# Patient Record
Sex: Female | Born: 1965
Health system: Southern US, Community
[De-identification: ages and names within clinical notes are randomized; demographics above are authoritative.]

## PROBLEM LIST (undated history)

## (undated) DIAGNOSIS — Z8489 Family history of other specified conditions: Secondary | ICD-10-CM

## (undated) DIAGNOSIS — Z87442 Personal history of urinary calculi: Secondary | ICD-10-CM

## (undated) DIAGNOSIS — Z801 Family history of malignant neoplasm of trachea, bronchus and lung: Secondary | ICD-10-CM

## (undated) DIAGNOSIS — M7989 Other specified soft tissue disorders: Secondary | ICD-10-CM

## (undated) DIAGNOSIS — Z8042 Family history of malignant neoplasm of prostate: Secondary | ICD-10-CM

## (undated) DIAGNOSIS — Z9221 Personal history of antineoplastic chemotherapy: Secondary | ICD-10-CM

## (undated) DIAGNOSIS — K829 Disease of gallbladder, unspecified: Secondary | ICD-10-CM

## (undated) DIAGNOSIS — Z808 Family history of malignant neoplasm of other organs or systems: Secondary | ICD-10-CM

## (undated) DIAGNOSIS — G459 Transient cerebral ischemic attack, unspecified: Secondary | ICD-10-CM

## (undated) DIAGNOSIS — R0602 Shortness of breath: Secondary | ICD-10-CM

## (undated) DIAGNOSIS — R519 Headache, unspecified: Secondary | ICD-10-CM

## (undated) DIAGNOSIS — M25559 Pain in unspecified hip: Secondary | ICD-10-CM

## (undated) DIAGNOSIS — Z8719 Personal history of other diseases of the digestive system: Secondary | ICD-10-CM

## (undated) DIAGNOSIS — Z803 Family history of malignant neoplasm of breast: Secondary | ICD-10-CM

## (undated) DIAGNOSIS — E7800 Pure hypercholesterolemia, unspecified: Secondary | ICD-10-CM

## (undated) DIAGNOSIS — M1990 Unspecified osteoarthritis, unspecified site: Secondary | ICD-10-CM

## (undated) DIAGNOSIS — Z972 Presence of dental prosthetic device (complete) (partial): Secondary | ICD-10-CM

## (undated) DIAGNOSIS — Z9889 Other specified postprocedural states: Secondary | ICD-10-CM

## (undated) DIAGNOSIS — R7303 Prediabetes: Secondary | ICD-10-CM

## (undated) DIAGNOSIS — G4733 Obstructive sleep apnea (adult) (pediatric): Secondary | ICD-10-CM

## (undated) DIAGNOSIS — Z800 Family history of malignant neoplasm of digestive organs: Secondary | ICD-10-CM

## (undated) DIAGNOSIS — K219 Gastro-esophageal reflux disease without esophagitis: Secondary | ICD-10-CM

## (undated) DIAGNOSIS — D649 Anemia, unspecified: Secondary | ICD-10-CM

## (undated) DIAGNOSIS — I10 Essential (primary) hypertension: Secondary | ICD-10-CM

## (undated) DIAGNOSIS — R112 Nausea with vomiting, unspecified: Secondary | ICD-10-CM

## (undated) HISTORY — DX: Disease of gallbladder, unspecified: K82.9

## (undated) HISTORY — DX: Prediabetes: R73.03

## (undated) HISTORY — DX: Shortness of breath: R06.02

## (undated) HISTORY — DX: Gastro-esophageal reflux disease without esophagitis: K21.9

## (undated) HISTORY — DX: Family history of malignant neoplasm of prostate: Z80.42

## (undated) HISTORY — DX: Essential (primary) hypertension: I10

## (undated) HISTORY — DX: Family history of malignant neoplasm of digestive organs: Z80.0

## (undated) HISTORY — DX: Family history of malignant neoplasm of breast: Z80.3

## (undated) HISTORY — DX: Family history of malignant neoplasm of other organs or systems: Z80.8

## (undated) HISTORY — DX: Anemia, unspecified: D64.9

## (undated) HISTORY — PX: JOINT REPLACEMENT: SHX530

## (undated) HISTORY — PX: EYE SURGERY: SHX253

## (undated) HISTORY — DX: Pain in unspecified hip: M25.559

## (undated) HISTORY — DX: Family history of malignant neoplasm of trachea, bronchus and lung: Z80.1

## (undated) HISTORY — PX: CARPAL TUNNEL RELEASE: SHX101

---

## 1998-10-01 HISTORY — PX: LASIK: SHX215

## 1999-10-02 DIAGNOSIS — S060X9A Concussion with loss of consciousness of unspecified duration, initial encounter: Secondary | ICD-10-CM

## 1999-10-02 DIAGNOSIS — S060XAA Concussion with loss of consciousness status unknown, initial encounter: Secondary | ICD-10-CM

## 1999-10-02 HISTORY — DX: Concussion with loss of consciousness status unknown, initial encounter: S06.0XAA

## 1999-10-02 HISTORY — DX: Concussion with loss of consciousness of unspecified duration, initial encounter: S06.0X9A

## 2001-04-14 ENCOUNTER — Ambulatory Visit (HOSPITAL_COMMUNITY): Admission: RE | Admit: 2001-04-14 | Discharge: 2001-04-14 | Payer: Self-pay | Admitting: Pulmonary Disease

## 2001-04-21 ENCOUNTER — Other Ambulatory Visit: Admission: RE | Admit: 2001-04-21 | Discharge: 2001-04-21 | Payer: Self-pay | Admitting: Dermatology

## 2009-03-12 ENCOUNTER — Emergency Department (HOSPITAL_COMMUNITY): Admission: EM | Admit: 2009-03-12 | Discharge: 2009-03-12 | Payer: Self-pay | Admitting: Emergency Medicine

## 2010-10-23 ENCOUNTER — Emergency Department (HOSPITAL_COMMUNITY)
Admission: EM | Admit: 2010-10-23 | Discharge: 2010-10-23 | Payer: Self-pay | Source: Home / Self Care | Admitting: Emergency Medicine

## 2010-10-24 LAB — URINALYSIS, ROUTINE W REFLEX MICROSCOPIC
Ketones, ur: NEGATIVE mg/dL
Leukocytes, UA: NEGATIVE
Urine Glucose, Fasting: NEGATIVE mg/dL
pH: 6 (ref 5.0–8.0)

## 2010-10-24 LAB — BASIC METABOLIC PANEL
Calcium: 8.9 mg/dL (ref 8.4–10.5)
GFR calc Af Amer: >60 mL/min (ref >60)
GFR calc non Af Amer: 57 mL/min — ABNORMAL LOW (ref >60)
Sodium: 138 mEq/L (ref 135–145)

## 2010-10-24 LAB — DIFFERENTIAL
Basophils Absolute: 0 10*3/uL (ref 0.0–0.1)
Basophils Relative: 0 % (ref 0–1)
Eosinophils Absolute: 0.1 10*3/uL (ref 0.0–0.7)
Eosinophils Relative: 1 % (ref 0–5)
Monocytes Absolute: 0.5 10*3/uL (ref 0.1–1.0)
Neutro Abs: 9.9 10*3/uL — ABNORMAL HIGH (ref 1.7–7.7)

## 2010-10-24 LAB — CBC
MCHC: 33 g/dL (ref 30.0–36.0)
Platelets: 236 10*3/uL (ref 150–400)
RDW: 15.4 % (ref 11.5–15.5)

## 2010-10-24 LAB — URINE MICROSCOPIC-ADD ON

## 2010-11-09 ENCOUNTER — Observation Stay (HOSPITAL_COMMUNITY)
Admission: AD | Admit: 2010-11-09 | Discharge: 2010-11-10 | DRG: 814 | Disposition: A | Discharge status: Home / Self Care | Payer: BC Managed Care – PPO | Source: Ambulatory Visit | Attending: General Surgery | Admitting: General Surgery

## 2010-11-09 DIAGNOSIS — R112 Nausea with vomiting, unspecified: Secondary | ICD-10-CM | POA: Diagnosis present

## 2010-11-09 DIAGNOSIS — R109 Unspecified abdominal pain: Principal | ICD-10-CM | POA: Diagnosis present

## 2010-11-09 LAB — HEPATIC FUNCTION PANEL
ALT: 13 U/L (ref 0–35)
AST: 17 U/L (ref 0–37)
Albumin: 3.7 g/dL (ref 3.5–5.2)
Bilirubin, Direct: 0.1 mg/dL (ref 0.0–0.3)

## 2010-11-09 LAB — DIFFERENTIAL
Basophils Absolute: 0 10*3/uL (ref 0.0–0.1)
Basophils Relative: 0 % (ref 0–1)
Lymphocytes Relative: 9 % — ABNORMAL LOW (ref 12–46)
Neutro Abs: 13.5 10*3/uL — ABNORMAL HIGH (ref 1.7–7.7)

## 2010-11-09 LAB — BASIC METABOLIC PANEL
CO2: 26 mEq/L (ref 19–32)
GFR calc Af Amer: >60 mL/min (ref >60)
GFR calc non Af Amer: 59 mL/min — ABNORMAL LOW (ref >60)
Glucose, Bld: 101 mg/dL — ABNORMAL HIGH (ref 70–99)
Potassium: 3.9 mEq/L (ref 3.5–5.1)
Sodium: 139 mEq/L (ref 135–145)

## 2010-11-09 LAB — CBC
HCT: 38.1 % (ref 36.0–46.0)
Hemoglobin: 12.9 g/dL (ref 12.0–15.0)
RDW: 15.2 % (ref 11.5–15.5)
WBC: 16.4 10*3/uL — ABNORMAL HIGH (ref 4.0–10.5)

## 2010-11-10 LAB — BASIC METABOLIC PANEL
BUN: 10 mg/dL (ref 6–23)
CO2: 25 mEq/L (ref 19–32)
Calcium: 8.8 mg/dL (ref 8.4–10.5)
Creatinine, Ser: 1.1 mg/dL (ref 0.4–1.2)
GFR calc non Af Amer: 54 mL/min — ABNORMAL LOW (ref >60)
Glucose, Bld: 81 mg/dL (ref 70–99)

## 2010-11-10 LAB — CBC
HCT: 33 % — ABNORMAL LOW (ref 36.0–46.0)
Hemoglobin: 11.2 g/dL — ABNORMAL LOW (ref 12.0–15.0)
MCH: 25.6 pg — ABNORMAL LOW (ref 26.0–34.0)
MCHC: 33.9 g/dL (ref 30.0–36.0)
MCV: 75.5 fL — ABNORMAL LOW (ref 78.0–100.0)
RDW: 15.5 % (ref 11.5–15.5)

## 2010-11-10 LAB — DIFFERENTIAL
Basophils Absolute: 0 10*3/uL (ref 0.0–0.1)
Eosinophils Relative: 2 % (ref 0–5)
Lymphocytes Relative: 26 % (ref 12–46)
Lymphs Abs: 2.6 10*3/uL (ref 0.7–4.0)
Monocytes Absolute: 1.1 10*3/uL — ABNORMAL HIGH (ref 0.1–1.0)
Monocytes Relative: 11 % (ref 3–12)
Neutro Abs: 6.1 10*3/uL (ref 1.7–7.7)

## 2010-12-28 ENCOUNTER — Other Ambulatory Visit: Payer: Self-pay | Admitting: General Surgery

## 2010-12-28 ENCOUNTER — Encounter (HOSPITAL_COMMUNITY): Payer: BC Managed Care – PPO

## 2010-12-28 LAB — CBC
Hemoglobin: 11.6 g/dL — ABNORMAL LOW (ref 12.0–15.0)
MCH: 24.9 pg — ABNORMAL LOW (ref 26.0–34.0)
MCHC: 31.8 g/dL (ref 30.0–36.0)
RDW: 14.7 % (ref 11.5–15.5)

## 2010-12-28 LAB — SURGICAL PCR SCREEN
MRSA, PCR: NEGATIVE
Staphylococcus aureus: NEGATIVE

## 2010-12-28 LAB — BASIC METABOLIC PANEL
CO2: 27 mEq/L (ref 19–32)
Calcium: 8.8 mg/dL (ref 8.4–10.5)
Creatinine, Ser: 0.96 mg/dL (ref 0.4–1.2)
GFR calc Af Amer: >60 mL/min (ref >60)
GFR calc non Af Amer: >60 mL/min (ref >60)
Sodium: 140 mEq/L (ref 135–145)

## 2010-12-31 HISTORY — PX: LAPAROSCOPIC CHOLECYSTECTOMY: SUR755

## 2011-01-03 ENCOUNTER — Ambulatory Visit (HOSPITAL_COMMUNITY)
Admission: RE | Admit: 2011-01-03 | Discharge: 2011-01-03 | Disposition: A | Discharge status: Home / Self Care | Payer: BC Managed Care – PPO | Source: Ambulatory Visit | Attending: General Surgery | Admitting: General Surgery

## 2011-01-03 ENCOUNTER — Other Ambulatory Visit: Payer: Self-pay | Admitting: General Surgery

## 2011-01-03 DIAGNOSIS — K8010 Calculus of gallbladder with chronic cholecystitis without obstruction: Secondary | ICD-10-CM | POA: Insufficient documentation

## 2011-01-08 LAB — URINE MICROSCOPIC-ADD ON

## 2011-01-08 LAB — URINALYSIS, ROUTINE W REFLEX MICROSCOPIC
Bilirubin Urine: NEGATIVE
Glucose, UA: NEGATIVE mg/dL
Specific Gravity, Urine: >1.03 — ABNORMAL HIGH (ref 1.005–1.030)
Urobilinogen, UA: 0.2 mg/dL (ref 0.0–1.0)

## 2011-01-09 NOTE — Op Note (Signed)
NAME:  Leslie Wright, Leslie Wright                ACCOUNT NO.:  [id number]  MEDICAL RECORD NO.:  [id number]           PATIENT TYPE:  O  LOCATION:  DAYP                          FACILITY:  [facility]  PHYSICIAN:  Tilford Pillar, MD      DATE OF BIRTH:  06/24/1966  DATE OF PROCEDURE:  01/03/2011 DATE OF DISCHARGE:                              OPERATIVE REPORT   PREOPERATIVE DIAGNOSIS:  Chronic cholecystitis.  POSTOPERATIVE DIAGNOSIS:  Hydrops of the gallbladder.  PROCEDURE:  Laparoscopic cholecystectomy.  SURGEON:  Tilford Pillar, MD.  ANESTHESIA:  General endotracheal, local anesthetic 0.5% Sensorcaine plain.  SPECIMEN:  Gallbladder.  ESTIMATED BLOOD LOSS:  Minimal.  INDICATIONS:  The patient is a 45 year old female, who presented to my office with history of epigastric and right upper quadrant abdominal pain.  Workup was consistent for cholecystitis.  After _____ the patient was scheduled for a laparoscopic and possible open cholecystectomy.Risks, benefits and alternatives were discussed at length with the patient including but not limited to risk of bleeding, infection, bile leak, small bowel injury, common bile duct injury as well as the possibility of intraoperative cardiac and pulmonary events.  Her questions and concerns were addressed and the patient was consented for planned procedure.  OPERATION:  The patient was taken to the operating room, placed in the supine position on the operating table, at which time, general anesthetic was administered.  Once the patient was asleep, she was endotracheally intubated by Anesthesia.  At this point, her abdomen was prepped with DuraPrep solution and draped in standard fashion.  At this point, a stab incision was created supraumbilically with a 11-blade scalpel.  Additional dissection down through the subcuticular tissue was carried out using a Kocher clamp, which was utilized to grasp the anterior abdominal fascia with this anteriorly.  A Veress  needle was inserted.  Saline drop test was utilized to confirm the intraperitoneal placement and then pneumoperitoneum was initiated.  Once sufficient pneumoperitoneum was obtained, an 11-mm trocar was inserted over the laparoscope allowing visualization of the trocar entering into the peritoneum cavity.  At this time, the inner cannula was removed.  The laparoscope was reinserted.  There was no evidence of any trocar or Veress needle placement injury.  At this time, the remaining trocars were placed with a 11-mm trocar in the epigastrium, the 5-mm trocar in the midline between the two 11-mm trocars, and a 5-mm trocar in the right lateral abdominal wall.  The patient was placed into a reverse Trendelenburg left lateral decubitus position.  The fundus of the gallbladder was grasped and lifted up and over the right lobe of the liver.  The infundibulum was identified.  The peritoneal reflection onto the infundibulum was bluntly stripped using a [place], which exposing the cystic duct and the cystic artery entering into theinfundibulum.  Three EndoClips were placed proximally, one distally on the cystic duct.  Two EndoClips were placed proximally, one distally, and the cystic artery and both structures were divided between the two most distal clips.  At this time, electrocautery was utilized to dissect the gallbladder free from the gallbladder fossa.  During the dissection,  I did identify the posterior branch of the cystic artery, this was additionally clipped with two EndoClips, and the gallbladder was freed and removed from the gallbladder fossa.  During the dissection, a small cholecystotomy was created which did demonstrate clear thick drainage from the gallbladder consistent with hydrops of the gallbladder.  This was quickly controlled and aspirated using a suction aspirator.  Once the gallbladder was free, it was placed into an EndoCatch bag.  I then copiously irrigated the  surgical field.  Hemostasis was excellent within the gallbladder fossa and at this time attention was turned to closure.  Using an Endoclose suture passing device, a 2-0 Vicryl suture was passed through both the 11-mm trocar sites with an Endoclose suture passing device.  With the sutures in place, the gallbladder was retrieved and removed through the umbilical trocar site.  Some blunt dilatation was required to enlarge the umbilical site large enough to remove the gallbladder.  The gallbladder was freed and then intact.  EndoCatch bag was placed on the back table and sent as a permanent specimen.  At this point, the pneumoperitoneum was evacuated.  The trocars were removed. The Vicryl sutures were secured.  The local anesthetic was instilled and all four trocar sites were closed with a 4-0 Monocryl and running subcuticular suture.  The skin was washed and dried with a moist and dry towel.  Benzoin was applied around the incision.  Half-inch Steri-Strips were placed.  The drapes removed and the patient was allowed to come out of general anesthetic and was transferred back to a regular hospital bed in stable condition.  At the conclusion of procedure, all instruments, sponge, and needle counts were correct.  The patient tolerated the procedure extremely well.     Tilford Pillar, MD     BZ/MEDQ  D:  01/03/2011  T:  01/04/2011  Job:  [id number]  Electronically Signed by Tilford Pillar MD on 01/09/2011 10:39:10 PM

## 2011-01-09 NOTE — H&P (Signed)
NAME:  Leslie Wright, Leslie Wright                ACCOUNT NO.:  [id number]  MEDICAL RECORD NO.:  [id number]           PATIENT TYPE:  O  LOCATION:  DAY                           FACILITY:  [facility]  PHYSICIAN:  Tilford Pillar, MD      DATE OF BIRTH:  1966-06-13  DATE OF ADMISSION: DATE OF DISCHARGE:  LH                             HISTORY & PHYSICAL   CHIEF COMPLAINT:  Right upper quadrant abdominal pain.  HISTORY OF PRESENT ILLNESS:  The patient is a 45 year old female who initially presented to my office from referral from [facility] primary care who has increasing episodes of nausea and vomiting.  She denies any fever or chills.  No diarrhea.  She stated she has bands of constant pressure, this is subcostal and orientation, it improves with emesis. She has had no change in bowel movements.  No melena.  No hematochezia. She has not described acholic stools.  She has had no change in urination.  She denies any jaundice.  No significant family history of biliary disease.  PAST MEDICAL HISTORY:  None.  PAST SURGICAL HISTORY:  STR procedure.  MEDICATIONS:  None.  ALLERGIES:  NO KNOWN DRUG ALLERGIES.  SOCIAL HISTORY:  No tobacco.  No alcohol.  No recreational drug abuse. 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  PERTINENT FAMILY HISTORY:  Diabetes mellitus, coronary artery disease.  REVIEW OF SYSTEMS:  Unremarkable and all systems other than gastrointestinal which is as per HPI, otherwise, Constitutional, Eyes, Ears, Nose, Throat, Respiratory, Cardiovascular, Genitourinary, Musculoskeletal, Skin, Endocrine, and Neuro are all unremarkable.  PHYSICAL EXAMINATION:  GENERAL:  The patient is an obese female.  She is comfortable in appearance, but is not in any acute distress.  She is alert and oriented x3. HEENT:  Scalp, no deformities or masses.  Eyes: Pupils equal, round, reactive to light.  Extraocular movements are intact.  No scleral icterus.  No  conjunctival pallor is noted.  Oral mucosa pink.  Normal occlusion. NECK:  Trachea is midline.  No thyroid nodularity or goiter.  No cervical lymphadenopathy. PULMONARY:  Unlabored respiration.  She is clear to auscultation bilaterally. CARDIOVASCULAR:  Regular rate and rhythm.  No murmurs, rubs, or gallop. She has 2+ radial femoral pulses bilaterally. ABDOMEN:  Positive bowel sounds.  Abdomen is soft.  She is moderately tender in the epigastrium.  No hernias or masses. SKIN:  Warm and dry.  PERTINENT LABORATORY AND RADIOGRAPHIC STUDIES:  CT of the abdomen and pelvis demonstrated positive stones within the biliary tree.  No biliary tree dilatation was noted.  ASSESSMENT/PLAN:  Cholelithiasis.  At this time, risks, benefits, and alternatives of the laparoscopic possible open cholecystectomy were discussed in length with the patient including, but not limited to risk of bleeding, infection, bile leak, small-bowel injury, common bile duct injury as well as the possibility of intraoperative cardiac and pulmonary events.  At this point, she will be scheduled at her earliest convenience.     Tilford Pillar, MD     BZ/MEDQ  D:  01/02/2011  T:  01/02/2011  Job:  [id number]  Electronically Signed by Tilford Pillar  MD on 01/09/2011 10:39:01 PM

## 2011-03-13 ENCOUNTER — Other Ambulatory Visit: Payer: Self-pay | Admitting: Obstetrics and Gynecology

## 2016-09-06 ENCOUNTER — Encounter (INDEPENDENT_AMBULATORY_CARE_PROVIDER_SITE_OTHER): Payer: Self-pay | Admitting: Family Medicine

## 2016-09-27 ENCOUNTER — Encounter (INDEPENDENT_AMBULATORY_CARE_PROVIDER_SITE_OTHER): Payer: Self-pay | Admitting: Family Medicine

## 2016-09-27 ENCOUNTER — Ambulatory Visit (INDEPENDENT_AMBULATORY_CARE_PROVIDER_SITE_OTHER): Payer: 59 | Admitting: Family Medicine

## 2016-09-27 VITALS — BP 135/87 | HR 64 | Temp 97.9°F | Resp 18 | Ht 66.0 in | Wt 287.6 lb

## 2016-09-27 DIAGNOSIS — R5383 Other fatigue: Secondary | ICD-10-CM

## 2016-09-27 DIAGNOSIS — R9431 Abnormal electrocardiogram [ECG] [EKG]: Secondary | ICD-10-CM

## 2016-09-27 DIAGNOSIS — R7303 Prediabetes: Secondary | ICD-10-CM

## 2016-09-27 DIAGNOSIS — Z1389 Encounter for screening for other disorder: Secondary | ICD-10-CM | POA: Diagnosis not present

## 2016-09-27 DIAGNOSIS — R0602 Shortness of breath: Secondary | ICD-10-CM | POA: Diagnosis not present

## 2016-09-27 DIAGNOSIS — Z1331 Encounter for screening for depression: Secondary | ICD-10-CM

## 2016-09-27 DIAGNOSIS — Z9189 Other specified personal risk factors, not elsewhere classified: Secondary | ICD-10-CM

## 2016-09-27 DIAGNOSIS — Z0289 Encounter for other administrative examinations: Secondary | ICD-10-CM

## 2016-09-27 NOTE — Progress Notes (Signed)
Office: [phone]  /  Fax: [phone]   HPI:   Chief Complaint: OBESITY  Leslie Wright (MR# [id number]) is a 50 y.o. female who presents on 09/27/2016 for obesity evaluation and treatment. Current BMI is Body mass index is 46.42 kg/m.Leslie Wright has struggled with obesity for years and has been unsuccessful in either losing weight or maintaining long term weight loss. Leslie Wright has lost and gained weight many times, felt she did best on HCG 500 calorie a day diet but has gained all her weight back. Legna attended our information session and states she is currently in the action stage of change and ready to dedicate time achieving and maintaining a healthier weight.  Analaya states her family eats meals together her desired weight is 180 she has been heavy most of  her life she started gaining weight last year. her heaviest weight ever was 312 lbs. she has significant food cravings issues  she is frequently drinking liquids with calories she frequently makes poor food choices she has problems with excessive hunger  she frequently eats larger portions than normal  she has binge eating behaviors she struggles with emotional eating    Fatigue with exertion Leslie Wright feels her energy is lower than it should be. This has worsened with weight gain and has not worsened recently. Leslie Wright admits to daytime somnolence and  admits to waking up still tired. Patient is at risk for obstructive sleep apnea. Patent has a history of symptoms of daytime fatigue and Epworth sleepiness scale. Patient generally gets 6 hours of sleep per night, and states they generally have generally restful sleep. Snoring is present. Apneic episodes are present. Epworth Sleepiness Score is 9.  Dyspnea on exertion Leslie Wright notes increasing shortness of breath with exercising and seems to be worsening over time with weight gain. She notes getting out of breath sooner with activity than she used to. This has not gotten worse  recently. Jaella denies orthopnea.  Pre-Diabetes Tomasa previously diagnosed with pre-diabetes and was informed this puts her at greater risk of developing diabetes. Positive family history of diabetes, no recent A1c. She is not taking metformin currently and continues to work on diet and exercise to decrease risk of diabetes. She denies nausea or hypoglycemia.  Abnormal EKG Poor R wave progression, may be secondary to pulmonary disease, Non Specific T-abnormality. Consider old anterior infarction.  Depression Screen Leslie Wright's Food and Mood (modified PHQ-9) score was  Depression screen PHQ 2/9 09/27/2016  Decreased Interest 3  Down, Depressed, Hopeless 3  PHQ - 2 Score 6  Altered sleeping 2  Tired, decreased energy 3  Change in appetite 3  Feeling bad or failure about yourself  2  Trouble concentrating 1  Moving slowly or fidgety/restless 0  Suicidal thoughts 0  PHQ-9 Score 17    ALLERGIES: Allergies not on file  MEDICATIONS: No current outpatient prescriptions on file prior to visit.   No current facility-administered medications on file prior to visit.     PAST MEDICAL HISTORY: Past Medical History:  Diagnosis Date  . Acid reflux   . Anemia   . Back pain   . Gallbladder problem   . High blood pressure   . Hip pain   . Pre-diabetes   . Shortness of breath   . Sleep apnea   . Swelling     PAST SURGICAL HISTORY: Past Surgical History:  Procedure Laterality Date  . CARPAL TUNNEL RELEASE    . GALLBLADDER SURGERY     April 2012  .  LASIK     2000    SOCIAL HISTORY: Social History  Substance Use Topics  . Smoking status: Never Smoker  . Smokeless tobacco: Never Used  . Alcohol use Not on file    FAMILY HISTORY: Family History  Problem Relation Age of Onset  . Diabetes Mother   . Thyroid disease Mother   . Liver disease Mother   . Obesity Mother   . Hypertension Father   . Hyperlipidemia Father   . Heart disease Father   . Stroke Father   .  Obesity Father     ROS: Review of Systems  Constitutional: Positive for malaise/fatigue.  HENT: Positive for congestion, hearing loss, sinus pain and tinnitus.   Eyes:       Vision Changes  Respiratory: Positive for shortness of breath and wheezing.   Cardiovascular: Positive for claudication.       Leg Cramping  Gastrointestinal: Positive for heartburn.       Difficulty Swallowing  Genitourinary: Positive for frequency.  Musculoskeletal: Positive for back pain, joint pain and myalgias.       Muscle Stiffness  Skin:       Hair or Nail Changes  Endo/Heme/Allergies: Positive for polydipsia.       Polyphagia    PHYSICAL EXAM: Blood pressure 135/87, pulse 64, temperature 97.9 F (36.6 C), temperature source Oral, resp. rate 18, height 5' 6"  (1.676 m), weight 287 lb 9.6 oz (130.5 kg), last menstrual period 09/12/2016, SpO2 98 %. Body mass index is 46.42 kg/m. Physical Exam  Constitutional: She is oriented to person, place, and time. She appears well-developed and well-nourished.  HENT:  Head: Normocephalic and atraumatic.  Nose: Nose normal.  Eyes: EOM are normal. No scleral icterus.  Neck: Normal range of motion. Neck supple. No thyromegaly present.  Cardiovascular: Normal rate and regular rhythm.   Pulmonary/Chest: No respiratory distress.  Abdominal: Soft. There is no tenderness.  Obesity  Musculoskeletal: Normal range of motion. She exhibits edema.  Neurological: She is alert and oriented to person, place, and time. Coordination normal.  Skin: Skin is warm and dry.  Psychiatric: She has a normal mood and affect. Her behavior is normal.    RECENT LABS AND TESTS: BMET    Component Value Date/Time   NA 140 12/28/2010 1530   K 4.5 12/28/2010 1530   CL 105 12/28/2010 1530   CO2 27 12/28/2010 1530   GLUCOSE 94 12/28/2010 1530   BUN 6 12/28/2010 1530   CREATININE 0.96 12/28/2010 1530   CALCIUM 8.8 12/28/2010 1530   GFRNONAA >60 12/28/2010 1530   GFRAA  12/28/2010  1530    >60        The eGFR has been calculated using the MDRD equation. This calculation has not been validated in all clinical situations. eGFR's persistently <60 mL/min signify possible Chronic Kidney Disease.   No results found for: HGBA1C No results found for: INSULIN CBC    Component Value Date/Time   WBC 9.1 12/28/2010 1530   RBC 4.66 12/28/2010 1530   HGB 11.6 (L) 12/28/2010 1530   HCT 36.5 12/28/2010 1530   PLT 193 12/28/2010 1530   MCV 78.3 12/28/2010 1530   MCH 24.9 (L) 12/28/2010 1530   MCHC 31.8 12/28/2010 1530   RDW 14.7 12/28/2010 1530   LYMPHSABS 2.6 11/10/2010 0554   MONOABS 1.1 (H) 11/10/2010 0554   EOSABS 0.2 11/10/2010 0554   BASOSABS 0.0 11/10/2010 0554   Iron/TIBC/Ferritin/ %Sat No results found for: IRON, TIBC, FERRITIN, IRONPCTSAT Lipid  Panel  No results found for: CHOL, TRIG, HDL, CHOLHDL, VLDL, LDLCALC, LDLDIRECT Hepatic Function Panel     Component Value Date/Time   PROT 7.5 11/09/2010 1645   ALBUMIN 3.7 11/09/2010 1645   AST 17 11/09/2010 1645   ALT 13 11/09/2010 1645   ALKPHOS 55 11/09/2010 1645   BILITOT 0.5 11/09/2010 1645   BILIDIR 0.1 11/09/2010 1645   IBILI 0.4 11/09/2010 1645   No results found for: TSH  ECG  shows NSR with a rate of 64 INDIRECT CALORIMETER done today shows a VO2 of 229 and a REE of 1597.    ASSESSMENT AND PLAN: Other fatigue - Plan: EKG 12-Lead, Comprehensive metabolic panel, CBC With Differential, Lipid Panel With LDL/HDL Ratio, VITAMIN D 25 Hydroxy (Vit-D Deficiency, Fractures), Vitamin B12, Folate, T3, T4, free, TSH  Shortness of breath on exertion  Prediabetes - Plan: Hemoglobin A1c, Insulin, random  Nonspecific abnormal electrocardiogram (ECG) (EKG) - Plan: EKG 12-Lead, ECHOCARDIOGRAM COMPLETE  At risk for heart disease  Depression screening  Morbid obesity ([place])  PLAN:  Fatigue Avonelle was informed that her fatigue may be related to obesity, depression or many other causes. Labs will be  ordered, and in the meanwhile Kevonna has agreed to work on diet, exercise and weight loss to help with fatigue. Proper sleep hygiene was discussed including the need for 7-8 hours of quality sleep each night. A sleep study was not ordered based on symptoms and Epworth score.  Exercise Induced Shortness of Breath Candid's shortness of breath appears to be obesity related and exercise induced. She has agreed to work on weight loss and gradually increase exercise to treat her exercise induced shortness of breath. If Moira follows our instructions and loses weight without improvement of her shortness of breath, we will plan to refer to pulmonology. We will monitor this condition regularly. Kaniah agrees to this plan.   Pre-Diabetes Rossy will continue to work on weight loss, exercise, and decreasing simple carbohydrates in her diet to help decrease the risk of diabetes. We dicussed metformin including benefits and risks. She was informed that eating too many simple carbohydrates or too many calories at one sitting increases the likelihood of GI side effects. Ellary agreed to follow up with [place] as directed to monitor her progress. Will check labs.  Cardiovascular risk counselling Payal was given extended (at least 15 minutes) coronary artery disease prevention counseling today. She is 50 y.o. female and has risk factors for heart disease including obesity. We discussed intensive lifestyle modifications today with an emphasis on specific weight loss instructions and strategies. Pt was also informed of the importance of increasing exercise and decreasing saturated fats to help prevent heart disease.  Abnormal EKG Referral to [facility], appointment 10/24/16 8:00am.  Depression Screen Kindel had a positive depression screening. Depression is commonly associated with obesity and often results in emotional eating behaviors. We will monitor this closely and work on CBT to help improve the non-hunger  eating patterns. Referral to Psychology may be required if no improvement is seen as she continues in our clinic.  Obesity Jasmin is currently in the action stage of change and her goal is to continue with weight loss efforts She has agreed to follow the Category 2 plan +100 calories Keasia has been instructed to work up to a goal of 150 minutes of combined cardio and strengthening exercise per week for weight loss and overall health benefits. We discussed the following Behavioral Modification Stratagies today: increasing lean protein intake, decreasing simple  carbohydrates , work on meal planning and easy cooking plans and holiday eating strategies   Holle has agreed to follow up with our clinic in 2 weeks. She was informed of the importance of frequent follow up visits to maximize her success with intensive lifestyle modifications for her multiple health conditions. She was informed we would discuss her lab results at her next visit unless there is a critical issue that needs to be addressed sooner. Keionna agreed to keep her next visit at the agreed upon time to discuss these results.  I, Doreene Nest, am acting as scribe for Dennard Nip, MD  I have reviewed the above documentation for accuracy and completeness, and I agree with the above. -Dennard Nip, MD

## 2016-09-28 LAB — CBC WITH DIFFERENTIAL
BASOS ABS: 0 10*3/uL (ref 0.0–0.2)
Basos: 0 %
EOS (ABSOLUTE): 0.6 10*3/uL — ABNORMAL HIGH (ref 0.0–0.4)
EOS: 7 %
HEMATOCRIT: 40.9 % (ref 34.0–46.6)
HEMOGLOBIN: 13.4 g/dL (ref 11.1–15.9)
IMMATURE GRANULOCYTES: 1 %
Immature Grans (Abs): 0.1 10*3/uL (ref 0.0–0.1)
LYMPHS ABS: 2.3 10*3/uL (ref 0.7–3.1)
LYMPHS: 28 %
MCH: 27.2 pg (ref 26.6–33.0)
MCHC: 32.8 g/dL (ref 31.5–35.7)
MCV: 83 fL (ref 79–97)
MONOCYTES: 8 %
Monocytes Absolute: 0.7 10*3/uL (ref 0.1–0.9)
Neutrophils Absolute: 4.7 10*3/uL (ref 1.4–7.0)
Neutrophils: 56 %
RBC: 4.93 x10E6/uL (ref 3.77–5.28)
RDW: 15.9 % — ABNORMAL HIGH (ref 12.3–15.4)
WBC: 8.3 10*3/uL (ref 3.4–10.8)

## 2016-09-28 LAB — HEMOGLOBIN A1C
ESTIMATED AVERAGE GLUCOSE: 111 mg/dL
HEMOGLOBIN A1C: 5.5 % (ref 4.8–5.6)

## 2016-09-28 LAB — COMPREHENSIVE METABOLIC PANEL
A/G RATIO: 1.4 (ref 1.2–2.2)
ALK PHOS: 76 IU/L (ref 39–117)
ALT: 15 IU/L (ref 0–32)
AST: 16 IU/L (ref 0–40)
Albumin: 4.1 g/dL (ref 3.5–5.5)
BUN/Creatinine Ratio: 13 (ref 9–23)
BUN: 14 mg/dL (ref 6–24)
CHLORIDE: 103 mmol/L (ref 96–106)
CO2: 25 mmol/L (ref 18–29)
Calcium: 9.1 mg/dL (ref 8.7–10.2)
Creatinine, Ser: 1.12 mg/dL — ABNORMAL HIGH (ref 0.57–1.00)
GFR calc Af Amer: 66 mL/min/{1.73_m2} (ref >59)
GFR calc non Af Amer: 57 mL/min/{1.73_m2} — ABNORMAL LOW (ref >59)
GLUCOSE: 82 mg/dL (ref 65–99)
Globulin, Total: 2.9 g/dL (ref 1.5–4.5)
POTASSIUM: 4.6 mmol/L (ref 3.5–5.2)
Sodium: 144 mmol/L (ref 134–144)
TOTAL PROTEIN: 7 g/dL (ref 6.0–8.5)

## 2016-09-28 LAB — LIPID PANEL WITH LDL/HDL RATIO
Cholesterol, Total: 226 mg/dL — ABNORMAL HIGH (ref 100–199)
HDL: 49 mg/dL (ref >39)
LDL Calculated: 140 mg/dL — ABNORMAL HIGH (ref 0–99)
LDL/HDL RATIO: 2.9 ratio (ref 0.0–3.2)
TRIGLYCERIDES: 184 mg/dL — AB (ref 0–149)
VLDL Cholesterol Cal: 37 mg/dL (ref 5–40)

## 2016-09-28 LAB — INSULIN, RANDOM: INSULIN: 20 u[IU]/mL (ref 2.6–24.9)

## 2016-09-28 LAB — VITAMIN B12: Vitamin B-12: 761 pg/mL (ref 232–1245)

## 2016-09-28 LAB — TSH: TSH: 1.89 u[IU]/mL (ref 0.450–4.500)

## 2016-09-28 LAB — T3: T3 TOTAL: 118 ng/dL (ref 71–180)

## 2016-09-28 LAB — T4, FREE: FREE T4: 1 ng/dL (ref 0.82–1.77)

## 2016-09-28 LAB — FOLATE: FOLATE: 5.6 ng/mL (ref >3.0)

## 2016-09-28 LAB — VITAMIN D 25 HYDROXY (VIT D DEFICIENCY, FRACTURES): VIT D 25 HYDROXY: 17.1 ng/mL — AB (ref 30.0–100.0)

## 2016-10-02 ENCOUNTER — Telehealth (INDEPENDENT_AMBULATORY_CARE_PROVIDER_SITE_OTHER): Payer: Self-pay

## 2016-10-02 NOTE — Telephone Encounter (Signed)
Spoke with the pt and confirmed that she is aware of her Echo on 1/24. Patient states she seen the appointment on her my chart.

## 2016-10-10 ENCOUNTER — Ambulatory Visit (INDEPENDENT_AMBULATORY_CARE_PROVIDER_SITE_OTHER): Payer: 59 | Admitting: Family Medicine

## 2016-10-10 VITALS — BP 142/82 | HR 61 | Temp 98.5°F | Wt 283.0 lb

## 2016-10-10 DIAGNOSIS — Z9189 Other specified personal risk factors, not elsewhere classified: Secondary | ICD-10-CM | POA: Diagnosis not present

## 2016-10-10 DIAGNOSIS — E559 Vitamin D deficiency, unspecified: Secondary | ICD-10-CM | POA: Insufficient documentation

## 2016-10-10 DIAGNOSIS — E8881 Metabolic syndrome: Secondary | ICD-10-CM | POA: Diagnosis not present

## 2016-10-10 DIAGNOSIS — E785 Hyperlipidemia, unspecified: Secondary | ICD-10-CM | POA: Insufficient documentation

## 2016-10-10 DIAGNOSIS — E7800 Pure hypercholesterolemia, unspecified: Secondary | ICD-10-CM

## 2016-10-10 NOTE — Progress Notes (Signed)
Office: [phone]  /  Fax: [phone]   HPI:   Chief Complaint: OBESITY Leslie Wright is here to discuss her progress with her obesity treatment plan. She is following her eating plan approximately 70 % of the time and states she is exercising 0 minutes 0 times per week. Leslie Wright is currently struggling with increase in cravings.  Her weight is 283 lb (128.4 kg) today and has had a weight loss of 4 pounds over a period of 2 weeks since her last visit. She has lost 4 lbs since starting treatment with [place]. Patient did well with weight loss and notes an increase in cravings especially the second half of the day. She also did well with meal planning.  Insulin Resistance Leslie Wright has a diagnosis of insulin resistance based on her elevated fasting insulin level >5. Although Brady's blood glucose readings are still under good control, insulin resistance puts her at greater risk of metabolic syndrome and diabetes. She is not taking metformin currently and continues to work on diet and exercise to decrease risk of diabetes. She is positive for polyphagia.   Vitamin D deficiency Leslie Wright has a diagnosis of vitamin D deficiency. She is not currently taking vit D and denies nausea, vomiting or muscle weakness. She is positive for fatigue.   Hyperlipidemia Leslie Wright has hyperlipidemia and has been trying to improve her cholesterol levels with intensive lifestyle modification including a low saturated fat diet, exercise and weight loss. She denies any chest pain, claudication or myalgias. She denies chest pain, or abdominal pain. She has a increase in triglycerides.    At risk for diabetes Leslie Wright is at higher than averagerisk for developing diabetes due to her obesity. She currently denies polyuria or polydipsia.     Wt Readings from Last 500 Encounters:  10/10/16 283 lb (128.4 kg)  09/27/16 287 lb 9.6 oz (130.5 kg)     ALLERGIES: Not on File  MEDICATIONS: Current Outpatient Prescriptions on File  Prior to Visit  Medication Sig Dispense Refill  . ibuprofen (ADVIL,MOTRIN) 200 MG tablet Take 200 mg by mouth 4 (four) times daily.    . Multiple Vitamin (MULTIVITAMIN) tablet Take 1 tablet by mouth daily.     No current facility-administered medications on file prior to visit.     PAST MEDICAL HISTORY: Past Medical History:  Diagnosis Date  . Acid reflux   . Anemia   . Back pain   . Gallbladder problem   . High blood pressure   . Hip pain   . Pre-diabetes   . Shortness of breath   . Sleep apnea   . Swelling     PAST SURGICAL HISTORY: Past Surgical History:  Procedure Laterality Date  . CARPAL TUNNEL RELEASE    . GALLBLADDER SURGERY     April 2012  . LASIK     2000    SOCIAL HISTORY: Social History  Substance Use Topics  . Smoking status: Never Smoker  . Smokeless tobacco: Never Used  . Alcohol use Not on file    FAMILY HISTORY: Family History  Problem Relation Age of Onset  . Diabetes Mother   . Thyroid disease Mother   . Liver disease Mother   . Obesity Mother   . Hypertension Father   . Hyperlipidemia Father   . Heart disease Father   . Stroke Father   . Obesity Father     ROS: Review of Systems  Constitutional: Positive for malaise/fatigue and weight loss.  Endo/Heme/Allergies:  Polyphagia  All other systems reviewed and are negative.   PHYSICAL EXAM: Blood pressure (!) 142/82, pulse 61, temperature 98.5 F (36.9 C), temperature source Oral, height 5\' 6"  (1.676 m), weight 283 lb (128.4 kg), last menstrual period 09/12/2016, SpO2 98 %. Body mass index is 45.68 kg/m. Physical Exam  Constitutional: She is oriented to person, place, and time. She appears well-developed and well-nourished.  Cardiovascular: Normal rate.   Pulmonary/Chest: Effort normal.  Musculoskeletal: Normal range of motion.  Neurological: She is oriented to person, place, and time.  Skin: Skin is warm and dry.  Psychiatric: She has a normal mood and affect. Her  behavior is normal.  Vitals reviewed.   RECENT LABS AND TESTS: BMET    Component Value Date/Time   NA 144 09/27/2016 1342   K 4.6 09/27/2016 1342   CL 103 09/27/2016 1342   CO2 25 09/27/2016 1342   GLUCOSE 82 09/27/2016 1342   GLUCOSE 94 12/28/2010 1530   BUN 14 09/27/2016 1342   CREATININE 1.12 (H) 09/27/2016 1342   CALCIUM 9.1 09/27/2016 1342   GFRNONAA 57 (L) 09/27/2016 1342   GFRAA 66 09/27/2016 1342   Lab Results  Component Value Date   HGBA1C 5.5 09/27/2016   Lab Results  Component Value Date   INSULIN 20.0 09/27/2016   CBC    Component Value Date/Time   WBC 8.3 09/27/2016 1342   WBC 9.1 12/28/2010 1530   RBC 4.93 09/27/2016 1342   RBC 4.66 12/28/2010 1530   HGB 11.6 (L) 12/28/2010 1530   HCT 40.9 09/27/2016 1342   PLT 193 12/28/2010 1530   MCV 83 09/27/2016 1342   MCH 27.2 09/27/2016 1342   MCH 24.9 (L) 12/28/2010 1530   MCHC 32.8 09/27/2016 1342   MCHC 31.8 12/28/2010 1530   RDW 15.9 (H) 09/27/2016 1342   LYMPHSABS 2.3 09/27/2016 1342   MONOABS 1.1 (H) 11/10/2010 0554   EOSABS 0.6 (H) 09/27/2016 1342   BASOSABS 0.0 09/27/2016 1342   Iron/TIBC/Ferritin/ %Sat No results found for: IRON, TIBC, FERRITIN, IRONPCTSAT Lipid Panel     Component Value Date/Time   CHOL 226 (H) 09/27/2016 1342   TRIG 184 (H) 09/27/2016 1342   HDL 49 09/27/2016 1342   LDLCALC 140 (H) 09/27/2016 1342   Hepatic Function Panel     Component Value Date/Time   PROT 7.0 09/27/2016 1342   ALBUMIN 4.1 09/27/2016 1342   AST 16 09/27/2016 1342   ALT 15 09/27/2016 1342   ALKPHOS 76 09/27/2016 1342   BILITOT <0.2 09/27/2016 1342   BILIDIR 0.1 11/09/2010 1645   IBILI 0.4 11/09/2010 1645      Component Value Date/Time   TSH 1.890 09/27/2016 1342    ASSESSMENT AND PLAN: Insulin resistance - Plan: metFORMIN (GLUCOPHAGE) 500 MG tablet  Vitamin D deficiency - Plan: Vitamin D, Ergocalciferol, (DRISDOL) 50000 units CAPS capsule  Pure hypercholesterolemia  At risk for  diabetes mellitus  Morbid obesity ([place])  PLAN:  Insulin Resistance Layney will continue to work on weight loss, exercise, and decreasing simple carbohydrates in her diet to help decrease the risk of diabetes. We dicussed metformin including benefits and risks. She was informed that eating too many simple carbohydrates or too many calories at one sitting increases the likelihood of GI side effects. Erlinda requested metformin for now and prescription was written today with a quantity of 30 and 0 refills. Shellyann agreed to follow up with [place] as directed to monitor her progress. She agreed to incorporate diet/exercise as well.  We will recheck her labs in 3 months.     Vitamin D Deficiency Pashance was informed that low vitamin D levels contributes to fatigue and are associated with obesity, breast, and colon cancer. She agrees to continue to take prescription Vit D @50 ,000 IU every week and will follow up for routine testing of vitamin D, at least 2-3 times per year. She was informed of the risk of over-replacement of vitamin D and agrees to not increase her dose unless he discusses this with [place] first. A new prescription was sent off to her pharmacy with a quantity of 4 and 0 refills. Will recheck her labs in 3 months.   Hyperlipidemia Sherby was informed of the American Heart Association Guidelines emphasize intensive lifestyle modifications as the first line treatment for hyperlipidemia. We discussed many lifestyle modifications today in depth, and Somya will continue to work on decreasing saturated fats such as fatty red meat, butter and many fried foods. She will also increase vegetables and lean protein in her diet and continue to work on exercise and her weight loss efforts. Patient agreed to diet and exercise. Will recheck her labs in 3 months.   Diabetes risk counselling Saniah was given extended (at least 15 minutes) diabetes prevention counseling today. She is 52 y.o. female and has risk factors  for diabetes including obesity. We discussed intensive lifestyle modifications today with an emphasis on weight loss as well as increasing exercise and decreasing simple carbohydrates in her diet.   Obesity Kathren is currently in the action stage of change. As such, her goal is to continue with weight loss efforts She has agreed to follow the Category 3 plan Marshelle has been instructed to work up to a goal of 150 minutes of combined cardio and strengthening exercise per week for weight loss and overall health benefits. We discussed the following Behavioral Modification Stratagies today: increasing lean protein intake, decreasing simple carbohydrates  and work on meal planning and easy cooking plans  Lourdes has agreed to follow up with our clinic in 2 weeks. She was informed of the importance of frequent follow up visits to maximize her success with intensive lifestyle modifications for her multiple health conditions.  I, April Moore, am acting as Education administrator for Dennard Nip, MD  I have reviewed the above documentation for accuracy and completeness, and I agree with the above. -Dennard Nip, MD

## 2016-10-11 ENCOUNTER — Encounter (INDEPENDENT_AMBULATORY_CARE_PROVIDER_SITE_OTHER): Payer: Self-pay | Admitting: Family Medicine

## 2016-10-11 MED ORDER — METFORMIN HCL 500 MG PO TABS: 500.0000 mg | ORAL_TABLET | Freq: Every day | ORAL | 0 refills | Status: DC

## 2016-10-11 MED ORDER — VITAMIN D (ERGOCALCIFEROL) 1.25 MG (50000 UNIT) PO CAPS: 50000.0000 [IU] | ORAL_CAPSULE | ORAL | 0 refills | Status: DC

## 2016-10-24 ENCOUNTER — Other Ambulatory Visit: Payer: Self-pay

## 2016-10-24 ENCOUNTER — Ambulatory Visit (INDEPENDENT_AMBULATORY_CARE_PROVIDER_SITE_OTHER): Payer: 59

## 2016-10-24 DIAGNOSIS — R9431 Abnormal electrocardiogram [ECG] [EKG]: Secondary | ICD-10-CM

## 2016-10-24 LAB — ECHOCARDIOGRAM COMPLETE
CHL CUP DOP CALC LVOT VTI: 23 cm
CHL CUP MV DEC (S): 222
CHL CUP RV SYS PRESS: 13 mmHg
CHL CUP TV REG PEAK VELOCITY: 157 cm/s
E decel time: 222 msec
E/e' ratio: 6.41
FS: 31 % (ref 28–44)
IV/PV OW: 0.79
LA ID, A-P, ES: 37 mm
LA diam index: 1.59 cm/m2
LAVOLA4C: 56.1 mL
LDCA: 3.14 cm2
LEFT ATRIUM END SYS DIAM: 37 mm
LV E/e' medial: 6.41
LV E/e'average: 6.41
LV dias vol: 50 mL (ref 46–106)
LV e' LATERAL: 10.5 cm/s
LVDIAVOLIN: 21 mL/m2
LVOT diameter: 20 mm
LVOTPV: 102 cm/s
LVOTSV: 72 mL
LVSYSVOL: 18 mL (ref 14–42)
LVSYSVOLIN: 8 mL/m2
Lateral S' vel: 12.9 cm/s
MV pk A vel: 80.8 m/s
MV pk E vel: 67.3 m/s
PW: 10.9 mm — AB (ref 0.6–1.1)
RV TAPSE: 25.9 mm
Simpson's disk: 65
Stroke v: 32 ml
TDI e' lateral: 10.5
TDI e' medial: 10.4
TR max vel: 157 cm/s

## 2016-10-25 ENCOUNTER — Encounter (INDEPENDENT_AMBULATORY_CARE_PROVIDER_SITE_OTHER): Payer: Self-pay | Admitting: Family Medicine

## 2016-10-25 ENCOUNTER — Ambulatory Visit (INDEPENDENT_AMBULATORY_CARE_PROVIDER_SITE_OTHER): Payer: 59 | Admitting: Family Medicine

## 2016-10-25 VITALS — BP 147/90 | HR 49 | Temp 98.1°F | Ht 66.0 in | Wt 278.0 lb

## 2016-10-25 DIAGNOSIS — E8881 Metabolic syndrome: Secondary | ICD-10-CM | POA: Diagnosis not present

## 2016-10-25 DIAGNOSIS — I10 Essential (primary) hypertension: Secondary | ICD-10-CM | POA: Diagnosis not present

## 2016-10-25 DIAGNOSIS — Z6841 Body Mass Index (BMI) 40.0 and over, adult: Secondary | ICD-10-CM | POA: Insufficient documentation

## 2016-10-25 DIAGNOSIS — Z9189 Other specified personal risk factors, not elsewhere classified: Secondary | ICD-10-CM

## 2016-10-25 DIAGNOSIS — E559 Vitamin D deficiency, unspecified: Secondary | ICD-10-CM | POA: Insufficient documentation

## 2016-10-25 MED ORDER — VITAMIN D (ERGOCALCIFEROL) 1.25 MG (50000 UNIT) PO CAPS: 50000.0000 [IU] | ORAL_CAPSULE | ORAL | 0 refills | Status: DC

## 2016-10-25 MED ORDER — HYDROCHLOROTHIAZIDE 12.5 MG PO TABS: 12.5000 mg | ORAL_TABLET | Freq: Every day | ORAL | 0 refills | Status: DC

## 2016-10-25 MED ORDER — METFORMIN HCL 500 MG PO TABS: 500.0000 mg | ORAL_TABLET | Freq: Every day | ORAL | 0 refills | Status: DC

## 2016-10-26 MED FILL — HYDROCHLOROTHIAZIDE 12.5 MG: 12.5 | 30 days supply | Qty: 30 | Fill #0

## 2016-10-26 MED FILL — VIT D2 1.25 MG (50,000 UNIT: 1.25 MG | 28 days supply | Qty: 4 | Fill #0

## 2016-10-26 MED FILL — metFORMIN HCL 500 MG TABS: 500 | 30 days supply | Qty: 30 | Fill #0

## 2016-10-29 NOTE — Progress Notes (Signed)
Office: [phone]  /  Fax: [phone]   HPI:   Chief Complaint: OBESITY Leslie Wright is here to discuss her progress with her obesity treatment plan. She is following her eating plan approximately 85 % of the time and states she is exercising 0 minutes 0 times per week. She continues to do well with weight loss. Leslie Wright is currently struggling with deviating from the plan on the weekends. States she would like more options.  Her weight is 278 lb (126.1 kg) today and has had a weight loss of 5 pounds over a period of 2 weeks since her last visit. She has lost 9 lbs since starting treatment with [place].  Hypertension Leslie Wright is a 51 y.o. female with hypertension. Her blood pressure has been elevated at 2 visits in a row.  Leslie Wright denies headache, chest pain or shortness of breath on exertion. She is working weight loss to help control her blood pressure with the goal of decreasing her risk of heart attack and stroke. Leslie Wright blood pressure is not currently controlled.  Stage I Diastolic Dysfunction Leslie Wright has a new diagnosis of Stage I diastolic dysfunction on recent echocardiogram January 24th. She is asymptomatic.  At risk for cardiovascular disease Leslie Wright is at a higher than average risk for cardiovascular disease due to obesity. She currently denies any chest pain.  Vitamin D deficiency Leslie Wright has a diagnosis of vitamin D deficiency. She is not yet at goal. She is not currently taking vit D, due to pharmacy error with prescription and denies nausea, vomiting or muscle weakness.  Insulin Resistance Leslie Wright has a diagnosis of insulin resistance based on her elevated fasting insulin level >5. Although Leslie Wright blood glucose readings are still under good control, insulin resistance puts her at greater risk of metabolic syndrome and diabetes. She is not taking metformin currently and continues to work on diet and exercise to decrease risk of diabetes.   Wt Readings from Last 500  Encounters:  10/25/16 278 lb (126.1 kg)  10/10/16 283 lb (128.4 kg)  09/27/16 287 lb 9.6 oz (130.5 kg)     ALLERGIES: Not on File  MEDICATIONS: Current Outpatient Prescriptions on File Prior to Visit  Medication Sig Dispense Refill  . ibuprofen (ADVIL,MOTRIN) 200 MG tablet Take 200 mg by mouth 4 (four) times daily.    . Multiple Vitamin (MULTIVITAMIN) tablet Take 1 tablet by mouth daily.     No current facility-administered medications on file prior to visit.     PAST MEDICAL HISTORY: Past Medical History:  Diagnosis Date  . Acid reflux   . Anemia   . Back pain   . Gallbladder problem   . High blood pressure   . Hip pain   . Pre-diabetes   . Shortness of breath   . Sleep apnea   . Swelling     PAST SURGICAL HISTORY: Past Surgical History:  Procedure Laterality Date  . CARPAL TUNNEL RELEASE    . GALLBLADDER SURGERY     April 2012  . LASIK     2000    SOCIAL HISTORY: Social History  Substance Use Topics  . Smoking status: Never Smoker  . Smokeless tobacco: Never Used  . Alcohol use Not on file    FAMILY HISTORY: Family History  Problem Relation Age of Onset  . Diabetes Mother   . Thyroid disease Mother   . Liver disease Mother   . Obesity Mother   . Hypertension Father   . Hyperlipidemia Father   .  Heart disease Father   . Stroke Father   . Obesity Father     ROS: Review of Systems  Constitutional: Positive for weight loss.  Respiratory: Negative for shortness of breath (negative shortness of breath on exertion).   Cardiovascular: Negative for chest pain.  Gastrointestinal: Negative for nausea and vomiting.  Musculoskeletal:       Negative muscle weakness  Neurological: Negative for headaches.    PHYSICAL EXAM: Blood pressure (!) 147/90, pulse (!) 49, temperature 98.1 F (36.7 C), temperature source Oral, height 5\' 6"  (1.676 m), weight 278 lb (126.1 kg), SpO2 97 %. Body mass index is 44.87 kg/m. Physical Exam  Constitutional: She is  oriented to person, place, and time. She appears well-developed and well-nourished.  Cardiovascular: Normal rate.   Pulmonary/Chest: Effort normal.  Musculoskeletal: Normal range of motion.  Neurological: She is oriented to person, place, and time.  Skin: Skin is warm and dry.  Psychiatric: She has a normal mood and affect. Her behavior is normal.    RECENT LABS AND TESTS: BMET    Component Value Date/Time   NA 144 09/27/2016 1342   K 4.6 09/27/2016 1342   CL 103 09/27/2016 1342   CO2 25 09/27/2016 1342   GLUCOSE 82 09/27/2016 1342   GLUCOSE 94 12/28/2010 1530   BUN 14 09/27/2016 1342   CREATININE 1.12 (H) 09/27/2016 1342   CALCIUM 9.1 09/27/2016 1342   GFRNONAA 57 (L) 09/27/2016 1342   GFRAA 66 09/27/2016 1342   Lab Results  Component Value Date   HGBA1C 5.5 09/27/2016   Lab Results  Component Value Date   INSULIN 20.0 09/27/2016   CBC    Component Value Date/Time   WBC 8.3 09/27/2016 1342   WBC 9.1 12/28/2010 1530   RBC 4.93 09/27/2016 1342   RBC 4.66 12/28/2010 1530   HGB 11.6 (L) 12/28/2010 1530   HCT 40.9 09/27/2016 1342   PLT 193 12/28/2010 1530   MCV 83 09/27/2016 1342   MCH 27.2 09/27/2016 1342   MCH 24.9 (L) 12/28/2010 1530   MCHC 32.8 09/27/2016 1342   MCHC 31.8 12/28/2010 1530   RDW 15.9 (H) 09/27/2016 1342   LYMPHSABS 2.3 09/27/2016 1342   MONOABS 1.1 (H) 11/10/2010 0554   EOSABS 0.6 (H) 09/27/2016 1342   BASOSABS 0.0 09/27/2016 1342   Iron/TIBC/Ferritin/ %Sat No results found for: IRON, TIBC, FERRITIN, IRONPCTSAT Lipid Panel     Component Value Date/Time   CHOL 226 (H) 09/27/2016 1342   TRIG 184 (H) 09/27/2016 1342   HDL 49 09/27/2016 1342   LDLCALC 140 (H) 09/27/2016 1342   Hepatic Function Panel     Component Value Date/Time   PROT 7.0 09/27/2016 1342   ALBUMIN 4.1 09/27/2016 1342   AST 16 09/27/2016 1342   ALT 15 09/27/2016 1342   ALKPHOS 76 09/27/2016 1342   BILITOT <0.2 09/27/2016 1342   BILIDIR 0.1 11/09/2010 1645   IBILI  0.4 11/09/2010 1645      Component Value Date/Time   TSH 1.890 09/27/2016 1342    ASSESSMENT AND PLAN: Essential hypertension - Plan: hydrochlorothiazide (HYDRODIURIL) 12.5 MG tablet  Prediabetes - Plan: metFORMIN (GLUCOPHAGE) 500 MG tablet  Vitamin D deficiency - Plan: Vitamin D, Ergocalciferol, (DRISDOL) 50000 units CAPS capsule  Insulin resistance - Plan: metFORMIN (GLUCOPHAGE) 500 MG tablet  At risk for heart disease  Morbid obesity ([place])  PLAN:  Hypertension We discussed sodium restriction, working on healthy weight loss, and a regular exercise program as the means to achieve  improved blood pressure control. She has agreed to start to take HCTZ 12.5 mg every morning #30 with no refills and will watch for signs of hypotension as she continues her lifestyle modifications. Leslie Wright agreed with this plan and agreed to follow up as directed. We will continue to monitor her blood pressure as well as her progress with the above lifestyle modifications.   Stage I Diastolic Dysfunction Leslie Wright will continue to work on diet, weight loss and exercise. She agrees to work on tight control of blood pressure, we discussed diagnosis in depth today. Leslie Wright agreed to follow up with [place] as planned.  Cardiovascular risk counselling Leslie Wright was given extended (at least 15 minutes) coronary artery disease prevention counseling today. She is 51 y.o. female and has risk factors for heart disease including obesity. We discussed intensive lifestyle modifications today with an emphasis on specific weight loss instructions and strategies. Pt was also informed of the importance of increasing exercise and decreasing saturated fats to help prevent heart disease.  Vitamin D Deficiency Leslie Wright was informed that low vitamin D levels contributes to fatigue and are associated with obesity, breast, and colon cancer. She agrees to start to take prescription Vit D @50 ,000 IU every week #4 with no refills and will follow up  for routine testing of vitamin D, at least 2-3 times per year. She was informed of the risk of over-replacement of vitamin D and agrees to not increase her dose unless he discusses this with [place] first.  Insulin Resistance Leslie Wright will continue to work on weight loss, exercise, and decreasing simple carbohydrates in her diet to help decrease the risk of diabetes. She agrees to start metformin 500 mg every morning #30 with no refills. She was informed that eating too many simple carbohydrates or too many calories at one sitting increases the likelihood of GI side effects. Leslie Wright agreed to follow up with [place] as directed to monitor her progress.  Obesity Leslie Wright is currently in the action stage of change. As such, her goal is to continue with weight loss efforts She has agreed to keep a food journal with 1100-1400 calories and 75 grams of protein daily or follow the Category 2 plan. Lamekia has been instructed to work up to a goal of 150 minutes of combined cardio and strengthening exercise per week for weight loss and overall health benefits. We discussed the following Behavioral Modification Stratagies today: increasing lean protein intake, decreasing simple carbohydrates , increasing lower sugar fruits and decreasing sodium intake  Jasmine has agreed to follow up with our clinic in 2 weeks. She was informed of the importance of frequent follow up visits to maximize her success with intensive lifestyle modifications for her multiple health conditions.  I, Doreene Nest, am acting as scribe for Dennard Nip, MD  I have reviewed the above documentation for accuracy and completeness, and I agree with the above. -Dennard Nip, MD

## 2016-11-13 ENCOUNTER — Ambulatory Visit (INDEPENDENT_AMBULATORY_CARE_PROVIDER_SITE_OTHER): Payer: 59 | Admitting: Family Medicine

## 2016-11-13 VITALS — BP 138/82 | HR 70 | Temp 98.0°F | Ht 66.0 in | Wt 272.0 lb

## 2016-11-13 DIAGNOSIS — Z9189 Other specified personal risk factors, not elsewhere classified: Secondary | ICD-10-CM | POA: Diagnosis not present

## 2016-11-13 DIAGNOSIS — E559 Vitamin D deficiency, unspecified: Secondary | ICD-10-CM

## 2016-11-13 DIAGNOSIS — E782 Mixed hyperlipidemia: Secondary | ICD-10-CM | POA: Diagnosis not present

## 2016-11-13 MED ORDER — ASPIRIN 81 MG PO TABS: 81.0000 mg | ORAL_TABLET | Freq: Every day | ORAL | 0 refills | Status: DC

## 2016-11-13 MED ORDER — VITAMIN D (ERGOCALCIFEROL) 1.25 MG (50000 UNIT) PO CAPS: 50000.0000 [IU] | ORAL_CAPSULE | ORAL | 0 refills | Status: DC

## 2016-11-14 NOTE — Progress Notes (Signed)
Office: [phone]  /  Fax: [phone]   HPI:   Chief Complaint: OBESITY Leslie Wright is here to discuss her progress with her obesity treatment plan. She is following her eating plan approximately 100 % of the time and states she is exercising 0 minutes 0 times per week. Leslie Wright has done well with weight loss efforts. She started to journal on paper but didn't track her calories or protein. Her simple carbohydrates looked too high and not enough lean protein or vegetables.  Her weight is 272 lb (123.4 kg) today and has had a weight loss of 6 pounds over a period of 2 to 3 weeks since her last visit. She has lost 15 lbs since starting treatment with [place].  Vitamin D deficiency Leslie Wright has a diagnosis of vitamin D deficiency. She is not yet at goal. She is currently taking vit D and denies nausea, vomiting or muscle weakness.  Hyperlipidemia Leslie Wright has hyperlipidemia and has been trying to improve her cholesterol levels with intensive lifestyle modification including a low saturated fat diet, exercise and weight loss. She has a strong family history of coronary artery disease and hyperlipidemia. She denies any chest pain, claudication or myalgias. Patient would like to know if she would benefit from daily aspirin.  At risk for cardiovascular disease Leslie Wright is at a higher than average risk for cardiovascular disease due to obesity. She currently denies any chest pain.  Wt Readings from Last 500 Encounters:  11/13/16 272 lb (123.4 kg)  10/25/16 278 lb (126.1 kg)  10/10/16 283 lb (128.4 kg)  09/27/16 287 lb 9.6 oz (130.5 kg)     ALLERGIES: Not on File  MEDICATIONS: Current Outpatient Prescriptions on File Prior to Visit  Medication Sig Dispense Refill  . hydrochlorothiazide (HYDRODIURIL) 12.5 MG tablet Take 1 tablet (12.5 mg total) by mouth daily. 30 tablet 0  . ibuprofen (ADVIL,MOTRIN) 200 MG tablet Take 200 mg by mouth 4 (four) times daily.    . metFORMIN (GLUCOPHAGE) 500 MG tablet  Take 1 tablet (500 mg total) by mouth daily with breakfast. 30 tablet 0  . Multiple Vitamin (MULTIVITAMIN) tablet Take 1 tablet by mouth daily.     No current facility-administered medications on file prior to visit.     PAST MEDICAL HISTORY: Past Medical History:  Diagnosis Date  . Acid reflux   . Anemia   . Back pain   . Gallbladder problem   . High blood pressure   . Hip pain   . Pre-diabetes   . Shortness of breath   . Sleep apnea   . Swelling     PAST SURGICAL HISTORY: Past Surgical History:  Procedure Laterality Date  . CARPAL TUNNEL RELEASE    . GALLBLADDER SURGERY     April 2012  . LASIK     2000    SOCIAL HISTORY: Social History  Substance Use Topics  . Smoking status: Never Smoker  . Smokeless tobacco: Never Used  . Alcohol use Not on file    FAMILY HISTORY: Family History  Problem Relation Age of Onset  . Diabetes Mother   . Thyroid disease Mother   . Liver disease Mother   . Obesity Mother   . Hypertension Father   . Hyperlipidemia Father   . Heart disease Father   . Stroke Father   . Obesity Father     ROS: Review of Systems  Constitutional: Positive for malaise/fatigue.  Cardiovascular: Negative for chest pain and claudication.  Gastrointestinal: Negative for nausea and vomiting.  Musculoskeletal: Negative for myalgias.       Negative muscle weakness    PHYSICAL EXAM: Blood pressure 138/82, pulse 70, temperature 98 F (36.7 C), temperature source Oral, height 5\' 6"  (1.676 m), weight 272 lb (123.4 kg), last menstrual period 10/20/2016, SpO2 98 %. Body mass index is 43.9 kg/m. Physical Exam  Constitutional: She is oriented to person, place, and time. She appears well-developed and well-nourished.  Cardiovascular: Normal rate.   Pulmonary/Chest: Effort normal.  Musculoskeletal: Normal range of motion.  Neurological: She is oriented to person, place, and time.  Skin: Skin is warm and dry.  Psychiatric: She has a normal mood and  affect. Her behavior is normal.  Vitals reviewed.   RECENT LABS AND TESTS: BMET    Component Value Date/Time   NA 144 09/27/2016 1342   K 4.6 09/27/2016 1342   CL 103 09/27/2016 1342   CO2 25 09/27/2016 1342   GLUCOSE 82 09/27/2016 1342   GLUCOSE 94 12/28/2010 1530   BUN 14 09/27/2016 1342   CREATININE 1.12 (H) 09/27/2016 1342   CALCIUM 9.1 09/27/2016 1342   GFRNONAA 57 (L) 09/27/2016 1342   GFRAA 66 09/27/2016 1342   Lab Results  Component Value Date   HGBA1C 5.5 09/27/2016   Lab Results  Component Value Date   INSULIN 20.0 09/27/2016   CBC    Component Value Date/Time   WBC 8.3 09/27/2016 1342   WBC 9.1 12/28/2010 1530   RBC 4.93 09/27/2016 1342   RBC 4.66 12/28/2010 1530   HGB 11.6 (L) 12/28/2010 1530   HCT 40.9 09/27/2016 1342   PLT 193 12/28/2010 1530   MCV 83 09/27/2016 1342   MCH 27.2 09/27/2016 1342   MCH 24.9 (L) 12/28/2010 1530   MCHC 32.8 09/27/2016 1342   MCHC 31.8 12/28/2010 1530   RDW 15.9 (H) 09/27/2016 1342   LYMPHSABS 2.3 09/27/2016 1342   MONOABS 1.1 (H) 11/10/2010 0554   EOSABS 0.6 (H) 09/27/2016 1342   BASOSABS 0.0 09/27/2016 1342   Iron/TIBC/Ferritin/ %Sat No results found for: IRON, TIBC, FERRITIN, IRONPCTSAT Lipid Panel     Component Value Date/Time   CHOL 226 (H) 09/27/2016 1342   TRIG 184 (H) 09/27/2016 1342   HDL 49 09/27/2016 1342   LDLCALC 140 (H) 09/27/2016 1342   Hepatic Function Panel     Component Value Date/Time   PROT 7.0 09/27/2016 1342   ALBUMIN 4.1 09/27/2016 1342   AST 16 09/27/2016 1342   ALT 15 09/27/2016 1342   ALKPHOS 76 09/27/2016 1342   BILITOT <0.2 09/27/2016 1342   BILIDIR 0.1 11/09/2010 1645   IBILI 0.4 11/09/2010 1645      Component Value Date/Time   TSH 1.890 09/27/2016 1342    ASSESSMENT AND PLAN: Mixed hyperlipidemia - Plan: aspirin 81 MG tablet  Vitamin D deficiency - Plan: Vitamin D, Ergocalciferol, (DRISDOL) 50000 units CAPS capsule  At risk for heart disease  Morbid obesity  ([place])  PLAN:   Vitamin D Deficiency Leslie Wright was informed that low vitamin D levels contributes to fatigue and are associated with obesity, breast, and colon cancer. She agrees to continue to take prescription Vit D @50 ,000 IU every week and will follow up for routine testing of vitamin D, at least 2-3 times per year. She was informed of the risk of over-replacement of vitamin D and agrees to not increase her dose unless he discusses this with [place] first.  Hyperlipidemia Leslie Wright was informed of the American Heart Association Guidelines emphasizing intensive lifestyle modifications  as the first line treatment for hyperlipidemia. We discussed many lifestyle modifications today in depth, and Success will continue to work on decreasing saturated fats such as fatty red meat, butter and many fried foods. She will also increase vegetables and lean protein in her diet and continue to work on exercise and weight loss efforts. We discussed risks versus benefits of ASA for primary prevention of heart disease and she would like to start this medication. Leslie Wright agrees to start to take ASA 81 mg every morning with food, #30 with no refills.  Cardiovascular risk counselling Leslie Wright was given extended (at least 15 minutes) coronary artery disease prevention counseling today. She is 51 y.o. female and has risk factors for heart disease including obesity. We discussed intensive lifestyle modifications today with an emphasis on specific weight loss instructions and strategies. Pt was also informed of the importance of increasing exercise and decreasing saturated fats to help prevent heart disease.  Obesity Leslie Wright is currently in the action stage of change. As such, her goal is to continue with weight loss efforts She has agreed to keep a food journal with 1100 to 1500 calories and 75+ grams of protein daily Leslie Wright has been instructed to work up to a goal of 150 minutes of combined cardio and strengthening exercise per week  for weight loss and overall health benefits. We discussed the following Behavioral Modification Stratagies today: increasing lean protein intake, decreasing simple carbohydrates , increasing vegetables and increasing fiber rich foods  Leslie Wright has agreed to follow up with our clinic in 2 weeks. She was informed of the importance of frequent follow up visits to maximize her success with intensive lifestyle modifications for her multiple health conditions.  I, Doreene Nest, am acting as scribe for Dennard Nip, MD  I have reviewed the above documentation for accuracy and completeness, and I agree with the above. -Dennard Nip, MD

## 2016-11-20 ENCOUNTER — Other Ambulatory Visit (INDEPENDENT_AMBULATORY_CARE_PROVIDER_SITE_OTHER): Payer: Self-pay | Admitting: Family Medicine

## 2016-11-20 DIAGNOSIS — E559 Vitamin D deficiency, unspecified: Secondary | ICD-10-CM

## 2016-11-21 ENCOUNTER — Other Ambulatory Visit (INDEPENDENT_AMBULATORY_CARE_PROVIDER_SITE_OTHER): Payer: Self-pay | Admitting: Family Medicine

## 2016-11-21 DIAGNOSIS — E559 Vitamin D deficiency, unspecified: Secondary | ICD-10-CM

## 2016-11-21 DIAGNOSIS — E782 Mixed hyperlipidemia: Secondary | ICD-10-CM

## 2016-11-21 NOTE — Telephone Encounter (Signed)
Can you check into thi please?

## 2016-11-21 NOTE — Telephone Encounter (Signed)
Can you check into this please

## 2016-11-26 ENCOUNTER — Encounter (INDEPENDENT_AMBULATORY_CARE_PROVIDER_SITE_OTHER): Payer: Self-pay

## 2016-11-27 ENCOUNTER — Ambulatory Visit (INDEPENDENT_AMBULATORY_CARE_PROVIDER_SITE_OTHER): Payer: 59 | Admitting: Family Medicine

## 2016-11-27 ENCOUNTER — Encounter (INDEPENDENT_AMBULATORY_CARE_PROVIDER_SITE_OTHER): Payer: Self-pay | Admitting: Family Medicine

## 2016-11-27 VITALS — BP 130/78 | HR 69 | Temp 98.6°F | Ht 66.0 in | Wt 274.0 lb

## 2016-11-27 DIAGNOSIS — E88819 Insulin resistance, unspecified: Secondary | ICD-10-CM | POA: Insufficient documentation

## 2016-11-27 DIAGNOSIS — E559 Vitamin D deficiency, unspecified: Secondary | ICD-10-CM | POA: Diagnosis not present

## 2016-11-27 DIAGNOSIS — I10 Essential (primary) hypertension: Secondary | ICD-10-CM

## 2016-11-27 DIAGNOSIS — E8881 Metabolic syndrome: Secondary | ICD-10-CM | POA: Insufficient documentation

## 2016-11-27 DIAGNOSIS — Z9189 Other specified personal risk factors, not elsewhere classified: Secondary | ICD-10-CM

## 2016-11-27 MED ORDER — HYDROCHLOROTHIAZIDE 12.5 MG PO TABS: 12.5000 mg | ORAL_TABLET | Freq: Every day | ORAL | 0 refills | Status: DC

## 2016-11-27 MED ORDER — ASPIRIN 81 MG PO TABS: 81.0000 mg | ORAL_TABLET | Freq: Every day | ORAL | 0 refills | Status: DC

## 2016-11-27 MED ORDER — VITAMIN D (ERGOCALCIFEROL) 1.25 MG (50000 UNIT) PO CAPS: 50000.0000 [IU] | ORAL_CAPSULE | ORAL | 0 refills | Status: DC

## 2016-11-27 MED ORDER — METFORMIN HCL 500 MG PO TABS: 500.0000 mg | ORAL_TABLET | Freq: Every day | ORAL | 0 refills | Status: DC

## 2016-11-27 MED FILL — metFORMIN HCL 500 MG TABS: 500 | 30 days supply | Qty: 30 | Fill #0

## 2016-11-27 MED FILL — VIT D2 1.25 MG (50,000 UNIT: 1.25 MG | 28 days supply | Qty: 4 | Fill #0

## 2016-11-27 MED FILL — HYDROCHLOROTHIAZIDE 12.5 MG: 12.5 | 30 days supply | Qty: 30 | Fill #0

## 2016-11-27 MED FILL — ASPIR-LOW EC 81 MG TABLET: 81 | 30 days supply | Qty: 30 | Fill #0

## 2016-11-27 NOTE — Progress Notes (Signed)
Office: [phone]  /  Fax: [phone]   HPI:   Chief Complaint: OBESITY Joslynne is here to discuss her progress with her obesity treatment plan. She is following her eating plan approximately 90 % of the time and states she is exercising 0 minutes 0 times per week. Odessia is journaling on and off. She is struggling to eat all her protein and sometimes making bad choices with increased celebration eating and eating out. She is ready to get back on track.  Her weight is 274 lb (124.3 kg) today and has had a weight gain of 2 lbs over a period of 2 weeks since her last visit. She has lost 13 lbs since starting treatment with [place].  Insulin Resistance Latasia has a diagnosis of insulin resistance based on her elevated fasting insulin level >5. Although Baeleigh's blood glucose readings are still under good control, insulin resistance puts her at greater risk of metabolic syndrome and diabetes. She started taking metformin and denies nausea, vomiting or hypoglycemia. Sinahi continues to work on diet and exercise to decrease risk of diabetes.  Hypertension JAYSON MCNEASE is a 51 y.o. female with hypertension. She is currently on HCTZ and blood pressure is improved. NAKITTA WAFFLE denies headache, chest pain or shortness of breath on exertion. She is also on ASA for first degree coronary artery disease prevention with hypertension. She is working weight loss to help control her blood pressure with the goal of decreasing her risk of heart attack and stroke. [name] blood pressure is not currently controlled.  At risk for cardiovascular disease Danajia is at a higher than average risk for cardiovascular disease due to obesity. She currently denies any chest pain.  Vitamin D deficiency Presley has a diagnosis of vitamin D deficiency. She is currently taking vit D and is not currently at goal. Fatigue is still present and she denies nausea, vomiting or muscle weakness.  Wt Readings from Last 500  Encounters:  11/27/16 274 lb (124.3 kg)  11/13/16 272 lb (123.4 kg)  10/25/16 278 lb (126.1 kg)  10/10/16 283 lb (128.4 kg)  09/27/16 287 lb 9.6 oz (130.5 kg)     ALLERGIES: Not on File  MEDICATIONS: Current Outpatient Prescriptions on File Prior to Visit  Medication Sig Dispense Refill  . aspirin 81 MG tablet Take 1 tablet (81 mg total) by mouth daily. 30 tablet 0  . hydrochlorothiazide (HYDRODIURIL) 12.5 MG tablet Take 1 tablet (12.5 mg total) by mouth daily. 30 tablet 0  . ibuprofen (ADVIL,MOTRIN) 200 MG tablet Take 200 mg by mouth 4 (four) times daily.    . metFORMIN (GLUCOPHAGE) 500 MG tablet Take 1 tablet (500 mg total) by mouth daily with breakfast. 30 tablet 0  . Multiple Vitamin (MULTIVITAMIN) tablet Take 1 tablet by mouth daily.    . Vitamin D, Ergocalciferol, (DRISDOL) 50000 units CAPS capsule Take 1 capsule (50,000 Units total) by mouth every 7 (seven) days. 4 capsule 0   No current facility-administered medications on file prior to visit.     PAST MEDICAL HISTORY: Past Medical History:  Diagnosis Date  . Acid reflux   . Anemia   . Back pain   . Gallbladder problem   . High blood pressure   . Hip pain   . Pre-diabetes   . Shortness of breath   . Sleep apnea   . Swelling     PAST SURGICAL HISTORY: Past Surgical History:  Procedure Laterality Date  . CARPAL TUNNEL RELEASE    .  GALLBLADDER SURGERY     April 2012  . LASIK     2000    SOCIAL HISTORY: Social History  Substance Use Topics  . Smoking status: Never Smoker  . Smokeless tobacco: Never Used  . Alcohol use Not on file    FAMILY HISTORY: Family History  Problem Relation Age of Onset  . Diabetes Mother   . Thyroid disease Mother   . Liver disease Mother   . Obesity Mother   . Hypertension Father   . Hyperlipidemia Father   . Heart disease Father   . Stroke Father   . Obesity Father     ROS: Review of Systems  Constitutional: Positive for malaise/fatigue. Negative for weight loss.   Cardiovascular: Negative for chest pain.  Gastrointestinal: Negative for nausea and vomiting.  Musculoskeletal:       Negative muscle weakness  Endo/Heme/Allergies:       Negative hypoglycemia    PHYSICAL EXAM: Blood pressure 130/78, pulse 69, temperature 98.6 F (37 C), temperature source Oral, height 5\' 6"  (1.676 m), weight 274 lb (124.3 kg), last menstrual period 11/25/2016, SpO2 97 %. Body mass index is 44.22 kg/m. Physical Exam  Constitutional: She is oriented to person, place, and time. She appears well-developed and well-nourished.  Cardiovascular: Normal rate.   Pulmonary/Chest: Effort normal.  Musculoskeletal: Normal range of motion.  Neurological: She is oriented to person, place, and time.  Skin: Skin is warm and dry.  Psychiatric: She has a normal mood and affect. Her behavior is normal.  Vitals reviewed.   RECENT LABS AND TESTS: BMET    Component Value Date/Time   NA 144 09/27/2016 1342   K 4.6 09/27/2016 1342   CL 103 09/27/2016 1342   CO2 25 09/27/2016 1342   GLUCOSE 82 09/27/2016 1342   GLUCOSE 94 12/28/2010 1530   BUN 14 09/27/2016 1342   CREATININE 1.12 (H) 09/27/2016 1342   CALCIUM 9.1 09/27/2016 1342   GFRNONAA 57 (L) 09/27/2016 1342   GFRAA 66 09/27/2016 1342   Lab Results  Component Value Date   HGBA1C 5.5 09/27/2016   Lab Results  Component Value Date   INSULIN 20.0 09/27/2016   CBC    Component Value Date/Time   WBC 8.3 09/27/2016 1342   WBC 9.1 12/28/2010 1530   RBC 4.93 09/27/2016 1342   RBC 4.66 12/28/2010 1530   HGB 11.6 (L) 12/28/2010 1530   HCT 40.9 09/27/2016 1342   PLT 193 12/28/2010 1530   MCV 83 09/27/2016 1342   MCH 27.2 09/27/2016 1342   MCH 24.9 (L) 12/28/2010 1530   MCHC 32.8 09/27/2016 1342   MCHC 31.8 12/28/2010 1530   RDW 15.9 (H) 09/27/2016 1342   LYMPHSABS 2.3 09/27/2016 1342   MONOABS 1.1 (H) 11/10/2010 0554   EOSABS 0.6 (H) 09/27/2016 1342   BASOSABS 0.0 09/27/2016 1342   Iron/TIBC/Ferritin/ %Sat No  results found for: IRON, TIBC, FERRITIN, IRONPCTSAT Lipid Panel     Component Value Date/Time   CHOL 226 (H) 09/27/2016 1342   TRIG 184 (H) 09/27/2016 1342   HDL 49 09/27/2016 1342   LDLCALC 140 (H) 09/27/2016 1342   Hepatic Function Panel     Component Value Date/Time   PROT 7.0 09/27/2016 1342   ALBUMIN 4.1 09/27/2016 1342   AST 16 09/27/2016 1342   ALT 15 09/27/2016 1342   ALKPHOS 76 09/27/2016 1342   BILITOT <0.2 09/27/2016 1342   BILIDIR 0.1 11/09/2010 1645   IBILI 0.4 11/09/2010 1645  Component Value Date/Time   TSH 1.890 09/27/2016 1342    ASSESSMENT AND PLAN: Vitamin D deficiency - Plan: Vitamin D, Ergocalciferol, (DRISDOL) 50000 units CAPS capsule  Insulin resistance - Plan: metFORMIN (GLUCOPHAGE) 500 MG tablet  Essential hypertension - Plan: hydrochlorothiazide (HYDRODIURIL) 12.5 MG tablet, aspirin 81 MG tablet  At risk for heart disease  Morbid obesity ([place])  PLAN:  Insulin Resistance Shakierra will continue to work on weight loss, exercise, and decreasing simple carbohydrates in her diet to help decrease the risk of diabetes. We dicussed metformin including benefits and risks. She was informed that eating too many simple carbohydrates or too many calories at one sitting increases the likelihood of GI side effects. Terilynn agrees to continue Metformin and prescription refill was written today for 1 month and we will re-check labs in 1 month. Laurelyn agreed to follow up with [place] as directed to monitor her progress.  Hypertension We discussed sodium restriction, working on healthy weight loss, and a regular exercise program as the means to achieve improved blood pressure control. Maliya agreed with this plan and agreed to follow up as directed. We will continue to monitor her blood pressure as well as her progress with the above lifestyle modifications. She will continue her HCTZ as prescribed and we will refill for 1 month and ASA 81 mg every day #30 with no refills  and will watch for signs of hypotension as she continues her lifestyle modifications.  Cardiovascular risk counselling Fikisha was given extended (at least 15 minutes) coronary artery disease prevention counseling today. She is 51 y.o. female and has risk factors for heart disease including obesity. We discussed intensive lifestyle modifications today with an emphasis on specific weight loss instructions and strategies. Pt was also informed of the importance of increasing exercise and decreasing saturated fats to help prevent heart disease.  Vitamin D Deficiency Ildiko was informed that low vitamin D levels contributes to fatigue and are associated with obesity, breast, and colon cancer. She agrees to continue to take prescription Vit D @50 ,000 IU every week, will refill for 1 month and we will re-check labs in 1 month and will follow up for routine testing of vitamin D, at least 2-3 times per year. She was informed of the risk of over-replacement of vitamin D and agrees to not increase her dose unless he discusses this with [place] first.  Obesity Neviyah is currently in the action stage of change. As such, her goal is to continue with weight loss efforts She has agreed to keep a food journal with 1200 to 1500 calories and 75+ grams of protein daily and follow the Category 2 plan Jalayla has been instructed to work up to a goal of 150 minutes of combined cardio and strengthening exercise per week for weight loss and overall health benefits. We discussed the following Behavioral Modification Stratagies today: increasing lean protein intake, decreasing simple carbohydrates , increasing vegetables, increasing lower sugar fruits, increasing fiber rich foods and decrease eating out  Danis has agreed to follow up with our clinic in 2 weeks. She was informed of the importance of frequent follow up visits to maximize her success with intensive lifestyle modifications for her multiple health conditions.  I, Doreene Nest, am acting as scribe for Dennard Nip, MD  I have reviewed the above documentation for accuracy and completeness, and I agree with the above. -Dennard Nip, MD

## 2016-12-11 ENCOUNTER — Ambulatory Visit (INDEPENDENT_AMBULATORY_CARE_PROVIDER_SITE_OTHER): Payer: 59 | Admitting: Family Medicine

## 2016-12-11 VITALS — BP 125/85 | HR 77 | Temp 98.0°F | Ht 66.0 in | Wt 266.0 lb

## 2016-12-11 DIAGNOSIS — R7303 Prediabetes: Secondary | ICD-10-CM | POA: Diagnosis not present

## 2016-12-12 NOTE — Progress Notes (Signed)
Office: [phone]  /  Fax: [phone]   HPI:   Chief Complaint: OBESITY Leslie Wright is here to discuss her progress with her obesity treatment plan. She is following her eating plan approximately 100 % of the time and states she is exercising 0 minutes 0 times per week. Leslie Wright continues to do well with weight loss but is not always eating enough calories or protein. Her weight is 266 lb (120.7 kg) today and has had a weight loss of 8 pounds over a period of 2 weeks since her last visit. She has lost 21 lbs since starting treatment with [place].  Pre-Diabetes Leslie Wright has a diagnosis of prediabetes based on her elevated HgA1c and was informed this puts her at greater risk of developing diabetes. She is currently taking and is stable on Metformin. She has decreased polyphagia on Metformin. She denies nausea or hypoglycemia. Leslie Wright continues to work on diet and exercise to decrease risk of diabetes.   Wt Readings from Last 500 Encounters:  12/11/16 266 lb (120.7 kg)  11/27/16 274 lb (124.3 kg)  11/13/16 272 lb (123.4 kg)  10/25/16 278 lb (126.1 kg)  10/10/16 283 lb (128.4 kg)  09/27/16 287 lb 9.6 oz (130.5 kg)     ALLERGIES: Not on File  MEDICATIONS: Current Outpatient Prescriptions on File Prior to Visit  Medication Sig Dispense Refill  . aspirin 81 MG tablet Take 1 tablet (81 mg total) by mouth daily. 30 tablet 0  . hydrochlorothiazide (HYDRODIURIL) 12.5 MG tablet Take 1 tablet (12.5 mg total) by mouth daily. 30 tablet 0  . ibuprofen (ADVIL,MOTRIN) 200 MG tablet Take 200 mg by mouth 4 (four) times daily.    . metFORMIN (GLUCOPHAGE) 500 MG tablet Take 1 tablet (500 mg total) by mouth daily with breakfast. 30 tablet 0  . Multiple Vitamin (MULTIVITAMIN) tablet Take 1 tablet by mouth daily.    . Vitamin D, Ergocalciferol, (DRISDOL) 50000 units CAPS capsule Take 1 capsule (50,000 Units total) by mouth every 7 (seven) days. 4 capsule 0   No current facility-administered medications on file  prior to visit.     PAST MEDICAL HISTORY: Past Medical History:  Diagnosis Date  . Acid reflux   . Anemia   . Back pain   . Gallbladder problem   . High blood pressure   . Hip pain   . Pre-diabetes   . Shortness of breath   . Sleep apnea   . Swelling     PAST SURGICAL HISTORY: Past Surgical History:  Procedure Laterality Date  . CARPAL TUNNEL RELEASE    . GALLBLADDER SURGERY     April 2012  . LASIK     2000    SOCIAL HISTORY: Social History  Substance Use Topics  . Smoking status: Never Smoker  . Smokeless tobacco: Never Used  . Alcohol use Not on file    FAMILY HISTORY: Family History  Problem Relation Age of Onset  . Diabetes Mother   . Thyroid disease Mother   . Liver disease Mother   . Obesity Mother   . Hypertension Father   . Hyperlipidemia Father   . Heart disease Father   . Stroke Father   . Obesity Father     ROS: Review of Systems  Constitutional: Positive for weight loss.  Gastrointestinal: Negative for nausea.  Endo/Heme/Allergies:       Polyphagia Negative hypoglycemia    PHYSICAL EXAM: Blood pressure 125/85, pulse 77, temperature 98 F (36.7 C), temperature source Oral, height 5\' 6"  (  1.676 m), weight 266 lb (120.7 kg), last menstrual period 11/25/2016, SpO2 97 %. Body mass index is 42.93 kg/m. Physical Exam  Constitutional: She is oriented to person, place, and time. She appears well-developed and well-nourished.  Cardiovascular: Normal rate.   Pulmonary/Chest: Effort normal.  Musculoskeletal: Normal range of motion.  Neurological: She is oriented to person, place, and time.  Skin: Skin is warm and dry.  Psychiatric: She has a normal mood and affect. Her behavior is normal.  Vitals reviewed.   RECENT LABS AND TESTS: BMET    Component Value Date/Time   NA 144 09/27/2016 1342   K 4.6 09/27/2016 1342   CL 103 09/27/2016 1342   CO2 25 09/27/2016 1342   GLUCOSE 82 09/27/2016 1342   GLUCOSE 94 12/28/2010 1530   BUN 14  09/27/2016 1342   CREATININE 1.12 (H) 09/27/2016 1342   CALCIUM 9.1 09/27/2016 1342   GFRNONAA 57 (L) 09/27/2016 1342   GFRAA 66 09/27/2016 1342   Lab Results  Component Value Date   HGBA1C 5.5 09/27/2016   Lab Results  Component Value Date   INSULIN 20.0 09/27/2016   CBC    Component Value Date/Time   WBC 8.3 09/27/2016 1342   WBC 9.1 12/28/2010 1530   RBC 4.93 09/27/2016 1342   RBC 4.66 12/28/2010 1530   HGB 11.6 (L) 12/28/2010 1530   HCT 40.9 09/27/2016 1342   PLT 193 12/28/2010 1530   MCV 83 09/27/2016 1342   MCH 27.2 09/27/2016 1342   MCH 24.9 (L) 12/28/2010 1530   MCHC 32.8 09/27/2016 1342   MCHC 31.8 12/28/2010 1530   RDW 15.9 (H) 09/27/2016 1342   LYMPHSABS 2.3 09/27/2016 1342   MONOABS 1.1 (H) 11/10/2010 0554   EOSABS 0.6 (H) 09/27/2016 1342   BASOSABS 0.0 09/27/2016 1342   Iron/TIBC/Ferritin/ %Sat No results found for: IRON, TIBC, FERRITIN, IRONPCTSAT Lipid Panel     Component Value Date/Time   CHOL 226 (H) 09/27/2016 1342   TRIG 184 (H) 09/27/2016 1342   HDL 49 09/27/2016 1342   LDLCALC 140 (H) 09/27/2016 1342   Hepatic Function Panel     Component Value Date/Time   PROT 7.0 09/27/2016 1342   ALBUMIN 4.1 09/27/2016 1342   AST 16 09/27/2016 1342   ALT 15 09/27/2016 1342   ALKPHOS 76 09/27/2016 1342   BILITOT <0.2 09/27/2016 1342   BILIDIR 0.1 11/09/2010 1645   IBILI 0.4 11/09/2010 1645      Component Value Date/Time   TSH 1.890 09/27/2016 1342    ASSESSMENT AND PLAN: Prediabetes  Morbid obesity ([place])  PLAN:  Pre-Diabetes Leslie Wright will continue to work on weight loss, exercise, and decreasing simple carbohydrates in her diet to help decrease the risk of diabetes. We dicussed metformin including benefits and risks. She was informed that eating too many simple carbohydrates or too many calories at one sitting increases the likelihood of GI side effects. Leslie Wright agrees to continue Metformin and we will re-check labs in 2 weeks. Leslie Wright agreed to  follow up with [place] as directed to monitor her progress.  We spent > than 50% of the 15 minute visit on the counseling as documented in the note.  Obesity Leslie Wright is currently in the action stage of change. As such, her goal is to continue with weight loss efforts She has agreed to keep a food journal with 1200 to 1500 calories and 75+ grams of protein daily Leslie Wright has been instructed to work up to a goal of 150 minutes of  combined cardio and strengthening exercise per week for weight loss and overall health benefits. We discussed the following Behavioral Modification Stratagies today: not skipping meals, increasing lean protein intake  Leslie Wright has agreed to follow up with our clinic in 2 weeks. She was informed of the importance of frequent follow up visits to maximize her success with intensive lifestyle modifications for her multiple health conditions.  I, Doreene Nest, am acting as scribe for Dennard Nip, MD  I have reviewed the above documentation for accuracy and completeness, and I agree with the above. -Dennard Nip, MD

## 2016-12-25 ENCOUNTER — Ambulatory Visit (INDEPENDENT_AMBULATORY_CARE_PROVIDER_SITE_OTHER): Payer: 59 | Admitting: Family Medicine

## 2016-12-25 VITALS — BP 134/74 | HR 61 | Temp 98.1°F | Ht 66.0 in | Wt 265.0 lb

## 2016-12-25 DIAGNOSIS — E784 Other hyperlipidemia: Secondary | ICD-10-CM

## 2016-12-25 DIAGNOSIS — I10 Essential (primary) hypertension: Secondary | ICD-10-CM

## 2016-12-25 DIAGNOSIS — E559 Vitamin D deficiency, unspecified: Secondary | ICD-10-CM

## 2016-12-25 DIAGNOSIS — Z9189 Other specified personal risk factors, not elsewhere classified: Secondary | ICD-10-CM

## 2016-12-25 DIAGNOSIS — E7849 Other hyperlipidemia: Secondary | ICD-10-CM

## 2016-12-25 DIAGNOSIS — E8881 Metabolic syndrome: Secondary | ICD-10-CM | POA: Diagnosis not present

## 2016-12-25 DIAGNOSIS — R7303 Prediabetes: Secondary | ICD-10-CM | POA: Diagnosis not present

## 2016-12-25 MED ORDER — HYDROCHLOROTHIAZIDE 12.5 MG PO TABS: 12.5000 mg | ORAL_TABLET | Freq: Every day | ORAL | 0 refills | Status: DC

## 2016-12-25 MED ORDER — METFORMIN HCL 500 MG PO TABS: 500.0000 mg | ORAL_TABLET | Freq: Every day | ORAL | 0 refills | Status: DC

## 2016-12-25 MED ORDER — VITAMIN D (ERGOCALCIFEROL) 1.25 MG (50000 UNIT) PO CAPS: 50000.0000 [IU] | ORAL_CAPSULE | ORAL | 0 refills | Status: DC

## 2016-12-25 MED FILL — HYDROCHLOROTHIAZIDE 12.5 MG: 12.5 | 30 days supply | Qty: 30 | Fill #0

## 2016-12-25 MED FILL — VIT D2 1.25 MG (50,000 UNIT: 1.25 MG | 28 days supply | Qty: 4 | Fill #0

## 2016-12-25 MED FILL — metFORMIN HCL 500 MG TABS: 500 | 30 days supply | Qty: 30 | Fill #0

## 2016-12-25 NOTE — Progress Notes (Signed)
Office: [phone]  /  Fax: [phone]   HPI:   Chief Complaint: OBESITY Leslie Wright is here to discuss her progress with her obesity treatment plan. She is following her eating plan approximately 100 % of the time and states she is exercising 0 minutes 0 times per week. Leslie Wright continues to do well with weight loss, is journaling regularly and doing well with increased protein and vegetables. She is not exercising yet but she states she is ready. Her weight is 265 lb (120.2 kg) today and has had a weight loss of 1 pound over a period of 2 weeks since her last visit. She has lost 22 lbs since starting treatment with [place].  Vitamin D deficiency Leslie Wright has a diagnosis of vitamin D deficiency. She is currently stable on vit D and denies nausea, vomiting or muscle weakness. She is due for labs.  Pre-Diabetes Leslie Wright has a diagnosis of prediabetes based on her elevated Hgb A1c and was informed this puts her at greater risk of developing diabetes. She is stable on metformin currently and continues to work on diet and exercise to decrease risk of diabetes. She denies nausea or hypoglycemia.  At risk for diabetes Leslie Wright is at higher than average risk for developing diabetes due to her obesity. She currently denies polyuria or polydipsia.  Hyperlipidemia Leslie Wright has hyperlipidemia and has been attempting to control her cholesterol levels with intensive lifestyle modification including a low saturated fat diet, exercise and weight loss. She denies any chest pain, claudication or myalgias.  Hypertension Leslie Wright is a 51 y.o. female with hypertension.  Leslie Wright denies headache, chest pain or shortness of breath on exertion. She is working weight loss to help control her blood pressure with the goal of decreasing her risk of heart attack and stroke. [name] blood pressure is currently stable on HCTZ.    Wt Readings from Last 500 Encounters:  12/25/16 265 lb (120.2 kg)  12/11/16 266 lb  (120.7 kg)  11/27/16 274 lb (124.3 kg)  11/13/16 272 lb (123.4 kg)  10/25/16 278 lb (126.1 kg)  10/10/16 283 lb (128.4 kg)  09/27/16 287 lb 9.6 oz (130.5 kg)     ALLERGIES: Not on File  MEDICATIONS: Current Outpatient Prescriptions on File Prior to Visit  Medication Sig Dispense Refill  . aspirin 81 MG tablet Take 1 tablet (81 mg total) by mouth daily. 30 tablet 0  . hydrochlorothiazide (HYDRODIURIL) 12.5 MG tablet Take 1 tablet (12.5 mg total) by mouth daily. 30 tablet 0  . ibuprofen (ADVIL,MOTRIN) 200 MG tablet Take 200 mg by mouth 4 (four) times daily.    . Multiple Vitamin (MULTIVITAMIN) tablet Take 1 tablet by mouth daily.     No current facility-administered medications on file prior to visit.     PAST MEDICAL HISTORY: Past Medical History:  Diagnosis Date  . Acid reflux   . Anemia   . Back pain   . Gallbladder problem   . High blood pressure   . Hip pain   . Pre-diabetes   . Shortness of breath   . Sleep apnea   . Swelling     PAST SURGICAL HISTORY: Past Surgical History:  Procedure Laterality Date  . CARPAL TUNNEL RELEASE    . GALLBLADDER SURGERY     April 2012  . LASIK     2000    SOCIAL HISTORY: Social History  Substance Use Topics  . Smoking status: Never Smoker  . Smokeless tobacco: Never Used  . Alcohol  use Not on file    FAMILY HISTORY: Family History  Problem Relation Age of Onset  . Diabetes Mother   . Thyroid disease Mother   . Liver disease Mother   . Obesity Mother   . Hypertension Father   . Hyperlipidemia Father   . Heart disease Father   . Stroke Father   . Obesity Father     ROS: Review of Systems  Constitutional: Positive for weight loss.  Respiratory: Negative for shortness of breath (on exertion).   Cardiovascular: Negative for chest pain and claudication.  Gastrointestinal: Negative for nausea and vomiting.  Genitourinary: Negative for frequency.  Musculoskeletal: Negative for myalgias.       Negative muscle  weakness  Neurological: Negative for headaches.  Endo/Heme/Allergies: Negative for polydipsia.       Negative hypoglycemia    PHYSICAL EXAM: Blood pressure 134/74, pulse 61, temperature 98.1 F (36.7 C), temperature source Oral, height 5\' 6"  (1.676 m), weight 265 lb (120.2 kg), last menstrual period 11/25/2016, SpO2 97 %. Body mass index is 42.77 kg/m. Physical Exam  Constitutional: She is oriented to person, place, and time. She appears well-developed and well-nourished.  Cardiovascular: Normal rate.   Pulmonary/Chest: Effort normal.  Musculoskeletal: Normal range of motion.  Neurological: She is oriented to person, place, and time.  Skin: Skin is warm and dry.  Psychiatric: She has a normal mood and affect. Her behavior is normal.  Vitals reviewed.   RECENT LABS AND TESTS: BMET    Component Value Date/Time   NA 144 09/27/2016 1342   K 4.6 09/27/2016 1342   CL 103 09/27/2016 1342   CO2 25 09/27/2016 1342   GLUCOSE 82 09/27/2016 1342   GLUCOSE 94 12/28/2010 1530   BUN 14 09/27/2016 1342   CREATININE 1.12 (H) 09/27/2016 1342   CALCIUM 9.1 09/27/2016 1342   GFRNONAA 57 (L) 09/27/2016 1342   GFRAA 66 09/27/2016 1342   Lab Results  Component Value Date   HGBA1C 5.5 09/27/2016   Lab Results  Component Value Date   INSULIN 20.0 09/27/2016   CBC    Component Value Date/Time   WBC 8.3 09/27/2016 1342   WBC 9.1 12/28/2010 1530   RBC 4.93 09/27/2016 1342   RBC 4.66 12/28/2010 1530   HGB 11.6 (L) 12/28/2010 1530   HCT 40.9 09/27/2016 1342   PLT 193 12/28/2010 1530   MCV 83 09/27/2016 1342   MCH 27.2 09/27/2016 1342   MCH 24.9 (L) 12/28/2010 1530   MCHC 32.8 09/27/2016 1342   MCHC 31.8 12/28/2010 1530   RDW 15.9 (H) 09/27/2016 1342   LYMPHSABS 2.3 09/27/2016 1342   MONOABS 1.1 (H) 11/10/2010 0554   EOSABS 0.6 (H) 09/27/2016 1342   BASOSABS 0.0 09/27/2016 1342   Iron/TIBC/Ferritin/ %Sat No results found for: IRON, TIBC, FERRITIN, IRONPCTSAT Lipid Panel       Component Value Date/Time   CHOL 226 (H) 09/27/2016 1342   TRIG 184 (H) 09/27/2016 1342   HDL 49 09/27/2016 1342   LDLCALC 140 (H) 09/27/2016 1342   Hepatic Function Panel     Component Value Date/Time   PROT 7.0 09/27/2016 1342   ALBUMIN 4.1 09/27/2016 1342   AST 16 09/27/2016 1342   ALT 15 09/27/2016 1342   ALKPHOS 76 09/27/2016 1342   BILITOT <0.2 09/27/2016 1342   BILIDIR 0.1 11/09/2010 1645   IBILI 0.4 11/09/2010 1645      Component Value Date/Time   TSH 1.890 09/27/2016 1342    ASSESSMENT AND PLAN:  Vitamin D deficiency - Plan: VITAMIN D 25 Hydroxy (Vit-D Deficiency, Fractures), Vitamin D, Ergocalciferol, (DRISDOL) 50000 units CAPS capsule  Prediabetes - Plan: Comprehensive metabolic panel, Hemoglobin A1c, Insulin, random  Other hyperlipidemia - Plan: Lipid Panel With LDL/HDL Ratio  Essential hypertension  Insulin resistance - Plan: metFORMIN (GLUCOPHAGE) 500 MG tablet  At risk for diabetes mellitus  Morbid obesity ([place])  PLAN:  Vitamin D Deficiency Nancyjo was informed that low vitamin D levels contributes to fatigue and are associated with obesity, breast, and colon cancer. She agrees to continue to take prescription Vit D @50 ,000 IU every week, we will refill for 1 month. We will check labs and will follow up for routine testing of vitamin D, at least 2-3 times per year. She was informed of the risk of over-replacement of vitamin D and agrees to not increase her dose unless he discusses this with [place] first. Zamaya agrees to follow up with our clinic in 2 weeks.  Pre-Diabetes Chrys will continue to work on weight loss, exercise, and decreasing simple carbohydrates in her diet to help decrease the risk of diabetes. We dicussed metformin including benefits and risks. She was informed that eating too many simple carbohydrates or too many calories at one sitting increases the likelihood of GI side effects. Veida agrees to continue to take metformin for now and a  prescription was written today for 1 month refill. Joany agreed to follow up with [place] as directed to monitor her progress.  Diabetes risk counselling Twyla was given extended (at least 15 minutes) diabetes prevention counseling today. She is 51 y.o. female and has risk factors for diabetes including obesity. We discussed intensive lifestyle modifications today with an emphasis on weight loss as well as increasing exercise and decreasing simple carbohydrates in her diet.  Hyperlipidemia Nataline was informed of the American Heart Association Guidelines emphasizing intensive lifestyle modifications as the first line treatment for hyperlipidemia. We discussed many lifestyle modifications today in depth, and Laruth will continue to work on decreasing saturated fats such as fatty red meat, butter and many fried foods. She will also increase vegetables and lean protein in her diet and continue to work on exercise and weight loss efforts. We will check labs and follow.  Hypertension We discussed sodium restriction, working on healthy weight loss, and a regular exercise program as the means to achieve improved blood pressure control. Maudene agreed with this plan and agreed to follow up as directed. We will continue to monitor her blood pressure as well as her progress with the above lifestyle modifications. She will continue her medications as prescribed, we will refill HCTZ for 1 month and will check labs and Amirah will watch for signs of hypotension as she continues her lifestyle modifications. Arzu agrees to follow up with our clinic in 2 weeks.  Obesity Maren is currently in the action stage of change. As such, her goal is to continue with weight loss efforts She has agreed to keep a food journal with 1200 to 1500 calories and 75 grams of protein daily Amylee has been instructed to work up to a goal of 150 minutes of combined cardio and strengthening exercise per week or start walking 15 to 30 minutes  2 to 3 times per week for weight loss and overall health benefits. We discussed the following Behavioral Modification Stratagies today: increasing lean protein intake, decreasing simple carbohydrates,  increasing lower sugar fruits and increasing fiber rich foods  Damari has agreed to follow up with our clinic in  2 weeks. She was informed of the importance of frequent follow up visits to maximize her success with intensive lifestyle modifications for her multiple health conditions.  I, Doreene Nest, am acting as scribe for Dennard Nip, MD  I have reviewed the above documentation for accuracy and completeness, and I agree with the above. -Dennard Nip, MD

## 2016-12-26 LAB — LIPID PANEL WITH LDL/HDL RATIO
Cholesterol, Total: 198 mg/dL (ref 100–199)
HDL: 46 mg/dL (ref >39)
LDL CALC: 125 mg/dL — AB (ref 0–99)
LDl/HDL Ratio: 2.7 ratio units (ref 0.0–3.2)
Triglycerides: 133 mg/dL (ref 0–149)
VLDL CHOLESTEROL CAL: 27 mg/dL (ref 5–40)

## 2016-12-26 LAB — COMPREHENSIVE METABOLIC PANEL
A/G RATIO: 1.7 (ref 1.2–2.2)
ALBUMIN: 4.3 g/dL (ref 3.5–5.5)
ALK PHOS: 69 IU/L (ref 39–117)
ALT: 13 IU/L (ref 0–32)
AST: 14 IU/L (ref 0–40)
BUN / CREAT RATIO: 14 (ref 9–23)
BUN: 15 mg/dL (ref 6–24)
Bilirubin Total: 0.2 mg/dL (ref 0.0–1.2)
CO2: 25 mmol/L (ref 18–29)
Calcium: 9.4 mg/dL (ref 8.7–10.2)
Chloride: 100 mmol/L (ref 96–106)
Creatinine, Ser: 1.07 mg/dL — ABNORMAL HIGH (ref 0.57–1.00)
GFR calc Af Amer: 69 mL/min/{1.73_m2} (ref >59)
GFR, EST NON AFRICAN AMERICAN: 60 mL/min/{1.73_m2} (ref >59)
Globulin, Total: 2.5 g/dL (ref 1.5–4.5)
Glucose: 80 mg/dL (ref 65–99)
POTASSIUM: 4.4 mmol/L (ref 3.5–5.2)
Sodium: 142 mmol/L (ref 134–144)
Total Protein: 6.8 g/dL (ref 6.0–8.5)

## 2016-12-26 LAB — INSULIN, RANDOM: INSULIN: 16.9 u[IU]/mL (ref 2.6–24.9)

## 2016-12-26 LAB — HEMOGLOBIN A1C
ESTIMATED AVERAGE GLUCOSE: 114 mg/dL
HEMOGLOBIN A1C: 5.6 % (ref 4.8–5.6)

## 2016-12-26 LAB — VITAMIN D 25 HYDROXY (VIT D DEFICIENCY, FRACTURES): VIT D 25 HYDROXY: 32.6 ng/mL (ref 30.0–100.0)

## 2017-01-09 ENCOUNTER — Ambulatory Visit (INDEPENDENT_AMBULATORY_CARE_PROVIDER_SITE_OTHER): Payer: 59 | Admitting: Family Medicine

## 2017-01-09 VITALS — BP 125/82 | HR 67 | Temp 97.7°F | Ht 66.0 in | Wt 266.0 lb

## 2017-01-09 DIAGNOSIS — E782 Mixed hyperlipidemia: Secondary | ICD-10-CM | POA: Diagnosis not present

## 2017-01-09 DIAGNOSIS — E559 Vitamin D deficiency, unspecified: Secondary | ICD-10-CM | POA: Diagnosis not present

## 2017-01-09 NOTE — Progress Notes (Signed)
Office: [phone]  /  Fax: [phone]   HPI:   Chief Complaint: OBESITY Leslie Wright is here to discuss her progress with her obesity treatment plan. She is following her eating plan approximately 50 % of the time and states she is exercising 0 minutes 0 times per week. Leslie Wright had increased celebration eating for her birthday and recently hurt her back and decreased exercise. She is ready to get back on track. Her weight is 266 lb (120.7 kg) today and has had a weight gain of 1 lb over a period of 2 weeks since her last visit. She has lost 21 lbs since starting treatment with [place].  Mixed Hyperlipidemia Leslie Wright has mixed hyperlipidemia. Labs reviewed, LDL and triglycerides improved with diet alone over the last 3 months. She has been trying to improve her cholesterol levels with intensive lifestyle modification including a low saturated fat diet, exercise and weight loss. She denies any chest pain, claudication or myalgias. Although improved, not yet at goal.  Vitamin D deficiency Leslie Wright has a diagnosis of vitamin D deficiency. Labs reviewed, she is currently on prescription vitamin D and is improving nicely but not at goal. She denies nausea, vomiting or muscle weakness.   Wt Readings from Last 500 Encounters:  01/09/17 266 lb (120.7 kg)  12/25/16 265 lb (120.2 kg)  12/11/16 266 lb (120.7 kg)  11/27/16 274 lb (124.3 kg)  11/13/16 272 lb (123.4 kg)  10/25/16 278 lb (126.1 kg)  10/10/16 283 lb (128.4 kg)  09/27/16 287 lb 9.6 oz (130.5 kg)     ALLERGIES: Not on File  MEDICATIONS: Current Outpatient Prescriptions on File Prior to Visit  Medication Sig Dispense Refill  . aspirin 81 MG tablet Take 1 tablet (81 mg total) by mouth daily. 30 tablet 0  . hydrochlorothiazide (HYDRODIURIL) 12.5 MG tablet Take 1 tablet (12.5 mg total) by mouth daily. 30 tablet 0  . ibuprofen (ADVIL,MOTRIN) 200 MG tablet Take 200 mg by mouth 4 (four) times daily.    . metFORMIN (GLUCOPHAGE) 500 MG tablet  Take 1 tablet (500 mg total) by mouth daily with breakfast. 30 tablet 0  . Multiple Vitamin (MULTIVITAMIN) tablet Take 1 tablet by mouth daily.    . Vitamin D, Ergocalciferol, (DRISDOL) 50000 units CAPS capsule Take 1 capsule (50,000 Units total) by mouth every 7 (seven) days. 4 capsule 0   No current facility-administered medications on file prior to visit.     PAST MEDICAL HISTORY: Past Medical History:  Diagnosis Date  . Acid reflux   . Anemia   . Back pain   . Gallbladder problem   . High blood pressure   . Hip pain   . Pre-diabetes   . Shortness of breath   . Sleep apnea   . Swelling     PAST SURGICAL HISTORY: Past Surgical History:  Procedure Laterality Date  . CARPAL TUNNEL RELEASE    . GALLBLADDER SURGERY     April 2012  . LASIK     2000    SOCIAL HISTORY: Social History  Substance Use Topics  . Smoking status: Never Smoker  . Smokeless tobacco: Never Used  . Alcohol use Not on file    FAMILY HISTORY: Family History  Problem Relation Age of Onset  . Diabetes Mother   . Thyroid disease Mother   . Liver disease Mother   . Obesity Mother   . Hypertension Father   . Hyperlipidemia Father   . Heart disease Father   . Stroke Father   .  Obesity Father     ROS: Review of Systems  Constitutional: Negative for weight loss.  Cardiovascular: Negative for chest pain and claudication.  Gastrointestinal: Negative for nausea and vomiting.  Musculoskeletal: Negative for myalgias.       Negative muscle weakness    PHYSICAL EXAM: Blood pressure 125/82, pulse 67, temperature 97.7 F (36.5 C), temperature source Oral, height 5\' 6"  (1.676 m), weight 266 lb (120.7 kg), SpO2 99 %. Body mass index is 42.93 kg/m. Physical Exam  Constitutional: She is oriented to person, place, and time. She appears well-developed and well-nourished.  Cardiovascular: Normal rate.   Pulmonary/Chest: Effort normal.  Musculoskeletal: Normal range of motion.  Neurological: She is  oriented to person, place, and time.  Skin: Skin is warm and dry.  Psychiatric: She has a normal mood and affect. Her behavior is normal.  Vitals reviewed.   RECENT LABS AND TESTS: BMET    Component Value Date/Time   NA 142 12/25/2016 1453   K 4.4 12/25/2016 1453   CL 100 12/25/2016 1453   CO2 25 12/25/2016 1453   GLUCOSE 80 12/25/2016 1453   GLUCOSE 94 12/28/2010 1530   BUN 15 12/25/2016 1453   CREATININE 1.07 (H) 12/25/2016 1453   CALCIUM 9.4 12/25/2016 1453   GFRNONAA 60 12/25/2016 1453   GFRAA 69 12/25/2016 1453   Lab Results  Component Value Date   HGBA1C 5.6 12/25/2016   HGBA1C 5.5 09/27/2016   Lab Results  Component Value Date   INSULIN 16.9 12/25/2016   INSULIN 20.0 09/27/2016   CBC    Component Value Date/Time   WBC 8.3 09/27/2016 1342   WBC 9.1 12/28/2010 1530   RBC 4.93 09/27/2016 1342   RBC 4.66 12/28/2010 1530   HGB 11.6 (L) 12/28/2010 1530   HCT 40.9 09/27/2016 1342   PLT 193 12/28/2010 1530   MCV 83 09/27/2016 1342   MCH 27.2 09/27/2016 1342   MCH 24.9 (L) 12/28/2010 1530   MCHC 32.8 09/27/2016 1342   MCHC 31.8 12/28/2010 1530   RDW 15.9 (H) 09/27/2016 1342   LYMPHSABS 2.3 09/27/2016 1342   MONOABS 1.1 (H) 11/10/2010 0554   EOSABS 0.6 (H) 09/27/2016 1342   BASOSABS 0.0 09/27/2016 1342   Iron/TIBC/Ferritin/ %Sat No results found for: IRON, TIBC, FERRITIN, IRONPCTSAT Lipid Panel     Component Value Date/Time   CHOL 198 12/25/2016 1453   TRIG 133 12/25/2016 1453   HDL 46 12/25/2016 1453   LDLCALC 125 (H) 12/25/2016 1453   Hepatic Function Panel     Component Value Date/Time   PROT 6.8 12/25/2016 1453   ALBUMIN 4.3 12/25/2016 1453   AST 14 12/25/2016 1453   ALT 13 12/25/2016 1453   ALKPHOS 69 12/25/2016 1453   BILITOT 0.2 12/25/2016 1453   BILIDIR 0.1 11/09/2010 1645   IBILI 0.4 11/09/2010 1645      Component Value Date/Time   TSH 1.890 09/27/2016 1342    ASSESSMENT AND PLAN: Mixed hyperlipidemia  Vitamin D  deficiency  Morbid obesity ([place])  PLAN:  Mixed Hyperlipidemia Leslie Wright was informed of the American Heart Association Guidelines emphasizing intensive lifestyle modifications as the first line treatment for hyperlipidemia. We discussed many lifestyle modifications today in depth, and Leslie Wright will continue to work on decreasing saturated fats such as fatty red meat, butter and many fried foods. She will also increase vegetables and lean protein in her diet and continue to work on exercise and weight loss efforts. We will re-check labs in 3 months.  Vitamin  D Deficiency Leslie Wright was informed that low vitamin D levels contributes to fatigue and are associated with obesity, breast, and colon cancer. She agrees to continue to take prescription Vit D @50 ,000 IU every week and we will re-check labs in 3 months and will follow up for routine testing of vitamin D, at least 2-3 times per year. She was informed of the risk of over-replacement of vitamin D and agrees to not increase her dose unless he discusses this with [place] first.  We spent > than 50% of the 30 minute visit on the counseling as documented in the note.  Obesity Leslie Wright is currently in the action stage of change. As such, her goal is to continue with weight loss efforts She has agreed to keep a food journal with 1500 calories and 80 grams of protein daily Leslie Wright has been instructed to work up to a goal of 150 minutes of combined cardio and strengthening exercise per week for weight loss and overall health benefits. We discussed the following Behavioral Modification Stratagies today: increasing lean protein intake, increasing lower sugar fruits and work on meal planning and easy cooking plans  Leslie Wright has agreed to follow up with our clinic in 2 weeks. She was informed of the importance of frequent follow up visits to maximize her success with intensive lifestyle modifications for her multiple health conditions.  I, Doreene Nest, am acting as  scribe for Dennard Nip, MD  I have reviewed the above documentation for accuracy and completeness, and I agree with the above. -Dennard Nip, MD

## 2017-01-21 ENCOUNTER — Ambulatory Visit (INDEPENDENT_AMBULATORY_CARE_PROVIDER_SITE_OTHER): Payer: 59 | Admitting: Family Medicine

## 2017-01-21 VITALS — BP 132/86 | HR 54 | Temp 98.4°F | Ht 66.0 in | Wt 267.0 lb

## 2017-01-21 DIAGNOSIS — E559 Vitamin D deficiency, unspecified: Secondary | ICD-10-CM | POA: Diagnosis not present

## 2017-01-21 MED ORDER — VITAMIN D (ERGOCALCIFEROL) 1.25 MG (50000 UNIT) PO CAPS: 50000.0000 [IU] | ORAL_CAPSULE | ORAL | 0 refills | Status: DC

## 2017-01-21 MED FILL — VIT D2 1.25 MG (50,000 UNIT: 1.25 MG | 28 days supply | Qty: 4 | Fill #0

## 2017-01-22 NOTE — Progress Notes (Signed)
Office: [phone]  /  Fax: [phone]   HPI:   Chief Complaint: OBESITY Leslie Wright is here to discuss her progress with her obesity treatment plan. She is following her eating plan approximately 95 % of the time and states she is exercising 0 minutes 0 times per week. Leslie Wright has had to increase travelling and eating out. She tried to make better choices. She is retaining some fluid and has some increased sodium in diet. Her weight is 267 lb (121.1 kg) today and has had a weight gain of 1 lb over a period of 2 weeks since her last visit. She has lost 20 lbs since starting treatment with [place].  Vitamin D deficiency Leslie Wright has a diagnosis of vitamin D deficiency. She is currently stable on vit D and denies nausea, vomiting or muscle weakness.  Wt Readings from Last 500 Encounters:  01/21/17 267 lb (121.1 kg)  01/09/17 266 lb (120.7 kg)  12/25/16 265 lb (120.2 kg)  12/11/16 266 lb (120.7 kg)  11/27/16 274 lb (124.3 kg)  11/13/16 272 lb (123.4 kg)  10/25/16 278 lb (126.1 kg)  10/10/16 283 lb (128.4 kg)  09/27/16 287 lb 9.6 oz (130.5 kg)     ALLERGIES: Not on File  MEDICATIONS: Current Outpatient Prescriptions on File Prior to Visit  Medication Sig Dispense Refill  . aspirin 81 MG tablet Take 1 tablet (81 mg total) by mouth daily. 30 tablet 0  . hydrochlorothiazide (HYDRODIURIL) 12.5 MG tablet Take 1 tablet (12.5 mg total) by mouth daily. 30 tablet 0  . ibuprofen (ADVIL,MOTRIN) 200 MG tablet Take 200 mg by mouth 4 (four) times daily.    . metFORMIN (GLUCOPHAGE) 500 MG tablet Take 1 tablet (500 mg total) by mouth daily with breakfast. 30 tablet 0  . Multiple Vitamin (MULTIVITAMIN) tablet Take 1 tablet by mouth daily.     No current facility-administered medications on file prior to visit.     PAST MEDICAL HISTORY: Past Medical History:  Diagnosis Date  . Acid reflux   . Anemia   . Back pain   . Gallbladder problem   . High blood pressure   . Hip pain   . Pre-diabetes     . Shortness of breath   . Sleep apnea   . Swelling     PAST SURGICAL HISTORY: Past Surgical History:  Procedure Laterality Date  . CARPAL TUNNEL RELEASE    . GALLBLADDER SURGERY     April 2012  . LASIK     2000    SOCIAL HISTORY: Social History  Substance Use Topics  . Smoking status: Never Smoker  . Smokeless tobacco: Never Used  . Alcohol use Not on file    FAMILY HISTORY: Family History  Problem Relation Age of Onset  . Diabetes Mother   . Thyroid disease Mother   . Liver disease Mother   . Obesity Mother   . Hypertension Father   . Hyperlipidemia Father   . Heart disease Father   . Stroke Father   . Obesity Father     ROS: Review of Systems  Constitutional: Negative for weight loss.  Gastrointestinal: Negative for nausea and vomiting.  Musculoskeletal:       Negative muscle weakness    PHYSICAL EXAM: Blood pressure 132/86, pulse (!) 54, temperature 98.4 F (36.9 C), temperature source Oral, height 5\' 6"  (1.676 m), weight 267 lb (121.1 kg), SpO2 98 %. Body mass index is 43.09 kg/m. Physical Exam  Constitutional: She is oriented to person, place,  and time. She appears well-developed and well-nourished.  Cardiovascular: Normal rate.   Pulmonary/Chest: Effort normal.  Musculoskeletal: Normal range of motion.  Neurological: She is oriented to person, place, and time.  Skin: Skin is warm and dry.  Psychiatric: She has a normal mood and affect. Her behavior is normal.  Vitals reviewed.   RECENT LABS AND TESTS: BMET    Component Value Date/Time   NA 142 12/25/2016 1453   K 4.4 12/25/2016 1453   CL 100 12/25/2016 1453   CO2 25 12/25/2016 1453   GLUCOSE 80 12/25/2016 1453   GLUCOSE 94 12/28/2010 1530   BUN 15 12/25/2016 1453   CREATININE 1.07 (H) 12/25/2016 1453   CALCIUM 9.4 12/25/2016 1453   GFRNONAA 60 12/25/2016 1453   GFRAA 69 12/25/2016 1453   Lab Results  Component Value Date   HGBA1C 5.6 12/25/2016   HGBA1C 5.5 09/27/2016   Lab  Results  Component Value Date   INSULIN 16.9 12/25/2016   INSULIN 20.0 09/27/2016   CBC    Component Value Date/Time   WBC 8.3 09/27/2016 1342   WBC 9.1 12/28/2010 1530   RBC 4.93 09/27/2016 1342   RBC 4.66 12/28/2010 1530   HGB 11.6 (L) 12/28/2010 1530   HCT 40.9 09/27/2016 1342   PLT 193 12/28/2010 1530   MCV 83 09/27/2016 1342   MCH 27.2 09/27/2016 1342   MCH 24.9 (L) 12/28/2010 1530   MCHC 32.8 09/27/2016 1342   MCHC 31.8 12/28/2010 1530   RDW 15.9 (H) 09/27/2016 1342   LYMPHSABS 2.3 09/27/2016 1342   MONOABS 1.1 (H) 11/10/2010 0554   EOSABS 0.6 (H) 09/27/2016 1342   BASOSABS 0.0 09/27/2016 1342   Iron/TIBC/Ferritin/ %Sat No results found for: IRON, TIBC, FERRITIN, IRONPCTSAT Lipid Panel     Component Value Date/Time   CHOL 198 12/25/2016 1453   TRIG 133 12/25/2016 1453   HDL 46 12/25/2016 1453   LDLCALC 125 (H) 12/25/2016 1453   Hepatic Function Panel     Component Value Date/Time   PROT 6.8 12/25/2016 1453   ALBUMIN 4.3 12/25/2016 1453   AST 14 12/25/2016 1453   ALT 13 12/25/2016 1453   ALKPHOS 69 12/25/2016 1453   BILITOT 0.2 12/25/2016 1453   BILIDIR 0.1 11/09/2010 1645   IBILI 0.4 11/09/2010 1645      Component Value Date/Time   TSH 1.890 09/27/2016 1342    ASSESSMENT AND PLAN: Vitamin D deficiency - Plan: Vitamin D, Ergocalciferol, (DRISDOL) 50000 units CAPS capsule  Morbid obesity ([place])  PLAN:  Vitamin D Deficiency Leslie Wright was informed that low vitamin D levels contributes to fatigue and are associated with obesity, breast, and colon cancer. She agrees to continue to take prescription Vit D @50 ,000 IU every week, we will refill for 1 month and will follow up for routine testing of vitamin D, at least 2-3 times per year. She was informed of the risk of over-replacement of vitamin D and agrees to not increase her dose unless he discusses this with [place] first. Leslie Wright agrees to follow up with our clinic in 2 weeks.  We spent > than 50% of the 15  minute visit on the counseling as documented in the note.  Obesity Leslie Wright is currently in the action stage of change. As such, her goal is to continue with weight loss efforts She has agreed to keep a food journal with 1500 calories and 80+ grams of protein daily Leslie Wright has been instructed to work up to a goal of 150 minutes  of combined cardio and strengthening exercise per week for weight loss and overall health benefits. We discussed the following Behavioral Modification Stratagies today: increasing lean protein intake, decreasing sodium intake and work on meal planning and easy cooking plans  Azarie has agreed to follow up with our clinic in 2 weeks. She was informed of the importance of frequent follow up visits to maximize her success with intensive lifestyle modifications for her multiple health conditions.  I, Doreene Nest, am acting as scribe for Dennard Nip, MD  I have reviewed the above documentation for accuracy and completeness, and I agree with the above. -Dennard Nip, MD

## 2017-02-04 ENCOUNTER — Ambulatory Visit (INDEPENDENT_AMBULATORY_CARE_PROVIDER_SITE_OTHER): Payer: 59 | Admitting: Family Medicine

## 2017-02-04 VITALS — BP 136/81 | HR 60 | Temp 97.7°F | Ht 66.0 in | Wt 262.0 lb

## 2017-02-04 DIAGNOSIS — Z9189 Other specified personal risk factors, not elsewhere classified: Secondary | ICD-10-CM | POA: Diagnosis not present

## 2017-02-04 DIAGNOSIS — R7303 Prediabetes: Secondary | ICD-10-CM

## 2017-02-04 DIAGNOSIS — I10 Essential (primary) hypertension: Secondary | ICD-10-CM

## 2017-02-04 DIAGNOSIS — E8881 Metabolic syndrome: Secondary | ICD-10-CM

## 2017-02-04 DIAGNOSIS — E88819 Insulin resistance, unspecified: Secondary | ICD-10-CM

## 2017-02-04 MED ORDER — METFORMIN HCL 500 MG PO TABS: 500.0000 mg | ORAL_TABLET | Freq: Every day | ORAL | 0 refills | Status: DC

## 2017-02-04 MED ORDER — HYDROCHLOROTHIAZIDE 12.5 MG PO TABS
12.5000 mg | ORAL_TABLET | Freq: Every day | ORAL | 0 refills | Status: DC
Start: 2017-02-04 — End: 2017-03-27

## 2017-02-04 NOTE — Progress Notes (Signed)
Office: [phone]  /  Fax: [phone]   HPI:   Chief Complaint: OBESITY Leslie Wright is here to discuss her progress with her obesity treatment plan. She is on the  keep a food journal with 1100 to 1500 calories and 75 protein daily and is following her eating plan approximately 0 % of the time. She states she is exercising 0 minutes 0 times per week. Leslie Wright continues to do well with weight loss. She is journaling on and off and working on increasing lean protein. She is not exercising yet. 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 <arion is doing better planning meals ahead. Her weight is 262 lb (118.8 kg) today and has had a weight loss of 5 pounds over a period of 2 weeks since her last visit. She has lost 25 lbs since starting treatment with [place].  Hypertension Leslie Wright is a 51 y.o. female with hypertension.  Leslie Wright denies chest pain, headache or shortness of breath on exertion. She is working weight loss to help control her blood pressure with the goal of decreasing her risk of heart attack and stroke. [name] blood pressure is currently stable.  Pre-Diabetes Leslie Wright has a diagnosis of prediabetes based on her elevated Hgb A1c and was informed this puts her at greater risk of developing diabetes. She is stable on metformin currently and continues to work on diet and exercise to decrease risk of diabetes. She denies nausea or hypoglycemia.  At risk for diabetes Leslie Wright is at higher than average risk for developing diabetes due to her obesity and pre-diabetes. She currently denies polyuria or polydipsia.  Wt Readings from Last 500 Encounters:  02/04/17 262 lb (118.8 kg)  01/21/17 267 lb (121.1 kg)  01/09/17 266 lb (120.7 kg)  12/25/16 265 lb (120.2 kg)  12/11/16 266 lb (120.7 kg)  11/27/16 274 lb (124.3 kg)  11/13/16 272 lb (123.4 kg)  10/25/16 278 lb (126.1 kg)  10/10/16 283 lb (128.4 kg)  09/27/16 287 lb 9.6 oz (130.5 kg)     ALLERGIES: Not on  File  MEDICATIONS: Current Outpatient Prescriptions on File Prior to Visit  Medication Sig Dispense Refill  . aspirin 81 MG tablet Take 1 tablet (81 mg total) by mouth daily. 30 tablet 0  . hydrochlorothiazide (HYDRODIURIL) 12.5 MG tablet Take 1 tablet (12.5 mg total) by mouth daily. 30 tablet 0  . ibuprofen (ADVIL,MOTRIN) 200 MG tablet Take 200 mg by mouth 4 (four) times daily.    . metFORMIN (GLUCOPHAGE) 500 MG tablet Take 1 tablet (500 mg total) by mouth daily with breakfast. 30 tablet 0  . Multiple Vitamin (MULTIVITAMIN) tablet Take 1 tablet by mouth daily.    . Vitamin D, Ergocalciferol, (DRISDOL) 50000 units CAPS capsule Take 1 capsule (50,000 Units total) by mouth every 7 (seven) days. 4 capsule 0   No current facility-administered medications on file prior to visit.     PAST MEDICAL HISTORY: Past Medical History:  Diagnosis Date  . Acid reflux   . Anemia   . Back pain   . Gallbladder problem   . High blood pressure   . Hip pain   . Pre-diabetes   . Shortness of breath   . Sleep apnea   . Swelling     PAST SURGICAL HISTORY: Past Surgical History:  Procedure Laterality Date  . CARPAL TUNNEL RELEASE    . GALLBLADDER SURGERY     April 2012  . LASIK     2000  SOCIAL HISTORY: Social History  Substance Use Topics  . Smoking status: Never Smoker  . Smokeless tobacco: Never Used  . Alcohol use Not on file    FAMILY HISTORY: Family History  Problem Relation Age of Onset  . Diabetes Mother   . Thyroid disease Mother   . Liver disease Mother   . Obesity Mother   . Hypertension Father   . Hyperlipidemia Father   . Heart disease Father   . Stroke Father   . Obesity Father     ROS: Review of Systems  Constitutional: Positive for weight loss.  Respiratory: Negative for shortness of breath (on exertion).   Cardiovascular: Negative for chest pain.  Gastrointestinal: Negative for nausea.  Genitourinary: Negative for frequency.  Neurological: Negative for  headaches.  Endo/Heme/Allergies: Negative for polydipsia.       Negative hypoglycemia    PHYSICAL EXAM: Blood pressure 136/81, pulse 60, temperature 97.7 F (36.5 C), temperature source Oral, height 5\' 6"  (1.676 m), weight 262 lb (118.8 kg), SpO2 99 %. Body mass index is 42.29 kg/m. Physical Exam  Constitutional: She is oriented to person, place, and time. She appears well-developed and well-nourished.  Cardiovascular: Normal rate.   Pulmonary/Chest: Effort normal.  Musculoskeletal: Normal range of motion.  Neurological: She is oriented to person, place, and time.  Skin: Skin is warm and dry.  Psychiatric: She has a normal mood and affect. Her behavior is normal.  Vitals reviewed.   RECENT LABS AND TESTS: BMET    Component Value Date/Time   NA 142 12/25/2016 1453   K 4.4 12/25/2016 1453   CL 100 12/25/2016 1453   CO2 25 12/25/2016 1453   GLUCOSE 80 12/25/2016 1453   GLUCOSE 94 12/28/2010 1530   BUN 15 12/25/2016 1453   CREATININE 1.07 (H) 12/25/2016 1453   CALCIUM 9.4 12/25/2016 1453   GFRNONAA 60 12/25/2016 1453   GFRAA 69 12/25/2016 1453   Lab Results  Component Value Date   HGBA1C 5.6 12/25/2016   HGBA1C 5.5 09/27/2016   Lab Results  Component Value Date   INSULIN 16.9 12/25/2016   INSULIN 20.0 09/27/2016   CBC    Component Value Date/Time   WBC 8.3 09/27/2016 1342   WBC 9.1 12/28/2010 1530   RBC 4.93 09/27/2016 1342   RBC 4.66 12/28/2010 1530   HGB 11.6 (L) 12/28/2010 1530   HCT 40.9 09/27/2016 1342   PLT 193 12/28/2010 1530   MCV 83 09/27/2016 1342   MCH 27.2 09/27/2016 1342   MCH 24.9 (L) 12/28/2010 1530   MCHC 32.8 09/27/2016 1342   MCHC 31.8 12/28/2010 1530   RDW 15.9 (H) 09/27/2016 1342   LYMPHSABS 2.3 09/27/2016 1342   MONOABS 1.1 (H) 11/10/2010 0554   EOSABS 0.6 (H) 09/27/2016 1342   BASOSABS 0.0 09/27/2016 1342   Iron/TIBC/Ferritin/ %Sat No results found for: IRON, TIBC, FERRITIN, IRONPCTSAT Lipid Panel     Component Value Date/Time     CHOL 198 12/25/2016 1453   TRIG 133 12/25/2016 1453   HDL 46 12/25/2016 1453   LDLCALC 125 (H) 12/25/2016 1453   Hepatic Function Panel     Component Value Date/Time   PROT 6.8 12/25/2016 1453   ALBUMIN 4.3 12/25/2016 1453   AST 14 12/25/2016 1453   ALT 13 12/25/2016 1453   ALKPHOS 69 12/25/2016 1453   BILITOT 0.2 12/25/2016 1453   BILIDIR 0.1 11/09/2010 1645   IBILI 0.4 11/09/2010 1645      Component Value Date/Time   TSH 1.890  09/27/2016 1342    ASSESSMENT AND PLAN: Essential hypertension - Plan: hydrochlorothiazide (HYDRODIURIL) 12.5 MG tablet  Prediabetes - Plan: metFORMIN (GLUCOPHAGE) 500 MG tablet  Insulin resistance  At risk for diabetes mellitus  Morbid obesity ([facility]) - BMI 42.3  PLAN:  Hypertension We discussed sodium restriction, working on healthy weight loss, and a regular exercise program as the means to achieve improved blood pressure control. Zema agreed with this plan and agreed to follow up as directed. We will continue to monitor her blood pressure as well as her progress with the above lifestyle modifications. She agrees to continue HCTZ as prescribed, we will refill for 1 month and she will watch for signs of hypotension as she continues her lifestyle modifications.  Pre-Diabetes Tyreonna will continue to work on weight loss, exercise, and decreasing simple carbohydrates in her diet to help decrease the risk of diabetes. We dicussed metformin including benefits and risks. She was informed that eating too many simple carbohydrates or too many calories at one sitting increases the likelihood of GI side effects. Duaa requested metformin for now and a prescription was written today for 1 month refill. Merrianne agreed to follow up with [place] as directed to monitor her progress.  Diabetes risk counselling Alexias was given extended (at least 15 minutes) diabetes prevention counseling today. She is 51 y.o. female and has risk factors for diabetes including obesity  and pre-diabetes. We discussed intensive lifestyle modifications today with an emphasis on weight loss as well as increasing exercise and decreasing simple carbohydrates in her diet.  Obesity Cailin is currently in the action stage of change. As such, her goal is to continue with weight loss efforts She has agreed to keep a food journal with 1100 to 1500 calories and 80+ grams of protein daily Charma has been instructed to work up to a goal of 150 minutes of combined cardio and strengthening exercise per week for weight loss and overall health benefits. We discussed the following Behavioral Modification Stratagies today: increasing lean protein intake and decreasing simple carbohydrates   Tabita has agreed to follow up with our clinic in 2 to 3 weeks. She was informed of the importance of frequent follow up visits to maximize her success with intensive lifestyle modifications for her multiple health conditions.  I, Doreene Nest, am acting as scribe for Dennard Nip, MD  I have reviewed the above documentation for accuracy and completeness, and I agree with the above. -Dennard Nip, MD

## 2017-02-05 MED FILL — HYDROCHLOROTHIAZIDE 12.5 MG: 12.5 | 30 days supply | Qty: 30 | Fill #0

## 2017-02-05 MED FILL — metFORMIN HCL 500 MG TABS: 500 | 30 days supply | Qty: 30 | Fill #0

## 2017-02-18 ENCOUNTER — Ambulatory Visit (INDEPENDENT_AMBULATORY_CARE_PROVIDER_SITE_OTHER): Payer: 59 | Admitting: Family Medicine

## 2017-02-18 VITALS — BP 110/76 | HR 66 | Temp 98.3°F | Ht 66.0 in | Wt 257.0 lb

## 2017-02-18 DIAGNOSIS — I10 Essential (primary) hypertension: Secondary | ICD-10-CM

## 2017-02-18 DIAGNOSIS — E559 Vitamin D deficiency, unspecified: Secondary | ICD-10-CM

## 2017-02-18 MED ORDER — VITAMIN D (ERGOCALCIFEROL) 1.25 MG (50000 UNIT) PO CAPS: 50000.0000 [IU] | ORAL_CAPSULE | ORAL | 0 refills | Status: DC

## 2017-02-18 MED FILL — VIT D2 1.25 MG (50,000 UNIT: 1.25 MG | 28 days supply | Qty: 4 | Fill #0

## 2017-02-18 NOTE — Progress Notes (Signed)
Office: [phone]  /  Fax: [phone]   HPI:   Chief Complaint: OBESITY Leslie Wright is here to discuss her progress with her obesity treatment plan. She is on the  keep a food journal with 1100-1500 calories and 80g protein  and is following her eating plan approximately 90 % of the time. She states she is exercising on stationary bike for 20 minutes 2 times per week. Leslie Wright continues to do well with weight loss. Has had a dental procedure recently and has had a hard time chewing meat. She has been drinking protein shakes and eating protein ice cream to get her intake of protein. Her weight is 257 lb (116.6 kg) today and has had a weight loss of 5 pounds over a period of 2 weeks since her last visit. She has lost 30 lbs since starting treatment with [place].  Vitamin D deficiency Leslie Wright has a diagnosis of vitamin D deficiency, greatly improved, on weekly prescription Vitamin D. She denies nausea, vomiting or muscle weakness.  Hypertension Leslie Wright is a 51 y.o. female with hypertension.  Leslie Wright denies chest pain or shortness of breath on exertion. She is working weight loss to help control her blood pressure with the goal of decreasing her risk of heart attack and stroke. Leslie Wright blood pressure is currently controlled.   ALLERGIES: Not on File  MEDICATIONS: Current Outpatient Prescriptions on File Prior to Visit  Medication Sig Dispense Refill  . aspirin 81 MG tablet Take 1 tablet (81 mg total) by mouth daily. 30 tablet 0  . hydrochlorothiazide (HYDRODIURIL) 12.5 MG tablet Take 1 tablet (12.5 mg total) by mouth daily. 30 tablet 0  . ibuprofen (ADVIL,MOTRIN) 200 MG tablet Take 200 mg by mouth 4 (four) times daily.    . metFORMIN (GLUCOPHAGE) 500 MG tablet Take 1 tablet (500 mg total) by mouth daily with breakfast. 30 tablet 0  . Multiple Vitamin (MULTIVITAMIN) tablet Take 1 tablet by mouth daily.     No current facility-administered medications on file prior to visit.      PAST MEDICAL HISTORY: Past Medical History:  Diagnosis Date  . Acid reflux   . Anemia   . Back pain   . Gallbladder problem   . High blood pressure   . Hip pain   . Pre-diabetes   . Shortness of breath   . Sleep apnea   . Swelling     PAST SURGICAL HISTORY: Past Surgical History:  Procedure Laterality Date  . CARPAL TUNNEL RELEASE    . GALLBLADDER SURGERY     April 2012  . LASIK     2000    SOCIAL HISTORY: Social History  Substance Use Topics  . Smoking status: Never Smoker  . Smokeless tobacco: Never Used  . Alcohol use Not on file    FAMILY HISTORY: Family History  Problem Relation Age of Onset  . Diabetes Mother   . Thyroid disease Mother   . Liver disease Mother   . Obesity Mother   . Hypertension Father   . Hyperlipidemia Father   . Heart disease Father   . Stroke Father   . Obesity Father     ROS: Review of Systems  Constitutional: Positive for weight loss.  Cardiovascular: Negative for chest pain.  Gastrointestinal: Negative for nausea and vomiting.  Musculoskeletal:       Negative muscle weakness.  Neurological: Negative for dizziness and headaches.    PHYSICAL EXAM: Blood pressure 110/76, pulse 66, temperature 98.3 F (36.8 C),  temperature source Oral, height 5\' 6"  (1.676 m), weight 257 lb (116.6 kg), SpO2 99 %. Body mass index is 41.48 kg/m. Physical Exam  Constitutional: She appears well-developed and well-nourished.  Neck: Normal range of motion.  Cardiovascular: Normal rate.   Pulmonary/Chest: Effort normal.  Musculoskeletal: Normal range of motion.  Skin: Skin is warm and dry.    RECENT LABS AND TESTS: BMET    Component Value Date/Time   NA 142 12/25/2016 1453   K 4.4 12/25/2016 1453   CL 100 12/25/2016 1453   CO2 25 12/25/2016 1453   GLUCOSE 80 12/25/2016 1453   GLUCOSE 94 12/28/2010 1530   BUN 15 12/25/2016 1453   CREATININE 1.07 (H) 12/25/2016 1453   CALCIUM 9.4 12/25/2016 1453   GFRNONAA 60 12/25/2016 1453    GFRAA 69 12/25/2016 1453   Lab Results  Component Value Date   HGBA1C 5.6 12/25/2016   HGBA1C 5.5 09/27/2016   Lab Results  Component Value Date   INSULIN 16.9 12/25/2016   INSULIN 20.0 09/27/2016   CBC    Component Value Date/Time   WBC 8.3 09/27/2016 1342   WBC 9.1 12/28/2010 1530   RBC 4.93 09/27/2016 1342   RBC 4.66 12/28/2010 1530   HGB 11.6 (L) 12/28/2010 1530   HCT 40.9 09/27/2016 1342   PLT 193 12/28/2010 1530   MCV 83 09/27/2016 1342   MCH 27.2 09/27/2016 1342   MCH 24.9 (L) 12/28/2010 1530   MCHC 32.8 09/27/2016 1342   MCHC 31.8 12/28/2010 1530   RDW 15.9 (H) 09/27/2016 1342   LYMPHSABS 2.3 09/27/2016 1342   MONOABS 1.1 (H) 11/10/2010 0554   EOSABS 0.6 (H) 09/27/2016 1342   BASOSABS 0.0 09/27/2016 1342   Iron/TIBC/Ferritin/ %Sat No results found for: IRON, TIBC, FERRITIN, IRONPCTSAT Lipid Panel     Component Value Date/Time   CHOL 198 12/25/2016 1453   TRIG 133 12/25/2016 1453   HDL 46 12/25/2016 1453   LDLCALC 125 (H) 12/25/2016 1453   Hepatic Function Panel     Component Value Date/Time   PROT 6.8 12/25/2016 1453   ALBUMIN 4.3 12/25/2016 1453   AST 14 12/25/2016 1453   ALT 13 12/25/2016 1453   ALKPHOS 69 12/25/2016 1453   BILITOT 0.2 12/25/2016 1453   BILIDIR 0.1 11/09/2010 1645   IBILI 0.4 11/09/2010 1645      Component Value Date/Time   TSH 1.890 09/27/2016 1342    ASSESSMENT AND PLAN: Vitamin D deficiency - Plan: Vitamin D, Ergocalciferol, (DRISDOL) 50000 units CAPS capsule  Essential hypertension  Morbid obesity ([place])  PLAN:  Vitamin D Deficiency Leslie Wright was informed that low vitamin D levels contributes to fatigue and are associated with obesity, breast, and colon cancer. She agrees to continue to take prescription Vit D @50 ,000 IU every week and will follow up for routine testing of vitamin D, at least 2-3 times per year. She was informed of the risk of over-replacement of vitamin D and agrees to not increase her dose unless she  discusses this with [place] first.   Hypertension We discussed sodium restriction, working on healthy weight loss, and a regular exercise program as the means to achieve improved blood pressure control. Leslie Wright agreed with this plan and agreed to follow up as directed. We will continue to monitor her blood pressure as well as her progress with the above lifestyle modifications. She will continue taking Hydrochlorothiazide 12.5 mg as prescribed and will watch for signs of hypotension as she continues her lifestyle modifications.  Obesity  Leslie Wright is currently in the action stage of change. As such, her goal is to continue with weight loss efforts She has agreed to keep a food journal with 1100-1500 calories and 80g protein  Leslie Wright has been instructed to work up to a goal of 150 minutes of combined cardio and strengthening exercise per week for weight loss and overall health benefits. We discussed the following Behavioral Modification Stratagies today: increasing lean protein intake and increase water intake.   Leslie Wright has agreed to follow up with our clinic in 2 weeks. She was informed of the importance of frequent follow up visits to maximize her success with intensive lifestyle modifications for her multiple health conditions.  I, Doreene Nest, am acting as scribe for Dennard Nip, MD  I have reviewed the above documentation for accuracy and completeness, and I agree with the above. -Dennard Nip, MD

## 2017-03-11 ENCOUNTER — Ambulatory Visit (INDEPENDENT_AMBULATORY_CARE_PROVIDER_SITE_OTHER): Payer: 59 | Admitting: Family Medicine

## 2017-03-11 VITALS — BP 120/71 | HR 52 | Temp 97.6°F | Ht 66.0 in | Wt 257.0 lb

## 2017-03-11 DIAGNOSIS — E8881 Metabolic syndrome: Secondary | ICD-10-CM

## 2017-03-11 NOTE — Progress Notes (Signed)
Office: [phone]  /  Fax: [phone]   HPI:   Chief Complaint: OBESITY Leslie Wright is here to discuss her progress with her obesity treatment plan. She is on the  keep a food journal with 1100 to 1500 calories and 80 grams of protein  and is following her eating plan approximately 80 % of the time. She states she is exercising stationary bike and yard work 30 minutes 2 times per week. Leslie Wright did well maintaining weight but has had increased celebration eating. She tried to portion control/smart choices and is ready to get back on track.  Her weight is 257 lb (116.6 kg) today and has maintained weight over a period of 3 weeks since her last visit. She has lost 30 lbs since starting treatment with [place].  Insulin Resistance Leslie Wright has a diagnosis of insulin resistance based on her elevated fasting insulin level >5. Although Leslie Wright's blood glucose readings are still under good control, insulin resistance puts her at greater risk of metabolic syndrome and diabetes. She is stable on metformin currently and continues to work on diet and exercise to decrease risk of diabetes. She is working on decreasing simple carbohydrates and denies nausea, vomiting or hypoglycemia.  ALLERGIES: Not on File  MEDICATIONS: Current Outpatient Prescriptions on File Prior to Visit  Medication Sig Dispense Refill  . aspirin 81 MG tablet Take 1 tablet (81 mg total) by mouth daily. 30 tablet 0  . hydrochlorothiazide (HYDRODIURIL) 12.5 MG tablet Take 1 tablet (12.5 mg total) by mouth daily. 30 tablet 0  . ibuprofen (ADVIL,MOTRIN) 200 MG tablet Take 200 mg by mouth 4 (four) times daily.    . metFORMIN (GLUCOPHAGE) 500 MG tablet Take 1 tablet (500 mg total) by mouth daily with breakfast. 30 tablet 0  . Multiple Vitamin (MULTIVITAMIN) tablet Take 1 tablet by mouth daily.    . Vitamin D, Ergocalciferol, (DRISDOL) 50000 units CAPS capsule Take 1 capsule (50,000 Units total) by mouth every 7 (seven) days. 4 capsule 0   No  current facility-administered medications on file prior to visit.     PAST MEDICAL HISTORY: Past Medical History:  Diagnosis Date  . Acid reflux   . Anemia   . Back pain   . Gallbladder problem   . High blood pressure   . Hip pain   . Pre-diabetes   . Shortness of breath   . Sleep apnea   . Swelling     PAST SURGICAL HISTORY: Past Surgical History:  Procedure Laterality Date  . CARPAL TUNNEL RELEASE    . GALLBLADDER SURGERY     April 2012  . LASIK     2000    SOCIAL HISTORY: Social History  Substance Use Topics  . Smoking status: Never Smoker  . Smokeless tobacco: Never Used  . Alcohol use Not on file    FAMILY HISTORY: Family History  Problem Relation Age of Onset  . Diabetes Mother   . Thyroid disease Mother   . Liver disease Mother   . Obesity Mother   . Hypertension Father   . Hyperlipidemia Father   . Heart disease Father   . Stroke Father   . Obesity Father     ROS: Review of Systems  Constitutional: Negative for weight loss.  Gastrointestinal: Negative for nausea and vomiting.  Endo/Heme/Allergies:       Negative hypoglycemia    PHYSICAL EXAM: Blood pressure 120/71, pulse (!) 52, temperature 97.6 F (36.4 C), temperature source Oral, height 5\' 6"  (1.676 m), weight 257  lb (116.6 kg), SpO2 99 %. Body mass index is 41.48 kg/m. Physical Exam  Constitutional: She is oriented to person, place, and time. She appears well-developed and well-nourished.  Cardiovascular: Normal rate.   Pulmonary/Chest: Effort normal.  Musculoskeletal: Normal range of motion.  Neurological: She is oriented to person, place, and time.  Skin: Skin is warm and dry.  Psychiatric: She has a normal mood and affect. Her behavior is normal.  Vitals reviewed.   RECENT LABS AND TESTS: BMET    Component Value Date/Time   NA 142 12/25/2016 1453   K 4.4 12/25/2016 1453   CL 100 12/25/2016 1453   CO2 25 12/25/2016 1453   GLUCOSE 80 12/25/2016 1453   GLUCOSE 94  12/28/2010 1530   BUN 15 12/25/2016 1453   CREATININE 1.07 (H) 12/25/2016 1453   CALCIUM 9.4 12/25/2016 1453   GFRNONAA 60 12/25/2016 1453   GFRAA 69 12/25/2016 1453   Lab Results  Component Value Date   HGBA1C 5.6 12/25/2016   HGBA1C 5.5 09/27/2016   Lab Results  Component Value Date   INSULIN 16.9 12/25/2016   INSULIN 20.0 09/27/2016   CBC    Component Value Date/Time   WBC 8.3 09/27/2016 1342   WBC 9.1 12/28/2010 1530   RBC 4.93 09/27/2016 1342   RBC 4.66 12/28/2010 1530   HGB 13.4 09/27/2016 1342   HCT 40.9 09/27/2016 1342   PLT 193 12/28/2010 1530   MCV 83 09/27/2016 1342   MCH 27.2 09/27/2016 1342   MCH 24.9 (L) 12/28/2010 1530   MCHC 32.8 09/27/2016 1342   MCHC 31.8 12/28/2010 1530   RDW 15.9 (H) 09/27/2016 1342   LYMPHSABS 2.3 09/27/2016 1342   MONOABS 1.1 (H) 11/10/2010 0554   EOSABS 0.6 (H) 09/27/2016 1342   BASOSABS 0.0 09/27/2016 1342   Iron/TIBC/Ferritin/ %Sat No results found for: IRON, TIBC, FERRITIN, IRONPCTSAT Lipid Panel     Component Value Date/Time   CHOL 198 12/25/2016 1453   TRIG 133 12/25/2016 1453   HDL 46 12/25/2016 1453   LDLCALC 125 (H) 12/25/2016 1453   Hepatic Function Panel     Component Value Date/Time   PROT 6.8 12/25/2016 1453   ALBUMIN 4.3 12/25/2016 1453   AST 14 12/25/2016 1453   ALT 13 12/25/2016 1453   ALKPHOS 69 12/25/2016 1453   BILITOT 0.2 12/25/2016 1453   BILIDIR 0.1 11/09/2010 1645   IBILI 0.4 11/09/2010 1645      Component Value Date/Time   TSH 1.890 09/27/2016 1342    ASSESSMENT AND PLAN: Insulin resistance  Morbid obesity ([place])  PLAN:  Insulin Resistance Leslie Wright will continue to work on weight loss, exercise, and decreasing simple carbohydrates in her diet to help decrease the risk of diabetes. We dicussed metformin including benefits and risks. She was informed that eating too many simple carbohydrates or too many calories at one sitting increases the likelihood of GI side effects. [name] agrees to  continue metformin for now and prescription was written today for 1 month refill. Leslie Wright agreed to follow up with [place] as directed to monitor her progress.  We spent > than 50% of the 15 minute visit on the counseling as documented in the note.  Obesity Leslie Wright is currently in the action stage of change. As such, her goal is to continue with weight loss efforts She has agreed to keep a food journal with 1100 to 1500 calories and 80+ grams of protein  Leslie Wright has been instructed to work up to a goal of  150 minutes of combined cardio and strengthening exercise per week for weight loss and overall health benefits. We discussed the following Behavioral Modification Strategies today: increasing lean protein intake and meal planning & cooking strategies  Leslie Wright has agreed to follow up with our clinic in 2 weeks. She was informed of the importance of frequent follow up visits to maximize her success with intensive lifestyle modifications for her multiple health conditions.  I, Doreene Nest, am acting as scribe for Dennard Nip, MD  I have reviewed the above documentation for accuracy and completeness, and I agree with the above. -Dennard Nip, MD  OBESITY BEHAVIORAL INTERVENTION VISIT  Today's visit was # 12 out of 22.  Starting weight: 287 lbs Starting date: 09/27/16 Today's weight : @257  lbs Today's date: 03/11/2017 Total lbs lost to date: 30 (Patients must lose 7 lbs in the first 6 months to continue with counseling)   ASK: We discussed the diagnosis of obesity with Leslie Wright today and Leslie Wright agreed to give [place] permission to discuss obesity behavioral modification therapy today.  ASSESS: Leslie Wright has the diagnosis of obesity and her BMI today is 41.6 Leslie Wright is in the action stage of change   ADVISE: Leslie Wright was educated on the multiple health risks of obesity as well as the benefit of weight loss to improve her health. She was advised of the need for long term treatment and the  importance of lifestyle modifications.  AGREE: Multiple dietary modification options and treatment options were discussed and  Leslie Wright agreed to keep a food journal with 1100 to 1500 calories and 80+ grams of protein  We discussed the following Behavioral Modification Stratagies today: increasing lean protein intake and meal planning & cooking strategies

## 2017-03-27 ENCOUNTER — Ambulatory Visit (INDEPENDENT_AMBULATORY_CARE_PROVIDER_SITE_OTHER): Payer: 59 | Admitting: Family Medicine

## 2017-03-27 VITALS — BP 126/85 | HR 62 | Temp 97.7°F | Ht 66.0 in | Wt 259.0 lb

## 2017-03-27 DIAGNOSIS — Z6841 Body Mass Index (BMI) 40.0 and over, adult: Secondary | ICD-10-CM | POA: Diagnosis not present

## 2017-03-27 DIAGNOSIS — E669 Obesity, unspecified: Secondary | ICD-10-CM | POA: Diagnosis not present

## 2017-03-27 DIAGNOSIS — IMO0001 Reserved for inherently not codable concepts without codable children: Secondary | ICD-10-CM

## 2017-03-27 DIAGNOSIS — I10 Essential (primary) hypertension: Secondary | ICD-10-CM

## 2017-03-27 DIAGNOSIS — R7303 Prediabetes: Secondary | ICD-10-CM

## 2017-03-27 DIAGNOSIS — E559 Vitamin D deficiency, unspecified: Secondary | ICD-10-CM

## 2017-03-27 DIAGNOSIS — Z9189 Other specified personal risk factors, not elsewhere classified: Secondary | ICD-10-CM | POA: Diagnosis not present

## 2017-03-27 MED ORDER — VITAMIN D (ERGOCALCIFEROL) 1.25 MG (50000 UNIT) PO CAPS: 50000.0000 [IU] | ORAL_CAPSULE | ORAL | 0 refills | Status: DC

## 2017-03-27 MED ORDER — HYDROCHLOROTHIAZIDE 12.5 MG PO TABS: 12.5000 mg | ORAL_TABLET | Freq: Every day | ORAL | 0 refills | Status: DC

## 2017-03-27 MED ORDER — METFORMIN HCL 500 MG PO TABS: 500.0000 mg | ORAL_TABLET | Freq: Every day | ORAL | 0 refills | Status: DC

## 2017-03-27 MED FILL — HYDROCHLOROTHIAZIDE 12.5 MG: 12.5 | 30 days supply | Qty: 30 | Fill #0

## 2017-03-27 MED FILL — metFORMIN HCL 500 MG TABS: 500 | 30 days supply | Qty: 30 | Fill #0

## 2017-03-27 MED FILL — VIT D2 1.25 MG (50,000 UNIT: 1.25 MG | 28 days supply | Qty: 4 | Fill #0

## 2017-03-27 NOTE — Progress Notes (Signed)
Office: [phone]  /  Fax: [phone]   HPI:   Chief Complaint: OBESITY Leslie Wright is here to discuss her progress with her obesity treatment plan. She is on the  keep a food journal with 1100 to 1500 calories and 80+ grams of protein  and is following her eating plan approximately 90 % of the time. She states she is exercising 0 minutes 0 times per week. Leslie Wright has not been keeping a strict food journal. She wants to try a different plan and get back on track. Her weight is 259 lb (117.5 kg) today and has had a weight gain of 2 pounds over a period of 2 weeks since her last visit. She has lost 28 lbs since starting treatment with [place].  Vitamin D deficiency Leslie Wright has a diagnosis of vitamin D deficiency. She is currently taking vit D and denies nausea, vomiting or muscle weakness.  Hypertension Leslie Wright is a 51 y.o. female with hypertension. Her blood pressure is stable. Leslie Wright denies chest pain, headache or shortness of breath on exertion. She is working weight loss to help control her blood pressure with the goal of decreasing her risk of heart attack and stroke. [name] blood pressure is currently controlled.  Pre-Diabetes Leslie Wright has a diagnosis of pre-diabetes based on her elevated Hgb A1c and was informed this puts her at greater risk of developing diabetes. She is taking metformin currently and continues to work on diet and exercise to decrease risk of diabetes. She denies nausea, polyphagia or hypoglycemia.  At risk for diabetes Leslie Wright is at higher than average risk for developing diabetes due to her obesity. She currently denies polyuria or polydipsia.   ALLERGIES: Not on File  MEDICATIONS: Current Outpatient Prescriptions on File Prior to Visit  Medication Sig Dispense Refill  . aspirin 81 MG tablet Take 1 tablet (81 mg total) by mouth daily. 30 tablet 0  . ibuprofen (ADVIL,MOTRIN) 200 MG tablet Take 200 mg by mouth 4 (four) times daily.    . Multiple Vitamin  (MULTIVITAMIN) tablet Take 1 tablet by mouth daily.     No current facility-administered medications on file prior to visit.     PAST MEDICAL HISTORY: Past Medical History:  Diagnosis Date  . Acid reflux   . Anemia   . Back pain   . Gallbladder problem   . High blood pressure   . Hip pain   . Pre-diabetes   . Shortness of breath   . Sleep apnea   . Swelling     PAST SURGICAL HISTORY: Past Surgical History:  Procedure Laterality Date  . CARPAL TUNNEL RELEASE    . GALLBLADDER SURGERY     April 2012  . LASIK     2000    SOCIAL HISTORY: Social History  Substance Use Topics  . Smoking status: Never Smoker  . Smokeless tobacco: Never Used  . Alcohol use Not on file    FAMILY HISTORY: Family History  Problem Relation Age of Onset  . Diabetes Mother   . Thyroid disease Mother   . Liver disease Mother   . Obesity Mother   . Hypertension Father   . Hyperlipidemia Father   . Heart disease Father   . Stroke Father   . Obesity Father     ROS: Review of Systems  Constitutional: Negative for weight loss.  Respiratory: Positive for shortness of breath (on exertion).   Cardiovascular: Negative for chest pain.  Gastrointestinal: Negative for nausea and vomiting.  Genitourinary:  Negative for frequency.  Musculoskeletal:       Negative muscle weakness  Neurological: Negative for headaches.  Endo/Heme/Allergies: Negative for polydipsia.       Negative polyphagia Negative hypoglycemia    PHYSICAL EXAM: Blood pressure 126/85, pulse 62, temperature 97.7 F (36.5 C), temperature source Oral, height 5\' 6"  (1.676 m), weight 259 lb (117.5 kg), SpO2 100 %. Body mass index is 41.8 kg/m. Physical Exam  Constitutional: She is oriented to person, place, and time. She appears well-developed and well-nourished.  Cardiovascular: Normal rate.   Pulmonary/Chest: Effort normal.  Musculoskeletal: Normal range of motion.  Neurological: She is oriented to person, place, and time.    Skin: Skin is warm and dry.  Psychiatric: She has a normal mood and affect. Her behavior is normal.  Vitals reviewed.   RECENT LABS AND TESTS: BMET    Component Value Date/Time   NA 142 12/25/2016 1453   K 4.4 12/25/2016 1453   CL 100 12/25/2016 1453   CO2 25 12/25/2016 1453   GLUCOSE 80 12/25/2016 1453   GLUCOSE 94 12/28/2010 1530   BUN 15 12/25/2016 1453   CREATININE 1.07 (H) 12/25/2016 1453   CALCIUM 9.4 12/25/2016 1453   GFRNONAA 60 12/25/2016 1453   GFRAA 69 12/25/2016 1453   Lab Results  Component Value Date   HGBA1C 5.6 12/25/2016   HGBA1C 5.5 09/27/2016   Lab Results  Component Value Date   INSULIN 16.9 12/25/2016   INSULIN 20.0 09/27/2016   CBC    Component Value Date/Time   WBC 8.3 09/27/2016 1342   WBC 9.1 12/28/2010 1530   RBC 4.93 09/27/2016 1342   RBC 4.66 12/28/2010 1530   HGB 13.4 09/27/2016 1342   HCT 40.9 09/27/2016 1342   PLT 193 12/28/2010 1530   MCV 83 09/27/2016 1342   MCH 27.2 09/27/2016 1342   MCH 24.9 (L) 12/28/2010 1530   MCHC 32.8 09/27/2016 1342   MCHC 31.8 12/28/2010 1530   RDW 15.9 (H) 09/27/2016 1342   LYMPHSABS 2.3 09/27/2016 1342   MONOABS 1.1 (H) 11/10/2010 0554   EOSABS 0.6 (H) 09/27/2016 1342   BASOSABS 0.0 09/27/2016 1342   Iron/TIBC/Ferritin/ %Sat No results found for: IRON, TIBC, FERRITIN, IRONPCTSAT Lipid Panel     Component Value Date/Time   CHOL 198 12/25/2016 1453   TRIG 133 12/25/2016 1453   HDL 46 12/25/2016 1453   LDLCALC 125 (H) 12/25/2016 1453   Hepatic Function Panel     Component Value Date/Time   PROT 6.8 12/25/2016 1453   ALBUMIN 4.3 12/25/2016 1453   AST 14 12/25/2016 1453   ALT 13 12/25/2016 1453   ALKPHOS 69 12/25/2016 1453   BILITOT 0.2 12/25/2016 1453   BILIDIR 0.1 11/09/2010 1645   IBILI 0.4 11/09/2010 1645      Component Value Date/Time   TSH 1.890 09/27/2016 1342    ASSESSMENT AND PLAN: Essential hypertension - Plan: hydrochlorothiazide (HYDRODIURIL) 12.5 MG  tablet  Prediabetes - Plan: metFORMIN (GLUCOPHAGE) 500 MG tablet  Vitamin D deficiency - Plan: Vitamin D, Ergocalciferol, (DRISDOL) 50000 units CAPS capsule  At risk for diabetes mellitus  Class 3 obesity with serious comorbidity and body mass index (BMI) of 40.0 to 44.9 in adult, unspecified obesity type ([place])  PLAN:  Vitamin D Deficiency Oriel was informed that low vitamin D levels contributes to fatigue and are associated with obesity, breast, and colon cancer. She agrees to continue to take prescription Vit D @50 ,000 IU every week and will follow up for  routine testing of vitamin D, at least 2-3 times per year. She was informed of the risk of over-replacement of vitamin D and agrees to not increase her dose unless he discusses this with [place] first.  Hypertension We discussed sodium restriction, working on healthy weight loss, and a regular exercise program as the means to achieve improved blood pressure control. Schae agreed with this plan and agreed to follow up as directed. We will continue to monitor her blood pressure as well as her progress with the above lifestyle modifications. She agrees to continue HCTZ 12.5 mg, we will refill for 1 month and she will watch for signs of hypotension as she continues her lifestyle modifications.   Pre-Diabetes Rachelann will continue to work on weight loss, exercise, and decreasing simple carbohydrates in her diet to help decrease the risk of diabetes. We dicussed metformin including benefits and risks. She was informed that eating too many simple carbohydrates or too many calories at one sitting increases the likelihood of GI side effects. Merin requested metformin for now and a prescription was written today for 1 month refill. Margeret agreed to follow up with [place] as directed to monitor her progress.  Diabetes risk counselling Karisha was given extended (at least 15 minutes) diabetes prevention counseling today. She is 51 y.o. female and has risk  factors for diabetes including obesity and pre-diabetes. We discussed intensive lifestyle modifications today with an emphasis on weight loss as well as increasing exercise and decreasing simple carbohydrates in her diet.  Obesity Elfreda is currently in the action stage of change. As such, her goal is to continue with weight loss efforts She has agreed to follow a lower carbohydrate, vegetable and lean protein rich diet plan Sharunda has been instructed to work up to a goal of 150 minutes of combined cardio and strengthening exercise per week for weight loss and overall health benefits. We discussed the following Behavioral Modification Strategies today: increasing lean protein intake and meal planning & cooking strategies  Danah has agreed to follow up with our clinic in 2 to 3 weeks. She was informed of the importance of frequent follow up visits to maximize her success with intensive lifestyle modifications for her multiple health conditions.  I, Doreene Nest, am acting as transcriptionist for Dennard Nip, MD  I have reviewed the above documentation for accuracy and completeness, and I agree with the above. -Dennard Nip, MD  OBESITY BEHAVIORAL INTERVENTION VISIT  Today's visit was # 13 out of 22.  Starting weight: 287 lbs Starting date: 09/27/17 Today's weight : 259 lbs  Today's date: 03/27/2017 Total lbs lost to date: 93 (Patients must lose 7 lbs in the first 6 months to continue with counseling)   ASK: We discussed the diagnosis of obesity with Ladona Ridgel today and Azizi agreed to give [place] permission to discuss obesity behavioral modification therapy today.  ASSESS: Adelyne has the diagnosis of obesity and her BMI today is 41.9 Yisel is in the action stage of change   ADVISE: Tyrhonda was educated on the multiple health risks of obesity as well as the benefit of weight loss to improve her health. She was advised of the need for long term treatment and the importance of  lifestyle modifications.  AGREE: Multiple dietary modification options and treatment options were discussed and  Taiyana agreed to follow a lower carbohydrate, vegetable and lean protein rich diet plan We discussed the following Behavioral Modification Strategies today: increasing lean protein intake and meal planning & cooking strategies

## 2017-03-29 DIAGNOSIS — H524 Presbyopia: Secondary | ICD-10-CM | POA: Diagnosis not present

## 2017-04-11 ENCOUNTER — Ambulatory Visit (INDEPENDENT_AMBULATORY_CARE_PROVIDER_SITE_OTHER): Payer: 59 | Admitting: Physician Assistant

## 2017-04-11 VITALS — BP 106/73 | HR 64 | Temp 98.2°F | Ht 66.0 in | Wt 252.0 lb

## 2017-04-11 DIAGNOSIS — E785 Hyperlipidemia, unspecified: Secondary | ICD-10-CM | POA: Diagnosis not present

## 2017-04-11 DIAGNOSIS — R7303 Prediabetes: Secondary | ICD-10-CM

## 2017-04-11 DIAGNOSIS — E669 Obesity, unspecified: Secondary | ICD-10-CM

## 2017-04-11 DIAGNOSIS — E559 Vitamin D deficiency, unspecified: Secondary | ICD-10-CM | POA: Diagnosis not present

## 2017-04-11 DIAGNOSIS — IMO0001 Reserved for inherently not codable concepts without codable children: Secondary | ICD-10-CM

## 2017-04-11 DIAGNOSIS — I10 Essential (primary) hypertension: Secondary | ICD-10-CM

## 2017-04-11 DIAGNOSIS — Z6841 Body Mass Index (BMI) 40.0 and over, adult: Secondary | ICD-10-CM

## 2017-04-11 NOTE — Progress Notes (Signed)
Office: [phone]  /  Fax: [phone]   HPI:   Chief Complaint: OBESITY Leslie Wright is here to discuss her progress with her obesity treatment plan. She is on the  follow a lower carbohydrate, vegetable and lean protein rich diet plan and is following her eating plan approximately 80 % of the time. She states she is exercising on stationary bike 30 minutes 2 times per week. Nakiyah continues to do well with weight loss. Would like to add more food choices. Her weight is 252 lb (114.3 kg) today and has had a weight loss of 7 pounds over a period of 2 weeks since her last visit. She has lost 21 lbs since starting treatment with [place].  Hypertension Leslie Wright is a 51 y.o. female with hypertension.  Leslie Wright denies chest pain or shortness of breath on exertion. She is working weight loss to help control her blood pressure with the goal of decreasing her risk of heart attack and stroke. Leslie Wright blood pressure is currently controlled.  Hyperlipidemia Leslie Wright has hyperlipidemia and has been trying to improve her cholesterol levels with intensive lifestyle modification including a low saturated fat diet, exercise and weight loss. She denies any chest pain, claudication or myalgias.  Pre-Diabetes Leslie Wright has a diagnosis of prediabetes based on her elevated HgA1c and was informed this puts her at greater risk of developing diabetes. She is taking metformin currently and continues to work on diet and exercise to decrease risk of diabetes. She denies nausea or hypoglycemia.  Vitamin D deficiency Leslie Wright has a diagnosis of vitamin D deficiency. She is currently taking vit D and denies nausea, vomiting or muscle weakness.   ALLERGIES: Not on File  MEDICATIONS: Current Outpatient Prescriptions on File Prior to Visit  Medication Sig Dispense Refill  . aspirin 81 MG tablet Take 1 tablet (81 mg total) by mouth daily. 30 tablet 0  . hydrochlorothiazide (HYDRODIURIL) 12.5 MG tablet Take 1 tablet  (12.5 mg total) by mouth daily. 30 tablet 0  . ibuprofen (ADVIL,MOTRIN) 200 MG tablet Take 200 mg by mouth 4 (four) times daily.    . metFORMIN (GLUCOPHAGE) 500 MG tablet Take 1 tablet (500 mg total) by mouth daily with breakfast. 30 tablet 0  . Multiple Vitamin (MULTIVITAMIN) tablet Take 1 tablet by mouth daily.    . Vitamin D, Ergocalciferol, (DRISDOL) 50000 units CAPS capsule Take 1 capsule (50,000 Units total) by mouth every 7 (seven) days. 4 capsule 0   No current facility-administered medications on file prior to visit.     PAST MEDICAL HISTORY: Past Medical History:  Diagnosis Date  . Acid reflux   . Anemia   . Back pain   . Gallbladder problem   . High blood pressure   . Hip pain   . Pre-diabetes   . Shortness of breath   . Sleep apnea   . Swelling     PAST SURGICAL HISTORY: Past Surgical History:  Procedure Laterality Date  . CARPAL TUNNEL RELEASE    . GALLBLADDER SURGERY     April 2012  . LASIK     2000    SOCIAL HISTORY: Social History  Substance Use Topics  . Smoking status: Never Smoker  . Smokeless tobacco: Never Used  . Alcohol use Not on file    FAMILY HISTORY: Family History  Problem Relation Age of Onset  . Diabetes Mother   . Thyroid disease Mother   . Liver disease Mother   . Obesity Mother   .  Hypertension Father   . Hyperlipidemia Father   . Heart disease Father   . Stroke Father   . Obesity Father     ROS: Review of Systems  Constitutional: Positive for weight loss.  Respiratory: Negative for shortness of breath.   Cardiovascular: Negative for chest pain and claudication.  Gastrointestinal: Negative for nausea and vomiting.  Musculoskeletal:       Negative muscle weakness  Neurological: Negative for headaches.  Endo/Heme/Allergies: Negative for polydipsia.       Negative polyphagia    PHYSICAL EXAM: Blood pressure 106/73, pulse 64, temperature 98.2 F (36.8 C), temperature source Oral, height 5\' 6"  (1.676 m), weight 252 lb  (114.3 kg), SpO2 98 %. Body mass index is 40.67 kg/m. Physical Exam  Constitutional: She is oriented to person, place, and time. She appears well-developed and well-nourished.  Cardiovascular: Normal rate.   Pulmonary/Chest: Effort normal.  Musculoskeletal: Normal range of motion.  Neurological: She is alert and oriented to person, place, and time.  Skin: Skin is warm and dry.    RECENT LABS AND TESTS: BMET    Component Value Date/Time   NA 141 04/11/2017 0919   K 4.2 04/11/2017 0919   CL 97 04/11/2017 0919   CO2 26 04/11/2017 0919   GLUCOSE 92 04/11/2017 0919   GLUCOSE 94 12/28/2010 1530   BUN 21 04/11/2017 0919   CREATININE 1.16 (H) 04/11/2017 0919   CALCIUM 10.3 (H) 04/11/2017 0919   GFRNONAA 54 (L) 04/11/2017 0919   GFRAA 63 04/11/2017 0919   Lab Results  Component Value Date   HGBA1C 5.5 04/11/2017   HGBA1C 5.6 12/25/2016   HGBA1C 5.5 09/27/2016   Lab Results  Component Value Date   INSULIN 20.4 04/11/2017   INSULIN 16.9 12/25/2016   INSULIN 20.0 09/27/2016   CBC    Component Value Date/Time   WBC 8.3 09/27/2016 1342   WBC 9.1 12/28/2010 1530   RBC 4.93 09/27/2016 1342   RBC 4.66 12/28/2010 1530   HGB 13.4 09/27/2016 1342   HCT 40.9 09/27/2016 1342   PLT 193 12/28/2010 1530   MCV 83 09/27/2016 1342   MCH 27.2 09/27/2016 1342   MCH 24.9 (L) 12/28/2010 1530   MCHC 32.8 09/27/2016 1342   MCHC 31.8 12/28/2010 1530   RDW 15.9 (H) 09/27/2016 1342   LYMPHSABS 2.3 09/27/2016 1342   MONOABS 1.1 (H) 11/10/2010 0554   EOSABS 0.6 (H) 09/27/2016 1342   BASOSABS 0.0 09/27/2016 1342   Iron/TIBC/Ferritin/ %Sat No results found for: IRON, TIBC, FERRITIN, IRONPCTSAT Lipid Panel     Component Value Date/Time   CHOL 217 (H) 04/11/2017 0919   TRIG 173 (H) 04/11/2017 0919   HDL 46 04/11/2017 0919   LDLCALC 136 (H) 04/11/2017 0919   Hepatic Function Panel     Component Value Date/Time   PROT 7.3 04/11/2017 0919   ALBUMIN 4.4 04/11/2017 0919   AST 21  04/11/2017 0919   ALT 20 04/11/2017 0919   ALKPHOS 68 04/11/2017 0919   BILITOT 0.3 04/11/2017 0919   BILIDIR 0.1 11/09/2010 1645   IBILI 0.4 11/09/2010 1645      Component Value Date/Time   TSH 1.890 09/27/2016 1342    ASSESSMENT AND PLAN:  Essential hypertension - Plan: Comprehensive metabolic panel  Hyperlipidemia, unspecified hyperlipidemia type - Plan: Lipid Panel With LDL/HDL Ratio  Prediabetes - Plan: Hemoglobin A1c, Insulin, random  Vitamin D deficiency - Plan: VITAMIN D 25 Hydroxy (Vit-D Deficiency, Fractures)  Class 3 obesity with serious comorbidity and  body mass index (BMI) of 40.0 to 44.9 in adult, unspecified obesity type ([place])  PLAN:  Hypertension We discussed sodium restriction, working on healthy weight loss, and a regular exercise program as the means to achieve improved blood pressure control. Leslie Wright agreed with this plan and agreed to follow up as directed. We will continue to monitor her blood pressure as well as her progress with the above lifestyle modifications. She will continue her medications as prescribed and will watch for signs of hypotension as she continues her lifestyle modifications. We will check labs today.  Hyperlipidemia Leslie Wright was informed of the American Heart Association Guidelines emphasizing intensive lifestyle modifications as the first line treatment for hyperlipidemia. We discussed many lifestyle modifications today in depth, and Leslie Wright will continue to work on decreasing saturated fats such as fatty red meat, butter and many fried foods. She will also increase vegetables and lean protein in her diet and continue to work on exercise and weight loss efforts. We will check labs today.  Pre-Diabetes Leslie Wright will continue to work on weight loss, exercise, and decreasing simple carbohydrates in her diet to help decrease the risk of diabetes. We dicussed metformin including benefits and risks. She was informed that eating too many simple  carbohydrates or too many calories at one sitting increases the likelihood of GI side effects. Leslie Wright will continue to take Metformin as prescribed. Leslie Wright agreed to follow up with [place] as directed, will recheck labs today.  Vitamin D Deficiency Leslie Wright was informed that low vitamin D levels contributes to fatigue and are associated with obesity, breast, and colon cancer. She agrees to continue to take prescription Vit D @50 ,000 IU every week, we will recheck labs today, and will follow up for routine testing of vitamin D, at least 2-3 times per year. She was informed of the risk of over-replacement of vitamin D and agrees to not increase her dose unless he discusses this with [place] first.  Obesity Leslie Wright is currently in the action stage of change. As such, her goal is to continue with weight loss efforts She has agreed to keep a food journal with 1100-1500 calories and 80+  protein  Leslie Wright has been instructed to work up to a goal of 150 minutes of combined cardio and strengthening exercise per week for weight loss and overall health benefits. We discussed the following Behavioral Modification Stratagies today: increasing lean protein intake and work on meal planning and easy cooking plans   Leslie Wright has agreed to follow up with our clinic in 3 weeks. She was informed of the importance of frequent follow up visits to maximize her success with intensive lifestyle modifications for her multiple health conditions.  Office: [phone]  /  Fax: [phone]  OBESITY BEHAVIORAL INTERVENTION VISIT  Today's visit was # 11 out of 22.  Starting weight: 287 Starting date: 09/27/16 Today's weight : Weight: 252 lb (114.3 kg)  Today's date: 04/18/2017 Total lbs lost to date: 15 (Patients must lose 7 lbs in the first 6 months to continue with counseling)   ASK: We discussed the diagnosis of obesity with Leslie Wright today and Leslie Wright agreed to give [place] permission to discuss obesity behavioral modification  therapy today.  ASSESS: Leslie Wright has the diagnosis of obesity and her BMI today is 40.8 Leslie Wright is in the action stage of change   ADVISE: Leslie Wright was educated on the multiple health risks of obesity as well as the benefit of weight loss to improve her health. She was advised of the need  for long term treatment and the importance of lifestyle modifications.  AGREE: Multiple dietary modification options and treatment options were discussed and  Leslie Wright agreed to keep a food journal with 1300-1500 calories and 80+g protein  We discussed the following Behavioral Modification Stratagies today: increasing lean protein intake and work on meal planning and easy cooking plans   I have reviewed the above documentation for accuracy and completeness, and I agree with the above. -Lacy Duverney, PA-C  I have reviewed the above note and agree with the plan. -Dennard Nip, MD

## 2017-04-12 LAB — COMPREHENSIVE METABOLIC PANEL
ALBUMIN: 4.4 g/dL (ref 3.5–5.5)
ALK PHOS: 68 IU/L (ref 39–117)
ALT: 20 IU/L (ref 0–32)
AST: 21 IU/L (ref 0–40)
Albumin/Globulin Ratio: 1.5 (ref 1.2–2.2)
BILIRUBIN TOTAL: 0.3 mg/dL (ref 0.0–1.2)
BUN / CREAT RATIO: 18 (ref 9–23)
BUN: 21 mg/dL (ref 6–24)
CHLORIDE: 97 mmol/L (ref 96–106)
CO2: 26 mmol/L (ref 20–29)
CREATININE: 1.16 mg/dL — AB (ref 0.57–1.00)
Calcium: 10.3 mg/dL — ABNORMAL HIGH (ref 8.7–10.2)
GFR calc Af Amer: 63 mL/min/{1.73_m2} (ref >59)
GFR calc non Af Amer: 54 mL/min/{1.73_m2} — ABNORMAL LOW (ref >59)
GLUCOSE: 92 mg/dL (ref 65–99)
Globulin, Total: 2.9 g/dL (ref 1.5–4.5)
Potassium: 4.2 mmol/L (ref 3.5–5.2)
Sodium: 141 mmol/L (ref 134–144)
Total Protein: 7.3 g/dL (ref 6.0–8.5)

## 2017-04-12 LAB — LIPID PANEL WITH LDL/HDL RATIO
CHOLESTEROL TOTAL: 217 mg/dL — AB (ref 100–199)
HDL: 46 mg/dL (ref >39)
LDL CALC: 136 mg/dL — AB (ref 0–99)
LDl/HDL Ratio: 3 ratio (ref 0.0–3.2)
TRIGLYCERIDES: 173 mg/dL — AB (ref 0–149)
VLDL Cholesterol Cal: 35 mg/dL (ref 5–40)

## 2017-04-12 LAB — HEMOGLOBIN A1C
ESTIMATED AVERAGE GLUCOSE: 111 mg/dL
HEMOGLOBIN A1C: 5.5 % (ref 4.8–5.6)

## 2017-04-12 LAB — INSULIN, RANDOM: INSULIN: 20.4 u[IU]/mL (ref 2.6–24.9)

## 2017-04-12 LAB — VITAMIN D 25 HYDROXY (VIT D DEFICIENCY, FRACTURES): Vit D, 25-Hydroxy: 38.5 ng/mL (ref 30.0–100.0)

## 2017-05-02 ENCOUNTER — Ambulatory Visit (INDEPENDENT_AMBULATORY_CARE_PROVIDER_SITE_OTHER): Payer: 59 | Admitting: Physician Assistant

## 2017-05-02 VITALS — BP 120/76 | HR 61 | Temp 97.3°F | Ht 66.0 in | Wt 253.0 lb

## 2017-05-02 DIAGNOSIS — IMO0001 Reserved for inherently not codable concepts without codable children: Secondary | ICD-10-CM

## 2017-05-02 DIAGNOSIS — E669 Obesity, unspecified: Secondary | ICD-10-CM

## 2017-05-02 DIAGNOSIS — Z6841 Body Mass Index (BMI) 40.0 and over, adult: Secondary | ICD-10-CM

## 2017-05-02 DIAGNOSIS — E8881 Metabolic syndrome: Secondary | ICD-10-CM | POA: Diagnosis not present

## 2017-05-02 DIAGNOSIS — E559 Vitamin D deficiency, unspecified: Secondary | ICD-10-CM

## 2017-05-02 DIAGNOSIS — E782 Mixed hyperlipidemia: Secondary | ICD-10-CM | POA: Diagnosis not present

## 2017-05-02 DIAGNOSIS — Z9189 Other specified personal risk factors, not elsewhere classified: Secondary | ICD-10-CM | POA: Diagnosis not present

## 2017-05-02 DIAGNOSIS — I10 Essential (primary) hypertension: Secondary | ICD-10-CM

## 2017-05-02 MED ORDER — VITAMIN D (ERGOCALCIFEROL) 1.25 MG (50000 UNIT) PO CAPS: 50000.0000 [IU] | ORAL_CAPSULE | ORAL | 0 refills | Status: DC

## 2017-05-02 MED ORDER — METFORMIN HCL 500 MG PO TABS: 500.0000 mg | ORAL_TABLET | Freq: Two times a day (BID) | ORAL | 0 refills | Status: DC

## 2017-05-02 MED ORDER — HYDROCHLOROTHIAZIDE 12.5 MG PO TABS: 12.5000 mg | ORAL_TABLET | Freq: Every day | ORAL | 0 refills | Status: DC

## 2017-05-02 MED ORDER — ATORVASTATIN CALCIUM 10 MG PO TABS: 10.0000 mg | ORAL_TABLET | Freq: Every day | ORAL | 0 refills | Status: DC

## 2017-05-02 MED FILL — HYDROCHLOROTHIAZIDE 12.5 MG: 12.5 | 30 days supply | Qty: 30 | Fill #0

## 2017-05-02 MED FILL — metFORMIN HCL 500 MG TABS: 500 | 30 days supply | Qty: 60 | Fill #0

## 2017-05-02 MED FILL — VIT D2 1.25 MG (50,000 UNIT: 1.25 MG | 28 days supply | Qty: 4 | Fill #0

## 2017-05-02 MED FILL — ATORVASTATIN 10 MG TABLET: 10 | 30 days supply | Qty: 30 | Fill #0

## 2017-05-02 NOTE — Progress Notes (Addendum)
Office: [phone]  /  Fax: [phone]   HPI:   Chief Complaint: OBESITY Leslie Wright is here to discuss her progress with her obesity treatment plan. She is on the keep a food journal with 1300 to 1500 calories and 80+ grams of protein plan and is following her eating plan approximately 25 % of the time. She states she is exercising 0 minutes 0 times per week. Leslie Wright has gotten off track and is not keeping a strict food journal. She is ready to get back on track. Her weight is 253 lb (114.8 kg) today and has had a weight gain of 1 pound over a period of 3 weeks since her last visit. She has lost 34 lbs since starting treatment with [place].  Vitamin D deficiency Leslie Wright has a diagnosis of vitamin D deficiency. She is currently taking vit D and denies nausea, vomiting or muscle weakness.  Insulin Resistance Leslie Wright has a diagnosis of insulin resistance based on her elevated fasting insulin level >5. Her A1c is improved and although Leslie Wright's blood glucose readings are still under good control, insulin resistance puts her at greater risk of metabolic syndrome and diabetes. She notes polyphagia in the evening. She is taking metformin currently and continues to work on diet and exercise to decrease risk of diabetes.  Hypertension Leslie Wright is a 51 y.o. female with hypertension.  Leslie Wright denies chest pain or shortness of breath on exertion. She is working weight loss to help control her blood pressure with the goal of decreasing her risk of heart attack and stroke. [name] blood pressure is currently controlled.  Mixed Hyperlipidemia Leslie Wright has hyperlipidemia, LDL is >100 and triglycerides are >150 and she has a long family history of hyperlipidemia. She has been trying to improve her cholesterol levels with intensive lifestyle modification including a low saturated fat diet, exercise and weight loss. She denies any chest pain, claudication or myalgias.  At risk for cardiovascular  disease Leslie Wright is at a higher than average risk for cardiovascular disease due to obesity, hypertension and mixed hyperlipidemia. She currently denies any chest pain.  ALLERGIES: Not on File  MEDICATIONS: Current Outpatient Prescriptions on File Prior to Visit  Medication Sig Dispense Refill  . aspirin 81 MG tablet Take 1 tablet (81 mg total) by mouth daily. 30 tablet 0  . hydrochlorothiazide (HYDRODIURIL) 12.5 MG tablet Take 1 tablet (12.5 mg total) by mouth daily. 30 tablet 0  . ibuprofen (ADVIL,MOTRIN) 200 MG tablet Take 200 mg by mouth 4 (four) times daily.    . Multiple Vitamin (MULTIVITAMIN) tablet Take 1 tablet by mouth daily.    . Vitamin D, Ergocalciferol, (DRISDOL) 50000 units CAPS capsule Take 1 capsule (50,000 Units total) by mouth every 7 (seven) days. 4 capsule 0   No current facility-administered medications on file prior to visit.     PAST MEDICAL HISTORY: Past Medical History:  Diagnosis Date  . Acid reflux   . Anemia   . Back pain   . Gallbladder problem   . High blood pressure   . Hip pain   . Pre-diabetes   . Shortness of breath   . Sleep apnea   . Swelling     PAST SURGICAL HISTORY: Past Surgical History:  Procedure Laterality Date  . CARPAL TUNNEL RELEASE    . GALLBLADDER SURGERY     April 2012  . LASIK     2000    SOCIAL HISTORY: Social History  Substance Use Topics  . Smoking status:  Never Smoker  . Smokeless tobacco: Never Used  . Alcohol use Not on file    FAMILY HISTORY: Family History  Problem Relation Age of Onset  . Diabetes Mother   . Thyroid disease Mother   . Liver disease Mother   . Obesity Mother   . Hypertension Father   . Hyperlipidemia Father   . Heart disease Father   . Stroke Father   . Obesity Father     ROS: Review of Systems  Constitutional: Negative for weight loss.  Respiratory: Negative for shortness of breath (on exertion).   Cardiovascular: Negative for chest pain and claudication.  Gastrointestinal:  Negative for nausea and vomiting.  Musculoskeletal: Negative for myalgias.       Negative muscle weakness  Endo/Heme/Allergies:       Polyphagia     PHYSICAL EXAM: Blood pressure 120/76, pulse 61, temperature (!) 97.3 F (36.3 C), temperature source Oral, height 5\' 6"  (1.676 m), weight 253 lb (114.8 kg), SpO2 99 %. Body mass index is 40.84 kg/m. Physical Exam  Constitutional: She is oriented to person, place, and time. She appears well-developed and well-nourished.  Cardiovascular: Normal rate.   Pulmonary/Chest: Effort normal.  Musculoskeletal: Normal range of motion.  Neurological: She is oriented to person, place, and time.  Skin: Skin is warm and dry.  Psychiatric: She has a normal mood and affect. Her behavior is normal.  Vitals reviewed.   RECENT LABS AND TESTS: BMET    Component Value Date/Time   NA 141 04/11/2017 0919   K 4.2 04/11/2017 0919   CL 97 04/11/2017 0919   CO2 26 04/11/2017 0919   GLUCOSE 92 04/11/2017 0919   GLUCOSE 94 12/28/2010 1530   BUN 21 04/11/2017 0919   CREATININE 1.16 (H) 04/11/2017 0919   CALCIUM 10.3 (H) 04/11/2017 0919   GFRNONAA 54 (L) 04/11/2017 0919   GFRAA 63 04/11/2017 0919   Lab Results  Component Value Date   HGBA1C 5.5 04/11/2017   HGBA1C 5.6 12/25/2016   HGBA1C 5.5 09/27/2016   Lab Results  Component Value Date   INSULIN 20.4 04/11/2017   INSULIN 16.9 12/25/2016   INSULIN 20.0 09/27/2016   CBC    Component Value Date/Time   WBC 8.3 09/27/2016 1342   WBC 9.1 12/28/2010 1530   RBC 4.93 09/27/2016 1342   RBC 4.66 12/28/2010 1530   HGB 13.4 09/27/2016 1342   HCT 40.9 09/27/2016 1342   PLT 193 12/28/2010 1530   MCV 83 09/27/2016 1342   MCH 27.2 09/27/2016 1342   MCH 24.9 (L) 12/28/2010 1530   MCHC 32.8 09/27/2016 1342   MCHC 31.8 12/28/2010 1530   RDW 15.9 (H) 09/27/2016 1342   LYMPHSABS 2.3 09/27/2016 1342   MONOABS 1.1 (H) 11/10/2010 0554   EOSABS 0.6 (H) 09/27/2016 1342   BASOSABS 0.0 09/27/2016 1342    Iron/TIBC/Ferritin/ %Sat No results found for: IRON, TIBC, FERRITIN, IRONPCTSAT Lipid Panel     Component Value Date/Time   CHOL 217 (H) 04/11/2017 0919   TRIG 173 (H) 04/11/2017 0919   HDL 46 04/11/2017 0919   LDLCALC 136 (H) 04/11/2017 0919   Hepatic Function Panel     Component Value Date/Time   PROT 7.3 04/11/2017 0919   ALBUMIN 4.4 04/11/2017 0919   AST 21 04/11/2017 0919   ALT 20 04/11/2017 0919   ALKPHOS 68 04/11/2017 0919   BILITOT 0.3 04/11/2017 0919   BILIDIR 0.1 11/09/2010 1645   IBILI 0.4 11/09/2010 1645      Component  Value Date/Time   TSH 1.890 09/27/2016 1342    ASSESSMENT AND PLAN: Insulin resistance - Plan: metFORMIN (GLUCOPHAGE) 500 MG tablet  Mixed hyperlipidemia - Plan: atorvastatin (LIPITOR) 10 MG tablet  Essential hypertension - Plan: hydrochlorothiazide (HYDRODIURIL) 12.5 MG tablet  Vitamin D deficiency - Plan: Vitamin D, Ergocalciferol, (DRISDOL) 50000 units CAPS capsule  At risk for heart disease  Class 3 obesity with serious comorbidity and body mass index (BMI) of 40.0 to 44.9 in adult, unspecified obesity type (HCC)  PLAN:  Vitamin D Deficiency Destyni was informed that low vitamin D levels contributes to fatigue and are associated with obesity, breast, and colon cancer. She agrees to continue to take prescription Vit D @50 ,000 IU every week, we will refill for 1 month and will follow up for routine testing of vitamin D, at least 2-3 times per year. She was informed of the risk of over-replacement of vitamin D and agrees to not increase her dose unless he discusses this with [place] first. Chemeka agrees to follow up with our clinic in 3 weeks.  Insulin Resistance Maliya will continue to work on weight loss, exercise, and decreasing simple carbohydrates in her diet to help decrease the risk of diabetes. We dicussed metformin including benefits and risks. She was informed that eating too many simple carbohydrates or too many calories at one  sitting increases the likelihood of GI side effects. Danyale agrees to increase metformin to 500 mg bid #60 with no refills and will follow up with [place] as directed to monitor her progress.  Hypertension We discussed sodium restriction, working on healthy weight loss, and a regular exercise program as the means to achieve improved blood pressure control. [place] agreed with this plan and agreed to follow up as directed. We will continue to monitor her blood pressure as well as her progress with the above lifestyle modifications. She agrees to continue HCTZ 12.5 mg #30 with no refills and will watch for signs of hypotension as she continues her lifestyle modifications.  Mixed Hyperlipidemia Fredricka was informed of the American Heart Association Guidelines emphasizing intensive lifestyle modifications as the first line treatment for hyperlipidemia. We discussed many lifestyle modifications today in depth, and Akshitha will continue to work on decreasing saturated fats such as fatty red meat, butter and many fried foods. She will also increase vegetables and lean protein in her diet and continue to work on exercise and weight loss efforts. Kealy agrees to start Lipitor 10 mg daily #30 with no refills and follow up with our clinic in 3 weeks.  At risk for cardiovascular disease Neysha is at a higher than average risk for cardiovascular disease due to obesity, hypertension and mixed hyperlipidemia. She currently denies any chest pain.  Obesity Cambelle is currently in the action stage of change. As such, her goal is to continue with weight loss efforts She has agreed to keep a food journal with 1300 calories and 80 grams of protein daily Mikeyla has been instructed to work up to a goal of 150 minutes of combined cardio and strengthening exercise per week for weight loss and overall health benefits. We discussed the following Behavioral Modification Strategies today: increasing lean protein intake and keep a strict  food journal  Siria has agreed to follow up with our clinic in 3 weeks. She was informed of the importance of frequent follow up visits to maximize her success with intensive lifestyle modifications for her multiple health conditions.  Corey Skains, am acting as Location manager for [employer],  PA-C  I have reviewed the above documentation for accuracy and completeness, and I agree with the above. -Lacy Duverney, PA-C  I have reviewed the above note and agree with the plan. -Dennard Nip, MD   OBESITY BEHAVIORAL INTERVENTION VISIT  Today's visit was # 15 out of 29.  Starting weight: 287 lbs Starting date: 09/27/16 Today's weight : 253 lbs Today's date: 05/02/2017 Total lbs lost to date: 57 (Patients must lose 7 lbs in the first 6 months to continue with counseling)   ASK: We discussed the diagnosis of obesity with Ladona Ridgel today and Edin agreed to give [place] permission to discuss obesity behavioral modification therapy today.  ASSESS: Christin has the diagnosis of obesity and her BMI today is 40.9 Shakti is in the action stage of change   ADVISE: Timberly was educated on the multiple health risks of obesity as well as the benefit of weight loss to improve her health. She was advised of the need for long term treatment and the importance of lifestyle modifications.  AGREE: Multiple dietary modification options and treatment options were discussed and  Sina agreed to keep a food journal with 1300 calories and 80 grams of protein daily We discussed the following Behavioral Modification Strategies today: increasing lean protein intake and keep a strict food journal

## 2017-05-21 ENCOUNTER — Ambulatory Visit (INDEPENDENT_AMBULATORY_CARE_PROVIDER_SITE_OTHER): Payer: 59 | Admitting: Physician Assistant

## 2017-05-21 VITALS — BP 114/66 | HR 51 | Temp 98.1°F | Ht 66.0 in | Wt 262.0 lb

## 2017-05-21 DIAGNOSIS — R7303 Prediabetes: Secondary | ICD-10-CM

## 2017-05-21 DIAGNOSIS — E669 Obesity, unspecified: Secondary | ICD-10-CM

## 2017-05-21 DIAGNOSIS — Z6841 Body Mass Index (BMI) 40.0 and over, adult: Secondary | ICD-10-CM | POA: Diagnosis not present

## 2017-05-21 DIAGNOSIS — IMO0001 Reserved for inherently not codable concepts without codable children: Secondary | ICD-10-CM

## 2017-05-21 NOTE — Progress Notes (Addendum)
Office: [phone]  /  Fax: [phone]   HPI:   Chief Complaint: OBESITY Leslie Wright is here to discuss her progress with her obesity treatment plan. She is on the keep a food journal with 1300 calories and 80 grams of protein daily and is following her eating plan approximately 0 % of the time. She states she is exercising 0 minutes 0 times per week. Leslie Wright has gotten off track with a busy work schedule and school schedule. She is ready to get back on track. Her weight is 262 lb (118.8 kg) today and she has had a weight gain of 9 pounds over a period of 2 to 3 weeks since her last visit. She has lost 25 lbs since starting treatment with [place].  Pre-Diabetes Leslie Wright has a diagnosis of pre-diabetes based on her elevated Hgb A1c and was informed this puts her at greater risk of developing diabetes. She started taking metformin twice daily but had diarrhea and nausea and is now on 1 pill daily. She continues to work on diet and exercise to decrease risk of diabetes. She denies hypoglycemia.   ALLERGIES: Not on File  MEDICATIONS: Current Outpatient Prescriptions on File Prior to Visit  Medication Sig Dispense Refill  . aspirin 81 MG tablet Take 1 tablet (81 mg total) by mouth daily. 30 tablet 0  . atorvastatin (LIPITOR) 10 MG tablet Take 1 tablet (10 mg total) by mouth daily. 30 tablet 0  . hydrochlorothiazide (HYDRODIURIL) 12.5 MG tablet Take 1 tablet (12.5 mg total) by mouth daily. 30 tablet 0  . ibuprofen (ADVIL,MOTRIN) 200 MG tablet Take 200 mg by mouth 4 (four) times daily.    . metFORMIN (GLUCOPHAGE) 500 MG tablet Take 1 tablet (500 mg total) by mouth 2 (two) times daily with a meal. 60 tablet 0  . Multiple Vitamin (MULTIVITAMIN) tablet Take 1 tablet by mouth daily.    . Vitamin D, Ergocalciferol, (DRISDOL) 50000 units CAPS capsule Take 1 capsule (50,000 Units total) by mouth every 7 (seven) days. 4 capsule 0   No current facility-administered medications on file prior to visit.      PAST MEDICAL HISTORY: Past Medical History:  Diagnosis Date  . Acid reflux   . Anemia   . Back pain   . Gallbladder problem   . High blood pressure   . Hip pain   . Pre-diabetes   . Shortness of breath   . Sleep apnea   . Swelling     PAST SURGICAL HISTORY: Past Surgical History:  Procedure Laterality Date  . CARPAL TUNNEL RELEASE    . GALLBLADDER SURGERY     April 2012  . LASIK     2000    SOCIAL HISTORY: Social History  Substance Use Topics  . Smoking status: Never Smoker  . Smokeless tobacco: Never Used  . Alcohol use Not on file    FAMILY HISTORY: Family History  Problem Relation Age of Onset  . Diabetes Mother   . Thyroid disease Mother   . Liver disease Mother   . Obesity Mother   . Hypertension Father   . Hyperlipidemia Father   . Heart disease Father   . Stroke Father   . Obesity Father     ROS: Review of Systems  Constitutional: Negative for weight loss.  Gastrointestinal: Positive for diarrhea and nausea.  Endo/Heme/Allergies:       Negative hypoglycemia    PHYSICAL EXAM: Blood pressure 114/66, pulse (!) 51, temperature 98.1 F (36.7 C), temperature source Oral,  height 5\' 6"  (1.676 m), weight 262 lb (118.8 kg), SpO2 98 %. Body mass index is 42.29 kg/m. Physical Exam  Constitutional: She is oriented to person, place, and time. She appears well-developed and well-nourished.  Cardiovascular:  bradycardic  Pulmonary/Chest: Effort normal.  Musculoskeletal: Normal range of motion.  Neurological: She is oriented to person, place, and time.  Skin: Skin is warm and dry.  Psychiatric: She has a normal mood and affect. Her behavior is normal.  Vitals reviewed.   RECENT LABS AND TESTS: BMET    Component Value Date/Time   NA 141 04/11/2017 0919   K 4.2 04/11/2017 0919   CL 97 04/11/2017 0919   CO2 26 04/11/2017 0919   GLUCOSE 92 04/11/2017 0919   GLUCOSE 94 12/28/2010 1530   BUN 21 04/11/2017 0919   CREATININE 1.16 (H) 04/11/2017  0919   CALCIUM 10.3 (H) 04/11/2017 0919   GFRNONAA 54 (L) 04/11/2017 0919   GFRAA 63 04/11/2017 0919   Lab Results  Component Value Date   HGBA1C 5.5 04/11/2017   HGBA1C 5.6 12/25/2016   HGBA1C 5.5 09/27/2016   Lab Results  Component Value Date   INSULIN 20.4 04/11/2017   INSULIN 16.9 12/25/2016   INSULIN 20.0 09/27/2016   CBC    Component Value Date/Time   WBC 8.3 09/27/2016 1342   WBC 9.1 12/28/2010 1530   RBC 4.93 09/27/2016 1342   RBC 4.66 12/28/2010 1530   HGB 13.4 09/27/2016 1342   HCT 40.9 09/27/2016 1342   PLT 193 12/28/2010 1530   MCV 83 09/27/2016 1342   MCH 27.2 09/27/2016 1342   MCH 24.9 (L) 12/28/2010 1530   MCHC 32.8 09/27/2016 1342   MCHC 31.8 12/28/2010 1530   RDW 15.9 (H) 09/27/2016 1342   LYMPHSABS 2.3 09/27/2016 1342   MONOABS 1.1 (H) 11/10/2010 0554   EOSABS 0.6 (H) 09/27/2016 1342   BASOSABS 0.0 09/27/2016 1342   Iron/TIBC/Ferritin/ %Sat No results found for: IRON, TIBC, FERRITIN, IRONPCTSAT Lipid Panel     Component Value Date/Time   CHOL 217 (H) 04/11/2017 0919   TRIG 173 (H) 04/11/2017 0919   HDL 46 04/11/2017 0919   LDLCALC 136 (H) 04/11/2017 0919   Hepatic Function Panel     Component Value Date/Time   PROT 7.3 04/11/2017 0919   ALBUMIN 4.4 04/11/2017 0919   AST 21 04/11/2017 0919   ALT 20 04/11/2017 0919   ALKPHOS 68 04/11/2017 0919   BILITOT 0.3 04/11/2017 0919   BILIDIR 0.1 11/09/2010 1645   IBILI 0.4 11/09/2010 1645      Component Value Date/Time   TSH 1.890 09/27/2016 1342    ASSESSMENT AND PLAN: Prediabetes  Class 3 obesity with serious comorbidity and body mass index (BMI) of 40.0 to 44.9 in adult, unspecified obesity type (Leslie Wright)  PLAN:  Pre-Diabetes Leslie Wright will continue to work on weight loss, exercise, and decreasing simple carbohydrates in her diet to help decrease the risk of diabetes. We dicussed metformin including benefits and risks. She was informed that eating too many simple carbohydrates or too many  calories at one sitting increases the likelihood of GI side effects. Leslie Wright agrees to continue metformin 500 mg once daily for now and a prescription was not written today. Leslie Wright agreed to follow up with [place] as directed to monitor her progress.  We spent > than 50% of the 15 minute visit on the counseling as documented in the note.  Obesity Leslie Wright is currently in the action stage of change. As such, her  goal is to continue with weight loss efforts She has agreed to follow the Category 2 plan +100 calories Leslie Wright has been instructed to work up to a goal of 150 minutes of combined cardio and strengthening exercise per week for weight loss and overall health benefits. We discussed the following Behavioral Modification Strategies today: increasing lean protein intake and meal planning & cooking strategies  Leslie Wright has agreed to follow up with our clinic in 3 weeks. She was informed of the importance of frequent follow up visits to maximize her success with intensive lifestyle modifications for her multiple health conditions.  I, Doreene Nest, am acting as transcriptionist for Lacy Duverney, PA-C  I have reviewed the above documentation for accuracy and completeness, and I agree with the above. -Lacy Duverney, PA-C    OBESITY BEHAVIORAL INTERVENTION VISIT  Today's visit was # 16 out of 22.  Starting weight: 287 lbs Starting date: 09/27/16 Today's weight : 262 lbs Today's date: 05/21/2017 Total lbs lost to date: 25 (Patients must lose 7 lbs in the first 6 months to continue with counseling)   ASK: We discussed the diagnosis of obesity with Leslie Wright today and Leslie Wright agreed to give [place] permission to discuss obesity behavioral modification therapy today.  ASSESS: Leslie Wright has the diagnosis of obesity and her BMI today is 42.31 Leslie Wright is in the action stage of change   ADVISE: Leslie Wright was educated on the multiple health risks of obesity as well as the benefit of weight loss to improve her  health. She was advised of the need for long term treatment and the importance of lifestyle modifications.  AGREE: Multiple dietary modification options and treatment options were discussed and  Leslie Wright agreed to follow the Category 2 plan +100 calories We discussed the following Behavioral Modification Strategies today: increasing lean protein intake and meal planning & cooking strategies

## 2017-05-29 ENCOUNTER — Ambulatory Visit (INDEPENDENT_AMBULATORY_CARE_PROVIDER_SITE_OTHER): Payer: 59 | Admitting: Orthopaedic Surgery

## 2017-06-11 ENCOUNTER — Ambulatory Visit (INDEPENDENT_AMBULATORY_CARE_PROVIDER_SITE_OTHER): Payer: 59 | Admitting: Physician Assistant

## 2017-06-11 VITALS — BP 104/71 | HR 58 | Temp 97.9°F | Ht 66.0 in | Wt 254.0 lb

## 2017-06-11 DIAGNOSIS — I10 Essential (primary) hypertension: Secondary | ICD-10-CM | POA: Diagnosis not present

## 2017-06-11 DIAGNOSIS — IMO0001 Reserved for inherently not codable concepts without codable children: Secondary | ICD-10-CM

## 2017-06-11 DIAGNOSIS — E669 Obesity, unspecified: Secondary | ICD-10-CM

## 2017-06-11 DIAGNOSIS — Z9189 Other specified personal risk factors, not elsewhere classified: Secondary | ICD-10-CM | POA: Diagnosis not present

## 2017-06-11 DIAGNOSIS — E559 Vitamin D deficiency, unspecified: Secondary | ICD-10-CM

## 2017-06-11 DIAGNOSIS — E7849 Other hyperlipidemia: Secondary | ICD-10-CM

## 2017-06-11 DIAGNOSIS — Z6841 Body Mass Index (BMI) 40.0 and over, adult: Secondary | ICD-10-CM

## 2017-06-11 DIAGNOSIS — E784 Other hyperlipidemia: Secondary | ICD-10-CM

## 2017-06-11 MED ORDER — LIRAGLUTIDE -WEIGHT MANAGEMENT 18 MG/3ML ~~LOC~~ SOPN: 3.0000 mg | PEN_INJECTOR | Freq: Every day | SUBCUTANEOUS | 0 refills | Status: DC

## 2017-06-11 MED ORDER — ATORVASTATIN CALCIUM 10 MG PO TABS: 10.0000 mg | ORAL_TABLET | Freq: Every day | ORAL | 0 refills | Status: DC

## 2017-06-11 MED ORDER — HYDROCHLOROTHIAZIDE 12.5 MG PO TABS: 12.5000 mg | ORAL_TABLET | Freq: Every day | ORAL | 0 refills | Status: DC

## 2017-06-11 MED ORDER — VITAMIN D (ERGOCALCIFEROL) 1.25 MG (50000 UNIT) PO CAPS: 50000.0000 [IU] | ORAL_CAPSULE | ORAL | 0 refills | Status: DC

## 2017-06-11 MED FILL — VIT D2 1.25 MG (50,000 UNIT: 1.25 MG | 28 days supply | Qty: 4 | Fill #0

## 2017-06-11 MED FILL — ATORVASTATIN 10 MG TABLET: 10 | 30 days supply | Qty: 30 | Fill #0

## 2017-06-11 MED FILL — HYDROCHLOROTHIAZIDE 12.5 MG: 12.5 | 30 days supply | Qty: 30 | Fill #0

## 2017-06-11 NOTE — Progress Notes (Signed)
Office: [phone]  /  Fax: [phone]   HPI:   Chief Complaint: OBESITY Leslie Wright is here to discuss her progress with her obesity treatment plan. She is on the Category 2 plan and is following her eating plan approximately 75 % of the time. She states she is exercising 0 minutes 0 times per week. Leslie Wright continues to do well with weight loss and has planned her meals well despite being busy with work.  Her weight is 254 lb (115.2 kg) today and has had a weight loss of 8 pounds over a period of 3 weeks since her last visit. She has lost 33 lbs since starting treatment with Leslie Wright.  Vitamin D deficiency Leslie Wright has a diagnosis of vitamin D deficiency. She is currently taking prescription Vit D and denies nausea, vomiting or muscle weakness.  Hyperlipidemia Leslie Wright has hyperlipidemia and has been trying to improve her cholesterol levels with intensive lifestyle modification including a low saturated fat diet, exercise and weight loss. She denies any chest pain, claudication or myalgias.  Hypertension Leslie Wright is a 51 y.o. female with hypertension. Her blood pressure is low and she has been feeling increased fatigue but she denies chest pain or dyspnea. She is working weight loss to help control her blood pressure with the goal of decreasing her risk of heart attack and stroke. Leslie Wright's blood pressure is not currently controlled.  At risk for cardiovascular disease Leslie Wright is at a higher than average risk for cardiovascular disease due to obesity, hyperlipidemia, and hypertension. She currently denies any chest pain.  ALLERGIES: Not on File  MEDICATIONS: Current Outpatient Prescriptions on File Prior to Visit  Medication Sig Dispense Refill  . aspirin 81 MG tablet Take 1 tablet (81 mg total) by mouth daily. 30 tablet 0  . atorvastatin (LIPITOR) 10 MG tablet Take 1 tablet (10 mg total) by mouth daily. 30 tablet 0  . hydrochlorothiazide (HYDRODIURIL) 12.5 MG tablet Take 1 tablet (12.5 mg  total) by mouth daily. 30 tablet 0  . ibuprofen (ADVIL,MOTRIN) 200 MG tablet Take 200 mg by mouth 4 (four) times daily.    . metFORMIN (GLUCOPHAGE) 500 MG tablet Take 1 tablet (500 mg total) by mouth 2 (two) times daily with a meal. 60 tablet 0  . Multiple Vitamin (MULTIVITAMIN) tablet Take 1 tablet by mouth daily.    . Vitamin D, Ergocalciferol, (DRISDOL) 50000 units CAPS capsule Take 1 capsule (50,000 Units total) by mouth every 7 (seven) days. 4 capsule 0   No current facility-administered medications on file prior to visit.     PAST MEDICAL HISTORY: Past Medical History:  Diagnosis Date  . Acid reflux   . Anemia   . Back pain   . Gallbladder problem   . High blood pressure   . Hip pain   . Pre-diabetes   . Shortness of breath   . Sleep apnea   . Swelling     PAST SURGICAL HISTORY: Past Surgical History:  Procedure Laterality Date  . CARPAL TUNNEL RELEASE    . GALLBLADDER SURGERY     April 2012  . LASIK     2000    SOCIAL HISTORY: Social History  Substance Use Topics  . Smoking status: Never Smoker  . Smokeless tobacco: Never Used  . Alcohol use Not on file    FAMILY HISTORY: Family History  Problem Relation Age of Onset  . Diabetes Mother   . Thyroid disease Mother   . Liver disease Mother   . Obesity  Mother   . Hypertension Father   . Hyperlipidemia Father   . Heart disease Father   . Stroke Father   . Obesity Father     ROS: Review of Systems  Constitutional: Positive for malaise/fatigue and weight loss.  Respiratory: Negative for shortness of breath.   Cardiovascular: Negative for chest pain and claudication.  Gastrointestinal: Negative for nausea and vomiting.  Musculoskeletal: Negative for myalgias.       Negative muscle weakness    PHYSICAL EXAM: Blood pressure 104/71, pulse (!) 58, temperature 97.9 F (36.6 C), temperature source Oral, height 5\' 6"  (1.676 m), weight 254 lb (115.2 kg), last menstrual period 05/28/2017, SpO2 98 %. Body  mass index is 41 kg/m. Physical Exam  Constitutional: She is oriented to person, place, and time. She appears well-developed and well-nourished.  Cardiovascular:  Bradycardic  Pulmonary/Chest: Effort normal.  Musculoskeletal: Normal range of motion.  Neurological: She is oriented to person, place, and time.  Skin: Skin is warm and dry.  Psychiatric: She has a normal mood and affect. Her behavior is normal.  Vitals reviewed.   RECENT LABS AND TESTS: BMET    Component Value Date/Time   NA 141 04/11/2017 0919   K 4.2 04/11/2017 0919   CL 97 04/11/2017 0919   CO2 26 04/11/2017 0919   GLUCOSE 92 04/11/2017 0919   GLUCOSE 94 12/28/2010 1530   BUN 21 04/11/2017 0919   CREATININE 1.16 (H) 04/11/2017 0919   CALCIUM 10.3 (H) 04/11/2017 0919   GFRNONAA 54 (L) 04/11/2017 0919   GFRAA 63 04/11/2017 0919   Lab Results  Component Value Date   HGBA1C 5.5 04/11/2017   HGBA1C 5.6 12/25/2016   HGBA1C 5.5 09/27/2016   Lab Results  Component Value Date   INSULIN 20.4 04/11/2017   INSULIN 16.9 12/25/2016   INSULIN 20.0 09/27/2016   CBC    Component Value Date/Time   WBC 8.3 09/27/2016 1342   WBC 9.1 12/28/2010 1530   RBC 4.93 09/27/2016 1342   RBC 4.66 12/28/2010 1530   HGB 13.4 09/27/2016 1342   HCT 40.9 09/27/2016 1342   PLT 193 12/28/2010 1530   MCV 83 09/27/2016 1342   MCH 27.2 09/27/2016 1342   MCH 24.9 (L) 12/28/2010 1530   MCHC 32.8 09/27/2016 1342   MCHC 31.8 12/28/2010 1530   RDW 15.9 (H) 09/27/2016 1342   LYMPHSABS 2.3 09/27/2016 1342   MONOABS 1.1 (H) 11/10/2010 0554   EOSABS 0.6 (H) 09/27/2016 1342   BASOSABS 0.0 09/27/2016 1342   Iron/TIBC/Ferritin/ %Sat No results found for: IRON, TIBC, FERRITIN, IRONPCTSAT Lipid Panel     Component Value Date/Time   CHOL 217 (H) 04/11/2017 0919   TRIG 173 (H) 04/11/2017 0919   HDL 46 04/11/2017 0919   LDLCALC 136 (H) 04/11/2017 0919   Hepatic Function Panel     Component Value Date/Time   PROT 7.3 04/11/2017 0919     ALBUMIN 4.4 04/11/2017 0919   AST 21 04/11/2017 0919   ALT 20 04/11/2017 0919   ALKPHOS 68 04/11/2017 0919   BILITOT 0.3 04/11/2017 0919   BILIDIR 0.1 11/09/2010 1645   IBILI 0.4 11/09/2010 1645      Component Value Date/Time   TSH 1.890 09/27/2016 1342    ASSESSMENT AND PLAN: Vitamin D deficiency - Plan: Vitamin D, Ergocalciferol, (DRISDOL) 50000 units CAPS capsule  Other hyperlipidemia - Plan: atorvastatin (LIPITOR) 10 MG tablet  Essential hypertension - Plan: hydrochlorothiazide (HYDRODIURIL) 12.5 MG tablet  At risk for heart disease  Class 3 obesity with serious comorbidity and body mass index (BMI) of 40.0 to 44.9 in adult, unspecified obesity type (Datil)  PLAN:  Vitamin D Deficiency Leslie Wright was informed that low vitamin D levels contributes to fatigue and are associated with obesity, breast, and colon cancer. She agrees to continue taking prescription Vit D @50 ,000 IU every week #4 and we will refill for 1 month. She will follow up for routine testing of vitamin D, at least 2-3 times per year. She was informed of the risk of over-replacement of vitamin D and agrees to not increase her dose unless he discusses this with Leslie Wright first. Leslie Wright agrees to follow up with our clinic in 2 weeks.  Hyperlipidemia Leslie Wright was informed of the American Heart Association Guidelines emphasizing intensive lifestyle modifications as the first line treatment for hyperlipidemia. We discussed many lifestyle modifications today in depth, and Leslie Wright will continue to work on decreasing saturated fats such as fatty red meat, butter and many fried foods. She will also increase vegetables and lean protein in her diet and continue to work on exercise and weight loss efforts. Leslie Wright agrees to continue taking Lipitor 10 mg qd #30 and we will refill for 1 month, and she will follow up with our clinic in 2 weeks.  Hypertension We discussed sodium restriction, working on healthy weight loss, and a regular exercise  program as the means to achieve improved blood pressure control. Leslie Wright agreed with this plan and agreed to follow up as directed. We will continue to monitor her blood pressure as well as her progress with the above lifestyle modifications. Leslie Wright agrees to continue hydrochlorothiazide 12.5 mg qd #30 and we will refill for 1 month (she is to take half a pill and keep a blood pressure log, to assess need to stop medication). She will watch for signs of hypotension as she continues her lifestyle modifications. Leslie Wright agrees to follow up with our clinic in 2 weeks.  Cardiovascular risk counselling Leslie Wright was given extended (15 minutes) coronary artery disease prevention counseling today. She is 51 y.o. female and has risk factors for heart disease including obesity, hyperlipidemia, and hypertension. We discussed intensive lifestyle modifications today with an emphasis on specific weight loss instructions and strategies. Pt was also informed of the importance of increasing exercise and decreasing saturated fats to help prevent heart disease.  Obesity Leslie Wright is currently in the action stage of change. As such, her goal is to continue with weight loss efforts She has agreed to follow the Category 2 plan Leslie Wright has been instructed to work up to a goal of 150 minutes of combined cardio and strengthening exercise per week for weight loss and overall health benefits. We discussed the following Behavioral Modification Strategies today: increasing lean protein intake and work on meal planning and easy cooking plans We discussed various medication options to help Leslie Wright with her weight loss efforts and we both agreed to start taking Saxenda (5 pens) (she is to start at 0.3 mg). Leslie Wright stated she was on Phentermine Phentermine approximately 5 years ago.  Leslie Wright has agreed to follow up with our clinic in 2 weeks. She was informed of the importance of frequent follow up visits to maximize her success with intensive  lifestyle modifications for her multiple health conditions.  I, Leslie Wright, am acting as transcriptionist for Lacy Duverney, PA-C  I have reviewed the above documentation for accuracy and completeness, and I agree with the above. -Lacy Duverney, PA-C  I have reviewed the above note and  agree with the plan. -Dennard Nip, MD   OBESITY BEHAVIORAL INTERVENTION VISIT  Today's visit was # 17 out of 38.  Starting weight: 287 lbs Starting date: 09/27/16 Today's weight: 254 lbs  Today's date: 06/11/17 Total lbs lost to date: 39 (Patients must lose 7 lbs in the first 6 months to continue with counseling)   ASK: We discussed the diagnosis of obesity with Ladona Ridgel today and Areyana agreed to give Leslie Wright permission to discuss obesity behavioral modification therapy today.  ASSESS: Delana has the diagnosis of obesity and her BMI today is O'Brien is in the action stage of change   ADVISE: Camrie was educated on the multiple health risks of obesity as well as the benefit of weight loss to improve her health. She was advised of the need for long term treatment and the importance of lifestyle modifications.  AGREE: Multiple dietary modification options and treatment options were discussed and  Irmalee agreed to follow the Category 2 plan We discussed the following Behavioral Modification Strategies today: increasing lean protein intake and work on meal planning and easy cooking plans

## 2017-06-24 ENCOUNTER — Ambulatory Visit (INDEPENDENT_AMBULATORY_CARE_PROVIDER_SITE_OTHER): Payer: 59 | Admitting: Physician Assistant

## 2017-06-24 VITALS — BP 108/74 | HR 68 | Temp 97.7°F | Ht 66.0 in | Wt 252.0 lb

## 2017-06-24 DIAGNOSIS — E559 Vitamin D deficiency, unspecified: Secondary | ICD-10-CM | POA: Diagnosis not present

## 2017-06-24 DIAGNOSIS — E669 Obesity, unspecified: Secondary | ICD-10-CM

## 2017-06-24 DIAGNOSIS — IMO0001 Reserved for inherently not codable concepts without codable children: Secondary | ICD-10-CM

## 2017-06-24 DIAGNOSIS — Z6841 Body Mass Index (BMI) 40.0 and over, adult: Secondary | ICD-10-CM | POA: Diagnosis not present

## 2017-06-24 NOTE — Progress Notes (Signed)
Office: [phone]  /  Fax: [phone]   HPI:   Chief Complaint: OBESITY Leslie Wright is here to discuss her progress with her obesity treatment plan. She is on the Category 2 plan and is following her eating plan approximately 65 % of the time. She states she is exercising 0 minutes 0 times per week. Leslie Wright continues to do well with weight loss. She has been making smarter food choices and she controls her portions. Her weight is 252 lb (114.3 kg) today and has had a weight loss of 2 pounds over a period of 2 weeks since her last visit. She has lost 35 lbs since starting treatment with [place].  Vitamin D deficiency Leslie Wright has a diagnosis of vitamin D deficiency. She is currently taking vit D and denies nausea, vomiting or muscle weakness.   ALLERGIES: Not on File  MEDICATIONS: Current Outpatient Prescriptions on File Prior to Visit  Medication Sig Dispense Refill  . acetaminophen (TYLENOL) 650 MG CR tablet Take 650 mg by mouth every 8 (eight) hours as needed for pain.    Marland Kitchen aspirin 81 MG tablet Take 1 tablet (81 mg total) by mouth daily. 30 tablet 0  . atorvastatin (LIPITOR) 10 MG tablet Take 1 tablet (10 mg total) by mouth daily. 30 tablet 0  . hydrochlorothiazide (HYDRODIURIL) 12.5 MG tablet Take 1 tablet (12.5 mg total) by mouth daily. 30 tablet 0  . ibuprofen (ADVIL,MOTRIN) 200 MG tablet Take 200 mg by mouth 4 (four) times daily.    . metFORMIN (GLUCOPHAGE) 500 MG tablet Take 1 tablet (500 mg total) by mouth 2 (two) times daily with a meal. (Patient taking differently: Take 500 mg by mouth 1 day or 1 dose. ) 60 tablet 0  . Multiple Vitamin (MULTIVITAMIN) tablet Take 1 tablet by mouth daily.    . Vitamin D, Ergocalciferol, (DRISDOL) 50000 units CAPS capsule Take 1 capsule (50,000 Units total) by mouth every 7 (seven) days. 4 capsule 0  . Liraglutide -Weight Management (SAXENDA) 18 MG/3ML SOPN Inject 3 mg into the skin daily. (Patient not taking: Reported on 06/24/2017) 5 pen 0   No  current facility-administered medications on file prior to visit.     PAST MEDICAL HISTORY: Past Medical History:  Diagnosis Date  . Acid reflux   . Anemia   . Back pain   . Gallbladder problem   . High blood pressure   . Hip pain   . Pre-diabetes   . Shortness of breath   . Sleep apnea   . Swelling     PAST SURGICAL HISTORY: Past Surgical History:  Procedure Laterality Date  . CARPAL TUNNEL RELEASE    . GALLBLADDER SURGERY     April 2012  . LASIK     2000    SOCIAL HISTORY: Social History  Substance Use Topics  . Smoking status: Never Smoker  . Smokeless tobacco: Never Used  . Alcohol use Not on file    FAMILY HISTORY: Family History  Problem Relation Age of Onset  . Diabetes Mother   . Thyroid disease Mother   . Liver disease Mother   . Obesity Mother   . Hypertension Father   . Hyperlipidemia Father   . Heart disease Father   . Stroke Father   . Obesity Father     ROS: Review of Systems  Constitutional: Positive for weight loss.  Gastrointestinal: Negative for nausea and vomiting.  Musculoskeletal:       Negative muscle weakness    PHYSICAL EXAM:  Blood pressure 108/74, pulse 68, temperature 97.7 F (36.5 C), temperature source Oral, height 5\' 6"  (1.676 m), weight 252 lb (114.3 kg), last menstrual period 05/28/2017, SpO2 98 %. Body mass index is 40.67 kg/m. Physical Exam  Constitutional: She is oriented to person, place, and time. She appears well-developed and well-nourished.  Cardiovascular: Normal rate.   Pulmonary/Chest: Effort normal.  Musculoskeletal: Normal range of motion.  Neurological: She is oriented to person, place, and time.  Skin: Skin is warm and dry.  Psychiatric: She has a normal mood and affect. Her behavior is normal.  Vitals reviewed.   RECENT LABS AND TESTS: BMET    Component Value Date/Time   NA 141 04/11/2017 0919   K 4.2 04/11/2017 0919   CL 97 04/11/2017 0919   CO2 26 04/11/2017 0919   GLUCOSE 92 04/11/2017  0919   GLUCOSE 94 12/28/2010 1530   BUN 21 04/11/2017 0919   CREATININE 1.16 (H) 04/11/2017 0919   CALCIUM 10.3 (H) 04/11/2017 0919   GFRNONAA 54 (L) 04/11/2017 0919   GFRAA 63 04/11/2017 0919   Lab Results  Component Value Date   HGBA1C 5.5 04/11/2017   HGBA1C 5.6 12/25/2016   HGBA1C 5.5 09/27/2016   Lab Results  Component Value Date   INSULIN 20.4 04/11/2017   INSULIN 16.9 12/25/2016   INSULIN 20.0 09/27/2016   CBC    Component Value Date/Time   WBC 8.3 09/27/2016 1342   WBC 9.1 12/28/2010 1530   RBC 4.93 09/27/2016 1342   RBC 4.66 12/28/2010 1530   HGB 13.4 09/27/2016 1342   HCT 40.9 09/27/2016 1342   PLT 193 12/28/2010 1530   MCV 83 09/27/2016 1342   MCH 27.2 09/27/2016 1342   MCH 24.9 (L) 12/28/2010 1530   MCHC 32.8 09/27/2016 1342   MCHC 31.8 12/28/2010 1530   RDW 15.9 (H) 09/27/2016 1342   LYMPHSABS 2.3 09/27/2016 1342   MONOABS 1.1 (H) 11/10/2010 0554   EOSABS 0.6 (H) 09/27/2016 1342   BASOSABS 0.0 09/27/2016 1342   Iron/TIBC/Ferritin/ %Sat No results found for: IRON, TIBC, FERRITIN, IRONPCTSAT Lipid Panel     Component Value Date/Time   CHOL 217 (H) 04/11/2017 0919   TRIG 173 (H) 04/11/2017 0919   HDL 46 04/11/2017 0919   LDLCALC 136 (H) 04/11/2017 0919   Hepatic Function Panel     Component Value Date/Time   PROT 7.3 04/11/2017 0919   ALBUMIN 4.4 04/11/2017 0919   AST 21 04/11/2017 0919   ALT 20 04/11/2017 0919   ALKPHOS 68 04/11/2017 0919   BILITOT 0.3 04/11/2017 0919   BILIDIR 0.1 11/09/2010 1645   IBILI 0.4 11/09/2010 1645      Component Value Date/Time   TSH 1.890 09/27/2016 1342    ASSESSMENT AND PLAN: Vitamin D deficiency  Class 3 obesity with serious comorbidity and body mass index (BMI) of 40.0 to 44.9 in adult, unspecified obesity type (Leslie Wright)  PLAN:  Vitamin D Deficiency Leslie Wright was informed that low vitamin D levels contributes to fatigue and are associated with obesity, breast, and colon cancer. She agrees to continue to  take prescription Vit D @50 ,000 IU every week and will follow up for routine testing of vitamin D, at least 2-3 times per year. She was informed of the risk of over-replacement of vitamin D and agrees to not increase her dose unless he discusses this with [place] first.  We spent > than 50% of the 15 minute visit on the counseling as documented in the note.  Obesity  Leslie Wright is currently in the action stage of change. As such, her goal is to continue with weight loss efforts She has agreed to keep a food journal with 1300 to 1500 calories and 80+ grams of protein daily Leslie Wright has been instructed to work up to a goal of 150 minutes of combined cardio and strengthening exercise per week for weight loss and overall health benefits. We discussed the following Behavioral Modification Strategies today: increasing lean protein intake and work on meal planning and easy cooking plans  Leslie Wright has agreed to follow up with our clinic in 3 weeks. She was informed of the importance of frequent follow up visits to maximize her success with intensive lifestyle modifications for her multiple health conditions.  I, Doreene Nest, am acting as transcriptionist for Lacy Duverney, PA-C  I have reviewed the above documentation for accuracy and completeness, and I agree with the above. -Lacy Duverney, PA-C  I have reviewed the above note and agree with the plan. -Dennard Nip, MD   OBESITY BEHAVIORAL INTERVENTION VISIT  Today's visit was # 18 out of 22.  Starting weight: 287 lbs Starting date: 09/27/16 Today's weight : 252 lbs  Today's date: 06/24/2017 Total lbs lost to date: 97 (Patients must lose 7 lbs in the first 6 months to continue with counseling)   ASK: We discussed the diagnosis of obesity with Ladona Ridgel today and Bryanah agreed to give [place] permission to discuss obesity behavioral modification therapy today.  ASSESS: Kalan has the diagnosis of obesity and her BMI today is 40.69 Cambrie is in the  action stage of change   ADVISE: Samamtha was educated on the multiple health risks of obesity as well as the benefit of weight loss to improve her health. She was advised of the need for long term treatment and the importance of lifestyle modifications.  AGREE: Multiple dietary modification options and treatment options were discussed and  Elenna agreed to keep a food journal with 1300 to 1500 calories and 80+ grams of protein  We discussed the following Behavioral Modification Strategies today: increasing lean protein intake and work on meal planning and easy cooking plans

## 2017-07-01 ENCOUNTER — Encounter (INDEPENDENT_AMBULATORY_CARE_PROVIDER_SITE_OTHER): Payer: Self-pay

## 2017-07-05 ENCOUNTER — Encounter (INDEPENDENT_AMBULATORY_CARE_PROVIDER_SITE_OTHER): Payer: Self-pay | Admitting: Physician Assistant

## 2017-07-15 ENCOUNTER — Ambulatory Visit (INDEPENDENT_AMBULATORY_CARE_PROVIDER_SITE_OTHER): Payer: 59 | Admitting: Physician Assistant

## 2017-07-15 VITALS — BP 125/83 | HR 56 | Temp 97.8°F | Ht 66.0 in | Wt 256.0 lb

## 2017-07-15 DIAGNOSIS — E782 Mixed hyperlipidemia: Secondary | ICD-10-CM

## 2017-07-15 DIAGNOSIS — Z9189 Other specified personal risk factors, not elsewhere classified: Secondary | ICD-10-CM

## 2017-07-15 DIAGNOSIS — E559 Vitamin D deficiency, unspecified: Secondary | ICD-10-CM | POA: Diagnosis not present

## 2017-07-15 DIAGNOSIS — R7303 Prediabetes: Secondary | ICD-10-CM

## 2017-07-15 DIAGNOSIS — Z6841 Body Mass Index (BMI) 40.0 and over, adult: Secondary | ICD-10-CM

## 2017-07-15 DIAGNOSIS — I10 Essential (primary) hypertension: Secondary | ICD-10-CM

## 2017-07-15 MED ORDER — ATORVASTATIN CALCIUM 10 MG PO TABS: 10.0000 mg | ORAL_TABLET | Freq: Every day | ORAL | 0 refills | Status: DC

## 2017-07-15 MED ORDER — METFORMIN HCL 500 MG PO TABS: 500.0000 mg | ORAL_TABLET | Freq: Two times a day (BID) | ORAL | 0 refills | Status: DC

## 2017-07-15 MED ORDER — HYDROCHLOROTHIAZIDE 12.5 MG PO TABS: 12.5000 mg | ORAL_TABLET | Freq: Every day | ORAL | 0 refills | Status: DC

## 2017-07-15 MED ORDER — VITAMIN D (ERGOCALCIFEROL) 1.25 MG (50000 UNIT) PO CAPS
50000.0000 [IU] | ORAL_CAPSULE | ORAL | 0 refills | Status: DC
Start: 2017-07-15 — End: 2017-08-19

## 2017-07-15 MED FILL — ATORVASTATIN 10 MG TABLET: 10 | 30 days supply | Qty: 30 | Fill #0

## 2017-07-15 MED FILL — metFORMIN HCL 500 MG TABS: 500 | 30 days supply | Qty: 60 | Fill #0

## 2017-07-15 MED FILL — HYDROCHLOROTHIAZIDE 12.5 MG: 12.5 | 30 days supply | Qty: 30 | Fill #0

## 2017-07-15 MED FILL — VIT D2 1.25 MG (50,000 UNIT: 1.25 MG | 28 days supply | Qty: 4 | Fill #0

## 2017-07-15 NOTE — Progress Notes (Signed)
Office: [phone]  /  Fax: [phone]   HPI:   Chief Complaint: OBESITY Leslie Wright is here to discuss her progress with her obesity treatment plan. She is on the keep a food journal with 1300-1500 calories and 80+ grams of protein daily and is following her eating plan approximately 60 % of the time. She states she is exercising 0 minutes 0 times per week. 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 She states that she would like to go back to category meal plan. She has not started saxenda yet .  Her weight is 256 lb (116.1 kg) today and has gained 4 pounds  since her last visit. She has lost 31 lbs since starting treatment with [place].  Vitamin D deficiency [name] has a diagnosis of vitamin D deficiency. She is currently taking prescription Vit D and denies nausea, vomiting or muscle weakness.  Hyperlipidemia Leslie Wright has hyperlipidemia and has been trying to improve her cholesterol levels with intensive lifestyle modification including a low saturated fat diet, exercise and weight loss. She denies any chest pain, claudication or myalgias.  Hypertension [name] is a 51 y.o. female with hypertension.  [name] denies chest pain or shortness of breath. She is working weight loss to help control her blood pressure with the goal of decreasing her risk of heart attack and stroke. [name] blood pressure is currently controlled.  At risk for cardiovascular disease Leslie Wright is at a higher than average risk for cardiovascular disease due to obesity, hyperlipidemia, and hypertension. She currently denies any chest pain.  Pre-Diabetes Leslie Wright has a diagnosis of pre-diabetes based on her elevated Hgb A1c and was informed this puts her at greater risk of developing diabetes. She is taking metformin currently and continues to work on diet and exercise to decrease risk of diabetes. She denies nausea,  polyphagia, or hypoglycemia.  ALLERGIES: Not on File  MEDICATIONS: Current Outpatient Prescriptions on File Prior to Visit  Medication Sig Dispense Refill  . acetaminophen (TYLENOL) 650 MG CR tablet Take 650 mg by mouth every 8 (eight) hours as needed for pain.    Marland Kitchen aspirin 81 MG tablet Take 1 tablet (81 mg total) by mouth daily. 30 tablet 0  . ibuprofen (ADVIL,MOTRIN) 200 MG tablet Take 200 mg by mouth 4 (four) times daily.    . Liraglutide -Weight Management (SAXENDA) 18 MG/3ML SOPN Inject 3 mg into the skin daily. (Patient not taking: Reported on 06/24/2017) 5 pen 0  . Multiple Vitamin (MULTIVITAMIN) tablet Take 1 tablet by mouth daily.     No current facility-administered medications on file prior to visit.     PAST MEDICAL HISTORY: Past Medical History:  Diagnosis Date  . Acid reflux   . Anemia   . Back pain   . Gallbladder problem   . High blood pressure   . Hip pain   . Pre-diabetes   . Shortness of breath   . Sleep apnea   . Swelling     PAST SURGICAL HISTORY: Past Surgical History:  Procedure Laterality Date  . CARPAL TUNNEL RELEASE    . GALLBLADDER SURGERY     April 2012  . LASIK     2000    SOCIAL HISTORY: Social History  Substance Use Topics  . Smoking status: Never Smoker  . Smokeless tobacco: Never Used  . Alcohol use Not on file    FAMILY HISTORY:  Family History  Problem Relation Age of Onset  . Diabetes Mother   . Thyroid disease Mother   . Liver disease Mother   . Obesity Mother   . Hypertension Father   . Hyperlipidemia Father   . Heart disease Father   . Stroke Father   . Obesity Father     ROS: Review of Systems  Constitutional: Negative for weight loss.  Respiratory: Negative for shortness of breath.   Cardiovascular: Negative for chest pain and claudication.  Gastrointestinal: Negative for nausea and vomiting.  Musculoskeletal: Negative for myalgias.       Negative muscle weakness  Endo/Heme/Allergies:       Negative  polyphagia Negative hypoglycemia    PHYSICAL EXAM: Blood pressure 125/83, pulse (!) 56, temperature 97.8 F (36.6 C), height 5\' 6"  (1.676 m), weight 256 lb (116.1 kg), SpO2 98 %. Body mass index is 41.32 kg/m. Physical Exam  Constitutional: She is oriented to person, place, and time. She appears well-developed and well-nourished.  Cardiovascular: Normal rate.   Pulmonary/Chest: Effort normal.  Musculoskeletal: Normal range of motion.  Neurological: She is oriented to person, place, and time.  Skin: Skin is warm and dry.  Psychiatric: She has a normal mood and affect. Her behavior is normal.  Vitals reviewed.   RECENT LABS AND TESTS: BMET    Component Value Date/Time   NA 141 04/11/2017 0919   K 4.2 04/11/2017 0919   CL 97 04/11/2017 0919   CO2 26 04/11/2017 0919   GLUCOSE 92 04/11/2017 0919   GLUCOSE 94 12/28/2010 1530   BUN 21 04/11/2017 0919   CREATININE 1.16 (H) 04/11/2017 0919   CALCIUM 10.3 (H) 04/11/2017 0919   GFRNONAA 54 (L) 04/11/2017 0919   GFRAA 63 04/11/2017 0919   Lab Results  Component Value Date   HGBA1C 5.5 04/11/2017   HGBA1C 5.6 12/25/2016   HGBA1C 5.5 09/27/2016   Lab Results  Component Value Date   INSULIN 20.4 04/11/2017   INSULIN 16.9 12/25/2016   INSULIN 20.0 09/27/2016   CBC    Component Value Date/Time   WBC 8.3 09/27/2016 1342   WBC 9.1 12/28/2010 1530   RBC 4.93 09/27/2016 1342   RBC 4.66 12/28/2010 1530   HGB 13.4 09/27/2016 1342   HCT 40.9 09/27/2016 1342   PLT 193 12/28/2010 1530   MCV 83 09/27/2016 1342   MCH 27.2 09/27/2016 1342   MCH 24.9 (L) 12/28/2010 1530   MCHC 32.8 09/27/2016 1342   MCHC 31.8 12/28/2010 1530   RDW 15.9 (H) 09/27/2016 1342   LYMPHSABS 2.3 09/27/2016 1342   MONOABS 1.1 (H) 11/10/2010 0554   EOSABS 0.6 (H) 09/27/2016 1342   BASOSABS 0.0 09/27/2016 1342   Iron/TIBC/Ferritin/ %Sat No results found for: IRON, TIBC, FERRITIN, IRONPCTSAT Lipid Panel     Component Value Date/Time   CHOL 217 (H)  04/11/2017 0919   TRIG 173 (H) 04/11/2017 0919   HDL 46 04/11/2017 0919   LDLCALC 136 (H) 04/11/2017 0919   Hepatic Function Panel     Component Value Date/Time   PROT 7.3 04/11/2017 0919   ALBUMIN 4.4 04/11/2017 0919   AST 21 04/11/2017 0919   ALT 20 04/11/2017 0919   ALKPHOS 68 04/11/2017 0919   BILITOT 0.3 04/11/2017 0919   BILIDIR 0.1 11/09/2010 1645   IBILI 0.4 11/09/2010 1645      Component Value Date/Time   TSH 1.890 09/27/2016 1342    ASSESSMENT AND PLAN: Essential hypertension - Plan: hydrochlorothiazide (HYDRODIURIL) 12.5 MG tablet  Vitamin D deficiency - Plan: VITAMIN D 25 Hydroxy (Vit-D Deficiency, Fractures), Vitamin D, Ergocalciferol, (DRISDOL) 50000 units CAPS capsule  Mixed hyperlipidemia - Plan: Lipid Panel With LDL/HDL Ratio, atorvastatin (LIPITOR) 10 MG tablet  Prediabetes - Plan: Comprehensive metabolic panel, Hemoglobin A1c, Insulin, random, metFORMIN (GLUCOPHAGE) 500 MG tablet  At risk for heart disease  Class 3 severe obesity with serious comorbidity and body mass index (BMI) of 40.0 to 44.9 in adult, unspecified obesity type (HCC)  PLAN:  Vitamin D Deficiency Beda was informed that low vitamin D levels contributes to fatigue and are associated with obesity, breast, and colon cancer. She agrees to continue taking prescription Vit D @50 ,000 IU every week #4 and we will refill for 1 month and she will follow up for routine testing of vitamin D, at least 2-3 times per year. She was informed of the risk of over-replacement of vitamin D and agrees to not increase her dose unless he discusses this with [place] first. We will recheck labs and Kimyah agrees to follow up with our clinic in 3 weeks.  Hyperlipidemia Makynli was informed of the American Heart Association Guidelines emphasizing intensive lifestyle modifications as the first line treatment for hyperlipidemia. We discussed many lifestyle modifications today in depth, and Lynzee will continue to work on  decreasing saturated fats such as fatty red meat, butter and many fried foods. She will also increase vegetables and lean protein in her diet and continue to work on exercise and weight loss efforts. Kemia agrees to continue taking lipitor 10 mg #30 and we will refill for 1 month. She agrees to follow up with our clinic in 3 weeks.  Hypertension We discussed sodium restriction, working on healthy weight loss, and a regular exercise program as the means to achieve improved blood pressure control. Inza agreed with this plan and agreed to follow up as directed. We will continue to monitor her blood pressure as well as her progress with the above lifestyle modifications. Christien agrees to continue taking hydrochlorothiazide 12.5 mg #30 and we will refill for 1 month. She will watch for signs of hypotension as she continues her lifestyle modifications. She agrees to follow up with our clinic in 3 weeks.  Cardiovascular risk counselling Desia was given extended (15 minutes) coronary artery disease prevention counseling today. She is 51 y.o. female and has risk factors for heart disease including obesity, hyperlipidemia, and hypertension. We discussed intensive lifestyle modifications today with an emphasis on specific weight loss instructions and strategies. Pt was also informed of the importance of increasing exercise and decreasing saturated fats to help prevent heart disease.  Pre-Diabetes Jalisha will continue to work on weight loss, exercise, and decreasing simple carbohydrates in her diet to help decrease the risk of diabetes. We dicussed metformin including benefits and risks. She was informed that eating too many simple carbohydrates or too many calories at one sitting increases the likelihood of GI side effects. Loucile agrees to continue taking metformin 500 mg #60 BID and we will refill for 1 month. Makailey agrees to follow up with our clinic in 3 weeks as directed to monitor her  progress.  Obesity Carollyn is currently in the action stage of change. As such, her goal is to continue with weight loss efforts She has agreed to change to follow the Category 2 plan Malaijah has been instructed to work up to a goal of 150 minutes of combined cardio and strengthening exercise per week for weight loss and overall health benefits. We discussed the  following Behavioral Modification Strategies today: increasing lean protein intake and planning for success We discussed various medication options to help Kemora with her weight loss efforts and we both agreed to continue saxenda   Kellsey has agreed to follow up with our clinic in 3 weeks. She was informed of the importance of frequent follow up visits to maximize her success with intensive lifestyle modifications for her multiple health conditions.  I, Trixie Dredge, am acting as transcriptionist for Lacy Duverney, PA-C  I have reviewed the above documentation for accuracy and completeness, and I agree with the above. -Lacy Duverney, PA-C  I have reviewed the above note and agree with the plan. -Dennard Nip, MD      Today's visit was # 19 out of 22.  Starting weight: 287 lbs Starting date: 09/27/16 Today's weight: 256 lbs  Today's date: 07/15/2017 Total lbs lost to date: 20 (Patients must lose 7 lbs in the first 6 months to continue with counseling)   ASK: We discussed the diagnosis of obesity with Ladona Ridgel today and Daesia agreed to give [place] permission to discuss obesity behavioral modification therapy today.  ASSESS: Cybill has the diagnosis of obesity and her BMI today is [place] is in the action stage of change   ADVISE: Meganne was educated on the multiple health risks of obesity as well as the benefit of weight loss to improve her health. She was advised of the need for long term treatment and the importance of lifestyle modifications.  AGREE: Multiple dietary modification options and treatment options  were discussed and  Arpi agreed to follow the Category 2 plan We discussed the following Behavioral Modification Strategies today: increasing lean protein intake and planning for success

## 2017-07-16 LAB — COMPREHENSIVE METABOLIC PANEL
ALK PHOS: 72 IU/L (ref 39–117)
ALT: 15 IU/L (ref 0–32)
AST: 19 IU/L (ref 0–40)
Albumin/Globulin Ratio: 1.4 (ref 1.2–2.2)
Albumin: 4.2 g/dL (ref 3.5–5.5)
BILIRUBIN TOTAL: 0.2 mg/dL (ref 0.0–1.2)
BUN/Creatinine Ratio: 14 (ref 9–23)
BUN: 16 mg/dL (ref 6–24)
CHLORIDE: 101 mmol/L (ref 96–106)
CO2: 26 mmol/L (ref 20–29)
CREATININE: 1.14 mg/dL — AB (ref 0.57–1.00)
Calcium: 9.9 mg/dL (ref 8.7–10.2)
GFR calc non Af Amer: 55 mL/min/{1.73_m2} — ABNORMAL LOW (ref >59)
GFR, EST AFRICAN AMERICAN: 64 mL/min/{1.73_m2} (ref >59)
GLOBULIN, TOTAL: 2.9 g/dL (ref 1.5–4.5)
Glucose: 92 mg/dL (ref 65–99)
POTASSIUM: 4.8 mmol/L (ref 3.5–5.2)
SODIUM: 142 mmol/L (ref 134–144)
Total Protein: 7.1 g/dL (ref 6.0–8.5)

## 2017-07-16 LAB — LIPID PANEL WITH LDL/HDL RATIO
CHOLESTEROL TOTAL: 168 mg/dL (ref 100–199)
HDL: 45 mg/dL (ref >39)
LDL CALC: 89 mg/dL (ref 0–99)
LDl/HDL Ratio: 2 ratio (ref 0.0–3.2)
TRIGLYCERIDES: 168 mg/dL — AB (ref 0–149)
VLDL CHOLESTEROL CAL: 34 mg/dL (ref 5–40)

## 2017-07-16 LAB — VITAMIN D 25 HYDROXY (VIT D DEFICIENCY, FRACTURES): Vit D, 25-Hydroxy: 33.4 ng/mL (ref 30.0–100.0)

## 2017-07-16 LAB — HEMOGLOBIN A1C
Est. average glucose Bld gHb Est-mCnc: 114 mg/dL
Hgb A1c MFr Bld: 5.6 % (ref 4.8–5.6)

## 2017-07-16 LAB — INSULIN, RANDOM: INSULIN: 22.2 u[IU]/mL (ref 2.6–24.9)

## 2017-08-05 ENCOUNTER — Ambulatory Visit (INDEPENDENT_AMBULATORY_CARE_PROVIDER_SITE_OTHER): Payer: 59 | Admitting: Physician Assistant

## 2017-08-05 VITALS — BP 124/83 | HR 54 | Temp 98.5°F | Ht 66.0 in | Wt 258.0 lb

## 2017-08-05 DIAGNOSIS — E7849 Other hyperlipidemia: Secondary | ICD-10-CM

## 2017-08-05 DIAGNOSIS — Z6841 Body Mass Index (BMI) 40.0 and over, adult: Secondary | ICD-10-CM | POA: Diagnosis not present

## 2017-08-05 NOTE — Progress Notes (Signed)
Office: [phone]  /  Fax: [phone]   HPI:   Chief Complaint: OBESITY Leslie Wright is here to discuss her progress with her obesity treatment plan. She is on the Category 2 plan and is following her eating plan approximately 50 % of the time. She states she is exercising 0 minutes 0 times per week. Leslie Wright is busier with school and states she has not been getting her required daily protein. She would like more meal planning ideas and convenience food options. Her weight is 258 lb (117 kg) today and has had a weight gain of 2 pounds over a period of 3 weeks since her last visit. She has lost 29 lbs since starting treatment with [place].  Hyperlipidemia Leslie Wright has hyperlipidemia and is currently on lipitor. She has been trying to improve her cholesterol levels with intensive lifestyle modification including a low saturated fat diet, exercise and weight loss. Her triglycerides improved but are not yet at goal and LDL is less than 100. She denies any chest pain, claudication or myalgias.   ALLERGIES: Not on File  MEDICATIONS: Current Outpatient Medications on File Prior to Visit  Medication Sig Dispense Refill  . acetaminophen (TYLENOL) 650 MG CR tablet Take 650 mg by mouth every 8 (eight) hours as needed for pain.    Marland Kitchen aspirin 81 MG tablet Take 1 tablet (81 mg total) by mouth daily. 30 tablet 0  . atorvastatin (LIPITOR) 10 MG tablet Take 1 tablet (10 mg total) by mouth daily. 30 tablet 0  . hydrochlorothiazide (HYDRODIURIL) 12.5 MG tablet Take 1 tablet (12.5 mg total) by mouth daily. 30 tablet 0  . ibuprofen (ADVIL,MOTRIN) 200 MG tablet Take 200 mg by mouth 4 (four) times daily.    . Liraglutide -Weight Management (SAXENDA) 18 MG/3ML SOPN Inject 3 mg into the skin daily. 5 pen 0  . metFORMIN (GLUCOPHAGE) 500 MG tablet Take 1 tablet (500 mg total) by mouth 2 (two) times daily with a meal. 60 tablet 0  . Multiple Vitamin (MULTIVITAMIN) tablet Take 1 tablet by mouth daily.    . Vitamin D,  Ergocalciferol, (DRISDOL) 50000 units CAPS capsule Take 1 capsule (50,000 Units total) by mouth every 7 (seven) days. 4 capsule 0   No current facility-administered medications on file prior to visit.     PAST MEDICAL HISTORY: Past Medical History:  Diagnosis Date  . Acid reflux   . Anemia   . Back pain   . Gallbladder problem   . High blood pressure   . Hip pain   . Pre-diabetes   . Shortness of breath   . Sleep apnea   . Swelling     PAST SURGICAL HISTORY: Past Surgical History:  Procedure Laterality Date  . CARPAL TUNNEL RELEASE    . GALLBLADDER SURGERY     April 2012  . LASIK     2000    SOCIAL HISTORY: Social History   Tobacco Use  . Smoking status: Never Smoker  . Smokeless tobacco: Never Used  Substance Use Topics  . Alcohol use: Not on file  . Drug use: Not on file    FAMILY HISTORY: Family History  Problem Relation Age of Onset  . Diabetes Mother   . Thyroid disease Mother   . Liver disease Mother   . Obesity Mother   . Hypertension Father   . Hyperlipidemia Father   . Heart disease Father   . Stroke Father   . Obesity Father     ROS: Review of Systems  Constitutional: Negative for weight loss.  Cardiovascular: Negative for chest pain and claudication.  Musculoskeletal: Negative for myalgias.    PHYSICAL EXAM: Blood pressure 124/83, pulse (!) 54, temperature 98.5 F (36.9 C), temperature source Oral, height 5\' 6"  (1.676 m), weight 258 lb (117 kg), last menstrual period 06/11/2017, SpO2 98 %. Body mass index is 41.64 kg/m. Physical Exam  Constitutional: She is oriented to person, place, and time. She appears well-developed and well-nourished.  Cardiovascular:  Bradycardic  Pulmonary/Chest: Effort normal.  Musculoskeletal: Normal range of motion.  Neurological: She is oriented to person, place, and time.  Skin: Skin is warm and dry.  Psychiatric: She has a normal mood and affect. Her behavior is normal.  Vitals reviewed.   RECENT  LABS AND TESTS: BMET    Component Value Date/Time   NA 142 07/15/2017 1033   K 4.8 07/15/2017 1033   CL 101 07/15/2017 1033   CO2 26 07/15/2017 1033   GLUCOSE 92 07/15/2017 1033   GLUCOSE 94 12/28/2010 1530   BUN 16 07/15/2017 1033   CREATININE 1.14 (H) 07/15/2017 1033   CALCIUM 9.9 07/15/2017 1033   GFRNONAA 55 (L) 07/15/2017 1033   GFRAA 64 07/15/2017 1033   Lab Results  Component Value Date   HGBA1C 5.6 07/15/2017   HGBA1C 5.5 04/11/2017   HGBA1C 5.6 12/25/2016   HGBA1C 5.5 09/27/2016   Lab Results  Component Value Date   INSULIN 22.2 07/15/2017   INSULIN 20.4 04/11/2017   INSULIN 16.9 12/25/2016   INSULIN 20.0 09/27/2016   CBC    Component Value Date/Time   WBC 8.3 09/27/2016 1342   WBC 9.1 12/28/2010 1530   RBC 4.93 09/27/2016 1342   RBC 4.66 12/28/2010 1530   HGB 13.4 09/27/2016 1342   HCT 40.9 09/27/2016 1342   PLT 193 12/28/2010 1530   MCV 83 09/27/2016 1342   MCH 27.2 09/27/2016 1342   MCH 24.9 (L) 12/28/2010 1530   MCHC 32.8 09/27/2016 1342   MCHC 31.8 12/28/2010 1530   RDW 15.9 (H) 09/27/2016 1342   LYMPHSABS 2.3 09/27/2016 1342   MONOABS 1.1 (H) 11/10/2010 0554   EOSABS 0.6 (H) 09/27/2016 1342   BASOSABS 0.0 09/27/2016 1342   Iron/TIBC/Ferritin/ %Sat No results found for: IRON, TIBC, FERRITIN, IRONPCTSAT Lipid Panel     Component Value Date/Time   CHOL 168 07/15/2017 1033   TRIG 168 (H) 07/15/2017 1033   HDL 45 07/15/2017 1033   LDLCALC 89 07/15/2017 1033   Hepatic Function Panel     Component Value Date/Time   PROT 7.1 07/15/2017 1033   ALBUMIN 4.2 07/15/2017 1033   AST 19 07/15/2017 1033   ALT 15 07/15/2017 1033   ALKPHOS 72 07/15/2017 1033   BILITOT 0.2 07/15/2017 1033   BILIDIR 0.1 11/09/2010 1645   IBILI 0.4 11/09/2010 1645      Component Value Date/Time   TSH 1.890 09/27/2016 1342    ASSESSMENT AND PLAN: Other hyperlipidemia  Class 3 severe obesity with serious comorbidity and body mass index (BMI) of 40.0 to 44.9 in  adult, unspecified obesity type (Leslie Wright)  PLAN:  Hyperlipidemia Leslie Wright was informed of the American Heart Association Guidelines emphasizing intensive lifestyle modifications as the first line treatment for hyperlipidemia. We discussed many lifestyle modifications today in depth, and Leslie Wright will continue to work on decreasing saturated fats such as fatty red meat, butter and many fried foods. She will also increase vegetables and lean protein in her diet and continue to work on exercise and weight  loss efforts. Leslie Wright will continue lipitor as prescribed and will follow up with our clinic in 2 weeks.  We spent > than 50% of the 15 minute visit on the counseling as documented in the note.  Obesity Leslie Wright is currently in the action stage of change. As such, her goal is to continue with weight loss efforts She has agreed to keep a food journal with 1100 to 1300 calories and 90 grams of protein daily Leslie Wright has been instructed to work up to a goal of 150 minutes of combined cardio and strengthening exercise per week for weight loss and overall health benefits. We discussed the following Behavioral Modification Strategies today: increasing lean protein intake and work on meal planning and easy cooking plans  Orilla has agreed to follow up with our clinic in 2 weeks. She was informed of the importance of frequent follow up visits to maximize her success with intensive lifestyle modifications for her multiple health conditions.  I, Doreene Nest, am acting as transcriptionist for Lacy Duverney, PA-C  I have reviewed the above documentation for accuracy and completeness, and I agree with the above. -Lacy Duverney, PA-C  I have reviewed the above note and agree with the plan. -Dennard Nip, MD   OBESITY BEHAVIORAL INTERVENTION VISIT  Today's visit was # 20 out of 22.  Starting weight: 287 lbs Starting date: 09/27/16 Today's weight : 258 lbs  Today's date: 08/05/2017 Total lbs lost to date:  29 (Patients must lose 7 lbs in the first 6 months to continue with counseling)   ASK: We discussed the diagnosis of obesity with Ladona Ridgel today and Kaile agreed to give [place] permission to discuss obesity behavioral modification therapy today.  ASSESS: Charlisha has the diagnosis of obesity and her BMI today is 41.66 Dreyah is in the action stage of change   ADVISE: Maryna was educated on the multiple health risks of obesity as well as the benefit of weight loss to improve her health. She was advised of the need for long term treatment and the importance of lifestyle modifications.  AGREE: Multiple dietary modification options and treatment options were discussed and  Humaira agreed to keep a food journal with 1100 to 1300 calories and 90 grams of protein daily We discussed the following Behavioral Modification Strategies today: increasing lean protein intake and work on meal planning and easy cooking plans

## 2017-08-19 ENCOUNTER — Ambulatory Visit (INDEPENDENT_AMBULATORY_CARE_PROVIDER_SITE_OTHER): Payer: 59 | Admitting: Physician Assistant

## 2017-08-19 VITALS — BP 136/85 | HR 55 | Temp 98.5°F | Ht 66.0 in | Wt 257.0 lb

## 2017-08-19 DIAGNOSIS — Z9189 Other specified personal risk factors, not elsewhere classified: Secondary | ICD-10-CM

## 2017-08-19 DIAGNOSIS — E559 Vitamin D deficiency, unspecified: Secondary | ICD-10-CM | POA: Diagnosis not present

## 2017-08-19 DIAGNOSIS — R7303 Prediabetes: Secondary | ICD-10-CM

## 2017-08-19 DIAGNOSIS — Z6841 Body Mass Index (BMI) 40.0 and over, adult: Secondary | ICD-10-CM | POA: Diagnosis not present

## 2017-08-19 MED ORDER — METFORMIN HCL 500 MG PO TABS: 500.0000 mg | ORAL_TABLET | Freq: Two times a day (BID) | ORAL | 0 refills | Status: DC

## 2017-08-19 MED ORDER — VITAMIN D (ERGOCALCIFEROL) 1.25 MG (50000 UNIT) PO CAPS: 50000.0000 [IU] | ORAL_CAPSULE | ORAL | 0 refills | Status: DC

## 2017-08-19 MED FILL — VIT D2 1.25 MG (50,000 UNIT: 1.25 MG | 28 days supply | Qty: 4 | Fill #0

## 2017-08-19 MED FILL — metFORMIN HCL 500 MG TABS: 500 | 30 days supply | Qty: 60 | Fill #0

## 2017-08-19 NOTE — Progress Notes (Signed)
Office: [phone]  /  Fax: [phone]   HPI:   Chief Complaint: OBESITY Leslie Wright is here to discuss her progress with her obesity treatment plan. She is on the Category 2 plan and is following her eating plan approximately 50 % of the time. She states she is exercising 0 minutes 0 times per week. Leslie Wright continues to do well with weight loss. She manages to be mindful of her eating and she controls her portions. Leslie Wright would like holiday eating strategies. Her weight is 257 lb (116.6 kg) today and has had a weight loss of 1 pound over a period of 2 weeks since her last visit. She has lost 20 lbs since starting treatment with [place].  Vitamin D deficiency Leslie Wright has a diagnosis of vitamin D deficiency. She is currently taking vit D and denies nausea, vomiting or muscle weakness.  Pre-Diabetes Leslie Wright has a diagnosis of pre-diabetes based on her elevated Hgb A1c and was informed this puts her at greater risk of developing diabetes. She is taking metformin currently and continues to work on diet and exercise to decrease risk of diabetes. She denies nausea, polyphagia or hypoglycemia.  At risk for diabetes Leslie Wright is at higher than average risk for developing diabetes due to her obesity and pre-diabetes. She currently denies polyuria or polydipsia.  ALLERGIES: Not on File  MEDICATIONS: Current Outpatient Medications on File Prior to Visit  Medication Sig Dispense Refill  . acetaminophen (TYLENOL) 650 MG CR tablet Take 650 mg by mouth every 8 (eight) hours as needed for pain.    Marland Kitchen atorvastatin (LIPITOR) 10 MG tablet Take 1 tablet (10 mg total) by mouth daily. 30 tablet 0  . hydrochlorothiazide (HYDRODIURIL) 12.5 MG tablet Take 1 tablet (12.5 mg total) by mouth daily. 30 tablet 0  . ibuprofen (ADVIL,MOTRIN) 200 MG tablet Take 200 mg by mouth 4 (four) times daily.    . Multiple Vitamin (MULTIVITAMIN) tablet Take 1 tablet by mouth daily.     No current facility-administered medications on file  prior to visit.     PAST MEDICAL HISTORY: Past Medical History:  Diagnosis Date  . Acid reflux   . Anemia   . Back pain   . Gallbladder problem   . High blood pressure   . Hip pain   . Pre-diabetes   . Shortness of breath   . Sleep apnea   . Swelling     PAST SURGICAL HISTORY: Past Surgical History:  Procedure Laterality Date  . CARPAL TUNNEL RELEASE    . GALLBLADDER SURGERY     April 2012  . LASIK     2000    SOCIAL HISTORY: Social History   Tobacco Use  . Smoking status: Never Smoker  . Smokeless tobacco: Never Used  Substance Use Topics  . Alcohol use: Not on file  . Drug use: Not on file    FAMILY HISTORY: Family History  Problem Relation Age of Onset  . Diabetes Mother   . Thyroid disease Mother   . Liver disease Mother   . Obesity Mother   . Hypertension Father   . Hyperlipidemia Father   . Heart disease Father   . Stroke Father   . Obesity Father     ROS: Review of Systems  Constitutional: Positive for weight loss.  Gastrointestinal: Negative for nausea and vomiting.  Genitourinary: Negative for frequency.  Musculoskeletal:       Negative muscle weakness  Endo/Heme/Allergies: Negative for polydipsia.       Negative polyphagia  Negative hypoglycemia    PHYSICAL EXAM: Blood pressure 136/85, pulse (!) 55, temperature 98.5 F (36.9 C), temperature source Oral, height 5\' 6"  (1.676 m), weight 257 lb (116.6 kg), SpO2 99 %. Body mass index is 41.48 kg/m. Physical Exam  Constitutional: She is oriented to person, place, and time. She appears well-developed and well-nourished.  Cardiovascular:  Bradycardic  Pulmonary/Chest: Effort normal.  Musculoskeletal: Normal range of motion.  Neurological: She is oriented to person, place, and time.  Skin: Skin is warm and dry.  Psychiatric: She has a normal mood and affect. Her behavior is normal.  Vitals reviewed.   RECENT LABS AND TESTS: BMET    Component Value Date/Time   NA 142 07/15/2017 1033    K 4.8 07/15/2017 1033   CL 101 07/15/2017 1033   CO2 26 07/15/2017 1033   GLUCOSE 92 07/15/2017 1033   GLUCOSE 94 12/28/2010 1530   BUN 16 07/15/2017 1033   CREATININE 1.14 (H) 07/15/2017 1033   CALCIUM 9.9 07/15/2017 1033   GFRNONAA 55 (L) 07/15/2017 1033   GFRAA 64 07/15/2017 1033   Lab Results  Component Value Date   HGBA1C 5.6 07/15/2017   HGBA1C 5.5 04/11/2017   HGBA1C 5.6 12/25/2016   HGBA1C 5.5 09/27/2016   Lab Results  Component Value Date   INSULIN 22.2 07/15/2017   INSULIN 20.4 04/11/2017   INSULIN 16.9 12/25/2016   INSULIN 20.0 09/27/2016   CBC    Component Value Date/Time   WBC 8.3 09/27/2016 1342   WBC 9.1 12/28/2010 1530   RBC 4.93 09/27/2016 1342   RBC 4.66 12/28/2010 1530   HGB 13.4 09/27/2016 1342   HCT 40.9 09/27/2016 1342   PLT 193 12/28/2010 1530   MCV 83 09/27/2016 1342   MCH 27.2 09/27/2016 1342   MCH 24.9 (L) 12/28/2010 1530   MCHC 32.8 09/27/2016 1342   MCHC 31.8 12/28/2010 1530   RDW 15.9 (H) 09/27/2016 1342   LYMPHSABS 2.3 09/27/2016 1342   MONOABS 1.1 (H) 11/10/2010 0554   EOSABS 0.6 (H) 09/27/2016 1342   BASOSABS 0.0 09/27/2016 1342   Iron/TIBC/Ferritin/ %Sat No results found for: IRON, TIBC, FERRITIN, IRONPCTSAT Lipid Panel     Component Value Date/Time   CHOL 168 07/15/2017 1033   TRIG 168 (H) 07/15/2017 1033   HDL 45 07/15/2017 1033   LDLCALC 89 07/15/2017 1033   Hepatic Function Panel     Component Value Date/Time   PROT 7.1 07/15/2017 1033   ALBUMIN 4.2 07/15/2017 1033   AST 19 07/15/2017 1033   ALT 15 07/15/2017 1033   ALKPHOS 72 07/15/2017 1033   BILITOT 0.2 07/15/2017 1033   BILIDIR 0.1 11/09/2010 1645   IBILI 0.4 11/09/2010 1645      Component Value Date/Time   TSH 1.890 09/27/2016 1342    ASSESSMENT AND PLAN: Vitamin D deficiency - Plan: Vitamin D, Ergocalciferol, (DRISDOL) 50000 units CAPS capsule  Prediabetes - Plan: metFORMIN (GLUCOPHAGE) 500 MG tablet  At risk for diabetes mellitus  Class 3  severe obesity with serious comorbidity and body mass index (BMI) of 40.0 to 44.9 in adult, unspecified obesity type ([place])  PLAN:  Vitamin D Deficiency Leslie Wright was informed that low vitamin D levels contributes to fatigue and are associated with obesity, breast, and colon cancer. She agrees to continue to take prescription Vit D @50 ,000 IU every week #4 with no refills and will follow up for routine testing of vitamin D, at least 2-3 times per year. She was informed of the risk  of over-replacement of vitamin D and agrees to not increase her dose unless he discusses this with [place] first. Leslie Wright agrees to follow up with our clinic in 2 weeks.  Pre-Diabetes Leslie Wright will continue to work on weight loss, exercise, and decreasing simple carbohydrates in her diet to help decrease the risk of diabetes. We dicussed metformin including benefits and risks. She was informed that eating too many simple carbohydrates or too many calories at one sitting increases the likelihood of GI side effects. Leslie Wright requested metformin for now and a prescription was written today for 1 month refill. Leslie Wright agreed to follow up with [place] as directed to monitor her progress.  Diabetes risk counseling Leslie Wright was given extended (15 minutes) diabetes prevention counseling today. She is 51 y.o. female and has risk factors for diabetes including obesity and pre-diabetes. We discussed intensive lifestyle modifications today with an emphasis on weight loss as well as increasing exercise and decreasing simple carbohydrates in her diet.  Obesity Leslie Wright is currently in the action stage of change. As such, her goal is to continue with weight loss efforts She has agreed to keep a food journal with 1100 to 1300 calories and 90 grams of protein daily Leslie Wright has been instructed to work up to a goal of 150 minutes of combined cardio and strengthening exercise per week for weight loss and overall health benefits. We discussed the following Behavioral  Modification Strategies today: increasing lean protein intake and work on meal planning and easy cooking plans  Leslie Wright has agreed to follow up with our clinic in 2 weeks. She was informed of the importance of frequent follow up visits to maximize her success with intensive lifestyle modifications for her multiple health conditions.  I, Doreene Nest, am acting as transcriptionist for Lacy Duverney, PA-C  I have reviewed the above documentation for accuracy and completeness, and I agree with the above. -Lacy Duverney, PA-C  I have reviewed the above note and agree with the plan. -Dennard Nip, MD   OBESITY BEHAVIORAL INTERVENTION VISIT  Today's visit was # 21 out of 22.  Starting weight: 287 lbs Starting date: 09/27/16 Today's weight : 257 lbs Today's date: 08/19/2017 Total lbs lost to date: 20 (Patients must lose 7 lbs in the first 6 months to continue with counseling)   ASK: We discussed the diagnosis of obesity with Leslie Wright today and Leslie Wright agreed to give [place] permission to discuss obesity behavioral modification therapy today.  ASSESS: Leslie Wright has the diagnosis of obesity and her BMI today is at 41.5 Leslie Wright is in the action stage of change  ADVISE: Leslie Wright was educated on the multiple health risks of obesity as well as the benefit of weight loss to improve her health. She was advised of the need for long term treatment and the importance of lifestyle modifications.  AGREE: Multiple dietary modification options and treatment options were discussed and  Leslie Wright agreed to keep a food journal with 1100 to 1300 calories and 90 grams of protein daily We discussed the following Behavioral Modification Strategies today: increasing lean protein intake and work on meal planning and easy cooking plans

## 2017-09-03 ENCOUNTER — Ambulatory Visit (INDEPENDENT_AMBULATORY_CARE_PROVIDER_SITE_OTHER): Payer: 59 | Admitting: Physician Assistant

## 2017-09-03 VITALS — BP 121/84 | HR 63 | Temp 97.9°F | Ht 66.0 in | Wt 256.0 lb

## 2017-09-03 DIAGNOSIS — Z6841 Body Mass Index (BMI) 40.0 and over, adult: Secondary | ICD-10-CM

## 2017-09-03 DIAGNOSIS — I10 Essential (primary) hypertension: Secondary | ICD-10-CM | POA: Diagnosis not present

## 2017-09-03 NOTE — Progress Notes (Signed)
Office: [phone]  /  Fax: [phone]   HPI:   Chief Complaint: OBESITY Leslie Wright is here to discuss her progress with her obesity treatment plan. She is on thekeep a food journal with 1100-1300 calories and 90 grams of protein daily and is following her eating plan approximately 50 % of the time. She states she is exercising 0 minutes 0 times per week. Malina continue to do well with weight loss. She plans her meals well and incorporates variety. She would like more meal planning ideas.  Her weight is 256 lb (116.1 kg) today and has had a weight loss of 1 pound over a period of 2 weeks since her last visit. She has lost 31 lbs since starting treatment with [place].  Hypertension HILDUR BAYER is a 51 y.o. female with hypertension. Angeleah's blood pressure is stable and she denies chest pain or shortness of breath. She is working weight loss to help control her blood pressure with the goal of decreasing her risk of heart attack and stroke. Jameah's blood pressure is not currently controlled.  ALLERGIES: Not on File  MEDICATIONS: Current Outpatient Medications on File Prior to Visit  Medication Sig Dispense Refill  . acetaminophen (TYLENOL) 650 MG CR tablet Take 650 mg by mouth every 8 (eight) hours as needed for pain.    Marland Kitchen atorvastatin (LIPITOR) 10 MG tablet Take 1 tablet (10 mg total) by mouth daily. 30 tablet 0  . hydrochlorothiazide (HYDRODIURIL) 12.5 MG tablet Take 1 tablet (12.5 mg total) by mouth daily. 30 tablet 0  . ibuprofen (ADVIL,MOTRIN) 200 MG tablet Take 200 mg by mouth 4 (four) times daily.    . metFORMIN (GLUCOPHAGE) 500 MG tablet Take 1 tablet (500 mg total) 2 (two) times daily with a meal by mouth. 60 tablet 0  . Multiple Vitamin (MULTIVITAMIN) tablet Take 1 tablet by mouth daily.    . Vitamin D, Ergocalciferol, (DRISDOL) 50000 units CAPS capsule Take 1 capsule (50,000 Units total) every 7 (seven) days by mouth. 4 capsule 0   No current facility-administered medications  on file prior to visit.     PAST MEDICAL HISTORY: Past Medical History:  Diagnosis Date  . Acid reflux   . Anemia   . Back pain   . Gallbladder problem   . High blood pressure   . Hip pain   . Pre-diabetes   . Shortness of breath   . Sleep apnea   . Swelling     PAST SURGICAL HISTORY: Past Surgical History:  Procedure Laterality Date  . CARPAL TUNNEL RELEASE    . GALLBLADDER SURGERY     April 2012  . LASIK     2000    SOCIAL HISTORY: Social History   Tobacco Use  . Smoking status: Never Smoker  . Smokeless tobacco: Never Used  Substance Use Topics  . Alcohol use: Not on file  . Drug use: Not on file    FAMILY HISTORY: Family History  Problem Relation Age of Onset  . Diabetes Mother   . Thyroid disease Mother   . Liver disease Mother   . Obesity Mother   . Hypertension Father   . Hyperlipidemia Father   . Heart disease Father   . Stroke Father   . Obesity Father     ROS: Review of Systems  Constitutional: Positive for weight loss.  Respiratory: Negative for shortness of breath.   Cardiovascular: Negative for chest pain.    PHYSICAL EXAM: Blood pressure 121/84, pulse 63, temperature 97.9  F (36.6 C), height 5\' 6"  (1.676 m), weight 256 lb (116.1 kg), SpO2 100 %. Body mass index is 41.32 kg/m. Physical Exam  Constitutional: She is oriented to person, place, and time. She appears well-developed and well-nourished.  Cardiovascular: Normal rate.  Pulmonary/Chest: Effort normal.  Musculoskeletal: Normal range of motion.  Neurological: She is oriented to person, place, and time.  Skin: Skin is warm and dry.  Psychiatric: She has a normal mood and affect. Her behavior is normal.  Vitals reviewed.   RECENT LABS AND TESTS: BMET    Component Value Date/Time   NA 142 07/15/2017 1033   K 4.8 07/15/2017 1033   CL 101 07/15/2017 1033   CO2 26 07/15/2017 1033   GLUCOSE 92 07/15/2017 1033   GLUCOSE 94 12/28/2010 1530   BUN 16 07/15/2017 1033    CREATININE 1.14 (H) 07/15/2017 1033   CALCIUM 9.9 07/15/2017 1033   GFRNONAA 55 (L) 07/15/2017 1033   GFRAA 64 07/15/2017 1033   Lab Results  Component Value Date   HGBA1C 5.6 07/15/2017   HGBA1C 5.5 04/11/2017   HGBA1C 5.6 12/25/2016   HGBA1C 5.5 09/27/2016   Lab Results  Component Value Date   INSULIN 22.2 07/15/2017   INSULIN 20.4 04/11/2017   INSULIN 16.9 12/25/2016   INSULIN 20.0 09/27/2016   CBC    Component Value Date/Time   WBC 8.3 09/27/2016 1342   WBC 9.1 12/28/2010 1530   RBC 4.93 09/27/2016 1342   RBC 4.66 12/28/2010 1530   HGB 13.4 09/27/2016 1342   HCT 40.9 09/27/2016 1342   PLT 193 12/28/2010 1530   MCV 83 09/27/2016 1342   MCH 27.2 09/27/2016 1342   MCH 24.9 (L) 12/28/2010 1530   MCHC 32.8 09/27/2016 1342   MCHC 31.8 12/28/2010 1530   RDW 15.9 (H) 09/27/2016 1342   LYMPHSABS 2.3 09/27/2016 1342   MONOABS 1.1 (H) 11/10/2010 0554   EOSABS 0.6 (H) 09/27/2016 1342   BASOSABS 0.0 09/27/2016 1342   Iron/TIBC/Ferritin/ %Sat No results found for: IRON, TIBC, FERRITIN, IRONPCTSAT Lipid Panel     Component Value Date/Time   CHOL 168 07/15/2017 1033   TRIG 168 (H) 07/15/2017 1033   HDL 45 07/15/2017 1033   LDLCALC 89 07/15/2017 1033   Hepatic Function Panel     Component Value Date/Time   PROT 7.1 07/15/2017 1033   ALBUMIN 4.2 07/15/2017 1033   AST 19 07/15/2017 1033   ALT 15 07/15/2017 1033   ALKPHOS 72 07/15/2017 1033   BILITOT 0.2 07/15/2017 1033   BILIDIR 0.1 11/09/2010 1645   IBILI 0.4 11/09/2010 1645      Component Value Date/Time   TSH 1.890 09/27/2016 1342    ASSESSMENT AND PLAN: Essential hypertension  Class 3 severe obesity with serious comorbidity and body mass index (BMI) of 40.0 to 44.9 in adult, unspecified obesity type ([facility])  PLAN:  Hypertension We discussed sodium restriction, working on healthy weight loss, and a regular exercise program as the means to achieve improved blood pressure control. Melda agreed with this  plan and agreed to follow up as directed. We will continue to monitor her blood pressure as well as her progress with the above lifestyle modifications. She will continue her medications as prescribed and will watch for signs of hypotension as she continues her lifestyle modifications. Esty agrees to follow up with our clinic in 3 weeks.  We spent > than 50% of the 15 minute visit on the counseling as documented in the note.  Obesity  Zoua is currently in the action stage of change. As such, her goal is to continue with weight loss efforts She has agreed to keep a food journal with 1100-1300 calories and 90 grams of protein daily Adalaya has been instructed to work up to a goal of 150 minutes of combined cardio and strengthening exercise per week for weight loss and overall health benefits. We discussed the following Behavioral Modification Strategies today: increasing lean protein intake and work on meal planning and easy cooking plans   Artavia has agreed to follow up with our clinic in 3 weeks. She was informed of the importance of frequent follow up visits to maximize her success with intensive lifestyle modifications for her multiple health conditions.  I, Trixie Dredge, am acting as transcriptionist for Lacy Duverney, PA-C  I have reviewed the above documentation for accuracy and completeness, and I agree with the above. -Lacy Duverney, PA-C  I have reviewed the above note and agree with the plan. -Dennard Nip, MD    Today's visit was # 22 out of 22.  Starting weight: 287 lbs Starting date: 09/27/16 Today's weight : 256 lbs  Today's date: 09/03/2017 Total lbs lost to date: 28 (Patients must lose 7 lbs in the first 6 months to continue with counseling)   ASK: We discussed the diagnosis of obesity with Ladona Ridgel today and Zionna agreed to give [place] permission to discuss obesity behavioral modification therapy today.  ASSESS: Paiden has the diagnosis of obesity and her BMI  today is 41.34 Loise is in the action stage of change   ADVISE: Toyna was educated on the multiple health risks of obesity as well as the benefit of weight loss to improve her health. She was advised of the need for long term treatment and the importance of lifestyle modifications.  AGREE: Multiple dietary modification options and treatment options were discussed and  Lucilia agreed to keep a food journal with 1100-1300 calories and 90 grams of protein daily We discussed the following Behavioral Modification Strategies today: increasing lean protein intake and work on meal planning and easy cooking plans

## 2017-09-04 ENCOUNTER — Ambulatory Visit (INDEPENDENT_AMBULATORY_CARE_PROVIDER_SITE_OTHER): Payer: 59 | Admitting: Physician Assistant

## 2017-09-04 ENCOUNTER — Ambulatory Visit (INDEPENDENT_AMBULATORY_CARE_PROVIDER_SITE_OTHER): Payer: 59

## 2017-09-04 ENCOUNTER — Encounter (INDEPENDENT_AMBULATORY_CARE_PROVIDER_SITE_OTHER): Payer: Self-pay | Admitting: Physician Assistant

## 2017-09-04 DIAGNOSIS — M1612 Unilateral primary osteoarthritis, left hip: Secondary | ICD-10-CM | POA: Diagnosis not present

## 2017-09-04 DIAGNOSIS — G8929 Other chronic pain: Secondary | ICD-10-CM

## 2017-09-04 DIAGNOSIS — M25562 Pain in left knee: Secondary | ICD-10-CM

## 2017-09-04 DIAGNOSIS — M5442 Lumbago with sciatica, left side: Secondary | ICD-10-CM

## 2017-09-04 DIAGNOSIS — M25552 Pain in left hip: Secondary | ICD-10-CM | POA: Diagnosis not present

## 2017-09-04 NOTE — Progress Notes (Signed)
Office Visit Note   Patient: Leslie Wright           Date of Birth: 05-Nov-1965           MRN: [id number] Visit Date: 09/04/2017              Requested by: No referring provider defined for this encounter. PCP: Patient, No Pcp Per   Assessment & Plan: Visit Diagnoses:  1. Chronic pain of left knee   2. Chronic left-sided low back pain with left-sided sciatica   3. Pain in left hip     Plan: Discussed with her at length that due to the fact that she has very limited internal rotation of the left hip and pain with internal rotation that her hip is causing significant if not most of her pain.  Discussed with her conservative treatment which would involve an intra-articular injection of the left hip.  Also discussed with her left total hip arthroplasty.  She would like to think about this and give [place] a call in regards to which she would like to proceed with.  Follow-Up Instructions: Return if symptoms worsen or fail to improve.   Orders:  Orders Placed This Encounter  Procedures  . XR Knee 1-2 Views Left  . XR Lumbar Spine 2-3 Views  . XR HIP UNILAT W OR W/O PELVIS 2-3 VIEWS LEFT   No orders of the defined types were placed in this encounter.     Procedures: No procedures performed   Clinical Data: No additional findings.   Subjective: Left hip and left knee pain.  HPI Leslie Wright comes in today due to left hip and left knee pain.  She denies any groin pain.  Complaining more of back and buttocks pain.  Pain radiates down to her left knee but no numbness tingling.  She states she walks with a limp this been ongoing for years getting worse.  She notes she has difficulty donning her socks shoes and getting in and out of a vehicle.  States she cannot walk for a long period of time. Review of Systems Denies fevers, chills, shortness of breath, chest pain DVT/PE, nausea or vomiting.  Objective: Vital Signs: LMP  (LMP Unknown)   Physical Exam  Constitutional: She appears  well-developed and well-nourished. No distress.  Cardiovascular: Intact distal pulses.  Pulmonary/Chest: Effort normal.  Neurological: She is alert.  Skin: She is not diaphoretic.  Psychiatric: She has a normal mood and affect. Her behavior is normal. Thought content normal.    Ortho Exam Negative straight leg raise bilaterally.  5 out of 5 strength throughout the lower extremities except for flexion of the left hip which is 4 out of 5 against resistance.  She has very limited and painful internal rotation of left hip fluid motion of the right hip without pain.  Logroll of the left hip internally causes pain.  Able to touch her toes.  Has good extension of lumbar spine without significant pain. Specialty Comments:  No specialty comments available.  Imaging: Xr Hip Unilat W Or W/o Pelvis 2-3 Views Left  Result Date: 09/04/2017 AP pelvis/ lateral view of the left hip: No acute fracture.  Bilateral hips are well located.  Signs of impingement left hip.  Left hip joint with end-stage changes including flattening and misshaping  of the femoral head.  Xr Knee 1-2 Views Left  Result Date: 09/04/2017 AP lateral views left knee: No acute fracture.  Mild narrowing of the medial compartment.  Otherwise the  knee is well preserved.  No subluxation or dislocation.  Xr Lumbar Spine 2-3 Views  Result Date: 09/04/2017 Lumbar spine: No acute fracture.  Degenerative disc disease at L3-4 L4-5 and L5-S1.  Normal lordotic curvature is maintained.  No spondylolisthesis.    PMFS History: Patient Active Problem List   Diagnosis Date Noted  . Hyperlipidemia 04/11/2017  . Class 3 obesity with serious comorbidity and body mass index (BMI) of 40.0 to 44.9 in adult 04/11/2017  . Prediabetes 02/04/2017  . Mixed hyperlipidemia 01/09/2017  . Insulin resistance 11/27/2016  . Essential hypertension 10/25/2016  . Morbid obesity ([place]) 10/25/2016  . Vitamin D deficiency 10/25/2016   Past Medical History:    Diagnosis Date  . Acid reflux   . Anemia   . Back pain   . Gallbladder problem   . High blood pressure   . Hip pain   . Pre-diabetes   . Shortness of breath   . Sleep apnea   . Swelling     Family History  Problem Relation Age of Onset  . Diabetes Mother   . Thyroid disease Mother   . Liver disease Mother   . Obesity Mother   . Hypertension Father   . Hyperlipidemia Father   . Heart disease Father   . Stroke Father   . Obesity Father     Past Surgical History:  Procedure Laterality Date  . CARPAL TUNNEL RELEASE    . GALLBLADDER SURGERY     April 2012  . LASIK     2000   Social History   Occupational History  . Occupation: Sport and exercise psychologist: [employer]  Tobacco Use  . Smoking status: Never Smoker  . Smokeless tobacco: Never Used  Substance and Sexual Activity  . Alcohol use: Not on file  . Drug use: Not on file  . Sexual activity: Not on file

## 2017-09-26 ENCOUNTER — Ambulatory Visit (INDEPENDENT_AMBULATORY_CARE_PROVIDER_SITE_OTHER): Payer: 59 | Admitting: Physician Assistant

## 2017-09-26 VITALS — BP 137/85 | HR 71 | Temp 97.5°F | Ht 66.0 in | Wt 263.0 lb

## 2017-09-26 DIAGNOSIS — Z9189 Other specified personal risk factors, not elsewhere classified: Secondary | ICD-10-CM

## 2017-09-26 DIAGNOSIS — R7303 Prediabetes: Secondary | ICD-10-CM | POA: Diagnosis not present

## 2017-09-26 DIAGNOSIS — Z6841 Body Mass Index (BMI) 40.0 and over, adult: Secondary | ICD-10-CM

## 2017-09-26 DIAGNOSIS — E7849 Other hyperlipidemia: Secondary | ICD-10-CM | POA: Diagnosis not present

## 2017-09-26 DIAGNOSIS — E559 Vitamin D deficiency, unspecified: Secondary | ICD-10-CM | POA: Diagnosis not present

## 2017-09-26 DIAGNOSIS — I10 Essential (primary) hypertension: Secondary | ICD-10-CM | POA: Diagnosis not present

## 2017-09-26 MED ORDER — VITAMIN D (ERGOCALCIFEROL) 1.25 MG (50000 UNIT) PO CAPS: 50000.0000 [IU] | ORAL_CAPSULE | ORAL | 0 refills | Status: DC

## 2017-09-26 MED ORDER — ATORVASTATIN CALCIUM 10 MG PO TABS: 10.0000 mg | ORAL_TABLET | Freq: Every day | ORAL | 0 refills | Status: DC

## 2017-09-26 MED ORDER — HYDROCHLOROTHIAZIDE 12.5 MG PO TABS: 12.5000 mg | ORAL_TABLET | Freq: Every day | ORAL | 0 refills | Status: DC

## 2017-09-26 MED ORDER — METFORMIN HCL 500 MG PO TABS: 500.0000 mg | ORAL_TABLET | Freq: Two times a day (BID) | ORAL | 0 refills | Status: DC

## 2017-09-26 MED FILL — metFORMIN HCL 500 MG TABS: 500 | 30 days supply | Qty: 60 | Fill #0

## 2017-09-26 MED FILL — VIT D2 1.25 MG (50,000 UNIT: 1.25 MG | 28 days supply | Qty: 4 | Fill #0

## 2017-09-26 MED FILL — HYDROCHLOROTHIAZIDE 12.5 MG: 12.5 | 30 days supply | Qty: 30 | Fill #0

## 2017-09-26 MED FILL — ATORVASTATIN 10 MG TABLET: 10 | 30 days supply | Qty: 30 | Fill #0

## 2017-09-26 NOTE — Progress Notes (Signed)
Office: [phone]  /  Fax: [phone]   HPI:   Chief Complaint: OBESITY Leslie Wright is here to discuss her progress with her obesity treatment plan. She is on the keep a food journal with 1100 to 1300 calories and 90 grams of protein daily and is following her eating plan approximately 0 % of the time. She states she is exercising 0 minutes 0 times per week. Leslie Wright has deviated with holiday eating and has not been as mindful of her eating. Her weight is 263 lb (119.3 kg) today and has had a weight gain of 7 pounds over a period of 3 weeks since her last visit. She has lost 24 lbs since starting treatment with [place].  Hypertension Leslie Wright is a 51 y.o. female with hypertension. Leslie Wright denies chest pain or shortness of breath on exertion. She is working weight loss to help control her blood pressure with the goal of decreasing her risk of heart attack and stroke. Leslie Wright blood pressure is currently stable.  Hyperlipidemia Leslie Wright has hyperlipidemia and has been trying to improve her cholesterol levels with intensive lifestyle modification including a low saturated fat diet, exercise and weight loss. She denies any chest pain, claudication or myalgias.  At risk for cardiovascular disease Leslie Wright is at a higher than average risk for cardiovascular disease due to obesity, hypertension and hyperlipidemia. She currently denies any chest pain.  Pre-Diabetes Leslie Wright has a diagnosis of pre-diabetes based on her elevated Hgb A1c and was informed this puts her at greater risk of developing diabetes. She is taking metformin currently and continues to work on diet and exercise to decrease risk of diabetes. She denies nausea, polyphagia or hypoglycemia.  Vitamin D deficiency Leslie Wright has a diagnosis of vitamin D deficiency. She is currently taking vit D and denies nausea, vomiting or muscle weakness.  ALLERGIES: Not on File  MEDICATIONS: Current Outpatient Medications on File Prior to Visit    Medication Sig Dispense Refill  . acetaminophen (TYLENOL) 650 MG CR tablet Take 650 mg by mouth every 8 (eight) hours as needed for pain.    Marland Kitchen atorvastatin (LIPITOR) 10 MG tablet Take 1 tablet (10 mg total) by mouth daily. 30 tablet 0  . hydrochlorothiazide (HYDRODIURIL) 12.5 MG tablet Take 1 tablet (12.5 mg total) by mouth daily. 30 tablet 0  . ibuprofen (ADVIL,MOTRIN) 200 MG tablet Take 200 mg by mouth 4 (four) times daily.    . metFORMIN (GLUCOPHAGE) 500 MG tablet Take 1 tablet (500 mg total) 2 (two) times daily with a meal by mouth. 60 tablet 0  . Multiple Vitamin (MULTIVITAMIN) tablet Take 1 tablet by mouth daily.    . Vitamin D, Ergocalciferol, (DRISDOL) 50000 units CAPS capsule Take 1 capsule (50,000 Units total) every 7 (seven) days by mouth. 4 capsule 0   No current facility-administered medications on file prior to visit.     PAST MEDICAL HISTORY: Past Medical History:  Diagnosis Date  . Acid reflux   . Anemia   . Back pain   . Gallbladder problem   . High blood pressure   . Hip pain   . Pre-diabetes   . Shortness of breath   . Sleep apnea   . Swelling     PAST SURGICAL HISTORY: Past Surgical History:  Procedure Laterality Date  . CARPAL TUNNEL RELEASE    . GALLBLADDER SURGERY     April 2012  . LASIK     2000    SOCIAL HISTORY: Social History  Tobacco Use  . Smoking status: Never Smoker  . Smokeless tobacco: Never Used  Substance Use Topics  . Alcohol use: Not on file  . Drug use: Not on file    FAMILY HISTORY: Family History  Problem Relation Age of Onset  . Diabetes Mother   . Thyroid disease Mother   . Liver disease Mother   . Obesity Mother   . Hypertension Father   . Hyperlipidemia Father   . Heart disease Father   . Stroke Father   . Obesity Father     ROS: Review of Systems  Constitutional: Negative for weight loss.  Respiratory: Negative for shortness of breath (on exertion).   Cardiovascular: Negative for chest pain and  claudication.  Gastrointestinal: Negative for nausea and vomiting.  Musculoskeletal: Negative for myalgias.       Negative muscle weakness  Endo/Heme/Allergies:       Negative polyphagia Negative hypoglycemia    PHYSICAL EXAM: Blood pressure 137/85, pulse 71, temperature (!) 97.5 F (36.4 C), temperature source Oral, height 5\' 6"  (1.676 m), weight 263 lb (119.3 kg), SpO2 99 %. Body mass index is 42.45 kg/m. Physical Exam  Constitutional: She is oriented to person, place, and time. She appears well-developed and well-nourished.  Cardiovascular: Normal rate.  Pulmonary/Chest: Effort normal.  Musculoskeletal: Normal range of motion.  Neurological: She is oriented to person, place, and time.  Skin: Skin is warm and dry.  Psychiatric: She has a normal mood and affect. Her behavior is normal.  Vitals reviewed.   RECENT LABS AND TESTS: BMET    Component Value Date/Time   NA 142 07/15/2017 1033   K 4.8 07/15/2017 1033   CL 101 07/15/2017 1033   CO2 26 07/15/2017 1033   GLUCOSE 92 07/15/2017 1033   GLUCOSE 94 12/28/2010 1530   BUN 16 07/15/2017 1033   CREATININE 1.14 (H) 07/15/2017 1033   CALCIUM 9.9 07/15/2017 1033   GFRNONAA 55 (L) 07/15/2017 1033   GFRAA 64 07/15/2017 1033   Lab Results  Component Value Date   HGBA1C 5.6 07/15/2017   HGBA1C 5.5 04/11/2017   HGBA1C 5.6 12/25/2016   HGBA1C 5.5 09/27/2016   Lab Results  Component Value Date   INSULIN 22.2 07/15/2017   INSULIN 20.4 04/11/2017   INSULIN 16.9 12/25/2016   INSULIN 20.0 09/27/2016   CBC    Component Value Date/Time   WBC 8.3 09/27/2016 1342   WBC 9.1 12/28/2010 1530   RBC 4.93 09/27/2016 1342   RBC 4.66 12/28/2010 1530   HGB 13.4 09/27/2016 1342   HCT 40.9 09/27/2016 1342   PLT 193 12/28/2010 1530   MCV 83 09/27/2016 1342   MCH 27.2 09/27/2016 1342   MCH 24.9 (L) 12/28/2010 1530   MCHC 32.8 09/27/2016 1342   MCHC 31.8 12/28/2010 1530   RDW 15.9 (H) 09/27/2016 1342   LYMPHSABS 2.3 09/27/2016  1342   MONOABS 1.1 (H) 11/10/2010 0554   EOSABS 0.6 (H) 09/27/2016 1342   BASOSABS 0.0 09/27/2016 1342   Iron/TIBC/Ferritin/ %Sat No results found for: IRON, TIBC, FERRITIN, IRONPCTSAT Lipid Panel     Component Value Date/Time   CHOL 168 07/15/2017 1033   TRIG 168 (H) 07/15/2017 1033   HDL 45 07/15/2017 1033   LDLCALC 89 07/15/2017 1033   Hepatic Function Panel     Component Value Date/Time   PROT 7.1 07/15/2017 1033   ALBUMIN 4.2 07/15/2017 1033   AST 19 07/15/2017 1033   ALT 15 07/15/2017 1033   ALKPHOS 72 07/15/2017  1033   BILITOT 0.2 07/15/2017 1033   BILIDIR 0.1 11/09/2010 1645   IBILI 0.4 11/09/2010 1645      Component Value Date/Time   TSH 1.890 09/27/2016 1342    ASSESSMENT AND PLAN: Essential hypertension - Plan: hydrochlorothiazide (HYDRODIURIL) 12.5 MG tablet  Other hyperlipidemia - Plan: atorvastatin (LIPITOR) 10 MG tablet  Prediabetes - Plan: metFORMIN (GLUCOPHAGE) 500 MG tablet  Vitamin D deficiency - Plan: Vitamin D, Ergocalciferol, (DRISDOL) 50000 units CAPS capsule  At risk for heart disease  Class 3 severe obesity with serious comorbidity and body mass index (BMI) of 40.0 to 44.9 in adult, unspecified obesity type (HCC)  PLAN:  Hypertension We discussed sodium restriction, working on healthy weight loss, and a regular exercise program as the means to achieve improved blood pressure control. Leslie Wright agreed with this plan and agreed to follow up as directed. We will continue to monitor her blood pressure as well as her progress with the above lifestyle modifications. She agrees to continue HCTZ 12.5 mg qd #30 with no refills and will watch for signs of hypotension as she continues her lifestyle modifications.  Hyperlipidemia Leslie Wright was informed of the American Heart Association Guidelines emphasizing intensive lifestyle modifications as the first line treatment for hyperlipidemia. We discussed many lifestyle modifications today in depth, and Leslie Wright  will continue to work on decreasing saturated fats such as fatty red meat, butter and many fried foods. She will also increase vegetables and lean protein in her diet and continue to work on exercise and weight loss efforts. Leslie Wright agrees to continue atorvastatin 10 mg qd #30 with no refills and follow up as directed.  Cardiovascular risk counseling Leslie Wright was given extended (15 minutes) coronary artery disease prevention counseling today. She is 51 y.o. female and has risk factors for heart disease including obesity, hypertension and hyperlipidemia. We discussed intensive lifestyle modifications today with an emphasis on specific weight loss instructions and strategies. Pt was also informed of the importance of increasing exercise and decreasing saturated fats to help prevent heart disease.  Pre-Diabetes Leslie Wright will continue to work on weight loss, exercise, and decreasing simple carbohydrates in her diet to help decrease the risk of diabetes. We dicussed metformin including benefits and risks. She was informed that eating too many simple carbohydrates or too many calories at one sitting increases the likelihood of GI side effects. Leslie Wright requested metformin for now and a prescription was written today for metformin 500 mg bid #60 with no refills. Leslie Wright agreed to follow up with [place] as directed to monitor her progress.  Vitamin D Deficiency Leslie Wright was informed that low vitamin D levels contributes to fatigue and are associated with obesity, breast, and colon cancer. She agrees to continue to take prescription Vit D @50 ,000 IU every week #4 with no refills and will follow up for routine testing of vitamin D, at least 2-3 times per year. She was informed of the risk of over-replacement of vitamin D and agrees to not increase her dose unless he discusses this with [place] first. Leslie Wright agrees to follow up with our clinic in 3 weeks.  Obesity Leslie Wright is currently in the action stage of change. As such, her goal  is to continue with weight loss efforts She has agreed to follow a lower carbohydrate, vegetable and lean protein rich diet plan Leslie Wright has been instructed to work up to a goal of 150 minutes of combined cardio and strengthening exercise per week for weight loss and overall health benefits. We discussed the following  Behavioral Modification Strategies today: increasing lean protein intake and planning for success  Leslie Wright has agreed to follow up with our clinic in 3 weeks. She was informed of the importance of frequent follow up visits to maximize her success with intensive lifestyle modifications for her multiple health conditions.  I, Doreene Nest, am acting as transcriptionist for Lacy Duverney, PA-C  I have reviewed the above documentation for accuracy and completeness, and I agree with the above. -Lacy Duverney, PA-C  I have reviewed the above note and agree with the plan. -Dennard Nip, MD

## 2017-10-16 ENCOUNTER — Ambulatory Visit (INDEPENDENT_AMBULATORY_CARE_PROVIDER_SITE_OTHER): Payer: 59 | Admitting: Physician Assistant

## 2017-10-16 VITALS — BP 136/84 | HR 68 | Temp 97.7°F | Ht 66.0 in | Wt 260.0 lb

## 2017-10-16 DIAGNOSIS — E559 Vitamin D deficiency, unspecified: Secondary | ICD-10-CM

## 2017-10-16 DIAGNOSIS — I10 Essential (primary) hypertension: Secondary | ICD-10-CM | POA: Diagnosis not present

## 2017-10-16 DIAGNOSIS — Z6841 Body Mass Index (BMI) 40.0 and over, adult: Secondary | ICD-10-CM

## 2017-10-16 DIAGNOSIS — Z9189 Other specified personal risk factors, not elsewhere classified: Secondary | ICD-10-CM | POA: Diagnosis not present

## 2017-10-16 MED ORDER — VITAMIN D (ERGOCALCIFEROL) 1.25 MG (50000 UNIT) PO CAPS
50000.0000 [IU] | ORAL_CAPSULE | ORAL | 0 refills | Status: DC
Start: 2017-10-16 — End: 2017-11-06

## 2017-10-16 NOTE — Progress Notes (Addendum)
Office: [phone]  /  Fax: [phone]   HPI:   Chief Complaint: OBESITY Leslie Wright is here to discuss her progress with her obesity treatment plan. Leslie Wright is on the lower carbohydrate, vegetable and lean protein rich diet plan and is following her eating plan approximately 0 % of the time. Leslie Wright states Leslie Wright is exercising 0 minutes 0 times per week. Leslie Wright continues to do well with weight loss. Leslie Wright tolerated the low carbohydrate meal plan well but has not eliminated all carbohydrates as instructed. Leslie Wright would like more variety with her meals.  Her weight is 260 lb (117.9 kg) today and has had a weight loss of 3 pounds over a period of 3 weeks since her last visit. Leslie Wright has lost 27 lbs since starting treatment with [place].  Vitamin D deficiency Leslie Wright has a diagnosis of vitamin D deficiency. Leslie Wright is currently taking prescription Vit D and denies nausea, vomiting or muscle weakness.  Hypertension Leslie Wright is a 52 y.o. female with hypertension. Leslie Wright's blood pressure is stable. Leslie Wright states Leslie Wright has not been taking her hydrochlorothiazide and is retaining fluid on her feet. Leslie Wright denies chest pain or shortness of breath. Leslie Wright is working weight loss to help control her blood pressure with the goal of decreasing her risk of heart attack and stroke. Leslie Wright's blood pressure is currently controlled.  At risk for cardiovascular disease Leslie Wright is at a higher than average risk for cardiovascular disease due to obesity and hypertension. Leslie Wright currently denies any chest pain.  ALLERGIES: Not on File  MEDICATIONS: Current Outpatient Medications on File Prior to Visit  Medication Sig Dispense Refill  . acetaminophen (TYLENOL) 650 MG CR tablet Take 650 mg by mouth every 8 (eight) hours as needed for pain.    Marland Kitchen atorvastatin (LIPITOR) 10 MG tablet Take 1 tablet (10 mg total) by mouth daily. 30 tablet 0  . hydrochlorothiazide (HYDRODIURIL) 12.5 MG tablet Take 1 tablet (12.5 mg total) by mouth daily. 30 tablet 0  .  ibuprofen (ADVIL,MOTRIN) 200 MG tablet Take 200 mg by mouth 4 (four) times daily.    . metFORMIN (GLUCOPHAGE) 500 MG tablet Take 1 tablet (500 mg total) by mouth 2 (two) times daily with a meal. 60 tablet 0  . Multiple Vitamin (MULTIVITAMIN) tablet Take 1 tablet by mouth daily.    . Vitamin D, Ergocalciferol, (DRISDOL) 50000 units CAPS capsule Take 1 capsule (50,000 Units total) by mouth every 7 (seven) days. 4 capsule 0   No current facility-administered medications on file prior to visit.     PAST MEDICAL HISTORY: Past Medical History:  Diagnosis Date  . Acid reflux   . Anemia   . Back pain   . Gallbladder problem   . High blood pressure   . Hip pain   . Pre-diabetes   . Shortness of breath   . Sleep apnea   . Swelling     PAST SURGICAL HISTORY: Past Surgical History:  Procedure Laterality Date  . CARPAL TUNNEL RELEASE    . GALLBLADDER SURGERY     April 2012  . LASIK     2000    SOCIAL HISTORY: Social History   Tobacco Use  . Smoking status: Never Smoker  . Smokeless tobacco: Never Used  Substance Use Topics  . Alcohol use: Not on file  . Drug use: Not on file    FAMILY HISTORY: Family History  Problem Relation Age of Onset  . Diabetes Mother   . Thyroid disease Mother   .  Liver disease Mother   . Obesity Mother   . Hypertension Father   . Hyperlipidemia Father   . Heart disease Father   . Stroke Father   . Obesity Father     ROS: Review of Systems  Constitutional: Positive for weight loss.  Respiratory: Negative for shortness of breath.   Cardiovascular: Negative for chest pain.  Gastrointestinal: Negative for nausea and vomiting.  Musculoskeletal:       Negative muscle weakness    PHYSICAL EXAM: Blood pressure 136/84, pulse 68, temperature 97.7 F (36.5 C), temperature source Oral, height 5\' 6"  (1.676 m), weight 260 lb (117.9 kg), SpO2 98 %. Body mass index is 41.97 kg/m. Physical Exam  Constitutional: Leslie Wright is oriented to person, place, and  time. Leslie Wright appears well-developed and well-nourished.  Cardiovascular: Normal rate.  Pulmonary/Chest: Effort normal.  Musculoskeletal: Normal range of motion.  Neurological: Leslie Wright is oriented to person, place, and time.  Skin: Skin is warm and dry.  Psychiatric: Leslie Wright has a normal mood and affect. Her behavior is normal.  Vitals reviewed.   RECENT LABS AND TESTS: BMET    Component Value Date/Time   NA 142 07/15/2017 1033   K 4.8 07/15/2017 1033   CL 101 07/15/2017 1033   CO2 26 07/15/2017 1033   GLUCOSE 92 07/15/2017 1033   GLUCOSE 94 12/28/2010 1530   BUN 16 07/15/2017 1033   CREATININE 1.14 (H) 07/15/2017 1033   CALCIUM 9.9 07/15/2017 1033   GFRNONAA 55 (L) 07/15/2017 1033   GFRAA 64 07/15/2017 1033   Lab Results  Component Value Date   HGBA1C 5.6 07/15/2017   HGBA1C 5.5 04/11/2017   HGBA1C 5.6 12/25/2016   HGBA1C 5.5 09/27/2016   Lab Results  Component Value Date   INSULIN 22.2 07/15/2017   INSULIN 20.4 04/11/2017   INSULIN 16.9 12/25/2016   INSULIN 20.0 09/27/2016   CBC    Component Value Date/Time   WBC 8.3 09/27/2016 1342   WBC 9.1 12/28/2010 1530   RBC 4.93 09/27/2016 1342   RBC 4.66 12/28/2010 1530   HGB 13.4 09/27/2016 1342   HCT 40.9 09/27/2016 1342   PLT 193 12/28/2010 1530   MCV 83 09/27/2016 1342   MCH 27.2 09/27/2016 1342   MCH 24.9 (L) 12/28/2010 1530   MCHC 32.8 09/27/2016 1342   MCHC 31.8 12/28/2010 1530   RDW 15.9 (H) 09/27/2016 1342   LYMPHSABS 2.3 09/27/2016 1342   MONOABS 1.1 (H) 11/10/2010 0554   EOSABS 0.6 (H) 09/27/2016 1342   BASOSABS 0.0 09/27/2016 1342   Iron/TIBC/Ferritin/ %Sat No results found for: IRON, TIBC, FERRITIN, IRONPCTSAT Lipid Panel     Component Value Date/Time   CHOL 168 07/15/2017 1033   TRIG 168 (H) 07/15/2017 1033   HDL 45 07/15/2017 1033   LDLCALC 89 07/15/2017 1033   Hepatic Function Panel     Component Value Date/Time   PROT 7.1 07/15/2017 1033   ALBUMIN 4.2 07/15/2017 1033   AST 19 07/15/2017 1033     ALT 15 07/15/2017 1033   ALKPHOS 72 07/15/2017 1033   BILITOT 0.2 07/15/2017 1033   BILIDIR 0.1 11/09/2010 1645   IBILI 0.4 11/09/2010 1645      Component Value Date/Time   TSH 1.890 09/27/2016 1342    ASSESSMENT AND PLAN: Vitamin D deficiency - Plan: Vitamin D, Ergocalciferol, (DRISDOL) 50000 units CAPS capsule  Essential hypertension  At risk for heart disease  Class 3 severe obesity with serious comorbidity and body mass index (BMI) of 40.0 to  44.9 in adult, unspecified obesity type ([place])  PLAN:  Vitamin D Deficiency Leslie Wright was informed that low vitamin D levels contributes to fatigue and are associated with obesity, breast, and colon cancer. Leslie Wright agrees to continue taking prescription Vit D @50 ,000 IU every week #4 and we will refill for 1 month. Leslie Wright will follow up for routine testing of vitamin D, at least 2-3 times per year. Leslie Wright was informed of the risk of over-replacement of vitamin D and agrees to not increase her dose unless Leslie Wright discusses this with [place] first. Leslie Wright agrees to follow up with our clinic in 3 weeks.  Hypertension We discussed sodium restriction, working on healthy weight loss, and a regular exercise program as the means to achieve improved blood pressure control. Leslie Wright agreed with this plan and agreed to follow up as directed. We will continue to monitor her blood pressure as well as her progress with the above lifestyle modifications. Leslie Wright agrees to continue her medications as prescribed and will watch for signs of hypotension as Leslie Wright continues her lifestyle modifications. Leslie Wright agrees to follow up with our clinic in 3 weeks.  Cardiovascular risk counselling Leslie Wright was given extended (15 minutes) coronary artery disease prevention counseling today. Leslie Wright is 52 y.o. female and has risk factors for heart disease including obesity and hypertension. We discussed intensive lifestyle modifications today with an emphasis on specific weight loss instructions and  strategies. Leslie Wright was also informed of the importance of increasing exercise and decreasing saturated fats to help prevent heart disease.  Obesity Leslie Wright is currently in the action stage of change. As such, her goal is to continue with weight loss efforts Leslie Wright has agreed to change to keep a food journal with 1100-1300 calories and 90 grams of protein daily Leslie Wright has been instructed to work up to a goal of 150 minutes of combined cardio and strengthening exercise per week for weight loss and overall health benefits. We discussed the following Behavioral Modification Strategies today: increasing lean protein intake and keeping healthy foods in the home   Arnesha has agreed to follow up with our clinic in 3 weeks. Leslie Wright was informed of the importance of frequent follow up visits to maximize her success with intensive lifestyle modifications for her multiple health conditions.   Wilhemena Durie, am acting as transcriptionist for Lacy Duverney, PA-C I, Lacy Duverney [facility], have reviewed this note and agree with its contents. I have reviewed the above documentation for accuracy and completeness, and I agree with the above. -Dennard Nip, MD

## 2017-10-18 MED FILL — VIT D2 1.25 MG (50,000 UNIT: 1.25 MG | 28 days supply | Qty: 4 | Fill #0

## 2017-11-06 ENCOUNTER — Ambulatory Visit (INDEPENDENT_AMBULATORY_CARE_PROVIDER_SITE_OTHER): Payer: 59 | Admitting: Physician Assistant

## 2017-11-06 VITALS — BP 127/79 | HR 74 | Temp 98.0°F | Ht 66.0 in | Wt 259.0 lb

## 2017-11-06 DIAGNOSIS — E559 Vitamin D deficiency, unspecified: Secondary | ICD-10-CM

## 2017-11-06 DIAGNOSIS — E7849 Other hyperlipidemia: Secondary | ICD-10-CM | POA: Diagnosis not present

## 2017-11-06 DIAGNOSIS — I10 Essential (primary) hypertension: Secondary | ICD-10-CM | POA: Diagnosis not present

## 2017-11-06 DIAGNOSIS — Z9189 Other specified personal risk factors, not elsewhere classified: Secondary | ICD-10-CM

## 2017-11-06 DIAGNOSIS — R7303 Prediabetes: Secondary | ICD-10-CM | POA: Diagnosis not present

## 2017-11-06 DIAGNOSIS — Z6841 Body Mass Index (BMI) 40.0 and over, adult: Secondary | ICD-10-CM

## 2017-11-06 MED ORDER — METFORMIN HCL 500 MG PO TABS: 500.0000 mg | ORAL_TABLET | Freq: Two times a day (BID) | ORAL | 0 refills | Status: DC

## 2017-11-06 MED ORDER — ATORVASTATIN CALCIUM 10 MG PO TABS: 10.0000 mg | ORAL_TABLET | Freq: Every day | ORAL | 0 refills | Status: DC

## 2017-11-06 MED ORDER — HYDROCHLOROTHIAZIDE 12.5 MG PO TABS: 12.5000 mg | ORAL_TABLET | Freq: Every day | ORAL | 0 refills | Status: DC

## 2017-11-06 MED ORDER — VITAMIN D (ERGOCALCIFEROL) 1.25 MG (50000 UNIT) PO CAPS: 50000.0000 [IU] | ORAL_CAPSULE | ORAL | 0 refills | Status: DC

## 2017-11-06 MED FILL — HYDROCHLOROTHIAZIDE 12.5 MG: 12.5 | 30 days supply | Qty: 30 | Fill #0

## 2017-11-06 MED FILL — ATORVASTATIN 10 MG TABLET: 10 | 30 days supply | Qty: 30 | Fill #0

## 2017-11-06 MED FILL — metFORMIN HCL 500 MG TABS: 500 | 30 days supply | Qty: 60 | Fill #0

## 2017-11-06 NOTE — Progress Notes (Signed)
Office: [phone]  /  Fax: [phone]   HPI:   Chief Complaint: OBESITY Leslie Wright is here to discuss her progress with her obesity treatment plan. She is on the keep a food journal with 1100 to 1300 calories and 90 grams of protein daily and is following her eating plan approximately 50 % of the time. She states she is exercising 0 minutes 0 times per week. Leslie Wright continues to do well with weight loss. She incorporates variety to her meals, but does not journal all her meals. Leslie Wright was advised on going back on a structured meal plan, as she states it is easier to follow. Her weight is 259 lb (117.5 kg) today and has had a weight loss of 1 pound over a period of 3 weeks since her last visit. She has lost 28 lbs since starting treatment with [place].  Hypertension Leslie Wright is a 52 y.o. female with hypertension. She admits she is not taking her diuretic. Leslie Wright states she feels swelling of her ankles. She was advised against skipping her medications. Leslie Wright denies chest pain or shortness of breath on exertion. She is working weight loss to help control her blood pressure with the goal of decreasing her risk of heart attack and stroke. Leslie Wright blood pressure is stable.  Hyperlipidemia Leslie Wright has hyperlipidemia and has been trying to improve her cholesterol levels with intensive lifestyle modification including a low saturated fat diet, exercise and weight loss. She denies any chest pain or claudication.  At risk for cardiovascular disease Leslie Wright is at a higher than average risk for cardiovascular disease due to obesity, hypertension and hyperlipidemia. She currently denies any chest pain.  Vitamin D deficiency Leslie Wright has a diagnosis of vitamin D deficiency. She is currently taking vit D and denies nausea, vomiting or muscle weakness.   Ref. Range 07/15/2017 10:33  Vitamin D, 25-Hydroxy Latest Ref Range: 30.0 - 100.0 ng/mL 33.4   Pre-Diabetes Leslie Wright has a diagnosis of  pre-diabetes based on her elevated Hgb A1c and was informed this puts her at greater risk of developing diabetes. She is taking metformin currently and continues to work on diet and exercise to decrease risk of diabetes. She denies nausea, polyphagia or hypoglycemia.  ALLERGIES: Not on File  MEDICATIONS: Current Outpatient Medications on File Prior to Visit  Medication Sig Dispense Refill  . acetaminophen (TYLENOL) 650 MG CR tablet Take 650 mg by mouth every 8 (eight) hours as needed for pain.    Marland Kitchen atorvastatin (LIPITOR) 10 MG tablet Take 1 tablet (10 mg total) by mouth daily. 30 tablet 0  . hydrochlorothiazide (HYDRODIURIL) 12.5 MG tablet Take 1 tablet (12.5 mg total) by mouth daily. 30 tablet 0  . ibuprofen (ADVIL,MOTRIN) 200 MG tablet Take 200 mg by mouth 4 (four) times daily.    . metFORMIN (GLUCOPHAGE) 500 MG tablet Take 1 tablet (500 mg total) by mouth 2 (two) times daily with a meal. 60 tablet 0  . Multiple Vitamin (MULTIVITAMIN) tablet Take 1 tablet by mouth daily.    . Vitamin D, Ergocalciferol, (DRISDOL) 50000 units CAPS capsule Take 1 capsule (50,000 Units total) by mouth every 7 (seven) days. 4 capsule 0   No current facility-administered medications on file prior to visit.     PAST MEDICAL HISTORY: Past Medical History:  Diagnosis Date  . Acid reflux   . Anemia   . Back pain   . Gallbladder problem   . High blood pressure   . Hip pain   .  Pre-diabetes   . Shortness of breath   . Sleep apnea   . Swelling     PAST SURGICAL HISTORY: Past Surgical History:  Procedure Laterality Date  . CARPAL TUNNEL RELEASE    . GALLBLADDER SURGERY     April 2012  . LASIK     2000    SOCIAL HISTORY: Social History   Tobacco Use  . Smoking status: Never Smoker  . Smokeless tobacco: Never Used  Substance Use Topics  . Alcohol use: Not on file  . Drug use: Not on file    FAMILY HISTORY: Family History  Problem Relation Age of Onset  . Diabetes Mother   . Thyroid  disease Mother   . Liver disease Mother   . Obesity Mother   . Hypertension Father   . Hyperlipidemia Father   . Heart disease Father   . Stroke Father   . Obesity Father     ROS: Review of Systems  Constitutional: Positive for weight loss.  Respiratory: Negative for shortness of breath (on exertion).   Cardiovascular: Negative for chest pain and claudication.  Gastrointestinal: Negative for nausea and vomiting.  Musculoskeletal:       Negative muscle weakness  Endo/Heme/Allergies:       Negative polyphagia Negative hypoglycemia    PHYSICAL EXAM: Blood pressure 127/79, pulse 74, temperature 98 F (36.7 C), temperature source Oral, height 5\' 6"  (1.676 m), weight 259 lb (117.5 kg), SpO2 99 %. Body mass index is 41.8 kg/m. Physical Exam  Constitutional: She is oriented to person, place, and time. She appears well-developed and well-nourished.  Cardiovascular: Normal rate.  Pulmonary/Chest: Effort normal.  Musculoskeletal: Normal range of motion.  Neurological: She is oriented to person, place, and time.  Skin: Skin is warm and dry.  Psychiatric: She has a normal mood and affect. Her behavior is normal.  Vitals reviewed.   RECENT LABS AND TESTS: BMET    Component Value Date/Time   NA 142 07/15/2017 1033   K 4.8 07/15/2017 1033   CL 101 07/15/2017 1033   CO2 26 07/15/2017 1033   GLUCOSE 92 07/15/2017 1033   GLUCOSE 94 12/28/2010 1530   BUN 16 07/15/2017 1033   CREATININE 1.14 (H) 07/15/2017 1033   CALCIUM 9.9 07/15/2017 1033   GFRNONAA 55 (L) 07/15/2017 1033   GFRAA 64 07/15/2017 1033   Lab Results  Component Value Date   HGBA1C 5.6 07/15/2017   HGBA1C 5.5 04/11/2017   HGBA1C 5.6 12/25/2016   HGBA1C 5.5 09/27/2016   Lab Results  Component Value Date   INSULIN 22.2 07/15/2017   INSULIN 20.4 04/11/2017   INSULIN 16.9 12/25/2016   INSULIN 20.0 09/27/2016   CBC    Component Value Date/Time   WBC 8.3 09/27/2016 1342   WBC 9.1 12/28/2010 1530   RBC 4.93  09/27/2016 1342   RBC 4.66 12/28/2010 1530   HGB 13.4 09/27/2016 1342   HCT 40.9 09/27/2016 1342   PLT 193 12/28/2010 1530   MCV 83 09/27/2016 1342   MCH 27.2 09/27/2016 1342   MCH 24.9 (L) 12/28/2010 1530   MCHC 32.8 09/27/2016 1342   MCHC 31.8 12/28/2010 1530   RDW 15.9 (H) 09/27/2016 1342   LYMPHSABS 2.3 09/27/2016 1342   MONOABS 1.1 (H) 11/10/2010 0554   EOSABS 0.6 (H) 09/27/2016 1342   BASOSABS 0.0 09/27/2016 1342   Iron/TIBC/Ferritin/ %Sat No results found for: IRON, TIBC, FERRITIN, IRONPCTSAT Lipid Panel     Component Value Date/Time   CHOL 168 07/15/2017 1033  TRIG 168 (H) 07/15/2017 1033   HDL 45 07/15/2017 1033   LDLCALC 89 07/15/2017 1033   Hepatic Function Panel     Component Value Date/Time   PROT 7.1 07/15/2017 1033   ALBUMIN 4.2 07/15/2017 1033   AST 19 07/15/2017 1033   ALT 15 07/15/2017 1033   ALKPHOS 72 07/15/2017 1033   BILITOT 0.2 07/15/2017 1033   BILIDIR 0.1 11/09/2010 1645   IBILI 0.4 11/09/2010 1645      Component Value Date/Time   TSH 1.890 09/27/2016 1342     Ref. Range 07/15/2017 10:33  Vitamin D, 25-Hydroxy Latest Ref Range: 30.0 - 100.0 ng/mL 33.4   ASSESSMENT AND PLAN: Essential hypertension - Plan: hydrochlorothiazide (HYDRODIURIL) 12.5 MG tablet  Other hyperlipidemia - Plan: Lipid Panel With LDL/HDL Ratio, atorvastatin (LIPITOR) 10 MG tablet  Vitamin D deficiency - Plan: VITAMIN D 25 Hydroxy (Vit-D Deficiency, Fractures), Vitamin D, Ergocalciferol, (DRISDOL) 50000 units CAPS capsule  Prediabetes - Plan: Comprehensive metabolic panel, Hemoglobin A1c, Insulin, random, metFORMIN (GLUCOPHAGE) 500 MG tablet  At risk for heart disease  Class 3 severe obesity with serious comorbidity and body mass index (BMI) of 40.0 to 44.9 in adult, unspecified obesity type (HCC)  PLAN:  Hypertension We discussed sodium restriction, working on healthy weight loss, and a regular exercise program as the means to achieve improved blood pressure  control. Leslie Wright agreed with this plan and agreed to follow up as directed. We will check labs and will continue to monitor her blood pressure as well as her progress with the above lifestyle modifications. Leslie Wright will continue her medications as prescribed. We will refill HCTZ 12.5 mg qd #30 with no refills and she will watch for signs of hypotension as she continues her lifestyle modifications.   Hyperlipidemia Leslie Wright was informed of the American Heart Association Guidelines emphasizing intensive lifestyle modifications as the first line treatment for hyperlipidemia. We discussed many lifestyle modifications today in depth, and Leslie Wright will continue to work on decreasing saturated fats such as fatty red meat, butter and many fried foods. She will also increase vegetables and lean protein in her diet and continue to work on exercise and weight loss efforts. We will check labs and Tanicka agrees to continue lipitor 10 mg qd #30 with no refills. Leslie Wright agrees to follow up with our clinic in 3 weeks.  Cardiovascular risk counseling Leslie Wright was given extended (15 minutes) coronary artery disease prevention counseling today. She is 52 y.o. female and has risk factors for heart disease including obesity, hypertension and hyperlipidemia. We discussed intensive lifestyle modifications today with an emphasis on specific weight loss instructions and strategies. Pt was also informed of the importance of increasing exercise and decreasing saturated fats to help prevent heart disease.  Vitamin D Deficiency Leslie Wright was informed that low vitamin D levels contributes to fatigue and are associated with obesity, breast, and colon cancer. She agrees to continue to take prescription Vit D @50 ,000 IU every week #4 with no refills. We will check labs and will follow up for routine testing of vitamin D, at least 2-3 times per year. She was informed of the risk of over-replacement of vitamin D and agrees to not increase her dose  unless she discusses this with [place] first. Kilyn agrees to follow up with our clinic in 3 weeks.  Pre-Diabetes Saquoia will continue to work on weight loss, exercise, and decreasing simple carbohydrates in her diet to help decrease the risk of diabetes. We dicussed metformin including benefits and risks. She was  informed that eating too many simple carbohydrates or too many calories at one sitting increases the likelihood of GI side effects. Lorah agrees to continue metformin 500 mg bid #60 with no refills. We will check labs and Ece agreed to follow up with [place] as directed to monitor her progress.  Obesity Jeffrey is currently in the action stage of change. As such, her goal is to continue with weight loss efforts She has agreed to follow the Category 2 plan Avonna has been instructed to work up to a goal of 150 minutes of combined cardio and strengthening exercise per week for weight loss and overall health benefits. We discussed the following Behavioral Modification Strategies today: increasing lean protein intake and planning for success  Jeanelle has agreed to follow up with our clinic in 3 weeks. She was informed of the importance of frequent follow up visits to maximize her success with intensive lifestyle modifications for her multiple health conditions.  Corey Skains, am acting as transcriptionist for [employer], PA-C I, Lacy Duverney [facility], have reviewed this note and agree with its content.

## 2017-11-07 LAB — COMPREHENSIVE METABOLIC PANEL
ALT: 22 IU/L (ref 0–32)
AST: 16 IU/L (ref 0–40)
Albumin/Globulin Ratio: 1.4 (ref 1.2–2.2)
Albumin: 3.9 g/dL (ref 3.5–5.5)
Alkaline Phosphatase: 67 IU/L (ref 39–117)
BUN/Creatinine Ratio: 14 (ref 9–23)
BUN: 15 mg/dL (ref 6–24)
Bilirubin Total: 0.3 mg/dL (ref 0.0–1.2)
CALCIUM: 9.3 mg/dL (ref 8.7–10.2)
CO2: 22 mmol/L (ref 20–29)
Chloride: 104 mmol/L (ref 96–106)
Creatinine, Ser: 1.07 mg/dL — ABNORMAL HIGH (ref 0.57–1.00)
GFR, EST AFRICAN AMERICAN: 69 mL/min/{1.73_m2} (ref >59)
GFR, EST NON AFRICAN AMERICAN: 60 mL/min/{1.73_m2} (ref >59)
Globulin, Total: 2.7 g/dL (ref 1.5–4.5)
Glucose: 89 mg/dL (ref 65–99)
Potassium: 4.7 mmol/L (ref 3.5–5.2)
Sodium: 141 mmol/L (ref 134–144)
TOTAL PROTEIN: 6.6 g/dL (ref 6.0–8.5)

## 2017-11-07 LAB — LIPID PANEL WITH LDL/HDL RATIO
CHOLESTEROL TOTAL: 163 mg/dL (ref 100–199)
HDL: 36 mg/dL — AB (ref >39)
LDL Calculated: 101 mg/dL — ABNORMAL HIGH (ref 0–99)
LDl/HDL Ratio: 2.8 ratio (ref 0.0–3.2)
Triglycerides: 130 mg/dL (ref 0–149)
VLDL CHOLESTEROL CAL: 26 mg/dL (ref 5–40)

## 2017-11-07 LAB — HEMOGLOBIN A1C
ESTIMATED AVERAGE GLUCOSE: 114 mg/dL
Hgb A1c MFr Bld: 5.6 % (ref 4.8–5.6)

## 2017-11-07 LAB — INSULIN, RANDOM: INSULIN: 20.3 u[IU]/mL (ref 2.6–24.9)

## 2017-11-07 LAB — VITAMIN D 25 HYDROXY (VIT D DEFICIENCY, FRACTURES): VIT D 25 HYDROXY: 29.8 ng/mL — AB (ref 30.0–100.0)

## 2017-11-27 ENCOUNTER — Ambulatory Visit (INDEPENDENT_AMBULATORY_CARE_PROVIDER_SITE_OTHER): Payer: 59 | Admitting: Physician Assistant

## 2017-11-27 VITALS — BP 109/75 | HR 68 | Temp 98.1°F | Ht 66.0 in | Wt 258.0 lb

## 2017-11-27 DIAGNOSIS — R748 Abnormal levels of other serum enzymes: Secondary | ICD-10-CM | POA: Diagnosis not present

## 2017-11-27 DIAGNOSIS — Z6841 Body Mass Index (BMI) 40.0 and over, adult: Secondary | ICD-10-CM

## 2017-11-27 NOTE — Progress Notes (Signed)
Office: [phone]  /  Fax: [phone]   HPI:   Chief Complaint: OBESITY Akylah is here to discuss her progress with her obesity treatment plan. She is on the Category 2 plan and is following her eating plan approximately 60 % of the time. She states she is exercising 0 minutes 0 times per week. Ansleigh continues to do well with weight loss. She has issues preplanning her meals, as her fridge burned out. She has been incorporating more canned meats and seafood.  Her weight is 258 lb (117 kg) today and has had a weight loss of 1 pound over a period of 3 weeks since her last visit. She has lost 29 lbs since starting treatment with [place].  Elevated Creatinine Lynnsey had elevated creatinine in the past, number now improved to 1.07 and GFR back to normal. Atasha has a history of hypertension. She has not been taking her diuretic in the past, but now is back on her diuretic and blood pressure is normal. States she keeps up with her water intake. She denies any abdominal, flank, or back pain, dysuria, or hematuria.   ALLERGIES: Not on File  MEDICATIONS: Current Outpatient Medications on File Prior to Visit  Medication Sig Dispense Refill  . acetaminophen (TYLENOL) 650 MG CR tablet Take 650 mg by mouth every 8 (eight) hours as needed for pain.    Marland Kitchen atorvastatin (LIPITOR) 10 MG tablet Take 1 tablet (10 mg total) by mouth daily. 30 tablet 0  . hydrochlorothiazide (HYDRODIURIL) 12.5 MG tablet Take 1 tablet (12.5 mg total) by mouth daily. 30 tablet 0  . ibuprofen (ADVIL,MOTRIN) 200 MG tablet Take 200 mg by mouth 2 (two) times daily.     . metFORMIN (GLUCOPHAGE) 500 MG tablet Take 1 tablet (500 mg total) by mouth 2 (two) times daily with a meal. (Patient taking differently: Take 500 mg by mouth daily with breakfast. ) 60 tablet 0  . Multiple Vitamin (MULTIVITAMIN) tablet Take 1 tablet by mouth daily.    . Vitamin D, Ergocalciferol, (DRISDOL) 50000 units CAPS capsule Take 1 capsule (50,000 Units  total) by mouth every 7 (seven) days. 4 capsule 0   No current facility-administered medications on file prior to visit.     PAST MEDICAL HISTORY: Past Medical History:  Diagnosis Date  . Acid reflux   . Anemia   . Back pain   . Gallbladder problem   . High blood pressure   . Hip pain   . Pre-diabetes   . Shortness of breath   . Sleep apnea   . Swelling     PAST SURGICAL HISTORY: Past Surgical History:  Procedure Laterality Date  . CARPAL TUNNEL RELEASE    . GALLBLADDER SURGERY     April 2012  . LASIK     2000    SOCIAL HISTORY: Social History   Tobacco Use  . Smoking status: Never Smoker  . Smokeless tobacco: Never Used  Substance Use Topics  . Alcohol use: Not on file  . Drug use: Not on file    FAMILY HISTORY: Family History  Problem Relation Age of Onset  . Diabetes Mother   . Thyroid disease Mother   . Liver disease Mother   . Obesity Mother   . Hypertension Father   . Hyperlipidemia Father   . Heart disease Father   . Stroke Father   . Obesity Father     ROS: Review of Systems  Constitutional: Positive for weight loss.  Gastrointestinal: Negative for abdominal  pain.  Genitourinary: Negative for dysuria, flank pain and hematuria.  Musculoskeletal: Negative for back pain.    PHYSICAL EXAM: Blood pressure 109/75, pulse 68, temperature 98.1 F (36.7 C), temperature source Oral, height 5\' 6"  (1.676 m), weight 258 lb (117 kg), SpO2 98 %. Body mass index is 41.64 kg/m. Physical Exam  Constitutional: She is oriented to person, place, and time. She appears well-developed and well-nourished.  Cardiovascular: Normal rate.  Pulmonary/Chest: Effort normal.  Musculoskeletal: Normal range of motion.  Neurological: She is oriented to person, place, and time.  Skin: Skin is warm and dry.  Psychiatric: She has a normal mood and affect. Her behavior is normal.  Vitals reviewed.   RECENT LABS AND TESTS: BMET    Component Value Date/Time   NA 141  11/06/2017 0850   K 4.7 11/06/2017 0850   CL 104 11/06/2017 0850   CO2 22 11/06/2017 0850   GLUCOSE 89 11/06/2017 0850   GLUCOSE 94 12/28/2010 1530   BUN 15 11/06/2017 0850   CREATININE 1.07 (H) 11/06/2017 0850   CALCIUM 9.3 11/06/2017 0850   GFRNONAA 60 11/06/2017 0850   GFRAA 69 11/06/2017 0850   Lab Results  Component Value Date   HGBA1C 5.6 11/06/2017   HGBA1C 5.6 07/15/2017   HGBA1C 5.5 04/11/2017   HGBA1C 5.6 12/25/2016   HGBA1C 5.5 09/27/2016   Lab Results  Component Value Date   INSULIN 20.3 11/06/2017   INSULIN 22.2 07/15/2017   INSULIN 20.4 04/11/2017   INSULIN 16.9 12/25/2016   INSULIN 20.0 09/27/2016   CBC    Component Value Date/Time   WBC 8.3 09/27/2016 1342   WBC 9.1 12/28/2010 1530   RBC 4.93 09/27/2016 1342   RBC 4.66 12/28/2010 1530   HGB 13.4 09/27/2016 1342   HCT 40.9 09/27/2016 1342   PLT 193 12/28/2010 1530   MCV 83 09/27/2016 1342   MCH 27.2 09/27/2016 1342   MCH 24.9 (L) 12/28/2010 1530   MCHC 32.8 09/27/2016 1342   MCHC 31.8 12/28/2010 1530   RDW 15.9 (H) 09/27/2016 1342   LYMPHSABS 2.3 09/27/2016 1342   MONOABS 1.1 (H) 11/10/2010 0554   EOSABS 0.6 (H) 09/27/2016 1342   BASOSABS 0.0 09/27/2016 1342   Iron/TIBC/Ferritin/ %Sat No results found for: IRON, TIBC, FERRITIN, IRONPCTSAT Lipid Panel     Component Value Date/Time   CHOL 163 11/06/2017 0850   TRIG 130 11/06/2017 0850   HDL 36 (L) 11/06/2017 0850   LDLCALC 101 (H) 11/06/2017 0850   Hepatic Function Panel     Component Value Date/Time   PROT 6.6 11/06/2017 0850   ALBUMIN 3.9 11/06/2017 0850   AST 16 11/06/2017 0850   ALT 22 11/06/2017 0850   ALKPHOS 67 11/06/2017 0850   BILITOT 0.3 11/06/2017 0850   BILIDIR 0.1 11/09/2010 1645   IBILI 0.4 11/09/2010 1645      Component Value Date/Time   TSH 1.890 09/27/2016 1342    ASSESSMENT AND PLAN: Elevated creatine kinase  Class 3 severe obesity with serious comorbidity and body mass index (BMI) of 40.0 to 44.9 in adult,  unspecified obesity type ([place])  PLAN:  Elevated Creatinine Arnelle is encouraged to keep up with her water intake and blood pressure medicine. Khira agrees to follow up with our clinic in 3 weeks.  We spent > than 50% of the 15 minute visit on the counseling as documented in the note.  Obesity Kerianna is currently in the action stage of change. As such, her goal is to continue  with weight loss efforts She has agreed to follow the Category 2 plan Lamiyah has been instructed to work up to a goal of 150 minutes of combined cardio and strengthening exercise per week for weight loss and overall health benefits. We discussed the following Behavioral Modification Strategies today: increasing lean protein intake and planning for success   Mehek has agreed to follow up with our clinic in 3 weeks. She was informed of the importance of frequent follow up visits to maximize her success with intensive lifestyle modifications for her multiple health conditions.   Wilhemena Durie, am acting as transcriptionist for Lacy Duverney, PA-C I, Lacy Duverney [facility], have reviewed this note and agree with its content.

## 2017-12-18 ENCOUNTER — Ambulatory Visit (INDEPENDENT_AMBULATORY_CARE_PROVIDER_SITE_OTHER): Payer: 59 | Admitting: Physician Assistant

## 2017-12-18 VITALS — BP 121/83 | HR 61 | Temp 97.7°F | Ht 66.0 in | Wt 256.0 lb

## 2017-12-18 DIAGNOSIS — E66813 Obesity, class 3: Secondary | ICD-10-CM

## 2017-12-18 DIAGNOSIS — R7303 Prediabetes: Secondary | ICD-10-CM | POA: Diagnosis not present

## 2017-12-18 DIAGNOSIS — Z6841 Body Mass Index (BMI) 40.0 and over, adult: Secondary | ICD-10-CM

## 2017-12-18 DIAGNOSIS — I10 Essential (primary) hypertension: Secondary | ICD-10-CM | POA: Diagnosis not present

## 2017-12-18 DIAGNOSIS — E7849 Other hyperlipidemia: Secondary | ICD-10-CM | POA: Diagnosis not present

## 2017-12-18 DIAGNOSIS — E559 Vitamin D deficiency, unspecified: Secondary | ICD-10-CM | POA: Diagnosis not present

## 2017-12-18 DIAGNOSIS — Z9189 Other specified personal risk factors, not elsewhere classified: Secondary | ICD-10-CM

## 2017-12-18 MED ORDER — VITAMIN D (ERGOCALCIFEROL) 1.25 MG (50000 UNIT) PO CAPS: 50000.0000 [IU] | ORAL_CAPSULE | ORAL | 0 refills | Status: DC

## 2017-12-18 MED ORDER — HYDROCHLOROTHIAZIDE 12.5 MG PO TABS: 12.5000 mg | ORAL_TABLET | Freq: Every day | ORAL | 0 refills | Status: DC

## 2017-12-18 MED ORDER — METFORMIN HCL 500 MG PO TABS: 500.0000 mg | ORAL_TABLET | Freq: Every day | ORAL | 0 refills | Status: DC

## 2017-12-18 MED ORDER — ATORVASTATIN CALCIUM 10 MG PO TABS: 10.0000 mg | ORAL_TABLET | Freq: Every day | ORAL | 0 refills | Status: DC

## 2017-12-18 MED FILL — HYDROCHLOROTHIAZIDE 12.5 MG: 12.5 | 30 days supply | Qty: 30 | Fill #0

## 2017-12-18 MED FILL — ATORVASTATIN 10 MG TABLET: 10 | 30 days supply | Qty: 30 | Fill #0

## 2017-12-18 MED FILL — metFORMIN HCL 500 MG TABS: 500 | 30 days supply | Qty: 30 | Fill #0

## 2017-12-18 MED FILL — VIT D2 1.25 MG (50,000 UNIT: 1.25 MG | 28 days supply | Qty: 4 | Fill #0

## 2017-12-18 NOTE — Progress Notes (Signed)
Office: [phone]  /  Fax: [phone]   HPI:   Chief Complaint: OBESITY Leslie Wright is here to discuss her progress with her obesity treatment plan. She is on the Category 2 plan and is following her eating plan approximately 65 % of the time. She states she is exercising 0 minutes 0 times per week. Velva continues to do well with weight loss. She is back to keeping healthy foods in the home. She states it made it easier to follow the plan.  Her weight is 256 lb (116.1 kg) today and has had a weight loss of 2 pounds over a period of 3 weeks since her last visit. She has lost 31 lbs since starting treatment with [place].  Pre-Diabetes Leslie Wright has a diagnosis of pre-diabetes based on her elevated Hgb A1c and was informed this puts her at greater risk of developing diabetes. She is taking metformin currently and continues to work on diet and exercise to decrease risk of diabetes. She denies polyphagia, nausea or hypoglycemia.  Hypertension Leslie Wright is a 52 y.o. female with hypertension. Leslie Wright's blood pressure is stable and she denies chest pain or shortness of breath. She is working weight loss to help control her blood pressure with the goal of decreasing her risk of heart attack and stroke. Maresha's blood pressure is currently controlled.  Hyperlipidemia Leslie Wright has hyperlipidemia and has been trying to improve her cholesterol levels with intensive lifestyle modification including a low saturated fat diet, exercise and weight loss. She is on statin and she denies any chest pain, claudication or myalgias.  At risk for cardiovascular disease Leslie Wright is at a higher than average risk for cardiovascular disease due to obesity, hypertension, and hyperlipidemia. She currently denies any chest pain.  Vitamin D Deficiency Leslie Wright has a diagnosis of vitamin D deficiency. She is currently taking prescription Vit D and denies nausea, vomiting or muscle weakness.  ALLERGIES: Not on  File  MEDICATIONS: Current Outpatient Medications on File Prior to Visit  Medication Sig Dispense Refill  . acetaminophen (TYLENOL) 650 MG CR tablet Take 650 mg by mouth every 8 (eight) hours as needed for pain.    Marland Kitchen ibuprofen (ADVIL,MOTRIN) 200 MG tablet Take 200 mg by mouth 2 (two) times daily.     . Multiple Vitamin (MULTIVITAMIN) tablet Take 1 tablet by mouth daily.     No current facility-administered medications on file prior to visit.     PAST MEDICAL HISTORY: Past Medical History:  Diagnosis Date  . Acid reflux   . Anemia   . Back pain   . Gallbladder problem   . High blood pressure   . Hip pain   . Pre-diabetes   . Shortness of breath   . Sleep apnea   . Swelling     PAST SURGICAL HISTORY: Past Surgical History:  Procedure Laterality Date  . CARPAL TUNNEL RELEASE    . GALLBLADDER SURGERY     April 2012  . LASIK     2000    SOCIAL HISTORY: Social History   Tobacco Use  . Smoking status: Never Smoker  . Smokeless tobacco: Never Used  Substance Use Topics  . Alcohol use: Not on file  . Drug use: Not on file    FAMILY HISTORY: Family History  Problem Relation Age of Onset  . Diabetes Mother   . Thyroid disease Mother   . Liver disease Mother   . Obesity Mother   . Hypertension Father   . Hyperlipidemia Father   .  Heart disease Father   . Stroke Father   . Obesity Father     ROS: Review of Systems  Constitutional: Positive for weight loss.  Respiratory: Negative for shortness of breath.   Cardiovascular: Negative for chest pain and claudication.  Gastrointestinal: Negative for nausea and vomiting.  Musculoskeletal: Negative for myalgias.       Negative muscle weakness  Endo/Heme/Allergies:       Negative polyphagia Negative hypoglycemia    PHYSICAL EXAM: Blood pressure 121/83, pulse 61, temperature 97.7 F (36.5 C), temperature source Oral, height 5\' 6"  (1.676 m), weight 256 lb (116.1 kg), SpO2 98 %. Body mass index is 41.32  kg/m. Physical Exam  Constitutional: She is oriented to person, place, and time. She appears well-developed and well-nourished.  Cardiovascular: Normal rate.  Pulmonary/Chest: Effort normal.  Musculoskeletal: Normal range of motion.  Neurological: She is oriented to person, place, and time.  Skin: Skin is warm and dry.  Psychiatric: She has a normal mood and affect. Her behavior is normal.  Vitals reviewed.   RECENT LABS AND TESTS: BMET    Component Value Date/Time   NA 141 11/06/2017 0850   K 4.7 11/06/2017 0850   CL 104 11/06/2017 0850   CO2 22 11/06/2017 0850   GLUCOSE 89 11/06/2017 0850   GLUCOSE 94 12/28/2010 1530   BUN 15 11/06/2017 0850   CREATININE 1.07 (H) 11/06/2017 0850   CALCIUM 9.3 11/06/2017 0850   GFRNONAA 60 11/06/2017 0850   GFRAA 69 11/06/2017 0850   Lab Results  Component Value Date   HGBA1C 5.6 11/06/2017   HGBA1C 5.6 07/15/2017   HGBA1C 5.5 04/11/2017   HGBA1C 5.6 12/25/2016   HGBA1C 5.5 09/27/2016   Lab Results  Component Value Date   INSULIN 20.3 11/06/2017   INSULIN 22.2 07/15/2017   INSULIN 20.4 04/11/2017   INSULIN 16.9 12/25/2016   INSULIN 20.0 09/27/2016   CBC    Component Value Date/Time   WBC 8.3 09/27/2016 1342   WBC 9.1 12/28/2010 1530   RBC 4.93 09/27/2016 1342   RBC 4.66 12/28/2010 1530   HGB 13.4 09/27/2016 1342   HCT 40.9 09/27/2016 1342   PLT 193 12/28/2010 1530   MCV 83 09/27/2016 1342   MCH 27.2 09/27/2016 1342   MCH 24.9 (L) 12/28/2010 1530   MCHC 32.8 09/27/2016 1342   MCHC 31.8 12/28/2010 1530   RDW 15.9 (H) 09/27/2016 1342   LYMPHSABS 2.3 09/27/2016 1342   MONOABS 1.1 (H) 11/10/2010 0554   EOSABS 0.6 (H) 09/27/2016 1342   BASOSABS 0.0 09/27/2016 1342   Iron/TIBC/Ferritin/ %Sat No results found for: IRON, TIBC, FERRITIN, IRONPCTSAT Lipid Panel     Component Value Date/Time   CHOL 163 11/06/2017 0850   TRIG 130 11/06/2017 0850   HDL 36 (L) 11/06/2017 0850   LDLCALC 101 (H) 11/06/2017 0850   Hepatic  Function Panel     Component Value Date/Time   PROT 6.6 11/06/2017 0850   ALBUMIN 3.9 11/06/2017 0850   AST 16 11/06/2017 0850   ALT 22 11/06/2017 0850   ALKPHOS 67 11/06/2017 0850   BILITOT 0.3 11/06/2017 0850   BILIDIR 0.1 11/09/2010 1645   IBILI 0.4 11/09/2010 1645      Component Value Date/Time   TSH 1.890 09/27/2016 1342  Results for LANISE, MERGEN "KAY" (MRN [id number]) as of 12/18/2017 12:58  Ref. Range 11/06/2017 08:50  Vitamin D, 25-Hydroxy Latest Ref Range: 30.0 - 100.0 ng/mL 29.8 (L)    ASSESSMENT AND PLAN: Prediabetes -  Plan: metFORMIN (GLUCOPHAGE) 500 MG tablet  Essential hypertension - Plan: hydrochlorothiazide (HYDRODIURIL) 12.5 MG tablet  Other hyperlipidemia - Plan: atorvastatin (LIPITOR) 10 MG tablet  Vitamin D deficiency - Plan: Vitamin D, Ergocalciferol, (DRISDOL) 50000 units CAPS capsule  At risk for heart disease  Class 3 severe obesity with serious comorbidity and body mass index (BMI) of 40.0 to 44.9 in adult, unspecified obesity type ([place])  PLAN:  Pre-Diabetes Leslie Wright will continue to work on weight loss, exercise, and decreasing simple carbohydrates in her diet to help decrease the risk of diabetes. We dicussed metformin including benefits and risks. She was informed that eating too many simple carbohydrates or too many calories at one sitting increases the likelihood of GI side effects. Leslie Wright agrees to continue taking metformin 500 mg q AM #30 and we will refill for 1 month. Leslie Wright agrees to follow up with our clinic in 3 weeks as directed to monitor her progress.  Hypertension We discussed sodium restriction, working on healthy weight loss, and a regular exercise program as the means to achieve improved blood pressure control. Leslie Wright agreed with this plan and agreed to follow up as directed. We will continue to monitor her blood pressure as well as her progress with the above lifestyle modifications. Leslie Wright agrees to continue taking hydrochlorothiazide  12.5 mg qd #30 and we will refill for 1 month and she will watch for signs of hypotension as she continues her lifestyle modifications. Leslie Wright agrees to follow up with our clinic in 3 weeks.  Hyperlipidemia Leslie Wright was informed of the American Heart Association Guidelines emphasizing intensive lifestyle modifications as the first line treatment for hyperlipidemia. We discussed many lifestyle modifications today in depth, and Shinika will continue to work on decreasing saturated fats such as fatty red meat, butter and many fried foods. She will also increase vegetables and lean protein in her diet and continue to work on exercise and weight loss efforts. Leslie Wright agrees to continue taking atorvastatin 10 mg qd #30 and we will refill for 1 month. Leslie Wright agrees to follow up with our clinic in 3 weeks.  Cardiovascular risk counselling Leslie Wright was given extended (15 minutes) coronary artery disease prevention counseling today. She is 52 y.o. female and has risk factors for heart disease including obesity, hypertension, and hyperlipidemia. We discussed intensive lifestyle modifications today with an emphasis on specific weight loss instructions and strategies. Pt was also informed of the importance of increasing exercise and decreasing saturated fats to help prevent heart disease.  Vitamin D Deficiency Leslie Wright was informed that low vitamin D levels contributes to fatigue and are associated with obesity, breast, and colon cancer. Leslie Wright agrees to continue taking prescription Vit D @50 ,000 IU every week #4 and we will refill for 1 month. She will follow up for routine testing of vitamin D, at least 2-3 times per year. She was informed of the risk of over-replacement of vitamin D and agrees to not increase her dose unless she discusses this with [place] first. Leslie Wright agrees to follow up with our clinic in 3 weeks.  Obesity Leslie Wright is currently in the action stage of change. As such, her goal is to continue with weight loss  efforts She has agreed to follow the Category 2 plan Leslie Wright has been instructed to work up to a goal of 150 minutes of combined cardio and strengthening exercise per week for weight loss and overall health benefits. We discussed the following Behavioral Modification Strategies today: increasing lean protein intake and work on meal planning and  easy cooking plans   Leslie Wright has agreed to follow up with our clinic in 3 weeks. She was informed of the importance of frequent follow up visits to maximize her success with intensive lifestyle modifications for her multiple health conditions.   Wilhemena Durie, am acting as transcriptionist for Lacy Duverney, PA-C I, Lacy Duverney [facility], have reviewed this note and agree with its content

## 2018-01-08 ENCOUNTER — Ambulatory Visit (INDEPENDENT_AMBULATORY_CARE_PROVIDER_SITE_OTHER): Payer: 59 | Admitting: Physician Assistant

## 2018-01-08 VITALS — BP 154/83 | HR 67 | Temp 97.8°F | Ht 66.0 in | Wt 263.0 lb

## 2018-01-08 DIAGNOSIS — E7849 Other hyperlipidemia: Secondary | ICD-10-CM | POA: Diagnosis not present

## 2018-01-08 DIAGNOSIS — E559 Vitamin D deficiency, unspecified: Secondary | ICD-10-CM

## 2018-01-08 DIAGNOSIS — Z9189 Other specified personal risk factors, not elsewhere classified: Secondary | ICD-10-CM | POA: Diagnosis not present

## 2018-01-08 DIAGNOSIS — Z6841 Body Mass Index (BMI) 40.0 and over, adult: Secondary | ICD-10-CM | POA: Diagnosis not present

## 2018-01-08 DIAGNOSIS — R7303 Prediabetes: Secondary | ICD-10-CM | POA: Diagnosis not present

## 2018-01-08 DIAGNOSIS — I10 Essential (primary) hypertension: Secondary | ICD-10-CM | POA: Diagnosis not present

## 2018-01-08 MED ORDER — ATORVASTATIN CALCIUM 10 MG PO TABS: 10.0000 mg | ORAL_TABLET | Freq: Every day | ORAL | 0 refills | Status: DC

## 2018-01-08 MED ORDER — METFORMIN HCL 500 MG PO TABS: 500.0000 mg | ORAL_TABLET | Freq: Every day | ORAL | 0 refills | Status: DC

## 2018-01-08 MED ORDER — HYDROCHLOROTHIAZIDE 12.5 MG PO TABS: 12.5000 mg | ORAL_TABLET | Freq: Every day | ORAL | 0 refills | Status: DC

## 2018-01-08 MED ORDER — VITAMIN D (ERGOCALCIFEROL) 1.25 MG (50000 UNIT) PO CAPS: 50000.0000 [IU] | ORAL_CAPSULE | ORAL | 0 refills | Status: DC

## 2018-01-08 NOTE — Progress Notes (Signed)
Office: [phone]  /  Fax: [phone]   HPI:   Chief Complaint: OBESITY Leslie Wright is here to discuss her progress with her obesity treatment plan. She is on the Category 2 plan and is following her eating plan approximately 50 % of the time. She states she is exercising 0 minutes 0 times per week. Leslie Wright is retaining fluids. She also was celebrating her birthday and has not been as mindful of her eating. She is motivated to get back on track and continue with weight loss. Her weight is 263 lb (119.3 kg) today and has had a weight loss of 7 pounds over a period of 3 weeks since her last visit. She has lost 24 lbs since starting treatment with [place].  Pre-Diabetes Leslie Wright has a diagnosis of pre-diabetes based on her elevated Hgb A1c and was informed this puts her at greater risk of developing diabetes. She is taking metformin currently and continues to work on diet and exercise to decrease risk of diabetes. She denies nausea, polyphagia or hypoglycemia.  Vitamin D deficiency Leslie Wright has a diagnosis of vitamin D deficiency. She is currently taking vit D and denies nausea, vomiting or muscle weakness.  Hypertension Leslie Wright is a 52 y.o. female with hypertension. Her blood pressure is elevated. She is retaining fluids and has missed some of her diuretic doses. Leslie Wright denies chest pain or shortness of breath on exertion. She is working weight loss to help control her blood pressure with the goal of decreasing her risk of heart attack and stroke. Leslie Wright blood pressure is not currently controlled.  Hyperlipidemia Leslie Wright has hyperlipidemia and has been trying to improve her cholesterol levels with intensive lifestyle modification including a low saturated fat diet, exercise and weight loss. She denies any chest pain or claudication.  At risk for cardiovascular disease Leslie Wright is at a higher than average risk for cardiovascular disease due to obesity, hypertension and hyperlipidemia. She  currently denies any chest pain.  ALLERGIES: No Known Allergies  MEDICATIONS: Current Outpatient Medications on File Prior to Visit  Medication Sig Dispense Refill  . acetaminophen (TYLENOL) 650 MG CR tablet Take 650 mg by mouth every 8 (eight) hours as needed for pain.    Marland Kitchen ibuprofen (ADVIL,MOTRIN) 200 MG tablet Take 200 mg by mouth 2 (two) times daily.     . Multiple Vitamin (MULTIVITAMIN) tablet Take 1 tablet by mouth daily.     No current facility-administered medications on file prior to visit.     PAST MEDICAL HISTORY: Past Medical History:  Diagnosis Date  . Acid reflux   . Anemia   . Back pain   . Gallbladder problem   . High blood pressure   . Hip pain   . Pre-diabetes   . Shortness of breath   . Sleep apnea   . Swelling     PAST SURGICAL HISTORY: Past Surgical History:  Procedure Laterality Date  . CARPAL TUNNEL RELEASE    . GALLBLADDER SURGERY     April 2012  . LASIK     2000    SOCIAL HISTORY: Social History   Tobacco Use  . Smoking status: Never Smoker  . Smokeless tobacco: Never Used  Substance Use Topics  . Alcohol use: Not on file  . Drug use: Not on file    FAMILY HISTORY: Family History  Problem Relation Age of Onset  . Diabetes Mother   . Thyroid disease Mother   . Liver disease Mother   . Obesity Mother   .  Hypertension Father   . Hyperlipidemia Father   . Heart disease Father   . Stroke Father   . Obesity Father     ROS: Review of Systems  Constitutional: Negative for weight loss.  Respiratory: Negative for shortness of breath (on exertion).   Cardiovascular: Negative for chest pain and claudication.  Gastrointestinal: Negative for nausea and vomiting.  Musculoskeletal:       Negative for muscle weakness  Endo/Heme/Allergies:       Negative for polyphagia Negative for hypoglycemia    PHYSICAL EXAM: Blood pressure (!) 154/83, pulse 67, temperature 97.8 F (36.6 C), temperature source Oral, height 5\' 6"  (1.676 m),  weight 263 lb (119.3 kg), SpO2 98 %. Body mass index is 42.45 kg/m. Physical Exam  Constitutional: She is oriented to person, place, and time. She appears well-developed and well-nourished.  Cardiovascular: Normal rate.  Pulmonary/Chest: Effort normal.  Musculoskeletal: Normal range of motion.  Neurological: She is oriented to person, place, and time.  Skin: Skin is warm and dry.  Psychiatric: She has a normal mood and affect. Her behavior is normal.  Vitals reviewed.   RECENT LABS AND TESTS: BMET    Component Value Date/Time   NA 141 11/06/2017 0850   K 4.7 11/06/2017 0850   CL 104 11/06/2017 0850   CO2 22 11/06/2017 0850   GLUCOSE 89 11/06/2017 0850   GLUCOSE 94 12/28/2010 1530   BUN 15 11/06/2017 0850   CREATININE 1.07 (H) 11/06/2017 0850   CALCIUM 9.3 11/06/2017 0850   GFRNONAA 60 11/06/2017 0850   GFRAA 69 11/06/2017 0850   Lab Results  Component Value Date   HGBA1C 5.6 11/06/2017   HGBA1C 5.6 07/15/2017   HGBA1C 5.5 04/11/2017   HGBA1C 5.6 12/25/2016   HGBA1C 5.5 09/27/2016   Lab Results  Component Value Date   INSULIN 20.3 11/06/2017   INSULIN 22.2 07/15/2017   INSULIN 20.4 04/11/2017   INSULIN 16.9 12/25/2016   INSULIN 20.0 09/27/2016   CBC    Component Value Date/Time   WBC 8.3 09/27/2016 1342   WBC 9.1 12/28/2010 1530   RBC 4.93 09/27/2016 1342   RBC 4.66 12/28/2010 1530   HGB 13.4 09/27/2016 1342   HCT 40.9 09/27/2016 1342   PLT 193 12/28/2010 1530   MCV 83 09/27/2016 1342   MCH 27.2 09/27/2016 1342   MCH 24.9 (L) 12/28/2010 1530   MCHC 32.8 09/27/2016 1342   MCHC 31.8 12/28/2010 1530   RDW 15.9 (H) 09/27/2016 1342   LYMPHSABS 2.3 09/27/2016 1342   MONOABS 1.1 (H) 11/10/2010 0554   EOSABS 0.6 (H) 09/27/2016 1342   BASOSABS 0.0 09/27/2016 1342   Iron/TIBC/Ferritin/ %Sat No results found for: IRON, TIBC, FERRITIN, IRONPCTSAT Lipid Panel     Component Value Date/Time   CHOL 163 11/06/2017 0850   TRIG 130 11/06/2017 0850   HDL 36 (L)  11/06/2017 0850   LDLCALC 101 (H) 11/06/2017 0850   Hepatic Function Panel     Component Value Date/Time   PROT 6.6 11/06/2017 0850   ALBUMIN 3.9 11/06/2017 0850   AST 16 11/06/2017 0850   ALT 22 11/06/2017 0850   ALKPHOS 67 11/06/2017 0850   BILITOT 0.3 11/06/2017 0850   BILIDIR 0.1 11/09/2010 1645   IBILI 0.4 11/09/2010 1645      Component Value Date/Time   TSH 1.890 09/27/2016 1342   Results for JANIN, KOZLOWSKI "KAY" (MRN [id number]) as of 01/08/2018 14:45  Ref. Range 11/06/2017 08:50  Vitamin D, 25-Hydroxy Latest Ref Range:  30.0 - 100.0 ng/mL 29.8 (L)   ASSESSMENT AND PLAN: Essential hypertension - Plan: hydrochlorothiazide (HYDRODIURIL) 12.5 MG tablet  Other hyperlipidemia - Plan: atorvastatin (LIPITOR) 10 MG tablet  Prediabetes - Plan: metFORMIN (GLUCOPHAGE) 500 MG tablet  Vitamin D deficiency - Plan: Vitamin D, Ergocalciferol, (DRISDOL) 50000 units CAPS capsule  At risk for heart disease  Class 3 severe obesity with serious comorbidity and body mass index (BMI) of 40.0 to 44.9 in adult, unspecified obesity type ([place])  PLAN:  Pre-Diabetes Kyiah will continue to work on weight loss, exercise, and decreasing simple carbohydrates in her diet to help decrease the risk of diabetes. We dicussed metformin including benefits and risks. She was informed that eating too many simple carbohydrates or too many calories at one sitting increases the likelihood of GI side effects. Tyffany agrees to continue metformin for now and a prescription was written today for 1 month refill. Khloe agreed to follow up with [place] as directed to monitor her progress.  Vitamin D Deficiency Finnleigh was informed that low vitamin D levels contributes to fatigue and are associated with obesity, breast, and colon cancer. She agrees to continue to take prescription Vit D @50 ,000 IU every week #4 with no refills and will follow up for routine testing of vitamin D, at least 2-3 times per year. She was informed of  the risk of over-replacement of vitamin D and agrees to not increase her dose unless she discusses this with [place] first. Nielle agrees to follow up with our clinic in 2 weeks.  Hypertension We discussed sodium restriction, working on healthy weight loss, and a regular exercise program as the means to achieve improved blood pressure control. Jozlyn agreed with this plan and agreed to follow up as directed. We will continue to monitor her blood pressure as well as her progress with the above lifestyle modifications. She agrees to continue HCTZ 2.5 mg qd #30 with no refills and will watch for signs of hypotension as she continues her lifestyle modifications.  Hyperlipidemia Jeanifer was informed of the American Heart Association Guidelines emphasizing intensive lifestyle modifications as the first line treatment for hyperlipidemia. We discussed many lifestyle modifications today in depth, and Pat will continue to work on decreasing saturated fats such as fatty red meat, butter and many fried foods. She will also increase vegetables and lean protein in her diet and continue to work on exercise and weight loss efforts. She agrees to continue atorvastatin 10 mg qd #30 with no refills and follow up as directed.  Cardiovascular risk counseling Lenzi was given extended (15 minutes) coronary artery disease prevention counseling today. She is 52 y.o. female and has risk factors for heart disease including obesity, hypertension and hyperlipidemia. We discussed intensive lifestyle modifications today with an emphasis on specific weight loss instructions and strategies. Pt was also informed of the importance of increasing exercise and decreasing saturated fats to help prevent heart disease.  Obesity Poetry is currently in the action stage of change. As such, her goal is to continue with weight loss efforts She has agreed to follow the Category 2 plan Avielle has been instructed to work up to a goal of 150 minutes of  combined cardio and strengthening exercise per week for weight loss and overall health benefits. We discussed the following Behavioral Modification Strategies today: increasing lean protein intake and decrease junk food  Jazsmin has agreed to follow up with our clinic in 2 weeks. She was informed of the importance of frequent follow up visits to  maximize her success with intensive lifestyle modifications for her multiple health conditions.   Corey Skains, am acting as transcriptionist for [employer], PA-C I, Leslie Wright [facility], have reviewed this note and agree with its content

## 2018-01-22 ENCOUNTER — Ambulatory Visit (INDEPENDENT_AMBULATORY_CARE_PROVIDER_SITE_OTHER): Payer: 59 | Admitting: Physician Assistant

## 2018-01-22 VITALS — BP 136/87 | HR 69 | Temp 97.7°F | Ht 66.0 in | Wt 259.0 lb

## 2018-01-22 DIAGNOSIS — I10 Essential (primary) hypertension: Secondary | ICD-10-CM

## 2018-01-22 DIAGNOSIS — Z6841 Body Mass Index (BMI) 40.0 and over, adult: Secondary | ICD-10-CM | POA: Diagnosis not present

## 2018-01-23 NOTE — Progress Notes (Signed)
Office: [phone]  /  Fax: [phone]   HPI:   Chief Complaint: OBESITY Leslie Wright is here to discuss her progress with her obesity treatment plan. She is on the Category 2 plan and is following her eating plan approximately 40 % of the time. She states she is exercising 0 minutes 0 times per week. Leslie Wright continues to do well with weight loss. She is mindful of her eating and states her hunger is well controlled.  Her weight is 259 lb (117.5 kg) today and has had a weight loss of 4 pounds over a period of 2 weeks since her last visit. She has lost 28 lbs since starting treatment with [place].  Hypertension Leslie Wright is a 52 y.o. female with hypertension. Leslie Wright's blood pressure is stable. She denies chest pain or shortness of breath. She is working weight loss to help control her blood pressure with the goal of decreasing her risk of heart attack and stroke. Leslie Wright's blood pressure is currently controlled.  ALLERGIES: No Known Allergies  MEDICATIONS: Current Outpatient Medications on File Prior to Visit  Medication Sig Dispense Refill  . acetaminophen (TYLENOL) 650 MG CR tablet Take 650 mg by mouth every 8 (eight) hours as needed for pain.    Marland Kitchen atorvastatin (LIPITOR) 10 MG tablet Take 1 tablet (10 mg total) by mouth daily. 30 tablet 0  . hydrochlorothiazide (HYDRODIURIL) 12.5 MG tablet Take 1 tablet (12.5 mg total) by mouth daily. 30 tablet 0  . ibuprofen (ADVIL,MOTRIN) 200 MG tablet Take 200 mg by mouth 2 (two) times daily.     . metFORMIN (GLUCOPHAGE) 500 MG tablet Take 1 tablet (500 mg total) by mouth daily with breakfast. 30 tablet 0  . Multiple Vitamin (MULTIVITAMIN) tablet Take 1 tablet by mouth daily.    . Vitamin D, Ergocalciferol, (DRISDOL) 50000 units CAPS capsule Take 1 capsule (50,000 Units total) by mouth every 7 (seven) days. 4 capsule 0   No current facility-administered medications on file prior to visit.     PAST MEDICAL HISTORY: Past Medical History:  Diagnosis  Date  . Acid reflux   . Anemia   . Back pain   . Gallbladder problem   . High blood pressure   . Hip pain   . Pre-diabetes   . Shortness of breath   . Sleep apnea   . Swelling     PAST SURGICAL HISTORY: Past Surgical History:  Procedure Laterality Date  . CARPAL TUNNEL RELEASE    . GALLBLADDER SURGERY     April 2012  . LASIK     2000    SOCIAL HISTORY: Social History   Tobacco Use  . Smoking status: Never Smoker  . Smokeless tobacco: Never Used  Substance Use Topics  . Alcohol use: Not on file  . Drug use: Not on file    FAMILY HISTORY: Family History  Problem Relation Age of Onset  . Diabetes Mother   . Thyroid disease Mother   . Liver disease Mother   . Obesity Mother   . Hypertension Father   . Hyperlipidemia Father   . Heart disease Father   . Stroke Father   . Obesity Father     ROS: Review of Systems  Constitutional: Positive for weight loss.  Respiratory: Negative for shortness of breath.   Cardiovascular: Negative for chest pain.    PHYSICAL EXAM: Blood pressure 136/87, pulse 69, temperature 97.7 F (36.5 C), temperature source Oral, height 5\' 6"  (1.676 m), weight 259 lb (117.5 kg),  SpO2 97 %. Body mass index is 41.8 kg/m. Physical Exam  Constitutional: She is oriented to person, place, and time. She appears well-developed and well-nourished.  Cardiovascular: Normal rate.  Pulmonary/Chest: Effort normal.  Musculoskeletal: Normal range of motion.  Neurological: She is oriented to person, place, and time.  Skin: Skin is warm and dry.  Psychiatric: She has a normal mood and affect. Her behavior is normal.  Vitals reviewed.   RECENT LABS AND TESTS: BMET    Component Value Date/Time   NA 141 11/06/2017 0850   K 4.7 11/06/2017 0850   CL 104 11/06/2017 0850   CO2 22 11/06/2017 0850   GLUCOSE 89 11/06/2017 0850   GLUCOSE 94 12/28/2010 1530   BUN 15 11/06/2017 0850   CREATININE 1.07 (H) 11/06/2017 0850   CALCIUM 9.3 11/06/2017 0850    GFRNONAA 60 11/06/2017 0850   GFRAA 69 11/06/2017 0850   Lab Results  Component Value Date   HGBA1C 5.6 11/06/2017   HGBA1C 5.6 07/15/2017   HGBA1C 5.5 04/11/2017   HGBA1C 5.6 12/25/2016   HGBA1C 5.5 09/27/2016   Lab Results  Component Value Date   INSULIN 20.3 11/06/2017   INSULIN 22.2 07/15/2017   INSULIN 20.4 04/11/2017   INSULIN 16.9 12/25/2016   INSULIN 20.0 09/27/2016   CBC    Component Value Date/Time   WBC 8.3 09/27/2016 1342   WBC 9.1 12/28/2010 1530   RBC 4.93 09/27/2016 1342   RBC 4.66 12/28/2010 1530   HGB 13.4 09/27/2016 1342   HCT 40.9 09/27/2016 1342   PLT 193 12/28/2010 1530   MCV 83 09/27/2016 1342   MCH 27.2 09/27/2016 1342   MCH 24.9 (L) 12/28/2010 1530   MCHC 32.8 09/27/2016 1342   MCHC 31.8 12/28/2010 1530   RDW 15.9 (H) 09/27/2016 1342   LYMPHSABS 2.3 09/27/2016 1342   MONOABS 1.1 (H) 11/10/2010 0554   EOSABS 0.6 (H) 09/27/2016 1342   BASOSABS 0.0 09/27/2016 1342   Iron/TIBC/Ferritin/ %Sat No results found for: IRON, TIBC, FERRITIN, IRONPCTSAT Lipid Panel     Component Value Date/Time   CHOL 163 11/06/2017 0850   TRIG 130 11/06/2017 0850   HDL 36 (L) 11/06/2017 0850   LDLCALC 101 (H) 11/06/2017 0850   Hepatic Function Panel     Component Value Date/Time   PROT 6.6 11/06/2017 0850   ALBUMIN 3.9 11/06/2017 0850   AST 16 11/06/2017 0850   ALT 22 11/06/2017 0850   ALKPHOS 67 11/06/2017 0850   BILITOT 0.3 11/06/2017 0850   BILIDIR 0.1 11/09/2010 1645   IBILI 0.4 11/09/2010 1645      Component Value Date/Time   TSH 1.890 09/27/2016 1342    ASSESSMENT AND PLAN: Essential hypertension  Class 3 severe obesity with serious comorbidity and body mass index (BMI) of 40.0 to 44.9 in adult, unspecified obesity type (HCC)  PLAN:  Hypertension We discussed sodium restriction, working on healthy weight loss, and a regular exercise program as the means to achieve improved blood pressure control. Leslie Wright agreed with this plan and agreed to  follow up as directed. We will continue to monitor her blood pressure as well as her progress with the above lifestyle modifications. She will continue her medications as prescribed and will watch for signs of hypotension as she continues her lifestyle modifications. Leslie Wright agrees to follow up with our clinic in 2 to 3 weeks.  We spent > than 50% of the 15 minute visit on the counseling as documented in the note.  Obesity Leslie Wright  is currently in the action stage of change. As such, her goal is to continue with weight loss efforts She has agreed to follow the Category 2 plan Leslie Wright has been instructed to work up to a goal of 150 minutes of combined cardio and strengthening exercise per week for weight loss and overall health benefits. We discussed the following Behavioral Modification Strategies today: increasing lean protein intake and work on meal planning and easy cooking plans   Kirat has agreed to follow up with our clinic in 2 to 3 weeks. She was informed of the importance of frequent follow up visits to maximize her success with intensive lifestyle modifications for her multiple health conditions.   Wilhemena Durie, am acting as transcriptionist for Lacy Duverney, PA-C I, Lacy Duverney [facility], have reviewed this note and agree with its content

## 2018-02-12 ENCOUNTER — Ambulatory Visit (INDEPENDENT_AMBULATORY_CARE_PROVIDER_SITE_OTHER): Payer: 59 | Admitting: Physician Assistant

## 2018-02-12 VITALS — BP 134/85 | HR 54 | Temp 97.6°F | Ht 66.0 in | Wt 264.0 lb

## 2018-02-12 DIAGNOSIS — Z6841 Body Mass Index (BMI) 40.0 and over, adult: Secondary | ICD-10-CM | POA: Diagnosis not present

## 2018-02-12 DIAGNOSIS — R7303 Prediabetes: Secondary | ICD-10-CM | POA: Diagnosis not present

## 2018-02-12 DIAGNOSIS — I10 Essential (primary) hypertension: Secondary | ICD-10-CM

## 2018-02-12 DIAGNOSIS — E559 Vitamin D deficiency, unspecified: Secondary | ICD-10-CM | POA: Diagnosis not present

## 2018-02-12 DIAGNOSIS — Z9189 Other specified personal risk factors, not elsewhere classified: Secondary | ICD-10-CM

## 2018-02-12 DIAGNOSIS — E7849 Other hyperlipidemia: Secondary | ICD-10-CM

## 2018-02-12 MED ORDER — VITAMIN D (ERGOCALCIFEROL) 1.25 MG (50000 UNIT) PO CAPS
50000.0000 [IU] | ORAL_CAPSULE | ORAL | 0 refills | Status: DC
Start: 2018-02-12 — End: 2018-03-18

## 2018-02-12 MED ORDER — ATORVASTATIN CALCIUM 10 MG PO TABS: 10.0000 mg | ORAL_TABLET | Freq: Every day | ORAL | 0 refills | Status: DC

## 2018-02-12 MED ORDER — METFORMIN HCL 500 MG PO TABS: 500.0000 mg | ORAL_TABLET | Freq: Every day | ORAL | 0 refills | Status: DC

## 2018-02-12 MED ORDER — HYDROCHLOROTHIAZIDE 12.5 MG PO TABS: 12.5000 mg | ORAL_TABLET | Freq: Every day | ORAL | 0 refills | Status: DC

## 2018-02-12 MED FILL — ATORVASTATIN 10 MG TABLET: 10 | 30 days supply | Qty: 30 | Fill #0

## 2018-02-12 MED FILL — VIT D2 1.25 MG (50,000 UNIT: 1.25 MG | 28 days supply | Qty: 4 | Fill #0

## 2018-02-12 MED FILL — HYDROCHLOROTHIAZIDE 12.5 MG: 12.5 | 30 days supply | Qty: 30 | Fill #0

## 2018-02-12 MED FILL — metFORMIN HCL 500 MG TABS: 500 | 30 days supply | Qty: 30 | Fill #0

## 2018-02-12 NOTE — Progress Notes (Signed)
Office: [phone]  /  Fax: [phone]   HPI:   Chief Complaint: OBESITY Leslie Wright is here to discuss her progress with her obesity treatment plan. She is on the Category 2 plan and is following her eating plan approximately 50 % of the time. She states she is exercising 0 minutes 0 times per week. Harman had some celebration eating. She also has been out of her diuretic and is retaining some fluids. She is motivated to get back on track and continue with weight loss. Her weight is 264 lb (119.7 kg) today and has had a weight gain of 5 pounds over a period of 3 weeks since her last visit. She has lost 23 lbs since starting treatment with [place].  Hyperlipidemia Leslie Wright has hyperlipidemia and has been trying to improve her cholesterol levels with intensive lifestyle modification including a low saturated fat diet, exercise and weight loss. She denies any chest pain or claudication.  Hypertension Leslie Wright is a 52 y.o. female with hypertension.  Leslie Wright denies chest pain or shortness of breath on exertion. She is working weight loss to help control her blood pressure with the goal of decreasing her risk of heart attack and stroke. Leslie Wright blood pressure is currently controlled.  At risk for cardiovascular disease Leslie Wright is at a higher than average risk for cardiovascular disease due to obesity, hyperlipidemia and hypertension. She currently denies any chest pain.  Pre-Diabetes Leslie Wright has a diagnosis of prediabetes based on her elevated Hgb A1c and was informed this puts her at greater risk of developing diabetes. She is taking metformin currently and continues to work on diet and exercise to decrease risk of diabetes. She denies nausea, polyphagia or hypoglycemia.  Vitamin D deficiency Leslie Wright has a diagnosis of vitamin D deficiency. She is currently taking vit D and denies nausea, vomiting or muscle weakness.  ALLERGIES: No Known Allergies  MEDICATIONS: Current Outpatient  Medications on File Prior to Visit  Medication Sig Dispense Refill  . acetaminophen (TYLENOL) 650 MG CR tablet Take 650 mg by mouth every 8 (eight) hours as needed for pain.    Marland Kitchen ibuprofen (ADVIL,MOTRIN) 200 MG tablet Take 200 mg by mouth 2 (two) times daily.     . Multiple Vitamin (MULTIVITAMIN) tablet Take 1 tablet by mouth daily.     No current facility-administered medications on file prior to visit.     PAST MEDICAL HISTORY: Past Medical History:  Diagnosis Date  . Acid reflux   . Anemia   . Back pain   . Gallbladder problem   . High blood pressure   . Hip pain   . Pre-diabetes   . Shortness of breath   . Sleep apnea   . Swelling     PAST SURGICAL HISTORY: Past Surgical History:  Procedure Laterality Date  . CARPAL TUNNEL RELEASE    . GALLBLADDER SURGERY     April 2012  . LASIK     2000    SOCIAL HISTORY: Social History   Tobacco Use  . Smoking status: Never Smoker  . Smokeless tobacco: Never Used  Substance Use Topics  . Alcohol use: Not on file  . Drug use: Not on file    FAMILY HISTORY: Family History  Problem Relation Age of Onset  . Diabetes Mother   . Thyroid disease Mother   . Liver disease Mother   . Obesity Mother   . Hypertension Father   . Hyperlipidemia Father   . Heart disease Father   .  Stroke Father   . Obesity Father     ROS: Review of Systems  Constitutional: Negative for weight loss.  Respiratory: Negative for shortness of breath (on exertion).   Cardiovascular: Negative for chest pain and claudication.  Gastrointestinal: Negative for nausea and vomiting.  Musculoskeletal:       Negative for muscle weakness  Endo/Heme/Allergies:       Negative for polyphagia Negative for hypoglycemia    PHYSICAL EXAM: Blood pressure 134/85, pulse (!) 54, temperature 97.6 F (36.4 C), temperature source Oral, height 5\' 6"  (1.676 m), weight 264 lb (119.7 kg), SpO2 98 %. Body mass index is 42.61 kg/m. Physical Exam  Constitutional: She  is oriented to person, place, and time. She appears well-developed and well-nourished.  Cardiovascular: Bradycardia present.  Pulmonary/Chest: Effort normal.  Musculoskeletal: Normal range of motion.  Neurological: She is oriented to person, place, and time.  Skin: Skin is warm and dry.  Psychiatric: She has a normal mood and affect. Her behavior is normal.  Vitals reviewed.   RECENT LABS AND TESTS: BMET    Component Value Date/Time   NA 141 11/06/2017 0850   K 4.7 11/06/2017 0850   CL 104 11/06/2017 0850   CO2 22 11/06/2017 0850   GLUCOSE 89 11/06/2017 0850   GLUCOSE 94 12/28/2010 1530   BUN 15 11/06/2017 0850   CREATININE 1.07 (H) 11/06/2017 0850   CALCIUM 9.3 11/06/2017 0850   GFRNONAA 60 11/06/2017 0850   GFRAA 69 11/06/2017 0850   Lab Results  Component Value Date   HGBA1C 5.6 11/06/2017   HGBA1C 5.6 07/15/2017   HGBA1C 5.5 04/11/2017   HGBA1C 5.6 12/25/2016   HGBA1C 5.5 09/27/2016   Lab Results  Component Value Date   INSULIN 20.3 11/06/2017   INSULIN 22.2 07/15/2017   INSULIN 20.4 04/11/2017   INSULIN 16.9 12/25/2016   INSULIN 20.0 09/27/2016   CBC    Component Value Date/Time   WBC 8.3 09/27/2016 1342   WBC 9.1 12/28/2010 1530   RBC 4.93 09/27/2016 1342   RBC 4.66 12/28/2010 1530   HGB 13.4 09/27/2016 1342   HCT 40.9 09/27/2016 1342   PLT 193 12/28/2010 1530   MCV 83 09/27/2016 1342   MCH 27.2 09/27/2016 1342   MCH 24.9 (L) 12/28/2010 1530   MCHC 32.8 09/27/2016 1342   MCHC 31.8 12/28/2010 1530   RDW 15.9 (H) 09/27/2016 1342   LYMPHSABS 2.3 09/27/2016 1342   MONOABS 1.1 (H) 11/10/2010 0554   EOSABS 0.6 (H) 09/27/2016 1342   BASOSABS 0.0 09/27/2016 1342   Iron/TIBC/Ferritin/ %Sat No results found for: IRON, TIBC, FERRITIN, IRONPCTSAT Lipid Panel     Component Value Date/Time   CHOL 163 11/06/2017 0850   TRIG 130 11/06/2017 0850   HDL 36 (L) 11/06/2017 0850   LDLCALC 101 (H) 11/06/2017 0850   Hepatic Function Panel     Component Value  Date/Time   PROT 6.6 11/06/2017 0850   ALBUMIN 3.9 11/06/2017 0850   AST 16 11/06/2017 0850   ALT 22 11/06/2017 0850   ALKPHOS 67 11/06/2017 0850   BILITOT 0.3 11/06/2017 0850   BILIDIR 0.1 11/09/2010 1645   IBILI 0.4 11/09/2010 1645      Component Value Date/Time   TSH 1.890 09/27/2016 1342   Results for NAKYIAH, [place] "KAY" (MRN [id number]) as of 02/12/2018 11:38  Ref. Range 11/06/2017 08:50  Vitamin D, 25-Hydroxy Latest Ref Range: 30.0 - 100.0 ng/mL 29.8 (L)   ASSESSMENT AND PLAN: Other hyperlipidemia - Plan: atorvastatin (  LIPITOR) 10 MG tablet  Essential hypertension - Plan: hydrochlorothiazide (HYDRODIURIL) 12.5 MG tablet  Prediabetes - Plan: metFORMIN (GLUCOPHAGE) 500 MG tablet  Vitamin D deficiency - Plan: Vitamin D, Ergocalciferol, (DRISDOL) 50000 units CAPS capsule  At risk for heart disease  Class 3 severe obesity with serious comorbidity and body mass index (BMI) of 40.0 to 44.9 in adult, unspecified obesity type ([place])  PLAN:  Hyperlipidemia Leslie Wright was informed of the American Heart Association Guidelines emphasizing intensive lifestyle modifications as the first line treatment for hyperlipidemia. We discussed many lifestyle modifications today in depth, and Leslie Wright will continue to work on decreasing saturated fats such as fatty red meat, butter and many fried foods. She will also increase vegetables and lean protein in her diet and continue to work on exercise and weight loss efforts. Leslie Wright agrees to continue lipitor 10 mg qd #30 with no refills and follow up as directed.  Hypertension We discussed sodium restriction, working on healthy weight loss, and a regular exercise program as the means to achieve improved blood pressure control. Leslie Wright agreed with this plan and agreed to follow up as directed. We will continue to monitor her blood pressure as well as her progress with the above lifestyle modifications. She agrees to continue HCTZ 12.5 mg qd #30 with no refills  and will watch for signs of hypotension as she continues her lifestyle modifications.  Cardiovascular risk counseling Leslie Wright was given extended (15 minutes) coronary artery disease prevention counseling today. She is 52 y.o. female and has risk factors for heart disease including obesity, hyperlipidemia and hypertension. We discussed intensive lifestyle modifications today with an emphasis on specific weight loss instructions and strategies. Pt was also informed of the importance of increasing exercise and decreasing saturated fats to help prevent heart disease.  Pre-Diabetes Leslie Wright will continue to work on weight loss, exercise, and decreasing simple carbohydrates in her diet to help decrease the risk of diabetes. We dicussed metformin including benefits and risks. She was informed that eating too many simple carbohydrates or too many calories at one sitting increases the likelihood of GI side effects. Leslie Wright requested metformin for now and a prescription was written today for 1 month refill. Leslie Wright agreed to follow up with [place] as directed to monitor her progress.  Vitamin D Deficiency Leslie Wright was informed that low vitamin D levels contributes to fatigue and are associated with obesity, breast, and colon cancer. She agrees to continue to take prescription Vit D @50 ,000 IU every week #4 with no refills and will follow up for routine testing of vitamin D, at least 2-3 times per year. She was informed of the risk of over-replacement of vitamin D and agrees to not increase her dose unless she discusses this with [place] first. Leslie Wright agrees to follow up with our clinic in 3 weeks.  Obesity Leslie Wright is currently in the action stage of change. As such, her goal is to continue with weight loss efforts She has agreed to follow the Category 2 plan Leslie Wright has been instructed to work up to a goal of 150 minutes of combined cardio and strengthening exercise per week for weight loss and overall health benefits. We  discussed the following Behavioral Modification Strategies today: increasing lean protein intake and work on meal planning and easy cooking plans  Leslie Wright has agreed to follow up with our clinic in 3 weeks. She was informed of the importance of frequent follow up visits to maximize her success with intensive lifestyle modifications for her multiple health conditions.  Corey Skains, am acting as transcriptionist for [employer], PA-C I, Lacy Duverney [facility], have reviewed this note and agree with its content

## 2018-02-17 DIAGNOSIS — Z0000 Encounter for general adult medical examination without abnormal findings: Secondary | ICD-10-CM | POA: Diagnosis not present

## 2018-02-20 DIAGNOSIS — Z0000 Encounter for general adult medical examination without abnormal findings: Secondary | ICD-10-CM | POA: Diagnosis not present

## 2018-02-20 DIAGNOSIS — Z6841 Body Mass Index (BMI) 40.0 and over, adult: Secondary | ICD-10-CM | POA: Diagnosis not present

## 2018-02-20 MED FILL — ATORVASTATIN 20 MG TABLET: 20 | 90 days supply | Qty: 90 | Fill #0

## 2018-02-26 ENCOUNTER — Ambulatory Visit (INDEPENDENT_AMBULATORY_CARE_PROVIDER_SITE_OTHER): Payer: 59 | Admitting: Physician Assistant

## 2018-03-03 ENCOUNTER — Ambulatory Visit (INDEPENDENT_AMBULATORY_CARE_PROVIDER_SITE_OTHER): Payer: 59 | Admitting: Physician Assistant

## 2018-03-03 VITALS — BP 128/83 | HR 63 | Temp 97.6°F | Ht 66.0 in | Wt 266.0 lb

## 2018-03-03 DIAGNOSIS — E7849 Other hyperlipidemia: Secondary | ICD-10-CM

## 2018-03-03 DIAGNOSIS — Z6841 Body Mass Index (BMI) 40.0 and over, adult: Secondary | ICD-10-CM

## 2018-03-03 NOTE — Progress Notes (Signed)
Office: [phone]  /  Fax: [phone]   HPI:   Chief Complaint: OBESITY Leslie Wright is here to discuss her progress with her obesity treatment plan. She is on the Category 2 plan and is following her eating plan approximately 0 % of the time. She states she is exercising 0 minutes 0 times per week. Leslie Wright is in pain from hip and thus had increase emotional eating. She would like eating strategies after her surgery.  Her weight is 266 lb (120.7 kg) today and has gained 2 pounds since her last visit. She has lost 21 lbs since starting treatment with [place].  Hyperlipidemia Leslie Wright has hyperlipidemia and has been trying to improve her cholesterol levels with intensive lifestyle modification including a low saturated fat diet, exercise and weight loss. She denies any chest pain, claudication or myalgias. Last cholesterol panel was 242 for cholesterol and 153 for LDL. She states Lipitor increased to 20 mg by her primary care physician.  ALLERGIES: No Known Allergies  MEDICATIONS: Current Outpatient Medications on File Prior to Visit  Medication Sig Dispense Refill  . acetaminophen (TYLENOL) 650 MG CR tablet Take 650 mg by mouth every 8 (eight) hours as needed for pain.    Marland Kitchen atorvastatin (LIPITOR) 10 MG tablet Take 1 tablet (10 mg total) by mouth daily. (Patient taking differently: Take 20 mg by mouth daily. ) 30 tablet 0  . hydrochlorothiazide (HYDRODIURIL) 12.5 MG tablet Take 1 tablet (12.5 mg total) by mouth daily. 30 tablet 0  . ibuprofen (ADVIL,MOTRIN) 200 MG tablet Take 200 mg by mouth 2 (two) times daily.     . metFORMIN (GLUCOPHAGE) 500 MG tablet Take 1 tablet (500 mg total) by mouth daily with breakfast. 30 tablet 0  . Multiple Vitamin (MULTIVITAMIN) tablet Take 1 tablet by mouth daily.    . Vitamin D, Ergocalciferol, (DRISDOL) 50000 units CAPS capsule Take 1 capsule (50,000 Units total) by mouth every 7 (seven) days. 4 capsule 0   No current facility-administered medications on file  prior to visit.     PAST MEDICAL HISTORY: Past Medical History:  Diagnosis Date  . Acid reflux   . Anemia   . Back pain   . Gallbladder problem   . High blood pressure   . Hip pain   . Pre-diabetes   . Shortness of breath   . Sleep apnea   . Swelling     PAST SURGICAL HISTORY: Past Surgical History:  Procedure Laterality Date  . CARPAL TUNNEL RELEASE    . GALLBLADDER SURGERY     April 2012  . LASIK     2000    SOCIAL HISTORY: Social History   Tobacco Use  . Smoking status: Never Smoker  . Smokeless tobacco: Never Used  Substance Use Topics  . Alcohol use: Not on file  . Drug use: Not on file    FAMILY HISTORY: Family History  Problem Relation Age of Onset  . Diabetes Mother   . Thyroid disease Mother   . Liver disease Mother   . Obesity Mother   . Hypertension Father   . Hyperlipidemia Father   . Heart disease Father   . Stroke Father   . Obesity Father     ROS: Review of Systems  Constitutional: Negative for weight loss.  Cardiovascular: Negative for chest pain and claudication.  Musculoskeletal: Negative for myalgias.    PHYSICAL EXAM: Blood pressure 128/83, pulse 63, temperature 97.6 F (36.4 C), temperature source Oral, height 5\' 6"  (1.676 m), weight 266  lb (120.7 kg), SpO2 98 %. Body mass index is 42.93 kg/m. Physical Exam  Constitutional: She is oriented to person, place, and time. She appears well-developed and well-nourished.  Cardiovascular: Normal rate.  Pulmonary/Chest: Effort normal.  Musculoskeletal: Normal range of motion.  Neurological: She is oriented to person, place, and time.  Skin: Skin is warm and dry.  Psychiatric: She has a normal mood and affect. Her behavior is normal.  Vitals reviewed.   RECENT LABS AND TESTS: BMET    Component Value Date/Time   NA 141 11/06/2017 0850   K 4.7 11/06/2017 0850   CL 104 11/06/2017 0850   CO2 22 11/06/2017 0850   GLUCOSE 89 11/06/2017 0850   GLUCOSE 94 12/28/2010 1530   BUN 15  11/06/2017 0850   CREATININE 1.07 (H) 11/06/2017 0850   CALCIUM 9.3 11/06/2017 0850   GFRNONAA 60 11/06/2017 0850   GFRAA 69 11/06/2017 0850   Lab Results  Component Value Date   HGBA1C 5.6 11/06/2017   HGBA1C 5.6 07/15/2017   HGBA1C 5.5 04/11/2017   HGBA1C 5.6 12/25/2016   HGBA1C 5.5 09/27/2016   Lab Results  Component Value Date   INSULIN 20.3 11/06/2017   INSULIN 22.2 07/15/2017   INSULIN 20.4 04/11/2017   INSULIN 16.9 12/25/2016   INSULIN 20.0 09/27/2016   CBC    Component Value Date/Time   WBC 8.3 09/27/2016 1342   WBC 9.1 12/28/2010 1530   RBC 4.93 09/27/2016 1342   RBC 4.66 12/28/2010 1530   HGB 13.4 09/27/2016 1342   HCT 40.9 09/27/2016 1342   PLT 193 12/28/2010 1530   MCV 83 09/27/2016 1342   MCH 27.2 09/27/2016 1342   MCH 24.9 (L) 12/28/2010 1530   MCHC 32.8 09/27/2016 1342   MCHC 31.8 12/28/2010 1530   RDW 15.9 (H) 09/27/2016 1342   LYMPHSABS 2.3 09/27/2016 1342   MONOABS 1.1 (H) 11/10/2010 0554   EOSABS 0.6 (H) 09/27/2016 1342   BASOSABS 0.0 09/27/2016 1342   Iron/TIBC/Ferritin/ %Sat No results found for: IRON, TIBC, FERRITIN, IRONPCTSAT Lipid Panel     Component Value Date/Time   CHOL 163 11/06/2017 0850   TRIG 130 11/06/2017 0850   HDL 36 (L) 11/06/2017 0850   LDLCALC 101 (H) 11/06/2017 0850   Hepatic Function Panel     Component Value Date/Time   PROT 6.6 11/06/2017 0850   ALBUMIN 3.9 11/06/2017 0850   AST 16 11/06/2017 0850   ALT 22 11/06/2017 0850   ALKPHOS 67 11/06/2017 0850   BILITOT 0.3 11/06/2017 0850   BILIDIR 0.1 11/09/2010 1645   IBILI 0.4 11/09/2010 1645      Component Value Date/Time   TSH 1.890 09/27/2016 1342    ASSESSMENT AND PLAN: Other hyperlipidemia  Class 3 severe obesity with serious comorbidity and body mass index (BMI) of 40.0 to 44.9 in adult, unspecified obesity type ([place])  PLAN:  Hyperlipidemia Leslie Wright was informed of the American Heart Association Guidelines emphasizing intensive lifestyle  modifications as the first line treatment for hyperlipidemia. We discussed many lifestyle modifications today in depth, and Leslie Wright will continue to work on decreasing saturated fats such as fatty red meat, butter and many fried foods. She will also increase vegetables and lean protein in her diet and continue to work on exercise and weight loss efforts. Leslie Wright agrees to continue her medications and she agrees to follow up with our clinic in 2 to 3 weeks.  We spent > than 50% of the 15 minute visit on the counseling as documented  in the note.  Obesity Leslie Wright is currently in the action stage of change. As such, her goal is to continue with weight loss efforts She has agreed to follow the Category 2 plan Leslie Wright has been instructed to work up to a goal of 150 minutes of combined cardio and strengthening exercise per week for weight loss and overall health benefits. We discussed the following Behavioral Modification Strategies today: keeping healthy foods in the home and planning for success   Min has agreed to follow up with our clinic in 2 to 3 weeks. She was informed of the importance of frequent follow up visits to maximize her success with intensive lifestyle modifications for her multiple health conditions.   Wilhemena Durie, am acting as transcriptionist for Lacy Duverney, PA-C I, Lacy Duverney [facility], have reviewed this note and agree with its content

## 2018-03-12 ENCOUNTER — Other Ambulatory Visit (INDEPENDENT_AMBULATORY_CARE_PROVIDER_SITE_OTHER): Payer: Self-pay

## 2018-03-12 ENCOUNTER — Other Ambulatory Visit (INDEPENDENT_AMBULATORY_CARE_PROVIDER_SITE_OTHER): Payer: Self-pay | Admitting: Physician Assistant

## 2018-03-12 NOTE — Pre-Procedure Instructions (Addendum)
Leslie Wright  03/12/2018      [place], [place] - [address] [facility]. [address] [place] [address] Phone: [phone] Fax: [phone]    Your procedure is scheduled on June 25th, 2019.  Report to [facility] Admitting at 10:20 A.M.  Call this number if you have problems the morning of surgery:  [phone]   Remember:  Nothing to eat or drink after midnight  Continue all medications as directed by your physician except follow these medication instructions before surgery below   Take these medicines the morning of surgery with A SIP OF WATER   acetaminophen (TYLENOL) 650 MG CR tablet- (as needed)  hydrochlorothiazide (MICROZIDE) 12.5 MG capsule  7 days prior to surgery STOP taking any Aspirin(unless otherwise instructed by your surgeon), Aleve, Naproxen, Ibuprofen, Motrin, Advil, Goody's, BC's, all herbal medications, fish oil, and all vitamins   WHAT DO I DO ABOUT MY DIABETES MEDICATION?   Marland Kitchen Do not take oral diabetes medicines (pills) the morning of surgery- Metformin (Glucophage)-500mg   How to Manage Your Diabetes Before and After Surgery  Why is it important to control my blood sugar before and after surgery? . Improving blood sugar levels before and after surgery helps healing and can limit problems. . A way of improving blood sugar control is eating a healthy diet by: o  Eating less sugar and carbohydrates o  Increasing activity/exercise o  Talking with your doctor about reaching your blood sugar goals . High blood sugars (greater than 180 mg/dL) can raise your risk of infections and slow your recovery, so you will need to focus on controlling your diabetes during the weeks before surgery. . Make sure that the doctor who takes care of your diabetes knows about your planned surgery including the date and location.  How do I manage my blood sugar before surgery? . Check your blood sugar at least 4  times a day, starting 2 days before surgery, to make sure that the level is not too high or low. o Check your blood sugar the morning of your surgery when you wake up and every 2 hours until you get to the Short Stay unit. . If your blood sugar is less than 70 mg/dL, you will need to treat for low blood sugar: o Do not take insulin. o Treat a low blood sugar (less than 70 mg/dL) with  cup of clear juice (cranberry or apple), 4 glucose tablets, OR glucose gel. o Recheck blood sugar in 15 minutes after treatment (to make sure it is greater than 70 mg/dL). If your blood sugar is not greater than 70 mg/dL on recheck, call [phone] for further instructions. . Report your blood sugar to the short stay nurse when you get to Short Stay.  . If you are admitted to the hospital after surgery: o Your blood sugar will be checked by the staff and you will probably be given insulin after surgery (instead of oral diabetes medicines) to make sure you have good blood sugar levels. o The goal for blood sugar control after surgery is 80-180 mg/dL.      Do not wear jewelry, make-up or nail polish.  Do not wear lotions, powders, or perfumes, or deodorant.  Do not shave 48 hours prior to surgery.  Men may shave face and neck.  Do not bring valuables to the hospital.  [facility] is not responsible for any belongings or valuables.  Eyeglasses, hearing aids, contacts,  dentures or bridgework may not be worn into surgery.  Leave your suitcase in the car.  After surgery it may be brought to your room.  For patients admitted to the hospital, discharge time will be determined by your treatment team.  Patients discharged the day of surgery will not be allowed to drive home.    [place]- Preparing For Surgery  Before surgery, you can play an important role. Because skin is not sterile, your skin needs to be as free of germs as possible. You can reduce the number of germs on your skin by washing with CHG  (chlorahexidine gluconate) Soap before surgery.  CHG is an antiseptic cleaner which kills germs and bonds with the skin to continue killing germs even after washing.    Oral Hygiene is also important to reduce your risk of infection.  Remember - BRUSH YOUR TEETH THE MORNING OF SURGERY WITH YOUR REGULAR TOOTHPASTE  Please do not use if you have an allergy to CHG or antibacterial soaps. If your skin becomes reddened/irritated stop using the CHG.  Do not shave (including legs and underarms) for at least 48 hours prior to first CHG shower. It is OK to shave your face.  Please follow these instructions carefully.   1. Shower the NIGHT BEFORE SURGERY and the MORNING OF SURGERY with CHG.   2. If you chose to wash your hair, wash your hair first as usual with your normal shampoo.  3. After you shampoo, rinse your hair and body thoroughly to remove the shampoo.  4. Use CHG as you would any other liquid soap. You can apply CHG directly to the skin and wash gently with a scrungie or a clean washcloth.   5. Apply the CHG Soap to your body ONLY FROM THE NECK DOWN.  Do not use on open wounds or open sores. Avoid contact with your eyes, ears, mouth and genitals (private parts). Wash Face and genitals (private parts)  with your normal soap.  6. Wash thoroughly, paying special attention to the area where your surgery will be performed.  7. Thoroughly rinse your body with warm water from the neck down.  8. DO NOT shower/wash with your normal soap after using and rinsing off the CHG Soap.  9. Pat yourself dry with a CLEAN TOWEL.  10. Wear CLEAN PAJAMAS to bed the night before surgery, wear comfortable clothes the morning of surgery  11. Place CLEAN SHEETS on your bed the night of your first shower and DO NOT SLEEP WITH PETS.    Day of Surgery:  Do not apply any deodorants/lotions.  Please wear clean clothes to the hospital/surgery center.   Remember to brush your teeth WITH YOUR REGULAR  TOOTHPASTE.   Please read over the following fact sheets that you were given.

## 2018-03-13 ENCOUNTER — Other Ambulatory Visit: Payer: Self-pay

## 2018-03-13 ENCOUNTER — Encounter (HOSPITAL_COMMUNITY)
Admission: RE | Admit: 2018-03-13 | Discharge: 2018-03-13 | Disposition: A | Discharge status: Home / Self Care | Payer: 59 | Source: Ambulatory Visit | Attending: Orthopaedic Surgery | Admitting: Orthopaedic Surgery

## 2018-03-13 ENCOUNTER — Encounter (HOSPITAL_COMMUNITY): Payer: Self-pay

## 2018-03-13 DIAGNOSIS — Z01818 Encounter for other preprocedural examination: Secondary | ICD-10-CM | POA: Insufficient documentation

## 2018-03-13 DIAGNOSIS — R9431 Abnormal electrocardiogram [ECG] [EKG]: Secondary | ICD-10-CM | POA: Diagnosis not present

## 2018-03-13 DIAGNOSIS — M1612 Unilateral primary osteoarthritis, left hip: Secondary | ICD-10-CM | POA: Diagnosis not present

## 2018-03-13 LAB — BASIC METABOLIC PANEL
Anion gap: 11 (ref 5–15)
BUN: 13 mg/dL (ref 6–20)
CO2: 26 mmol/L (ref 22–32)
Calcium: 9.3 mg/dL (ref 8.9–10.3)
Chloride: 102 mmol/L (ref 101–111)
Creatinine, Ser: 1.16 mg/dL — ABNORMAL HIGH (ref 0.44–1.00)
GFR calc Af Amer: >60 mL/min (ref >60)
GFR, EST NON AFRICAN AMERICAN: 53 mL/min — AB (ref >60)
GLUCOSE: 98 mg/dL (ref 65–99)
POTASSIUM: 3.7 mmol/L (ref 3.5–5.1)
Sodium: 139 mmol/L (ref 135–145)

## 2018-03-13 LAB — CBC
HEMATOCRIT: 43 % (ref 36.0–46.0)
HEMOGLOBIN: 14.1 g/dL (ref 12.0–15.0)
MCH: 27.6 pg (ref 26.0–34.0)
MCHC: 32.8 g/dL (ref 30.0–36.0)
MCV: 84.1 fL (ref 78.0–100.0)
Platelets: 253 10*3/uL (ref 150–400)
RBC: 5.11 MIL/uL (ref 3.87–5.11)
RDW: 14.5 % (ref 11.5–15.5)
WBC: 9.7 10*3/uL (ref 4.0–10.5)

## 2018-03-13 LAB — HEMOGLOBIN A1C
Hgb A1c MFr Bld: 5.4 % (ref 4.8–5.6)
Mean Plasma Glucose: 108.28 mg/dL

## 2018-03-13 LAB — SURGICAL PCR SCREEN
MRSA, PCR: NEGATIVE
STAPHYLOCOCCUS AUREUS: NEGATIVE

## 2018-03-13 LAB — GLUCOSE, CAPILLARY: Glucose-Capillary: 91 mg/dL (ref 65–99)

## 2018-03-13 NOTE — Progress Notes (Addendum)
PCP: Hassel Neth, MD  Cardiologist: pt denies  EKG: obtained today  Stress test: pt denies  ECHO: 10/24/16 in EPIC  Cardiac Cath: pt denies ever  Chest x-ray: pt denies

## 2018-03-17 ENCOUNTER — Other Ambulatory Visit: Payer: Self-pay | Admitting: *Deleted

## 2018-03-17 NOTE — Patient Outreach (Addendum)
[facility]) Care Management  03/17/2018  Leslie Wright January 09, 1966 [id number]   Subjective: Telephone call to patient's home  / mobile number, no answer, left HIPAA compliant voicemail message, and requested call back.     Objective:Per KPN (Knowledge Performance Now, point of care tool) and chart review, patient to be admitted 03/25/18 for LEFT TOTAL HIP ARTHROPLASTY ANTERIOR APPROACH at [facility].   Patient also has a history of hypertension, hyperlipidemia, and sleep apnea.      Assessment: Received UMR  Preoperative / Transition of care referral on 03/14/18.   Preoperative call follow up pending patient contact.      Plan:RNCM will call patient for 2nd telephone outreach attempt, preoperative call follow up, within 10 business days if no return call.      Roosevelt Eimers H. Annia Friendly, BSN, [place] Management [facility] Telephonic CM Phone: [phone] Fax: [phone]

## 2018-03-18 ENCOUNTER — Other Ambulatory Visit: Payer: Self-pay | Admitting: *Deleted

## 2018-03-18 ENCOUNTER — Ambulatory Visit (INDEPENDENT_AMBULATORY_CARE_PROVIDER_SITE_OTHER): Payer: 59 | Admitting: Physician Assistant

## 2018-03-18 VITALS — BP 114/79 | HR 61 | Temp 97.5°F | Ht 66.0 in | Wt 267.0 lb

## 2018-03-18 DIAGNOSIS — Z9189 Other specified personal risk factors, not elsewhere classified: Secondary | ICD-10-CM | POA: Diagnosis not present

## 2018-03-18 DIAGNOSIS — R7303 Prediabetes: Secondary | ICD-10-CM | POA: Diagnosis not present

## 2018-03-18 DIAGNOSIS — I10 Essential (primary) hypertension: Secondary | ICD-10-CM | POA: Diagnosis not present

## 2018-03-18 DIAGNOSIS — E7849 Other hyperlipidemia: Secondary | ICD-10-CM | POA: Diagnosis not present

## 2018-03-18 DIAGNOSIS — E559 Vitamin D deficiency, unspecified: Secondary | ICD-10-CM

## 2018-03-18 DIAGNOSIS — Z6841 Body Mass Index (BMI) 40.0 and over, adult: Secondary | ICD-10-CM | POA: Diagnosis not present

## 2018-03-18 MED ORDER — ATORVASTATIN CALCIUM 20 MG PO TABS: 20.0000 mg | ORAL_TABLET | Freq: Every day | ORAL | 0 refills | Status: DC

## 2018-03-18 MED ORDER — VITAMIN D (ERGOCALCIFEROL) 1.25 MG (50000 UNIT) PO CAPS: 50000.0000 [IU] | ORAL_CAPSULE | ORAL | 0 refills | Status: DC

## 2018-03-18 MED ORDER — METFORMIN HCL 500 MG PO TABS: 500.0000 mg | ORAL_TABLET | Freq: Every day | ORAL | 0 refills | Status: DC

## 2018-03-18 MED ORDER — HYDROCHLOROTHIAZIDE 12.5 MG PO CAPS
12.5000 mg | ORAL_CAPSULE | Freq: Every day | ORAL | 0 refills | Status: DC
Start: 2018-03-18 — End: 2018-05-12

## 2018-03-18 NOTE — Patient Outreach (Signed)
[facility]) Care Management  03/18/2018  PAISLY FINGERHUT 11-Oct-1965 [id number]   Subjective: Telephone call to patient's home / mobile number, spoke with patient, and HIPAA verified.  Discussed [facility] Care Management UMR Transition of care follow up, preoperative call follow up, patient voiced understanding, and is in agreement to both types of follow up.   Patient states she is doing well, ready for surgery on 03/25/18 at [facility], estimated length of stay 2 days, and will be staying with aunt post hospital discharge.  States primary MD aware of surgery, patient will call MD's office after discharge to give update, and is aware if needed MD may request office follow visit.  Patient states she is able to manage self care and has assistance as needed with activities of daily living / home management post hospital discharge.  Patient voices understanding of medical diagnosis, pending surgery, and treatment plan.  States she is accessing the following [facility] benefits: outpatient pharmacy, hospital indemnity (not chosen) , family medical leave act Ecologist) not needed will use pal annual leave time, and can work remotely.  Discussed Advanced Directives, advised of [facility] Employee Spiritual Care Advanced Directives document completion benefit, verbally given contact number [phone], patient voices understanding, and states she will call to make an appointment.  Patient had questions regarding estimated out of pocket for surgery, advised to call [place] (verbally given contact number [phone]),  [facility] [employer], and / or [facility] Benefit Specialist (verbally given contact number [phone]), patient voiced understanding, and states she will follow up.  Patient states she does not have any preoperative questions, care coordination, disease management, disease monitoring, transportation, community resource, or pharmacy needs at this time.   States she  is very appreciative of the follow up and is in agreement to receive [place] Management information post transition of care follow up.     Objective:Per KPN (Knowledge Performance Now, point of care tool) and chart review, patient to be admitted 03/25/18 for LEFT TOTAL HIP ARTHROPLASTY ANTERIOR APPROACH at [facility].   Patient also has a history of hypertension, hyperlipidemia, and sleep apnea.      Assessment: Received UMR  Preoperative / Transition of care referral on 03/14/18.   Preoperative call completed, and transition of care follow up pending notification of patient discharge.    Plan:RNCM will call patient for  telephone outreach attempt, transition of care follow up, within 3 business days of hospital discharge notification.     Zamiyah Resendes H. Annia Friendly, BSN, [place] Management [facility] Telephonic CM Phone: [phone] Fax: [phone]

## 2018-03-19 MED FILL — VIT D2 1.25 MG (50,000 UNIT: 1.25 MG | 28 days supply | Qty: 4 | Fill #0

## 2018-03-19 MED FILL — metFORMIN HCL 500 MG TABS: 500 | 30 days supply | Qty: 30 | Fill #0

## 2018-03-19 MED FILL — HYDROCHLOROTHIAZIDE 12.5 MG: 12.5 | 30 days supply | Qty: 30 | Fill #0

## 2018-03-19 NOTE — Progress Notes (Signed)
Office: [phone]  /  Fax: [phone]   HPI:   Chief Complaint: OBESITY Leslie Wright is here to discuss her progress with her obesity treatment plan. She is on the Category 2 plan and is following her eating plan approximately 0 % of the time. She states she is exercising 0 minutes 0 times per week. Leslie Wright had an increase in emotional eating, as she is anticipating surgery, which is scheduled for next week. Her weight is 267 lb (121.1 kg) today and has had a weight loss of 1 pounds over a period of 2 weeks since her last visit. She has lost 20 lbs since starting treatment with [place].  Vitamin D deficiency Leslie Wright has a diagnosis of vitamin D deficiency. She is currently taking vit D and denies nausea, vomiting or muscle weakness.  Pre-Diabetes Leslie Wright has a diagnosis of prediabetes based on her elevated Hgb A1c and was informed this puts her at greater risk of developing diabetes. She is taking metformin currently and continues to work on diet and exercise to decrease risk of diabetes. She denies nausea, polyphagia or hypoglycemia.  Hypertension Leslie Wright is a 52 y.o. female with hypertension.  Leslie Wright denies chest pain or shortness of breath on exertion. She is working weight loss to help control her blood pressure with the goal of decreasing her risk of heart attack and stroke. Leslie Wright blood pressure is stable.  Hyperlipidemia Leslie Wright has hyperlipidemia and has been trying to improve her cholesterol levels with intensive lifestyle modification including a low saturated fat diet, exercise and weight loss. Leslie Wright requests a refill of her Lipitor. She states her last dose has recently been increased to 20 mg. She denies any chest pain, claudication or myalgias.  ALLERGIES: No Known Allergies  MEDICATIONS: Current Outpatient Medications on File Prior to Visit  Medication Sig Dispense Refill  . acetaminophen (TYLENOL) 650 MG CR tablet Take 650 mg by mouth See admin instructions.  TAKE 1 TABLET (650 MG) BY MOUTH SCHEDULED IN THE MORNING, MAY REPEAT DOSE IN THE EVENING IF NEEDED FOR PAIN.    Leslie Wright Kitchen Cyanocobalamin (B-12) 2500 MCG TABS Take 2,500 mcg by mouth daily.    Leslie Wright Kitchen ibuprofen (ADVIL,MOTRIN) 200 MG tablet Take 400 mg by mouth daily.      No current facility-administered medications on file prior to visit.     PAST MEDICAL HISTORY: Past Medical History:  Diagnosis Date  . Acid reflux   . Anemia   . Back pain   . Gallbladder problem   . High blood pressure   . Hip pain   . Pre-diabetes   . Shortness of breath   . Sleep apnea   . Swelling     PAST SURGICAL HISTORY: Past Surgical History:  Procedure Laterality Date  . CARPAL TUNNEL RELEASE    . GALLBLADDER SURGERY     April 2012  . LASIK     2000    SOCIAL HISTORY: Social History   Tobacco Use  . Smoking status: Never Smoker  . Smokeless tobacco: Never Used  Substance Use Topics  . Alcohol use: Not Currently  . Drug use: Not Currently    FAMILY HISTORY: Family History  Problem Relation Age of Onset  . Diabetes Mother   . Thyroid disease Mother   . Liver disease Mother   . Obesity Mother   . Hypertension Father   . Hyperlipidemia Father   . Heart disease Father   . Stroke Father   . Obesity Father  ROS: Review of Systems  Constitutional: Negative for weight loss.  Respiratory: Negative for shortness of breath (on exertion).   Cardiovascular: Negative for chest pain and claudication.  Gastrointestinal: Negative for nausea and vomiting.  Musculoskeletal: Negative for myalgias.       Negative for muscle weakness  Endo/Heme/Allergies:       Negative for polyphagia Negative for hypoglycemia    PHYSICAL EXAM: Blood pressure 114/79, pulse 61, temperature (!) 97.5 F (36.4 C), temperature source Oral, height 5\' 6"  (1.676 m), weight 267 lb (121.1 kg), SpO2 98 %. Body mass index is 43.09 kg/m. Physical Exam  Constitutional: She is oriented to person, place, and time. She appears  well-developed and well-nourished.  Cardiovascular: Normal rate.  Pulmonary/Chest: Effort normal.  Musculoskeletal: Normal range of motion.  Neurological: She is oriented to person, place, and time.  Skin: Skin is warm and dry.  Psychiatric: She has a normal mood and affect. Her behavior is normal.  Vitals reviewed.   RECENT LABS AND TESTS: BMET    Component Value Date/Time   NA 139 03/13/2018 1539   NA 141 11/06/2017 0850   K 3.7 03/13/2018 1539   CL 102 03/13/2018 1539   CO2 26 03/13/2018 1539   GLUCOSE 98 03/13/2018 1539   BUN 13 03/13/2018 1539   BUN 15 11/06/2017 0850   CREATININE 1.16 (H) 03/13/2018 1539   CALCIUM 9.3 03/13/2018 1539   GFRNONAA 53 (L) 03/13/2018 1539   GFRAA >60 03/13/2018 1539   Lab Results  Component Value Date   HGBA1C 5.4 03/13/2018   HGBA1C 5.6 11/06/2017   HGBA1C 5.6 07/15/2017   HGBA1C 5.5 04/11/2017   HGBA1C 5.6 12/25/2016   Lab Results  Component Value Date   INSULIN 20.3 11/06/2017   INSULIN 22.2 07/15/2017   INSULIN 20.4 04/11/2017   INSULIN 16.9 12/25/2016   INSULIN 20.0 09/27/2016   CBC    Component Value Date/Time   WBC 9.7 03/13/2018 1539   RBC 5.11 03/13/2018 1539   HGB 14.1 03/13/2018 1539   HGB 13.4 09/27/2016 1342   HCT 43.0 03/13/2018 1539   HCT 40.9 09/27/2016 1342   PLT 253 03/13/2018 1539   MCV 84.1 03/13/2018 1539   MCV 83 09/27/2016 1342   MCH 27.6 03/13/2018 1539   MCHC 32.8 03/13/2018 1539   RDW 14.5 03/13/2018 1539   RDW 15.9 (H) 09/27/2016 1342   LYMPHSABS 2.3 09/27/2016 1342   MONOABS 1.1 (H) 11/10/2010 0554   EOSABS 0.6 (H) 09/27/2016 1342   BASOSABS 0.0 09/27/2016 1342   Iron/TIBC/Ferritin/ %Sat No results found for: IRON, TIBC, FERRITIN, IRONPCTSAT Lipid Panel     Component Value Date/Time   CHOL 163 11/06/2017 0850   TRIG 130 11/06/2017 0850   HDL 36 (L) 11/06/2017 0850   LDLCALC 101 (H) 11/06/2017 0850   Hepatic Function Panel     Component Value Date/Time   PROT 6.6 11/06/2017 0850     ALBUMIN 3.9 11/06/2017 0850   AST 16 11/06/2017 0850   ALT 22 11/06/2017 0850   ALKPHOS 67 11/06/2017 0850   BILITOT 0.3 11/06/2017 0850   BILIDIR 0.1 11/09/2010 1645   IBILI 0.4 11/09/2010 1645      Component Value Date/Time   TSH 1.890 09/27/2016 1342   Results for LATONJA, BOBECK "KAY" (MRN [id number]) as of 03/19/2018 08:36  Ref. Range 11/06/2017 08:50  Vitamin D, 25-Hydroxy Latest Ref Range: 30.0 - 100.0 ng/mL 29.8 (L)   ASSESSMENT AND PLAN: Vitamin D deficiency - Plan: Vitamin  D, Ergocalciferol, (DRISDOL) 50000 units CAPS capsule  Prediabetes - Plan: metFORMIN (GLUCOPHAGE) 500 MG tablet  Other hyperlipidemia - Plan: atorvastatin (LIPITOR) 20 MG tablet  Essential hypertension - Plan: hydrochlorothiazide (MICROZIDE) 12.5 MG capsule  At risk for heart disease  Class 3 severe obesity with serious comorbidity and body mass index (BMI) of 40.0 to 44.9 in adult, unspecified obesity type (Leslie Wright)  PLAN:  Vitamin D Deficiency Leslie Wright was informed that low vitamin D levels contributes to fatigue and are associated with obesity, breast, and colon cancer. She agrees to continue to take prescription Vit D @50 ,000 IU every week #4 with no refills and will follow up for routine testing of vitamin D, at least 2-3 times per year. She was informed of the risk of over-replacement of vitamin D and agrees to not increase her dose unless she discusses this with [place] first. Leslie Wright agrees to follow up as directed.  Pre-Diabetes Leslie Wright will continue to work on weight loss, exercise, and decreasing simple carbohydrates in her diet to help decrease the risk of diabetes. We dicussed metformin including benefits and risks. She was informed that eating too many simple carbohydrates or too many calories at one sitting increases the likelihood of GI side effects. Leslie Wright requested metformin for now and a prescription was written today for 1 month refill. Leslie Wright agreed to follow up with [place] as directed to monitor her  progress.  Hypertension We discussed sodium restriction, working on healthy weight loss, and a regular exercise program as the means to achieve improved blood pressure control. Leslie Wright agreed with this plan and agreed to follow up as directed. We will continue to monitor her blood pressure as well as her progress with the above lifestyle modifications. She agrees to continue HCTZ 12.5 mg qd #30 with no refills and will watch for signs of hypotension as she continues her lifestyle modifications.  Hyperlipidemia Leslie Wright was informed of the American Heart Association Guidelines emphasizing intensive lifestyle modifications as the first line treatment for hyperlipidemia. We discussed many lifestyle modifications today in depth, and Leslie Wright will continue to work on decreasing saturated fats such as fatty red meat, butter and many fried foods. She will also increase vegetables and lean protein in her diet and continue to work on exercise and weight loss efforts. Leslie Wright agrees to continue Lipitor 20 mg qd #30 with no refills and follow up as directed.  Cardiovascular risk counseling Leslie Wright was given extended (15 minutes) coronary artery disease prevention counseling today. She is 52 y.o. female and has risk factors for heart disease including obesity, hypertension and hyperlipidemia. We discussed intensive lifestyle modifications today with an emphasis on specific weight loss instructions and strategies. Pt was also informed of the importance of increasing exercise and decreasing saturated fats to help prevent heart disease.  Obesity Leslie Wright is currently in the action stage of change. As such, her goal is to continue with weight loss efforts She has agreed to follow the Category 2 plan Leslie Wright has been instructed to work up to a goal of 150 minutes of combined cardio and strengthening exercise per week for weight loss and overall health benefits. We discussed the following Behavioral Modification Strategies  today: planning for success, decreasing simple carbohydrates  and avoiding temptations  Crucita has agreed to follow up with our clinic in 4 weeks. She was informed of the importance of frequent follow up visits to maximize her success with intensive lifestyle modifications for her multiple health conditions.  Leslie Skains, am acting as transcriptionist for  Lacy Duverney, PA-C I, Lacy Duverney [facility], have reviewed this note and agree with its content

## 2018-03-24 MED ORDER — DEXTROSE 5 % IV SOLN
3.0000 g | INTRAVENOUS | Status: AC
  Administered 2018-03-25: 3 g via INTRAVENOUS
  Filled 2018-03-24: qty 3

## 2018-03-24 MED ORDER — TRANEXAMIC ACID 1000 MG/10ML IV SOLN
1000.0000 mg | INTRAVENOUS | Status: AC
  Administered 2018-03-25: 1000 mg via INTRAVENOUS
  Filled 2018-03-24: qty 1100

## 2018-03-25 ENCOUNTER — Inpatient Hospital Stay (HOSPITAL_COMMUNITY): Payer: 59

## 2018-03-25 ENCOUNTER — Encounter (HOSPITAL_COMMUNITY): Payer: Self-pay | Admitting: *Deleted

## 2018-03-25 ENCOUNTER — Other Ambulatory Visit: Payer: Self-pay

## 2018-03-25 ENCOUNTER — Inpatient Hospital Stay (HOSPITAL_COMMUNITY)
Admission: RE | Admit: 2018-03-25 | Discharge: 2018-03-27 | DRG: 470 | Disposition: A | Discharge status: Home Health | Payer: 59 | Source: Ambulatory Visit | Attending: Orthopaedic Surgery | Admitting: Orthopaedic Surgery

## 2018-03-25 ENCOUNTER — Inpatient Hospital Stay (HOSPITAL_COMMUNITY): Payer: 59 | Admitting: Certified Registered"

## 2018-03-25 ENCOUNTER — Encounter (HOSPITAL_COMMUNITY)
Admission: RE | Disposition: A | Discharge status: Home Health | Payer: Self-pay | Source: Ambulatory Visit | Attending: Orthopaedic Surgery

## 2018-03-25 DIAGNOSIS — Z833 Family history of diabetes mellitus: Secondary | ICD-10-CM

## 2018-03-25 DIAGNOSIS — G4733 Obstructive sleep apnea (adult) (pediatric): Secondary | ICD-10-CM | POA: Diagnosis present

## 2018-03-25 DIAGNOSIS — Z8249 Family history of ischemic heart disease and other diseases of the circulatory system: Secondary | ICD-10-CM | POA: Diagnosis not present

## 2018-03-25 DIAGNOSIS — M549 Dorsalgia, unspecified: Secondary | ICD-10-CM | POA: Diagnosis present

## 2018-03-25 DIAGNOSIS — Z419 Encounter for procedure for purposes other than remedying health state, unspecified: Secondary | ICD-10-CM

## 2018-03-25 DIAGNOSIS — K219 Gastro-esophageal reflux disease without esophagitis: Secondary | ICD-10-CM | POA: Diagnosis present

## 2018-03-25 DIAGNOSIS — Z87442 Personal history of urinary calculi: Secondary | ICD-10-CM | POA: Diagnosis not present

## 2018-03-25 DIAGNOSIS — I10 Essential (primary) hypertension: Secondary | ICD-10-CM | POA: Diagnosis present

## 2018-03-25 DIAGNOSIS — Z8349 Family history of other endocrine, nutritional and metabolic diseases: Secondary | ICD-10-CM | POA: Diagnosis not present

## 2018-03-25 DIAGNOSIS — M1612 Unilateral primary osteoarthritis, left hip: Principal | ICD-10-CM

## 2018-03-25 DIAGNOSIS — Z6841 Body Mass Index (BMI) 40.0 and over, adult: Secondary | ICD-10-CM

## 2018-03-25 DIAGNOSIS — M25552 Pain in left hip: Secondary | ICD-10-CM | POA: Diagnosis present

## 2018-03-25 DIAGNOSIS — Z823 Family history of stroke: Secondary | ICD-10-CM | POA: Diagnosis not present

## 2018-03-25 DIAGNOSIS — Z471 Aftercare following joint replacement surgery: Secondary | ICD-10-CM | POA: Diagnosis not present

## 2018-03-25 DIAGNOSIS — E559 Vitamin D deficiency, unspecified: Secondary | ICD-10-CM | POA: Diagnosis present

## 2018-03-25 DIAGNOSIS — D649 Anemia, unspecified: Secondary | ICD-10-CM | POA: Diagnosis present

## 2018-03-25 DIAGNOSIS — Z96642 Presence of left artificial hip joint: Secondary | ICD-10-CM

## 2018-03-25 DIAGNOSIS — R7303 Prediabetes: Secondary | ICD-10-CM | POA: Diagnosis present

## 2018-03-25 DIAGNOSIS — E782 Mixed hyperlipidemia: Secondary | ICD-10-CM | POA: Diagnosis present

## 2018-03-25 HISTORY — DX: Pure hypercholesterolemia, unspecified: E78.00

## 2018-03-25 HISTORY — DX: Family history of other specified conditions: Z84.89

## 2018-03-25 HISTORY — PX: TOTAL HIP ARTHROPLASTY: SHX124

## 2018-03-25 HISTORY — DX: Personal history of urinary calculi: Z87.442

## 2018-03-25 HISTORY — DX: Obstructive sleep apnea (adult) (pediatric): G47.33

## 2018-03-25 HISTORY — DX: Other specified soft tissue disorders: M79.89

## 2018-03-25 HISTORY — DX: Unspecified osteoarthritis, unspecified site: M19.90

## 2018-03-25 LAB — GLUCOSE, CAPILLARY: Glucose-Capillary: 83 mg/dL (ref 70–99)

## 2018-03-25 SURGERY — ARTHROPLASTY, HIP, TOTAL, ANTERIOR APPROACH
Anesthesia: Monitor Anesthesia Care | Site: Hip | Laterality: Left

## 2018-03-25 MED ORDER — OXYCODONE HCL 5 MG PO TABS
5.0000 mg | ORAL_TABLET | ORAL | Status: DC | PRN
  Administered 2018-03-25 – 2018-03-26 (×4): 10 mg via ORAL
  Filled 2018-03-25 (×5): qty 2

## 2018-03-25 MED ORDER — POLYETHYLENE GLYCOL 3350 17 G PO PACK: 17.0000 g | PACK | Freq: Every day | ORAL | Status: DC | PRN

## 2018-03-25 MED ORDER — ASPIRIN 81 MG PO CHEW
81.0000 mg | CHEWABLE_TABLET | Freq: Two times a day (BID) | ORAL | Status: DC
  Administered 2018-03-25 – 2018-03-27 (×4): 81 mg via ORAL
  Filled 2018-03-25 (×4): qty 1

## 2018-03-25 MED ORDER — LACTATED RINGERS IV SOLN
INTRAVENOUS | Status: DC | PRN
  Administered 2018-03-25 (×2): via INTRAVENOUS

## 2018-03-25 MED ORDER — SODIUM CHLORIDE 0.9 % IR SOLN
Status: DC | PRN
  Administered 2018-03-25: 3000 mL

## 2018-03-25 MED ORDER — 0.9 % SODIUM CHLORIDE (POUR BTL) OPTIME
TOPICAL | Status: DC | PRN
  Administered 2018-03-25: 1000 mL

## 2018-03-25 MED ORDER — PHENYLEPHRINE 40 MCG/ML (10ML) SYRINGE FOR IV PUSH (FOR BLOOD PRESSURE SUPPORT)
PREFILLED_SYRINGE | INTRAVENOUS | Status: AC
  Filled 2018-03-25: qty 10

## 2018-03-25 MED ORDER — ACETAMINOPHEN 325 MG PO TABS
325.0000 mg | ORAL_TABLET | Freq: Four times a day (QID) | ORAL | Status: DC | PRN
  Administered 2018-03-26 – 2018-03-27 (×2): 650 mg via ORAL
  Filled 2018-03-25 (×2): qty 2

## 2018-03-25 MED ORDER — GABAPENTIN 100 MG PO CAPS
100.0000 mg | ORAL_CAPSULE | Freq: Three times a day (TID) | ORAL | Status: DC
  Administered 2018-03-25 – 2018-03-27 (×6): 100 mg via ORAL
  Filled 2018-03-25 (×7): qty 1

## 2018-03-25 MED ORDER — ALUM & MAG HYDROXIDE-SIMETH 200-200-20 MG/5ML PO SUSP: 30.0000 mL | ORAL | Status: DC | PRN

## 2018-03-25 MED ORDER — EPHEDRINE SULFATE 50 MG/ML IJ SOLN
INTRAMUSCULAR | Status: DC | PRN
  Administered 2018-03-25: 10 mg via INTRAVENOUS

## 2018-03-25 MED ORDER — METFORMIN HCL 500 MG PO TABS
500.0000 mg | ORAL_TABLET | Freq: Every day | ORAL | Status: DC
  Administered 2018-03-26 – 2018-03-27 (×2): 500 mg via ORAL
  Filled 2018-03-25 (×2): qty 1

## 2018-03-25 MED ORDER — METOCLOPRAMIDE HCL 5 MG/ML IJ SOLN
5.0000 mg | Freq: Three times a day (TID) | INTRAMUSCULAR | Status: DC | PRN
  Administered 2018-03-26: 10 mg via INTRAVENOUS
  Filled 2018-03-25: qty 2

## 2018-03-25 MED ORDER — OXYCODONE HCL 5 MG PO TABS: 5.0000 mg | ORAL_TABLET | Freq: Once | ORAL | Status: DC | PRN

## 2018-03-25 MED ORDER — METOCLOPRAMIDE HCL 5 MG PO TABS: 5.0000 mg | ORAL_TABLET | Freq: Three times a day (TID) | ORAL | Status: DC | PRN

## 2018-03-25 MED ORDER — ONDANSETRON HCL 4 MG/2ML IJ SOLN
INTRAMUSCULAR | Status: DC | PRN
  Administered 2018-03-25: 4 mg via INTRAVENOUS

## 2018-03-25 MED ORDER — CEFAZOLIN SODIUM-DEXTROSE 1-4 GM/50ML-% IV SOLN
1.0000 g | Freq: Four times a day (QID) | INTRAVENOUS | Status: AC
  Administered 2018-03-25 – 2018-03-26 (×2): 1 g via INTRAVENOUS
  Filled 2018-03-25 (×2): qty 50

## 2018-03-25 MED ORDER — METHOCARBAMOL 500 MG PO TABS
500.0000 mg | ORAL_TABLET | Freq: Four times a day (QID) | ORAL | Status: DC | PRN
  Administered 2018-03-25 – 2018-03-26 (×2): 500 mg via ORAL
  Filled 2018-03-25 (×2): qty 1

## 2018-03-25 MED ORDER — MIDAZOLAM HCL 5 MG/5ML IJ SOLN
INTRAMUSCULAR | Status: DC | PRN
  Administered 2018-03-25: 2 mg via INTRAVENOUS

## 2018-03-25 MED ORDER — OXYCODONE HCL 5 MG/5ML PO SOLN: 5.0000 mg | Freq: Once | ORAL | Status: DC | PRN

## 2018-03-25 MED ORDER — FENTANYL CITRATE (PF) 250 MCG/5ML IJ SOLN
INTRAMUSCULAR | Status: AC
  Filled 2018-03-25: qty 5

## 2018-03-25 MED ORDER — PROPOFOL 10 MG/ML IV BOLUS
INTRAVENOUS | Status: AC
  Filled 2018-03-25: qty 20

## 2018-03-25 MED ORDER — ATORVASTATIN CALCIUM 20 MG PO TABS
20.0000 mg | ORAL_TABLET | Freq: Every day | ORAL | Status: DC
  Administered 2018-03-25 – 2018-03-26 (×2): 20 mg via ORAL
  Filled 2018-03-25 (×2): qty 1

## 2018-03-25 MED ORDER — ONDANSETRON HCL 4 MG/2ML IJ SOLN
INTRAMUSCULAR | Status: AC
  Filled 2018-03-25: qty 2

## 2018-03-25 MED ORDER — MENTHOL 3 MG MT LOZG: 1.0000 | LOZENGE | OROMUCOSAL | Status: DC | PRN

## 2018-03-25 MED ORDER — PANTOPRAZOLE SODIUM 40 MG PO TBEC
40.0000 mg | DELAYED_RELEASE_TABLET | Freq: Every day | ORAL | Status: DC
  Administered 2018-03-25 – 2018-03-27 (×3): 40 mg via ORAL
  Filled 2018-03-25 (×3): qty 1

## 2018-03-25 MED ORDER — DOCUSATE SODIUM 100 MG PO CAPS
100.0000 mg | ORAL_CAPSULE | Freq: Two times a day (BID) | ORAL | Status: DC
  Administered 2018-03-25 – 2018-03-27 (×4): 100 mg via ORAL
  Filled 2018-03-25 (×5): qty 1

## 2018-03-25 MED ORDER — CHLORHEXIDINE GLUCONATE 4 % EX LIQD: 60.0000 mL | Freq: Once | CUTANEOUS | Status: DC

## 2018-03-25 MED ORDER — PHENYLEPHRINE HCL 10 MG/ML IJ SOLN
INTRAMUSCULAR | Status: DC | PRN
  Administered 2018-03-25: 200 ug via INTRAVENOUS

## 2018-03-25 MED ORDER — FENTANYL CITRATE (PF) 100 MCG/2ML IJ SOLN
INTRAMUSCULAR | Status: DC | PRN
  Administered 2018-03-25 (×2): 50 ug via INTRAVENOUS

## 2018-03-25 MED ORDER — ONDANSETRON HCL 4 MG/2ML IJ SOLN
4.0000 mg | Freq: Four times a day (QID) | INTRAMUSCULAR | Status: DC | PRN
  Administered 2018-03-26: 4 mg via INTRAVENOUS
  Filled 2018-03-25: qty 2

## 2018-03-25 MED ORDER — PROPOFOL 500 MG/50ML IV EMUL
INTRAVENOUS | Status: DC | PRN
  Administered 2018-03-25: 30 ug/kg/min via INTRAVENOUS

## 2018-03-25 MED ORDER — SODIUM CHLORIDE 0.9 % IV SOLN
INTRAVENOUS | Status: DC
  Administered 2018-03-25: 16:00:00 via INTRAVENOUS

## 2018-03-25 MED ORDER — ONDANSETRON HCL 4 MG PO TABS
4.0000 mg | ORAL_TABLET | Freq: Four times a day (QID) | ORAL | Status: DC | PRN
  Filled 2018-03-25 (×2): qty 1

## 2018-03-25 MED ORDER — PHENOL 1.4 % MT LIQD: 1.0000 | OROMUCOSAL | Status: DC | PRN

## 2018-03-25 MED ORDER — HYDROCHLOROTHIAZIDE 12.5 MG PO CAPS
12.5000 mg | ORAL_CAPSULE | Freq: Every day | ORAL | Status: DC
  Administered 2018-03-25 – 2018-03-27 (×3): 12.5 mg via ORAL
  Filled 2018-03-25 (×3): qty 1

## 2018-03-25 MED ORDER — FENTANYL CITRATE (PF) 100 MCG/2ML IJ SOLN
INTRAMUSCULAR | Status: AC
  Filled 2018-03-25: qty 2

## 2018-03-25 MED ORDER — VITAMIN B-12 1000 MCG PO TABS
2500.0000 ug | ORAL_TABLET | Freq: Every day | ORAL | Status: DC
  Administered 2018-03-25 – 2018-03-27 (×3): 2500 ug via ORAL
  Filled 2018-03-25 (×3): qty 3

## 2018-03-25 MED ORDER — MIDAZOLAM HCL 2 MG/2ML IJ SOLN
INTRAMUSCULAR | Status: AC
  Filled 2018-03-25: qty 2

## 2018-03-25 MED ORDER — METHOCARBAMOL 1000 MG/10ML IJ SOLN
500.0000 mg | Freq: Four times a day (QID) | INTRAVENOUS | Status: DC | PRN
  Filled 2018-03-25: qty 5

## 2018-03-25 MED ORDER — FENTANYL CITRATE (PF) 100 MCG/2ML IJ SOLN
25.0000 ug | INTRAMUSCULAR | Status: DC | PRN
  Administered 2018-03-25 (×2): 50 ug via INTRAVENOUS

## 2018-03-25 MED ORDER — HYDROMORPHONE HCL 2 MG/ML IJ SOLN
0.5000 mg | INTRAMUSCULAR | Status: DC | PRN
  Administered 2018-03-25 – 2018-03-26 (×3): 1 mg via INTRAVENOUS
  Filled 2018-03-25 (×3): qty 1

## 2018-03-25 MED ORDER — OXYCODONE HCL 5 MG PO TABS
10.0000 mg | ORAL_TABLET | ORAL | Status: DC | PRN
  Administered 2018-03-26: 10 mg via ORAL

## 2018-03-25 SURGICAL SUPPLY — 53 items
APL SKNCLS STERI-STRIP NONHPOA (GAUZE/BANDAGES/DRESSINGS) ×1
BENZOIN TINCTURE PRP APPL 2/3 (GAUZE/BANDAGES/DRESSINGS) ×2 IMPLANT
BLADE CLIPPER SURG (BLADE) IMPLANT
BLADE SAW SGTL 18X1.27X75 (BLADE) ×2 IMPLANT
CAPT HIP TOTAL 2 ×1 IMPLANT
CELLS DAT CNTRL 66122 CELL SVR (MISCELLANEOUS) ×1 IMPLANT
COVER SURGICAL LIGHT HANDLE (MISCELLANEOUS) ×2 IMPLANT
DRAPE C-ARM 42X72 X-RAY (DRAPES) ×2 IMPLANT
DRAPE STERI IOBAN 125X83 (DRAPES) ×2 IMPLANT
DRAPE U-SHAPE 47X51 STRL (DRAPES) ×6 IMPLANT
DRSG AQUACEL AG ADV 3.5X10 (GAUZE/BANDAGES/DRESSINGS) ×2 IMPLANT
DURAPREP 26ML APPLICATOR (WOUND CARE) ×2 IMPLANT
ELECT BLADE 4.0 EZ CLEAN MEGAD (MISCELLANEOUS) ×2
ELECT BLADE 6.5 EXT (BLADE) IMPLANT
ELECT REM PT RETURN 9FT ADLT (ELECTROSURGICAL) ×2
ELECTRODE BLDE 4.0 EZ CLN MEGD (MISCELLANEOUS) ×1 IMPLANT
ELECTRODE REM PT RTRN 9FT ADLT (ELECTROSURGICAL) ×1 IMPLANT
FACESHIELD WRAPAROUND (MASK) ×4 IMPLANT
FACESHIELD WRAPAROUND OR TEAM (MASK) ×2 IMPLANT
GAUZE XEROFORM 1X8 LF (GAUZE/BANDAGES/DRESSINGS) ×1 IMPLANT
GLOVE BIOGEL PI IND STRL 8 (GLOVE) ×2 IMPLANT
GLOVE BIOGEL PI INDICATOR 8 (GLOVE) ×2
GLOVE ECLIPSE 8.0 STRL XLNG CF (GLOVE) ×2 IMPLANT
GLOVE ORTHO TXT STRL SZ7.5 (GLOVE) ×4 IMPLANT
GOWN STRL REUS W/ TWL LRG LVL3 (GOWN DISPOSABLE) ×2 IMPLANT
GOWN STRL REUS W/ TWL XL LVL3 (GOWN DISPOSABLE) ×2 IMPLANT
GOWN STRL REUS W/TWL LRG LVL3 (GOWN DISPOSABLE) ×4
GOWN STRL REUS W/TWL XL LVL3 (GOWN DISPOSABLE) ×4
HANDPIECE INTERPULSE COAX TIP (DISPOSABLE) ×2
KIT BASIN OR (CUSTOM PROCEDURE TRAY) ×2 IMPLANT
KIT TURNOVER KIT B (KITS) ×2 IMPLANT
MANIFOLD NEPTUNE II (INSTRUMENTS) ×2 IMPLANT
NS IRRIG 1000ML POUR BTL (IV SOLUTION) ×2 IMPLANT
PACK TOTAL JOINT (CUSTOM PROCEDURE TRAY) ×2 IMPLANT
PAD ARMBOARD 7.5X6 YLW CONV (MISCELLANEOUS) ×2 IMPLANT
RETRACTOR WND ALEXIS 18 MED (MISCELLANEOUS) ×1 IMPLANT
RTRCTR WOUND ALEXIS 18CM MED (MISCELLANEOUS) ×2
SET HNDPC FAN SPRY TIP SCT (DISPOSABLE) ×1 IMPLANT
STAPLER VISISTAT 35W (STAPLE) IMPLANT
STRIP CLOSURE SKIN 1/2X4 (GAUZE/BANDAGES/DRESSINGS) ×4 IMPLANT
SUT ETHIBOND NAB CT1 #1 30IN (SUTURE) ×2 IMPLANT
SUT MNCRL AB 4-0 PS2 18 (SUTURE) IMPLANT
SUT VIC AB 0 CT1 27 (SUTURE) ×2
SUT VIC AB 0 CT1 27XBRD ANBCTR (SUTURE) ×1 IMPLANT
SUT VIC AB 1 CT1 27 (SUTURE) ×2
SUT VIC AB 1 CT1 27XBRD ANBCTR (SUTURE) ×1 IMPLANT
SUT VIC AB 2-0 CT1 27 (SUTURE) ×2
SUT VIC AB 2-0 CT1 TAPERPNT 27 (SUTURE) ×1 IMPLANT
TOWEL OR 17X24 6PK STRL BLUE (TOWEL DISPOSABLE) ×2 IMPLANT
TOWEL OR 17X26 10 PK STRL BLUE (TOWEL DISPOSABLE) ×2 IMPLANT
TRAY CATH 16FR W/PLASTIC CATH (SET/KITS/TRAYS/PACK) IMPLANT
TRAY FOLEY MTR SLVR 16FR STAT (SET/KITS/TRAYS/PACK) IMPLANT
WATER STERILE IRR 1000ML POUR (IV SOLUTION) ×4 IMPLANT

## 2018-03-25 NOTE — Evaluation (Signed)
Physical Therapy Evaluation Patient Details Name: Leslie Wright MRN: [id number] DOB: Mar 07, 1966 Today's Date: 03/25/2018   History of Present Illness  Pt is a 52 y/o female s/p elective L THA, direct anterior approach. PMH includes obesity, prediabetes, HTN, OSA, and carpal tunnel release.   Clinical Impression  Pt is s/p surgery above with deficits below. Pt presenting with increased nausea and decreased sensation, therefore mobility limited to standing at EOB. Required min A for mobility with RW. Notified RN of nausea. Reviewed supine HEP with pt. Will continue to follow acutely to maximize functional mobility independence and safety.     Follow Up Recommendations Follow surgeon's recommendation for DC plan and follow-up therapies;Supervision for mobility/OOB    Equipment Recommendations  3in1 (PT)    Recommendations for Other Services       Precautions / Restrictions Precautions Precautions: None Precaution Comments: Reviewed supine HEP and knee precautions.  Restrictions Weight Bearing Restrictions: Yes RLE Weight Bearing: Weight bearing as tolerated LLE Weight Bearing: Weight bearing as tolerated      Mobility  Bed Mobility Overal bed mobility: Needs Assistance Bed Mobility: Supine to Sit     Supine to sit: Min assist     General bed mobility comments: Min A for LLE assist. Increased time required.   Transfers Overall transfer level: Needs assistance Equipment used: Rolling walker (2 wheeled) Transfers: Sit to/from Stand Sit to Stand: Min assist         General transfer comment: Min A for lift assist and steadying. Pt still with decreased sensation, so further mobility deferred. Verbal cues for safe hand placement. Pt also with increased nausea, however, did request to sit at EOB. Notified RN, and RN to address nausea.   Ambulation/Gait                Stairs            Wheelchair Mobility    Modified Rankin (Stroke Patients Only)        Balance Overall balance assessment: Needs assistance Sitting-balance support: No upper extremity supported;Feet supported Sitting balance-Leahy Scale: Good     Standing balance support: Bilateral upper extremity supported;During functional activity Standing balance-Leahy Scale: Poor Standing balance comment: Reliant on BUE support and external support.                              Pertinent Vitals/Pain Pain Assessment: 0-10 Pain Score: 8  Pain Location: L hip  Pain Descriptors / Indicators: Aching;Operative site guarding Pain Intervention(s): Limited activity within patient's tolerance;Monitored during session;Repositioned    Home Living Family/patient expects to be discharged to:: Private residence Living Arrangements: Other (Comment)(aunt ) Available Help at Discharge: Family;Available 24 hours/day Type of Home: House Home Access: Stairs to enter Entrance Stairs-Rails: Right Entrance Stairs-Number of Steps: 3 Home Layout: One level Home Equipment: Walker - 2 wheels;Shower seat      Prior Function Level of Independence: Independent with assistive device(s)         Comments: Used cane for ambulation      Hand Dominance        Extremity/Trunk Assessment   Upper Extremity Assessment Upper Extremity Assessment: Defer to OT evaluation    Lower Extremity Assessment Lower Extremity Assessment: LLE deficits/detail LLE Deficits / Details: Reports decreased sensation. Deficits consistent with post op pain and weakness. Able to perform ther ex below.     Cervical / Trunk Assessment Cervical / Trunk Assessment: Normal  Communication  Communication: No difficulties  Cognition Arousal/Alertness: Awake/alert Behavior During Therapy: WFL for tasks assessed/performed Overall Cognitive Status: Within Functional Limits for tasks assessed                                        General Comments General comments (skin integrity, edema,  etc.): Pt's friend and sister in law present.     Exercises Total Joint Exercises Ankle Circles/Pumps: AROM;Both;20 reps Quad Sets: AROM;Left;10 reps Heel Slides: AROM;Left;10 reps   Assessment/Plan    PT Assessment Patient needs continued PT services  PT Problem List Decreased strength;Decreased range of motion;Decreased balance;Decreased activity tolerance;Decreased mobility;Decreased knowledge of use of DME;Decreased knowledge of precautions;Pain;Impaired sensation       PT Treatment Interventions DME instruction;Gait training;Stair training;Functional mobility training;Balance training;Therapeutic activities;Therapeutic exercise;Patient/family education    PT Goals (Current goals can be found in the Care Plan section)  Acute Rehab PT Goals Patient Stated Goal: to feel better  PT Goal Formulation: With patient Time For Goal Achievement: 04/08/18 Potential to Achieve Goals: Good    Frequency 7X/week   Barriers to discharge        Co-evaluation               AM-PAC PT "6 Clicks" Daily Activity  Outcome Measure Difficulty turning over in bed (including adjusting bedclothes, sheets and blankets)?: A Little Difficulty moving from lying on back to sitting on the side of the bed? : Unable Difficulty sitting down on and standing up from a chair with arms (e.g., wheelchair, bedside commode, etc,.)?: Unable Help needed moving to and from a bed to chair (including a wheelchair)?: A Little Help needed walking in hospital room?: A Lot Help needed climbing 3-5 steps with a railing? : A Lot 6 Click Score: 12    End of Session Equipment Utilized During Treatment: Gait belt Activity Tolerance: Treatment limited secondary to medical complications (Comment)(nauesea/numbness ) Patient left: in bed;with call bell/phone within reach(sitting EOB ) Nurse Communication: Mobility status(Pt sitting at EOB; pt with increased nausea ) PT Visit Diagnosis: Other abnormalities of gait and  mobility (R26.89);Pain Pain - Right/Left: Left Pain - part of body: Hip    Time: 4081-4481 PT Time Calculation (min) (ACUTE ONLY): 23 min   Charges:   PT Evaluation $PT Eval Moderate Complexity: 1 Mod PT Treatments $Therapeutic Activity: 8-22 mins   PT G Codes:        Leighton Ruff, PT, DPT  Acute Rehabilitation Services  Pager: [phone]   Rudean Hitt 03/25/2018, 6:46 PM

## 2018-03-25 NOTE — Op Note (Signed)
NAME: Leslie Wright, Leslie Wright MEDICAL RECORD [id number] ACCOUNT [id number] DATE OF BIRTH:[birth year] FACILITY: [facility] LOCATION: [facility] PHYSICIAN:Heather Mckendree Kerry Fort, MD  OPERATIVE REPORT  DATE OF PROCEDURE:  03/25/2018  PREOPERATIVE DIAGNOSIS:  Primary osteoarthritis and degenerative joint disease, left hip and knee.  POSTOPERATIVE DIAGNOSIS:  Primary osteoarthritis and degenerative joint disease, left hip and knee.   PROCEDURE:  Left total hip arthroplasty through direct anterior approach.  IMPLANTS:  DePuy Sector Gription acetabular component size 52, size 36+0 polyethylene liner, size 11 Corail femoral component with standard offset, size 36+5 ceramic hip ball.  SURGEON:  Lind Guest. Ninfa Linden, MD  ASSISTANT:  Erskine Emery, PA-C  ANESTHESIA:  Spinal.  ANTIBIOTICS:  2 grams IV Ancef.  ESTIMATED BLOOD LOSS:  300 mL.  COMPLICATIONS:  None.  INDICATIONS:  The patient is a 52 year old female well known to me.  I have actually known her for about 15 years, having worked with her.  She used to be significantly morbidly obese but has lost a lot of weight over time.  Over the last 3 years she has  had significant worsening left hip pain and the left leg has gotten shorter as well.  X-rays show severe degenerative changes in her left hip.  There is loss of her leg length and height as well with severe arthritic changes in that hip.  At this point,  she has tried and failed all forms of conservative treatment.  She does wish to proceed with a total hip arthroplasty and we have recommended this to her.  She understands fully the risk of acute blood loss anemia, nerve or vessel injury, fracture,  infection, dislocation, DVT.  She understands her goals are to decrease pain, improve mobility and overall improved quality of life.  DESCRIPTION OF PROCEDURE:  After informed consent was obtained, the left hip was marked.  She was brought to the operating room, sat up on a stretcher for spinal  anesthesia to be obtained.  She was then laid supine on the stretcher.  Foley catheter was  placed.  We did measure leg length again.  She is significantly shorter on that left side than the right.  Traction boots were placed on both her feet.  Next, she was placed supine on the Hana fracture table with a perineal post in place and both legs in  a skeletal traction device with no traction applied.  Her left operative hip was prepped and draped with DuraPrep and sterile drapes.  A time-out was called and she was identified by correct operation and correct left hip.  We then made an incision just  inferior and posterior to the anterior superior iliac spine and carried this obliquely down the leg.  We dissected down to the tensor fascia lata muscle.  The tensor fascia was then divided longitudinally to proceed with a right anterior approach to the  hip.  We identified and cauterized circumflex vessels and identified the hip capsule.  We opened up the capsule in a tight format finding a large joint effusion and significant arthritis throughout her left hip.  We placed a Cobra retractor on the  medial and lateral femoral neck and then made our femoral neck cut with an oscillating saw proximal to the lesser trochanter and completed this with the osteotome.  We placed a corkscrew guide in the femoral head and removed the femoral head in its  entirety.  We found a large section devoid of cartilage completely with flattening.  We then cleaned and removed the acetabular labrum and  other debris away from the acetabulum.  I placed a bent Hohmann over the medial acetabular rim and then began  reaming under direct visualization from a size 43 reamer in stepwise increments up to a size 51 with all reamers under direct visualization.  The last reamer under direct fluoroscopy, so we could see our depth of reaming, inclination and anteversion.  We  then placed the real DePuy Sector Gription acetabular component size 52 and a  36+0 neutral polyethylene liner for that size 52 acetabular component.  Attention was then turned to the femur.  With the leg externally rotated to 120 degrees extended and  adducted we were able to place a Mueller retractor medially and a Hohmann above the greater trochanter.  We used a box cutting osteotome to enter the femoral canal and a rongeur to lateralize and then began broaching from a size 8 broach using the Corail  broaching system up to a size 11.  With a size 11 we had a good fill.  We trialed a standard offset femoral neck and with a 36+1.5 hip ball, reduced this in the acetabulum.  We felt like we needed just a little bit more offset and leg length, especially  with her starting off the short as she did.  We then dislocated the hip and removed the trial components.  We placed the real Corail femoral component size 11 with standard offset and the real 36+5 ceramic hip ball, reduced this in the acetabulum and it  was significantly tight, but we knew that was going to be the case given the fact that we had to overcome the significant leg length discrepancy.  We put her through a range of motion.  She was stable and we verified this under fluoroscopy.  We then  irrigated the soft tissues with normal saline solution using pulsatile lavage.  Closed the joint capsule with interrupted #1 Ethibond suture, followed by running #1 Vicryl in the tensor fascia, 0 Vicryl in the deep tissue, 2-0 Vicryl subcutaneous tissue  and interrupted 2-0 nylon in the skin.  Xeroform well-padded sterile dressing was applied.  She was taken off the Hana table and taken to recovery room in stable condition.  All final counts were correct.  There were no complications noted.  Of note, Benita Stabile, PA-C, assisted in the entire case.  His assistance was crucial for facilitating all aspects of this case.  TN/NUANCE  D:03/25/2018 T:03/25/2018 JOB:[id number]

## 2018-03-25 NOTE — Transfer of Care (Signed)
Immediate Anesthesia Transfer of Care Note  Patient: Leslie Wright  Procedure(s) Performed: LEFT TOTAL HIP ARTHROPLASTY ANTERIOR APPROACH (Left Hip)  Patient Location: PACU  Anesthesia Type:MAC combined with regional for post-op pain  Level of Consciousness: awake and patient cooperative  Airway & Oxygen Therapy: Patient Spontanous Breathing  Post-op Assessment: Report given to RN and Post -op Vital signs reviewed and stable  Post vital signs: Reviewed and stable  Last Vitals:  Vitals Value Taken Time  BP 105/61 03/25/2018  1:30 PM  Temp    Pulse 65 03/25/2018  1:30 PM  Resp 13 03/25/2018  1:30 PM  SpO2 99 % 03/25/2018  1:30 PM  Vitals shown include unvalidated device data.  Last Pain:  Vitals:   03/25/18 1045  TempSrc:   PainSc: 4          Complications: No apparent anesthesia complications

## 2018-03-25 NOTE — Progress Notes (Signed)
Orthopedic Tech Progress Note Patient Details:  Leslie Wright Nov 06, 1965 [id number]  Patient ID: Ladona Ridgel, female   DOB: [birth year], 52 y.o.   MRN: [id number] [facility]  Karolee Stamps 03/25/2018, 8:30 PM

## 2018-03-25 NOTE — Brief Op Note (Signed)
03/25/2018  1:06 PM  PATIENT:  Ladona Ridgel  52 y.o. female  PRE-OPERATIVE DIAGNOSIS:  left hip osteoarthritis  POST-OPERATIVE DIAGNOSIS:  left hip osteoarthritis  PROCEDURE:  Procedure(s): LEFT TOTAL HIP ARTHROPLASTY ANTERIOR APPROACH (Left)  SURGEON:  Surgeon(s) and Role:    Mcarthur Rossetti, MD - Primary  PHYSICIAN ASSISTANT: Benita Stabile, PA-C  ANESTHESIA:   spinal  EBL:  300 mL   COUNTS:  YES  DICTATION: .Other Dictation: Dictation Number [id number]  PLAN OF CARE: Admit to inpatient   PATIENT DISPOSITION:  PACU - hemodynamically stable.   Delay start of Pharmacological VTE agent (>24hrs) due to surgical blood loss or risk of bleeding: no

## 2018-03-25 NOTE — H&P (Signed)
TOTAL HIP ADMISSION H&P  Patient is admitted for left total hip arthroplasty.  Subjective:  Chief Complaint: left hip pain  HPI: Leslie Wright, 52 y.o. female, has a history of pain and functional disability in the left hip(s) due to arthritis and patient has failed non-surgical conservative treatments for greater than 12 weeks to include NSAID's and/or analgesics, corticosteriod injections, weight reduction as appropriate and activity modification.  Onset of symptoms was gradual starting 2 years ago with gradually worsening course since that time.The patient noted no past surgery on the left hip(s).  Patient currently rates pain in the left hip at 10 out of 10 with activity. Patient has night pain, worsening of pain with activity and weight bearing, trendelenberg gait and pain that interfers with activities of daily living. Patient has evidence of subchondral cysts, subchondral sclerosis, periarticular osteophytes and joint space narrowing by imaging studies. This condition presents safety issues increasing the risk of falls.  There is no current active infection.  Patient Active Problem List   Diagnosis Date Noted  . Unilateral primary osteoarthritis, left hip 03/25/2018  . Hyperlipidemia 04/11/2017  . Class 3 obesity with serious comorbidity and body mass index (BMI) of 40.0 to 44.9 in adult 04/11/2017  . Prediabetes 02/04/2017  . Mixed hyperlipidemia 01/09/2017  . Insulin resistance 11/27/2016  . Essential hypertension 10/25/2016  . Morbid obesity ([place]) 10/25/2016  . Vitamin D deficiency 10/25/2016   Past Medical History:  Diagnosis Date  . Acid reflux   . Anemia   . Back pain   . Gallbladder problem   . High blood pressure   . Hip pain   . Pre-diabetes   . Shortness of breath   . Sleep apnea   . Swelling     Past Surgical History:  Procedure Laterality Date  . CARPAL TUNNEL RELEASE    . GALLBLADDER SURGERY     April 2012  . LASIK     2000    Current  Facility-Administered Medications  Medication Dose Route Frequency Provider Last Rate Last Dose  . ceFAZolin (ANCEF) 3 g in dextrose 5 % 50 mL IVPB  3 g Intravenous To [facility] Mcarthur Rossetti, MD      . chlorhexidine (HIBICLENS) 4 % liquid 4 application  60 mL Topical Once Erskine Emery W, PA-C      . tranexamic acid (CYKLOKAPRON) 1,000 mg in sodium chloride 0.9 % 100 mL IVPB  1,000 mg Intravenous To OR Mcarthur Rossetti, MD       No Known Allergies  Social History   Tobacco Use  . Smoking status: Never Smoker  . Smokeless tobacco: Never Used  Substance Use Topics  . Alcohol use: Not Currently    Family History  Problem Relation Age of Onset  . Diabetes Mother   . Thyroid disease Mother   . Liver disease Mother   . Obesity Mother   . Hypertension Father   . Hyperlipidemia Father   . Heart disease Father   . Stroke Father   . Obesity Father      Review of Systems  Musculoskeletal: Positive for joint pain.  All other systems reviewed and are negative.   Objective:  Physical Exam  Constitutional: She is oriented to person, place, and time. She appears well-developed and well-nourished.  HENT:  Head: Normocephalic and atraumatic.  Eyes: Pupils are equal, round, and reactive to light. EOM are normal.  Neck: Normal range of motion. Neck supple.  Cardiovascular: Normal rate and regular rhythm.  Respiratory: Effort normal and breath sounds normal.  GI: Soft. Bowel sounds are normal.  Musculoskeletal:       Left hip: She exhibits decreased range of motion, decreased strength, tenderness and bony tenderness.  Neurological: She is alert and oriented to person, place, and time.  Skin: Skin is warm and dry.  Psychiatric: She has a normal mood and affect.    Vital signs in last 24 hours: Temp:  [97.8 F (36.6 C)] 97.8 F (36.6 C) (06/25 0929) Pulse Rate:  [76] 76 (06/25 0929) Resp:  [18] 18 (06/25 0929) BP: (153)/(86) 153/86 (06/25 0929)  Labs:   Estimated  body mass index is 43.09 kg/m as calculated from the following:   Height as of 03/18/18: 5\' 6"  (1.676 m).   Weight as of 03/18/18: 267 lb (121.1 kg).   Imaging Review Plain radiographs demonstrate severe degenerative joint disease of the left hip(s). The bone quality appears to be good for age and reported activity level.    Preoperative templating of the joint replacement has been completed, documented, and submitted to the Operating Room personnel in order to optimize intra-operative equipment management.     Assessment/Plan:  End stage arthritis, left hip(s)  The patient history, physical examination, clinical judgement of the provider and imaging studies are consistent with end stage degenerative joint disease of the left hip(s) and total hip arthroplasty is deemed medically necessary. The treatment options including medical management, injection therapy, arthroscopy and arthroplasty were discussed at length. The risks and benefits of total hip arthroplasty were presented and reviewed. The risks due to aseptic loosening, infection, stiffness, dislocation/subluxation,  thromboembolic complications and other imponderables were discussed.  The patient acknowledged the explanation, agreed to proceed with the plan and consent was signed. Patient is being admitted for inpatient treatment for surgery, pain control, PT, OT, prophylactic antibiotics, VTE prophylaxis, progressive ambulation and ADL's and discharge planning.The patient is planning to be discharged home with home health services

## 2018-03-25 NOTE — Anesthesia Preprocedure Evaluation (Signed)
Anesthesia Evaluation  Patient identified by MRN, date of birth, ID band Patient awake    Reviewed: Allergy & Precautions, NPO status , Patient's Chart, lab work & pertinent test results  History of Anesthesia Complications Negative for: history of anesthetic complications  Airway Mallampati: II  TM Distance: >3 FB Neck ROM: Full    Dental  (+) Teeth Intact, Partial Upper   Pulmonary shortness of breath, sleep apnea ,    breath sounds clear to auscultation       Cardiovascular hypertension, Pt. on medications (-) angina(-) Past MI and (-) CHF  Rhythm:Regular     Neuro/Psych negative neurological ROS  negative psych ROS   GI/Hepatic Neg liver ROS, GERD  ,  Endo/Other  Morbid obesity  Renal/GU Renal InsufficiencyRenal disease     Musculoskeletal  (+) Arthritis ,   Abdominal   Peds  Hematology negative hematology ROS (+)   Anesthesia Other Findings   Reproductive/Obstetrics                             Anesthesia Physical Anesthesia Plan  ASA: III  Anesthesia Plan: Spinal and MAC   Post-op Pain Management:    Induction:   PONV Risk Score and Plan: 2 and Ondansetron and Treatment may vary due to age or medical condition  Airway Management Planned: Nasal Cannula  Additional Equipment: None  Intra-op Plan:   Post-operative Plan:   Informed Consent: I have reviewed the patients History and Physical, chart, labs and discussed the procedure including the risks, benefits and alternatives for the proposed anesthesia with the patient or authorized representative who has indicated his/her understanding and acceptance.   Dental advisory given  Plan Discussed with: CRNA and Surgeon  Anesthesia Plan Comments:         Anesthesia Quick Evaluation

## 2018-03-26 ENCOUNTER — Other Ambulatory Visit: Payer: Self-pay

## 2018-03-26 ENCOUNTER — Encounter (HOSPITAL_COMMUNITY): Payer: Self-pay | Admitting: Orthopaedic Surgery

## 2018-03-26 LAB — CBC
HCT: 34.2 % — ABNORMAL LOW (ref 36.0–46.0)
Hemoglobin: 10.9 g/dL — ABNORMAL LOW (ref 12.0–15.0)
MCH: 27.9 pg (ref 26.0–34.0)
MCHC: 31.9 g/dL (ref 30.0–36.0)
MCV: 87.5 fL (ref 78.0–100.0)
PLATELETS: 199 10*3/uL (ref 150–400)
RBC: 3.91 MIL/uL (ref 3.87–5.11)
RDW: 14.3 % (ref 11.5–15.5)
WBC: 9.6 10*3/uL (ref 4.0–10.5)

## 2018-03-26 LAB — BASIC METABOLIC PANEL
Anion gap: 8 (ref 5–15)
BUN: 10 mg/dL (ref 6–20)
CO2: 27 mmol/L (ref 22–32)
Calcium: 8.4 mg/dL — ABNORMAL LOW (ref 8.9–10.3)
Chloride: 102 mmol/L (ref 98–111)
Creatinine, Ser: 1.12 mg/dL — ABNORMAL HIGH (ref 0.44–1.00)
GFR calc Af Amer: >60 mL/min (ref >60)
GFR, EST NON AFRICAN AMERICAN: 55 mL/min — AB (ref >60)
GLUCOSE: 116 mg/dL — AB (ref 70–99)
POTASSIUM: 3.8 mmol/L (ref 3.5–5.1)
Sodium: 137 mmol/L (ref 135–145)

## 2018-03-26 NOTE — Progress Notes (Signed)
Physical Therapy Treatment Patient Details Name: Leslie Wright MRN: [id number] DOB: 03-31-1966 Today's Date: 03/26/2018    History of Present Illness Pt is a 52 y/o female s/p elective L THA, direct anterior approach. PMH includes obesity, prediabetes, HTN, OSA, and carpal tunnel release.     PT Comments    Pt performed bed mobility, transfers and gait training with improved ease and decreased assistance.  Plan for progression of HEP this pm in addition to gait training.  OT dove tailed session this am.    Follow Up Recommendations  Follow surgeon's recommendation for DC plan and follow-up therapies;Supervision for mobility/OOB     Equipment Recommendations  3in1 (PT)    Recommendations for Other Services       Precautions / Restrictions Precautions Precautions: None Precaution Comments: Reviewed supine HEP and knee precautions.  Restrictions Weight Bearing Restrictions: Yes LLE Weight Bearing: Weight bearing as tolerated    Mobility  Bed Mobility Overal bed mobility: Needs Assistance Bed Mobility: Supine to Sit     Supine to sit: Supervision     General bed mobility comments: Increased time and effort but able to complete.    Transfers Overall transfer level: Needs assistance Equipment used: Rolling walker (2 wheeled) Transfers: Sit to/from Stand Sit to Stand: Min assist         General transfer comment: Cues for hand placement to and from seated surface.  Pt required rocking mometum with cues for forward weight shifting.    Ambulation/Gait Ambulation/Gait assistance: Min guard Gait Distance (Feet): 60 Feet Assistive device: Rolling walker (2 wheeled) Gait Pattern/deviations: Step-through pattern;Antalgic;Trunk flexed     General Gait Details: Pt performed increased activity and required cues for equal step length and improved posture.     Stairs             Wheelchair Mobility    Modified Rankin (Stroke Patients Only)       Balance  Overall balance assessment: Needs assistance Sitting-balance support: No upper extremity supported;Feet supported Sitting balance-Leahy Scale: Good     Standing balance support: Bilateral upper extremity supported;During functional activity Standing balance-Leahy Scale: Poor                              Cognition Arousal/Alertness: Awake/alert Behavior During Therapy: WFL for tasks assessed/performed Overall Cognitive Status: Within Functional Limits for tasks assessed                                        Exercises      General Comments        Pertinent Vitals/Pain Pain Assessment: 0-10 Pain Score: 8  Pain Location: L hip  Pain Descriptors / Indicators: Aching;Operative site guarding Pain Intervention(s): Monitored during session;Repositioned    Home Living Family/patient expects to be discharged to:: Private residence Living Arrangements: Other (Comment)(aunt) Available Help at Discharge: Family;Available 24 hours/day Type of Home: House Home Access: Stairs to enter Entrance Stairs-Rails: Right Home Layout: One level Home Equipment: Walker - 2 wheels;Shower seat      Prior Function Level of Independence: Independent with assistive device(s)      Comments: Used cane for ambulation    PT Goals (current goals can now be found in the care plan section) Acute Rehab PT Goals Patient Stated Goal: to feel better  Potential to Achieve Goals: Good Progress towards PT goals:  Progressing toward goals    Frequency    7X/week      PT Plan Current plan remains appropriate    Co-evaluation              AM-PAC PT "6 Clicks" Daily Activity  Outcome Measure  Difficulty turning over in bed (including adjusting bedclothes, sheets and blankets)?: A Little Difficulty moving from lying on back to sitting on the side of the bed? : Unable Difficulty sitting down on and standing up from a chair with arms (e.g., wheelchair, bedside commode,  etc,.)?: Unable Help needed moving to and from a bed to chair (including a wheelchair)?: A Little Help needed walking in hospital room?: A Lot Help needed climbing 3-5 steps with a railing? : A Lot 6 Click Score: 12    End of Session Equipment Utilized During Treatment: Gait belt Activity Tolerance: Patient tolerated treatment well Patient left: in chair;with call bell/phone within reach(in bathroom with OT.  ) Nurse Communication: Mobility status PT Visit Diagnosis: Other abnormalities of gait and mobility (R26.89);Pain Pain - Right/Left: Left Pain - part of body: Hip     Time: 0998-3382 PT Time Calculation (min) (ACUTE ONLY): 14 min  Charges:  $Gait Training: 8-22 mins                    G Codes:       Governor Rooks, PTA pager [phone]    Cristela Blue 03/26/2018, 3:13 PM

## 2018-03-26 NOTE — Evaluation (Signed)
Occupational Therapy Evaluation Patient Details Name: Leslie Wright MRN: [id number] DOB: 15-Aug-1966 Today's Date: 03/26/2018    History of Present Illness Pt is a 52 y/o female s/p elective L THA, direct anterior approach. PMH includes obesity, prediabetes, HTN, OSA, and carpal tunnel release.    Clinical Impression   Pt with decline in function and safety with ADLs and ADL mobility with decreased strength, balance and endurance. Pt pt would benefit from acute OT services to address impairments to maximize level of function and safety    Follow Up Recommendations  Follow surgeon's recommendation for DC plan and follow-up therapies;Supervision - Intermittent    Equipment Recommendations  3 in 1 bedside commode    Recommendations for Other Services       Precautions / Restrictions Precautions Precautions: None Precaution Comments: Reviewed supine HEP and knee precautions.  Restrictions Weight Bearing Restrictions: Yes LLE Weight Bearing: Weight bearing as tolerated      Mobility Bed Mobility               General bed mobility comments: pt up ambulating with PT upon arrival  Transfers Overall transfer level: Needs assistance Equipment used: Rolling walker (2 wheeled) Transfers: Sit to/from Stand Sit to Stand: Min assist              Balance Overall balance assessment: Needs assistance Sitting-balance support: No upper extremity supported;Feet supported Sitting balance-Leahy Scale: Good     Standing balance support: Bilateral upper extremity supported;During functional activity Standing balance-Leahy Scale: Poor                             ADL either performed or assessed with clinical judgement   ADL Overall ADL's : Needs assistance/impaired Eating/Feeding: Independent;Sitting   Grooming: Min guard;Standing;Wash/dry hands;Wash/dry face;Oral care   Upper Body Bathing: Min guard;Standing   Lower Body Bathing: Moderate assistance;Sit  to/from stand;Sitting/lateral leans   Upper Body Dressing : Min guard;Standing   Lower Body Dressing: Moderate assistance;Sit to/from stand;Sitting/lateral leans   Toilet Transfer: Minimal assistance;Ambulation;Comfort height toilet;RW   Toileting- Clothing Manipulation and Hygiene: Minimal assistance;Sit to/from stand   Tub/ Shower Transfer: Minimal assistance;Ambulation;Rolling walker;3 in 1   Functional mobility during ADLs: Minimal assistance;Rolling walker       Vision Patient Visual Report: No change from baseline       Perception     Praxis      Pertinent Vitals/Pain Pain Assessment: 0-10 Pain Score: 5  Pain Location: L hip  Pain Descriptors / Indicators: Aching;Operative site guarding Pain Intervention(s): Limited activity within patient's tolerance;Monitored during session;Premedicated before session;Repositioned     Hand Dominance Right   Extremity/Trunk Assessment Upper Extremity Assessment Upper Extremity Assessment: Overall WFL for tasks assessed   Lower Extremity Assessment Lower Extremity Assessment: Defer to PT evaluation   Cervical / Trunk Assessment Cervical / Trunk Assessment: Normal   Communication Communication Communication: No difficulties   Cognition Arousal/Alertness: Awake/alert Behavior During Therapy: WFL for tasks assessed/performed Overall Cognitive Status: Within Functional Limits for tasks assessed                                     General Comments       Exercises     Shoulder Instructions      Home Living Family/patient expects to be discharged to:: Private residence Living Arrangements: Other (Comment)(aunt) Available Help at Discharge: Family;Available 24  hours/day Type of Home: House Home Access: Stairs to enter [employer] of Steps: 3 Entrance Stairs-Rails: Right Home Layout: One level     Bathroom Shower/Tub: Occupational psychologist: Standard     Home Equipment:  Environmental consultant - 2 wheels;Shower seat          Prior Functioning/Environment Level of Independence: Independent with assistive device(s)        Comments: Used cane for ambulation         OT Problem List: Decreased activity tolerance;Decreased knowledge of use of DME or AE;Impaired balance (sitting and/or standing);Pain      OT Treatment/Interventions: Self-care/ADL training;Therapeutic exercise;DME and/or AE instruction;Neuromuscular education;Therapeutic activities;Patient/family education    OT Goals(Current goals can be found in the care plan section) Acute Rehab OT Goals Patient Stated Goal: to feel better  OT Goal Formulation: With patient/family Time For Goal Achievement: 04/09/18 Potential to Achieve Goals: Good ADL Goals Pt Will Perform Grooming: with set-up;with supervision;standing;with caregiver independent in assisting Pt Will Perform Upper Body Bathing: with supervision;with set-up;standing;with caregiver independent in assisting Pt Will Perform Lower Body Bathing: with min assist;with caregiver independent in assisting;sit to/from stand Pt Will Perform Upper Body Dressing: with supervision;with set-up;standing;with caregiver independent in assisting Pt Will Perform Lower Body Dressing: with min assist;sit to/from stand;with caregiver independent in assisting Pt Will Transfer to Toilet: with min guard assist;with supervision;ambulating;grab bars Pt Will Perform Toileting - Clothing Manipulation and hygiene: with min guard assist;sit to/from stand;with caregiver independent in assisting Pt Will Perform Tub/Shower Transfer: with min guard assist;with supervision;ambulating;shower seat;3 in 1;with caregiver independent in assisting  OT Frequency: Min 2X/week   Barriers to D/C:    no barriers, will have assist from family at home       Co-evaluation              AM-PAC PT "6 Clicks" Daily Activity     Outcome Measure Help from another person eating meals?:  None Help from another person taking care of personal grooming?: None Help from another person toileting, which includes using toliet, bedpan, or urinal?: A Little Help from another person bathing (including washing, rinsing, drying)?: A Lot Help from another person to put on and taking off regular upper body clothing?: A Little Help from another person to put on and taking off regular lower body clothing?: A Lot 6 Click Score: 18   End of Session Equipment Utilized During Treatment: Gait belt;Rolling walker;Other (comment)(3 in 1)  Activity Tolerance: Patient tolerated treatment well Patient left: in chair;with call bell/phone within reach;with family/visitor present  OT Visit Diagnosis: Unsteadiness on feet (R26.81);Other abnormalities of gait and mobility (R26.89);Pain Pain - Right/Left: Left Pain - part of body: Hip                Time: 1137-1204 OT Time Calculation (min): 27 min Charges:  OT General Charges $OT Visit: 1 Visit OT Evaluation $OT Eval Moderate Complexity: 1 Mod OT Treatments $Therapeutic Activity: 8-22 mins G-Codes: OT G-codes **NOT FOR INPATIENT CLASS** Functional Assessment Tool Used: AM-PAC 6 Clicks Daily Activity     Britt Bottom 03/26/2018, 2:25 PM

## 2018-03-26 NOTE — Plan of Care (Signed)
  Problem: Education: Goal: Knowledge of General Education information will improve Outcome: Progressing Note:  POC and pain management reviewed with pt./mother.

## 2018-03-26 NOTE — Progress Notes (Signed)
Physical Therapy Treatment Patient Details Name: Leslie Wright MRN: [id number] DOB: 11-24-1965 Today's Date: 03/26/2018    History of Present Illness Pt is a 52 y/o female s/p elective L THA, direct anterior approach. PMH includes obesity, prediabetes, HTN, OSA, and carpal tunnel release.     PT Comments    Pt performed progression of gait training and functional mobility.  Pt able to progress to step through pattern with cues for gait symmetry.  Performed supine and seated exercises.  Pt will begin stair training in am.     Follow Up Recommendations  Follow surgeon's recommendation for DC plan and follow-up therapies;Supervision for mobility/OOB     Equipment Recommendations  3in1 (PT)    Recommendations for Other Services       Precautions / Restrictions Precautions Precautions: None Precaution Comments: Reviewed supine HEP and knee precautions.  Restrictions Weight Bearing Restrictions: Yes LLE Weight Bearing: Weight bearing as tolerated    Mobility  Bed Mobility Overal bed mobility: Needs Assistance Bed Mobility: Sit to Supine     Supine to sit: Supervision Sit to supine: Supervision   General bed mobility comments: Increased time and effort but able to complete.    Transfers Overall transfer level: Needs assistance Equipment used: Rolling walker (2 wheeled) Transfers: Sit to/from Stand Sit to Stand: Supervision         General transfer comment: Decreased assistance but remains to require cues for hand placement to and from seated surface.    Ambulation/Gait Ambulation/Gait assistance: Min guard Gait Distance (Feet): 180 Feet Assistive device: Rolling walker (2 wheeled) Gait Pattern/deviations: Step-through pattern;Antalgic;Trunk flexed     General Gait Details: Pt able to progress to step through pattern with cues for equal stride lengths, upper trunk control and pushing of RW vs.  picking up device and placing down.     Stairs              Wheelchair Mobility    Modified Rankin (Stroke Patients Only)       Balance Overall balance assessment: Needs assistance Sitting-balance support: No upper extremity supported;Feet supported Sitting balance-Leahy Scale: Good     Standing balance support: Bilateral upper extremity supported;During functional activity Standing balance-Leahy Scale: Poor Standing balance comment: Reliant on BUE support and external support.                             Cognition Arousal/Alertness: Awake/alert Behavior During Therapy: WFL for tasks assessed/performed Overall Cognitive Status: Within Functional Limits for tasks assessed                                        Exercises Total Joint Exercises Ankle Circles/Pumps: AROM;Both;20 reps;Supine Quad Sets: AROM;Left;10 reps;Supine Short Arc Quad: AROM;Left;10 reps;Supine Heel Slides: AROM;Left;10 reps;Supine Hip ABduction/ADduction: AROM;Left;10 reps;Supine Long Arc Quad: AROM;Left;10 reps;Seated    General Comments        Pertinent Vitals/Pain Pain Assessment: 0-10 Pain Score: 6  Pain Location: L hip  Pain Descriptors / Indicators: Aching;Operative site guarding Pain Intervention(s): Monitored during session;Repositioned;Ice applied    Home Living Family/patient expects to be discharged to:: Private residence Living Arrangements: Other (Comment)(aunt) Available Help at Discharge: Family;Available 24 hours/day Type of Home: House Home Access: Stairs to enter Entrance Stairs-Rails: Right Home Layout: One level Home Equipment: Walker - 2 wheels;Shower seat      Prior Function Level  of Independence: Independent with assistive device(s)      Comments: Used cane for ambulation    PT Goals (current goals can now be found in the care plan section) Acute Rehab PT Goals Patient Stated Goal: to feel better  Potential to Achieve Goals: Good Progress towards PT goals: Progressing toward goals     Frequency    7X/week      PT Plan Current plan remains appropriate    Co-evaluation              AM-PAC PT "6 Clicks" Daily Activity  Outcome Measure  Difficulty turning over in bed (including adjusting bedclothes, sheets and blankets)?: A Little Difficulty moving from lying on back to sitting on the side of the bed? : A Little Difficulty sitting down on and standing up from a chair with arms (e.g., wheelchair, bedside commode, etc,.)?: Unable Help needed moving to and from a bed to chair (including a wheelchair)?: A Little Help needed walking in hospital room?: A Little Help needed climbing 3-5 steps with a railing? : A Little 6 Click Score: 16    End of Session Equipment Utilized During Treatment: Gait belt Activity Tolerance: Patient tolerated treatment well Patient left: with call bell/phone within reach;in bed Nurse Communication: Mobility status PT Visit Diagnosis: Other abnormalities of gait and mobility (R26.89);Pain Pain - Right/Left: Left Pain - part of body: Hip     Time: 1638-4536 PT Time Calculation (min) (ACUTE ONLY): 26 min  Charges:  $Gait Training: 8-22 mins $Therapeutic Exercise: 8-22 mins                    G Codes:       Governor Rooks, PTA pager [phone]    Cristela Blue 03/26/2018, 3:51 PM

## 2018-03-26 NOTE — Progress Notes (Signed)
Subjective: 1 Day Post-Op Procedure(s) (LRB): LEFT TOTAL HIP ARTHROPLASTY ANTERIOR APPROACH (Left) Patient reports pain as moderate.    Objective: Vital signs in last 24 hours: Temp:  [97.2 F (36.2 C)-99 F (37.2 C)] 98.4 F (36.9 C) (06/26 0349) Pulse Rate:  [52-82] 68 (06/26 0349) Resp:  [10-19] 18 (06/26 0349) BP: (105-153)/(61-107) 105/62 (06/26 0349) SpO2:  [91 %-100 %] 96 % (06/26 0349)  Intake/Output from previous day: 06/25 0701 - 06/26 0700 In: 2450 [I.V.:2350] Out: 1465 [Urine:1165; Blood:300] Intake/Output this shift: No intake/output data recorded.  Recent Labs    03/26/18 0357  HGB 10.9*   Recent Labs    03/26/18 0357  WBC 9.6  RBC 3.91  HCT 34.2*  PLT 199   Recent Labs    03/26/18 0357  NA 137  K 3.8  CL 102  CO2 27  BUN 10  CREATININE 1.12*  GLUCOSE 116*  CALCIUM 8.4*   No results for input(s): LABPT, INR in the last 72 hours.  Sensation intact distally Intact pulses distally Dorsiflexion/Plantar flexion intact Incision: scant drainage  Assessment/Plan: 1 Day Post-Op Procedure(s) (LRB): LEFT TOTAL HIP ARTHROPLASTY ANTERIOR APPROACH (Left) Up with therapy    Mcarthur Rossetti 03/26/2018, 7:12 AM

## 2018-03-27 MED ORDER — METHOCARBAMOL 500 MG PO TABS: 500.0000 mg | ORAL_TABLET | Freq: Four times a day (QID) | ORAL | 0 refills | Status: DC | PRN

## 2018-03-27 MED ORDER — OXYCODONE HCL 5 MG PO TABS: 5.0000 mg | ORAL_TABLET | ORAL | 0 refills | Status: DC | PRN

## 2018-03-27 MED ORDER — ASPIRIN 81 MG PO CHEW: 81.0000 mg | CHEWABLE_TABLET | Freq: Two times a day (BID) | ORAL | 0 refills | Status: DC

## 2018-03-27 MED FILL — ASPIRIN CHILD 81 MG TAB CHE: 81 | 30 days supply | Qty: 60 | Fill #0

## 2018-03-27 MED FILL — METHOCARBAMOL 500 MG TABS: 500 | 12 days supply | Qty: 50 | Fill #0

## 2018-03-27 MED FILL — oxyCODONE HCL 5 MG TABS: 5 | 4 days supply | Qty: 50 | Fill #0

## 2018-03-27 NOTE — Discharge Instructions (Signed)

## 2018-03-27 NOTE — Progress Notes (Signed)
Physical Therapy Treatment Patient Details Name: Leslie Wright MRN: [id number] DOB: [birth year] Today's Date: 03/27/2018    History of Present Illness Pt is a 52 y/o female s/p elective L THA, direct anterior approach. PMH includes obesity, prediabetes, HTN, OSA, and carpal tunnel release.     PT Comments    Patient is making good progress with PT.  From a mobility standpoint anticipate patient will be ready for DC home when medically ready.    Follow Up Recommendations  Follow surgeon's recommendation for DC plan and follow-up therapies;Supervision for mobility/OOB     Equipment Recommendations  3in1 (PT)    Recommendations for Other Services       Precautions / Restrictions Precautions Precautions: None Precaution Comments: Reviewed supine HEP and knee precautions.  Restrictions Weight Bearing Restrictions: Yes RLE Weight Bearing: Weight bearing as tolerated LLE Weight Bearing: Weight bearing as tolerated    Mobility  Bed Mobility Overal bed mobility: Modified Independent Bed Mobility: Sit to Supine;Supine to Sit           General bed mobility comments: increased time and effort; use of leg lifter to bring L LE into bed with cues; no physical assist needed  Transfers Overall transfer level: Needs assistance Equipment used: Rolling walker (2 wheeled) Transfers: Sit to/from Stand Sit to Stand: Supervision         General transfer comment: safe hand placement demonstrated from EOB and recliner  Ambulation/Gait Ambulation/Gait assistance: Supervision Gait Distance (Feet): 250 Feet Assistive device: Rolling walker (2 wheeled) Gait Pattern/deviations: Step-through pattern;Antalgic;Trunk flexed;Decreased weight shift to left;Decreased stride length     General Gait Details: cues for posture and sequencing; improved step length symmetry with distance   Stairs Stairs: Yes Stairs assistance: Min guard Stair Management: One rail Right;Step to  pattern;Forwards Number of Stairs: 2 General stair comments: cues for sequencing and technique   Wheelchair Mobility    Modified Rankin (Stroke Patients Only)       Balance Overall balance assessment: Needs assistance Sitting-balance support: No upper extremity supported;Feet supported Sitting balance-Leahy Scale: Good     Standing balance support: During functional activity;Single extremity supported Standing balance-Leahy Scale: Poor                              Cognition Arousal/Alertness: Awake/alert Behavior During Therapy: WFL for tasks assessed/performed Overall Cognitive Status: Within Functional Limits for tasks assessed                                        Exercises      General Comments        Pertinent Vitals/Pain Pain Assessment: Faces Pain Score: 3  Faces Pain Scale: Hurts a little bit Pain Location: L thigh Pain Descriptors / Indicators: Sore Pain Intervention(s): Monitored during session;Premedicated before session;Repositioned    Home Living                      Prior Function            PT Goals (current goals can now be found in the care plan section) Acute Rehab PT Goals PT Goal Formulation: With patient Time For Goal Achievement: 04/08/18 Potential to Achieve Goals: Good Progress towards PT goals: Progressing toward goals    Frequency    7X/week      PT Plan Current plan remains  appropriate    Co-evaluation              AM-PAC PT "6 Clicks" Daily Activity  Outcome Measure  Difficulty turning over in bed (including adjusting bedclothes, sheets and blankets)?: A Little Difficulty moving from lying on back to sitting on the side of the bed? : A Little Difficulty sitting down on and standing up from a chair with arms (e.g., wheelchair, bedside commode, etc,.)?: A Lot Help needed moving to and from a bed to chair (including a wheelchair)?: A Little Help needed walking in hospital  room?: A Little Help needed climbing 3-5 steps with a railing? : A Little 6 Click Score: 17    End of Session Equipment Utilized During Treatment: Gait belt Activity Tolerance: Patient tolerated treatment well Patient left: with call bell/phone within reach;in bed;with SCD's reapplied Nurse Communication: Mobility status PT Visit Diagnosis: Other abnormalities of gait and mobility (R26.89);Pain Pain - Right/Left: Left Pain - part of body: Hip     Time: 1203-1233 PT Time Calculation (min) (ACUTE ONLY): 30 min  Charges:  $Gait Training: 8-22 mins $Therapeutic Activity: 8-22 mins                    G Codes:       Earney Navy, PTA Pager: [phone]     Darliss Cheney 03/27/2018, 1:06 PM

## 2018-03-27 NOTE — Progress Notes (Signed)
Received call from Cor[place] with [facility]; [employer] arranged; DME RW/ 3:1 ordered. Mindi Slicker [facility] [phone]

## 2018-03-27 NOTE — Discharge Summary (Signed)
Patient ID: NILI HONDA MRN: [id number] DOB/AGE: 53-Nov-1967 53 y.o.  Admit date: 03/25/2018 Discharge date: 03/27/2018  Admission Diagnoses:  Principal Problem:   Unilateral primary osteoarthritis, left hip Active Problems:   Status post total replacement of left hip   Discharge Diagnoses:  Same  Past Medical History:  Diagnosis Date  . Acid reflux   . Anemia   . Arthritis    "maybe in my fingers" (03/26/2018)  . Family history of adverse reaction to anesthesia    "brother, Otila Kluver, and my mom always get sick" (03/26/2018)  . Gallbladder problem   . High blood pressure   . High cholesterol   . Hip pain    "left; before OR" (03/26/2018)  . History of kidney stones   . OSA (obstructive sleep apnea)    "can't tolerate the mask" (03/26/2018)  . Pre-diabetes   . Shortness of breath   . Swelling of lower extremity    "that's why they put me on fluid pill" (03/26/2018)    Surgeries: Procedure(s): LEFT TOTAL HIP ARTHROPLASTY ANTERIOR APPROACH on 03/25/2018   Consultants:   Discharged Condition: Improved  Hospital Course: VALISSA LYVERS is an 52 y.o. female who was admitted 03/25/2018 for operative treatment ofUnilateral primary osteoarthritis, left hip. Patient has severe unremitting pain that affects sleep, daily activities, and work/hobbies. After pre-op clearance the patient was taken to the operating room on 03/25/2018 and underwent  Procedure(s): LEFT TOTAL HIP ARTHROPLASTY ANTERIOR APPROACH.    Patient was given perioperative antibiotics:  Anti-infectives (From admission, onward)   Start     Dose/Rate Route Frequency Ordered Stop   03/25/18 1730  ceFAZolin (ANCEF) IVPB 1 g/50 mL premix     1 g 100 mL/hr over 30 Minutes Intravenous Every 6 hours 03/25/18 1516 03/26/18 0051   03/25/18 0900  ceFAZolin (ANCEF) 3 g in dextrose 5 % 50 mL IVPB     3 g 100 mL/hr over 30 Minutes Intravenous To Short Stay 03/24/18 1329 03/25/18 1140       Patient was given sequential  compression devices, early ambulation, and chemoprophylaxis to prevent DVT.  Patient benefited maximally from hospital stay and there were no complications.    Recent vital signs:  Patient Vitals for the past 24 hrs:  BP Temp Temp src Pulse Resp SpO2  03/27/18 0553 (!) 101/59 100 F (37.8 C) Oral 68 - 91 %  03/26/18 2141 124/65 (!) 100.8 F (38.2 C) Oral 76 18 92 %  03/26/18 1705 - 99.3 F (37.4 C) Oral - - -  03/26/18 1511 135/74 100.1 F (37.8 C) Oral 82 16 96 %     Recent laboratory studies:  Recent Labs    03/26/18 0357  WBC 9.6  HGB 10.9*  HCT 34.2*  PLT 199  NA 137  K 3.8  CL 102  CO2 27  BUN 10  CREATININE 1.12*  GLUCOSE 116*  CALCIUM 8.4*     Discharge Medications:   Allergies as of 03/27/2018   No Known Allergies     Medication List    STOP taking these medications   ibuprofen 200 MG tablet Commonly known as:  ADVIL,MOTRIN     TAKE these medications   acetaminophen 650 MG CR tablet Commonly known as:  TYLENOL Take 650 mg by mouth See admin instructions. TAKE 1 TABLET (650 MG) BY MOUTH SCHEDULED IN THE MORNING, MAY REPEAT DOSE IN THE EVENING IF NEEDED FOR PAIN.   aspirin 81 MG chewable tablet Chew 1 tablet (81  mg total) by mouth 2 (two) times daily.   atorvastatin 20 MG tablet Commonly known as:  LIPITOR Take 1 tablet (20 mg total) by mouth daily.   B-12 2500 MCG Tabs Take 2,500 mcg by mouth daily.   hydrochlorothiazide 12.5 MG capsule Commonly known as:  MICROZIDE Take 1 capsule (12.5 mg total) by mouth daily.   metFORMIN 500 MG tablet Commonly known as:  GLUCOPHAGE Take 1 tablet (500 mg total) by mouth daily with breakfast.   methocarbamol 500 MG tablet Commonly known as:  ROBAXIN Take 1 tablet (500 mg total) by mouth every 6 (six) hours as needed for muscle spasms.   oxyCODONE 5 MG immediate release tablet Commonly known as:  Oxy IR/ROXICODONE Take 1-2 tablets (5-10 mg total) by mouth every 4 (four) hours as needed for moderate  pain (pain score 4-6).   Vitamin D (Ergocalciferol) 50000 units Caps capsule Commonly known as:  DRISDOL Take 1 capsule (50,000 Units total) by mouth every 7 (seven) days.            Durable Medical Equipment  (From admission, onward)        Start     Ordered   03/25/18 1517  DME 3 n 1  Once     03/25/18 1516   03/25/18 1517  DME Walker rolling  Once    Question:  Patient needs a walker to treat with the following condition  Answer:  Status post total replacement of left hip   03/25/18 1516      Diagnostic Studies: Dg Pelvis Portable  Result Date: 03/25/2018 CLINICAL DATA:  Post total left hip replacement EXAM: PORTABLE PELVIS 1-2 VIEWS COMPARISON:  Pelvis film of 09/04/2017 FINDINGS: The acetabular and femoral components of the left total hip replacement appear to be in good position and alignment on the image obtained. No complicating features are seen. The pelvic rami are intact. The right hip joint appears unremarkable. There is degenerative change noted in the lower lumbar spine. IMPRESSION: Left total hip replacement components in good position. No complicating features. Electronically Signed   By: Ivar Drape M.D.   On: 03/25/2018 14:33   Dg C-arm 1-60 Min  Result Date: 03/25/2018 CLINICAL DATA:  Left total hip arthroplasty. EXAM: OPERATIVE LEFT HIP (WITH PELVIS IF PERFORMED) TECHNIQUE: Fluoroscopic spot image(s) were submitted for interpretation post-operatively. COMPARISON:  Left hip radiographs 09/04/2017 FINDINGS: Two intraoperative fluoroscopic spot images are provided. A left total hip arthroplasty has been performed. The prosthetic components appear normally located on these limited single projection images. IMPRESSION: Intraoperative images during left total hip arthroplasty. Electronically Signed   By: Logan Bores M.D.   On: 03/25/2018 13:30   Dg Hip Operative Unilat With Pelvis Left  Result Date: 03/25/2018 CLINICAL DATA:  Left total hip arthroplasty. EXAM:  OPERATIVE LEFT HIP (WITH PELVIS IF PERFORMED) TECHNIQUE: Fluoroscopic spot image(s) were submitted for interpretation post-operatively. COMPARISON:  Left hip radiographs 09/04/2017 FINDINGS: Two intraoperative fluoroscopic spot images are provided. A left total hip arthroplasty has been performed. The prosthetic components appear normally located on these limited single projection images. IMPRESSION: Intraoperative images during left total hip arthroplasty. Electronically Signed   By: Logan Bores M.D.   On: 03/25/2018 13:30    Disposition: Discharge disposition: 01-Home or [place]    Mcarthur Rossetti, MD Follow up in 2 week(s).   Specialty:  Orthopedic Surgery Contact information: [place] [address] [phone]  Signed: Mcarthur Rossetti 03/27/2018, 7:08 AM

## 2018-03-27 NOTE — Progress Notes (Signed)
Subjective: 2 Days Post-Op Procedure(s) (LRB): LEFT TOTAL HIP ARTHROPLASTY ANTERIOR APPROACH (Left) Patient reports pain as moderate.    Objective: Vital signs in last 24 hours: Temp:  [99.3 F (37.4 C)-100.8 F (38.2 C)] 100 F (37.8 C) (06/27 0553) Pulse Rate:  [68-82] 68 (06/27 0553) Resp:  [16-18] 18 (06/26 2141) BP: (101-135)/(59-74) 101/59 (06/27 0553) SpO2:  [91 %-96 %] 91 % (06/27 0553)  Intake/Output from previous day: 06/26 0701 - 06/27 0700 In: 480 [P.O.:480] Out: -  Intake/Output this shift: No intake/output data recorded.  Recent Labs    03/26/18 0357  HGB 10.9*   Recent Labs    03/26/18 0357  WBC 9.6  RBC 3.91  HCT 34.2*  PLT 199   Recent Labs    03/26/18 0357  NA 137  K 3.8  CL 102  CO2 27  BUN 10  CREATININE 1.12*  GLUCOSE 116*  CALCIUM 8.4*   No results for input(s): LABPT, INR in the last 72 hours.  Sensation intact distally Intact pulses distally Dorsiflexion/Plantar flexion intact Incision: scant drainage   Assessment/Plan: 2 Days Post-Op Procedure(s) (LRB): LEFT TOTAL HIP ARTHROPLASTY ANTERIOR APPROACH (Left) D/C IV fluids Discharge home with home health today.    Mcarthur Rossetti 03/27/2018, 7:06 AM

## 2018-03-27 NOTE — Progress Notes (Signed)
Occupational Therapy Treatment Patient Details Name: Leslie Wright MRN: [id number] DOB: [birth year] Today's Date: 03/27/2018    History of present illness Pt is a 52 y/o female s/p elective L THA, direct anterior approach. PMH includes obesity, prediabetes, HTN, OSA, and carpal tunnel release.    OT comments  Pt making good progress with functional goals. Will d/c home today  Follow Up Recommendations  Follow surgeon's recommendation for DC plan and follow-up therapies;Supervision - Intermittent    Equipment Recommendations  3 in 1 bedside commode    Recommendations for Other Services      Precautions / Restrictions Precautions Precautions: None Precaution Comments: Reviewed supine HEP and knee precautions.  Restrictions Weight Bearing Restrictions: Yes RLE Weight Bearing: Weight bearing as tolerated LLE Weight Bearing: Weight bearing as tolerated       Mobility Bed Mobility               General bed mobility comments: pt up in recliner upon arrival  Transfers Overall transfer level: Needs assistance Equipment used: Rolling walker (2 wheeled) Transfers: Sit to/from Stand Sit to Stand: Supervision              Balance Overall balance assessment: Needs assistance Sitting-balance support: No upper extremity supported;Feet supported Sitting balance-Leahy Scale: Good     Standing balance support: Bilateral upper extremity supported;During functional activity Standing balance-Leahy Scale: Poor                             ADL either performed or assessed with clinical judgement   ADL Overall ADL's : Needs assistance/impaired     Grooming: Standing;Wash/dry hands;Wash/dry face;Oral care;Set up;Supervision/safety       Lower Body Bathing: Sit to/from stand;Sitting/lateral leans;Minimal assistance;With caregiver independent assisting       Lower Body Dressing: Sit to/from stand;Sitting/lateral leans;Minimal assistance;With caregiver  independent assisting   Toilet Transfer: Ambulation;Comfort height toilet;RW;Min guard;Supervision/safety   Toileting- Clothing Manipulation and Hygiene: Sit to/from stand;Min guard   Tub/ Shower Transfer: Ambulation;Rolling walker;3 in Psychologist, educational Details (indicate cue type and reason): pt educated on safe walkin shower transfer technique Functional mobility during ADLs: Rolling walker;Min guard;Supervision/safety       Vision Patient Visual Report: No change from baseline     Perception     Praxis      Cognition Arousal/Alertness: Awake/alert Behavior During Therapy: WFL for tasks assessed/performed Overall Cognitive Status: Within Functional Limits for tasks assessed                                          Exercises     Shoulder Instructions       General Comments      Pertinent Vitals/ Pain       Pain Assessment: 0-10 Pain Score: 3  Pain Location: L hip  Pain Descriptors / Indicators: Aching;Operative site guarding Pain Intervention(s): Monitored during session;Repositioned  Home Living                                          Prior Functioning/Environment              Frequency  Min 2X/week        Progress Toward Goals  OT Goals(current goals can now be  found in the care plan section)  Progress towards OT goals: Progressing toward goals     Plan Discharge plan remains appropriate    Co-evaluation                 AM-PAC PT "6 Clicks" Daily Activity     Outcome Measure   Help from another person eating meals?: None Help from another person taking care of personal grooming?: None Help from another person toileting, which includes using toliet, bedpan, or urinal?: A Little Help from another person bathing (including washing, rinsing, drying)?: A Little Help from another person to put on and taking off regular upper body clothing?: A Little Help from another  person to put on and taking off regular lower body clothing?: A Little 6 Click Score: 20    End of Session Equipment Utilized During Treatment: Gait belt;Rolling walker;Other (comment)(3 in 1)  OT Visit Diagnosis: Unsteadiness on feet (R26.81);Other abnormalities of gait and mobility (R26.89);Pain Pain - Right/Left: Left Pain - part of body: Hip   Activity Tolerance Patient tolerated treatment well   Patient Left in chair;with call bell/phone within reach   Nurse Communication      Functional Assessment Tool Used: AM-PAC 6 Clicks Daily Activity   Time: 9924-2683 OT Time Calculation (min): 19 min  Charges: OT G-codes **NOT FOR INPATIENT CLASS** Functional Assessment Tool Used: AM-PAC 6 Clicks Daily Activity OT General Charges $OT Visit: 1 Visit OT Treatments $Self Care/Home Management : 8-22 mins     Britt Bottom 03/27/2018, 12:44 PM

## 2018-03-27 NOTE — Plan of Care (Signed)
  Problem: Education: Goal: Knowledge of General Education information will improve Outcome: Progressing   

## 2018-03-28 ENCOUNTER — Other Ambulatory Visit: Payer: Self-pay | Admitting: *Deleted

## 2018-03-28 ENCOUNTER — Encounter: Payer: Self-pay | Admitting: *Deleted

## 2018-03-28 ENCOUNTER — Encounter (HOSPITAL_COMMUNITY): Payer: Self-pay | Admitting: Orthopaedic Surgery

## 2018-03-28 DIAGNOSIS — Z471 Aftercare following joint replacement surgery: Secondary | ICD-10-CM | POA: Diagnosis not present

## 2018-03-28 DIAGNOSIS — Z96642 Presence of left artificial hip joint: Secondary | ICD-10-CM | POA: Diagnosis not present

## 2018-03-28 DIAGNOSIS — I10 Essential (primary) hypertension: Secondary | ICD-10-CM | POA: Diagnosis not present

## 2018-03-28 DIAGNOSIS — G4733 Obstructive sleep apnea (adult) (pediatric): Secondary | ICD-10-CM | POA: Diagnosis not present

## 2018-03-28 DIAGNOSIS — E559 Vitamin D deficiency, unspecified: Secondary | ICD-10-CM | POA: Diagnosis not present

## 2018-03-28 DIAGNOSIS — Z6841 Body Mass Index (BMI) 40.0 and over, adult: Secondary | ICD-10-CM | POA: Diagnosis not present

## 2018-03-28 DIAGNOSIS — R7303 Prediabetes: Secondary | ICD-10-CM | POA: Diagnosis not present

## 2018-03-28 DIAGNOSIS — E782 Mixed hyperlipidemia: Secondary | ICD-10-CM | POA: Diagnosis not present

## 2018-03-28 MED ORDER — BUPIVACAINE IN DEXTROSE 0.75-8.25 % IT SOLN
INTRATHECAL | Status: DC | PRN
  Administered 2018-03-25: 2 mL via INTRATHECAL

## 2018-03-28 NOTE — Anesthesia Procedure Notes (Signed)
Spinal  Patient location during procedure: OR Start time: 03/25/2018 11:18 AM End time: 03/25/2018 11:24 AM Staffing Anesthesiologist: Oleta Mouse, MD Preanesthetic Checklist Completed: patient identified, surgical consent, pre-op evaluation, timeout performed, IV checked, risks and benefits discussed and monitors and equipment checked Spinal Block Patient position: sitting Prep: site prepped and draped and DuraPrep Patient monitoring: heart rate, cardiac monitor, continuous pulse ox and blood pressure Approach: midline Location: L4-5 Injection technique: single-shot Needle Needle type: Pencan  Needle gauge: 24 G Needle length: 10 cm Assessment Sensory level: T6

## 2018-03-28 NOTE — Telephone Encounter (Signed)
This encounter was created in error - please disregard.

## 2018-03-28 NOTE — Patient Outreach (Addendum)
[facility]) Care Management  03/28/2018  BRISSIA DELISA 1966/02/24 [id number]   Subjective: Telephone call to patient's home  / mobile number, no answer, left HIPAA compliant voicemail message, and requested call back.     Objective:Per KPN (Knowledge Performance Now, point of care tool) and chart review,patient hospitalized 03/25/18 - 03/27/18 forPrimary osteoarthritis, degenerative joint disease, left hip and knee.    Status post LEFT TOTAL HIP ARTHROPLASTY ANTERIOR APPROACH on 03/25/18 at [facility]. Patient also has a history of hypertension, hyperlipidemia, and sleep apnea.      Assessment: Received UMR Preoperative / Transition of care referral on 03/14/18. Preoperative call completed, and transition of care follow up pending patient contact.    Plan:RNCM will send unsuccessful outreach  letter, [facility] pamphlet, will call patient for 2nd telephone outreach attempt, transition of care follow up, and proceed with case closure, within 10 business days if no return call.       Shyam Dawson H. Annia Friendly, BSN, [place] Management [facility] Telephonic CM Phone: [phone] Fax: [phone]

## 2018-03-28 NOTE — Anesthesia Postprocedure Evaluation (Signed)
Anesthesia Post Note  Patient: Leslie Wright  Procedure(s) Performed: LEFT TOTAL HIP ARTHROPLASTY ANTERIOR APPROACH (Left Hip)     Patient location during evaluation: PACU Anesthesia Type: Spinal and MAC Level of consciousness: awake and alert Pain management: pain level controlled Vital Signs Assessment: post-procedure vital signs reviewed and stable Respiratory status: spontaneous breathing, nonlabored ventilation, respiratory function stable and patient connected to nasal cannula oxygen Cardiovascular status: blood pressure returned to baseline and stable Postop Assessment: no apparent nausea or vomiting and spinal receding Anesthetic complications: no    Last Vitals:  Vitals:   03/26/18 2141 03/27/18 0553  BP: 124/65 (!) 101/59  Pulse: 76 68  Resp: 18   Temp: (!) 38.2 C 37.8 C  SpO2: 92% 91%    Last Pain:  Vitals:   03/27/18 0553  TempSrc: Oral  PainSc:                  [name]

## 2018-03-31 ENCOUNTER — Other Ambulatory Visit: Payer: Self-pay | Admitting: *Deleted

## 2018-03-31 DIAGNOSIS — R7303 Prediabetes: Secondary | ICD-10-CM | POA: Diagnosis not present

## 2018-03-31 DIAGNOSIS — G4733 Obstructive sleep apnea (adult) (pediatric): Secondary | ICD-10-CM | POA: Diagnosis not present

## 2018-03-31 DIAGNOSIS — I10 Essential (primary) hypertension: Secondary | ICD-10-CM | POA: Diagnosis not present

## 2018-03-31 DIAGNOSIS — Z6841 Body Mass Index (BMI) 40.0 and over, adult: Secondary | ICD-10-CM | POA: Diagnosis not present

## 2018-03-31 DIAGNOSIS — E782 Mixed hyperlipidemia: Secondary | ICD-10-CM | POA: Diagnosis not present

## 2018-03-31 DIAGNOSIS — Z471 Aftercare following joint replacement surgery: Secondary | ICD-10-CM | POA: Diagnosis not present

## 2018-03-31 DIAGNOSIS — Z96642 Presence of left artificial hip joint: Secondary | ICD-10-CM | POA: Diagnosis not present

## 2018-03-31 DIAGNOSIS — E559 Vitamin D deficiency, unspecified: Secondary | ICD-10-CM | POA: Diagnosis not present

## 2018-03-31 NOTE — Patient Outreach (Signed)
[facility]) Care Management  03/31/2018  Leslie Wright [birth year] [id number]  Subjective: Telephone call from patient's home / mobile number, spoke with patient, and HIPAA verified.  Discussed [facility] Care Management UMR Transition of care follow up, patient voiced understanding, and is in agreement to follow up. Patient states she remember speaking with this RNCM in the past.   Patient states she is doing great, surgery went well, had 2nd home health physical therapy visit today, and visit went well.  States she has a hospital follow up visit with surgeon on 04/07/18.  Patient states she is able to manage self care and has assistance as needed with activities of daily living / home management.  Patient voices understanding of medical diagnosis, surgery, and treatment plan.  [facility] benefits discussed on 03/18/18 preoperative call and patient states no additional questions at this time.   States she is aware that she can contact [place] [employer] to set up 0% interest payment plan if appropriate. Patient states she does not have any education material, transition of care, care coordination, disease management, disease monitoring, transportation, community resource, or pharmacy needs at this time.  States she is very appreciative of the follow up and is in agreement to receive [facility] information.     Objective:Per KPN (Knowledge Performance Now, point of care tool) and chart review,patienthospitalized6/25/19 - 6/27/19forPrimary osteoarthritis,degenerative joint disease, left hip and knee. Status post LEFT TOTAL HIP ARTHROPLASTY ANTERIORAPPROACH on 6/25/19at [facility]. Patient also has a history of hypertension, hyperlipidemia, and sleep apnea.      Assessment: Received UMR Preoperative / Transition of care referral on 03/14/18.Preoperative call completed, and Transition of care follow up completed, no care management needs, and will  proceed with case closure.      Plan:RNCM will send patient successful outreach letter, [facility] pamphlet, and magnet. RNCM will complete case closure due to follow up completed / no care management needs.       Gemayel Mascio H. Annia Friendly, BSN, [place] Management [facility] Telephonic CM  Phone: [phone] Fax: [phone]

## 2018-03-31 NOTE — Patient Outreach (Signed)
[facility]) Care Management  03/31/2018  Leslie Wright [birth year] [id number]   Subjective: Telephone call to patient's home  / mobile number, no answer, left HIPAA compliant voicemail message, and requested call back.     Objective:Per KPN (Knowledge Performance Now, point of care tool) and chart review,patient hospitalized 03/25/18 - 03/27/18 forPrimary osteoarthritis, degenerative joint disease, left hip and knee.    Status post LEFT TOTAL HIP ARTHROPLASTY ANTERIOR APPROACH on 03/25/18 at [facility]. Patient also has a history of hypertension, hyperlipidemia, and sleep apnea.      Assessment: Received [facility] Preoperative / Transition of care referral on 03/14/18.Preoperative call completed, and transition of care follow up pending patient contact.    Plan:RNCM has sent unsuccessful outreach  letter, [facility] pamphlet, will call patient for 3rd telephone outreach attempt, transition of care follow up, and proceed with case closure, within 10 business days if no return call.      Doristine Shehan H. Annia Friendly, BSN, [place] [facility] Telephonic CM  Phone: [phone] Fax: [phone]

## 2018-04-01 ENCOUNTER — Ambulatory Visit: Payer: Self-pay | Admitting: *Deleted

## 2018-04-02 DIAGNOSIS — Z96642 Presence of left artificial hip joint: Secondary | ICD-10-CM | POA: Diagnosis not present

## 2018-04-02 DIAGNOSIS — Z471 Aftercare following joint replacement surgery: Secondary | ICD-10-CM | POA: Diagnosis not present

## 2018-04-02 DIAGNOSIS — G4733 Obstructive sleep apnea (adult) (pediatric): Secondary | ICD-10-CM | POA: Diagnosis not present

## 2018-04-02 DIAGNOSIS — E559 Vitamin D deficiency, unspecified: Secondary | ICD-10-CM | POA: Diagnosis not present

## 2018-04-02 DIAGNOSIS — Z6841 Body Mass Index (BMI) 40.0 and over, adult: Secondary | ICD-10-CM | POA: Diagnosis not present

## 2018-04-02 DIAGNOSIS — R7303 Prediabetes: Secondary | ICD-10-CM | POA: Diagnosis not present

## 2018-04-02 DIAGNOSIS — E782 Mixed hyperlipidemia: Secondary | ICD-10-CM | POA: Diagnosis not present

## 2018-04-02 DIAGNOSIS — I10 Essential (primary) hypertension: Secondary | ICD-10-CM | POA: Diagnosis not present

## 2018-04-07 ENCOUNTER — Inpatient Hospital Stay (INDEPENDENT_AMBULATORY_CARE_PROVIDER_SITE_OTHER): Payer: 59 | Admitting: Orthopaedic Surgery

## 2018-04-07 ENCOUNTER — Encounter (INDEPENDENT_AMBULATORY_CARE_PROVIDER_SITE_OTHER): Payer: Self-pay | Admitting: Orthopaedic Surgery

## 2018-04-07 ENCOUNTER — Ambulatory Visit (INDEPENDENT_AMBULATORY_CARE_PROVIDER_SITE_OTHER): Payer: 59 | Admitting: Orthopaedic Surgery

## 2018-04-07 DIAGNOSIS — Z96642 Presence of left artificial hip joint: Secondary | ICD-10-CM | POA: Diagnosis not present

## 2018-04-07 DIAGNOSIS — E782 Mixed hyperlipidemia: Secondary | ICD-10-CM | POA: Diagnosis not present

## 2018-04-07 DIAGNOSIS — I10 Essential (primary) hypertension: Secondary | ICD-10-CM | POA: Diagnosis not present

## 2018-04-07 DIAGNOSIS — Z6841 Body Mass Index (BMI) 40.0 and over, adult: Secondary | ICD-10-CM | POA: Diagnosis not present

## 2018-04-07 DIAGNOSIS — G4733 Obstructive sleep apnea (adult) (pediatric): Secondary | ICD-10-CM | POA: Diagnosis not present

## 2018-04-07 DIAGNOSIS — Z471 Aftercare following joint replacement surgery: Secondary | ICD-10-CM | POA: Diagnosis not present

## 2018-04-07 DIAGNOSIS — R7303 Prediabetes: Secondary | ICD-10-CM | POA: Diagnosis not present

## 2018-04-07 DIAGNOSIS — E559 Vitamin D deficiency, unspecified: Secondary | ICD-10-CM | POA: Diagnosis not present

## 2018-04-07 MED ORDER — SULFAMETHOXAZOLE-TRIMETHOPRIM 800-160 MG PO TABS: 1.0000 | ORAL_TABLET | Freq: Two times a day (BID) | ORAL | 0 refills | Status: DC

## 2018-04-07 MED FILL — SULFAMETHOXAZOLE-TMP DS TAB: 800-160 | 10 days supply | Qty: 20 | Fill #0

## 2018-04-07 NOTE — Progress Notes (Signed)
Leslie Wright is 2 weeks status post a left total hip arthroplasty.  She is ambling with a rolling walker but does transition to a cane soon.  She is doing well overall and has already stopped her narcotics.  She is on an aspirin twice a day.  On exam, her suture line looks good so I removed all the sutures.  At the top of her body fold in the groin crease there is overall area and will treat with Bactroban ointment daily and have her keep this area clean and dry otherwise with a gauze in her groin crease daily.  Overall her leg lengths look equal.  She tolerates me putting her hip the range of motion.  I will see her back in 2 weeks for wound check.  She will place Bactroban ointment again on this daily and I will send in some Bactrim double strength as an antibiotic.  All question concerns were answered and addressed.

## 2018-04-10 DIAGNOSIS — R7303 Prediabetes: Secondary | ICD-10-CM | POA: Diagnosis not present

## 2018-04-10 DIAGNOSIS — E782 Mixed hyperlipidemia: Secondary | ICD-10-CM | POA: Diagnosis not present

## 2018-04-10 DIAGNOSIS — G4733 Obstructive sleep apnea (adult) (pediatric): Secondary | ICD-10-CM | POA: Diagnosis not present

## 2018-04-10 DIAGNOSIS — Z96642 Presence of left artificial hip joint: Secondary | ICD-10-CM | POA: Diagnosis not present

## 2018-04-10 DIAGNOSIS — E559 Vitamin D deficiency, unspecified: Secondary | ICD-10-CM | POA: Diagnosis not present

## 2018-04-10 DIAGNOSIS — I10 Essential (primary) hypertension: Secondary | ICD-10-CM | POA: Diagnosis not present

## 2018-04-10 DIAGNOSIS — Z471 Aftercare following joint replacement surgery: Secondary | ICD-10-CM | POA: Diagnosis not present

## 2018-04-10 DIAGNOSIS — Z6841 Body Mass Index (BMI) 40.0 and over, adult: Secondary | ICD-10-CM | POA: Diagnosis not present

## 2018-04-15 ENCOUNTER — Encounter: Payer: Self-pay | Admitting: Gastroenterology

## 2018-04-21 ENCOUNTER — Ambulatory Visit (INDEPENDENT_AMBULATORY_CARE_PROVIDER_SITE_OTHER): Payer: 59 | Admitting: Orthopaedic Surgery

## 2018-04-21 ENCOUNTER — Encounter (INDEPENDENT_AMBULATORY_CARE_PROVIDER_SITE_OTHER): Payer: Self-pay | Admitting: Orthopaedic Surgery

## 2018-04-21 ENCOUNTER — Ambulatory Visit (INDEPENDENT_AMBULATORY_CARE_PROVIDER_SITE_OTHER): Payer: 59 | Admitting: Physician Assistant

## 2018-04-21 VITALS — BP 115/79 | HR 76 | Temp 98.0°F | Ht 66.0 in | Wt 262.0 lb

## 2018-04-21 DIAGNOSIS — I10 Essential (primary) hypertension: Secondary | ICD-10-CM | POA: Diagnosis not present

## 2018-04-21 DIAGNOSIS — Z6841 Body Mass Index (BMI) 40.0 and over, adult: Secondary | ICD-10-CM | POA: Diagnosis not present

## 2018-04-21 DIAGNOSIS — E559 Vitamin D deficiency, unspecified: Secondary | ICD-10-CM

## 2018-04-21 DIAGNOSIS — Z96642 Presence of left artificial hip joint: Secondary | ICD-10-CM

## 2018-04-21 DIAGNOSIS — Z9189 Other specified personal risk factors, not elsewhere classified: Secondary | ICD-10-CM

## 2018-04-21 MED ORDER — VITAMIN D (ERGOCALCIFEROL) 1.25 MG (50000 UNIT) PO CAPS: 50000.0000 [IU] | ORAL_CAPSULE | ORAL | 0 refills | Status: DC

## 2018-04-21 MED ORDER — HYDROCODONE-ACETAMINOPHEN 5-325 MG PO TABS: 1.0000 | ORAL_TABLET | Freq: Four times a day (QID) | ORAL | 0 refills | Status: DC | PRN

## 2018-04-21 MED FILL — HYDROCODON-APAP 5-325: 5-325 | 7 days supply | Qty: 30 | Fill #0

## 2018-04-21 NOTE — Progress Notes (Signed)
Leslie Wright is following up at 27 days status post a left total hip arthroplasty through direct anterior approach.  She is ambulating using a cane.  She is started working from home recently but cannot sit too long.  She still having problems sleeping at night.  On exam her incision actually looks really good.  I do feel that is improving along.  There is a slight opening at the very top but there is no evidence of infection.  She is still working on keeping this area clean and dry putting ointment on it daily.  She is also putting her hip the range of motion as well.  At this point I am going to have her try some hydrocodone at night to see if this will help from sleeping standpoint.  I like to see her back in 4 weeks see how she is doing overall.  She understands we will not x-ray her hip problem ~6 months standpoint unless there is issues.  All question concerns were answered and addressed.

## 2018-04-21 NOTE — Progress Notes (Signed)
Office: [phone]  /  Fax: [phone]   HPI:   Chief Complaint: OBESITY Leslie Wright is here to discuss her progress with her obesity treatment plan. She is on the Category 2 plan and is following her eating plan approximately 0 % of the time. She states she is doing physical therapy for 20-30 minutes 2 times per week. Leslie Wright continues to do well with weight loss. She is status post hip replacement and has been working on being mindful of her eating and controlled her portions.  Her weight is 262 lb (118.8 kg) today and has had a weight loss of 5 pounds over a period of 5 weeks since her last visit. She has lost 25 lbs since starting treatment with [place].  Vitamin D Deficiency Leslie Wright has a diagnosis of vitamin D deficiency. She is currently taking prescription Vit D and denies nausea, vomiting or muscle weakness.  At risk for osteopenia and osteoporosis Leslie Wright is at higher risk of osteopenia and osteoporosis due to vitamin D deficiency.   Hypertension Leslie Wright is a 52 y.o. female with hypertension. Belle's blood pressure is stable and she denies chest pain or shortness of breath. She is working weight loss to help control her blood pressure with the goal of decreasing her risk of heart attack and stroke. Mavi's blood pressure is currently controlled.  ALLERGIES: No Known Allergies  MEDICATIONS: Current Outpatient Medications on File Prior to Visit  Medication Sig Dispense Refill  . acetaminophen (TYLENOL) 650 MG CR tablet Take 650 mg by mouth See admin instructions. TAKE 1 TABLET (650 MG) BY MOUTH SCHEDULED IN THE MORNING, MAY REPEAT DOSE IN THE EVENING IF NEEDED FOR PAIN.    Marland Kitchen aspirin 81 MG chewable tablet Chew 1 tablet (81 mg total) by mouth 2 (two) times daily. 60 tablet 0  . atorvastatin (LIPITOR) 20 MG tablet Take 1 tablet (20 mg total) by mouth daily. 30 tablet 0  . Cyanocobalamin (B-12) 2500 MCG TABS Take 2,500 mcg by mouth daily.    . hydrochlorothiazide (MICROZIDE) 12.5  MG capsule Take 1 capsule (12.5 mg total) by mouth daily. 30 capsule 0  . metFORMIN (GLUCOPHAGE) 500 MG tablet Take 1 tablet (500 mg total) by mouth daily with breakfast. 30 tablet 0  . methocarbamol (ROBAXIN) 500 MG tablet Take 1 tablet (500 mg total) by mouth every 6 (six) hours as needed for muscle spasms. 50 tablet 0  . sulfamethoxazole-trimethoprim (BACTRIM DS,SEPTRA DS) 800-160 MG tablet Take 1 tablet by mouth 2 (two) times daily. 20 tablet 0  . oxyCODONE (OXY IR/ROXICODONE) 5 MG immediate release tablet Take 1-2 tablets (5-10 mg total) by mouth every 4 (four) hours as needed for moderate pain (pain score 4-6). (Patient not taking: Reported on 04/21/2018) 50 tablet 0   No current facility-administered medications on file prior to visit.     PAST MEDICAL HISTORY: Past Medical History:  Diagnosis Date  . Acid reflux   . Anemia   . Arthritis    "maybe in my fingers" (03/26/2018)  . Family history of adverse reaction to anesthesia    "brother, Leslie Wright, and my mom always get sick" (03/26/2018)  . Gallbladder problem   . High blood pressure   . High cholesterol   . Hip pain    "left; before OR" (03/26/2018)  . History of kidney stones   . OSA (obstructive sleep apnea)    "can't tolerate the mask" (03/26/2018)  . Pre-diabetes   . Shortness of breath   . Swelling of  lower extremity    "that's why they put me on fluid pill" (03/26/2018)    PAST SURGICAL HISTORY: Past Surgical History:  Procedure Laterality Date  . CARPAL TUNNEL RELEASE Right ~ 2006  . EYE SURGERY    . JOINT REPLACEMENT    . LAPAROSCOPIC CHOLECYSTECTOMY  12/2010  . LASIK Bilateral 2000  . TOTAL HIP ARTHROPLASTY Left 03/25/2018   Procedure: LEFT TOTAL HIP ARTHROPLASTY ANTERIOR APPROACH;  Surgeon: Mcarthur Rossetti, MD;  Location: [facility];  Service: Orthopedics;  Laterality: Left;    SOCIAL HISTORY: Social History   Tobacco Use  . Smoking status: Never Smoker  . Smokeless tobacco: Never Used  Substance Use  Topics  . Alcohol use: Not Currently  . Drug use: Never    FAMILY HISTORY: Family History  Problem Relation Age of Onset  . Diabetes Mother   . Thyroid disease Mother   . Liver disease Mother   . Obesity Mother   . Hypertension Father   . Hyperlipidemia Father   . Heart disease Father   . Stroke Father   . Obesity Father     ROS: Review of Systems  Constitutional: Positive for weight loss.  Respiratory: Negative for shortness of breath.   Cardiovascular: Negative for chest pain.  Gastrointestinal: Negative for nausea and vomiting.  Musculoskeletal:       Negative muscle weakness    PHYSICAL EXAM: Blood pressure 115/79, pulse 76, temperature 98 F (36.7 C), temperature source Oral, height 5\' 6"  (1.676 m), weight 262 lb (118.8 kg), SpO2 (!) 76 %. Body mass index is 42.29 kg/m. Physical Exam  Constitutional: She is oriented to person, place, and time. She appears well-developed and well-nourished.  Cardiovascular: Normal rate.  Pulmonary/Chest: Effort normal.  Musculoskeletal: Normal range of motion.  Neurological: She is oriented to person, place, and time.  Skin: Skin is warm and dry.  Psychiatric: She has a normal mood and affect. Her behavior is normal.  Vitals reviewed.   RECENT LABS AND TESTS: BMET    Component Value Date/Time   NA 137 03/26/2018 0357   NA 141 11/06/2017 0850   K 3.8 03/26/2018 0357   CL 102 03/26/2018 0357   CO2 27 03/26/2018 0357   GLUCOSE 116 (H) 03/26/2018 0357   BUN 10 03/26/2018 0357   BUN 15 11/06/2017 0850   CREATININE 1.12 (H) 03/26/2018 0357   CALCIUM 8.4 (L) 03/26/2018 0357   GFRNONAA 55 (L) 03/26/2018 0357   GFRAA >60 03/26/2018 0357   Lab Results  Component Value Date   HGBA1C 5.4 03/13/2018   HGBA1C 5.6 11/06/2017   HGBA1C 5.6 07/15/2017   HGBA1C 5.5 04/11/2017   HGBA1C 5.6 12/25/2016   Lab Results  Component Value Date   INSULIN 20.3 11/06/2017   INSULIN 22.2 07/15/2017   INSULIN 20.4 04/11/2017   INSULIN  16.9 12/25/2016   INSULIN 20.0 09/27/2016   CBC    Component Value Date/Time   WBC 9.6 03/26/2018 0357   RBC 3.91 03/26/2018 0357   HGB 10.9 (L) 03/26/2018 0357   HGB 13.4 09/27/2016 1342   HCT 34.2 (L) 03/26/2018 0357   HCT 40.9 09/27/2016 1342   PLT 199 03/26/2018 0357   MCV 87.5 03/26/2018 0357   MCV 83 09/27/2016 1342   MCH 27.9 03/26/2018 0357   MCHC 31.9 03/26/2018 0357   RDW 14.3 03/26/2018 0357   RDW 15.9 (H) 09/27/2016 1342   LYMPHSABS 2.3 09/27/2016 1342   MONOABS 1.1 (H) 11/10/2010 0554   EOSABS 0.6 (  H) 09/27/2016 1342   BASOSABS 0.0 09/27/2016 1342   Iron/TIBC/Ferritin/ %Sat No results found for: IRON, TIBC, FERRITIN, IRONPCTSAT Lipid Panel     Component Value Date/Time   CHOL 163 11/06/2017 0850   TRIG 130 11/06/2017 0850   HDL 36 (L) 11/06/2017 0850   LDLCALC 101 (H) 11/06/2017 0850   Hepatic Function Panel     Component Value Date/Time   PROT 6.6 11/06/2017 0850   ALBUMIN 3.9 11/06/2017 0850   AST 16 11/06/2017 0850   ALT 22 11/06/2017 0850   ALKPHOS 67 11/06/2017 0850   BILITOT 0.3 11/06/2017 0850   BILIDIR 0.1 11/09/2010 1645   IBILI 0.4 11/09/2010 1645      Component Value Date/Time   TSH 1.890 09/27/2016 1342  Results for TIFFANNY, LAMARCHE "KAY" (MRN [id number]) as of 04/21/2018 17:23  Ref. Range 11/06/2017 08:50  Vitamin D, 25-Hydroxy Latest Ref Range: 30.0 - 100.0 ng/mL 29.8 (L)    ASSESSMENT AND PLAN: Vitamin D deficiency - Plan: Vitamin D, Ergocalciferol, (DRISDOL) 50000 units CAPS capsule  Essential hypertension  At risk for osteoporosis  Class 3 severe obesity with serious comorbidity and body mass index (BMI) of 40.0 to 44.9 in adult, unspecified obesity type ([place])  PLAN:  Vitamin D Deficiency Bethannie was informed that low vitamin D levels contributes to fatigue and are associated with obesity, breast, and colon cancer. Shereece agrees to continue taking prescription Vit D @50 ,000 IU every week #4 and we will refill for 1 month. She  will follow up for routine testing of vitamin D, at least 2-3 times per year. She was informed of the risk of over-replacement of vitamin D and agrees to not increase her dose unless she discusses this with [place] first. Shikha agrees to follow up with our clinic in 3 weeks.  At risk for osteopenia and osteoporosis Koraima is at risk for osteopenia and osteoporsis due to her vitamin D deficiency. She was encouraged to take her vitamin D and follow her higher calcium diet and increase strengthening exercise to help strengthen her bones and decrease her risk of osteopenia and osteoporosis.  Hypertension We discussed sodium restriction, working on healthy weight loss, and a regular exercise program as the means to achieve improved blood pressure control. Tannis agreed with this plan and agreed to follow up as directed. We will continue to monitor her blood pressure as well as her progress with the above lifestyle modifications. She will continue her medications and will watch for signs of hypotension as she continues her lifestyle modifications. Sanaiya agrees to follow up with our clinic in 3 weeks.  Obesity Jen is currently in the action stage of change. As such, her goal is to continue with weight loss efforts She has agreed to follow the Category 2 plan Raelyn has been instructed to work up to a goal of 150 minutes of combined cardio and strengthening exercise per week for weight loss and overall health benefits. We discussed the following Behavioral Modification Strategies today: increasing lean protein intake and work on meal planning and easy cooking plans   Lynniah has agreed to follow up with our clinic in 3 weeks. She was informed of the importance of frequent follow up visits to maximize her success with intensive lifestyle modifications for her multiple health conditions.   Wilhemena Durie, am acting as transcriptionist for Lacy Duverney, PA-C I, Lacy Duverney [facility], have reviewed this note and  agree with its content

## 2018-04-22 ENCOUNTER — Ambulatory Visit (INDEPENDENT_AMBULATORY_CARE_PROVIDER_SITE_OTHER): Payer: 59 | Admitting: Physician Assistant

## 2018-05-12 ENCOUNTER — Ambulatory Visit (INDEPENDENT_AMBULATORY_CARE_PROVIDER_SITE_OTHER): Payer: 59 | Admitting: Physician Assistant

## 2018-05-12 ENCOUNTER — Encounter (INDEPENDENT_AMBULATORY_CARE_PROVIDER_SITE_OTHER): Payer: Self-pay | Admitting: Physician Assistant

## 2018-05-12 VITALS — BP 116/83 | HR 70 | Temp 97.9°F | Ht 66.0 in | Wt 260.0 lb

## 2018-05-12 DIAGNOSIS — Z6841 Body Mass Index (BMI) 40.0 and over, adult: Secondary | ICD-10-CM

## 2018-05-12 DIAGNOSIS — R7303 Prediabetes: Secondary | ICD-10-CM

## 2018-05-12 DIAGNOSIS — E7849 Other hyperlipidemia: Secondary | ICD-10-CM

## 2018-05-12 DIAGNOSIS — Z96641 Presence of right artificial hip joint: Secondary | ICD-10-CM

## 2018-05-12 DIAGNOSIS — E559 Vitamin D deficiency, unspecified: Secondary | ICD-10-CM

## 2018-05-12 DIAGNOSIS — M79661 Pain in right lower leg: Secondary | ICD-10-CM

## 2018-05-12 DIAGNOSIS — I10 Essential (primary) hypertension: Secondary | ICD-10-CM

## 2018-05-12 DIAGNOSIS — Z9189 Other specified personal risk factors, not elsewhere classified: Secondary | ICD-10-CM | POA: Diagnosis not present

## 2018-05-12 MED ORDER — VITAMIN D (ERGOCALCIFEROL) 1.25 MG (50000 UNIT) PO CAPS
50000.0000 [IU] | ORAL_CAPSULE | ORAL | 0 refills | Status: DC
Start: 2018-05-12 — End: 2018-06-16

## 2018-05-12 MED ORDER — ATORVASTATIN CALCIUM 20 MG PO TABS: 20.0000 mg | ORAL_TABLET | Freq: Every day | ORAL | 0 refills | Status: DC

## 2018-05-12 MED ORDER — HYDROCHLOROTHIAZIDE 12.5 MG PO CAPS: 12.5000 mg | ORAL_CAPSULE | Freq: Every day | ORAL | 0 refills | Status: DC

## 2018-05-12 MED ORDER — METFORMIN HCL 500 MG PO TABS: 500.0000 mg | ORAL_TABLET | Freq: Every day | ORAL | 0 refills | Status: DC

## 2018-05-12 NOTE — Progress Notes (Signed)
HPI: Leslie Wright Returns Today Due To Right discomfort.  She is now 48 days status post left total hip arthroplasty.  She is wondering if she could have a blood clot in the leg.  She states she started having muscle cramping in the calf no overnight late last week she is been I am able to totally get rid of the muscle aches since then.  Is having ambulate with a cane which she was not using early last week.  Left hip overall doing well.  She has some shortness of breath that is chronic.  No chest pain.  Review of systems: Please see HPI otherwise negative  Physical exam: Right leg calf supple minimal tenderness mostly in the popliteal region.  She is nontender over the right Achilles.  No significant edema bilateral lower extremities.  She has good range of motion of the left hip was able to cross her legs without pain.  She does ambulate with a cane with a slight antalgic gait on the right.  Impression: Status post left total hip arthroplasty 48 days Right calf pain/muscle cramp  Plan: Start on some Flexeril 10 mg 1 p.o. 3 times daily as needed.  Discussed with her staying hydrated.  Also we will have her try some bananas.  She will call middle of the week if the pain is not dissipating will order a Doppler to rule out DVT.

## 2018-05-13 MED FILL — VIT D2 1.25 MG (50,000 UNIT: 1.25 MG | 28 days supply | Qty: 4 | Fill #0

## 2018-05-13 MED FILL — HYDROCHLOROTHIAZIDE 12.5 MG: 12.5 | 30 days supply | Qty: 30 | Fill #0

## 2018-05-13 MED FILL — ATORVASTATIN CALCIUM 20 MG: 20 | 30 days supply | Qty: 30 | Fill #0

## 2018-05-13 MED FILL — metFORMIN HCL 500 MG TABS: 500 | 30 days supply | Qty: 30 | Fill #0

## 2018-05-13 NOTE — Progress Notes (Signed)
Office: [phone]  /  Fax: [phone]   HPI:   Chief Complaint: OBESITY Leslie Wright is here to discuss her progress with her obesity treatment plan. She is on the Category 2 plan and is following her eating plan approximately 0 % of the time. She states she is exercising 0 minutes 0 times per week. Leslie Wright did well with weight loss. She reports not eating all the food on the plan at times. She also states that she had a total hip replacement on 03/25/18, and has not been exercising.  Her weight is 260 lb (117.9 kg) today and has had a weight loss of 2 pounds over a period of 3 weeks since her last visit. She has lost 27 lbs since starting treatment with [place].  Vitamin D Deficiency Leslie Wright has a diagnosis of vitamin D deficiency. She is currently taking prescription Vit D, last level not at goal. She denies nausea, vomiting or muscle weakness.  At risk for osteopenia and osteoporosis Leslie Wright is at higher risk of osteopenia and osteoporosis due to vitamin D deficiency.   Insulin Resistance Leslie Wright has a diagnosis of insulin resistance based on her elevated fasting insulin level >5. Although Leslie Wright's blood glucose readings are still under good control, insulin resistance puts her at greater risk of metabolic syndrome and diabetes. She is taking metformin currently and continues to work on diet and exercise to decrease risk of diabetes. She denies polyphagia, nausea, vomiting, or diarrhea.  Hypertension Leslie Wright is a 52 y.o. female with hypertension. Leslie Wright's blood pressure is controlled and she denies chest pain. She is working weight loss to help control her blood pressure with the goal of decreasing her risk of heart attack and stroke.   Hyperlipidemia Leslie Wright has hyperlipidemia and has been trying to improve her cholesterol levels with intensive lifestyle modification including a low saturated fat diet, exercise and weight loss. She is on atorvastatin and denies any chest pain, claudication  or myalgias.  ALLERGIES: No Known Allergies  MEDICATIONS: Current Outpatient Medications on File Prior to Visit  Medication Sig Dispense Refill  . acetaminophen (TYLENOL) 650 MG CR tablet Take 650 mg by mouth See admin instructions. TAKE 1 TABLET (650 MG) BY MOUTH SCHEDULED IN THE MORNING, MAY REPEAT DOSE IN THE EVENING IF NEEDED FOR PAIN.    Marland Kitchen aspirin 81 MG chewable tablet Chew 1 tablet (81 mg total) by mouth 2 (two) times daily. 60 tablet 0  . Cyanocobalamin (B-12) 2500 MCG TABS Take 2,500 mcg by mouth daily.    Marland Kitchen HYDROcodone-acetaminophen (NORCO/VICODIN) 5-325 MG tablet Take 1 tablet by mouth every 6 (six) hours as needed for moderate pain. 30 tablet 0  . methocarbamol (ROBAXIN) 500 MG tablet Take 1 tablet (500 mg total) by mouth every 6 (six) hours as needed for muscle spasms. 50 tablet 0  . oxyCODONE (OXY IR/ROXICODONE) 5 MG immediate release tablet Take 1-2 tablets (5-10 mg total) by mouth every 4 (four) hours as needed for moderate pain (pain score 4-6). 50 tablet 0  . sulfamethoxazole-trimethoprim (BACTRIM DS,SEPTRA DS) 800-160 MG tablet Take 1 tablet by mouth 2 (two) times daily. 20 tablet 0   No current facility-administered medications on file prior to visit.     PAST MEDICAL HISTORY: Past Medical History:  Diagnosis Date  . Acid reflux   . Anemia   . Arthritis    "maybe in my fingers" (03/26/2018)  . Family history of adverse reaction to anesthesia    "brother, Timmy, and my mom always get  sick" (03/26/2018)  . Gallbladder problem   . High blood pressure   . High cholesterol   . Hip pain    "left; before OR" (03/26/2018)  . History of kidney stones   . OSA (obstructive sleep apnea)    "can't tolerate the mask" (03/26/2018)  . Pre-diabetes   . Shortness of breath   . Swelling of lower extremity    "that's why they put me on fluid pill" (03/26/2018)    PAST SURGICAL HISTORY: Past Surgical History:  Procedure Laterality Date  . CARPAL TUNNEL RELEASE Right ~ 2006  .  EYE SURGERY    . JOINT REPLACEMENT    . LAPAROSCOPIC CHOLECYSTECTOMY  12/2010  . LASIK Bilateral 2000  . TOTAL HIP ARTHROPLASTY Left 03/25/2018   Procedure: LEFT TOTAL HIP ARTHROPLASTY ANTERIOR APPROACH;  Surgeon: Mcarthur Rossetti, MD;  Location: [place];  Service: Orthopedics;  Laterality: Left;    SOCIAL HISTORY: Social History   Tobacco Use  . Smoking status: Never Smoker  . Smokeless tobacco: Never Used  Substance Use Topics  . Alcohol use: Not Currently  . Drug use: Never    FAMILY HISTORY: Family History  Problem Relation Age of Onset  . Diabetes Mother   . Thyroid disease Mother   . Liver disease Mother   . Obesity Mother   . Hypertension Father   . Hyperlipidemia Father   . Heart disease Father   . Stroke Father   . Obesity Father     ROS: Review of Systems  Constitutional: Positive for weight loss.  Cardiovascular: Negative for chest pain and claudication.  Gastrointestinal: Negative for diarrhea, nausea and vomiting.  Musculoskeletal: Negative for myalgias.       Negative muscle weakness  Endo/Heme/Allergies:       Negative polyphagia    PHYSICAL EXAM: Blood pressure 116/83, pulse 70, temperature 97.9 F (36.6 C), temperature source Oral, height 5\' 6"  (1.676 m), weight 260 lb (117.9 kg), SpO2 98 %. Body mass index is 41.97 kg/m. Physical Exam  Constitutional: She is oriented to person, place, and time. She appears well-developed and well-nourished.  Cardiovascular: Normal rate.  Pulmonary/Chest: Effort normal.  Musculoskeletal: Normal range of motion.  Neurological: She is oriented to person, place, and time.  Skin: Skin is warm and dry.  Psychiatric: She has a normal mood and affect. Her behavior is normal.  Vitals reviewed.   RECENT LABS AND TESTS: BMET    Component Value Date/Time   NA 137 03/26/2018 0357   NA 141 11/06/2017 0850   K 3.8 03/26/2018 0357   CL 102 03/26/2018 0357   CO2 27 03/26/2018 0357   GLUCOSE 116 (H) 03/26/2018  0357   BUN 10 03/26/2018 0357   BUN 15 11/06/2017 0850   CREATININE 1.12 (H) 03/26/2018 0357   CALCIUM 8.4 (L) 03/26/2018 0357   GFRNONAA 55 (L) 03/26/2018 0357   GFRAA >60 03/26/2018 0357   Lab Results  Component Value Date   HGBA1C 5.4 03/13/2018   HGBA1C 5.6 11/06/2017   HGBA1C 5.6 07/15/2017   HGBA1C 5.5 04/11/2017   HGBA1C 5.6 12/25/2016   Lab Results  Component Value Date   INSULIN 20.3 11/06/2017   INSULIN 22.2 07/15/2017   INSULIN 20.4 04/11/2017   INSULIN 16.9 12/25/2016   INSULIN 20.0 09/27/2016   CBC    Component Value Date/Time   WBC 9.6 03/26/2018 0357   RBC 3.91 03/26/2018 0357   HGB 10.9 (L) 03/26/2018 0357   HGB 13.4 09/27/2016 1342  HCT 34.2 (L) 03/26/2018 0357   HCT 40.9 09/27/2016 1342   PLT 199 03/26/2018 0357   MCV 87.5 03/26/2018 0357   MCV 83 09/27/2016 1342   MCH 27.9 03/26/2018 0357   MCHC 31.9 03/26/2018 0357   RDW 14.3 03/26/2018 0357   RDW 15.9 (H) 09/27/2016 1342   LYMPHSABS 2.3 09/27/2016 1342   MONOABS 1.1 (H) 11/10/2010 0554   EOSABS 0.6 (H) 09/27/2016 1342   BASOSABS 0.0 09/27/2016 1342   Iron/TIBC/Ferritin/ %Sat No results found for: IRON, TIBC, FERRITIN, IRONPCTSAT Lipid Panel     Component Value Date/Time   CHOL 163 11/06/2017 0850   TRIG 130 11/06/2017 0850   HDL 36 (L) 11/06/2017 0850   LDLCALC 101 (H) 11/06/2017 0850   Hepatic Function Panel     Component Value Date/Time   PROT 6.6 11/06/2017 0850   ALBUMIN 3.9 11/06/2017 0850   AST 16 11/06/2017 0850   ALT 22 11/06/2017 0850   ALKPHOS 67 11/06/2017 0850   BILITOT 0.3 11/06/2017 0850   BILIDIR 0.1 11/09/2010 1645   IBILI 0.4 11/09/2010 1645      Component Value Date/Time   TSH 1.890 09/27/2016 1342  Results for MAZI, SCHUFF "KAY" (MRN [id number]) as of 05/13/2018 10:23  Ref. Range 11/06/2017 08:50  Vitamin D, 25-Hydroxy Latest Ref Range: 30.0 - 100.0 ng/mL 29.8 (L)    ASSESSMENT AND PLAN: Vitamin D deficiency - Plan: Vitamin D, Ergocalciferol, (DRISDOL)  50000 units CAPS capsule  Prediabetes - Plan: metFORMIN (GLUCOPHAGE) 500 MG tablet  Essential hypertension - Plan: hydrochlorothiazide (MICROZIDE) 12.5 MG capsule  Other hyperlipidemia - Plan: atorvastatin (LIPITOR) 20 MG tablet  At risk for osteoporosis  Class 3 severe obesity with serious comorbidity and body mass index (BMI) of 40.0 to 44.9 in adult, unspecified obesity type ([place])  PLAN:  Vitamin D Deficiency Leslie Wright was informed that low vitamin D levels contributes to fatigue and are associated with obesity, breast, and colon cancer. Leslie Wright agrees to continue taking prescription Vit D @50 ,000 IU every week #4 and we will refill for 1 month. She will follow up for routine testing of vitamin D, at least 2-3 times per year. She was informed of the risk of over-replacement of vitamin D and agrees to not increase her dose unless she discusses this with [place] first. Leslie Wright agrees to follow up with our clinic in 2 to 3 weeks.  At risk for osteopenia and osteoporosis Leslie Wright is at risk for osteopenia and osteoporsis due to her vitamin D deficiency. She was encouraged to take her vitamin D and follow her higher calcium diet and increase strengthening exercise to help strengthen her bones and decrease her risk of osteopenia and osteoporosis.  Insulin Resistance Leslie Wright will continue to work on weight loss, exercise, and decreasing simple carbohydrates in her diet to help decrease the risk of diabetes. We dicussed metformin including benefits and risks. She was informed that eating too many simple carbohydrates or too many calories at one sitting increases the likelihood of GI side effects. Leslie Wright agrees to continue taking metformin 500 mg q AM #30 and we will refill for 1 month. Leslie Wright agrees to follow up with our clinic in 2 to 3 weeks as directed to monitor her progress.  Hypertension We discussed sodium restriction, working on healthy weight loss, and a regular exercise program as the means to  achieve improved blood pressure control. Leslie Wright agreed with this plan and agreed to follow up as directed. We will continue to monitor her blood pressure  as well as her progress with the above lifestyle modifications. Leslie Wright agrees to continue taking hydrochlorothiazide 12.5 mg qd #30 and we will refill for 1 month. She will watch for signs of hypotension as she continues her lifestyle modifications. Leslie Wright agrees to follow up with our clinic in 2 to 3 weeks.  Hyperlipidemia Leslie Wright was informed of the American Heart Association Guidelines emphasizing intensive lifestyle modifications as the first line treatment for hyperlipidemia. We discussed many lifestyle modifications today in depth, and Leslie Wright will continue to work on decreasing saturated fats such as fatty red meat, butter and many fried foods. She will also increase vegetables and lean protein in her diet and continue to work on exercise and weight loss efforts. Leslie Wright agrees to continue taking atorvastatin 20 mg qd #30 and we will refill for 1 month. Leslie Wright agrees to follow up with our clinic in 2 to 3 weeks.  Obesity Leslie Wright is currently in the action stage of change. As such, her goal is to continue with weight loss efforts She has agreed to follow the Category 2 plan Leslie Wright has been instructed to work up to a goal of 150 minutes of combined cardio and strengthening exercise per week for weight loss and overall health benefits. We discussed the following Behavioral Modification Strategies today: work on meal planning and easy cooking plans   Leslie Wright has agreed to follow up with our clinic in 2 to 3 weeks. She was informed of the importance of frequent follow up visits to maximize her success with intensive lifestyle modifications for her multiple health conditions.   Wilhemena Durie, am acting as transcriptionist for Abby Potash, PA-C I, Abby Potash, PA-C have reviewed above note and agree with its content

## 2018-05-14 ENCOUNTER — Other Ambulatory Visit (INDEPENDENT_AMBULATORY_CARE_PROVIDER_SITE_OTHER): Payer: Self-pay

## 2018-05-14 ENCOUNTER — Ambulatory Visit (HOSPITAL_COMMUNITY)
Admission: RE | Admit: 2018-05-14 | Discharge: 2018-05-14 | Disposition: A | Discharge status: Home / Self Care | Payer: 59 | Source: Ambulatory Visit | Attending: Orthopaedic Surgery | Admitting: Orthopaedic Surgery

## 2018-05-14 ENCOUNTER — Other Ambulatory Visit (INDEPENDENT_AMBULATORY_CARE_PROVIDER_SITE_OTHER): Payer: Self-pay | Admitting: Orthopaedic Surgery

## 2018-05-14 DIAGNOSIS — Z96642 Presence of left artificial hip joint: Secondary | ICD-10-CM | POA: Diagnosis not present

## 2018-05-14 DIAGNOSIS — M79661 Pain in right lower leg: Secondary | ICD-10-CM

## 2018-05-14 MED ORDER — RIVAROXABAN (XARELTO) VTE STARTER PACK (15 & 20 MG): ORAL_TABLET | ORAL | 0 refills | Status: DC

## 2018-05-14 MED FILL — XARELTO STARTER PACK: 15 & 20 | 30 days supply | Qty: 51 | Fill #0

## 2018-05-15 ENCOUNTER — Telehealth: Payer: Self-pay

## 2018-05-15 NOTE — Telephone Encounter (Signed)
Patient is on an anticoagulant and needs an OV prior to colonoscopy. A message was left to call [phone] to make an appointment.   Riki Sheer, LPN  ( [facility] )

## 2018-05-16 NOTE — Telephone Encounter (Signed)
Called pt and left message on number that identifies pt by first and last name to return call and ask for Leslie Wright in Pre visit

## 2018-05-16 NOTE — Telephone Encounter (Signed)
Pt states she was Just started on Xarelto 05-14-2018 for a DVT from her hip replacement-  She has to take this for 30 days - she follows up with ortho again Monday 05-19-18-- this is a screening colon for pt- , no prev GI hx-  Informed pt we will cancel her pre visit and colon that is scheduled now- she needs to ask ortho when she is cleared for a colonoscopy since it has only been 2 months from her total hip- informed her she will need to be off her Xarelto and I do not think ortho will ok to be off in the next 30 days - once she has completed her Xarelto and ok'd by ortho, she needs to call back and reschedule the Pre visit and colonoscopy-  Pt agreed to this plan - she is a screening with no GI issues per pt- Pt will call once she is cleared - I cancelled the PV and colon for now [facility]

## 2018-05-19 ENCOUNTER — Encounter (INDEPENDENT_AMBULATORY_CARE_PROVIDER_SITE_OTHER): Payer: Self-pay | Admitting: Orthopaedic Surgery

## 2018-05-19 ENCOUNTER — Ambulatory Visit (INDEPENDENT_AMBULATORY_CARE_PROVIDER_SITE_OTHER): Payer: 59 | Admitting: Orthopaedic Surgery

## 2018-05-19 DIAGNOSIS — R7303 Prediabetes: Secondary | ICD-10-CM | POA: Diagnosis not present

## 2018-05-19 DIAGNOSIS — I10 Essential (primary) hypertension: Secondary | ICD-10-CM | POA: Diagnosis not present

## 2018-05-19 DIAGNOSIS — Z96642 Presence of left artificial hip joint: Secondary | ICD-10-CM | POA: Insufficient documentation

## 2018-05-19 DIAGNOSIS — E782 Mixed hyperlipidemia: Secondary | ICD-10-CM | POA: Diagnosis not present

## 2018-05-19 DIAGNOSIS — I824Z1 Acute embolism and thrombosis of unspecified deep veins of right distal lower extremity: Secondary | ICD-10-CM | POA: Insufficient documentation

## 2018-05-19 DIAGNOSIS — N181 Chronic kidney disease, stage 1: Secondary | ICD-10-CM | POA: Diagnosis not present

## 2018-05-19 LAB — HEPATIC FUNCTION PANEL
ALT: 18 (ref 7–35)
AST: 16 (ref 13–35)
Alkaline Phosphatase: 91 (ref 25–125)

## 2018-05-19 LAB — BASIC METABOLIC PANEL
BUN: 16 (ref 4–21)
CREATININE: 1.1 (ref 0.5–1.1)
Glucose: 108

## 2018-05-19 LAB — LIPID PANEL
Cholesterol: 170 (ref 0–200)
HDL: 45 (ref 35–70)
LDL CALC: 101
LDL/HDL RATIO: 2.2
Triglycerides: 119 (ref 40–160)

## 2018-05-19 NOTE — Progress Notes (Signed)
Leslie Wright is in continued follow-up status post a left total hip arthroplasty.  She is 55 days out from the surgery.  She had acute calf and leg swelling on her opposite nonoperative side last week.  This is of the right leg.  We did obtain a Doppler ultrasound and it was positive for a deep vein thrombosis (DVT).  She is now on Xarelto.  The dosage is 15 mg twice a day for 3 weeks then 20 mg once daily.  She understands will need to have her on this for about 3 months with a repeat ultrasound at that point.  She is doing well overall.  She still has some pain with her hip but not severe.  On examination of her left hip the incision is healed over completely.  Her leg lengths are equal.  She still has some residual thigh weakness.  At this point she will continue the Xarelto as prescribed.  She can try compressive socks as well.  She will increase her activities as comfort allows.  We will see her back in 3 months with a repeat low AP pelvis and lateral of her left operative hip.  At that point we will set up for repeat right lower extremity Doppler ultrasound to assess her DVT.  All question concerns were answered and addressed.

## 2018-05-22 DIAGNOSIS — I10 Essential (primary) hypertension: Secondary | ICD-10-CM | POA: Diagnosis not present

## 2018-05-22 DIAGNOSIS — E782 Mixed hyperlipidemia: Secondary | ICD-10-CM | POA: Diagnosis not present

## 2018-05-22 DIAGNOSIS — E119 Type 2 diabetes mellitus without complications: Secondary | ICD-10-CM | POA: Diagnosis not present

## 2018-05-22 DIAGNOSIS — I82401 Acute embolism and thrombosis of unspecified deep veins of right lower extremity: Secondary | ICD-10-CM | POA: Diagnosis not present

## 2018-05-28 ENCOUNTER — Ambulatory Visit (INDEPENDENT_AMBULATORY_CARE_PROVIDER_SITE_OTHER): Payer: 59 | Admitting: Physician Assistant

## 2018-05-28 ENCOUNTER — Encounter (INDEPENDENT_AMBULATORY_CARE_PROVIDER_SITE_OTHER): Payer: Self-pay | Admitting: Physician Assistant

## 2018-05-28 VITALS — BP 107/65 | HR 87 | Temp 97.5°F | Ht 66.0 in | Wt 259.0 lb

## 2018-05-28 DIAGNOSIS — E559 Vitamin D deficiency, unspecified: Secondary | ICD-10-CM | POA: Diagnosis not present

## 2018-05-28 DIAGNOSIS — Z6841 Body Mass Index (BMI) 40.0 and over, adult: Secondary | ICD-10-CM

## 2018-05-29 NOTE — Progress Notes (Signed)
Office: [phone]  /  Fax: [phone]   HPI:   Chief Complaint: OBESITY Leslie Wright is here to discuss her progress with her obesity treatment plan. She is on the Category 2 plan and is following her eating plan approximately 50 % of the time. She states she is walking for 30 minutes 7 times per week. Leslie Wright did well with weight loss. She reports indulging in ice cream and potatoes recently. She states that she is not getting all of her protein in at times.  Her weight is 259 lb (117.5 kg) today and has had a weight loss of 1 pound over a period of 2 to 3 weeks since her last visit. She has lost 28 lbs since starting treatment with [place].  Vitamin D Deficiency Leslie Wright has a diagnosis of vitamin D deficiency. She is on prescription Vit D and denies nausea, vomiting or muscle weakness.  ALLERGIES: No Known Allergies  MEDICATIONS: Current Outpatient Medications on File Prior to Visit  Medication Sig Dispense Refill  . acetaminophen (TYLENOL) 650 MG CR tablet Take 650 mg by mouth See admin instructions. TAKE 1 TABLET (650 MG) BY MOUTH SCHEDULED IN THE MORNING, MAY REPEAT DOSE IN THE EVENING IF NEEDED FOR PAIN.    Marland Kitchen atorvastatin (LIPITOR) 20 MG tablet Take 1 tablet (20 mg total) by mouth daily. 30 tablet 0  . Cyanocobalamin (B-12) 2500 MCG TABS Take 2,500 mcg by mouth daily.    . hydrochlorothiazide (MICROZIDE) 12.5 MG capsule Take 1 capsule (12.5 mg total) by mouth daily. 30 capsule 0  . metFORMIN (GLUCOPHAGE) 500 MG tablet Take 1 tablet (500 mg total) by mouth daily with breakfast. 30 tablet 0  . methocarbamol (ROBAXIN) 500 MG tablet Take 1 tablet (500 mg total) by mouth every 6 (six) hours as needed for muscle spasms. 50 tablet 0  . Rivaroxaban 15 & 20 MG TBPK Take as directed on package: Start with one 15mg  tablet by mouth twice a day with food. On Day 22, switch to one 20mg  tablet once a day with food. 51 each 0  . Vitamin D, Ergocalciferol, (DRISDOL) 50000 units CAPS capsule Take 1 capsule  (50,000 Units total) by mouth every 7 (seven) days. 4 capsule 0   No current facility-administered medications on file prior to visit.     PAST MEDICAL HISTORY: Past Medical History:  Diagnosis Date  . Acid reflux   . Anemia   . Arthritis    "maybe in my fingers" (03/26/2018)  . Family history of adverse reaction to anesthesia    "brother, Otila Kluver, and my mom always get sick" (03/26/2018)  . Gallbladder problem   . High blood pressure   . High cholesterol   . Hip pain    "left; before OR" (03/26/2018)  . History of kidney stones   . OSA (obstructive sleep apnea)    "can't tolerate the mask" (03/26/2018)  . Pre-diabetes   . Shortness of breath   . Swelling of lower extremity    "that's why they put me on fluid pill" (03/26/2018)    PAST SURGICAL HISTORY: Past Surgical History:  Procedure Laterality Date  . CARPAL TUNNEL RELEASE Right ~ 2006  . EYE SURGERY    . JOINT REPLACEMENT    . LAPAROSCOPIC CHOLECYSTECTOMY  12/2010  . LASIK Bilateral 2000  . TOTAL HIP ARTHROPLASTY Left 03/25/2018   Procedure: LEFT TOTAL HIP ARTHROPLASTY ANTERIOR APPROACH;  Surgeon: Mcarthur Rossetti, MD;  Location: [place];  Service: Orthopedics;  Laterality: Left;  SOCIAL HISTORY: Social History   Tobacco Use  . Smoking status: Never Smoker  . Smokeless tobacco: Never Used  Substance Use Topics  . Alcohol use: Not Currently  . Drug use: Never    FAMILY HISTORY: Family History  Problem Relation Age of Onset  . Diabetes Mother   . Thyroid disease Mother   . Liver disease Mother   . Obesity Mother   . Hypertension Father   . Hyperlipidemia Father   . Heart disease Father   . Stroke Father   . Obesity Father     ROS: Review of Systems  Constitutional: Positive for weight loss.  Gastrointestinal: Negative for nausea and vomiting.  Musculoskeletal:       Negative muscle weakness    PHYSICAL EXAM: Blood pressure 107/65, pulse 87, temperature (!) 97.5 F (36.4 C), temperature  source Oral, height 5\' 6"  (1.676 m), weight 259 lb (117.5 kg), SpO2 98 %. Body mass index is 41.8 kg/m. Physical Exam  Constitutional: She is oriented to person, place, and time. She appears well-developed and well-nourished.  Cardiovascular: Normal rate.  Pulmonary/Chest: Effort normal.  Musculoskeletal: Normal range of motion.  Neurological: She is oriented to person, place, and time.  Skin: Skin is warm and dry.  Psychiatric: She has a normal mood and affect. Her behavior is normal.  Vitals reviewed.   RECENT LABS AND TESTS: BMET    Component Value Date/Time   NA 137 03/26/2018 0357   NA 141 11/06/2017 0850   K 3.8 03/26/2018 0357   CL 102 03/26/2018 0357   CO2 27 03/26/2018 0357   GLUCOSE 116 (H) 03/26/2018 0357   BUN 10 03/26/2018 0357   BUN 15 11/06/2017 0850   CREATININE 1.12 (H) 03/26/2018 0357   CALCIUM 8.4 (L) 03/26/2018 0357   GFRNONAA 55 (L) 03/26/2018 0357   GFRAA >60 03/26/2018 0357   Lab Results  Component Value Date   HGBA1C 5.4 03/13/2018   HGBA1C 5.6 11/06/2017   HGBA1C 5.6 07/15/2017   HGBA1C 5.5 04/11/2017   HGBA1C 5.6 12/25/2016   Lab Results  Component Value Date   INSULIN 20.3 11/06/2017   INSULIN 22.2 07/15/2017   INSULIN 20.4 04/11/2017   INSULIN 16.9 12/25/2016   INSULIN 20.0 09/27/2016   CBC    Component Value Date/Time   WBC 9.6 03/26/2018 0357   RBC 3.91 03/26/2018 0357   HGB 10.9 (L) 03/26/2018 0357   HGB 13.4 09/27/2016 1342   HCT 34.2 (L) 03/26/2018 0357   HCT 40.9 09/27/2016 1342   PLT 199 03/26/2018 0357   MCV 87.5 03/26/2018 0357   MCV 83 09/27/2016 1342   MCH 27.9 03/26/2018 0357   MCHC 31.9 03/26/2018 0357   RDW 14.3 03/26/2018 0357   RDW 15.9 (H) 09/27/2016 1342   LYMPHSABS 2.3 09/27/2016 1342   MONOABS 1.1 (H) 11/10/2010 0554   EOSABS 0.6 (H) 09/27/2016 1342   BASOSABS 0.0 09/27/2016 1342   Iron/TIBC/Ferritin/ %Sat No results found for: IRON, TIBC, FERRITIN, IRONPCTSAT Lipid Panel     Component Value  Date/Time   CHOL 163 11/06/2017 0850   TRIG 130 11/06/2017 0850   HDL 36 (L) 11/06/2017 0850   LDLCALC 101 (H) 11/06/2017 0850   Hepatic Function Panel     Component Value Date/Time   PROT 6.6 11/06/2017 0850   ALBUMIN 3.9 11/06/2017 0850   AST 16 11/06/2017 0850   ALT 22 11/06/2017 0850   ALKPHOS 67 11/06/2017 0850   BILITOT 0.3 11/06/2017 0850   BILIDIR  0.1 11/09/2010 1645   IBILI 0.4 11/09/2010 1645      Component Value Date/Time   TSH 1.890 09/27/2016 1342  Results for ZAYLEI, MULLANE" (MRN [id number]) as of 05/29/2018 09:31  Ref. Range 11/06/2017 08:50  Vitamin D, 25-Hydroxy Latest Ref Range: 30.0 - 100.0 ng/mL 29.8 (L)    ASSESSMENT AND PLAN: Vitamin D deficiency  Class 3 severe obesity with serious comorbidity and body mass index (BMI) of 40.0 to 44.9 in adult, unspecified obesity type ([place])  PLAN:  Vitamin D Deficiency Leslie Wright was informed that low vitamin D levels contributes to fatigue and are associated with obesity, breast, and colon cancer. Leslie Wright agrees to continue taking prescription Vit D @50 ,000 IU every week and will follow up for routine testing of vitamin D, at least 2-3 times per year. She was informed of the risk of over-replacement of vitamin D and agrees to not increase her dose unless she discusses this with [place] first. Keith agrees to follow up with our clinic in 2 to 3 weeks.  I spent > than 50% of the 15 minute visit on counseling as documented in the note.  Obesity Leslie Wright is currently in the action stage of change. As such, her goal is to continue with weight loss efforts She has agreed to follow the Category 2 plan Leslie Wright has been instructed to work up to a goal of 150 minutes of combined cardio and strengthening exercise per week for weight loss and overall health benefits. We discussed the following Behavioral Modification Strategies today: increasing lean protein intake and work on meal planning and easy cooking plans   Leslie Wright has agreed  to follow up with our clinic in 2 to 3 weeks. She was informed of the importance of frequent follow up visits to maximize her success with intensive lifestyle modifications for her multiple health conditions.   OBESITY BEHAVIORAL INTERVENTION VISIT  Today's visit was # 41.  Starting weight: 287 lbs Starting date: 09/27/16 Today's weight : 259 lbs Today's date: 05/28/2018 Total lbs lost to date: 17    ASK: We discussed the diagnosis of obesity with Leslie Wright today and Leslie Wright agreed to give [place] permission to discuss obesity behavioral modification therapy today.  ASSESS: Leslie Wright has the diagnosis of obesity and her BMI today is 41.82 Leslie Wright is in the action stage of change   ADVISE: Leslie Wright was educated on the multiple health risks of obesity as well as the benefit of weight loss to improve her health. She was advised of the need for long term treatment and the importance of lifestyle modifications.  AGREE: Multiple dietary modification options and treatment options were discussed and  Leslie Wright agreed to the above obesity treatment plan.  Wilhemena Durie, am acting as transcriptionist for Abby Potash, PA-C I, Abby Potash, PA-C have reviewed above note and agree with its content

## 2018-06-06 ENCOUNTER — Other Ambulatory Visit (INDEPENDENT_AMBULATORY_CARE_PROVIDER_SITE_OTHER): Payer: Self-pay

## 2018-06-06 MED ORDER — RIVAROXABAN 20 MG PO TABS: ORAL_TABLET | ORAL | 0 refills | Status: DC

## 2018-06-06 MED FILL — XARELTO 20 MG TABLET: 20 | 90 days supply | Qty: 90 | Fill #0

## 2018-06-09 ENCOUNTER — Encounter: Payer: 59 | Admitting: Gastroenterology

## 2018-06-16 ENCOUNTER — Ambulatory Visit (INDEPENDENT_AMBULATORY_CARE_PROVIDER_SITE_OTHER): Payer: 59 | Admitting: Physician Assistant

## 2018-06-16 VITALS — BP 128/86 | HR 71 | Temp 97.4°F | Ht 66.0 in | Wt 269.0 lb

## 2018-06-16 DIAGNOSIS — Z9189 Other specified personal risk factors, not elsewhere classified: Secondary | ICD-10-CM | POA: Diagnosis not present

## 2018-06-16 DIAGNOSIS — E559 Vitamin D deficiency, unspecified: Secondary | ICD-10-CM

## 2018-06-16 DIAGNOSIS — R7303 Prediabetes: Secondary | ICD-10-CM

## 2018-06-16 DIAGNOSIS — Z6841 Body Mass Index (BMI) 40.0 and over, adult: Secondary | ICD-10-CM

## 2018-06-16 MED ORDER — VITAMIN D (ERGOCALCIFEROL) 1.25 MG (50000 UNIT) PO CAPS: 50000.0000 [IU] | ORAL_CAPSULE | ORAL | 0 refills | Status: DC

## 2018-06-16 MED FILL — VIT D2 1.25 MG (50,000 UNIT: 1.25 MG | 28 days supply | Qty: 4 | Fill #0

## 2018-06-18 NOTE — Progress Notes (Signed)
Office: [phone]  /  Fax: [phone]   HPI:   Chief Complaint: OBESITY Leslie Wright is here to discuss her progress with her obesity treatment plan. She is on the Category 2 plan and is following her eating plan approximately 55 % of the time. She states she is walking 15 minutes 7 times per week. Leslie Wright reports that she did not follow the plan, due to spending time with family. She reports eating a lot of simple carbohydrates. Leslie Wright is now ready to get back on track. Her weight is 269 lb (122 kg) today and has not lost weight since her last visit. She has lost 18 lbs since starting treatment with [place].  Vitamin D deficiency Leslie Wright has a diagnosis of vitamin D deficiency. She is currently taking vit D and denies nausea, vomiting or muscle weakness.  At risk for osteopenia and osteoporosis Leslie Wright is at higher risk of osteopenia and osteoporosis due to vitamin D deficiency.   Pre-Diabetes Leslie Wright has a diagnosis of prediabetes and her most recent Hgb A1c was at 5.5 with her PCP, Dr. Ernie Hew and was informed this puts her at greater risk of developing diabetes. She is taking metformin currently and continues to work on diet and exercise to decrease risk of diabetes. She denies polyphagia or hypoglycemia.  ALLERGIES: No Known Allergies  MEDICATIONS: Current Outpatient Medications on File Prior to Visit  Medication Sig Dispense Refill  . acetaminophen (TYLENOL) 650 MG CR tablet Take 650 mg by mouth See admin instructions. TAKE 1 TABLET (650 MG) BY MOUTH SCHEDULED IN THE MORNING, MAY REPEAT DOSE IN THE EVENING IF NEEDED FOR PAIN.    Marland Kitchen atorvastatin (LIPITOR) 20 MG tablet Take 1 tablet (20 mg total) by mouth daily. 30 tablet 0  . Cyanocobalamin (B-12) 2500 MCG TABS Take 2,500 mcg by mouth daily.    . hydrochlorothiazide (MICROZIDE) 12.5 MG capsule Take 1 capsule (12.5 mg total) by mouth daily. 30 capsule 0  . metFORMIN (GLUCOPHAGE) 500 MG tablet Take 1 tablet (500 mg total) by mouth daily with  breakfast. 30 tablet 0  . Rivaroxaban 15 & 20 MG TBPK Take as directed on package: Start with one 15mg  tablet by mouth twice a day with food. On Day 22, switch to one 20mg  tablet once a day with food. 51 each 0   No current facility-administered medications on file prior to visit.     PAST MEDICAL HISTORY: Past Medical History:  Diagnosis Date  . Acid reflux   . Anemia   . Arthritis    "maybe in my fingers" (03/26/2018)  . Family history of adverse reaction to anesthesia    "brother, Otila Kluver, and my mom always get sick" (03/26/2018)  . Gallbladder problem   . High blood pressure   . High cholesterol   . Hip pain    "left; before OR" (03/26/2018)  . History of kidney stones   . OSA (obstructive sleep apnea)    "can't tolerate the mask" (03/26/2018)  . Pre-diabetes   . Shortness of breath   . Swelling of lower extremity    "that's why they put me on fluid pill" (03/26/2018)    PAST SURGICAL HISTORY: Past Surgical History:  Procedure Laterality Date  . CARPAL TUNNEL RELEASE Right ~ 2006  . EYE SURGERY    . JOINT REPLACEMENT    . LAPAROSCOPIC CHOLECYSTECTOMY  12/2010  . LASIK Bilateral 2000  . TOTAL HIP ARTHROPLASTY Left 03/25/2018   Procedure: LEFT TOTAL HIP ARTHROPLASTY ANTERIOR APPROACH;  Surgeon: Ninfa Linden,  Lind Guest, MD;  Location: [facility];  Service: Orthopedics;  Laterality: Left;    SOCIAL HISTORY: Social History   Tobacco Use  . Smoking status: Never Smoker  . Smokeless tobacco: Never Used  Substance Use Topics  . Alcohol use: Not Currently  . Drug use: Never    FAMILY HISTORY: Family History  Problem Relation Age of Onset  . Diabetes Mother   . Thyroid disease Mother   . Liver disease Mother   . Obesity Mother   . Hypertension Father   . Hyperlipidemia Father   . Heart disease Father   . Stroke Father   . Obesity Father     ROS: Review of Systems  Constitutional: Negative for weight loss.  Gastrointestinal: Negative for nausea and vomiting.    Musculoskeletal:       Negative for muscle weakness  Endo/Heme/Allergies:       Negative for hypoglycemia Negative for polyphagia    PHYSICAL EXAM: Blood pressure 128/86, pulse 71, temperature (!) 97.4 F (36.3 C), temperature source Oral, height 5\' 6"  (1.676 m), weight 269 lb (122 kg), last menstrual period 01/21/2018, SpO2 99 %. Body mass index is 43.42 kg/m. Physical Exam  Constitutional: She is oriented to person, place, and time. She appears well-developed and well-nourished.  Cardiovascular: Normal rate.  Pulmonary/Chest: Effort normal.  Musculoskeletal: Normal range of motion.  Neurological: She is oriented to person, place, and time.  Skin: Skin is warm and dry.  Psychiatric: She has a normal mood and affect. Her behavior is normal.  Vitals reviewed.   RECENT LABS AND TESTS: BMET    Component Value Date/Time   NA 137 03/26/2018 0357   NA 141 11/06/2017 0850   K 3.8 03/26/2018 0357   CL 102 03/26/2018 0357   CO2 27 03/26/2018 0357   GLUCOSE 116 (H) 03/26/2018 0357   BUN 10 03/26/2018 0357   BUN 15 11/06/2017 0850   CREATININE 1.12 (H) 03/26/2018 0357   CALCIUM 8.4 (L) 03/26/2018 0357   GFRNONAA 55 (L) 03/26/2018 0357   GFRAA >60 03/26/2018 0357   Lab Results  Component Value Date   HGBA1C 5.4 03/13/2018   HGBA1C 5.6 11/06/2017   HGBA1C 5.6 07/15/2017   HGBA1C 5.5 04/11/2017   HGBA1C 5.6 12/25/2016   Lab Results  Component Value Date   INSULIN 20.3 11/06/2017   INSULIN 22.2 07/15/2017   INSULIN 20.4 04/11/2017   INSULIN 16.9 12/25/2016   INSULIN 20.0 09/27/2016   CBC    Component Value Date/Time   WBC 9.6 03/26/2018 0357   RBC 3.91 03/26/2018 0357   HGB 10.9 (L) 03/26/2018 0357   HGB 13.4 09/27/2016 1342   HCT 34.2 (L) 03/26/2018 0357   HCT 40.9 09/27/2016 1342   PLT 199 03/26/2018 0357   MCV 87.5 03/26/2018 0357   MCV 83 09/27/2016 1342   MCH 27.9 03/26/2018 0357   MCHC 31.9 03/26/2018 0357   RDW 14.3 03/26/2018 0357   RDW 15.9 (H)  09/27/2016 1342   LYMPHSABS 2.3 09/27/2016 1342   MONOABS 1.1 (H) 11/10/2010 0554   EOSABS 0.6 (H) 09/27/2016 1342   BASOSABS 0.0 09/27/2016 1342   Iron/TIBC/Ferritin/ %Sat No results found for: IRON, TIBC, FERRITIN, IRONPCTSAT Lipid Panel     Component Value Date/Time   CHOL 163 11/06/2017 0850   TRIG 130 11/06/2017 0850   HDL 36 (L) 11/06/2017 0850   LDLCALC 101 (H) 11/06/2017 0850   Hepatic Function Panel     Component Value Date/Time   PROT  6.6 11/06/2017 0850   ALBUMIN 3.9 11/06/2017 0850   AST 16 11/06/2017 0850   ALT 22 11/06/2017 0850   ALKPHOS 67 11/06/2017 0850   BILITOT 0.3 11/06/2017 0850   BILIDIR 0.1 11/09/2010 1645   IBILI 0.4 11/09/2010 1645      Component Value Date/Time   TSH 1.890 09/27/2016 1342   Results for JADIN, KAGEL "KAY" (MRN [id number]) as of 06/18/2018 10:52  Ref. Range 11/06/2017 08:50  Vitamin D, 25-Hydroxy Latest Ref Range: 30.0 - 100.0 ng/mL 29.8 (L)   ASSESSMENT AND PLAN: Vitamin D deficiency - Plan: Vitamin D, Ergocalciferol, (DRISDOL) 50000 units CAPS capsule  At risk for osteoporosis  Class 3 severe obesity with serious comorbidity and body mass index (BMI) of 40.0 to 44.9 in adult, unspecified obesity type ([place])  PLAN:  Vitamin D Deficiency Leslie Wright was informed that low vitamin D levels contributes to fatigue and are associated with obesity, breast, and colon cancer. She agrees to continue to take prescription Vit D @50 ,000 IU every week #4 with no refills and will follow up for routine testing of vitamin D, at least 2-3 times per year. She was informed of the risk of over-replacement of vitamin D and agrees to not increase her dose unless she discusses this with [place] first. Leslie Wright agrees to follow up as directed.  At risk for osteopenia and osteoporosis Leslie Wright was given extended  (15 minutes) osteoporosis prevention counseling today. Leslie Wright is at risk for osteopenia and osteoporosis due to her vitamin D deficiency. She was  encouraged to take her vitamin D and follow her higher calcium diet and increase strengthening exercise to help strengthen her bones and decrease her risk of osteopenia and osteoporosis.  Pre-Diabetes Leslie Wright will continue to work on weight loss, exercise, and decreasing simple carbohydrates in her diet to help decrease the risk of diabetes. We dicussed metformin including benefits and risks. She was informed that eating too many simple carbohydrates or too many calories at one sitting increases the likelihood of GI side effects. Leslie Wright will continue metformin for now and a prescription was not written today. Leslie Wright agreed to follow up with [place] as directed to monitor her progress.  Obesity Leslie Wright is currently in the action stage of change. As such, her goal is to continue with weight loss efforts She has agreed to follow the Category 2 plan Leslie Wright has been instructed to work up to a goal of 150 minutes of combined cardio and strengthening exercise per week for weight loss and overall health benefits. We discussed the following Behavioral Modification Strategies today: keeping healthy foods in the home and work on meal planning and easy cooking plans  Kawana has agreed to follow up with our clinic in 2 weeks. She was informed of the importance of frequent follow up visits to maximize her success with intensive lifestyle modifications for her multiple health conditions.   OBESITY BEHAVIORAL INTERVENTION VISIT  Today's visit was # 36  Starting weight: 287 lbs Starting date: 09/27/16 Today's weight : 269 lbs Today's date: 9/16//2019 Total lbs lost to date: 32   ASK: We discussed the diagnosis of obesity with Leslie Wright today and Leslie Wright agreed to give [place] permission to discuss obesity behavioral modification therapy today.  ASSESS: Leslie Wright has the diagnosis of obesity and her BMI today is 43.44 Leslie Wright is in the action stage of change   ADVISE: Leslie Wright was educated on the multiple health  risks of obesity as well as the benefit of weight loss to improve  her health. She was advised of the need for long term treatment and the importance of lifestyle modifications to improve her current health and to decrease her risk of future health problems.  AGREE: Multiple dietary modification options and treatment options were discussed and  Leslie Wright agreed to follow the recommendations documented in the above note.  ARRANGE: Leslie Wright was educated on the importance of frequent visits to treat obesity as outlined per CMS and USPSTF guidelines and agreed to schedule her next follow up appointment today.  Corey Skains, am acting as transcriptionist for Abby Potash, PA-C I, Abby Potash, PA-C have reviewed above note and agree with its content

## 2018-06-19 ENCOUNTER — Encounter (INDEPENDENT_AMBULATORY_CARE_PROVIDER_SITE_OTHER): Payer: Self-pay

## 2018-07-02 ENCOUNTER — Ambulatory Visit (INDEPENDENT_AMBULATORY_CARE_PROVIDER_SITE_OTHER): Payer: 59 | Admitting: Physician Assistant

## 2018-07-02 ENCOUNTER — Encounter (INDEPENDENT_AMBULATORY_CARE_PROVIDER_SITE_OTHER): Payer: Self-pay | Admitting: Physician Assistant

## 2018-07-02 VITALS — BP 109/77 | HR 57 | Temp 98.2°F | Ht 66.0 in | Wt 272.0 lb

## 2018-07-02 DIAGNOSIS — E559 Vitamin D deficiency, unspecified: Secondary | ICD-10-CM | POA: Diagnosis not present

## 2018-07-02 DIAGNOSIS — Z9189 Other specified personal risk factors, not elsewhere classified: Secondary | ICD-10-CM

## 2018-07-02 DIAGNOSIS — R7303 Prediabetes: Secondary | ICD-10-CM | POA: Diagnosis not present

## 2018-07-02 DIAGNOSIS — Z6841 Body Mass Index (BMI) 40.0 and over, adult: Secondary | ICD-10-CM | POA: Diagnosis not present

## 2018-07-02 NOTE — Progress Notes (Signed)
Office: [phone]  /  Fax: [phone]   HPI:   Chief Complaint: OBESITY Leslie Wright is here to discuss her progress with her obesity treatment plan. She is on the Category 2 plan and is following her eating plan approximately 0 % of the time. She states she is exercising 0 minutes 0 times per week. Leslie Wright struggled to follow the plan due to her father having a stroke. She reports that she has been stress eating and eating out a lot more. Leslie Wright is ready to get back on track. Her weight is 272 lb (123.4 kg) today and has not lost weight since her last visit. She has lost 15 lbs since starting treatment with [place].  Vitamin D deficiency Leslie Wright has a diagnosis of vitamin D deficiency. She is currently taking prescription vit D and denies nausea, vomiting or muscle weakness.  At risk for osteopenia and osteoporosis Leslie Wright is at higher risk of osteopenia and osteoporosis due to vitamin D deficiency.   Pre-Diabetes Leslie Wright has a diagnosis of prediabetes based on her elevated Hgb A1c and was informed this puts her at greater risk of developing diabetes. She is taking metformin currently and continues to work on diet and exercise to decrease risk of diabetes. She denies nausea, vomiting, diarrhea or hypoglycemia.  ALLERGIES: No Known Allergies  MEDICATIONS: Current Outpatient Medications on File Prior to Visit  Medication Sig Dispense Refill  . acetaminophen (TYLENOL) 650 MG CR tablet Take 650 mg by mouth See admin instructions. TAKE 1 TABLET (650 MG) BY MOUTH SCHEDULED IN THE MORNING, MAY REPEAT DOSE IN THE EVENING IF NEEDED FOR PAIN.    Marland Kitchen atorvastatin (LIPITOR) 20 MG tablet Take 1 tablet (20 mg total) by mouth daily. 30 tablet 0  . Cyanocobalamin (B-12) 2500 MCG TABS Take 2,500 mcg by mouth daily.    . hydrochlorothiazide (MICROZIDE) 12.5 MG capsule Take 1 capsule (12.5 mg total) by mouth daily. 30 capsule 0  . metFORMIN (GLUCOPHAGE) 500 MG tablet Take 1 tablet (500 mg total) by mouth daily  with breakfast. 30 tablet 0  . Rivaroxaban 15 & 20 MG TBPK Take as directed on package: Start with one 15mg  tablet by mouth twice a day with food. On Day 22, switch to one 20mg  tablet once a day with food. 51 each 0  . Vitamin D, Ergocalciferol, (DRISDOL) 50000 units CAPS capsule Take 1 capsule (50,000 Units total) by mouth every 7 (seven) days. 4 capsule 0   No current facility-administered medications on file prior to visit.     PAST MEDICAL HISTORY: Past Medical History:  Diagnosis Date  . Acid reflux   . Anemia   . Arthritis    "maybe in my fingers" (03/26/2018)  . Family history of adverse reaction to anesthesia    "brother, Otila Kluver, and my mom always get sick" (03/26/2018)  . Gallbladder problem   . High blood pressure   . High cholesterol   . Hip pain    "left; before OR" (03/26/2018)  . History of kidney stones   . OSA (obstructive sleep apnea)    "can't tolerate the mask" (03/26/2018)  . Pre-diabetes   . Shortness of breath   . Swelling of lower extremity    "that's why they put me on fluid pill" (03/26/2018)    PAST SURGICAL HISTORY: Past Surgical History:  Procedure Laterality Date  . CARPAL TUNNEL RELEASE Right ~ 2006  . EYE SURGERY    . JOINT REPLACEMENT    . LAPAROSCOPIC CHOLECYSTECTOMY  12/2010  .  LASIK Bilateral 2000  . TOTAL HIP ARTHROPLASTY Left 03/25/2018   Procedure: LEFT TOTAL HIP ARTHROPLASTY ANTERIOR APPROACH;  Surgeon: Mcarthur Rossetti, MD;  Location: [facility];  Service: Orthopedics;  Laterality: Left;    SOCIAL HISTORY: Social History   Tobacco Use  . Smoking status: Never Smoker  . Smokeless tobacco: Never Used  Substance Use Topics  . Alcohol use: Not Currently  . Drug use: Never    FAMILY HISTORY: Family History  Problem Relation Age of Onset  . Diabetes Mother   . Thyroid disease Mother   . Liver disease Mother   . Obesity Mother   . Hypertension Father   . Hyperlipidemia Father   . Heart disease Father   . Stroke Father   .  Obesity Father     ROS: Review of Systems  Constitutional: Negative for weight loss.  Gastrointestinal: Negative for diarrhea, nausea and vomiting.  Musculoskeletal:       Negative for muscle weakness  Endo/Heme/Allergies:       Negative for hypoglycemia    PHYSICAL EXAM: Blood pressure 109/77, pulse (!) 57, temperature 98.2 F (36.8 C), temperature source Oral, height 5\' 6"  (1.676 m), weight 272 lb (123.4 kg), SpO2 97 %. Body mass index is 43.9 kg/m. Physical Exam  Constitutional: She is oriented to person, place, and time. She appears well-developed and well-nourished.  Cardiovascular: Normal rate.  Pulmonary/Chest: Effort normal.  Musculoskeletal: Normal range of motion.  Neurological: She is oriented to person, place, and time.  Skin: Skin is warm and dry.  Psychiatric: She has a normal mood and affect. Her behavior is normal.  Vitals reviewed.   RECENT LABS AND TESTS: BMET    Component Value Date/Time   NA 137 03/26/2018 0357   NA 141 11/06/2017 0850   K 3.8 03/26/2018 0357   CL 102 03/26/2018 0357   CO2 27 03/26/2018 0357   GLUCOSE 116 (H) 03/26/2018 0357   BUN 16 05/19/2018   CREATININE 1.1 05/19/2018   CREATININE 1.12 (H) 03/26/2018 0357   CALCIUM 8.4 (L) 03/26/2018 0357   GFRNONAA 55 (L) 03/26/2018 0357   GFRAA >60 03/26/2018 0357   Lab Results  Component Value Date   HGBA1C 5.4 03/13/2018   HGBA1C 5.6 11/06/2017   HGBA1C 5.6 07/15/2017   HGBA1C 5.5 04/11/2017   HGBA1C 5.6 12/25/2016   Lab Results  Component Value Date   INSULIN 20.3 11/06/2017   INSULIN 22.2 07/15/2017   INSULIN 20.4 04/11/2017   INSULIN 16.9 12/25/2016   INSULIN 20.0 09/27/2016   CBC    Component Value Date/Time   WBC 9.6 03/26/2018 0357   RBC 3.91 03/26/2018 0357   HGB 10.9 (L) 03/26/2018 0357   HGB 13.4 09/27/2016 1342   HCT 34.2 (L) 03/26/2018 0357   HCT 40.9 09/27/2016 1342   PLT 199 03/26/2018 0357   MCV 87.5 03/26/2018 0357   MCV 83 09/27/2016 1342   MCH 27.9  03/26/2018 0357   MCHC 31.9 03/26/2018 0357   RDW 14.3 03/26/2018 0357   RDW 15.9 (H) 09/27/2016 1342   LYMPHSABS 2.3 09/27/2016 1342   MONOABS 1.1 (H) 11/10/2010 0554   EOSABS 0.6 (H) 09/27/2016 1342   BASOSABS 0.0 09/27/2016 1342   Iron/TIBC/Ferritin/ %Sat No results found for: IRON, TIBC, FERRITIN, IRONPCTSAT Lipid Panel     Component Value Date/Time   CHOL 170 05/19/2018   CHOL 163 11/06/2017 0850   TRIG 119 05/19/2018   HDL 45 05/19/2018   HDL 36 (L) 11/06/2017 0850  LDLCALC 101 05/19/2018   LDLCALC 101 (H) 11/06/2017 0850   Hepatic Function Panel     Component Value Date/Time   PROT 6.6 11/06/2017 0850   ALBUMIN 3.9 11/06/2017 0850   AST 16 05/19/2018   ALT 18 05/19/2018   ALKPHOS 91 05/19/2018   BILITOT 0.3 11/06/2017 0850   BILIDIR 0.1 11/09/2010 1645   IBILI 0.4 11/09/2010 1645      Component Value Date/Time   TSH 1.890 09/27/2016 1342   Results for Leslie Wright, REICHARDT "KAY" (MRN [id number]) as of 07/02/2018 10:38  Ref. Range 11/06/2017 08:50  Vitamin D, 25-Hydroxy Latest Ref Range: 30.0 - 100.0 ng/mL 29.8 (L)   ASSESSMENT AND PLAN: Vitamin D deficiency - Plan: VITAMIN D 25 Hydroxy (Vit-D Deficiency, Fractures)  Prediabetes - Plan: Comprehensive metabolic panel, Hemoglobin A1c, Insulin, random  At risk for osteoporosis  Class 3 severe obesity with serious comorbidity and body mass index (BMI) of 40.0 to 44.9 in adult, unspecified obesity type ([place])  PLAN:  Vitamin D Deficiency Ota was informed that low vitamin D levels contributes to fatigue and are associated with obesity, breast, and colon cancer. She agrees to continue to take prescription Vit D @50 ,000 IU every week and will follow up for routine testing of vitamin D, at least 2-3 times per year. She was informed of the risk of over-replacement of vitamin D and agrees to not increase her dose unless she discusses this with [place] first. We will check labs and Roshawna will follow up at the agreed upon  time.  At risk for osteopenia and osteoporosis Meshawn was given extended  (15 minutes) osteoporosis prevention counseling today. Johnasia is at risk for osteopenia and osteoporosis due to her vitamin D deficiency. She was encouraged to take her vitamin D and follow her higher calcium diet and increase strengthening exercise to help strengthen her bones and decrease her risk of osteopenia and osteoporosis.  Pre-Diabetes Jamia will continue to work on weight loss, exercise, and decreasing simple carbohydrates in her diet to help decrease the risk of diabetes. We dicussed metformin including benefits and risks. She was informed that eating too many simple carbohydrates or too many calories at one sitting increases the likelihood of GI side effects. Karizma will continue metformin. We will check labs and Tessah agreed to follow up with [place] as directed to monitor her progress.  Obesity Chisa is currently in the action stage of change. As such, her goal is to continue with weight loss efforts She has agreed to follow the Category 2 plan Christain has been instructed to work up to a goal of 150 minutes of combined cardio and strengthening exercise per week for weight loss and overall health benefits. We discussed the following Behavioral Modification Strategies today: increasing lean protein intake, decreasing simple carbohydrates , decrease eating out and work on meal planning and easy cooking plans  Aya has agreed to follow up with our clinic in 2 weeks. She was informed of the importance of frequent follow up visits to maximize her success with intensive lifestyle modifications for her multiple health conditions.   OBESITY BEHAVIORAL INTERVENTION VISIT  Today's visit was # 31  Starting weight: 287 lbs Starting date: 09/27/16 Today's weight : 272 lbs  Today's date: 07/02/2018 Total lbs lost to date: 15   ASK: We discussed the diagnosis of obesity with Ladona Ridgel today and Rosaria Ferries agreed to  give [place] permission to discuss obesity behavioral modification therapy today.  ASSESS: Vara has the diagnosis of obesity  and her BMI today is 43.92 Devanie is in the action stage of change   ADVISE: Sarissa was educated on the multiple health risks of obesity as well as the benefit of weight loss to improve her health. She was advised of the need for long term treatment and the importance of lifestyle modifications to improve her current health and to decrease her risk of future health problems.  AGREE: Multiple dietary modification options and treatment options were discussed and  Desarae agreed to follow the recommendations documented in the above note.  ARRANGE: Trust was educated on the importance of frequent visits to treat obesity as outlined per CMS and USPSTF guidelines and agreed to schedule her next follow up appointment today.  Corey Skains, am acting as transcriptionist for Abby Potash, PA-C I, Abby Potash, PA-C have reviewed above note and agree with its content

## 2018-07-03 LAB — COMPREHENSIVE METABOLIC PANEL
A/G RATIO: 1.6 (ref 1.2–2.2)
ALBUMIN: 4.2 g/dL (ref 3.5–5.5)
ALT: 14 IU/L (ref 0–32)
AST: 16 IU/L (ref 0–40)
Alkaline Phosphatase: 84 IU/L (ref 39–117)
BUN / CREAT RATIO: 12 (ref 9–23)
BUN: 14 mg/dL (ref 6–24)
CALCIUM: 9.3 mg/dL (ref 8.7–10.2)
CHLORIDE: 102 mmol/L (ref 96–106)
CO2: 23 mmol/L (ref 20–29)
Creatinine, Ser: 1.13 mg/dL — ABNORMAL HIGH (ref 0.57–1.00)
GFR, EST AFRICAN AMERICAN: 64 mL/min/{1.73_m2} (ref >59)
GFR, EST NON AFRICAN AMERICAN: 56 mL/min/{1.73_m2} — AB (ref >59)
GLOBULIN, TOTAL: 2.7 g/dL (ref 1.5–4.5)
Glucose: 84 mg/dL (ref 65–99)
POTASSIUM: 4.3 mmol/L (ref 3.5–5.2)
Sodium: 140 mmol/L (ref 134–144)
TOTAL PROTEIN: 6.9 g/dL (ref 6.0–8.5)

## 2018-07-03 LAB — HEMOGLOBIN A1C
Est. average glucose Bld gHb Est-mCnc: 117 mg/dL
Hgb A1c MFr Bld: 5.7 % — ABNORMAL HIGH (ref 4.8–5.6)

## 2018-07-03 LAB — VITAMIN D 25 HYDROXY (VIT D DEFICIENCY, FRACTURES): Vit D, 25-Hydroxy: 30.3 ng/mL (ref 30.0–100.0)

## 2018-07-03 LAB — INSULIN, RANDOM: INSULIN: 27.9 u[IU]/mL — AB (ref 2.6–24.9)

## 2018-07-16 ENCOUNTER — Encounter (INDEPENDENT_AMBULATORY_CARE_PROVIDER_SITE_OTHER): Payer: Self-pay | Admitting: Physician Assistant

## 2018-07-16 ENCOUNTER — Ambulatory Visit (INDEPENDENT_AMBULATORY_CARE_PROVIDER_SITE_OTHER): Payer: 59 | Admitting: Physician Assistant

## 2018-07-16 VITALS — BP 116/81 | HR 72 | Temp 98.4°F | Ht 66.0 in | Wt 276.0 lb

## 2018-07-16 DIAGNOSIS — E559 Vitamin D deficiency, unspecified: Secondary | ICD-10-CM | POA: Diagnosis not present

## 2018-07-16 DIAGNOSIS — Z6841 Body Mass Index (BMI) 40.0 and over, adult: Secondary | ICD-10-CM

## 2018-07-16 DIAGNOSIS — E66813 Obesity, class 3: Secondary | ICD-10-CM

## 2018-07-17 NOTE — Progress Notes (Signed)
Office: [phone]  /  Fax: [phone]   HPI:   Chief Complaint: OBESITY Leslie Wright is here to discuss her progress with her obesity treatment plan. She is on the Category 2 plan and is following her eating plan approximately 50 % of the time. She states she is exercising 0 minutes 0 times per week. Leslie Wright reports struggling to follow the plan due to eating out more. She also states she hasn't been able to walk during the day due to taking care of her dad.  Her weight is 276 lb (125.2 kg) today and has gained 4 pounds since her last visit. She has lost 11 lbs since starting treatment with [place].  Vitamin D Deficiency [name] has a diagnosis of vitamin D deficiency. She is currently taking prescription Vit D, last level not at goal. She denies nausea, vomiting or muscle weakness.  ALLERGIES: No Known Allergies  MEDICATIONS: Current Outpatient Medications on File Prior to Visit  Medication Sig Dispense Refill  . acetaminophen (TYLENOL) 650 MG CR tablet Take 650 mg by mouth See admin instructions. TAKE 1 TABLET (650 MG) BY MOUTH SCHEDULED IN THE MORNING, MAY REPEAT DOSE IN THE EVENING IF NEEDED FOR PAIN.    Marland Kitchen atorvastatin (LIPITOR) 20 MG tablet Take 1 tablet (20 mg total) by mouth daily. 30 tablet 0  . Cyanocobalamin (B-12) 2500 MCG TABS Take 2,500 mcg by mouth daily.    . hydrochlorothiazide (MICROZIDE) 12.5 MG capsule Take 1 capsule (12.5 mg total) by mouth daily. 30 capsule 0  . metFORMIN (GLUCOPHAGE) 500 MG tablet Take 1 tablet (500 mg total) by mouth daily with breakfast. 30 tablet 0  . rivaroxaban (XARELTO) 20 MG TABS tablet Take 20 mg by mouth daily with supper.    . Vitamin D, Ergocalciferol, (DRISDOL) 50000 units CAPS capsule Take 1 capsule (50,000 Units total) by mouth every 7 (seven) days. 4 capsule 0   No current facility-administered medications on file prior to visit.     PAST MEDICAL HISTORY: Past Medical History:  Diagnosis Date  . Acid reflux   . Anemia   . Arthritis     "maybe in my fingers" (03/26/2018)  . Family history of adverse reaction to anesthesia    "brother, Otila Kluver, and my mom always get sick" (03/26/2018)  . Gallbladder problem   . High blood pressure   . High cholesterol   . Hip pain    "left; before OR" (03/26/2018)  . History of kidney stones   . OSA (obstructive sleep apnea)    "can't tolerate the mask" (03/26/2018)  . Pre-diabetes   . Shortness of breath   . Swelling of lower extremity    "that's why they put me on fluid pill" (03/26/2018)    PAST SURGICAL HISTORY: Past Surgical History:  Procedure Laterality Date  . CARPAL TUNNEL RELEASE Right ~ 2006  . EYE SURGERY    . JOINT REPLACEMENT    . LAPAROSCOPIC CHOLECYSTECTOMY  12/2010  . LASIK Bilateral 2000  . TOTAL HIP ARTHROPLASTY Left 03/25/2018   Procedure: LEFT TOTAL HIP ARTHROPLASTY ANTERIOR APPROACH;  Surgeon: Mcarthur Rossetti, MD;  Location: [facility];  Service: Orthopedics;  Laterality: Left;    SOCIAL HISTORY: Social History   Tobacco Use  . Smoking status: Never Smoker  . Smokeless tobacco: Never Used  Substance Use Topics  . Alcohol use: Not Currently  . Drug use: Never    FAMILY HISTORY: Family History  Problem Relation Age of Onset  . Diabetes Mother   .  Thyroid disease Mother   . Liver disease Mother   . Obesity Mother   . Hypertension Father   . Hyperlipidemia Father   . Heart disease Father   . Stroke Father   . Obesity Father     ROS: Review of Systems  Constitutional: Negative for weight loss.  Gastrointestinal: Negative for nausea and vomiting.  Musculoskeletal:       Negative muscle weakness    PHYSICAL EXAM: Blood pressure 116/81, pulse 72, temperature 98.4 F (36.9 C), temperature source Oral, height 5\' 6"  (1.676 m), weight 276 lb (125.2 kg), SpO2 98 %. Body mass index is 44.55 kg/m. Physical Exam  Constitutional: She is oriented to person, place, and time. She appears well-developed and well-nourished.  Cardiovascular: Normal  rate.  Pulmonary/Chest: Effort normal.  Musculoskeletal: Normal range of motion.  Neurological: She is oriented to person, place, and time.  Skin: Skin is warm and dry.  Psychiatric: She has a normal mood and affect. Her behavior is normal.  Vitals reviewed.   RECENT LABS AND TESTS: BMET    Component Value Date/Time   NA 140 07/02/2018 1159   K 4.3 07/02/2018 1159   CL 102 07/02/2018 1159   CO2 23 07/02/2018 1159   GLUCOSE 84 07/02/2018 1159   GLUCOSE 116 (H) 03/26/2018 0357   BUN 14 07/02/2018 1159   CREATININE 1.13 (H) 07/02/2018 1159   CALCIUM 9.3 07/02/2018 1159   GFRNONAA 56 (L) 07/02/2018 1159   GFRAA 64 07/02/2018 1159   Lab Results  Component Value Date   HGBA1C 5.7 (H) 07/02/2018   HGBA1C 5.4 03/13/2018   HGBA1C 5.6 11/06/2017   HGBA1C 5.6 07/15/2017   HGBA1C 5.5 04/11/2017   Lab Results  Component Value Date   INSULIN 27.9 (H) 07/02/2018   INSULIN 20.3 11/06/2017   INSULIN 22.2 07/15/2017   INSULIN 20.4 04/11/2017   INSULIN 16.9 12/25/2016   CBC    Component Value Date/Time   WBC 9.6 03/26/2018 0357   RBC 3.91 03/26/2018 0357   HGB 10.9 (L) 03/26/2018 0357   HGB 13.4 09/27/2016 1342   HCT 34.2 (L) 03/26/2018 0357   HCT 40.9 09/27/2016 1342   PLT 199 03/26/2018 0357   MCV 87.5 03/26/2018 0357   MCV 83 09/27/2016 1342   MCH 27.9 03/26/2018 0357   MCHC 31.9 03/26/2018 0357   RDW 14.3 03/26/2018 0357   RDW 15.9 (H) 09/27/2016 1342   LYMPHSABS 2.3 09/27/2016 1342   MONOABS 1.1 (H) 11/10/2010 0554   EOSABS 0.6 (H) 09/27/2016 1342   BASOSABS 0.0 09/27/2016 1342   Iron/TIBC/Ferritin/ %Sat No results found for: IRON, TIBC, FERRITIN, IRONPCTSAT Lipid Panel     Component Value Date/Time   CHOL 170 05/19/2018   CHOL 163 11/06/2017 0850   TRIG 119 05/19/2018   HDL 45 05/19/2018   HDL 36 (L) 11/06/2017 0850   LDLCALC 101 05/19/2018   LDLCALC 101 (H) 11/06/2017 0850   Hepatic Function Panel     Component Value Date/Time   PROT 6.9 07/02/2018  1159   ALBUMIN 4.2 07/02/2018 1159   AST 16 07/02/2018 1159   ALT 14 07/02/2018 1159   ALKPHOS 84 07/02/2018 1159   BILITOT <0.2 07/02/2018 1159   BILIDIR 0.1 11/09/2010 1645   IBILI 0.4 11/09/2010 1645      Component Value Date/Time   TSH 1.890 09/27/2016 1342   Results for ANANI, GU "KAY" (MRN [id number]) as of 07/17/2018 09:54  Ref. Range 07/02/2018 11:59  Vitamin D, 25-Hydroxy Latest  Ref Range: 30.0 - 100.0 ng/mL 30.3   ASSESSMENT AND PLAN: Vitamin D deficiency  Class 3 severe obesity with serious comorbidity and body mass index (BMI) of 40.0 to 44.9 in adult, unspecified obesity type ([place])  PLAN:  Vitamin D Deficiency Blossie was informed that low vitamin D levels contributes to fatigue and are associated with obesity, breast, and colon cancer. Carmell agrees to continue taking prescription Vit D @50 ,000 IU every week and will follow up for routine testing of vitamin D, at least 2-3 times per year. She was informed of the risk of over-replacement of vitamin D and agrees to not increase her dose unless she discusses this with [place] first. Jeanise agrees to follow up with our clinic in 2 weeks.  I spent > than 50% of the 15 minute visit on counseling as documented in the note.  Obesity Akhila is currently in the action stage of change. As such, her goal is to continue with weight loss efforts She has agreed to keep a food journal with 400-500 calories and 35 grams of protein at supper daily and follow the Category 2 plan Sarra has been instructed to work up to a goal of 150 minutes of combined cardio and strengthening exercise per week for weight loss and overall health benefits. We discussed the following Behavioral Modification Strategies today: increasing lean protein intake and decrease eating out   Romaine has agreed to follow up with our clinic in 2 weeks. She was informed of the importance of frequent follow up visits to maximize her success with intensive lifestyle  modifications for her multiple health conditions.   OBESITY BEHAVIORAL INTERVENTION VISIT  Today's visit was # 38   Starting weight: 287 lbs Starting date: 09/27/16 Today's weight : 276 lbs  Today's date: 07/16/2018 Total lbs lost to date: 11    ASK: We discussed the diagnosis of obesity with Ladona Ridgel today and Rosaria Ferries agreed to give [place] permission to discuss obesity behavioral modification therapy today.  ASSESS: Chau has the diagnosis of obesity and her BMI today is 44.57 Shadee is in the action stage of change   ADVISE: Dondra was educated on the multiple health risks of obesity as well as the benefit of weight loss to improve her health. She was advised of the need for long term treatment and the importance of lifestyle modifications.  AGREE: Multiple dietary modification options and treatment options were discussed and  Wilda agreed to the above obesity treatment plan.  Wilhemena Durie, am acting as transcriptionist for Abby Potash, PA-C I, Abby Potash, PA-C have reviewed above note and agree with its content

## 2018-07-30 ENCOUNTER — Encounter (INDEPENDENT_AMBULATORY_CARE_PROVIDER_SITE_OTHER): Payer: Self-pay | Admitting: Physician Assistant

## 2018-07-30 ENCOUNTER — Ambulatory Visit (INDEPENDENT_AMBULATORY_CARE_PROVIDER_SITE_OTHER): Payer: 59 | Admitting: Physician Assistant

## 2018-07-30 VITALS — BP 112/80 | HR 72 | Temp 97.7°F | Ht 66.0 in | Wt 274.0 lb

## 2018-07-30 DIAGNOSIS — Z9189 Other specified personal risk factors, not elsewhere classified: Secondary | ICD-10-CM | POA: Diagnosis not present

## 2018-07-30 DIAGNOSIS — E7849 Other hyperlipidemia: Secondary | ICD-10-CM

## 2018-07-30 DIAGNOSIS — E559 Vitamin D deficiency, unspecified: Secondary | ICD-10-CM

## 2018-07-30 DIAGNOSIS — I10 Essential (primary) hypertension: Secondary | ICD-10-CM | POA: Diagnosis not present

## 2018-07-30 DIAGNOSIS — Z6841 Body Mass Index (BMI) 40.0 and over, adult: Secondary | ICD-10-CM | POA: Diagnosis not present

## 2018-07-30 MED ORDER — VITAMIN D (ERGOCALCIFEROL) 1.25 MG (50000 UNIT) PO CAPS: 50000.0000 [IU] | ORAL_CAPSULE | ORAL | 0 refills | Status: DC

## 2018-07-30 MED ORDER — HYDROCHLOROTHIAZIDE 12.5 MG PO CAPS: 12.5000 mg | ORAL_CAPSULE | Freq: Every day | ORAL | 0 refills | Status: DC

## 2018-07-30 MED ORDER — ATORVASTATIN CALCIUM 20 MG PO TABS: 20.0000 mg | ORAL_TABLET | Freq: Every day | ORAL | 0 refills | Status: DC

## 2018-07-30 NOTE — Progress Notes (Signed)
Office: [phone]  /  Fax: [phone]   HPI:   Chief Complaint: OBESITY Leslie Wright is here to discuss her progress with her obesity treatment plan. She is on the  keep a food journal with 400-500 calories and 35g of protein for supper and follow the Category 2 plan and is following her eating plan approximately 50 % of the time. She states she is exercising 0 minutes 0 times per week. Leslie Wright did very well with weight loss. She reports that she has been eating breakfast and lunch on the plan but struggles with dinner. She is not journaling any meals currently.   Her weight is 274 lb (124.3 kg) today and has had a weight loss of 2 pounds over a period of 2 weeks since her last visit. She has lost 13 lbs since starting treatment with [place].  Hyperlipidemia Leslie Wright has hyperlipidemia and has been trying to improve her cholesterol levels with intensive lifestyle modification including a low saturated fat diet, exercise and weight loss. She denies any chest pain, claudication or myalgias. She is currently taking Atorvastatin.   Vitamin D deficiency Leslie Wright has a diagnosis of vitamin D deficiency. She is currently taking vit D and denies nausea, vomiting or muscle weakness.   Ref. Range 07/02/2018 11:59  Vitamin D, 25-Hydroxy Latest Ref Range: 30.0 - 100.0 ng/mL 30.3   Hypertension Leslie Wright is a 52 y.o. female with hypertension.  Leslie Wright denies chest pain or shortness of breath on exertion. She is working weight loss to help control her blood Wright with the goal of decreasing her risk of heart attack and stroke. Leslie Wright is currently controlled.  At risk for cardiovascular disease Leslie Wright is at a higher than average risk for cardiovascular disease due to obesity. She currently denies any chest pain.  ALLERGIES: No Known Allergies  MEDICATIONS: Current Outpatient Medications on File Prior to Visit  Medication Sig Dispense Refill  . acetaminophen (TYLENOL) 650 MG CR  tablet Take 650 mg by mouth See admin instructions. TAKE 1 TABLET (650 MG) BY MOUTH SCHEDULED IN THE MORNING, MAY REPEAT DOSE IN THE EVENING IF NEEDED FOR PAIN.    Marland Kitchen Cyanocobalamin (B-12) 2500 MCG TABS Take 2,500 mcg by mouth daily.    . metFORMIN (GLUCOPHAGE) 500 MG tablet Take 1 tablet (500 mg total) by mouth daily with breakfast. 30 tablet 0  . rivaroxaban (XARELTO) 20 MG TABS tablet Take 20 mg by mouth daily with supper.     No current facility-administered medications on file prior to visit.     PAST MEDICAL HISTORY: Past Medical History:  Diagnosis Date  . Acid reflux   . Anemia   . Arthritis    "maybe in my fingers" (03/26/2018)  . Family history of adverse reaction to anesthesia    "brother, Otila Kluver, and my mom always get sick" (03/26/2018)  . Gallbladder problem   . High blood Wright   . High cholesterol   . Hip pain    "left; before OR" (03/26/2018)  . History of kidney stones   . OSA (obstructive sleep apnea)    "can't tolerate the mask" (03/26/2018)  . Pre-diabetes   . Shortness of breath   . Swelling of lower extremity    "that's why they put me on fluid pill" (03/26/2018)    PAST SURGICAL HISTORY: Past Surgical History:  Procedure Laterality Date  . CARPAL TUNNEL RELEASE Right ~ 2006  . EYE SURGERY    . JOINT REPLACEMENT    .  LAPAROSCOPIC CHOLECYSTECTOMY  12/2010  . LASIK Bilateral 2000  . TOTAL HIP ARTHROPLASTY Left 03/25/2018   Procedure: LEFT TOTAL HIP ARTHROPLASTY ANTERIOR APPROACH;  Surgeon: Mcarthur Rossetti, MD;  Location: [facility];  Service: Orthopedics;  Laterality: Left;    SOCIAL HISTORY: Social History   Tobacco Use  . Smoking status: Never Smoker  . Smokeless tobacco: Never Used  Substance Use Topics  . Alcohol use: Not Currently  . Drug use: Never    FAMILY HISTORY: Family History  Problem Relation Age of Onset  . Diabetes Mother   . Thyroid disease Mother   . Liver disease Mother   . Obesity Mother   . Hypertension Father   .  Hyperlipidemia Father   . Heart disease Father   . Stroke Father   . Obesity Father     ROS: Review of Systems  Constitutional: Positive for weight loss.  Respiratory: Negative for shortness of breath.   Cardiovascular: Negative for chest pain and claudication.  Gastrointestinal: Negative for nausea and vomiting.  Musculoskeletal: Negative for myalgias.       Negative for muscle weakness    PHYSICAL EXAM: Blood Wright 112/80, pulse 72, temperature 97.7 F (36.5 C), temperature source Oral, height 5\' 6"  (1.676 m), weight 274 lb (124.3 kg), SpO2 100 %. Body mass index is 44.22 kg/m. Physical Exam  Constitutional: She is oriented to person, place, and time. She appears well-developed and well-nourished.  HENT:  Head: Normocephalic.  Neck: Normal range of motion.  Cardiovascular: Normal rate.  Pulmonary/Chest: Effort normal.  Musculoskeletal: Normal range of motion.  Neurological: She is alert and oriented to person, place, and time.  Skin: Skin is warm and dry.  Psychiatric: She has a normal mood and affect. Her behavior is normal.  Vitals reviewed.   RECENT LABS AND TESTS: BMET    Component Value Date/Time   NA 140 07/02/2018 1159   K 4.3 07/02/2018 1159   CL 102 07/02/2018 1159   CO2 23 07/02/2018 1159   GLUCOSE 84 07/02/2018 1159   GLUCOSE 116 (H) 03/26/2018 0357   BUN 14 07/02/2018 1159   CREATININE 1.13 (H) 07/02/2018 1159   CALCIUM 9.3 07/02/2018 1159   GFRNONAA 56 (L) 07/02/2018 1159   GFRAA 64 07/02/2018 1159   Lab Results  Component Value Date   HGBA1C 5.7 (H) 07/02/2018   HGBA1C 5.4 03/13/2018   HGBA1C 5.6 11/06/2017   HGBA1C 5.6 07/15/2017   HGBA1C 5.5 04/11/2017   Lab Results  Component Value Date   INSULIN 27.9 (H) 07/02/2018   INSULIN 20.3 11/06/2017   INSULIN 22.2 07/15/2017   INSULIN 20.4 04/11/2017   INSULIN 16.9 12/25/2016   CBC    Component Value Date/Time   WBC 9.6 03/26/2018 0357   RBC 3.91 03/26/2018 0357   HGB 10.9 (L)  03/26/2018 0357   HGB 13.4 09/27/2016 1342   HCT 34.2 (L) 03/26/2018 0357   HCT 40.9 09/27/2016 1342   PLT 199 03/26/2018 0357   MCV 87.5 03/26/2018 0357   MCV 83 09/27/2016 1342   MCH 27.9 03/26/2018 0357   MCHC 31.9 03/26/2018 0357   RDW 14.3 03/26/2018 0357   RDW 15.9 (H) 09/27/2016 1342   LYMPHSABS 2.3 09/27/2016 1342   MONOABS 1.1 (H) 11/10/2010 0554   EOSABS 0.6 (H) 09/27/2016 1342   BASOSABS 0.0 09/27/2016 1342   Iron/TIBC/Ferritin/ %Sat No results found for: IRON, TIBC, FERRITIN, IRONPCTSAT Lipid Panel     Component Value Date/Time   CHOL 170 05/19/2018  CHOL 163 11/06/2017 0850   TRIG 119 05/19/2018   HDL 45 05/19/2018   HDL 36 (L) 11/06/2017 0850   LDLCALC 101 05/19/2018   LDLCALC 101 (H) 11/06/2017 0850   Hepatic Function Panel     Component Value Date/Time   PROT 6.9 07/02/2018 1159   ALBUMIN 4.2 07/02/2018 1159   AST 16 07/02/2018 1159   ALT 14 07/02/2018 1159   ALKPHOS 84 07/02/2018 1159   BILITOT <0.2 07/02/2018 1159   BILIDIR 0.1 11/09/2010 1645   IBILI 0.4 11/09/2010 1645      Component Value Date/Time   TSH 1.890 09/27/2016 1342    Ref. Range 07/02/2018 11:59  Vitamin D, 25-Hydroxy Latest Ref Range: 30.0 - 100.0 ng/mL 30.3    ASSESSMENT AND PLAN: Other hyperlipidemia - Plan: atorvastatin (LIPITOR) 20 MG tablet  Vitamin D deficiency - Plan: Vitamin D, Ergocalciferol, (DRISDOL) 50000 units CAPS capsule  Essential hypertension - Plan: hydrochlorothiazide (MICROZIDE) 12.5 MG capsule  At risk for heart disease  Class 3 severe obesity with serious comorbidity and body mass index (BMI) of 40.0 to 44.9 in adult, unspecified obesity type (Leslie Wright)  PLAN: Hyperlipidemia Leslie Wright was informed of the American Heart Association Guidelines emphasizing intensive lifestyle modifications as the first line treatment for hyperlipidemia. We discussed many lifestyle modifications today in depth, and Leslie Wright will continue to work on decreasing saturated fats such as  fatty red meat, butter and many fried foods. She will also increase vegetables and lean protein in her diet and continue to work on exercise and weight loss efforts. She agrees to continue Atorvastatin 20 mg qd #30 with no refills. Agrees to follow up with our clinic as directed.   Vitamin D Deficiency Leslie Wright was informed that low vitamin D levels contributes to fatigue and are associated with obesity, breast, and colon cancer. She agrees to continue to take prescription Vit D @50 ,000 IU every week #4 with no refills and will follow up for routine testing of vitamin D, at least 2-3 times per year. She was informed of the risk of over-replacement of vitamin D and agrees to not increase her dose unless she discusses this with [place] first.  Hypertension We discussed sodium restriction, working on healthy weight loss, and a regular exercise program as the means to achieve improved blood Wright control. Leslie Wright agreed with this plan and agreed to follow up as directed. We will continue to monitor her blood Wright as well as her progress with the above lifestyle modifications. She agrees to continue hydrochlorothiazide 12.5 mg qd #30 with no refills and will watch for signs of hypotension as she continues her lifestyle modifications.  Cardiovascular risk counseling Leslie Wright was given extended (15 minutes) coronary artery disease prevention counseling today. She is 53 y.o. female and has risk factors for heart disease including obesity. We discussed intensive lifestyle modifications today with an emphasis on specific weight loss instructions and strategies. Pt was also informed of the importance of increasing exercise and decreasing saturated fats to help prevent heart disease.  Obesity Leslie Wright is currently in the action stage of change. As such, her goal is to continue with weight loss efforts She has agreed to follow the Category 2 plan Leslie Wright has been instructed to work up to a goal of 150 minutes of  combined cardio and strengthening exercise per week for weight loss and overall health benefits. We discussed the following Behavioral Modification Strategies today: work on meal planning and easy cooking plans and ways to avoid boredom eating  Leslie Wright has agreed to follow up with our clinic in 2 weeks. She was informed of the importance of frequent follow up visits to maximize her success with intensive lifestyle modifications for her multiple health conditions.   OBESITY BEHAVIORAL INTERVENTION VISIT  Today's visit was # 39  Starting weight: 287 lb Starting date: 09/27/16 Today's weight : 274 lb Today's date: 07/30/2018 Total lbs lost to date: 13 lb   ASK: We discussed the diagnosis of obesity with Leslie Wright today and Leslie Wright agreed to give [place] permission to discuss obesity behavioral modification therapy today.  ASSESS: Leslie Wright has the diagnosis of obesity and her BMI today is 44.25 Leslie Wright is in the action stage of change   ADVISE: Leslie Wright was educated on the multiple health risks of obesity as well as the benefit of weight loss to improve her health. She was advised of the need for long term treatment and the importance of lifestyle modifications to improve her current health and to decrease her risk of future health problems.  AGREE: Multiple dietary modification options and treatment options were discussed and  Jelisha agreed to follow the recommendations documented in the above note.  ARRANGE: Sundee was educated on the importance of frequent visits to treat obesity as outlined per CMS and USPSTF guidelines and agreed to schedule her next follow up appointment today.  Leary Roca, am acting as transcriptionist for Abby Potash, PA-C I, Abby Potash, PA-C have reviewed above note and agree with its content

## 2018-08-13 ENCOUNTER — Encounter (INDEPENDENT_AMBULATORY_CARE_PROVIDER_SITE_OTHER): Payer: Self-pay | Admitting: Physician Assistant

## 2018-08-13 ENCOUNTER — Ambulatory Visit (INDEPENDENT_AMBULATORY_CARE_PROVIDER_SITE_OTHER): Payer: 59 | Admitting: Physician Assistant

## 2018-08-13 VITALS — BP 117/82 | HR 79 | Temp 98.1°F | Ht 66.0 in | Wt 279.0 lb

## 2018-08-13 DIAGNOSIS — E7849 Other hyperlipidemia: Secondary | ICD-10-CM | POA: Diagnosis not present

## 2018-08-13 DIAGNOSIS — E559 Vitamin D deficiency, unspecified: Secondary | ICD-10-CM | POA: Diagnosis not present

## 2018-08-13 DIAGNOSIS — Z9189 Other specified personal risk factors, not elsewhere classified: Secondary | ICD-10-CM

## 2018-08-13 DIAGNOSIS — I10 Essential (primary) hypertension: Secondary | ICD-10-CM

## 2018-08-13 DIAGNOSIS — Z6841 Body Mass Index (BMI) 40.0 and over, adult: Secondary | ICD-10-CM | POA: Diagnosis not present

## 2018-08-13 MED ORDER — ATORVASTATIN CALCIUM 20 MG PO TABS: 20.0000 mg | ORAL_TABLET | Freq: Every day | ORAL | 0 refills | Status: DC

## 2018-08-13 MED ORDER — VITAMIN D (ERGOCALCIFEROL) 1.25 MG (50000 UNIT) PO CAPS: 50000.0000 [IU] | ORAL_CAPSULE | ORAL | 0 refills | Status: DC

## 2018-08-13 MED ORDER — HYDROCHLOROTHIAZIDE 12.5 MG PO CAPS: 12.5000 mg | ORAL_CAPSULE | Freq: Every day | ORAL | 0 refills | Status: DC

## 2018-08-13 MED FILL — VIT D2 1.25 MG (50,000 UNIT: 1.25 MG | 28 days supply | Qty: 4 | Fill #0

## 2018-08-13 MED FILL — HYDROCHLOROTHIAZIDE 12.5 MG: 12.5 | 30 days supply | Qty: 30 | Fill #0

## 2018-08-13 MED FILL — ATORVASTATIN CALCIUM 20 MG: 20 | 30 days supply | Qty: 30 | Fill #0

## 2018-08-14 NOTE — Progress Notes (Signed)
Office: [phone]  /  Fax: [phone]   HPI:   Chief Complaint: OBESITY Leslie Wright is here to discuss her progress with her obesity treatment plan. She is on the Category 2 plan and is following her eating plan approximately 50 % of the time. She states she is exercising 0 minutes 0 times per week. Leslie Wright has been traveling more often and eating on the road. She has been eating mac and cheese at [employer], and not getting enough protein.  Her weight is 279 lb (126.6 kg) today and has gained 5 pounds since her last visit. She has lost 8 lbs since starting treatment with [place].  Hyperlipidemia Leslie Wright has hyperlipidemia and has been trying to improve her cholesterol levels with intensive lifestyle modification including a low saturated fat diet, exercise and weight loss. She is on Lipitor and denies any chest pain, claudication or myalgias.  Hypertension Leslie Wright is a 52 y.o. female with hypertension. Leslie Wright's blood pressure is normal. She is on hydrochlorothiazide and denies chest pain. She is working weight loss to help control her blood pressure with the goal of decreasing her risk of heart attack and stroke. Leslie Wright's blood pressure is currently controlled.  At risk for cardiovascular disease Leslie Wright is at a higher than average risk for cardiovascular disease due to obesity, hyperlipidemia, and hypertension. She currently denies any chest pain.  Vitamin D Deficiency Leslie Wright has a diagnosis of vitamin D deficiency. She is on prescription Vit D and denies nausea, vomiting or muscle weakness.  ALLERGIES: No Known Allergies  MEDICATIONS: Current Outpatient Medications on File Prior to Visit  Medication Sig Dispense Refill  . acetaminophen (TYLENOL) 650 MG CR tablet Take 650 mg by mouth See admin instructions. TAKE 1 TABLET (650 MG) BY MOUTH SCHEDULED IN THE MORNING, MAY REPEAT DOSE IN THE EVENING IF NEEDED FOR PAIN.    Marland Kitchen Cyanocobalamin (B-12) 2500 MCG TABS Take 2,500 mcg by mouth  daily.    . metFORMIN (GLUCOPHAGE) 500 MG tablet Take 1 tablet (500 mg total) by mouth daily with breakfast. 30 tablet 0  . rivaroxaban (XARELTO) 20 MG TABS tablet Take 20 mg by mouth daily with supper.     No current facility-administered medications on file prior to visit.     PAST MEDICAL HISTORY: Past Medical History:  Diagnosis Date  . Acid reflux   . Anemia   . Arthritis    "maybe in my fingers" (03/26/2018)  . Family history of adverse reaction to anesthesia    "brother, Otila Kluver, and my mom always get sick" (03/26/2018)  . Gallbladder problem   . High blood pressure   . High cholesterol   . Hip pain    "left; before OR" (03/26/2018)  . History of kidney stones   . OSA (obstructive sleep apnea)    "can't tolerate the mask" (03/26/2018)  . Pre-diabetes   . Shortness of breath   . Swelling of lower extremity    "that's why they put me on fluid pill" (03/26/2018)    PAST SURGICAL HISTORY: Past Surgical History:  Procedure Laterality Date  . CARPAL TUNNEL RELEASE Right ~ 2006  . EYE SURGERY    . JOINT REPLACEMENT    . LAPAROSCOPIC CHOLECYSTECTOMY  12/2010  . LASIK Bilateral 2000  . TOTAL HIP ARTHROPLASTY Left 03/25/2018   Procedure: LEFT TOTAL HIP ARTHROPLASTY ANTERIOR APPROACH;  Surgeon: Mcarthur Rossetti, MD;  Location: [facility];  Service: Orthopedics;  Laterality: Left;    SOCIAL HISTORY: Social History  Tobacco Use  . Smoking status: Never Smoker  . Smokeless tobacco: Never Used  Substance Use Topics  . Alcohol use: Not Currently  . Drug use: Never    FAMILY HISTORY: Family History  Problem Relation Age of Onset  . Diabetes Mother   . Thyroid disease Mother   . Liver disease Mother   . Obesity Mother   . Hypertension Father   . Hyperlipidemia Father   . Heart disease Father   . Stroke Father   . Obesity Father     ROS: Review of Systems  Constitutional: Negative for weight loss.  Cardiovascular: Negative for chest pain and claudication.    Gastrointestinal: Negative for nausea and vomiting.  Musculoskeletal: Negative for myalgias.       Negative muscle weakness    PHYSICAL EXAM: Blood pressure 117/82, pulse 79, temperature 98.1 F (36.7 C), temperature source Oral, height _0  (1.676 m), weight 279 lb (126.6 kg), SpO2 99 %. Body mass index is 45.03 kg/m. Physical Exam  Constitutional: She is oriented to person, place, and time. She appears well-developed and well-nourished.  Cardiovascular: Normal rate.  Pulmonary/Chest: Effort normal.  Musculoskeletal: Normal range of motion.  Neurological: She is oriented to person, place, and time.  Skin: Skin is warm and dry.  Psychiatric: She has a normal mood and affect. Her behavior is normal.  Vitals reviewed.   RECENT LABS AND TESTS: BMET    Component Value Date/Time   NA 140 07/02/2018 1159   K 4.3 07/02/2018 1159   CL 102 07/02/2018 1159   CO2 23 07/02/2018 1159   GLUCOSE 84 07/02/2018 1159   GLUCOSE 116 (H) 03/26/2018 0357   BUN 14 07/02/2018 1159   CREATININE 1.13 (H) 07/02/2018 1159   CALCIUM 9.3 07/02/2018 1159   GFRNONAA 56 (L) 07/02/2018 1159   GFRAA 64 07/02/2018 1159   Lab Results  Component Value Date   HGBA1C 5.7 (H) 07/02/2018   HGBA1C 5.4 03/13/2018   HGBA1C 5.6 11/06/2017   HGBA1C 5.6 07/15/2017   HGBA1C 5.5 04/11/2017   Lab Results  Component Value Date   INSULIN 27.9 (H) 07/02/2018   INSULIN 20.3 11/06/2017   INSULIN 22.2 07/15/2017   INSULIN 20.4 04/11/2017   INSULIN 16.9 12/25/2016   CBC    Component Value Date/Time   WBC 9.6 03/26/2018 0357   RBC 3.91 03/26/2018 0357   HGB 10.9 (L) 03/26/2018 0357   HGB 13.4 09/27/2016 1342   HCT 34.2 (L) 03/26/2018 0357   HCT 40.9 09/27/2016 1342   PLT 199 03/26/2018 0357   MCV 87.5 03/26/2018 0357   MCV 83 09/27/2016 1342   MCH 27.9 03/26/2018 0357   MCHC 31.9 03/26/2018 0357   RDW 14.3 03/26/2018 0357   RDW 15.9 (H) 09/27/2016 1342   LYMPHSABS 2.3 09/27/2016 1342   MONOABS 1.1 (H)  11/10/2010 0554   EOSABS 0.6 (H) 09/27/2016 1342   BASOSABS 0.0 09/27/2016 1342   Iron/TIBC/Ferritin/ %Sat No results found for: IRON, TIBC, FERRITIN, IRONPCTSAT Lipid Panel     Component Value Date/Time   CHOL 170 05/19/2018   CHOL 163 11/06/2017 0850   TRIG 119 05/19/2018   HDL 45 05/19/2018   HDL 36 (L) 11/06/2017 0850   LDLCALC 101 05/19/2018   LDLCALC 101 (H) 11/06/2017 0850   Hepatic Function Panel     Component Value Date/Time   PROT 6.9 07/02/2018 1159   ALBUMIN 4.2 07/02/2018 1159   AST 16 07/02/2018 1159   ALT 14 07/02/2018 1159  ALKPHOS 84 07/02/2018 1159   BILITOT <0.2 07/02/2018 1159   BILIDIR 0.1 11/09/2010 1645   IBILI 0.4 11/09/2010 1645      Component Value Date/Time   TSH 1.890 09/27/2016 1342   Results for VAIL, BASISTA "KAY" (MRN [id number]) as of 08/14/2018 11:26  Ref. Range 07/02/2018 11:59  Vitamin D, 25-Hydroxy Latest Ref Range: 30.0 - 100.0 ng/mL 30.3   ASSESSMENT AND PLAN: Other hyperlipidemia - Plan: atorvastatin (LIPITOR) 20 MG tablet  Essential hypertension - Plan: hydrochlorothiazide (MICROZIDE) 12.5 MG capsule  Vitamin D deficiency - Plan: Vitamin D, Ergocalciferol, (DRISDOL) 1.25 MG (50000 UT) CAPS capsule  At risk for heart disease  Class 3 severe obesity with serious comorbidity and body mass index (BMI) of 45.0 to 49.9 in adult, unspecified obesity type (Leslie Wright)  PLAN:  Hyperlipidemia Leslie Wright was informed of the American Heart Association Guidelines emphasizing intensive lifestyle modifications as the first line treatment for hyperlipidemia. We discussed many lifestyle modifications today in depth, and Leslie Wright will continue to work on decreasing saturated fats such as fatty red meat, butter and many fried foods. She will also increase vegetables and lean protein in her diet and continue to work on exercise and weight loss efforts. Leslie Wright agrees to continue taking Lipitor 20 mg qd #30 and we will refill for 1 month. Leslie Wright agrees to  follow up with our clinic in 3 weeks.  Hypertension We discussed sodium restriction, working on healthy weight loss, and a regular exercise program as the means to achieve improved blood pressure control. Leslie Wright agreed with this plan and agreed to follow up as directed. We will continue to monitor her blood pressure as well as her progress with the above lifestyle modifications. Leslie Wright agrees to continue taking hydrochlorothiazide 12.5 mg qd #30 and we will refill for 1 month. She will watch for signs of hypotension as she continues her lifestyle modifications. Leslie Wright agrees to follow up with our clinic in 3 weeks.  Cardiovascular risk counselling Leslie Wright was given extended (15 minutes) coronary artery disease prevention counseling today. She is 52 y.o. female and has risk factors for heart disease including obesity, hyperlipidemia, and hypertension. We discussed intensive lifestyle modifications today with an emphasis on specific weight loss instructions and strategies. Pt was also informed of the importance of increasing exercise and decreasing saturated fats to help prevent heart disease.  Vitamin D Deficiency Leslie Wright was informed that low vitamin D levels contributes to fatigue and are associated with obesity, breast, and colon cancer. Leslie Wright agrees to continue taking prescription Vit D _0 ,000 IU every week #4 and we will refill for 1 month. She will follow up for routine testing of vitamin D, at least 2-3 times per year. She was informed of the risk of over-replacement of vitamin D and agrees to not increase her dose unless she discusses this with [place] first. Leslie Wright agrees to follow up with our clinic in 3 weeks.  Obesity Leslie Wright is currently in the action stage of change. As such, her goal is to continue with weight loss efforts She has agreed to follow the Category 2 plan Leslie Wright has been instructed to work up to a goal of 150 minutes of combined cardio and strengthening exercise per week for weight  loss and overall health benefits. We discussed the following Behavioral Modification Strategies today: increasing lean protein intake and work on meal planning and easy cooking plans   Leslie Wright has agreed to follow up with our clinic in 3 weeks. She was informed of the importance of  frequent follow up visits to maximize her success with intensive lifestyle modifications for her multiple health conditions.   OBESITY BEHAVIORAL INTERVENTION VISIT  Today's visit was # 40  Starting weight: 287 lbs Starting date: 09/27/16 Today's weight : 279 lbs  Today's date: 08/13/2018 Total lbs lost to date: 8    ASK: We discussed the diagnosis of obesity with Ladona Ridgel today and Rosaria Ferries agreed to give [place] permission to discuss obesity behavioral modification therapy today.  ASSESS: Jorene has the diagnosis of obesity and her BMI today is 45.05 Xzandria is in the action stage of change   ADVISE: [place] was educated on the multiple health risks of obesity as well as the benefit of weight loss to improve her health. She was advised of the need for long term treatment and the importance of lifestyle modifications.  AGREE: Multiple dietary modification options and treatment options were discussed and  Kennadi agreed to the above obesity treatment plan.  Wilhemena Durie, am acting as transcriptionist for Abby Potash, PA-C I, Abby Potash, PA-C have reviewed above note and agree with its content

## 2018-08-18 ENCOUNTER — Ambulatory Visit (INDEPENDENT_AMBULATORY_CARE_PROVIDER_SITE_OTHER): Payer: 59 | Admitting: Orthopaedic Surgery

## 2018-08-18 DIAGNOSIS — I10 Essential (primary) hypertension: Secondary | ICD-10-CM | POA: Diagnosis not present

## 2018-08-18 DIAGNOSIS — E119 Type 2 diabetes mellitus without complications: Secondary | ICD-10-CM | POA: Diagnosis not present

## 2018-08-18 DIAGNOSIS — E782 Mixed hyperlipidemia: Secondary | ICD-10-CM | POA: Diagnosis not present

## 2018-08-18 DIAGNOSIS — E559 Vitamin D deficiency, unspecified: Secondary | ICD-10-CM | POA: Diagnosis not present

## 2018-08-22 DIAGNOSIS — Z6841 Body Mass Index (BMI) 40.0 and over, adult: Secondary | ICD-10-CM | POA: Diagnosis not present

## 2018-08-22 DIAGNOSIS — E782 Mixed hyperlipidemia: Secondary | ICD-10-CM | POA: Diagnosis not present

## 2018-08-22 DIAGNOSIS — I10 Essential (primary) hypertension: Secondary | ICD-10-CM | POA: Diagnosis not present

## 2018-08-22 DIAGNOSIS — E119 Type 2 diabetes mellitus without complications: Secondary | ICD-10-CM | POA: Diagnosis not present

## 2018-08-22 DIAGNOSIS — E559 Vitamin D deficiency, unspecified: Secondary | ICD-10-CM | POA: Diagnosis not present

## 2018-08-25 ENCOUNTER — Encounter: Payer: Self-pay | Admitting: Nurse Practitioner

## 2018-09-03 ENCOUNTER — Encounter: Payer: Self-pay | Admitting: Nurse Practitioner

## 2018-09-03 ENCOUNTER — Ambulatory Visit (INDEPENDENT_AMBULATORY_CARE_PROVIDER_SITE_OTHER): Payer: 59 | Admitting: Nurse Practitioner

## 2018-09-03 ENCOUNTER — Ambulatory Visit (INDEPENDENT_AMBULATORY_CARE_PROVIDER_SITE_OTHER): Payer: 59 | Admitting: Physician Assistant

## 2018-09-03 VITALS — BP 128/70 | HR 72 | Ht 67.0 in | Wt 280.0 lb

## 2018-09-03 DIAGNOSIS — K219 Gastro-esophageal reflux disease without esophagitis: Secondary | ICD-10-CM

## 2018-09-03 DIAGNOSIS — Z1211 Encounter for screening for malignant neoplasm of colon: Secondary | ICD-10-CM | POA: Diagnosis not present

## 2018-09-03 MED ORDER — PEG 3350-KCL-NA BICARB-NACL 420 G PO SOLR: 4000.0000 mL | Freq: Once | ORAL | 0 refills | Status: AC

## 2018-09-03 MED FILL — PEG-3350 SOLUTION: 420 | 1 days supply | Qty: 4000 | Fill #0

## 2018-09-03 NOTE — Progress Notes (Signed)
Agree with assessment and plan 

## 2018-09-03 NOTE — Patient Instructions (Addendum)
If you are age 53 or older, your body mass index should be between 23-30. Your Body mass index is 43.85 kg/m. If this is out of the aforementioned range listed, please consider follow up with your Primary Care Provider.  If you are age 42 or younger, your body mass index should be between 19-25. Your Body mass index is 43.85 kg/m. If this is out of the aformentioned range listed, please consider follow up with your Primary Care Provider.   You have been scheduled for a colonoscopy. Please follow written instructions given to you at your visit today.  Please pick up your prep supplies at the pharmacy within the next 1-3 days. If you use inhalers (even only as needed), please bring them with you on the day of your procedure. Your physician has requested that you go to www.startemmi.com and enter the access code given to you at your visit today. This web site gives a general overview about your procedure. However, you should still follow specific instructions given to you by our office regarding your preparation for the procedure.  We have sent the following medications to your pharmacy for you to pick up at your convenience: Golytely  You have been given GERD Literature.  Thank you for choosing me and [facility].   Tye Savoy, NP

## 2018-09-03 NOTE — Progress Notes (Signed)
ASSESSMENT / PLAN:    76.  52 year old female here for colon cancer screening.  PCP had scheduled her previously for this but due to need for hip replacement this appointment was rescheduled for today.  No overt GI bleeding, bowel changes or unexplained weight loss.  As of her last labs drawn late June she did have mild normocytic anemia.  -Patient will be scheduled for screening colonoscopy. The risks and benefits of colonoscopy with possible polypectomy were discussed and the patient agrees to proceed.   2. [place] anemia, as of most recent labs drawn in late June. No overt GI bleeding  3. GERD.  Symptomatic about twice a week for which she takes omeprazole as needed.  Symptoms worse with spicy foods or chocolates -Antireflux measures discussed including going to bed on an empty stomach, elevating the head of bed or getting a wedge pillow, limiting caffeine consumption.  Written GERD literature given  4.  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  Patient Xarelto but due to complete therapy in 1 week.  She will be finished with the Xarelto by the time of colonoscopy   HPI:    Chief complaint: Colon cancer screening  Patient is a 52 year old female, new to this practice .  She has a history of hyperlipidemia, obesity, hypertension, vitamin D deficiency and "pre-diabetes" with mildly elevated A1c.  Patient is referred by PCP Dr. Ernie Hew for colon cancer screening.  Patient has never had a colonoscopy.  She has no family history of colon cancer.  Patient has not had any bowel changes, blood in stool, abnormal weight loss, etc. she has no general medical complaints except for shortness of breath which we discussed.  She gets shortness of breath with overexertion .  No chest pain . Shee is mildly anemic based on her labs in late June.  Her MCV was normal.   Data Reviewed:  07/02/18 labs: Creatinine 1.13, CMET otherwise normal 03/26/2018 : Hemoglobin 10.9, MCV  87    Past Medical History:  Diagnosis Date  . Acid reflux   . Anemia   . Arthritis    "maybe in my fingers" (03/26/2018)  . Family history of adverse reaction to anesthesia    "brother, Otila Kluver, and my mom always get sick" (03/26/2018)  . Gallbladder problem   . High blood pressure   . High cholesterol   . Hip pain    "left; before OR" (03/26/2018)  . History of kidney stones   . OSA (obstructive sleep apnea)    "can't tolerate the mask" (03/26/2018)  . Pre-diabetes   . Shortness of breath   . Swelling of lower extremity    "that's why they put me on fluid pill" (03/26/2018)     Past Surgical History:  Procedure Laterality Date  . CARPAL TUNNEL RELEASE Right ~ 2006  . EYE SURGERY    . JOINT REPLACEMENT    . LAPAROSCOPIC CHOLECYSTECTOMY  12/2010  . LASIK Bilateral 2000  . TOTAL HIP ARTHROPLASTY Left 03/25/2018   Procedure: LEFT TOTAL HIP ARTHROPLASTY ANTERIOR APPROACH;  Surgeon: Mcarthur Rossetti, MD;  Location: [facility];  Service: Orthopedics;  Laterality: Left;   Family History  Problem Relation Age of Onset  . Diabetes Mother   . Thyroid disease Mother   . Liver disease Mother   . Obesity Mother   . Hypertension Father   . Hyperlipidemia Father   .  Heart disease Father   . Stroke Father   . Obesity Father   . Colon polyps Father   . Prostate cancer Brother   . Colon cancer Neg Hx        No one in family with colon cancer.   Social History   Tobacco Use  . Smoking status: Never Smoker  . Smokeless tobacco: Never Used  Substance Use Topics  . Alcohol use: Not Currently  . Drug use: Never   Current Outpatient Medications  Medication Sig Dispense Refill  . acetaminophen (TYLENOL) 650 MG CR tablet Take 650 mg by mouth See admin instructions. TAKE 1 TABLET (650 MG) BY MOUTH SCHEDULED IN THE MORNING, MAY REPEAT DOSE IN THE EVENING IF NEEDED FOR PAIN.    Marland Kitchen atorvastatin (LIPITOR) 20 MG tablet Take 1 tablet (20 mg total) by mouth daily. 30 tablet 0  .  Cyanocobalamin (B-12) 2500 MCG TABS Take 2,500 mcg by mouth daily.    . hydrochlorothiazide (MICROZIDE) 12.5 MG capsule Take 1 capsule (12.5 mg total) by mouth daily. 30 capsule 0  . metFORMIN (GLUCOPHAGE) 500 MG tablet Take 1 tablet (500 mg total) by mouth daily with breakfast. 30 tablet 0  . rivaroxaban (XARELTO) 20 MG TABS tablet Take 20 mg by mouth daily with supper.    . Vitamin D, Ergocalciferol, (DRISDOL) 1.25 MG (50000 UT) CAPS capsule Take 1 capsule (50,000 Units total) by mouth every 7 (seven) days. 4 capsule 0   No current facility-administered medications for this visit.    No Known Allergies   Review of Systems: Positive for allergy/sinus trouble, back pain, fatigue, headaches, shortness of breath, swelling of feet legs, excessive thirst and excessive urination.  All other systems reviewed and negative except where noted in HPI.   Creatinine clearance cannot be calculated (Patient's most recent lab result is older than the maximum 21 days allowed.)   Physical Exam:    Wt Readings from Last 3 Encounters:  09/03/18 280 lb (127 kg)  08/13/18 279 lb (126.6 kg)  07/30/18 274 lb (124.3 kg)    BP 128/70   Pulse 72   Ht 5\' 7"  (1.702 m)   Wt 280 lb (127 kg)   BMI 43.85 kg/m  Constitutional:  Pleasant female in no acute distress. Psychiatric: Normal mood and affect. Behavior is normal. EENT: Pupils normal.  Conjunctivae are normal. No scleral icterus. Neck supple.  Cardiovascular: Normal rate, regular rhythm. No edema Pulmonary/chest: Effort normal and breath sounds normal. No wheezing, rales or rhonchi. Abdominal: Soft, nondistended, nontender. Bowel sounds active throughout. There are no masses palpable. No hepatomegaly. Neurological: Alert and oriented to person place and time. Skin: Skin is warm and dry. No rashes noted.  Tye Savoy, NP  09/03/2018, 10:09 AM  Cc: Fanny Bien, MD

## 2018-09-08 ENCOUNTER — Encounter (INDEPENDENT_AMBULATORY_CARE_PROVIDER_SITE_OTHER): Payer: Self-pay | Admitting: Orthopaedic Surgery

## 2018-09-08 ENCOUNTER — Ambulatory Visit (INDEPENDENT_AMBULATORY_CARE_PROVIDER_SITE_OTHER): Payer: 59 | Admitting: Orthopaedic Surgery

## 2018-09-08 ENCOUNTER — Ambulatory Visit (INDEPENDENT_AMBULATORY_CARE_PROVIDER_SITE_OTHER): Payer: Self-pay

## 2018-09-08 DIAGNOSIS — I82411 Acute embolism and thrombosis of right femoral vein: Secondary | ICD-10-CM | POA: Diagnosis not present

## 2018-09-08 DIAGNOSIS — M25561 Pain in right knee: Secondary | ICD-10-CM

## 2018-09-08 DIAGNOSIS — Z96642 Presence of left artificial hip joint: Secondary | ICD-10-CM | POA: Diagnosis not present

## 2018-09-08 MED ORDER — LIDOCAINE HCL 1 % IJ SOLN
3.0000 mL | INTRAMUSCULAR | Status: AC | PRN
  Administered 2018-09-08: 3 mL

## 2018-09-08 MED ORDER — METHYLPREDNISOLONE ACETATE 40 MG/ML IJ SUSP
40.0000 mg | INTRAMUSCULAR | Status: AC | PRN
  Administered 2018-09-08: 40 mg via INTRA_ARTICULAR

## 2018-09-08 NOTE — Progress Notes (Signed)
   Procedure Note  Patient: Leslie Wright             Date of Birth: 05-Aug-1966           MRN: [id number]             Visit Date: 09/08/2018 HPI: Zigmund Daniel returns today follow-up of her left total hip arthroplasty is now 5 months postop.  States the hip is overall doing well.  She again unfortunately developed a right lower leg DVT.  She has had increased pain in the right leg since the surgery and states she is unable to bring the knee up for extension.  She has had no injury to the knee.  She denies any fevers chills shortness of breath chest pain.  Physical exam: General well-developed well-nourished female no acute distress mood and affect appropriate  Left hip: Full range of motion without pain.  Right knee full extension.  Good range of motion with significant patellofemoral crepitus.  No instability valgus varus stressing.  Radiographs:AP pelvis lateral view of the left hip: No acute fractures.  Well-seated left total hip components without any evidence of hardware failure.  Bilateral hips located.  Procedures: Visit Diagnoses: Pain in left hip - Plan: XR HIP UNILAT W OR W/O PELVIS 2-3 VIEWS LEFT  DVT of deep femoral vein, right (HCC) - Plan: VAS [place] LOWER EXTREMITY VENOUS (DVT)  Large Joint Inj: R knee on 09/08/2018 5:10 PM Indications: pain Details: 22 G 1.5 in needle, anterolateral approach  Arthrogram: No  Medications: 3 mL lidocaine 1 %; 40 mg methylPREDNISolone acetate 40 MG/ML Outcome: tolerated well, no immediate complications Procedure, treatment alternatives, risks and benefits explained, specific risks discussed. Consent was given by the patient. Immediately prior to procedure a time out was called to verify the correct patient, procedure, equipment, support staff and site/side marked as required. Patient was prepped and draped in the usual sterile fashion.     Plan: We will send her for a Doppler of the right lower leg to reevaluate the DVT.  See how she does with the right  knee injection.  We will see her back in 6 months for reevaluation.  As she does work here she can always discuss any concerns or questions with [place].

## 2018-09-09 ENCOUNTER — Other Ambulatory Visit (INDEPENDENT_AMBULATORY_CARE_PROVIDER_SITE_OTHER): Payer: Self-pay | Admitting: Physician Assistant

## 2018-09-09 DIAGNOSIS — I82411 Acute embolism and thrombosis of right femoral vein: Secondary | ICD-10-CM

## 2018-09-10 ENCOUNTER — Ambulatory Visit (HOSPITAL_COMMUNITY)
Admission: RE | Admit: 2018-09-10 | Discharge: 2018-09-10 | Disposition: A | Discharge status: Home / Self Care | Payer: 59 | Source: Ambulatory Visit | Attending: Cardiovascular Disease | Admitting: Cardiovascular Disease

## 2018-09-10 ENCOUNTER — Encounter (HOSPITAL_COMMUNITY): Payer: 59

## 2018-09-10 ENCOUNTER — Other Ambulatory Visit (INDEPENDENT_AMBULATORY_CARE_PROVIDER_SITE_OTHER): Payer: Self-pay

## 2018-09-10 DIAGNOSIS — I82411 Acute embolism and thrombosis of right femoral vein: Secondary | ICD-10-CM | POA: Insufficient documentation

## 2018-09-12 ENCOUNTER — Telehealth (INDEPENDENT_AMBULATORY_CARE_PROVIDER_SITE_OTHER): Payer: Self-pay

## 2018-09-12 NOTE — Telephone Encounter (Signed)
Called Zigmund Daniel to advise on Doppler results. No answer LMOM to return my call

## 2018-09-15 ENCOUNTER — Other Ambulatory Visit (INDEPENDENT_AMBULATORY_CARE_PROVIDER_SITE_OTHER): Payer: Self-pay | Admitting: Physician Assistant

## 2018-09-15 ENCOUNTER — Telehealth: Payer: Self-pay | Admitting: Gastroenterology

## 2018-09-15 MED ORDER — RIVAROXABAN 20 MG PO TABS: 20.0000 mg | ORAL_TABLET | Freq: Every day | ORAL | 2 refills | Status: DC

## 2018-09-15 MED FILL — XARELTO 20 MG TABLET: 20 | 30 days supply | Qty: 30 | Fill #0

## 2018-09-15 NOTE — Telephone Encounter (Signed)
Patient is scheduled for procedure on 12/24 states that her pcp advised for her to stay on bt because she still has a blood clot in her leg. Please advise

## 2018-09-15 NOTE — Telephone Encounter (Signed)
Leslie Wright, Thank you for reaching out. If the patient is remaining on her DVT treatment because she still has a blood clot then we need to make a decision as to whether the patient understands doing a colonoscopy without ability to remove polyps is what she wants if we find them or if she wants to wait until she can come off of therapy. I did not see imaging showing persistent clot but maybe it is in another system as PCP is not in this system. Nevin Bloodgood, since it looks like she was going to come off of her DVT medication around the time he had seen her in clinic do think you can reach out to her and let her know about moving forward with screening but being unable to remove polyps if she ends up having any as well as increased risk of bleeding? Thanks. Valarie Merino

## 2018-09-15 NOTE — Telephone Encounter (Signed)
Read the note to the patient. She understands her choices. She opts to wait until she is off the anticoagulation to schedule her colonoscopy.

## 2018-09-17 ENCOUNTER — Encounter (INDEPENDENT_AMBULATORY_CARE_PROVIDER_SITE_OTHER): Payer: Self-pay | Admitting: Physician Assistant

## 2018-09-17 ENCOUNTER — Ambulatory Visit (INDEPENDENT_AMBULATORY_CARE_PROVIDER_SITE_OTHER): Payer: 59 | Admitting: Physician Assistant

## 2018-09-17 VITALS — BP 120/80 | HR 80 | Temp 97.7°F | Ht 66.0 in | Wt 280.0 lb

## 2018-09-17 DIAGNOSIS — I10 Essential (primary) hypertension: Secondary | ICD-10-CM | POA: Diagnosis not present

## 2018-09-17 DIAGNOSIS — E7849 Other hyperlipidemia: Secondary | ICD-10-CM | POA: Diagnosis not present

## 2018-09-17 DIAGNOSIS — E559 Vitamin D deficiency, unspecified: Secondary | ICD-10-CM | POA: Diagnosis not present

## 2018-09-17 DIAGNOSIS — Z6841 Body Mass Index (BMI) 40.0 and over, adult: Secondary | ICD-10-CM

## 2018-09-17 DIAGNOSIS — R7303 Prediabetes: Secondary | ICD-10-CM | POA: Diagnosis not present

## 2018-09-17 DIAGNOSIS — Z9189 Other specified personal risk factors, not elsewhere classified: Secondary | ICD-10-CM

## 2018-09-17 MED ORDER — ATORVASTATIN CALCIUM 20 MG PO TABS: 20.0000 mg | ORAL_TABLET | Freq: Every day | ORAL | 0 refills | Status: DC

## 2018-09-17 MED ORDER — HYDROCHLOROTHIAZIDE 12.5 MG PO CAPS: 12.5000 mg | ORAL_CAPSULE | Freq: Every day | ORAL | 0 refills | Status: DC

## 2018-09-17 MED ORDER — VITAMIN D (ERGOCALCIFEROL) 1.25 MG (50000 UNIT) PO CAPS: 50000.0000 [IU] | ORAL_CAPSULE | ORAL | 0 refills | Status: DC

## 2018-09-17 MED ORDER — METFORMIN HCL 500 MG PO TABS: 500.0000 mg | ORAL_TABLET | Freq: Every day | ORAL | 0 refills | Status: DC

## 2018-09-17 MED FILL — ATORVASTATIN CALCIUM 20 MG: 20 | 30 days supply | Qty: 30 | Fill #0

## 2018-09-17 MED FILL — HYDROCHLOROTHIAZIDE 12.5 MG: 12.5 | 30 days supply | Qty: 30 | Fill #0

## 2018-09-17 MED FILL — VIT D2 1.25 MG (50,000 UNIT: 1.25 MG | 28 days supply | Qty: 4 | Fill #0

## 2018-09-17 MED FILL — metFORMIN HCL 500 MG TABS: 500 | 30 days supply | Qty: 30 | Fill #0

## 2018-09-17 NOTE — Progress Notes (Signed)
Office: [phone]  /  Fax: [phone]   HPI:   Chief Complaint: OBESITY Leslie Wright is here to discuss her progress with her obesity treatment plan. She is on the Category 2 plan and is following her eating plan approximately 10 % of the time. She states she is exercising 0 minutes 0 times per week. Leslie Wright reports that she has been on the road Leslie Wright lot and has been eating out. She is not currently ready to follow the plan and she would like Leslie Wright little more freedom due to recent life events. Her weight is 280 lb (127 kg) today and has had Leslie Wright weight gain of 1 pound over Leslie Wright period of 5 weeks since her last visit. She has lost 7 lbs since starting treatment with [place].  Hyperlipidemia Leslie Wright has hyperlipidemia and she is on Atorvastatin. She has been trying to improve her cholesterol levels with intensive lifestyle modification including Leslie Wright low saturated fat diet, exercise and weight loss. She denies any chest pain.  Hypertension Leslie Wright is Leslie Wright 52 y.o. female with hypertension. She is currently on HCTZ. Leslie Wright denies chest pain. She is working weight loss to help control her blood pressure with the goal of decreasing her risk of heart attack and stroke. Leslie Wright blood pressure is currently controlled.  At risk for cardiovascular disease Leslie Wright is at Leslie Wright higher than average risk for cardiovascular disease due to obesity, hyperlipidemia and hypertension. She currently denies any chest pain.  Vitamin D deficiency Leslie Wright has Leslie Wright diagnosis of vitamin D deficiency. She is currently taking prescription vit D and denies nausea, vomiting or muscle weakness.  Pre-Diabetes Leslie Wright has Leslie Wright diagnosis of prediabetes based on her elevated Hgb A1c and was informed this puts her at greater risk of developing diabetes. She has no nausea, vomiting or diarrhea on metformin. Leslie Wright continues to work on diet and exercise to decrease risk of diabetes. She denies polyphagia or hypoglycemia.  ASSESSMENT AND PLAN:  Other  hyperlipidemia - Plan: atorvastatin (LIPITOR) 20 MG tablet  Vitamin D deficiency - Plan: Vitamin D, Ergocalciferol, (DRISDOL) 1.25 MG (50000 UT) CAPS capsule  Essential hypertension - Plan: hydrochlorothiazide (MICROZIDE) 12.5 MG capsule  Prediabetes - Plan: metFORMIN (GLUCOPHAGE) 500 MG tablet  At risk for heart disease  Class 3 severe obesity with serious comorbidity and body mass index (BMI) of 45.0 to 49.9 in adult, unspecified obesity type (Leslie Wright)  PLAN:  Hyperlipidemia Leslie Wright was informed of the American Heart Association Guidelines emphasizing intensive lifestyle modifications as the first line treatment for hyperlipidemia. We discussed many lifestyle modifications today in depth, and Leslie Wright will continue to work on decreasing saturated fats such as fatty red meat, butter and many fried foods. She will also increase vegetables and lean protein in her diet and continue to work on exercise and weight loss efforts. Rejeana agrees to continue Atorvastatin 20 mg daily #30 with no refills and follow up as directed.  Hypertension We discussed sodium restriction, working on healthy weight loss, and Leslie Wright regular exercise program as the means to achieve improved blood pressure control. Leslie Wright agreed with this plan and agreed to follow up as directed. We will continue to monitor her blood pressure as well as her progress with the above lifestyle modifications. She will continue her medications as prescribed and will watch for signs of hypotension as she continues her lifestyle modifications.  Cardiovascular risk counseling Leslie Wright was given extended (15 minutes) coronary artery disease prevention counseling today. She is 53 y.o. female and has risk factors  for heart disease including obesity. We discussed intensive lifestyle modifications today with an emphasis on specific weight loss instructions and strategies. Pt was also informed of the importance of increasing exercise and decreasing saturated fats to  help prevent heart disease.  Vitamin D Deficiency Leslie Wright was informed that low vitamin D levels contributes to fatigue and are associated with obesity, breast, and colon cancer. She agrees to continue to take prescription Vit D @50 ,000 IU every week #4 with no refills and will follow up for routine testing of vitamin D, at least 2-3 times per year. She was informed of the risk of over-replacement of vitamin D and agrees to not increase her dose unless she discusses this with [place] first. Leslie Wright agrees to follow up as directed.  Pre-Diabetes Leslie Wright will continue to work on weight loss, exercise, and decreasing simple carbohydrates in her diet to help decrease the risk of diabetes. We dicussed metformin including benefits and risks. She was informed that eating too many simple carbohydrates or too many calories at one sitting increases the likelihood of GI side effects. Leslie Wright agrees to continue metformin 500 mg qd #30 with no refills and follow up with [place] as directed to monitor her progress.  Obesity Leslie Wright is not currently in the action stage of change. As such, her goal is to maintain weight for now She has agreed to change to portion control better and make smarter food choices, such as increase vegetables and decrease simple carbohydrates  Leslie Wright has been instructed to work up to Leslie Wright goal of 150 minutes of combined cardio and strengthening exercise per week for weight loss and overall health benefits. We discussed the following Behavioral Modification Strategies today: increasing lean protein intake and work on meal planning and easy cooking plans  Leslie Wright has agreed to follow up with our clinic in 3 weeks. She was informed of the importance of frequent follow up visits to maximize her success with intensive lifestyle modifications for her multiple health conditions.  ALLERGIES: No Known Allergies  MEDICATIONS: Current Outpatient Medications on File Prior to Visit  Medication Sig Dispense Refill    . acetaminophen (TYLENOL) 650 MG CR tablet Take 650 mg by mouth See admin instructions. TAKE 1 TABLET (650 MG) BY MOUTH SCHEDULED IN THE MORNING, MAY REPEAT DOSE IN THE EVENING IF NEEDED FOR PAIN.    Marland Kitchen Cyanocobalamin (B-12) 2500 MCG TABS Take 2,500 mcg by mouth daily.    . rivaroxaban (XARELTO) 20 MG TABS tablet Take 1 tablet (20 mg total) by mouth daily with supper. 30 tablet 2   No current facility-administered medications on file prior to visit.     PAST MEDICAL HISTORY: Past Medical History:  Diagnosis Date  . Acid reflux   . Anemia   . Arthritis    "maybe in my fingers" (03/26/2018)  . Family history of adverse reaction to anesthesia    "brother, Otila Kluver, and my mom always get sick" (03/26/2018)  . Gallbladder problem   . High blood pressure   . High cholesterol   . Hip pain    "left; before OR" (03/26/2018)  . History of kidney stones   . OSA (obstructive sleep apnea)    "can't tolerate the mask" (03/26/2018)  . Pre-diabetes   . Shortness of breath   . Swelling of lower extremity    "that's why they put me on fluid pill" (03/26/2018)    PAST SURGICAL HISTORY: Past Surgical History:  Procedure Laterality Date  . CARPAL TUNNEL RELEASE Right ~ 2006  .  EYE SURGERY    . JOINT REPLACEMENT    . LAPAROSCOPIC CHOLECYSTECTOMY  12/2010  . LASIK Bilateral 2000  . TOTAL HIP ARTHROPLASTY Left 03/25/2018   Procedure: LEFT TOTAL HIP ARTHROPLASTY ANTERIOR APPROACH;  Surgeon: Mcarthur Rossetti, MD;  Location: [facility];  Service: Orthopedics;  Laterality: Left;    SOCIAL HISTORY: Social History   Tobacco Use  . Smoking status: Never Smoker  . Smokeless tobacco: Never Used  Substance Use Topics  . Alcohol use: Not Currently  . Drug use: Never    FAMILY HISTORY: Family History  Problem Relation Age of Onset  . Diabetes Mother   . Thyroid disease Mother   . Liver disease Mother   . Obesity Mother   . Hypertension Father   . Hyperlipidemia Father   . Heart disease Father    . Stroke Father   . Obesity Father   . Colon polyps Father   . Prostate cancer Brother   . Colon cancer Neg Hx        No one in family with colon cancer.    ROS: Review of Systems  Constitutional: Negative for weight loss.  Cardiovascular: Negative for chest pain.  Gastrointestinal: Negative for nausea and vomiting.  Musculoskeletal:       Negative for muscle weakness  Endo/Heme/Allergies:       Negative for polyphagia Negative for hypoglycemia    PHYSICAL EXAM: Blood pressure 120/80, pulse 80, temperature 97.7 F (36.5 C), temperature source Oral, height 5\' 6"  (1.676 m), weight 280 lb (127 kg), SpO2 97 %. Body mass index is 45.19 kg/m. Physical Exam Vitals signs reviewed.  Constitutional:      Appearance: Normal appearance. She is well-developed. She is obese.  Cardiovascular:     Rate and Rhythm: Normal rate.  Pulmonary:     Effort: Pulmonary effort is normal.  Musculoskeletal: Normal range of motion.  Skin:    General: Skin is warm and dry.  Neurological:     Mental Status: She is alert and oriented to person, place, and time.  Psychiatric:        Mood and Affect: Mood normal.        Behavior: Behavior normal.     RECENT LABS AND TESTS: BMET    Component Value Date/Time   NA 140 07/02/2018 1159   K 4.3 07/02/2018 1159   CL 102 07/02/2018 1159   CO2 23 07/02/2018 1159   GLUCOSE 84 07/02/2018 1159   GLUCOSE 116 (H) 03/26/2018 0357   BUN 14 07/02/2018 1159   CREATININE 1.13 (H) 07/02/2018 1159   CALCIUM 9.3 07/02/2018 1159   GFRNONAA 56 (L) 07/02/2018 1159   GFRAA 64 07/02/2018 1159   Lab Results  Component Value Date   HGBA1C 5.7 (H) 07/02/2018   HGBA1C 5.4 03/13/2018   HGBA1C 5.6 11/06/2017   HGBA1C 5.6 07/15/2017   HGBA1C 5.5 04/11/2017   Lab Results  Component Value Date   INSULIN 27.9 (H) 07/02/2018   INSULIN 20.3 11/06/2017   INSULIN 22.2 07/15/2017   INSULIN 20.4 04/11/2017   INSULIN 16.9 12/25/2016   CBC    Component Value  Date/Time   WBC 9.6 03/26/2018 0357   RBC 3.91 03/26/2018 0357   HGB 10.9 (L) 03/26/2018 0357   HGB 13.4 09/27/2016 1342   HCT 34.2 (L) 03/26/2018 0357   HCT 40.9 09/27/2016 1342   PLT 199 03/26/2018 0357   MCV 87.5 03/26/2018 0357   MCV 83 09/27/2016 1342   MCH 27.9 03/26/2018 0357  MCHC 31.9 03/26/2018 0357   RDW 14.3 03/26/2018 0357   RDW 15.9 (H) 09/27/2016 1342   LYMPHSABS 2.3 09/27/2016 1342   MONOABS 1.1 (H) 11/10/2010 0554   EOSABS 0.6 (H) 09/27/2016 1342   BASOSABS 0.0 09/27/2016 1342   Iron/TIBC/Ferritin/ %Sat No results found for: IRON, TIBC, FERRITIN, IRONPCTSAT Lipid Panel     Component Value Date/Time   CHOL 170 05/19/2018   CHOL 163 11/06/2017 0850   TRIG 119 05/19/2018   HDL 45 05/19/2018   HDL 36 (L) 11/06/2017 0850   LDLCALC 101 05/19/2018   LDLCALC 101 (H) 11/06/2017 0850   Hepatic Function Panel     Component Value Date/Time   PROT 6.9 07/02/2018 1159   ALBUMIN 4.2 07/02/2018 1159   AST 16 07/02/2018 1159   ALT 14 07/02/2018 1159   ALKPHOS 84 07/02/2018 1159   BILITOT <0.2 07/02/2018 1159   BILIDIR 0.1 11/09/2010 1645   IBILI 0.4 11/09/2010 1645      Component Value Date/Time   TSH 1.890 09/27/2016 1342    Ref. Range 07/02/2018 11:59  Vitamin D, 25-Hydroxy Latest Ref Range: 30.0 - 100.0 ng/mL 30.3     OBESITY BEHAVIORAL INTERVENTION VISIT  Today's visit was # 41  Starting weight: 287 lbs Starting date: 09/27/2016 Today's weight : 280 lbs Today's date: 09/17/2018 Total lbs lost to date: 7   ASK: We discussed the diagnosis of obesity with Leslie Wright today and Leslie Wright agreed to give [place] permission to discuss obesity behavioral modification therapy today.  ASSESS: Grasiela has the diagnosis of obesity and her BMI today is 45.21 Marlet is in the action stage of change   ADVISE: Dafne was educated on the multiple health risks of obesity as well as the benefit of weight loss to improve her health. She was advised of the need for  long term treatment and the importance of lifestyle modifications to improve her current health and to decrease her risk of future health problems.  AGREE: Multiple dietary modification options and treatment options were discussed and  Aiyonna agreed to follow the recommendations documented in the above note.  ARRANGE: Ellie was educated on the importance of frequent visits to treat obesity as outlined per CMS and USPSTF guidelines and agreed to schedule her next follow up appointment today.  Corey Skains, am acting as transcriptionist for Abby Potash, PA-C I, Abby Potash, PA-C have reviewed above note and agree with its content

## 2018-09-19 DIAGNOSIS — I82401 Acute embolism and thrombosis of unspecified deep veins of right lower extremity: Secondary | ICD-10-CM | POA: Diagnosis not present

## 2018-09-23 ENCOUNTER — Encounter: Payer: 59 | Admitting: Gastroenterology

## 2018-10-08 ENCOUNTER — Ambulatory Visit (INDEPENDENT_AMBULATORY_CARE_PROVIDER_SITE_OTHER): Payer: 59 | Admitting: Physician Assistant

## 2018-10-08 ENCOUNTER — Encounter (INDEPENDENT_AMBULATORY_CARE_PROVIDER_SITE_OTHER): Payer: Self-pay | Admitting: Physician Assistant

## 2018-10-08 VITALS — BP 128/79 | HR 62 | Temp 97.9°F | Ht 66.0 in | Wt 282.0 lb

## 2018-10-08 DIAGNOSIS — Z9189 Other specified personal risk factors, not elsewhere classified: Secondary | ICD-10-CM | POA: Diagnosis not present

## 2018-10-08 DIAGNOSIS — Z6841 Body Mass Index (BMI) 40.0 and over, adult: Secondary | ICD-10-CM | POA: Diagnosis not present

## 2018-10-08 DIAGNOSIS — E559 Vitamin D deficiency, unspecified: Secondary | ICD-10-CM | POA: Diagnosis not present

## 2018-10-08 DIAGNOSIS — R7303 Prediabetes: Secondary | ICD-10-CM | POA: Diagnosis not present

## 2018-10-08 MED ORDER — VITAMIN D (ERGOCALCIFEROL) 1.25 MG (50000 UNIT) PO CAPS: 50000.0000 [IU] | ORAL_CAPSULE | ORAL | 0 refills | Status: DC

## 2018-10-09 NOTE — Progress Notes (Signed)
Office: [phone]  /  Fax: [phone]   HPI:   Chief Complaint: OBESITY Analycia is here to discuss her progress with her obesity treatment plan. She is on the portion control plan making smarter food choices, such as increase vegetables and decrease simple carbohydrates and is following her eating plan approximately 50 % of the time. She states she is exercising 0 minutes 0 times per week. Leslie Wright reports that she portion controlled over the holidays. She states that she is ready to get back on a category plan.  Her weight is 282 lb (127.9 kg) today and has gained 2 lbs since her last visit. She has lost 5 lbs since starting treatment with [place].  Vitamin D deficiency Leslie Wright has a diagnosis of vitamin D deficiency. She is currently taking prescription Vit D and denies nausea, vomiting or muscle weakness.  Pre-Diabetes Leslie Wright has a diagnosis of prediabetes based on her elevated Hgb A1c and was informed this puts her at greater risk of developing diabetes. She is taking metformin currently and continues to work on diet and exercise to decrease risk of diabetes. She denies nausea, vomiting or diarrhea. She denies polyphagia or hypoglycemia.  At risk for osteopenia and osteoporosis Leslie Wright is at higher risk of osteopenia and osteoporosis due to vitamin D deficiency.   ASSESSMENT AND PLAN:  Vitamin D deficiency - Plan: Vitamin D, Ergocalciferol, (DRISDOL) 1.25 MG (50000 UT) CAPS capsule  Prediabetes  At risk for osteoporosis  Class 3 severe obesity with serious comorbidity and body mass index (BMI) of 45.0 to 49.9 in adult, unspecified obesity type ([place])  PLAN:  Vitamin D Deficiency Leslie Wright was informed that low vitamin D levels contributes to fatigue and are associated with obesity, breast, and colon cancer. She agrees to continue to take prescription Vit D 50,000 IU every week # 4 with no refills and will follow up for routine testing of vitamin D, at least 2-3 times per year. She was  informed of the risk of over-replacement of vitamin D and agrees to not increase her dose unless she discusses this with [place] first. [name] agrees to follow up with our clinic in 2 weeks.  Pre-Diabetes Leslie Wright will continue to work on weight loss, exercise, and decreasing simple carbohydrates in her diet to help decrease the risk of diabetes. We dicussed metformin including benefits and risks. She was informed that eating too many simple carbohydrates or too many calories at one sitting increases the likelihood of GI side effects. Leslie Wright agrees to continue taking metformin for now and a prescription was not written today. Leslie Wright agrees to follow up with our clinic in 2 weeks.  At risk for osteopenia and osteoporosis Leslie Wright was given extended  (15 minutes) osteoporosis prevention counseling today. Leslie Wright is at risk for osteopenia and osteoporosis due to her vitamin D deficiency. She was encouraged to take her vitamin D and follow her higher calcium diet and increase strengthening exercise to help strengthen her bones and decrease her risk of osteopenia and osteoporosis.  Obesity Leslie Wright is currently in the action stage of change. As such, her goal is to continue with weight loss efforts She has agreed to follow the Category 2 plan Leslie Wright has been instructed to work up to a goal of 150 minutes of combined cardio and strengthening exercise per week for weight loss and overall health benefits. We discussed the following Behavioral Modification Strategies today: work on meal planning and cooking strategies and planning for success Leslie Wright has agreed to follow up with our  clinic in 2 weeks. She was informed of the importance of frequent follow up visits to maximize her success with intensive lifestyle modifications for her multiple health conditions.  ALLERGIES: No Known Allergies  MEDICATIONS: Current Outpatient Medications on File Prior to Visit  Medication Sig Dispense Refill  . acetaminophen  (TYLENOL) 650 MG CR tablet Take 650 mg by mouth See admin instructions. TAKE 1 TABLET (650 MG) BY MOUTH SCHEDULED IN THE MORNING, MAY REPEAT DOSE IN THE EVENING IF NEEDED FOR PAIN.    Marland Kitchen atorvastatin (LIPITOR) 20 MG tablet Take 1 tablet (20 mg total) by mouth daily. 30 tablet 0  . Cyanocobalamin (B-12) 2500 MCG TABS Take 2,500 mcg by mouth daily.    . hydrochlorothiazide (MICROZIDE) 12.5 MG capsule Take 1 capsule (12.5 mg total) by mouth daily. 30 capsule 0  . metFORMIN (GLUCOPHAGE) 500 MG tablet Take 1 tablet (500 mg total) by mouth daily with breakfast. 30 tablet 0  . rivaroxaban (XARELTO) 20 MG TABS tablet Take 1 tablet (20 mg total) by mouth daily with supper. 30 tablet 2   No current facility-administered medications on file prior to visit.     PAST MEDICAL HISTORY: Past Medical History:  Diagnosis Date  . Acid reflux   . Anemia   . Arthritis    "maybe in my fingers" (03/26/2018)  . Family history of adverse reaction to anesthesia    "brother, Otila Kluver, and my mom always get sick" (03/26/2018)  . Gallbladder problem   . High blood pressure   . High cholesterol   . Hip pain    "left; before OR" (03/26/2018)  . History of kidney stones   . OSA (obstructive sleep apnea)    "can't tolerate the mask" (03/26/2018)  . Pre-diabetes   . Shortness of breath   . Swelling of lower extremity    "that's why they put me on fluid pill" (03/26/2018)    PAST SURGICAL HISTORY: Past Surgical History:  Procedure Laterality Date  . CARPAL TUNNEL RELEASE Right ~ 2006  . EYE SURGERY    . JOINT REPLACEMENT    . LAPAROSCOPIC CHOLECYSTECTOMY  12/2010  . LASIK Bilateral 2000  . TOTAL HIP ARTHROPLASTY Left 03/25/2018   Procedure: LEFT TOTAL HIP ARTHROPLASTY ANTERIOR APPROACH;  Surgeon: Mcarthur Rossetti, MD;  Location: [facility];  Service: Orthopedics;  Laterality: Left;    SOCIAL HISTORY: Social History   Tobacco Use  . Smoking status: Never Smoker  . Smokeless tobacco: Never Used  Substance Use  Topics  . Alcohol use: Not Currently  . Drug use: Never    FAMILY HISTORY: Family History  Problem Relation Age of Onset  . Diabetes Mother   . Thyroid disease Mother   . Liver disease Mother   . Obesity Mother   . Hypertension Father   . Hyperlipidemia Father   . Heart disease Father   . Stroke Father   . Obesity Father   . Colon polyps Father   . Prostate cancer Brother   . Colon cancer Neg Hx        No one in family with colon cancer.    ROS: Review of Systems  Constitutional: Negative for weight loss.  Gastrointestinal: Negative for diarrhea, nausea and vomiting.  Musculoskeletal:       Negative for muscle weakness  Endo/Heme/Allergies:       Negative for polyphagia Negative for hypoglycemia     PHYSICAL EXAM: Blood pressure 128/79, pulse 62, temperature 97.9 F (36.6 C), temperature source Oral, height  5\' 6"  (1.676 m), weight 282 lb (127.9 kg), SpO2 98 %. Body mass index is 45.52 kg/m. Physical Exam Vitals signs reviewed.  Constitutional:      Appearance: Normal appearance. She is obese.  Cardiovascular:     Rate and Rhythm: Normal rate.     Pulses: Normal pulses.  Pulmonary:     Effort: Pulmonary effort is normal.  Musculoskeletal: Normal range of motion.  Skin:    General: Skin is warm and dry.  Neurological:     Mental Status: She is alert and oriented to person, place, and time.  Psychiatric:        Mood and Affect: Mood normal.        Behavior: Behavior normal.     RECENT LABS AND TESTS: BMET    Component Value Date/Time   NA 140 07/02/2018 1159   K 4.3 07/02/2018 1159   CL 102 07/02/2018 1159   CO2 23 07/02/2018 1159   GLUCOSE 84 07/02/2018 1159   GLUCOSE 116 (H) 03/26/2018 0357   BUN 14 07/02/2018 1159   CREATININE 1.13 (H) 07/02/2018 1159   CALCIUM 9.3 07/02/2018 1159   GFRNONAA 56 (L) 07/02/2018 1159   GFRAA 64 07/02/2018 1159   Lab Results  Component Value Date   HGBA1C 5.7 (H) 07/02/2018   HGBA1C 5.4 03/13/2018   HGBA1C  5.6 11/06/2017   HGBA1C 5.6 07/15/2017   HGBA1C 5.5 04/11/2017   Lab Results  Component Value Date   INSULIN 27.9 (H) 07/02/2018   INSULIN 20.3 11/06/2017   INSULIN 22.2 07/15/2017   INSULIN 20.4 04/11/2017   INSULIN 16.9 12/25/2016   CBC    Component Value Date/Time   WBC 9.6 03/26/2018 0357   RBC 3.91 03/26/2018 0357   HGB 10.9 (L) 03/26/2018 0357   HGB 13.4 09/27/2016 1342   HCT 34.2 (L) 03/26/2018 0357   HCT 40.9 09/27/2016 1342   PLT 199 03/26/2018 0357   MCV 87.5 03/26/2018 0357   MCV 83 09/27/2016 1342   MCH 27.9 03/26/2018 0357   MCHC 31.9 03/26/2018 0357   RDW 14.3 03/26/2018 0357   RDW 15.9 (H) 09/27/2016 1342   LYMPHSABS 2.3 09/27/2016 1342   MONOABS 1.1 (H) 11/10/2010 0554   EOSABS 0.6 (H) 09/27/2016 1342   BASOSABS 0.0 09/27/2016 1342   Iron/TIBC/Ferritin/ %Sat No results found for: IRON, TIBC, FERRITIN, IRONPCTSAT Lipid Panel     Component Value Date/Time   CHOL 170 05/19/2018   CHOL 163 11/06/2017 0850   TRIG 119 05/19/2018   HDL 45 05/19/2018   HDL 36 (L) 11/06/2017 0850   LDLCALC 101 05/19/2018   LDLCALC 101 (H) 11/06/2017 0850   Hepatic Function Panel     Component Value Date/Time   PROT 6.9 07/02/2018 1159   ALBUMIN 4.2 07/02/2018 1159   AST 16 07/02/2018 1159   ALT 14 07/02/2018 1159   ALKPHOS 84 07/02/2018 1159   BILITOT <0.2 07/02/2018 1159   BILIDIR 0.1 11/09/2010 1645   IBILI 0.4 11/09/2010 1645      Component Value Date/Time   TSH 1.890 09/27/2016 1342     Ref. Range 07/02/2018 11:59  Vitamin D, 25-Hydroxy Latest Ref Range: 30.0 - 100.0 ng/mL 30.3      OBESITY BEHAVIORAL INTERVENTION VISIT  Today's visit was #42   Starting weight: 287 lbs Starting date: 09/27/2016 Today's weight :282 lbs Today's date: 10/08/2018 Total lbs lost to date: 5  ASK: We discussed the diagnosis of obesity with Ladona Ridgel today and Rosaria Ferries agreed  to give [place] permission to discuss obesity behavioral modification therapy  today.  ASSESS: Hailey has the diagnosis of obesity and her BMI today is 45.54 Melvena is in the action stage of change   ADVISE: Maudie was educated on the multiple health risks of obesity as well as the benefit of weight loss to improve her health. She was advised of the need for long term treatment and the importance of lifestyle modifications to improve her current health and to decrease her risk of future health problems.  AGREE: Multiple dietary modification options and treatment options were discussed and  Madalee agreed to follow the recommendations documented in the above note.  ARRANGE: Alvie was educated on the importance of frequent visits to treat obesity as outlined per CMS and USPSTF guidelines and agreed to schedule her next follow up appointment today.  I, Tammy Wysor, am acting as Location manager for [employer], PA-C I, Abby Potash, PA-C have reviewed above note and agree with its content

## 2018-10-17 DIAGNOSIS — R600 Localized edema: Secondary | ICD-10-CM | POA: Diagnosis not present

## 2018-10-17 DIAGNOSIS — I82501 Chronic embolism and thrombosis of unspecified deep veins of right lower extremity: Secondary | ICD-10-CM | POA: Diagnosis not present

## 2018-10-17 DIAGNOSIS — R5383 Other fatigue: Secondary | ICD-10-CM | POA: Diagnosis not present

## 2018-10-17 DIAGNOSIS — Z6841 Body Mass Index (BMI) 40.0 and over, adult: Secondary | ICD-10-CM | POA: Diagnosis not present

## 2018-10-22 ENCOUNTER — Ambulatory Visit (INDEPENDENT_AMBULATORY_CARE_PROVIDER_SITE_OTHER): Payer: 59 | Admitting: Physician Assistant

## 2018-10-27 ENCOUNTER — Encounter (INDEPENDENT_AMBULATORY_CARE_PROVIDER_SITE_OTHER): Payer: Self-pay | Admitting: Physician Assistant

## 2018-10-27 ENCOUNTER — Ambulatory Visit (INDEPENDENT_AMBULATORY_CARE_PROVIDER_SITE_OTHER): Payer: 59 | Admitting: Physician Assistant

## 2018-10-27 VITALS — BP 120/79 | HR 67 | Temp 97.9°F | Ht 66.0 in | Wt 281.0 lb

## 2018-10-27 DIAGNOSIS — E7849 Other hyperlipidemia: Secondary | ICD-10-CM

## 2018-10-27 DIAGNOSIS — R7303 Prediabetes: Secondary | ICD-10-CM

## 2018-10-27 DIAGNOSIS — Z9189 Other specified personal risk factors, not elsewhere classified: Secondary | ICD-10-CM | POA: Diagnosis not present

## 2018-10-27 DIAGNOSIS — Z6841 Body Mass Index (BMI) 40.0 and over, adult: Secondary | ICD-10-CM

## 2018-10-27 MED ORDER — METFORMIN HCL 500 MG PO TABS: 500.0000 mg | ORAL_TABLET | Freq: Every day | ORAL | 0 refills | Status: DC

## 2018-10-27 MED ORDER — ATORVASTATIN CALCIUM 20 MG PO TABS: 20.0000 mg | ORAL_TABLET | Freq: Every day | ORAL | 0 refills | Status: DC

## 2018-10-27 MED FILL — VIT D2 1.25 MG (50,000 UNIT: 1.25 MG | 28 days supply | Qty: 4 | Fill #0

## 2018-10-27 MED FILL — HYDROCHLOROTHIAZIDE 12.5 MG: 12.5 | 30 days supply | Qty: 60 | Fill #0

## 2018-10-27 NOTE — Progress Notes (Signed)
Office: [phone]  /  Fax: [phone]   HPI:   Chief Complaint: OBESITY Leslie Wright is here to discuss her progress with her obesity treatment plan. She is on the Category 2 plan and is following her eating plan approximately 20 % of the time. She states she is exercising 0 minutes 0 times per week. Leslie Wright did well with weight loss. She reports that she continues to struggle to stay on the plan. She has an appointment with her cardiologist next week. Her weight is 281 lb (127.5 kg) today and has had a weight loss of 1 pounds over a period of 3 weeks since her last visit. She has lost 6 lbs since starting treatment with [place].  Pre-Diabetes Leslie Wright has a diagnosis of prediabetes  based on her elevated Hgb A1c and was informed this puts her at greater risk of developing diabetes. She is taking metformin currently and continues to work on diet and exercise to decrease risk of diabetes. She denies nausea, vomiting or diarrhea on metformin. She denies polyphagia or hypoglycemia.   Hyperlipidemia Leslie Wright has hyperlipidemia and has been trying to improve her cholesterol levels with intensive lifestyle modification including a low saturated fat diet, exercise and weight loss. She denies any chest pain. She is currently taking Atorvastatin.  Cardiovascular risk counseling Leslie Wright was given extended (15 minutes) coronary artery disease prevention counseling today. She is 53 y.o. female and has risk factors for heart disease including obesity. We discussed intensive lifestyle modifications today with an emphasis on specific weight loss instructions and strategies. Pt was also informed of the importance of increasing exercise and decreasing saturated fats to help prevent heart disease.  ASSESSMENT AND PLAN:  Prediabetes - Plan: metFORMIN (GLUCOPHAGE) 500 MG tablet  Other hyperlipidemia - Plan: atorvastatin (LIPITOR) 20 MG tablet  At risk for heart disease  Class 3 severe obesity with serious  comorbidity and body mass index (BMI) of 45.0 to 49.9 in adult, unspecified obesity type ([place])  PLAN:  Pre-Diabetes Leslie Wright will continue to work on weight loss, exercise, and decreasing simple carbohydrates in her diet to help decrease the risk of diabetes. We dicussed metformin including benefits and risks. She was informed that eating too many simple carbohydrates or too many calories at one sitting increases the likelihood of GI side effects. Nari agrees to continue taking metformin 500 mg daily with breakfast #30 with no refills for now and a prescription was written today. Leslie Wright agreed to follow up with our clinic in 2 weeks.  Hyperlipidemia Leslie Wright was informed of the American Heart Association Guidelines emphasizing intensive lifestyle modifications as the first line treatment for hyperlipidemia. We discussed many lifestyle modifications today in depth, and Leslie Wright will continue to work on decreasing saturated fats such as fatty red meat, butter and many fried foods. She will also increase vegetables and lean protein in her diet and continue to work on exercise and weight loss efforts. Leslie Wright agrees to continue taking Atorvastatin 20 mg qd with no refills and follow up with our clinic in 2 weeks.   Cardiovascular risk counseling Leslie Wright was given extended (15 minutes) coronary artery disease prevention counseling today. She is 53 y.o. female and has risk factors for heart disease including obesity. We discussed intensive lifestyle modifications today with an emphasis on specific weight loss instructions and strategies. Pt was also informed of the importance of increasing exercise and decreasing saturated fats to help prevent heart disease.  Obesity Leslie Wright is currently in the action stage of change. As such, her  goal is to continue with weight loss efforts She has agreed to follow the Category 2 plan Leslie Wright has been instructed to work up to a goal of 150 minutes of combined cardio and  strengthening exercise per week for weight loss and overall health benefits. We discussed the following Behavioral Modification Strategies today: increasing lean protein intake and work on meal planning and easy cooking plans  Leslie Wright has agreed to follow up with our clinic in 2 weeks. She was informed of the importance of frequent follow up visits to maximize her success with intensive lifestyle modifications for her multiple health conditions.  ALLERGIES: No Known Allergies  MEDICATIONS: Current Outpatient Medications on File Prior to Visit  Medication Sig Dispense Refill  . acetaminophen (TYLENOL) 650 MG CR tablet Take 650 mg by mouth See admin instructions. TAKE 1 TABLET (650 MG) BY MOUTH SCHEDULED IN THE MORNING, MAY REPEAT DOSE IN THE EVENING IF NEEDED FOR PAIN.    Marland Kitchen atorvastatin (LIPITOR) 20 MG tablet Take 1 tablet (20 mg total) by mouth daily. 30 tablet 0  . Cyanocobalamin (B-12) 2500 MCG TABS Take 2,500 mcg by mouth daily.    . hydrochlorothiazide (MICROZIDE) 12.5 MG capsule Take 1 capsule (12.5 mg total) by mouth daily. 30 capsule 0  . metFORMIN (GLUCOPHAGE) 500 MG tablet Take 1 tablet (500 mg total) by mouth daily with breakfast. 30 tablet 0  . rivaroxaban (XARELTO) 20 MG TABS tablet Take 1 tablet (20 mg total) by mouth daily with supper. 30 tablet 2  . Vitamin D, Ergocalciferol, (DRISDOL) 1.25 MG (50000 UT) CAPS capsule Take 1 capsule (50,000 Units total) by mouth every 7 (seven) days. 4 capsule 0   No current facility-administered medications on file prior to visit.     PAST MEDICAL HISTORY: Past Medical History:  Diagnosis Date  . Acid reflux   . Anemia   . Arthritis    "maybe in my fingers" (03/26/2018)  . Family history of adverse reaction to anesthesia    "brother, Otila Kluver, and my mom always get sick" (03/26/2018)  . Gallbladder problem   . High blood pressure   . High cholesterol   . Hip pain    "left; before OR" (03/26/2018)  . History of kidney stones   . OSA  (obstructive sleep apnea)    "can't tolerate the mask" (03/26/2018)  . Pre-diabetes   . Shortness of breath   . Swelling of lower extremity    "that's why they put me on fluid pill" (03/26/2018)    PAST SURGICAL HISTORY: Past Surgical History:  Procedure Laterality Date  . CARPAL TUNNEL RELEASE Right ~ 2006  . EYE SURGERY    . JOINT REPLACEMENT    . LAPAROSCOPIC CHOLECYSTECTOMY  12/2010  . LASIK Bilateral 2000  . TOTAL HIP ARTHROPLASTY Left 03/25/2018   Procedure: LEFT TOTAL HIP ARTHROPLASTY ANTERIOR APPROACH;  Surgeon: Mcarthur Rossetti, MD;  Location: [facility];  Service: Orthopedics;  Laterality: Left;    SOCIAL HISTORY: Social History   Tobacco Use  . Smoking status: Never Smoker  . Smokeless tobacco: Never Used  Substance Use Topics  . Alcohol use: Not Currently  . Drug use: Never    FAMILY HISTORY: Family History  Problem Relation Age of Onset  . Diabetes Mother   . Thyroid disease Mother   . Liver disease Mother   . Obesity Mother   . Hypertension Father   . Hyperlipidemia Father   . Heart disease Father   . Stroke Father   .  Obesity Father   . Colon polyps Father   . Prostate cancer Brother   . Colon cancer Neg Hx        No one in family with colon cancer.    ROS: Review of Systems  Constitutional: Positive for weight loss.  Cardiovascular: Negative for chest pain.  Genitourinary:       Negative for polyuria  Endo/Heme/Allergies:       Negative for hypoglycemia Negative for polyphagia    PHYSICAL EXAM: Blood pressure 120/79, pulse 67, temperature 97.9 F (36.6 C), temperature source Oral, height 5\' 6"  (1.676 m), weight 281 lb (127.5 kg), SpO2 98 %. Body mass index is 45.35 kg/m. Physical Exam Vitals signs reviewed.  Constitutional:      Appearance: Normal appearance. She is obese.  Cardiovascular:     Rate and Rhythm: Normal rate.     Pulses: Normal pulses.  Pulmonary:     Effort: Pulmonary effort is normal.  Musculoskeletal: Normal  range of motion.  Skin:    General: Skin is warm and dry.  Neurological:     Mental Status: She is alert and oriented to person, place, and time.  Psychiatric:        Mood and Affect: Mood normal.        Behavior: Behavior normal.     RECENT LABS AND TESTS: BMET    Component Value Date/Time   NA 140 07/02/2018 1159   K 4.3 07/02/2018 1159   CL 102 07/02/2018 1159   CO2 23 07/02/2018 1159   GLUCOSE 84 07/02/2018 1159   GLUCOSE 116 (H) 03/26/2018 0357   BUN 14 07/02/2018 1159   CREATININE 1.13 (H) 07/02/2018 1159   CALCIUM 9.3 07/02/2018 1159   GFRNONAA 56 (L) 07/02/2018 1159   GFRAA 64 07/02/2018 1159   Lab Results  Component Value Date   HGBA1C 5.7 (H) 07/02/2018   HGBA1C 5.4 03/13/2018   HGBA1C 5.6 11/06/2017   HGBA1C 5.6 07/15/2017   HGBA1C 5.5 04/11/2017   Lab Results  Component Value Date   INSULIN 27.9 (H) 07/02/2018   INSULIN 20.3 11/06/2017   INSULIN 22.2 07/15/2017   INSULIN 20.4 04/11/2017   INSULIN 16.9 12/25/2016   CBC    Component Value Date/Time   WBC 9.6 03/26/2018 0357   RBC 3.91 03/26/2018 0357   HGB 10.9 (L) 03/26/2018 0357   HGB 13.4 09/27/2016 1342   HCT 34.2 (L) 03/26/2018 0357   HCT 40.9 09/27/2016 1342   PLT 199 03/26/2018 0357   MCV 87.5 03/26/2018 0357   MCV 83 09/27/2016 1342   MCH 27.9 03/26/2018 0357   MCHC 31.9 03/26/2018 0357   RDW 14.3 03/26/2018 0357   RDW 15.9 (H) 09/27/2016 1342   LYMPHSABS 2.3 09/27/2016 1342   MONOABS 1.1 (H) 11/10/2010 0554   EOSABS 0.6 (H) 09/27/2016 1342   BASOSABS 0.0 09/27/2016 1342   Iron/TIBC/Ferritin/ %Sat No results found for: IRON, TIBC, FERRITIN, IRONPCTSAT Lipid Panel     Component Value Date/Time   CHOL 170 05/19/2018   CHOL 163 11/06/2017 0850   TRIG 119 05/19/2018   HDL 45 05/19/2018   HDL 36 (L) 11/06/2017 0850   LDLCALC 101 05/19/2018   LDLCALC 101 (H) 11/06/2017 0850   Hepatic Function Panel     Component Value Date/Time   PROT 6.9 07/02/2018 1159   ALBUMIN 4.2  07/02/2018 1159   AST 16 07/02/2018 1159   ALT 14 07/02/2018 1159   ALKPHOS 84 07/02/2018 1159   BILITOT <0.2 07/02/2018  1159   BILIDIR 0.1 11/09/2010 1645   IBILI 0.4 11/09/2010 1645      Component Value Date/Time   TSH 1.890 09/27/2016 1342      OBESITY BEHAVIORAL INTERVENTION VISIT  Today's visit was # 43  Starting weight: 287 lbs Starting date: 09/27/2016 Today's weight :: 281 lbs Today's date: 10/27/2018 Total lbs lost to date: 6   ASK: We discussed the diagnosis of obesity with Leslie Wright today and Leslie Wright agreed to give [place] permission to discuss obesity behavioral modification therapy today.  ASSESS: Leslie Wright has the diagnosis of obesity and her BMI today is 45.38 Leslie Wright is in the action stage of change   ADVISE: Leslie Wright was educated on the multiple health risks of obesity as well as the benefit of weight loss to improve her health. She was advised of the need for long term treatment and the importance of lifestyle modifications to improve her current health and to decrease her risk of future health problems.  AGREE: Multiple dietary modification options and treatment options were discussed and  Leslie Wright agreed to follow the recommendations documented in the above note.  ARRANGE: Leslie Wright was educated on the importance of frequent visits to treat obesity as outlined per CMS and USPSTF guidelines and agreed to schedule her next follow up appointment today.  I, Tammy Wysor, am acting as Location manager for [employer] I, Abby Potash, PA-C have reviewed above note and agree with its content

## 2018-10-28 MED FILL — ATORVASTATIN CALCIUM 20 MG: 20 | 30 days supply | Qty: 30 | Fill #0

## 2018-10-28 MED FILL — metFORMIN HCL 500 MG TABS: 500 | 30 days supply | Qty: 30 | Fill #0

## 2018-11-11 ENCOUNTER — Encounter (INDEPENDENT_AMBULATORY_CARE_PROVIDER_SITE_OTHER): Payer: Self-pay | Admitting: Physician Assistant

## 2018-11-11 ENCOUNTER — Ambulatory Visit (INDEPENDENT_AMBULATORY_CARE_PROVIDER_SITE_OTHER): Payer: 59 | Admitting: Physician Assistant

## 2018-11-11 VITALS — BP 122/72 | HR 94 | Temp 98.0°F | Ht 66.0 in | Wt 278.0 lb

## 2018-11-11 DIAGNOSIS — E559 Vitamin D deficiency, unspecified: Secondary | ICD-10-CM | POA: Diagnosis not present

## 2018-11-11 DIAGNOSIS — Z6841 Body Mass Index (BMI) 40.0 and over, adult: Secondary | ICD-10-CM

## 2018-11-11 DIAGNOSIS — Z9189 Other specified personal risk factors, not elsewhere classified: Secondary | ICD-10-CM | POA: Diagnosis not present

## 2018-11-11 MED ORDER — VITAMIN D (ERGOCALCIFEROL) 1.25 MG (50000 UNIT) PO CAPS: 50000.0000 [IU] | ORAL_CAPSULE | ORAL | 0 refills | Status: DC

## 2018-11-11 NOTE — Progress Notes (Signed)
Office: [phone]  /  Fax: [phone]   HPI:   Chief Complaint: OBESITY Leslie Wright is here to discuss her progress with her obesity treatment plan. She is on the Category 2 plan and is following her eating plan approximately 50% of the time. She states she is exercising 0 minutes 0 times per week. [name] did well with weight loss. She reports that she has been making better choices when eating out and has increased her protein.  Her weight is 278 lb (126.1 kg) today and has had a weight loss of 3 pounds over a period of 2 weeks since her last visit. She has lost 9 lbs since starting treatment with [place].  Vitamin D deficiency Leslie Wright has a diagnosis of Vitamin D deficiency. She is currently taking prescription Vit D and denies nausea, vomiting or muscle weakness.  Pre-Diabetes Leslie Wright has a diagnosis of prediabetes based on her elevated HgA1c and was informed this puts her at greater risk of developing diabetes. She is taking metformin currently and continues to work on diet and exercise to decrease risk of diabetes. She denies nausea, vomiting, diarrhea, or hypoglycemia. No polydipsia.  At risk for diabetes Leslie Wright is at higher than averagerisk for developing diabetes due to her obesity. She currently denies polyuria or polydipsia.  ASSESSMENT AND PLAN:  Vitamin D deficiency - Plan: Vitamin D, Ergocalciferol, (DRISDOL) 1.25 MG (50000 UT) CAPS capsule  At risk for diabetes mellitus  Class 3 severe obesity with serious comorbidity and body mass index (BMI) of 40.0 to 44.9 in adult, unspecified obesity type ([place])  PLAN:  Vitamin D Deficiency Leslie Wright was informed that low Vitamin D levels contributes to fatigue and are associated with obesity, breast, and colon cancer. She agrees to continue to take prescription Vit D @ 50,000 IU every week #4 with no refills and will follow-up for routine testing of Vitamin D, at least 2-3 times per year. She was informed of the risk of over-replacement of  Vitamin D and agrees to not increase her dose unless she discusses this with [place] first. [name] agrees to follow-up with our clinic in 3 weeks.  Pre-Diabetes Leslie Wright will continue to work on weight loss, exercise, and decreasing simple carbohydrates in her diet to help decrease the risk of diabetes. We dicussed metformin including benefits and risks. She was informed that eating too many simple carbohydrates or too many calories at one sitting increases the likelihood of GI side effects. Leslie Wright is on metformin for now and a prescription was not written today. Leslie Wright agrees to follow-up with our clinic in 3 weeks.  Diabetes risk counseling Leslie Wright was given extended (15 minutes) diabetes prevention counseling today. She is 53 y.o. female and has risk factors for diabetes including obesity. We discussed intensive lifestyle modifications today with an emphasis on weight loss as well as increasing exercise and decreasing simple carbohydrates in her diet.  Obesity Leslie Wright is currently in the action stage of change. As such, her goal is to continue with weight loss efforts. She has agreed to follow the Category 2 plan. Leslie Wright has been instructed to work up to a goal of 150 minutes of combined cardio and strengthening exercise per week for weight loss and overall health benefits. We discussed the following Behavioral Modification Stratagies today: increasing lean protein intake and work on meal planning and easy cooking plans.  Leslie Wright has agreed to follow up with our clinic in 3 weeks. She was informed of the importance of frequent follow up visits to maximize her  success with intensive lifestyle modifications for her multiple health conditions.  ALLERGIES: No Known Allergies  MEDICATIONS: Current Outpatient Medications on File Prior to Visit  Medication Sig Dispense Refill  . acetaminophen (TYLENOL) 650 MG CR tablet Take 650 mg by mouth See admin instructions. TAKE 1 TABLET (650 MG) BY MOUTH SCHEDULED IN  THE MORNING, MAY REPEAT DOSE IN THE EVENING IF NEEDED FOR PAIN.    Marland Kitchen atorvastatin (LIPITOR) 20 MG tablet Take 1 tablet (20 mg total) by mouth daily. 30 tablet 0  . Cyanocobalamin (B-12) 2500 MCG TABS Take 2,500 mcg by mouth daily.    . hydrochlorothiazide (MICROZIDE) 12.5 MG capsule Take 1 capsule (12.5 mg total) by mouth daily. 30 capsule 0  . metFORMIN (GLUCOPHAGE) 500 MG tablet Take 1 tablet (500 mg total) by mouth daily with breakfast. 30 tablet 0  . rivaroxaban (XARELTO) 20 MG TABS tablet Take 1 tablet (20 mg total) by mouth daily with supper. 30 tablet 2   No current facility-administered medications on file prior to visit.     PAST MEDICAL HISTORY: Past Medical History:  Diagnosis Date  . Acid reflux   . Anemia   . Arthritis    "maybe in my fingers" (03/26/2018)  . Family history of adverse reaction to anesthesia    "brother, Otila Kluver, and my mom always get sick" (03/26/2018)  . Gallbladder problem   . High blood pressure   . High cholesterol   . Hip pain    "left; before OR" (03/26/2018)  . History of kidney stones   . OSA (obstructive sleep apnea)    "can't tolerate the mask" (03/26/2018)  . Pre-diabetes   . Shortness of breath   . Swelling of lower extremity    "that's why they put me on fluid pill" (03/26/2018)    PAST SURGICAL HISTORY: Past Surgical History:  Procedure Laterality Date  . CARPAL TUNNEL RELEASE Right ~ 2006  . EYE SURGERY    . JOINT REPLACEMENT    . LAPAROSCOPIC CHOLECYSTECTOMY  12/2010  . LASIK Bilateral 2000  . TOTAL HIP ARTHROPLASTY Left 03/25/2018   Procedure: LEFT TOTAL HIP ARTHROPLASTY ANTERIOR APPROACH;  Surgeon: Mcarthur Rossetti, MD;  Location: [facility];  Service: Orthopedics;  Laterality: Left;    SOCIAL HISTORY: Social History   Tobacco Use  . Smoking status: Never Smoker  . Smokeless tobacco: Never Used  Substance Use Topics  . Alcohol use: Not Currently  . Drug use: Never    FAMILY HISTORY: Family History  Problem Relation  Age of Onset  . Diabetes Mother   . Thyroid disease Mother   . Liver disease Mother   . Obesity Mother   . Hypertension Father   . Hyperlipidemia Father   . Heart disease Father   . Stroke Father   . Obesity Father   . Colon polyps Father   . Prostate cancer Brother   . Colon cancer Neg Hx        No one in family with colon cancer.   ROS: Review of Systems  Constitutional: Positive for weight loss.  Gastrointestinal: Negative for diarrhea, nausea and vomiting.  Musculoskeletal:       Negative for muscle weakness.  Endo/Heme/Allergies: Negative for polydipsia.       Negative for hypoglycemia.   PHYSICAL EXAM: Blood pressure 122/72, pulse 94, temperature 98 F (36.7 C), temperature source Oral, height 5\' 6"  (1.676 m), weight 278 lb (126.1 kg), SpO2 96 %. Body mass index is 44.87 kg/m. Physical Exam Vitals  signs reviewed.  Constitutional:      Appearance: Normal appearance. She is obese.  Cardiovascular:     Rate and Rhythm: Normal rate.     Pulses: Normal pulses.  Pulmonary:     Effort: Pulmonary effort is normal.     Breath sounds: Normal breath sounds.  Musculoskeletal: Normal range of motion.  Skin:    General: Skin is warm and dry.  Neurological:     Mental Status: She is alert and oriented to person, place, and time.  Psychiatric:        Behavior: Behavior normal.   RECENT LABS AND TESTS: BMET    Component Value Date/Time   NA 140 07/02/2018 1159   K 4.3 07/02/2018 1159   CL 102 07/02/2018 1159   CO2 23 07/02/2018 1159   GLUCOSE 84 07/02/2018 1159   GLUCOSE 116 (H) 03/26/2018 0357   BUN 14 07/02/2018 1159   CREATININE 1.13 (H) 07/02/2018 1159   CALCIUM 9.3 07/02/2018 1159   GFRNONAA 56 (L) 07/02/2018 1159   GFRAA 64 07/02/2018 1159   Lab Results  Component Value Date   HGBA1C 5.7 (H) 07/02/2018   HGBA1C 5.4 03/13/2018   HGBA1C 5.6 11/06/2017   HGBA1C 5.6 07/15/2017   HGBA1C 5.5 04/11/2017   Lab Results  Component Value Date   INSULIN 27.9  (H) 07/02/2018   INSULIN 20.3 11/06/2017   INSULIN 22.2 07/15/2017   INSULIN 20.4 04/11/2017   INSULIN 16.9 12/25/2016   CBC    Component Value Date/Time   WBC 9.6 03/26/2018 0357   RBC 3.91 03/26/2018 0357   HGB 10.9 (L) 03/26/2018 0357   HGB 13.4 09/27/2016 1342   HCT 34.2 (L) 03/26/2018 0357   HCT 40.9 09/27/2016 1342   PLT 199 03/26/2018 0357   MCV 87.5 03/26/2018 0357   MCV 83 09/27/2016 1342   MCH 27.9 03/26/2018 0357   MCHC 31.9 03/26/2018 0357   RDW 14.3 03/26/2018 0357   RDW 15.9 (H) 09/27/2016 1342   LYMPHSABS 2.3 09/27/2016 1342   MONOABS 1.1 (H) 11/10/2010 0554   EOSABS 0.6 (H) 09/27/2016 1342   BASOSABS 0.0 09/27/2016 1342   Iron/TIBC/Ferritin/ %Sat No results found for: IRON, TIBC, FERRITIN, IRONPCTSAT Lipid Panel     Component Value Date/Time   CHOL 170 05/19/2018   CHOL 163 11/06/2017 0850   TRIG 119 05/19/2018   HDL 45 05/19/2018   HDL 36 (L) 11/06/2017 0850   LDLCALC 101 05/19/2018   LDLCALC 101 (H) 11/06/2017 0850   Hepatic Function Panel     Component Value Date/Time   PROT 6.9 07/02/2018 1159   ALBUMIN 4.2 07/02/2018 1159   AST 16 07/02/2018 1159   ALT 14 07/02/2018 1159   ALKPHOS 84 07/02/2018 1159   BILITOT <0.2 07/02/2018 1159   BILIDIR 0.1 11/09/2010 1645   IBILI 0.4 11/09/2010 1645      Component Value Date/Time   TSH 1.890 09/27/2016 1342   Results for MISHELLE, HASSAN "KAY" (MRN [id number]) as of 11/11/2018 14:48  Ref. Range 07/02/2018 11:59  Vitamin D, 25-Hydroxy Latest Ref Range: 30.0 - 100.0 ng/mL 30.3   OBESITY BEHAVIORAL INTERVENTION VISIT  Today's visit was #44  Starting weight: 287 lbs Starting date: 09/27/2016 Today's weight: 278 lbs Today's date: 11/11/2018 Total lbs lost to date: 9  ASK: We discussed the diagnosis of obesity with Leslie Wright today and Leslie Wright agreed to give [place] permission to discuss obesity behavioral modification therapy today.  ASSESS: Disney has the diagnosis of obesity  and her BMI today is  @ 44.87. Cleatus is in the action stage of change.   ADVISE: Yazmen was educated on the multiple health risks of obesity as well as the benefit of weight loss to improve her health. She was advised of the need for long term treatment and the importance of lifestyle modifications to improve her current health and to decrease her risk of future health problems.  AGREE: Multiple dietary modification options and treatment options were discussed and  Sheral agreed to follow the recommendations documented in the above note.  ARRANGE: Emerie was educated on the importance of frequent visits to treat obesity as outlined per CMS and USPSTF guidelines and agreed to schedule her next follow up appointment today.  Migdalia Dk, am acting as transcriptionist for Abby Potash, PA-C I, Abby Potash, PA-C have reviewed above note and agree with its content

## 2018-11-12 ENCOUNTER — Ambulatory Visit: Payer: 59 | Admitting: Cardiovascular Disease

## 2018-11-12 ENCOUNTER — Telehealth: Payer: Self-pay | Admitting: Cardiovascular Disease

## 2018-11-12 ENCOUNTER — Encounter: Payer: Self-pay | Admitting: Cardiovascular Disease

## 2018-11-12 VITALS — BP 113/75 | HR 82 | Ht 67.0 in | Wt 284.0 lb

## 2018-11-12 DIAGNOSIS — R9431 Abnormal electrocardiogram [ECG] [EKG]: Secondary | ICD-10-CM | POA: Diagnosis not present

## 2018-11-12 DIAGNOSIS — R0600 Dyspnea, unspecified: Secondary | ICD-10-CM

## 2018-11-12 DIAGNOSIS — R0609 Other forms of dyspnea: Secondary | ICD-10-CM

## 2018-11-12 DIAGNOSIS — I825Z1 Chronic embolism and thrombosis of unspecified deep veins of right distal lower extremity: Secondary | ICD-10-CM

## 2018-11-12 DIAGNOSIS — R600 Localized edema: Secondary | ICD-10-CM

## 2018-11-12 DIAGNOSIS — Z01812 Encounter for preprocedural laboratory examination: Secondary | ICD-10-CM

## 2018-11-12 MED ORDER — METOPROLOL TARTRATE 100 MG PO TABS: 100.0000 mg | ORAL_TABLET | Freq: Once | ORAL | 0 refills | Status: DC

## 2018-11-12 NOTE — Telephone Encounter (Signed)
°  Precert needed for: Echo - bilat leg edema   Location: [facility]    Date: December 03, 2018

## 2018-11-12 NOTE — Patient Instructions (Addendum)
Medication Instructions:  Continue all current medications.  Labwork:  BMET - order given today.   Do just prior to CT.  Testing/Procedures:  Coronary CTA done at [facility]. Your physician has requested that you have an echocardiogram. Echocardiography is a painless test that uses sound waves to create images of your heart. It provides your doctor with information about the size and shape of your heart and how well your heart's chambers and valves are working. This procedure takes approximately one hour. There are no restrictions for this procedure.  Office will contact with results via phone or letter.    Follow-Up: 3 months   Any Other Special Instructions Will Be Listed Below (If Applicable).  If you need a refill on your cardiac medications before your next appointment, please call your pharmacy. ==================================================  Please arrive at the [facility] main entrance of [facility] at xx:xx AM (30-45 minutes prior to test start time)  [facility] [place], [place] [address] [phone]  Proceed to the [facility] Radiology Department (First Floor).  Please follow these instructions carefully (unless otherwise directed):  Hold all erectile dysfunction medications at least 48 hours prior to test.  On the Night Before the Test: . Be sure to Drink plenty of water. . Do not consume any caffeinated/decaffeinated beverages or chocolate 12 hours prior to your test. . Do not take any antihistamines 12 hours prior to your test. . If you take Metformin do not take 24 hours prior to test. . If the patient has contrast allergy: ? Patient will need a prescription for Prednisone and very clear instructions (as follows): 1. Prednisone 50 mg - take 13 hours prior to test 2. Take another Prednisone 50 mg 7 hours prior to test 3. Take another Prednisone 50 mg 1 hour prior to test 4. Take Benadryl 50 mg 1 hour prior to  test . Patient must complete all four doses of above prophylactic medications. . Patient will need a ride after test due to Benadryl.  On the Day of the Test: . Drink plenty of water. Do not drink any water within one hour of the test. . Do not eat any food 4 hours prior to the test. . You may take your regular medications prior to the test.  . Take metoprolol (Lopressor) two hours prior to test. . HOLD Furosemide/Hydrochlorothiazide morning of the test.       After the Test: . Drink plenty of water. . After receiving IV contrast, you may experience a mild flushed feeling. This is normal. . On occasion, you may experience a mild rash up to 24 hours after the test. This is not dangerous. If this occurs, you can take Benadryl 25 mg and increase your fluid intake. . If you experience trouble breathing, this can be serious. If it is severe call 911 IMMEDIATELY. If it is mild, please call our office. . If you take any of these medications: Glipizide/Metformin, Avandament, Glucavance, please do not take 48 hours after completing test.

## 2018-11-12 NOTE — Progress Notes (Signed)
CARDIOLOGY CONSULT NOTE  Patient ID: Leslie Wright MRN: [id number] DOB/AGE: Aug 26, 1966 53 y.o.  Admit date: (Not on file) Primary Physician: Fanny Bien, MD Referring Physician: Fanny Bien, MD  Reason for Consultation: Exertional dyspnea and left leg swelling  HPI: Leslie Wright is a 53 y.o. female who is being seen today for the evaluation of exertional dyspnea and left leg swelling at the request of Fanny Bien, MD.   Past medical history includes obesity, prediabetes, DVT and hyperlipidemia.  I personally reviewed the ECG performed on 03/13/2018 which showed sinus rhythm with borderline LVH and late R wave transition.  She underwent left hip replacement surgery in the summer 2019.  She subsequently developed acute DVT in the right femoral, popliteal, posterior tibial, peroneal, and gastrocnemius veins demonstrated by Dopplers on 05/14/2018.  She underwent follow-up lower extremity Dopplers on 09/11/2018.  This showed no evidence of DVT in the left leg.  The right leg DVT had improved.  She denies exertional chest pain.  She has had progressive exertional dyspnea.  She becomes short of breath when carrying a 50 pound bag of dog food, doing housework, or carrying bales of hay in her barn.  Echocardiogram on 10/24/2016 demonstrated normal left ventricular systolic function and grade 1 diastolic dysfunction, LVEF 55 to 60%.  She denies palpitations, orthopnea, and paroxysmal nocturnal dyspnea.  She has had some episodic left neck pains.  She had been on Xarelto and now takes aspirin 81 mg daily as prescribed by her PCP.  She has been wearing TED hose which has helped lower extremity swelling.  Family history: Paternal uncle died of an MI at age 71.  He was a smoker.  His son died of an MI at age 23.  He abused alcohol.   No Known Allergies  Current Outpatient Medications  Medication Sig Dispense Refill  . acetaminophen (TYLENOL) 650 MG CR tablet  Take 650 mg by mouth See admin instructions. TAKE 1 TABLET (650 MG) BY MOUTH SCHEDULED IN THE MORNING, MAY REPEAT DOSE IN THE EVENING IF NEEDED FOR PAIN.    Marland Kitchen aspirin EC 81 MG tablet Take 81 mg by mouth daily.    Marland Kitchen atorvastatin (LIPITOR) 20 MG tablet Take 1 tablet (20 mg total) by mouth daily. 30 tablet 0  . Cyanocobalamin (B-12) 2500 MCG TABS Take 2,500 mcg by mouth daily.    . hydrochlorothiazide (MICROZIDE) 12.5 MG capsule Take 1 capsule (12.5 mg total) by mouth daily. 30 capsule 0  . metFORMIN (GLUCOPHAGE) 500 MG tablet Take 1 tablet (500 mg total) by mouth daily with breakfast. 30 tablet 0  . Vitamin D, Ergocalciferol, (DRISDOL) 1.25 MG (50000 UT) CAPS capsule Take 1 capsule (50,000 Units total) by mouth every 7 (seven) days. 4 capsule 0   No current facility-administered medications for this visit.     Past Medical History:  Diagnosis Date  . Acid reflux   . Anemia   . Arthritis    "maybe in my fingers" (03/26/2018)  . Family history of adverse reaction to anesthesia    "brother, Otila Kluver, and my mom always get sick" (03/26/2018)  . Gallbladder problem   . High blood pressure   . High cholesterol   . Hip pain    "left; before OR" (03/26/2018)  . History of kidney stones   . OSA (obstructive sleep apnea)    "can't tolerate the mask" (03/26/2018)  . Pre-diabetes   . Shortness of breath   .  Swelling of lower extremity    "that's why they put me on fluid pill" (03/26/2018)    Past Surgical History:  Procedure Laterality Date  . CARPAL TUNNEL RELEASE Right ~ 2006  . EYE SURGERY    . JOINT REPLACEMENT    . LAPAROSCOPIC CHOLECYSTECTOMY  12/2010  . LASIK Bilateral 2000  . TOTAL HIP ARTHROPLASTY Left 03/25/2018   Procedure: LEFT TOTAL HIP ARTHROPLASTY ANTERIOR APPROACH;  Surgeon: Mcarthur Rossetti, MD;  Location: [facility];  Service: Orthopedics;  Laterality: Left;    Social History   Socioeconomic History  . Marital status: Single    Spouse name: Not on file  . Number of  children: Not on file  . Years of education: Not on file  . Highest education level: Not on file  Occupational History  . Occupation: Sport and exercise psychologist: [employer]  . Financial resource strain: Not on file  . Food insecurity:    Worry: Not on file    Inability: Not on file  . Transportation needs:    Medical: Not on file    Non-medical: Not on file  Tobacco Use  . Smoking status: Never Smoker  . Smokeless tobacco: Never Used  Substance and Sexual Activity  . Alcohol use: Not Currently  . Drug use: Never  . Sexual activity: Not Currently  Lifestyle  . Physical activity:    Days per week: Not on file    Minutes per session: Not on file  . Stress: Not on file  Relationships  . Social connections:    Talks on phone: Not on file    Gets together: Not on file    Attends religious service: Not on file    Active member of club or organization: Not on file    Attends meetings of clubs or organizations: Not on file    Relationship status: Not on file  . Intimate partner violence:    Fear of current or ex partner: Not on file    Emotionally abused: Not on file    Physically abused: Not on file    Forced sexual activity: Not on file  Other Topics Concern  . Not on file  Social History Narrative  . Not on file      Current Meds  Medication Sig  . acetaminophen (TYLENOL) 650 MG CR tablet Take 650 mg by mouth See admin instructions. TAKE 1 TABLET (650 MG) BY MOUTH SCHEDULED IN THE MORNING, MAY REPEAT DOSE IN THE EVENING IF NEEDED FOR PAIN.  Marland Kitchen aspirin EC 81 MG tablet Take 81 mg by mouth daily.  Marland Kitchen atorvastatin (LIPITOR) 20 MG tablet Take 1 tablet (20 mg total) by mouth daily.  . Cyanocobalamin (B-12) 2500 MCG TABS Take 2,500 mcg by mouth daily.  . hydrochlorothiazide (MICROZIDE) 12.5 MG capsule Take 1 capsule (12.5 mg total) by mouth daily.  . metFORMIN (GLUCOPHAGE) 500 MG tablet Take 1 tablet (500 mg total) by mouth daily with breakfast.  . Vitamin D,  Ergocalciferol, (DRISDOL) 1.25 MG (50000 UT) CAPS capsule Take 1 capsule (50,000 Units total) by mouth every 7 (seven) days.      Review of systems complete and found to be negative unless listed above in HPI    Physical exam Blood pressure 113/75, pulse 82, height 5\' 7"  (1.702 m), weight 284 lb (128.8 kg), SpO2 96 %. General: NAD Neck: No JVD, no thyromegaly or thyroid nodule.  Lungs: Clear to auscultation bilaterally with normal respiratory effort. CV: Nondisplaced PMI.  Regular rate and rhythm, normal S1/S2, no S3/S4, no murmur.  No peripheral edema.  No carotid bruit.    Abdomen: Soft, nontender, obese.  Skin: Intact without lesions or rashes.  Neurologic: Alert and oriented x 3.  Psych: Normal affect. Extremities: No clubbing or cyanosis.  HEENT: Normal.   ECG: Most recent ECG reviewed.   Labs: Lab Results  Component Value Date/Time   K 4.3 07/02/2018 11:59 AM   BUN 14 07/02/2018 11:59 AM   CREATININE 1.13 (H) 07/02/2018 11:59 AM   ALT 14 07/02/2018 11:59 AM   TSH 1.890 09/27/2016 01:42 PM   HGB 10.9 (L) 03/26/2018 03:57 AM   HGB 13.4 09/27/2016 01:42 PM     Lipids: Lab Results  Component Value Date/Time   LDLCALC 101 05/19/2018   LDLCALC 101 (H) 11/06/2017 08:50 AM   CHOL 170 05/19/2018   CHOL 163 11/06/2017 08:50 AM   TRIG 119 05/19/2018   HDL 45 05/19/2018   HDL 36 (L) 11/06/2017 08:50 AM        ASSESSMENT AND PLAN:  1.  Dyspnea on exertion: This appears to have progressed over the last several months.  Echocardiogram in January 2018 was unremarkable.  In order to exclude coronary artery disease, I will proceed with coronary CT angiography.  I will also obtain a follow-up echocardiogram to evaluate for interval changes in cardiac structure and function.  She is a non-smoker and has no history of pulmonary disease.  2.  Left leg swelling: No DVT by Dopplers in December 2019 as detailed above.  She may have leg edema due to obesity and abdominal venous  compression.  She has been wearing TED hose which have helped.  I will obtain an echocardiogram to evaluate for interval changes in cardiac structure and function.  She had grade 1 diastolic dysfunction by echocardiogram in January 2018.  3.  Right leg DVT: Dopplers reviewed above.  She is on aspirin.  She had been on Xarelto.     Disposition: Follow up in 3 months  Signed: Kate Sable, M.D., F.A.C.C.  11/12/2018, 3:00 PM

## 2018-11-20 ENCOUNTER — Other Ambulatory Visit: Payer: Self-pay | Admitting: Cardiovascular Disease

## 2018-11-20 DIAGNOSIS — R600 Localized edema: Secondary | ICD-10-CM

## 2018-11-20 DIAGNOSIS — R0609 Other forms of dyspnea: Secondary | ICD-10-CM

## 2018-11-20 DIAGNOSIS — R0600 Dyspnea, unspecified: Secondary | ICD-10-CM

## 2018-12-02 ENCOUNTER — Ambulatory Visit (INDEPENDENT_AMBULATORY_CARE_PROVIDER_SITE_OTHER): Payer: 59 | Admitting: Physician Assistant

## 2018-12-02 VITALS — BP 111/76 | HR 74 | Temp 97.6°F | Ht 66.0 in | Wt 280.0 lb

## 2018-12-02 DIAGNOSIS — Z9189 Other specified personal risk factors, not elsewhere classified: Secondary | ICD-10-CM

## 2018-12-02 DIAGNOSIS — R7303 Prediabetes: Secondary | ICD-10-CM

## 2018-12-02 DIAGNOSIS — E559 Vitamin D deficiency, unspecified: Secondary | ICD-10-CM | POA: Diagnosis not present

## 2018-12-02 DIAGNOSIS — Z6841 Body Mass Index (BMI) 40.0 and over, adult: Secondary | ICD-10-CM | POA: Diagnosis not present

## 2018-12-02 DIAGNOSIS — E7849 Other hyperlipidemia: Secondary | ICD-10-CM | POA: Diagnosis not present

## 2018-12-02 DIAGNOSIS — E66813 Obesity, class 3: Secondary | ICD-10-CM

## 2018-12-02 MED ORDER — METFORMIN HCL 500 MG PO TABS: 500.0000 mg | ORAL_TABLET | Freq: Every day | ORAL | 0 refills | Status: DC

## 2018-12-02 MED ORDER — VITAMIN D (ERGOCALCIFEROL) 1.25 MG (50000 UNIT) PO CAPS: 50000.0000 [IU] | ORAL_CAPSULE | ORAL | 0 refills | Status: DC

## 2018-12-02 MED ORDER — ATORVASTATIN CALCIUM 20 MG PO TABS: 20.0000 mg | ORAL_TABLET | Freq: Every day | ORAL | 0 refills | Status: DC

## 2018-12-02 MED FILL — ATORVASTATIN 20 MG TABLET: 20 | 30 days supply | Qty: 30 | Fill #0

## 2018-12-02 MED FILL — metFORMIN HCL 500 MG TABS: 500 | 30 days supply | Qty: 30 | Fill #0

## 2018-12-02 MED FILL — VIT D2 1.25 MG (50,000 UNIT: 1.25 MG | 28 days supply | Qty: 4 | Fill #0

## 2018-12-02 NOTE — Progress Notes (Signed)
Office: [phone]  /  Fax: [phone]   HPI:   Chief Complaint: OBESITY Leslie Wright is here to discuss her progress with her obesity treatment plan. She is on the Category 2 plan and is following her eating plan approximately 50% of the time. She states she is exercising 0 minutes 0 times per week. Leslie Wright reports that she has been following the plan for breakfast and lunch but is struggling with dinner due to eating out so much. She has also been attending multiple celebrations. Her weight is 280 lb (127 kg) today and has had a weight gain of 2 lbs since her last visit. She has lost 7 lbs since starting treatment with [place].  Hyperlipidemia Leslie Wright has hyperlipidemia and has been trying to improve her cholesterol levels with intensive lifestyle modification including a low saturated fat diet, exercise and weight loss. She is on atorvastatin and denies any chest pain.  At risk for cardiovascular disease Leslie Wright is at a higher than average risk for cardiovascular disease due to obesity. She currently denies any chest pain.  Pre-Diabetes Leslie Wright has a diagnosis of prediabetes based on her elevated Hgb A1c and was informed this puts her at greater risk of developing diabetes. She is taking metformin currently and continues to work on diet and exercise to decrease risk of diabetes. She denies nausea, vomiting, diarrhea, or polyphagia.   Vitamin D deficiency Leslie Wright has a diagnosis of Vitamin D deficiency. She is currently taking prescription Vit D and denies nausea, vomiting or muscle weakness.  ASSESSMENT AND PLAN:  Other hyperlipidemia - Plan: atorvastatin (LIPITOR) 20 MG tablet  Prediabetes - Plan: Hemoglobin A1c, Insulin, random, Comprehensive metabolic panel, metFORMIN (GLUCOPHAGE) 500 MG tablet  Vitamin D deficiency - Plan: VITAMIN D 25 Hydroxy (Vit-D Deficiency, Fractures), Vitamin D, Ergocalciferol, (DRISDOL) 1.25 MG (50000 UT) CAPS capsule  At risk for heart disease  Class 3 severe  obesity with serious comorbidity and body mass index (BMI) of 45.0 to 49.9 in adult, unspecified obesity type (HCC)  PLAN:  Hyperlipidemia Leslie Wright was informed of the American Heart Association Guidelines emphasizing intensive lifestyle modifications as the first line treatment for hyperlipidemia. We discussed many lifestyle modifications today in depth, and Leslie Wright will continue to work on decreasing saturated fats such as fatty red meat, butter and many fried foods. Leslie Wright was given a refill on her atorvastatin #30 and agrees to have labs checked today. She will also increase vegetables and lean protein in her diet and continue to work on exercise and weight loss efforts.  Cardiovascular risk counseling [name] was given extended (15 minutes) coronary artery disease prevention counseling today. She is 53 y.o. female and has risk factors for heart disease including obesity. We discussed intensive lifestyle modifications today with an emphasis on specific weight loss instructions and strategies. Pt was also informed of the importance of increasing exercise and decreasing saturated fats to help prevent heart disease.  Pre-Diabetes Leslie Wright will continue to work on weight loss, exercise, and decreasing simple carbohydrates in her diet to help decrease the risk of diabetes. We dicussed metformin including benefits and risks. She was informed that eating too many simple carbohydrates or too many calories at one sitting increases the likelihood of GI side effects. Leslie Wright is on metformin for now and a refill prescription was written today for #30 with 0 refills. Leslie Wright agrees to follow-up with our clinic in 3 weeks.   Vitamin D Deficiency Leslie Wright was informed that low Vitamin D levels contributes to fatigue and are associated with  obesity, breast, and colon cancer. She agrees to continue to take prescription Vit D @ 50,000 IU every week #4 with 0 refills and will follow-up for routine testing of Vitamin D today.  She was informed of the risk of over-replacement of Vitamin D and agrees to not increase her dose unless she discusses this with [place] first. Leslie Wright agrees to follow-up with our clinic in 3 weeks.  Obesity Leslie Wright is currently in the action stage of change. As such, her goal is to continue with weight loss efforts. She has agreed to follow the Category 2 plan. Leslie Wright has been instructed to work up to a goal of 150 minutes of combined cardio and strengthening exercise per week for weight loss and overall health benefits. We discussed the following Behavioral Modification Strategies today: increasing lean protein intake, decrease eating out, and work on meal planning and easy cooking plans  Leslie Wright has agreed to follow-up with our clinic in 3 weeks. She was informed of the importance of frequent follow up visits to maximize her success with intensive lifestyle modifications for her multiple health conditions.  ALLERGIES: No Known Allergies  MEDICATIONS: Current Outpatient Medications on File Prior to Visit  Medication Sig Dispense Refill  . acetaminophen (TYLENOL) 650 MG CR tablet Take 650 mg by mouth See admin instructions. TAKE 1 TABLET (650 MG) BY MOUTH SCHEDULED IN THE MORNING, MAY REPEAT DOSE IN THE EVENING IF NEEDED FOR PAIN.    Marland Kitchen aspirin EC 81 MG tablet Take 81 mg by mouth daily.    . Cyanocobalamin (B-12) 2500 MCG TABS Take 2,500 mcg by mouth daily.    . hydrochlorothiazide (MICROZIDE) 12.5 MG capsule Take 1 capsule (12.5 mg total) by mouth daily. 30 capsule 0  . metoprolol tartrate (LOPRESSOR) 100 MG tablet Take 1 tablet (100 mg total) by mouth once for 1 dose. 1 tablet 0   No current facility-administered medications on file prior to visit.     PAST MEDICAL HISTORY: Past Medical History:  Diagnosis Date  . Acid reflux   . Anemia   . Arthritis    "maybe in my fingers" (03/26/2018)  . Family history of adverse reaction to anesthesia    "brother, Otila Kluver, and my mom always get sick"  (03/26/2018)  . Gallbladder problem   . High blood pressure   . High cholesterol   . Hip pain    "left; before OR" (03/26/2018)  . History of kidney stones   . OSA (obstructive sleep apnea)    "can't tolerate the mask" (03/26/2018)  . Pre-diabetes   . Shortness of breath   . Swelling of lower extremity    "that's why they put me on fluid pill" (03/26/2018)    PAST SURGICAL HISTORY: Past Surgical History:  Procedure Laterality Date  . CARPAL TUNNEL RELEASE Right ~ 2006  . EYE SURGERY    . JOINT REPLACEMENT    . LAPAROSCOPIC CHOLECYSTECTOMY  12/2010  . LASIK Bilateral 2000  . TOTAL HIP ARTHROPLASTY Left 03/25/2018   Procedure: LEFT TOTAL HIP ARTHROPLASTY ANTERIOR APPROACH;  Surgeon: Mcarthur Rossetti, MD;  Location: [facility];  Service: Orthopedics;  Laterality: Left;    SOCIAL HISTORY: Social History   Tobacco Use  . Smoking status: Never Smoker  . Smokeless tobacco: Never Used  Substance Use Topics  . Alcohol use: Not Currently  . Drug use: Never    FAMILY HISTORY: Family History  Problem Relation Age of Onset  . Diabetes Mother   . Thyroid disease Mother   .  Liver disease Mother   . Obesity Mother   . Hypertension Father   . Hyperlipidemia Father   . Heart disease Father   . Stroke Father   . Obesity Father   . Colon polyps Father   . Prostate cancer Brother   . Colon cancer Neg Hx        No one in family with colon cancer.   ROS: Review of Systems  Constitutional: Negative for weight loss.  Cardiovascular: Negative for chest pain.  Gastrointestinal: Negative for diarrhea, nausea and vomiting.  Musculoskeletal:       Negative for muscle weakness.  Endo/Heme/Allergies:       Negative for polyphagia. Negative for hypoglycemia.   PHYSICAL EXAM: Blood pressure 111/76, pulse 74, temperature 97.6 F (36.4 C), temperature source Oral, height 5\' 6"  (1.676 m), weight 280 lb (127 kg), SpO2 94 %. Body mass index is 45.19 kg/m. Physical Exam Vitals signs  reviewed.  Constitutional:      Appearance: Normal appearance. She is obese.  Cardiovascular:     Rate and Rhythm: Normal rate.     Pulses: Normal pulses.  Pulmonary:     Effort: Pulmonary effort is normal.     Breath sounds: Normal breath sounds.  Musculoskeletal: Normal range of motion.  Skin:    General: Skin is warm and dry.  Neurological:     Mental Status: She is alert and oriented to person, place, and time.  Psychiatric:        Behavior: Behavior normal.   RECENT LABS AND TESTS: BMET    Component Value Date/Time   NA 140 07/02/2018 1159   K 4.3 07/02/2018 1159   CL 102 07/02/2018 1159   CO2 23 07/02/2018 1159   GLUCOSE 84 07/02/2018 1159   GLUCOSE 116 (H) 03/26/2018 0357   BUN 14 07/02/2018 1159   CREATININE 1.13 (H) 07/02/2018 1159   CALCIUM 9.3 07/02/2018 1159   GFRNONAA 56 (L) 07/02/2018 1159   GFRAA 64 07/02/2018 1159   Lab Results  Component Value Date   HGBA1C 5.7 (H) 07/02/2018   HGBA1C 5.4 03/13/2018   HGBA1C 5.6 11/06/2017   HGBA1C 5.6 07/15/2017   HGBA1C 5.5 04/11/2017   Lab Results  Component Value Date   INSULIN 27.9 (H) 07/02/2018   INSULIN 20.3 11/06/2017   INSULIN 22.2 07/15/2017   INSULIN 20.4 04/11/2017   INSULIN 16.9 12/25/2016   CBC    Component Value Date/Time   WBC 9.6 03/26/2018 0357   RBC 3.91 03/26/2018 0357   HGB 10.9 (L) 03/26/2018 0357   HGB 13.4 09/27/2016 1342   HCT 34.2 (L) 03/26/2018 0357   HCT 40.9 09/27/2016 1342   PLT 199 03/26/2018 0357   MCV 87.5 03/26/2018 0357   MCV 83 09/27/2016 1342   MCH 27.9 03/26/2018 0357   MCHC 31.9 03/26/2018 0357   RDW 14.3 03/26/2018 0357   RDW 15.9 (H) 09/27/2016 1342   LYMPHSABS 2.3 09/27/2016 1342   MONOABS 1.1 (H) 11/10/2010 0554   EOSABS 0.6 (H) 09/27/2016 1342   BASOSABS 0.0 09/27/2016 1342   Iron/TIBC/Ferritin/ %Sat No results found for: IRON, TIBC, FERRITIN, IRONPCTSAT Lipid Panel     Component Value Date/Time   CHOL 170 05/19/2018   CHOL 163 11/06/2017 0850    TRIG 119 05/19/2018   HDL 45 05/19/2018   HDL 36 (L) 11/06/2017 0850   LDLCALC 101 05/19/2018   LDLCALC 101 (H) 11/06/2017 0850   Hepatic Function Panel     Component Value Date/Time  PROT 6.9 07/02/2018 1159   ALBUMIN 4.2 07/02/2018 1159   AST 16 07/02/2018 1159   ALT 14 07/02/2018 1159   ALKPHOS 84 07/02/2018 1159   BILITOT <0.2 07/02/2018 1159   BILIDIR 0.1 11/09/2010 1645   IBILI 0.4 11/09/2010 1645      Component Value Date/Time   TSH 1.890 09/27/2016 1342    Ref. Range 07/02/2018 11:59  Vitamin D, 25-Hydroxy Latest Ref Range: 30.0 - 100.0 ng/mL 30.3   OBESITY BEHAVIORAL INTERVENTION VISIT  Today's visit was #45  Starting weight: 287 lbs Starting date: 09/27/2016 Today's weight: 280 lbs Today's date: 12/02/2018 Total lbs lost to date: 7    12/02/2018  Height 5\' 6"  (1.676 m)  Weight 280 lb (127 kg)  BMI (Calculated) 45.21  BLOOD PRESSURE - SYSTOLIC 876  BLOOD PRESSURE - DIASTOLIC 76   Body Fat % 81.1 %  Total Body Water (lbs) 98.4 lbs   ASK: We discussed the diagnosis of obesity with Leslie Wright today and Leslie Wright agreed to give [place] permission to discuss obesity behavioral modification therapy today.  ASSESS: Leslie Wright has the diagnosis of obesity and her BMI today is 45.21. Leslie Wright is in the action stage of change.   ADVISE: Ara was educated on the multiple health risks of obesity as well as the benefit of weight loss to improve her health. She was advised of the need for long term treatment and the importance of lifestyle modifications to improve her current health and to decrease her risk of future health problems.  AGREE: Multiple dietary modification options and treatment options were discussed and  Leslie Wright agreed to follow the recommendations documented in the above note.  ARRANGE: Leslie Wright was educated on the importance of frequent visits to treat obesity as outlined per CMS and USPSTF guidelines and agreed to schedule her next follow up appointment  today.  Migdalia Dk, am acting as transcriptionist for Abby Potash, PA-C I, Abby Potash, PA-C have reviewed above note and agree with its content

## 2018-12-03 ENCOUNTER — Ambulatory Visit (INDEPENDENT_AMBULATORY_CARE_PROVIDER_SITE_OTHER): Payer: 59

## 2018-12-03 DIAGNOSIS — R0600 Dyspnea, unspecified: Secondary | ICD-10-CM

## 2018-12-03 DIAGNOSIS — R600 Localized edema: Secondary | ICD-10-CM

## 2018-12-03 DIAGNOSIS — R0609 Other forms of dyspnea: Secondary | ICD-10-CM

## 2018-12-03 LAB — COMPREHENSIVE METABOLIC PANEL
ALT: 17 IU/L (ref 0–32)
AST: 17 IU/L (ref 0–40)
Albumin/Globulin Ratio: 1.4 (ref 1.2–2.2)
Albumin: 4 g/dL (ref 3.8–4.9)
Alkaline Phosphatase: 98 IU/L (ref 39–117)
BUN/Creatinine Ratio: 16 (ref 9–23)
BUN: 17 mg/dL (ref 6–24)
Bilirubin Total: 0.2 mg/dL (ref 0.0–1.2)
CO2: 25 mmol/L (ref 20–29)
Calcium: 9.6 mg/dL (ref 8.7–10.2)
Chloride: 99 mmol/L (ref 96–106)
Creatinine, Ser: 1.05 mg/dL — ABNORMAL HIGH (ref 0.57–1.00)
GFR calc non Af Amer: 61 mL/min/{1.73_m2} (ref >59)
GFR, EST AFRICAN AMERICAN: 70 mL/min/{1.73_m2} (ref >59)
GLUCOSE: 102 mg/dL — AB (ref 65–99)
Globulin, Total: 2.9 g/dL (ref 1.5–4.5)
Potassium: 4.1 mmol/L (ref 3.5–5.2)
Sodium: 143 mmol/L (ref 134–144)
Total Protein: 6.9 g/dL (ref 6.0–8.5)

## 2018-12-03 LAB — HEMOGLOBIN A1C
Est. average glucose Bld gHb Est-mCnc: 123 mg/dL
Hgb A1c MFr Bld: 5.9 % — ABNORMAL HIGH (ref 4.8–5.6)

## 2018-12-03 LAB — VITAMIN D 25 HYDROXY (VIT D DEFICIENCY, FRACTURES): Vit D, 25-Hydroxy: 41.2 ng/mL (ref 30.0–100.0)

## 2018-12-03 LAB — INSULIN, RANDOM: INSULIN: 42.3 u[IU]/mL — ABNORMAL HIGH (ref 2.6–24.9)

## 2018-12-04 MED FILL — HYDROCHLOROTHIAZIDE 12.5 MG: 12.5 | 30 days supply | Qty: 60 | Fill #1 | Status: TO

## 2018-12-05 ENCOUNTER — Telehealth: Payer: Self-pay | Admitting: *Deleted

## 2018-12-05 NOTE — Telephone Encounter (Signed)
Notes recorded by Laurine Blazer, LPN on 01/08/8263 at 1:58 PM EST Patient notified. Copy to pmd. Follow up scheduled for 02/25/19 with Dr. Paralee Cancel office.   ------  Notes recorded by Herminio Commons, MD on 12/04/2018 at 11:47 AM EST Normal pumping function.

## 2018-12-15 ENCOUNTER — Other Ambulatory Visit: Payer: Self-pay

## 2018-12-22 ENCOUNTER — Ambulatory Visit (INDEPENDENT_AMBULATORY_CARE_PROVIDER_SITE_OTHER): Payer: 59 | Admitting: Physician Assistant

## 2018-12-25 ENCOUNTER — Ambulatory Visit (HOSPITAL_COMMUNITY): Payer: 59

## 2018-12-25 ENCOUNTER — Encounter (INDEPENDENT_AMBULATORY_CARE_PROVIDER_SITE_OTHER): Payer: Self-pay

## 2019-01-26 ENCOUNTER — Ambulatory Visit (HOSPITAL_COMMUNITY): Payer: 59

## 2019-01-30 MED FILL — HYDROCHLOROTHIAZIDE 12.5 MG: 12.5 | 30 days supply | Qty: 60 | Fill #0

## 2019-02-16 ENCOUNTER — Telehealth: Payer: Self-pay | Admitting: Cardiovascular Disease

## 2019-02-16 MED ORDER — METOPROLOL TARTRATE 100 MG PO TABS: 100.0000 mg | ORAL_TABLET | Freq: Once | ORAL | 0 refills | Status: DC

## 2019-02-16 NOTE — Telephone Encounter (Signed)
Stated that she was told she waited too long to pick it up, so they put it back.  Explained to her that prescription should still be at the pharmacy on [address].  Will send new prescription to local pharmacy ([facility]) so she will not have to get at [facility] for convenience.

## 2019-02-16 NOTE — Telephone Encounter (Signed)
Patient called stating that she is having her CTA this week. States that the pharmacy does not have her medication that she needs for procedure.  Please call [phone].

## 2019-02-18 ENCOUNTER — Telehealth (HOSPITAL_COMMUNITY): Payer: Self-pay | Admitting: Emergency Medicine

## 2019-02-18 ENCOUNTER — Telehealth: Payer: Self-pay | Admitting: Cardiovascular Disease

## 2019-02-18 DIAGNOSIS — Z01812 Encounter for preprocedural laboratory examination: Secondary | ICD-10-CM | POA: Diagnosis not present

## 2019-02-18 DIAGNOSIS — R0609 Other forms of dyspnea: Secondary | ICD-10-CM | POA: Diagnosis not present

## 2019-02-18 NOTE — Telephone Encounter (Signed)
Wants to know if the lab work she had done in March is ok for what Dr. Bronson Ing ordered.

## 2019-02-18 NOTE — Telephone Encounter (Signed)
Reaching out to patient to offer assistance regarding upcoming cardiac imaging study; pt verbalizes understanding of appt date/time, parking situation and where to check in, pre-test NPO status and medications ordered, and verified current allergies; name and call back number provided for further questions should they arise Marchia Bond RN [facility] [phone] office [phone] cell  Pt denies COVID19 symptoms, verbalized understanding of visitor policy, to take metoprolol 2 hr prior to test, lab draw today (results pending), avoiding metformin today and tomorrow

## 2019-02-18 NOTE — Telephone Encounter (Signed)
Informed patient that labs from 12/02/2018 would be too old.  She needs to have new labs done.  She will go today at [facility] across from [facility] as her Coronary CT is scheduled for tomorrow.  She verbalized understanding.

## 2019-02-19 ENCOUNTER — Ambulatory Visit (HOSPITAL_COMMUNITY)
Admission: RE | Admit: 2019-02-19 | Discharge: 2019-02-19 | Disposition: A | Discharge status: Home / Self Care | Payer: 59 | Source: Ambulatory Visit | Attending: Cardiovascular Disease | Admitting: Cardiovascular Disease

## 2019-02-19 ENCOUNTER — Ambulatory Visit (HOSPITAL_COMMUNITY): Payer: 59

## 2019-02-19 ENCOUNTER — Other Ambulatory Visit: Payer: Self-pay

## 2019-02-19 DIAGNOSIS — R0609 Other forms of dyspnea: Secondary | ICD-10-CM | POA: Insufficient documentation

## 2019-02-19 DIAGNOSIS — Z006 Encounter for examination for normal comparison and control in clinical research program: Secondary | ICD-10-CM

## 2019-02-19 DIAGNOSIS — R0600 Dyspnea, unspecified: Secondary | ICD-10-CM

## 2019-02-19 DIAGNOSIS — R9431 Abnormal electrocardiogram [ECG] [EKG]: Secondary | ICD-10-CM | POA: Insufficient documentation

## 2019-02-19 LAB — BASIC METABOLIC PANEL
BUN/Creatinine Ratio: 13 (calc) (ref 6–22)
BUN: 14 mg/dL (ref 7–25)
CO2: 26 mmol/L (ref 20–32)
Calcium: 9.1 mg/dL (ref 8.6–10.4)
Chloride: 103 mmol/L (ref 98–110)
Creat: 1.09 mg/dL — ABNORMAL HIGH (ref 0.50–1.05)
Glucose, Bld: 94 mg/dL (ref 65–139)
Potassium: 4 mmol/L (ref 3.5–5.3)
Sodium: 139 mmol/L (ref 135–146)

## 2019-02-19 MED ORDER — NITROGLYCERIN 0.4 MG SL SUBL
SUBLINGUAL_TABLET | SUBLINGUAL | Status: AC
  Filled 2019-02-19: qty 2

## 2019-02-19 MED ORDER — IOHEXOL 350 MG/ML SOLN
100.0000 mL | Freq: Once | INTRAVENOUS | Status: AC | PRN
  Administered 2019-02-19: 10:00:00 100 mL via INTRAVENOUS

## 2019-02-19 MED ORDER — NITROGLYCERIN 0.4 MG SL SUBL
0.8000 mg | SUBLINGUAL_TABLET | Freq: Once | SUBLINGUAL | Status: AC
  Administered 2019-02-19: 0.8 mg via SUBLINGUAL
  Filled 2019-02-19: qty 25

## 2019-02-19 NOTE — Research (Addendum)
Cadfem Informed Consent    Patient Leslie Wright    Subject met inclusion and exclusion criteria.  The informed consent form, study requirements and expectations were reviewed with the subject and questions and concerns were addressed prior to the signing of the consent form.  The subject verbalized understanding of the trail requirements.  The subject agreed to participate in the CADFEM trial and signed the informed consent.  The informed consent was obtained prior to performance of any protocol-specific procedures for the subject.  A copy of the signed informed consent was given to the subject and a copy was placed in the subject's medical record.   Neva Seat

## 2019-02-24 ENCOUNTER — Telehealth: Payer: Self-pay | Admitting: *Deleted

## 2019-02-24 NOTE — Telephone Encounter (Signed)
Patient verbally consented for telehealth visits with [facility] and understands that her insurance company will be billed for the encounter.   Unable to check vitals

## 2019-02-24 NOTE — Telephone Encounter (Signed)
-----   Message from Arnoldo Lenis, MD sent at 02/20/2019  4:08 PM EDT ----- Coronary CTA looks good, the arteries of the heart are normal without any signs of plaque or blockages. Dr Raliegh Ip to discuss at f/u   Zandra Abts MD

## 2019-02-24 NOTE — Telephone Encounter (Signed)
Pt voiced understanding - routed to pcp  

## 2019-02-25 ENCOUNTER — Other Ambulatory Visit: Payer: Self-pay

## 2019-02-25 ENCOUNTER — Telehealth (INDEPENDENT_AMBULATORY_CARE_PROVIDER_SITE_OTHER): Payer: 59 | Admitting: Cardiovascular Disease

## 2019-02-25 ENCOUNTER — Encounter: Payer: Self-pay | Admitting: Cardiovascular Disease

## 2019-02-25 VITALS — Ht 67.0 in | Wt 295.0 lb

## 2019-02-25 DIAGNOSIS — Z7982 Long term (current) use of aspirin: Secondary | ICD-10-CM | POA: Diagnosis not present

## 2019-02-25 DIAGNOSIS — I825Z1 Chronic embolism and thrombosis of unspecified deep veins of right distal lower extremity: Secondary | ICD-10-CM

## 2019-02-25 DIAGNOSIS — R0600 Dyspnea, unspecified: Secondary | ICD-10-CM

## 2019-02-25 DIAGNOSIS — Z86718 Personal history of other venous thrombosis and embolism: Secondary | ICD-10-CM

## 2019-02-25 DIAGNOSIS — D649 Anemia, unspecified: Secondary | ICD-10-CM | POA: Diagnosis not present

## 2019-02-25 DIAGNOSIS — E7800 Pure hypercholesterolemia, unspecified: Secondary | ICD-10-CM

## 2019-02-25 DIAGNOSIS — R0609 Other forms of dyspnea: Secondary | ICD-10-CM

## 2019-02-25 DIAGNOSIS — M7989 Other specified soft tissue disorders: Secondary | ICD-10-CM | POA: Diagnosis not present

## 2019-02-25 DIAGNOSIS — R600 Localized edema: Secondary | ICD-10-CM

## 2019-02-25 DIAGNOSIS — E7849 Other hyperlipidemia: Secondary | ICD-10-CM

## 2019-02-25 MED ORDER — ATORVASTATIN CALCIUM 20 MG PO TABS: 20.0000 mg | ORAL_TABLET | Freq: Every day | ORAL | 1 refills | Status: DC

## 2019-02-25 NOTE — Patient Instructions (Addendum)
Medication Instructions:   Lipitor refill sent to pharmacy today.   90 day supply with one refill sent today (total 6 month supply).  Future refills can be maintained by family doctor since he will only be seeing you back as needed.    Continue all other medications.    Labwork:  CBC - order enclosed.  You may do this lab at [facility] or at [facility] that is located across the street from hospital.   Office will contact with results via phone or letter.    Testing/Procedures: none  Follow-Up: As needed   Any Other Special Instructions Will Be Listed Below (If Applicable).  If you need a refill on your cardiac medications before your next appointment, please call your pharmacy.

## 2019-02-25 NOTE — Addendum Note (Signed)
Addended by: Laurine Blazer on: 02/25/2019 11:18 AM   Modules accepted: Orders

## 2019-02-25 NOTE — Progress Notes (Signed)
Virtual Visit via Phone Note (multiple attempts at video call were made but could not be completed due to technical issues)   This visit type was conducted due to national recommendations for restrictions regarding the COVID-19 Pandemic (e.g. social distancing) in an effort to limit this patient's exposure and mitigate transmission in our community.  Due to her co-morbid illnesses, this patient is at least at moderate risk for complications without adequate follow up.  This format is felt to be most appropriate for this patient at this time.  All issues noted in this document were discussed and addressed.  A limited physical exam was performed with this format.  Please refer to the patient's chart for her consent to telehealth for [facility].   Date:  02/25/2019   ID:  Leslie Wright, DOB 10-30-1965, MRN [id number]  Patient Location: Home Provider Location: Office  PCP:  Fanny Bien, MD  Cardiologist:  Kate Sable, MD  Electrophysiologist:  None   Evaluation Performed:  Follow-Up Visit  Chief Complaint: Exertional dyspnea and left leg swelling  History of Present Illness:    Leslie Wright is a 53 y.o. female with obesity, prediabetes, DVT and hyperlipidemia.  I initially evaluated her for exertional dyspnea and left leg swelling on 11/12/2018.  In summary, she underwent left hip replacement surgery in the summer 2019.  She subsequently developed acute DVT in the right femoral, popliteal, posterior tibial, peroneal, and gastrocnemius veins demonstrated by Dopplers on 05/14/2018.  She underwent follow-up lower extremity Dopplers on 09/11/2018.  This showed no evidence of DVT in the left leg.  The right leg DVT had improved.  She is staying with her elderly aunt to help take care of her.  She continues to experience progressive exertional dyspnea now with simple activities such as taking a shower.  She is fatigued.  The patient does not have symptoms concerning for  COVID-19 infection (fever, chills, cough, or new shortness of breath).    Past Medical History:  Diagnosis Date  . Acid reflux   . Anemia   . Arthritis    "maybe in my fingers" (03/26/2018)  . Family history of adverse reaction to anesthesia    "brother, Otila Kluver, and my mom always get sick" (03/26/2018)  . Gallbladder problem   . High blood pressure   . High cholesterol   . Hip pain    "left; before OR" (03/26/2018)  . History of kidney stones   . OSA (obstructive sleep apnea)    "can't tolerate the mask" (03/26/2018)  . Pre-diabetes   . Shortness of breath   . Swelling of lower extremity    "that's why they put me on fluid pill" (03/26/2018)   Past Surgical History:  Procedure Laterality Date  . CARPAL TUNNEL RELEASE Right ~ 2006  . EYE SURGERY    . JOINT REPLACEMENT    . LAPAROSCOPIC CHOLECYSTECTOMY  12/2010  . LASIK Bilateral 2000  . TOTAL HIP ARTHROPLASTY Left 03/25/2018   Procedure: LEFT TOTAL HIP ARTHROPLASTY ANTERIOR APPROACH;  Surgeon: Mcarthur Rossetti, MD;  Location: [facility];  Service: Orthopedics;  Laterality: Left;     Current Meds  Medication Sig  . acetaminophen (TYLENOL) 650 MG CR tablet Take 650 mg by mouth See admin instructions. TAKE 1 TABLET (650 MG) BY MOUTH SCHEDULED IN THE MORNING, MAY REPEAT DOSE IN THE EVENING IF NEEDED FOR PAIN.  Marland Kitchen aspirin EC 81 MG tablet Take 81 mg by mouth daily.  . Cyanocobalamin (B-12) 2500 MCG TABS  Take 2,500 mcg by mouth daily.  . hydrochlorothiazide (MICROZIDE) 12.5 MG capsule Take 1 capsule (12.5 mg total) by mouth daily.  . metFORMIN (GLUCOPHAGE) 500 MG tablet Take 1 tablet (500 mg total) by mouth daily with breakfast.  . metoprolol tartrate (LOPRESSOR) 100 MG tablet Take 1 tablet (100 mg total) by mouth once for 1 dose.  . Vitamin D, Ergocalciferol, (DRISDOL) 1.25 MG (50000 UT) CAPS capsule Take 1 capsule (50,000 Units total) by mouth every 7 (seven) days.     Allergies:   Patient has no known allergies.   Social History    Tobacco Use  . Smoking status: Never Smoker  . Smokeless tobacco: Never Used  Substance Use Topics  . Alcohol use: Not Currently  . Drug use: Never     Family Hx: The patient's family history includes Colon polyps in her father; Diabetes in her mother; Heart disease in her father; Hyperlipidemia in her father; Hypertension in her father; Liver disease in her mother; Obesity in her father and mother; Prostate cancer in her brother; Stroke in her father; Thyroid disease in her mother. There is no history of Colon cancer.  ROS:   Please see the history of present illness.     All other systems reviewed and are negative.   Prior CV studies:   The following studies were reviewed today:  Coronary CT angiogram 02/19/2019:  IMPRESSION: 1. No evidence of CAD, CADRADS = 0.  2. Coronary calcium score of 0.  3. Normal coronary origin with right dominance.  IMPRESSION: No acute or significant extracardiac abnormality.   Echocardiogram 12/03/2018:   1. The left ventricle has normal systolic function with an ejection fraction of 60-65%. The cavity size was normal. Left ventricular diastolic Doppler parameters are indeterminate No evidence of left ventricular regional wall motion abnormalities.  2. The right ventricle has normal systolic function. The cavity was normal. There is no increase in right ventricular wall thickness.  3. The mitral valve is normal in structure. There is mild mitral annular calcification present.  4. The tricuspid valve is normal in structure.  5. The aortic valve is tricuspid.  6. The aortic root is normal in size and structure.  7. No evidence of left ventricular regional wall motion abnormalities.  8. The interatrial septum was not well visualized.  Labs/Other Tests and Data Reviewed:    EKG:  No ECG reviewed.  Recent Labs: 03/26/2018: Hemoglobin 10.9; Platelets 199 12/02/2018: ALT 17 02/18/2019: BUN 14; Creat 1.09; Potassium 4.0; Sodium 139   Recent  Lipid Panel Lab Results  Component Value Date/Time   CHOL 170 05/19/2018   CHOL 163 11/06/2017 08:50 AM   TRIG 119 05/19/2018   HDL 45 05/19/2018   HDL 36 (L) 11/06/2017 08:50 AM   LDLCALC 101 05/19/2018   LDLCALC 101 (H) 11/06/2017 08:50 AM    Wt Readings from Last 3 Encounters:  02/25/19 295 lb (133.8 kg)  12/02/18 280 lb (127 kg)  11/12/18 284 lb (128.8 kg)     Objective:    Vital Signs:  Ht 5\' 7"  (1.702 m)   Wt 295 lb (133.8 kg)   BMI 46.20 kg/m    VITAL SIGNS:  reviewed  ASSESSMENT & PLAN:    1.  Dyspnea on exertion: Coronary CT angiogram reviewed above with no evidence of CAD and a calcium score of 0.  Echocardiogram demonstrated normal left ventricular systolic function, LVEF 60 to 65%.  The most recent CBC I find in the EMR is dated 03/26/2018  and showed a mildly low hemoglobin of 10.9.  I will repeat a CBC to see if she has developed a worsening anemia.  If this is unremarkable, I would recommend pulmonary function testing to be pursued by her PCP.  2.  Left leg swelling: No DVT by Dopplers in December 2019 as detailed above.  She may have leg edema due to obesity and abdominal venous compression.  She has been wearing TED hose which have helped.  Echocardiogram reviewed above demonstrating normal left ventricular systolic function, LVEF 60 to 65%.  Diastolic Doppler parameters were indeterminate.  3.  Right leg DVT: Dopplers reviewed above.  She is on aspirin.  She had been on Xarelto.  4.  Anemia: The most recent CBC I find in the EMR is dated 03/26/2018 and showed a mildly low hemoglobin of 10.9.  I will repeat a CBC to see if she has developed a worsening anemia.  5.  Hypercholesterolemia: I will refill her Lipitor.   COVID-19 Education: The signs and symptoms of COVID-19 were discussed with the patient and how to seek care for testing (follow up with PCP or arrange E-visit).  The importance of social distancing was discussed today.  Time:   Today, I have spent  25 minutes with the patient with telehealth technology discussing the above problems.     Medication Adjustments/Labs and Tests Ordered: Current medicines are reviewed at length with the patient today.  Concerns regarding medicines are outlined above.   Tests Ordered: No orders of the defined types were placed in this encounter.   Medication Changes: No orders of the defined types were placed in this encounter.   Disposition:  Follow up prn  Signed, Kate Sable, MD  02/25/2019 10:33 AM    [place]

## 2019-03-11 ENCOUNTER — Ambulatory Visit: Payer: Self-pay | Admitting: Orthopaedic Surgery

## 2019-03-12 MED FILL — ATORVASTATIN 20 MG TABLET: 20 | 90 days supply | Qty: 90 | Fill #0

## 2019-03-13 DIAGNOSIS — D649 Anemia, unspecified: Secondary | ICD-10-CM | POA: Diagnosis not present

## 2019-03-13 LAB — CBC
HCT: 39.3 % (ref 35.0–45.0)
Hemoglobin: 13 g/dL (ref 11.7–15.5)
MCH: 27.8 pg (ref 27.0–33.0)
MCHC: 33.1 g/dL (ref 32.0–36.0)
MCV: 84 fL (ref 80.0–100.0)
MPV: 10.7 fL (ref 7.5–12.5)
Platelets: 246 10*3/uL (ref 140–400)
RBC: 4.68 10*6/uL (ref 3.80–5.10)
RDW: 16 % — ABNORMAL HIGH (ref 11.0–15.0)
WBC: 9.7 10*3/uL (ref 3.8–10.8)

## 2019-04-14 DIAGNOSIS — Z0000 Encounter for general adult medical examination without abnormal findings: Secondary | ICD-10-CM | POA: Diagnosis not present

## 2019-04-17 DIAGNOSIS — Z1159 Encounter for screening for other viral diseases: Secondary | ICD-10-CM | POA: Diagnosis not present

## 2019-04-23 DIAGNOSIS — Z6841 Body Mass Index (BMI) 40.0 and over, adult: Secondary | ICD-10-CM | POA: Diagnosis not present

## 2019-04-23 DIAGNOSIS — Z0000 Encounter for general adult medical examination without abnormal findings: Secondary | ICD-10-CM | POA: Diagnosis not present

## 2019-04-23 DIAGNOSIS — Z23 Encounter for immunization: Secondary | ICD-10-CM | POA: Diagnosis not present

## 2019-04-23 MED FILL — ROSUVASTATIN CALCIUM 10 MG: 10 | 90 days supply | Qty: 90 | Fill #0

## 2019-04-23 MED FILL — LOSARTAN POTASSIUM 25 MG TA: 25 | 30 days supply | Qty: 30 | Fill #0

## 2019-04-29 ENCOUNTER — Other Ambulatory Visit: Payer: Self-pay

## 2019-04-29 ENCOUNTER — Encounter: Payer: Self-pay | Admitting: Orthopaedic Surgery

## 2019-04-29 ENCOUNTER — Ambulatory Visit (INDEPENDENT_AMBULATORY_CARE_PROVIDER_SITE_OTHER): Payer: 59 | Admitting: Orthopaedic Surgery

## 2019-04-29 ENCOUNTER — Ambulatory Visit (INDEPENDENT_AMBULATORY_CARE_PROVIDER_SITE_OTHER): Payer: 59

## 2019-04-29 DIAGNOSIS — M5441 Lumbago with sciatica, right side: Secondary | ICD-10-CM

## 2019-04-29 DIAGNOSIS — G8929 Other chronic pain: Secondary | ICD-10-CM

## 2019-04-29 DIAGNOSIS — Z96642 Presence of left artificial hip joint: Secondary | ICD-10-CM

## 2019-04-29 NOTE — Progress Notes (Signed)
Office Visit Note   Patient: DEMIAH Wright           Date of Birth: Jan 23, 1966           MRN: [id number] Visit Date: 04/29/2019              Requested by: Fanny Bien, [address] [place],  [place] [address] PCP: Fanny Bien, MD   Assessment & Plan: Visit Diagnoses:  1. History of left hip replacement   2. Chronic right-sided low back pain with right-sided sciatica     Plan: I am concerned about the extent of her spondylolisthesis at L5-S1 that was seen on previous plain films.  At this point an MRI of her lumbar spine is warranted to assess for stenosis or nerve compression as well as assess her facet joints to determine whether or not intervention may be needed.  We will follow-up after the MRI.  Follow-Up Instructions: No follow-ups on file.   Orders:  Orders Placed This Encounter  Procedures  . XR HIP UNILAT W OR W/O PELVIS 1V LEFT   No orders of the defined types were placed in this encounter.     Procedures: No procedures performed   Clinical Data: No additional findings.   Subjective: Chief Complaint  Patient presents with  . Left Hip - Follow-up  Leslie Wright is now 13 months status post a left total hip arthroplasty.  She says her hip is doing wonderful and she has no issues or pain with that at all.  She has been dealing with chronic low back pain for some time now.  It does radiate into what I believe is her right sciatic area.  That is the only issue that she is having.  She has had plain films of her lumbar spine in the past  HPI  Review of Systems She currently denies any headache, chest pain, shortness of breath, fever, chills, nausea, vomiting  Objective: Vital Signs: There were no vitals taken for this visit.  Physical Exam She is alert and orient x3 and in no acute distress Ortho Exam Examination of her left hip shows that it moves smoothly with no pain at all.  Her right hip exam is also normal.  She does seem to have a  positive straight leg raise on the right side.  She has significant pain in the lower aspect of her lumbar spine and this hurts significantly on extension of her back. Specialty Comments:  No specialty comments available.  Imaging: Xr Hip Unilat W Or W/o Pelvis 1v Left  Result Date: 04/29/2019 An AP pelvis and lateral of the left hip shows a well-seated total hip arthroplasty with no complicating features.  X-rays of her spine from 2018 showed quite a significant spondylolisthesis at L5-S1 with also severe degenerative changes with loss of disc height at L3-L4 and L4-L5 as well as L5-S1.  PMFS History: Patient Active Problem List   Diagnosis Date Noted  . Status post left hip replacement 05/19/2018  . Acute deep vein thrombosis (DVT) of distal end of right lower extremity ([place]) 05/19/2018  . Unilateral primary osteoarthritis, left hip 03/25/2018  . Status post total replacement of left hip 03/25/2018  . Hyperlipidemia 04/11/2017  . Class 3 obesity with serious comorbidity and body mass index (BMI) of 40.0 to 44.9 in adult 04/11/2017  . Prediabetes 02/04/2017  . Mixed hyperlipidemia 01/09/2017  . Insulin resistance 11/27/2016  . Essential hypertension 10/25/2016  . Morbid obesity ([place])  10/25/2016  . Vitamin D deficiency 10/25/2016   Past Medical History:  Diagnosis Date  . Acid reflux   . Anemia   . Arthritis    "maybe in my fingers" (03/26/2018)  . Family history of adverse reaction to anesthesia    "brother, Otila Kluver, and my mom always get sick" (03/26/2018)  . Gallbladder problem   . High blood pressure   . High cholesterol   . Hip pain    "left; before OR" (03/26/2018)  . History of kidney stones   . OSA (obstructive sleep apnea)    "can't tolerate the mask" (03/26/2018)  . Pre-diabetes   . Shortness of breath   . Swelling of lower extremity    "that's why they put me on fluid pill" (03/26/2018)    Family History  Problem Relation Age of Onset  . Diabetes Mother   .  Thyroid disease Mother   . Liver disease Mother   . Obesity Mother   . Hypertension Father   . Hyperlipidemia Father   . Heart disease Father   . Stroke Father   . Obesity Father   . Colon polyps Father   . Prostate cancer Brother   . Colon cancer Neg Hx        No one in family with colon cancer.    Past Surgical History:  Procedure Laterality Date  . CARPAL TUNNEL RELEASE Right ~ 2006  . EYE SURGERY    . JOINT REPLACEMENT    . LAPAROSCOPIC CHOLECYSTECTOMY  12/2010  . LASIK Bilateral 2000  . TOTAL HIP ARTHROPLASTY Left 03/25/2018   Procedure: LEFT TOTAL HIP ARTHROPLASTY ANTERIOR APPROACH;  Surgeon: Mcarthur Rossetti, MD;  Location: [facility];  Service: Orthopedics;  Laterality: Left;   Social History   Occupational History  . Occupation: Sport and exercise psychologist: [employer]  Tobacco Use  . Smoking status: Never Smoker  . Smokeless tobacco: Never Used  Substance and Sexual Activity  . Alcohol use: Not Currently  . Drug use: Never  . Sexual activity: Not Currently

## 2019-04-30 ENCOUNTER — Other Ambulatory Visit: Payer: Self-pay

## 2019-04-30 DIAGNOSIS — M4807 Spinal stenosis, lumbosacral region: Secondary | ICD-10-CM

## 2019-05-26 DIAGNOSIS — Z23 Encounter for immunization: Secondary | ICD-10-CM | POA: Diagnosis not present

## 2019-06-01 ENCOUNTER — Ambulatory Visit
Admission: RE | Admit: 2019-06-01 | Discharge: 2019-06-01 | Disposition: A | Discharge status: Home / Self Care | Payer: 59 | Source: Ambulatory Visit | Attending: Orthopaedic Surgery | Admitting: Orthopaedic Surgery

## 2019-06-01 ENCOUNTER — Other Ambulatory Visit: Payer: Self-pay

## 2019-06-01 DIAGNOSIS — M5127 Other intervertebral disc displacement, lumbosacral region: Secondary | ICD-10-CM | POA: Diagnosis not present

## 2019-06-01 DIAGNOSIS — M4807 Spinal stenosis, lumbosacral region: Secondary | ICD-10-CM

## 2019-06-01 DIAGNOSIS — M47816 Spondylosis without myelopathy or radiculopathy, lumbar region: Secondary | ICD-10-CM | POA: Diagnosis not present

## 2019-06-03 ENCOUNTER — Other Ambulatory Visit: Payer: 59

## 2019-06-04 DIAGNOSIS — E782 Mixed hyperlipidemia: Secondary | ICD-10-CM | POA: Diagnosis not present

## 2019-06-04 DIAGNOSIS — E559 Vitamin D deficiency, unspecified: Secondary | ICD-10-CM | POA: Diagnosis not present

## 2019-06-04 DIAGNOSIS — R7303 Prediabetes: Secondary | ICD-10-CM | POA: Diagnosis not present

## 2019-06-04 DIAGNOSIS — I10 Essential (primary) hypertension: Secondary | ICD-10-CM | POA: Diagnosis not present

## 2019-06-12 DIAGNOSIS — R600 Localized edema: Secondary | ICD-10-CM | POA: Diagnosis not present

## 2019-06-12 DIAGNOSIS — E782 Mixed hyperlipidemia: Secondary | ICD-10-CM | POA: Diagnosis not present

## 2019-06-12 DIAGNOSIS — I10 Essential (primary) hypertension: Secondary | ICD-10-CM | POA: Diagnosis not present

## 2019-06-12 DIAGNOSIS — E119 Type 2 diabetes mellitus without complications: Secondary | ICD-10-CM | POA: Diagnosis not present

## 2019-06-12 DIAGNOSIS — E559 Vitamin D deficiency, unspecified: Secondary | ICD-10-CM | POA: Diagnosis not present

## 2019-07-06 DIAGNOSIS — R519 Headache, unspecified: Secondary | ICD-10-CM | POA: Diagnosis not present

## 2019-07-06 DIAGNOSIS — H00016 Hordeolum externum left eye, unspecified eyelid: Secondary | ICD-10-CM | POA: Diagnosis not present

## 2019-07-06 DIAGNOSIS — Z1159 Encounter for screening for other viral diseases: Secondary | ICD-10-CM | POA: Diagnosis not present

## 2019-07-06 DIAGNOSIS — L0291 Cutaneous abscess, unspecified: Secondary | ICD-10-CM | POA: Diagnosis not present

## 2019-07-06 DIAGNOSIS — R5383 Other fatigue: Secondary | ICD-10-CM | POA: Diagnosis not present

## 2019-07-06 MED FILL — CEPHALEXIN 500 MG CAPSULE: 500 | 10 days supply | Qty: 40 | Fill #0

## 2019-07-08 DIAGNOSIS — N181 Chronic kidney disease, stage 1: Secondary | ICD-10-CM | POA: Diagnosis not present

## 2019-07-08 DIAGNOSIS — L0291 Cutaneous abscess, unspecified: Secondary | ICD-10-CM | POA: Diagnosis not present

## 2019-07-08 DIAGNOSIS — I10 Essential (primary) hypertension: Secondary | ICD-10-CM | POA: Diagnosis not present

## 2019-07-08 MED FILL — LOSARTAN POTASSIUM 50 MG TA: 50 | 30 days supply | Qty: 30 | Fill #0

## 2019-07-22 DIAGNOSIS — I10 Essential (primary) hypertension: Secondary | ICD-10-CM | POA: Diagnosis not present

## 2019-07-22 DIAGNOSIS — E782 Mixed hyperlipidemia: Secondary | ICD-10-CM | POA: Diagnosis not present

## 2019-07-22 DIAGNOSIS — L0291 Cutaneous abscess, unspecified: Secondary | ICD-10-CM | POA: Diagnosis not present

## 2019-09-04 DIAGNOSIS — R7303 Prediabetes: Secondary | ICD-10-CM | POA: Diagnosis not present

## 2019-09-04 DIAGNOSIS — I10 Essential (primary) hypertension: Secondary | ICD-10-CM | POA: Diagnosis not present

## 2019-09-04 DIAGNOSIS — E782 Mixed hyperlipidemia: Secondary | ICD-10-CM | POA: Diagnosis not present

## 2019-09-11 DIAGNOSIS — E782 Mixed hyperlipidemia: Secondary | ICD-10-CM | POA: Diagnosis not present

## 2019-09-11 DIAGNOSIS — I10 Essential (primary) hypertension: Secondary | ICD-10-CM | POA: Diagnosis not present

## 2019-09-11 DIAGNOSIS — Z6841 Body Mass Index (BMI) 40.0 and over, adult: Secondary | ICD-10-CM | POA: Diagnosis not present

## 2019-09-11 DIAGNOSIS — E559 Vitamin D deficiency, unspecified: Secondary | ICD-10-CM | POA: Diagnosis not present

## 2019-09-11 DIAGNOSIS — E119 Type 2 diabetes mellitus without complications: Secondary | ICD-10-CM | POA: Diagnosis not present

## 2019-09-11 MED FILL — metFORMIN HCL 500 MG TABS: 500 | 90 days supply | Qty: 45 | Fill #0

## 2019-09-11 MED FILL — LOSARTAN POTASSIUM 50 MG TA: 50 | 30 days supply | Qty: 30 | Fill #0

## 2019-12-15 DIAGNOSIS — E119 Type 2 diabetes mellitus without complications: Secondary | ICD-10-CM | POA: Diagnosis not present

## 2019-12-15 DIAGNOSIS — I10 Essential (primary) hypertension: Secondary | ICD-10-CM | POA: Diagnosis not present

## 2019-12-15 DIAGNOSIS — E559 Vitamin D deficiency, unspecified: Secondary | ICD-10-CM | POA: Diagnosis not present

## 2019-12-18 DIAGNOSIS — I10 Essential (primary) hypertension: Secondary | ICD-10-CM | POA: Diagnosis not present

## 2019-12-18 DIAGNOSIS — E559 Vitamin D deficiency, unspecified: Secondary | ICD-10-CM | POA: Diagnosis not present

## 2019-12-18 DIAGNOSIS — E119 Type 2 diabetes mellitus without complications: Secondary | ICD-10-CM | POA: Diagnosis not present

## 2019-12-18 MED FILL — NYSTATIN 100,000 UNIT/GM CR: 100000 | 7 days supply | Qty: 15 | Fill #0

## 2020-02-19 DIAGNOSIS — L400 Psoriasis vulgaris: Secondary | ICD-10-CM | POA: Diagnosis not present

## 2020-02-19 MED FILL — TRIAMCINOLONE ACETONIDE 0.1: 0.1 | 30 days supply | Qty: 454 | Fill #0

## 2020-02-24 DIAGNOSIS — Z79899 Other long term (current) drug therapy: Secondary | ICD-10-CM | POA: Diagnosis not present

## 2020-02-24 DIAGNOSIS — L400 Psoriasis vulgaris: Secondary | ICD-10-CM | POA: Diagnosis not present

## 2020-03-22 MED FILL — METHOTREXATE 2.5 MG TAB: 2.5 | 28 days supply | Qty: 24 | Fill #0

## 2020-03-22 MED FILL — FOLIC ACID 1 MG TABS: 1 | 30 days supply | Qty: 30 | Fill #0

## 2020-04-21 DIAGNOSIS — Z1159 Encounter for screening for other viral diseases: Secondary | ICD-10-CM | POA: Diagnosis not present

## 2020-04-21 DIAGNOSIS — Z0000 Encounter for general adult medical examination without abnormal findings: Secondary | ICD-10-CM | POA: Diagnosis not present

## 2020-06-01 DIAGNOSIS — E119 Type 2 diabetes mellitus without complications: Secondary | ICD-10-CM | POA: Diagnosis not present

## 2020-06-20 DIAGNOSIS — R7303 Prediabetes: Secondary | ICD-10-CM | POA: Diagnosis not present

## 2020-06-20 DIAGNOSIS — Z118 Encounter for screening for other infectious and parasitic diseases: Secondary | ICD-10-CM | POA: Diagnosis not present

## 2020-06-20 DIAGNOSIS — Z1151 Encounter for screening for human papillomavirus (HPV): Secondary | ICD-10-CM | POA: Diagnosis not present

## 2020-06-20 DIAGNOSIS — Z23 Encounter for immunization: Secondary | ICD-10-CM | POA: Diagnosis not present

## 2020-06-20 DIAGNOSIS — Z0000 Encounter for general adult medical examination without abnormal findings: Secondary | ICD-10-CM | POA: Diagnosis not present

## 2020-06-20 DIAGNOSIS — Z113 Encounter for screening for infections with a predominantly sexual mode of transmission: Secondary | ICD-10-CM | POA: Diagnosis not present

## 2020-06-20 DIAGNOSIS — Z01419 Encounter for gynecological examination (general) (routine) without abnormal findings: Secondary | ICD-10-CM | POA: Diagnosis not present

## 2020-06-20 DIAGNOSIS — Z1211 Encounter for screening for malignant neoplasm of colon: Secondary | ICD-10-CM | POA: Diagnosis not present

## 2020-06-23 ENCOUNTER — Other Ambulatory Visit: Payer: Self-pay | Admitting: Family Medicine

## 2020-06-23 DIAGNOSIS — E289 Ovarian dysfunction, unspecified: Secondary | ICD-10-CM

## 2020-06-30 DIAGNOSIS — L400 Psoriasis vulgaris: Secondary | ICD-10-CM | POA: Diagnosis not present

## 2020-06-30 DIAGNOSIS — Z79899 Other long term (current) drug therapy: Secondary | ICD-10-CM | POA: Diagnosis not present

## 2020-06-30 MED FILL — TRIAMCINOLONE 0.1% CREAM: 0.1 | 30 days supply | Qty: 454 | Fill #0

## 2020-06-30 MED FILL — FOLIC ACID 1 MG TABS: 1 | 90 days supply | Qty: 90 | Fill #0

## 2020-06-30 MED FILL — METHOTREXATE SODIUM 2.5 MG: 2.5 | 84 days supply | Qty: 72 | Fill #0

## 2020-07-11 ENCOUNTER — Other Ambulatory Visit: Payer: Self-pay | Admitting: Family Medicine

## 2020-07-11 DIAGNOSIS — Z1231 Encounter for screening mammogram for malignant neoplasm of breast: Secondary | ICD-10-CM

## 2020-10-21 ENCOUNTER — Other Ambulatory Visit: Payer: 59

## 2020-11-29 ENCOUNTER — Ambulatory Visit: Payer: 59

## 2020-11-30 ENCOUNTER — Other Ambulatory Visit: Payer: Self-pay | Admitting: Family Medicine

## 2020-11-30 DIAGNOSIS — N63 Unspecified lump in unspecified breast: Secondary | ICD-10-CM

## 2020-12-02 ENCOUNTER — Other Ambulatory Visit (HOSPITAL_COMMUNITY): Payer: Self-pay | Admitting: Family Medicine

## 2020-12-02 DIAGNOSIS — I10 Essential (primary) hypertension: Secondary | ICD-10-CM | POA: Diagnosis not present

## 2020-12-02 DIAGNOSIS — R7303 Prediabetes: Secondary | ICD-10-CM | POA: Diagnosis not present

## 2020-12-02 DIAGNOSIS — E782 Mixed hyperlipidemia: Secondary | ICD-10-CM | POA: Diagnosis not present

## 2020-12-02 MED FILL — FREESTYLE LITE TEST STRIP: 33 days supply | Qty: 100 | Fill #0

## 2020-12-02 MED FILL — LOSARTAN POTASSIUM 50 MG TA: 50 | 30 days supply | Qty: 30 | Fill #0

## 2020-12-02 MED FILL — FREESTYLE LITE METER: W/DEVICE | 30 days supply | Qty: 1 | Fill #0

## 2020-12-02 MED FILL — ROSUVASTATIN CALCIUM 10 MG: 10 | 90 days supply | Qty: 90 | Fill #0

## 2020-12-02 MED FILL — FREESTYLE LANCETS: 30 days supply | Qty: 100 | Fill #0

## 2020-12-20 ENCOUNTER — Other Ambulatory Visit (HOSPITAL_BASED_OUTPATIENT_CLINIC_OR_DEPARTMENT_OTHER): Payer: Self-pay

## 2020-12-23 ENCOUNTER — Other Ambulatory Visit (HOSPITAL_BASED_OUTPATIENT_CLINIC_OR_DEPARTMENT_OTHER): Payer: Self-pay

## 2020-12-30 DIAGNOSIS — C50919 Malignant neoplasm of unspecified site of unspecified female breast: Secondary | ICD-10-CM

## 2020-12-30 HISTORY — DX: Malignant neoplasm of unspecified site of unspecified female breast: C50.919

## 2021-01-03 ENCOUNTER — Other Ambulatory Visit (HOSPITAL_COMMUNITY): Payer: Self-pay

## 2021-01-03 DIAGNOSIS — L0291 Cutaneous abscess, unspecified: Secondary | ICD-10-CM | POA: Diagnosis not present

## 2021-01-03 DIAGNOSIS — E782 Mixed hyperlipidemia: Secondary | ICD-10-CM | POA: Diagnosis not present

## 2021-01-03 DIAGNOSIS — Z6841 Body Mass Index (BMI) 40.0 and over, adult: Secondary | ICD-10-CM | POA: Diagnosis not present

## 2021-01-03 DIAGNOSIS — R7303 Prediabetes: Secondary | ICD-10-CM | POA: Diagnosis not present

## 2021-01-03 DIAGNOSIS — I10 Essential (primary) hypertension: Secondary | ICD-10-CM | POA: Diagnosis not present

## 2021-01-03 MED ORDER — CEPHALEXIN 500 MG PO CAPS
ORAL_CAPSULE | ORAL | 0 refills | Status: DC
  Filled 2021-01-03: qty 40, 10d supply, fill #0

## 2021-01-12 ENCOUNTER — Other Ambulatory Visit: Payer: Self-pay | Admitting: Family Medicine

## 2021-01-12 ENCOUNTER — Other Ambulatory Visit: Payer: Self-pay

## 2021-01-12 ENCOUNTER — Ambulatory Visit
Admission: RE | Admit: 2021-01-12 | Discharge: 2021-01-12 | Disposition: A | Discharge status: Home / Self Care | Payer: 59 | Source: Ambulatory Visit | Attending: Family Medicine | Admitting: Family Medicine

## 2021-01-12 DIAGNOSIS — N63 Unspecified lump in unspecified breast: Secondary | ICD-10-CM

## 2021-01-12 DIAGNOSIS — R921 Mammographic calcification found on diagnostic imaging of breast: Secondary | ICD-10-CM | POA: Diagnosis not present

## 2021-01-12 DIAGNOSIS — N6314 Unspecified lump in the right breast, lower inner quadrant: Secondary | ICD-10-CM | POA: Diagnosis not present

## 2021-01-12 DIAGNOSIS — N6321 Unspecified lump in the left breast, upper outer quadrant: Secondary | ICD-10-CM | POA: Diagnosis not present

## 2021-01-12 DIAGNOSIS — E289 Ovarian dysfunction, unspecified: Secondary | ICD-10-CM

## 2021-01-12 DIAGNOSIS — N6313 Unspecified lump in the right breast, lower outer quadrant: Secondary | ICD-10-CM | POA: Diagnosis not present

## 2021-01-13 ENCOUNTER — Other Ambulatory Visit: Payer: Self-pay | Admitting: Family Medicine

## 2021-01-13 DIAGNOSIS — R921 Mammographic calcification found on diagnostic imaging of breast: Secondary | ICD-10-CM

## 2021-01-13 DIAGNOSIS — N6320 Unspecified lump in the left breast, unspecified quadrant: Secondary | ICD-10-CM

## 2021-01-13 DIAGNOSIS — N6310 Unspecified lump in the right breast, unspecified quadrant: Secondary | ICD-10-CM

## 2021-01-16 ENCOUNTER — Other Ambulatory Visit: Payer: Self-pay

## 2021-01-16 ENCOUNTER — Ambulatory Visit
Admission: RE | Admit: 2021-01-16 | Discharge: 2021-01-16 | Disposition: A | Discharge status: Home / Self Care | Payer: 59 | Source: Ambulatory Visit | Attending: Family Medicine | Admitting: Family Medicine

## 2021-01-16 DIAGNOSIS — Z170 Estrogen receptor positive status [ER+]: Secondary | ICD-10-CM | POA: Diagnosis not present

## 2021-01-16 DIAGNOSIS — N6313 Unspecified lump in the right breast, lower outer quadrant: Secondary | ICD-10-CM | POA: Diagnosis not present

## 2021-01-16 DIAGNOSIS — N6310 Unspecified lump in the right breast, unspecified quadrant: Secondary | ICD-10-CM

## 2021-01-16 DIAGNOSIS — C50811 Malignant neoplasm of overlapping sites of right female breast: Secondary | ICD-10-CM | POA: Diagnosis not present

## 2021-01-16 DIAGNOSIS — D241 Benign neoplasm of right breast: Secondary | ICD-10-CM | POA: Diagnosis not present

## 2021-01-18 ENCOUNTER — Telehealth: Payer: Self-pay | Admitting: Hematology and Oncology

## 2021-01-18 NOTE — Telephone Encounter (Signed)
Spoke to patient to confirm afternoon appointment for 4/27, packet emailed to patient

## 2021-01-20 ENCOUNTER — Ambulatory Visit
Admission: RE | Admit: 2021-01-20 | Discharge: 2021-01-20 | Disposition: A | Discharge status: Home / Self Care | Payer: 59 | Source: Ambulatory Visit | Attending: Family Medicine | Admitting: Family Medicine

## 2021-01-20 ENCOUNTER — Other Ambulatory Visit: Payer: Self-pay

## 2021-01-20 ENCOUNTER — Ambulatory Visit: Payer: 59

## 2021-01-20 DIAGNOSIS — C50511 Malignant neoplasm of lower-outer quadrant of right female breast: Secondary | ICD-10-CM | POA: Diagnosis not present

## 2021-01-20 DIAGNOSIS — N6320 Unspecified lump in the left breast, unspecified quadrant: Secondary | ICD-10-CM

## 2021-01-20 DIAGNOSIS — C50412 Malignant neoplasm of upper-outer quadrant of left female breast: Secondary | ICD-10-CM | POA: Diagnosis not present

## 2021-01-20 DIAGNOSIS — Z170 Estrogen receptor positive status [ER+]: Secondary | ICD-10-CM | POA: Diagnosis not present

## 2021-01-20 DIAGNOSIS — R921 Mammographic calcification found on diagnostic imaging of breast: Secondary | ICD-10-CM

## 2021-01-20 DIAGNOSIS — R928 Other abnormal and inconclusive findings on diagnostic imaging of breast: Secondary | ICD-10-CM | POA: Diagnosis not present

## 2021-01-20 DIAGNOSIS — Z809 Family history of malignant neoplasm, unspecified: Secondary | ICD-10-CM | POA: Diagnosis not present

## 2021-01-20 DIAGNOSIS — C50512 Malignant neoplasm of lower-outer quadrant of left female breast: Secondary | ICD-10-CM | POA: Diagnosis not present

## 2021-01-23 ENCOUNTER — Other Ambulatory Visit: Payer: Self-pay

## 2021-01-23 ENCOUNTER — Encounter: Payer: Self-pay | Admitting: *Deleted

## 2021-01-23 DIAGNOSIS — C50511 Malignant neoplasm of lower-outer quadrant of right female breast: Secondary | ICD-10-CM

## 2021-01-23 DIAGNOSIS — Z170 Estrogen receptor positive status [ER+]: Secondary | ICD-10-CM | POA: Insufficient documentation

## 2021-01-23 NOTE — Progress Notes (Signed)
[place]  [phone] [phone]     ID: Leslie Wright DOB: 02-13-1966  MR#: [id number]  CHE#:[id number]  Patient Care Team: Fanny Bien, MD as PCP - General (Family Medicine) Herminio Commons, MD (Inactive) as PCP - Cardiology (Cardiology) Mauro Kaufmann, RN as Oncology Nurse Navigator Rockwell Germany, RN as Oncology Nurse Navigator Stark Klein, MD as Consulting Physician (General Surgery) Kyung Rudd, MD as Consulting Physician (Radiation Oncology) Chauncey Cruel, MD OTHER MD:  CHIEF COMPLAINT: Bilateral breast cancers  CURRENT TREATMENT: Neoadjuvant chemotherapy   HISTORY OF CURRENT ILLNESS: Leslie Wright [address]" presented with a palpable left breast lump. She underwent bilateral diagnostic mammography with tomography and bilateral breast ultrasonography at [facility] on 01/12/2021 showing: breast density category C; 5.8 cm mass in the left breast at 12:30 with associated calcifications; additional 1.2 cm group of calcifications in the outer left breast;   On the right: Multiple small masses and irregular ducts in the retroareolar right breast, including a 0.8 cm representative mass at 6 o'clock; indeterminate 0.4 cm mass in upper-inner right breast; normal lymph nodes bilaterally.  Accordingly on 01/16/2021 she proceeded to biopsy of the right breast areas in question. The pathology from this procedure ([id number]) showed:  1. Right Breast, 6 o'clock  - invasive ductal carcinoma, grade 2. Prognostic indicators significant for: estrogen receptor, 90% positive with strong staining intensity and progesterone receptor, 0% negative. Proliferation marker Ki67 at 5%. HER2 equivocal by immunohistochemistry (2+), but negative by fluorescent in situ hybridization with a signals ratio 1.61 and number per cell 2.5. 2. Right Breast, 9 o'clock  - fibrocystic change with adenosis, calcifications, and a small intraductal papilloma.  She  underwent biopsy of the left breast areas on 01/20/2021. Pathology [id number]) revealed: 1. Left Breast, upper-outer quadrant  - invasive ductal carcinoma, grade 3 2. Left Breast, central, slightly lateral to midline  - ductal carcinoma in situ with necrosis and calcifications, intermediate grade Prognostic panels are pending on both samples.  Cancer Staging Malignant neoplasm of lower-outer quadrant of right breast of female, estrogen receptor positive ([place]) Staging form: Breast, AJCC 8th Edition - Clinical stage from 01/20/2021: Stage IA (cT1c, cN0, cM0, G2, ER+, PR-, HER2-) - Signed by Chauncey Cruel, MD on 01/25/2021 Stage prefix: Initial diagnosis Histologic grading system: 3 grade system  Malignant neoplasm of overlapping sites of left breast in female, estrogen receptor negative ([place]) Staging form: Breast, AJCC 8th Edition - Clinical stage from 01/20/2021: Stage IIIB (cT3, cN0, cM0, G3, ER-, PR-, HER2-) - Signed by Chauncey Cruel, MD on 01/25/2021 Histologic grading system: 3 grade system   The patient's subsequent history is as detailed below.   INTERVAL HISTORY: Leslie "Zigmund Daniel" was evaluated in the breast cancer clinic on 01/24/2021 accompanied by her niece Lovena Le. Her case was also presented at the multidisciplinary breast cancer conference on 01/26/2019. At that time a preliminary plan was proposed: Neoadjuvant chemotherapy, definitive surgery, bilateral radiation, genetics  She is scheduled for breast MRI on 02/01/2021.  Of note, she is also scheduled for bone density screening on 02/08/2021.   REVIEW OF SYSTEMS: The patient denies unusual headaches, visual changes, nausea, vomiting, stiff neck, dizziness, or gait imbalance. There has been no cough, phlegm production, or pleurisy, no chest pain or pressure, and no change in bowel or bladder habits. The patient denies fever, rash, bleeding, unexplained fatigue or unexplained weight loss. A detailed review of systems was  otherwise entirely negative.   COVID 19  VACCINATION STATUS: Status post March ARB x2, no booster as of 01/31/21   PAST MEDICAL HISTORY: Past Medical History:  Diagnosis Date  . Acid reflux   . Anemia   . Arthritis    "maybe in my fingers" (03/26/2018)  . Family history of adverse reaction to anesthesia    "brother, Otila Kluver, and my mom always get sick" (03/26/2018)  . Gallbladder problem   . High blood pressure   . High cholesterol   . Hip pain    "left; before OR" (03/26/2018)  . History of kidney stones   . OSA (obstructive sleep apnea)    "can't tolerate the mask" (03/26/2018)  . Pre-diabetes   . Shortness of breath   . Swelling of lower extremity    "that's why they put me on fluid pill" (03/26/2018)    PAST SURGICAL HISTORY: Past Surgical History:  Procedure Laterality Date  . CARPAL TUNNEL RELEASE Right ~ 2006  . EYE SURGERY    . JOINT REPLACEMENT    . LAPAROSCOPIC CHOLECYSTECTOMY  2011-02-01  . LASIK Bilateral 2000  . TOTAL HIP ARTHROPLASTY Left 03/25/2018   Procedure: LEFT TOTAL HIP ARTHROPLASTY ANTERIOR APPROACH;  Surgeon: Mcarthur Rossetti, MD;  Location: [facility];  Service: Orthopedics;  Laterality: Left;    FAMILY HISTORY: Family History  Problem Relation Age of Onset  . Diabetes Mother   . Thyroid disease Mother   . Liver disease Mother   . Obesity Mother   . Hypertension Father   . Hyperlipidemia Father   . Heart disease Father   . Stroke Father   . Obesity Father   . Colon polyps Father   . Prostate cancer Brother   . Colon cancer Neg Hx        No one in family with colon cancer.  The patient's father is 24 years old as of 31-Jan-2021.  The patient's mother died from cirrhosis associated with hepatitis C (from a transfusion) age 52.  The patient has 3 brothers, no sisters.  1 brother has a history of prostate cancer at age 83.  A maternal grandfather had a history of central nervous system carcinoma.  A paternal grandmother had a history of pancreatic  cancer and a maternal aunt had a history of breast cancer in her late 4s.   GYNECOLOGIC HISTORY:  No LMP recorded. (Menstrual status: Perimenopausal). Menarche: 55 years old [place] P 0 LMP 53 HRT no  Hysterectomy? no BSO? no   SOCIAL HISTORY: (updated 01-31-21)  Rosaria Ferries "Zigmund Daniel" works for [employer] in [employer].  She works from home.  She is a Engineer, mining.  She describes herself as single, lives by herself, with a toy poodle and a [place] as well as 2 cats.  ADVANCED DIRECTIVES: The patient's brother Cassidey Barrales is her healthcare power of attorney.  He can be reached at [phone]   HEALTH MAINTENANCE: Social History   Tobacco Use  . Smoking status: Never Smoker  . Smokeless tobacco: Never Used  Vaping Use  . Vaping Use: Never used  Substance Use Topics  . Alcohol use: Not Currently  . Drug use: Never     Colonoscopy:   PAP: 03/2011  Bone density: scheduled for 01/2021   No Known Allergies  Current Outpatient Medications  Medication Sig Dispense Refill  . acetaminophen (TYLENOL) 650 MG CR tablet Take 650 mg by mouth See admin instructions. TAKE 1 TABLET (650 MG) BY MOUTH SCHEDULED IN THE MORNING, MAY REPEAT DOSE IN THE EVENING IF NEEDED FOR  PAIN.    . aspirin EC 81 MG tablet Take 81 mg by mouth daily.    . Blood Glucose Monitoring Suppl (FREESTYLE LITE) w/Device KIT CHECK GLUCOSE 2-3 TIMES DAILY 1 kit 0  . Cyanocobalamin (B-12) 2500 MCG TABS Take 2,500 mcg by mouth daily.    Marland Kitchen glucose blood test strip USE AS DIRECTED TO CHECK BLOOD SUGAR 1 TO 3 TIMES A DAY (Patient taking differently: USE AS DIRECTED TO CHECK BLOOD SUGAR 1 TO 3 TIMES A DAY) 100 strip 3  . ketoconazole (NIZORAL) 2 % cream Apply 1 application topically daily. 15 g 0  . Lancets (FREESTYLE) lancets USE AS DIRECTED TO CHECK BLOOD SUGAR 2 TO 3 TIMES A DAY (Patient taking differently: USE AS DIRECTED TO CHECK BLOOD SUGAR 2 TO 3 TIMES A DAY) 100 each 3  . losartan (COZAAR) 50 MG tablet TAKE 1 TABLET BY  MOUTH ONCE A DAY FOR HIGH BLOOD PRESSURE 90 tablet 0  . rosuvastatin (CRESTOR) 10 MG tablet TAKE 1 TABLET BY MOUTH ONCE A DAY AT BEDTIME FOR CHOLESTEROL 90 tablet 1   No current facility-administered medications for this visit.    OBJECTIVE: White woman who appears stated age  83:   01/24/21 1627  BP: 137/82  Pulse: 97  Resp: 18  Temp: 97.7 F (36.5 C)  SpO2: 97%     Body mass index is 48.77 kg/m.   Wt Readings from Last 3 Encounters:  01/24/21 (!) 311 lb 6.4 oz (141.3 kg)  02/25/19 295 lb (133.8 kg)  12/02/18 280 lb (127 kg)      ECOG FS:1 - Symptomatic but completely ambulatory  Ocular: Sclerae unicteric, pupils round and equal Ear-nose-throat: Wearing a mask Lymphatic: No cervical or supraclavicular adenopathy Lungs no rales or rhonchi Heart regular rate and rhythm Abd soft, obese, nontender, positive bowel sounds MSK no focal spinal tenderness, no joint edema Neuro: non-focal, well-oriented, appropriate affect Breasts: I do not palpate a mass in the right breast.  In the left breast there is an easily palpable mass in the upper outer quadrant which is movable, with no skin or nipple involvement.  Both axillae are benign.   LAB RESULTS:  CMP     Component Value Date/Time   NA 143 01/24/2021 1611   NA 143 12/02/2018 1138   K 3.9 01/24/2021 1611   CL 107 01/24/2021 1611   CO2 25 01/24/2021 1611   GLUCOSE 136 (H) 01/24/2021 1611   BUN 13 01/24/2021 1611   BUN 17 12/02/2018 1138   CREATININE 1.32 (H) 01/24/2021 1611   CREATININE 1.09 (H) 02/18/2019 1524   CALCIUM 9.4 01/24/2021 1611   PROT 7.3 01/24/2021 1611   PROT 6.9 12/02/2018 1138   ALBUMIN 3.6 01/24/2021 1611   ALBUMIN 4.0 12/02/2018 1138   AST 14 (L) 01/24/2021 1611   ALT 17 01/24/2021 1611   ALKPHOS 79 01/24/2021 1611   BILITOT 0.2 (L) 01/24/2021 1611   GFRNONAA 47 (L) 01/24/2021 1611   GFRAA 70 12/02/2018 1138    No results found for: TOTALPROTELP, ALBUMINELP, A1GS, A2GS, BETS, BETA2SER,  GAMS, MSPIKE, SPEI  Lab Results  Component Value Date   WBC 8.6 01/24/2021   NEUTROABS 5.1 01/24/2021   HGB 13.0 01/24/2021   HCT 40.0 01/24/2021   MCV 86.4 01/24/2021   PLT 235 01/24/2021    No results found for: LABCA2  No components found for: [id number]  No results for input(s): INR in the last 168 hours.  No results found for: LABCA2  No results found for: CAN199  No results found for: WIO973  No results found for: ZHG992  No results found for: CA2729  No components found for: HGQUANT  No results found for: CEA1 / No results found for: CEA1   No results found for: AFPTUMOR  No results found for: CHROMOGRNA  No results found for: KPAFRELGTCHN, LAMBDASER, KAPLAMBRATIO (kappa/lambda light chains)  No results found for: HGBA, HGBA2QUANT, HGBFQUANT, HGBSQUAN (Hemoglobinopathy evaluation)   No results found for: LDH  No results found for: IRON, TIBC, IRONPCTSAT (Iron and TIBC)  No results found for: FERRITIN  Urinalysis    Component Value Date/Time   COLORURINE YELLOW 10/23/2010 2031   APPEARANCEUR CLEAR 10/23/2010 2031   LABSPEC 1.025 10/23/2010 2031   PHURINE 6.0 10/23/2010 2031   GLUCOSEU NEGATIVE 03/12/2009 0843   HGBUR MODERATE (A) 10/23/2010 2031   BILIRUBINUR NEGATIVE 10/23/2010 2031   KETONESUR NEGATIVE 10/23/2010 2031   PROTEINUR TRACE (A) 10/23/2010 2031   UROBILINOGEN 0.2 10/23/2010 2031   NITRITE NEGATIVE 10/23/2010 2031   LEUKOCYTESUR NEGATIVE 10/23/2010 2031     STUDIES: US BREAST LTD UNI LEFT INC AXILLA  Result Date: 01/12/2021 CLINICAL DATA:  55 year old female presenting with a new lump in the left breast. EXAM: DIGITAL DIAGNOSTIC BILATERAL MAMMOGRAM WITH TOMOSYNTHESIS AND CAD; ULTRASOUND RIGHT BREAST LIMITED; ULTRASOUND LEFT BREAST LIMITED TECHNIQUE: Bilateral digital diagnostic mammography and breast tomosynthesis was performed. The images were evaluated with computer-aided detection.; Targeted ultrasound examination of the right  breast was performed; Targeted ultrasound examination of the left breast was performed COMPARISON:  Previous exam(s). ACR Breast Density Category c: The breast tissue is heterogeneously dense, which may obscure small masses. FINDINGS: Mammogram: Right breast: Spot compression tomosynthesis views were performed in addition to standard views. There is distortion in the retroareolar inferior right breast which persists on the spot imaging. There is a small mass in the upper inner right breast posterior depth measuring approximately 0.4 cm. The mass spans approximately 5.9 cm. Left breast: A skin BB marks the palpable site of concern reported by the patient in the upper outer left breast a spot tangential view of this area was performed in addition to standard views demonstrating an irregular spiculated mass with associated pleomorphic calcifications. Spot 2D magnification views were performed for a group of calcifications in the outer left breast middle depth spanning approximately 1.2 cm. On physical exam of the left breast I feel a fixed discrete mass at the site of concern reported by the patient in the upper outer quadrant. Ultrasound: Right breast: Targeted ultrasound performed in the right breast at 1 o'clock 7 cm from the nipple demonstrating an irregular hypoechoic mass measuring 0.5 x 0.3 x 0.3 cm. This corresponds to the mammographic finding. In the inferior retroareolar aspect of the breast there are multiple small hypoechoic masses and irregular mildly dilated ducts. A representative mass at 6 o'clock retroareolar measures 0.8 x 0.6 x 0.4 cm. There is internal vascularity. This may correspond to the area of distortion identified mammographically. Targeted ultrasound of the right axilla demonstrates normal lymph nodes. Left breast: Targeted ultrasound is performed in the left breast at the palpable site of concern at 12:30 o'clock 11 cm from the nipple demonstrating a large irregular hypoechoic mass  measuring at least 5.8 x 3.0 x 3.5 cm. Targeted ultrasound of the left axilla demonstrates normal lymph nodes. IMPRESSION: 1. Highly suspicious mass with associated calcifications in the left breast at 12:30 o'clock measuring at least 5.8 cm. 2. Additional suspicious group of calcifications  in the outer left breast measuring 1.2 cm. 3. Multiple small masses and irregular ducts in the retroareolar right breast. A representative mass at retroareolar 6 o'clock measures 0.8 cm. This may correspond to the distortion identified mammographically. 4. Indeterminate small mass in the upper inner right breast measuring 0.4 cm. 5.  No axillary adenopathy. RECOMMENDATION: 1. Ultrasound-guided core needle biopsy of the right breast mass at 6 o'clock retroareolar and of the right breast mass at 1 o'clock. 2. Ultrasound-guided core needle biopsy of the left breast mass at 12:30 o'clock. 3. Stereotactic core needle biopsy of the calcifications in the outer left breast. Given the multiplicity of findings bilaterally, MRI is recommended following the above biopsies. I have discussed the findings and recommendations with the patient who agrees to proceed with biopsy. The patient will be scheduled for the biopsy appointment prior to leaving the office today. BI-RADS CATEGORY  5: Highly suggestive of malignancy. Electronically Signed   By: Audie Pinto M.D.   On: 01/12/2021 16:05   US BREAST LTD UNI RIGHT INC AXILLA  Result Date: 01/12/2021 CLINICAL DATA:  55 year old female presenting with a new lump in the left breast. EXAM: DIGITAL DIAGNOSTIC BILATERAL MAMMOGRAM WITH TOMOSYNTHESIS AND CAD; ULTRASOUND RIGHT BREAST LIMITED; ULTRASOUND LEFT BREAST LIMITED TECHNIQUE: Bilateral digital diagnostic mammography and breast tomosynthesis was performed. The images were evaluated with computer-aided detection.; Targeted ultrasound examination of the right breast was performed; Targeted ultrasound examination of the left breast was  performed COMPARISON:  Previous exam(s). ACR Breast Density Category c: The breast tissue is heterogeneously dense, which may obscure small masses. FINDINGS: Mammogram: Right breast: Spot compression tomosynthesis views were performed in addition to standard views. There is distortion in the retroareolar inferior right breast which persists on the spot imaging. There is a small mass in the upper inner right breast posterior depth measuring approximately 0.4 cm. The mass spans approximately 5.9 cm. Left breast: A skin BB marks the palpable site of concern reported by the patient in the upper outer left breast a spot tangential view of this area was performed in addition to standard views demonstrating an irregular spiculated mass with associated pleomorphic calcifications. Spot 2D magnification views were performed for a group of calcifications in the outer left breast middle depth spanning approximately 1.2 cm. On physical exam of the left breast I feel a fixed discrete mass at the site of concern reported by the patient in the upper outer quadrant. Ultrasound: Right breast: Targeted ultrasound performed in the right breast at 1 o'clock 7 cm from the nipple demonstrating an irregular hypoechoic mass measuring 0.5 x 0.3 x 0.3 cm. This corresponds to the mammographic finding. In the inferior retroareolar aspect of the breast there are multiple small hypoechoic masses and irregular mildly dilated ducts. A representative mass at 6 o'clock retroareolar measures 0.8 x 0.6 x 0.4 cm. There is internal vascularity. This may correspond to the area of distortion identified mammographically. Targeted ultrasound of the right axilla demonstrates normal lymph nodes. Left breast: Targeted ultrasound is performed in the left breast at the palpable site of concern at 12:30 o'clock 11 cm from the nipple demonstrating a large irregular hypoechoic mass measuring at least 5.8 x 3.0 x 3.5 cm. Targeted ultrasound of the left axilla  demonstrates normal lymph nodes. IMPRESSION: 1. Highly suspicious mass with associated calcifications in the left breast at 12:30 o'clock measuring at least 5.8 cm. 2. Additional suspicious group of calcifications in the outer left breast measuring 1.2 cm. 3. Multiple small masses and  irregular ducts in the retroareolar right breast. A representative mass at retroareolar 6 o'clock measures 0.8 cm. This may correspond to the distortion identified mammographically. 4. Indeterminate small mass in the upper inner right breast measuring 0.4 cm. 5.  No axillary adenopathy. RECOMMENDATION: 1. Ultrasound-guided core needle biopsy of the right breast mass at 6 o'clock retroareolar and of the right breast mass at 1 o'clock. 2. Ultrasound-guided core needle biopsy of the left breast mass at 12:30 o'clock. 3. Stereotactic core needle biopsy of the calcifications in the outer left breast. Given the multiplicity of findings bilaterally, MRI is recommended following the above biopsies. I have discussed the findings and recommendations with the patient who agrees to proceed with biopsy. The patient will be scheduled for the biopsy appointment prior to leaving the office today. BI-RADS CATEGORY  5: Highly suggestive of malignancy. Electronically Signed   By: Audie Pinto M.D.   On: 01/12/2021 16:05   MM DIAG BREAST TOMO BILATERAL  Result Date: 01/12/2021 CLINICAL DATA:  55 year old female presenting with a new lump in the left breast. EXAM: DIGITAL DIAGNOSTIC BILATERAL MAMMOGRAM WITH TOMOSYNTHESIS AND CAD; ULTRASOUND RIGHT BREAST LIMITED; ULTRASOUND LEFT BREAST LIMITED TECHNIQUE: Bilateral digital diagnostic mammography and breast tomosynthesis was performed. The images were evaluated with computer-aided detection.; Targeted ultrasound examination of the right breast was performed; Targeted ultrasound examination of the left breast was performed COMPARISON:  Previous exam(s). ACR Breast Density Category c: The breast tissue  is heterogeneously dense, which may obscure small masses. FINDINGS: Mammogram: Right breast: Spot compression tomosynthesis views were performed in addition to standard views. There is distortion in the retroareolar inferior right breast which persists on the spot imaging. There is a small mass in the upper inner right breast posterior depth measuring approximately 0.4 cm. The mass spans approximately 5.9 cm. Left breast: A skin BB marks the palpable site of concern reported by the patient in the upper outer left breast a spot tangential view of this area was performed in addition to standard views demonstrating an irregular spiculated mass with associated pleomorphic calcifications. Spot 2D magnification views were performed for a group of calcifications in the outer left breast middle depth spanning approximately 1.2 cm. On physical exam of the left breast I feel a fixed discrete mass at the site of concern reported by the patient in the upper outer quadrant. Ultrasound: Right breast: Targeted ultrasound performed in the right breast at 1 o'clock 7 cm from the nipple demonstrating an irregular hypoechoic mass measuring 0.5 x 0.3 x 0.3 cm. This corresponds to the mammographic finding. In the inferior retroareolar aspect of the breast there are multiple small hypoechoic masses and irregular mildly dilated ducts. A representative mass at 6 o'clock retroareolar measures 0.8 x 0.6 x 0.4 cm. There is internal vascularity. This may correspond to the area of distortion identified mammographically. Targeted ultrasound of the right axilla demonstrates normal lymph nodes. Left breast: Targeted ultrasound is performed in the left breast at the palpable site of concern at 12:30 o'clock 11 cm from the nipple demonstrating a large irregular hypoechoic mass measuring at least 5.8 x 3.0 x 3.5 cm. Targeted ultrasound of the left axilla demonstrates normal lymph nodes. IMPRESSION: 1. Highly suspicious mass with associated  calcifications in the left breast at 12:30 o'clock measuring at least 5.8 cm. 2. Additional suspicious group of calcifications in the outer left breast measuring 1.2 cm. 3. Multiple small masses and irregular ducts in the retroareolar right breast. A representative mass at retroareolar 6 o'clock measures  0.8 cm. This may correspond to the distortion identified mammographically. 4. Indeterminate small mass in the upper inner right breast measuring 0.4 cm. 5.  No axillary adenopathy. RECOMMENDATION: 1. Ultrasound-guided core needle biopsy of the right breast mass at 6 o'clock retroareolar and of the right breast mass at 1 o'clock. 2. Ultrasound-guided core needle biopsy of the left breast mass at 12:30 o'clock. 3. Stereotactic core needle biopsy of the calcifications in the outer left breast. Given the multiplicity of findings bilaterally, MRI is recommended following the above biopsies. I have discussed the findings and recommendations with the patient who agrees to proceed with biopsy. The patient will be scheduled for the biopsy appointment prior to leaving the office today. BI-RADS CATEGORY  5: Highly suggestive of malignancy. Electronically Signed   By: Audie Pinto M.D.   On: 01/12/2021 16:05   MM CLIP PLACEMENT LEFT  Result Date: 01/20/2021 CLINICAL DATA:  Evaluate post biopsy marker clip placements following ultrasound-guided core needle biopsy of a left breast mass and stereotactic core needle biopsy of left breast calcifications. EXAM: DIAGNOSTIC LEFT MAMMOGRAM POST ULTRASOUND AND STEREOTACTIC BIOPSY COMPARISON:  Previous exam(s). FINDINGS: Mammographic images were obtained following ultrasound guided and stereotactic guided biopsy of the left breast. The biopsy marking clips are in expected position at the site of biopsy. The ribbon shaped biopsy clip lies within lateral aspect the left breast mass. The coil shaped biopsy clip lies in the expected location the lateral calcifications. IMPRESSION:  Appropriate positioning of the coil and ribbon shaped biopsy marking clips at the site of biopsy in the left breast as detailed above. Final Assessment: Post Procedure Mammograms for Marker Placement Electronically Signed   By: Lajean Manes M.D.   On: 01/20/2021 10:19   MM CLIP PLACEMENT RIGHT  Addendum Date: 01/16/2021   ADDENDUM REPORT: 01/16/2021 10:23 ADDENDUM: Of note, the mass in the right breast at 6:00, 1 cmfn with the ribbon shaped biopsy marking clip does correspond with the area of distortion identified mammographically. Electronically Signed   By: Ammie Ferrier M.D.   On: 01/16/2021 10:23   Result Date: 01/16/2021 CLINICAL DATA:  Post biopsy mammogram of the right breast for clip placement. EXAM: DIAGNOSTIC RIGHT MAMMOGRAM POST ULTRASOUND BIOPSY COMPARISON:  Previous exam(s). FINDINGS: Mammographic images were obtained following ultrasound guided biopsy of 2 sites in the right breast. The biopsy marking clips are in expected position at the site of biopsy. The clips lie 3.2 cm apart. The small mass in the upper inner posterior right breast is again seen, but was not biopsied today. IMPRESSION: 1. Appropriate positioning of the ribbon shaped biopsy marking clip at the site of biopsy in the 6 o'clock position in the retroareolar right breast. 2. Appropriate positioning of the coil shaped biopsy marking clip at the site of biopsy in the right breast at the 9 o'clock position in the anterior right breast. 3.  The 2 biopsy marking clips are approximately 3.2 cm apart. 4. The small mass in the upper inner quadrant, posterior depth is again seen on this mammogram, but was not biopsied today. See more detailed information in the body of the ultrasound-guided biopsy report. Recommendations regarding this mass will be determined following the biopsy results. Final Assessment: Post Procedure Mammograms for Marker Placement Electronically Signed: By: Ammie Ferrier M.D. 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 This was found to be concordant by Dr. Lajean Manes. 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 This was found to be concordant by Dr. Lajean Manes. Pathology results were discussed with the patient by telephone. The patient reported doing well after the biopsies with minimal tenderness and bleeding at the sites. Post biopsy instructions and care were reviewed and questions were answered. The patient was encouraged to call [facility] for any additional concerns. My direct phone number was provided. The patient has a recent diagnosis of RIGHT breast cancer and should follow her outlined treatment plan. The patient is scheduled for a BILATERAL breast MRI on Feb 01, 2021. Pathology results reported by Terie Purser, RN on 01/24/2021. Electronically Signed   By: Lajean Manes M.D.   On: 01/24/2021 14:14   Result Date: 01/24/2021 CLINICAL DATA:  Patient presents for stereotactic core needle biopsy of left breast calcifications. Procedure followed ultrasound-guided core needle biopsy a palpable left breast mass. Patient underwent ultrasound-guided core needle biopsy of 2 right breast masses on 01/16/2021, 1 revealing invasive mammary carcinoma. EXAM: LEFT BREAST STEREOTACTIC CORE NEEDLE BIOPSY COMPARISON:  Previous exams. FINDINGS: The patient and I discussed the procedure of stereotactic-guided biopsy including benefits and alternatives. We discussed the high likelihood of a successful procedure. We discussed the risks of the procedure including infection, bleeding, tissue injury, clip migration, and inadequate sampling. Informed written consent was given.  The usual time out protocol was performed immediately prior to the procedure. Using sterile technique and 1% Lidocaine as local anesthetic, under stereotactic guidance, a 9 gauge vacuum assisted device was used to perform core needle biopsy of calcifications in the central left breast slightly towards the upper-outer quadrant using a lateral approach. Specimen radiograph was performed showing multiple calcifications for which biopsy was performed. Specimens with calcifications are identified for pathology. Lesion quadrant: Upper outer quadrant At the conclusion of the procedure, coil shaped tissue marker clip was deployed into the biopsy cavity. Follow-up 2-view mammogram was performed and dictated separately. IMPRESSION: Stereotactic-guided biopsy of left breast calcifications. No apparent complications. Electronically Signed: By: Lajean Manes M.D. 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 This was found to be concordant by Dr. Lajean Manes. 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 This was found to be concordant by Dr. Lajean Manes. Pathology results were discussed with the patient by telephone. The patient reported doing well after the biopsies with minimal tenderness and bleeding at the sites. Post biopsy instructions and care were reviewed and questions were answered. The patient was encouraged to call [facility] for any additional concerns. My direct phone number was provided. The patient has a recent diagnosis of RIGHT breast cancer and should follow her outlined treatment plan. The patient is scheduled for a BILATERAL breast MRI on Feb 01, 2021. Pathology  results reported by Terie Purser, RN on 01/24/2021. Electronically Signed   By: Lajean Manes M.D.   On: 01/24/2021 14:14   Result Date: 01/24/2021 CLINICAL DATA:  Patient presents for an ultrasound-guided core needle biopsy  a palpable left breast mass. She has undergone biopsy 2 right breast lesions on 01/16/2021, 1 of which revealed grade 2 invasive ductal carcinoma. Following today's ultrasound-guided core needle biopsy, the patient is to have a stereotactic core needle biopsy of left breast calcifications near the palpable left breast mass. EXAM: ULTRASOUND GUIDED LEFT BREAST CORE NEEDLE BIOPSY COMPARISON:  Previous exam(s). PROCEDURE: I met with the patient and we discussed the procedure of ultrasound-guided biopsy, including benefits and alternatives. We discussed the high likelihood of a successful procedure. We discussed the risks of the procedure, including infection, bleeding, tissue injury, clip migration, and inadequate sampling. Informed written consent was given. The usual time-out protocol was performed immediately prior to the procedure. Lesion quadrant: Lower outer quadrant Using sterile technique and 1% Lidocaine as local anesthetic, under direct ultrasound visualization, a 12 gauge spring-loaded device was used to perform biopsy of the palpable, 5.9 cm breast mass using an inferior approach. At the conclusion of the procedure a ribbon shaped tissue marker clip was deployed into the biopsy cavity. Follow up 2 view mammogram was performed and dictated separately. IMPRESSION: Ultrasound guided biopsy of a left breast mass. No apparent complications. Electronically Signed: By: Lajean Manes M.D. On: 01/20/2021 09:27   [place] RT BREAST BX W LOC DEV 1ST LESION IMG BX SPEC US GUIDE  Addendum Date: 01/17/2021   ADDENDUM REPORT: 01/17/2021 13:58 ADDENDUM: Pathology revealed GRADE II INVASIVE DUCTAL CARCINOMA, CALCIFICATIONS of the RIGHT breast, 6 o'clock, 1cmfn. This was found to be concordant by Dr.  Ammie Ferrier. Pathology revealed FIBROCYSTIC CHANGE WITH ADENOSIS, CALCIFICATIONS AND A SMALL INTRADUCTAL PAPILLOMA of the RIGHT breast, 9 o'clock, 1cmfn. This was found to be discordant by Dr. Ammie Ferrier, with excision recommended. Pathology results were discussed with the patient by telephone. The patient reported doing well after the biopsies with tenderness at the sites. Post biopsy instructions and care were reviewed and questions were answered. The patient was encouraged to call [facility] for any additional concerns. My direct phone number was provided. The patient was referred to [facility] on January 25, 2021. Recommendation for a bilateral breast MRI to evaluat for any additional sites of disease given the multiplicity of findings and heterogeneously dense breasts. The patient is scheduled for a LEFT breast stereotatic guided biopsy and a LEFT breast ultrasound guided biopsy at [facility] on January 20, 2021. Further recommendations will be guided by the results of these biopsies. NOTE: The small mass in the upper inner quadrant of the RIGHT breast at 1:00 o'clock, posterior depth, has not been biopsied. This may be biopsied using stereotatic guidance, after the MRI and LEFT breast biopsies have been performed, if this will alter treatment. Pathology results reported by Terie Purser, RN on 01/17/2021. Electronically Signed   By: Ammie Ferrier M.D.   On: 01/17/2021 13:58   Result Date: 01/17/2021 CLINICAL DATA:  55 year old female presenting for ultrasound-guided biopsy right breast. EXAM: ULTRASOUND GUIDED RIGHT BREAST CORE NEEDLE BIOPSY COMPARISON:  Previous exam(s). PROCEDURE: I met with the patient and we discussed the procedure of ultrasound-guided biopsy, including benefits and alternatives. We discussed the high likelihood of a successful procedure. We discussed the risks  of the procedure, including infection, bleeding, tissue injury, clip migration, and inadequate sampling. Informed written consent was given. The usual time-out protocol was performed immediately prior to the procedure. ----------------------------------------------------------------------------- In preparation for ultrasound-guided biopsy, the breast was again  imaged sonographically. In the right breast at 1 o'clock, there were a few irregular near anechoic masses, similar appearance to that identified on the diagnostic ultrasound. It was not clear which of these corresponded with the mass seen mammographically. Therefore I did not biopsy this site as other more concerning findings were seen with additional scanning. In the right breast at 6 o'clock, 1 cm from the nipple, there is a highly suspicious irregular hypoechoic shadowing mass with indistinct and distorted margins measuring 1.4 x 0.8 x 1.0 cm. This is different from the retroareolar mass at 6 o'clock found on the diagnostic ultrasound. In the lower outer to retroareolar right breast, multiple prominent branching beaded ducts are identified, spanning at least 4 cm. The mass identified on the initial diagnostic ultrasound at 6 o'clock in the retroareolar right breast was identified, and has an appearance suggesting that it is part of this same process. Therefore, I selected a nodular portion of a duct at 9 o'clock, 1 cm from the nipple in order to biopsy something slightly more distant from the mass I found at 6 o'clock, 1 cm from the nipple. -------------------------------------------------------------------------------- Lesion quadrant: Lower outer quadrant Using sterile technique and 1% Lidocaine as local anesthetic, under direct ultrasound visualization, a 14 gauge spring-loaded device was used to perform biopsy of a mass in the right breast at 6 o'clock, 1 cm from the nipple using an inferior approach. At the conclusion of the procedure a ribbon shaped  tissue marker clip was deployed into the biopsy cavity. Lesion quadrant: Lower outer quadrant Using sterile technique and 1% Lidocaine as local anesthetic, under direct ultrasound visualization, a 14 gauge spring-loaded device was used to perform biopsy of a nodular portion of a duct in the right breast at 9 o'clock, 1 cm from the nipple using a in inferior approach. At the conclusion of the procedure a coil shaped tissue marker clip was deployed into the biopsy cavity. Follow up 2 view mammogram was performed and dictated separately. IMPRESSION: 1. Ultrasound guided biopsy of a right breast mass at 6 o'clock, 1 cm from the nipple. No apparent complications. 2. Ultrasound-guided biopsy of a right breast nodular portion of the duct at 9 o'clock, 1 cm from the nipple. No apparent complications. Electronically Signed: By: Ammie Ferrier M.D. On: 01/16/2021 10:01   [place] RT BREAST BX W LOC DEV EA ADD LESION IMG BX SPEC US GUIDE  Addendum Date: 01/17/2021   ADDENDUM REPORT: 01/17/2021 13:58 ADDENDUM: Pathology revealed GRADE II INVASIVE DUCTAL CARCINOMA, CALCIFICATIONS of the RIGHT breast, 6 o'clock, 1cmfn. This was found to be concordant by Dr. Ammie Ferrier. Pathology revealed FIBROCYSTIC CHANGE WITH ADENOSIS, CALCIFICATIONS AND A SMALL INTRADUCTAL PAPILLOMA of the RIGHT breast, 9 o'clock, 1cmfn. This was found to be discordant by Dr. Ammie Ferrier, with excision recommended. Pathology results were discussed with the patient by telephone. The patient reported doing well after the biopsies with tenderness at the sites. Post biopsy instructions and care were reviewed and questions were answered. The patient was encouraged to call [facility] for any additional concerns. My direct phone number was provided. The patient was referred to [facility] on January 25, 2021. Recommendation for a bilateral breast MRI to  evaluat for any additional sites of disease given the multiplicity of findings and heterogeneously dense breasts. The patient is scheduled for a LEFT breast stereotatic guided biopsy and a LEFT breast ultrasound guided [facility]. Further recommendations will be guided by the results of these biopsies. NOTE: The small mass in the upper inner quadrant of the RIGHT breast at 1:00 o'clock, posterior depth, has not been biopsied. This may be biopsied using stereotatic guidance, after the MRI and LEFT breast biopsies have been performed, if this will alter treatment. Pathology results reported by Terie Purser, RN on 01/17/2021. Electronically Signed   By: Ammie Ferrier M.D.   On: 01/17/2021 13:58   Result Date: 01/17/2021 CLINICAL DATA:  55 year old female presenting for ultrasound-guided biopsy right breast. EXAM: ULTRASOUND GUIDED RIGHT BREAST CORE NEEDLE BIOPSY COMPARISON:  Previous exam(s). PROCEDURE: I met with the patient and we discussed the procedure of ultrasound-guided biopsy, including benefits and alternatives. We discussed the high likelihood of a successful procedure. We discussed the risks of the procedure, including infection, bleeding, tissue injury, clip migration, and inadequate sampling. Informed written consent was given. The usual time-out protocol was performed immediately prior to the procedure. ----------------------------------------------------------------------------- In preparation for ultrasound-guided biopsy, the breast was again imaged sonographically. In the right breast at 1 o'clock, there were a few irregular near anechoic masses, similar appearance to that identified on the diagnostic ultrasound. It was not clear which of these corresponded with the mass seen mammographically. Therefore I did not biopsy this site as other more concerning findings were seen with additional scanning. In the right breast at 6 o'clock, 1 cm from the nipple,  there is a highly suspicious irregular hypoechoic shadowing mass with indistinct and distorted margins measuring 1.4 x 0.8 x 1.0 cm. This is different from the retroareolar mass at 6 o'clock found on the diagnostic ultrasound. In the lower outer to retroareolar right breast, multiple prominent branching beaded ducts are identified, spanning at least 4 cm. The mass identified on the initial diagnostic ultrasound at 6 o'clock in the retroareolar right breast was identified, and has an appearance suggesting that it is part of this same process. Therefore, I selected a nodular portion of a duct at 9 o'clock, 1 cm from the nipple in order to biopsy something slightly more distant from the mass I found at 6 o'clock, 1 cm from the nipple. -------------------------------------------------------------------------------- Lesion quadrant: Lower outer quadrant Using sterile technique and 1% Lidocaine as local anesthetic, under direct ultrasound visualization, a 14 gauge spring-loaded device was used to perform biopsy of a mass in the right breast at 6 o'clock, 1 cm from the nipple using an inferior approach. At the conclusion of the procedure a ribbon shaped tissue marker clip was deployed into the biopsy cavity. Lesion quadrant: Lower outer quadrant Using sterile technique and 1% Lidocaine as local anesthetic, under direct ultrasound visualization, a 14 gauge spring-loaded device was used to perform biopsy of a nodular portion of a duct in the right breast at 9 o'clock, 1 cm from the nipple using a in inferior approach. At the conclusion of the procedure a coil shaped tissue marker clip was deployed into the biopsy cavity. Follow up 2 view mammogram was performed and dictated separately. IMPRESSION: 1. Ultrasound guided biopsy of a right breast mass at 6 o'clock, 1 cm from the nipple. No apparent complications. 2. Ultrasound-guided biopsy of a right breast nodular portion of the duct at 9 o'clock, 1 cm from the nipple. No  apparent complications. Electronically Signed: By: Ammie Ferrier M.D. On: 01/16/2021 10:01     ELIGIBLE FOR AVAILABLE RESEARCH PROTOCOL:   ASSESSMENT: 55 y.o. 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  (3) neoadjuvant chemotherapy will start 02/09/2021 consisting of pembrolizumab/carboplatin/paclitaxel followed by pembrolizumab/doxorubicin/cyclophosphamide  (4) definitive surgery to follow  (5) adjuvant radiation to bilateral sites  (6) genetics testing    PLAN: I met today with Jazlynn to review her new diagnosis. Specifically we discussed the biology of her breast cancer, its diagnosis, staging, treatment  options and prognosis. We first reviewed the fact that cancer is not one disease but more than 100 different diseases and that it is important to keep them separate-- otherwise when friends and relatives discuss their own cancer experiences with [place] confusion can result. Similarly we explained that if breast cancer spreads to the bone or liver, the patient would not have bone cancer or liver cancer, but breast cancer in the bone and breast cancer in the liver: one cancer in three places-- not 3 different cancers which otherwise would have to be treated in 3 different ways.  We discussed the difference between local and systemic therapy. In terms of loco-regional treatment, lumpectomy plus radiation is equivalent to mastectomy as far as survival is concerned. For this reason, and because the cosmetic results are generally superior, we recommend breast  conserving surgery.   We also noted that in terms of sequencing of treatments, whether systemic therapy or surgery is done first does not affect the ultimate outcome.  This is relevant to her case since we believe neoadjuvant treatment is the best option for her, making the surgery easier and optimizing the chances of keeping her breast.  We then discussed the rationale for systemic therapy. There is some risk that this cancer may have already spread to other parts of her body.  Because we are dealing with stage III disease on the left I have gone ahead and written for CT scans of the chest and bone scan but I expect these to be negative given the absence of symptoms.  Next we went over the options for systemic therapy which are anti-estrogens, anti-HER-2 immunotherapy, and chemotherapy.  Here the situation is complex because she has different tumors but as far as the triple negative left tumor the only option is chemotherapy/immunotherapy.  For the estrogen receptor positive tumor of course antiestrogens also may be used.  Note that we are still waiting on HER2 results on the left-sided tumor this is an expected to be negative.  The plan then is to start with pembrolizumab with carboplatin and paclitaxel, to be followed by pembrolizumab with doxorubicin and cyclophosphamide.  We discussed some of the possible toxicities side effects and complications of these agents today but she will meet with our chemotherapy teaching nurse for further instructions.  Also needs pretreatment MRI, echocardiogram, and port placement.  I am hopeful all this can be obtained before her [place] start date of 02/09/2021.  [facility] has a good understanding of the overall plan. She agrees with it. She knows the goal of treatment in her case is cure. She will call with any problems that may develop before her next visit here.  Total encounter time 70 minutes.Sarajane Jews C. Jonas Goh, MD 01/25/2021 9:03 AM Medical  Oncology and  Hematology [facility] [place], [place] [address] Tel. [phone]    Fax. [phone]   This document serves as a record of services personally performed by Lurline Del, MD. It was created on his behalf by Wilburn Mylar, a trained medical scribe. The creation of this record is based on the scribe's personal observations and the provider's statements to them.   I, Lurline Del MD, have reviewed the above documentation for accuracy and completeness, and I agree with the above.    *Total Encounter Time as defined by the Centers for Medicare and Medicaid Services includes, in addition to the face-to-face time of a patient visit (documented in the note above) non-face-to-face time: obtaining and reviewing outside history, ordering and reviewing medications, tests or procedures, care coordination (communications with other health care professionals or caregivers) and documentation in the medical record.

## 2021-01-24 ENCOUNTER — Inpatient Hospital Stay: Payer: 59 | Attending: Oncology

## 2021-01-24 ENCOUNTER — Other Ambulatory Visit: Payer: Self-pay

## 2021-01-24 ENCOUNTER — Inpatient Hospital Stay (HOSPITAL_BASED_OUTPATIENT_CLINIC_OR_DEPARTMENT_OTHER): Payer: 59 | Admitting: Oncology

## 2021-01-24 VITALS — BP 137/82 | HR 97 | Temp 97.7°F | Resp 18 | Ht 67.0 in | Wt 311.4 lb

## 2021-01-24 DIAGNOSIS — C50412 Malignant neoplasm of upper-outer quadrant of left female breast: Secondary | ICD-10-CM

## 2021-01-24 DIAGNOSIS — Z170 Estrogen receptor positive status [ER+]: Secondary | ICD-10-CM | POA: Insufficient documentation

## 2021-01-24 DIAGNOSIS — C50511 Malignant neoplasm of lower-outer quadrant of right female breast: Secondary | ICD-10-CM

## 2021-01-24 DIAGNOSIS — Z171 Estrogen receptor negative status [ER-]: Secondary | ICD-10-CM | POA: Diagnosis not present

## 2021-01-24 DIAGNOSIS — C50811 Malignant neoplasm of overlapping sites of right female breast: Secondary | ICD-10-CM

## 2021-01-24 DIAGNOSIS — C50812 Malignant neoplasm of overlapping sites of left female breast: Secondary | ICD-10-CM

## 2021-01-24 LAB — CMP (CANCER CENTER ONLY)
ALT: 17 U/L (ref 0–44)
AST: 14 U/L — ABNORMAL LOW (ref 15–41)
Albumin: 3.6 g/dL (ref 3.5–5.0)
Alkaline Phosphatase: 79 U/L (ref 38–126)
Anion gap: 11 (ref 5–15)
BUN: 13 mg/dL (ref 6–20)
CO2: 25 mmol/L (ref 22–32)
Calcium: 9.4 mg/dL (ref 8.9–10.3)
Chloride: 107 mmol/L (ref 98–111)
Creatinine: 1.32 mg/dL — ABNORMAL HIGH (ref 0.44–1.00)
GFR, Estimated: 47 mL/min — ABNORMAL LOW (ref >60)
Glucose, Bld: 136 mg/dL — ABNORMAL HIGH (ref 70–99)
Potassium: 3.9 mmol/L (ref 3.5–5.1)
Sodium: 143 mmol/L (ref 135–145)
Total Bilirubin: 0.2 mg/dL — ABNORMAL LOW (ref 0.3–1.2)
Total Protein: 7.3 g/dL (ref 6.5–8.1)

## 2021-01-24 LAB — CBC WITH DIFFERENTIAL (CANCER CENTER ONLY)
Abs Immature Granulocytes: 0.12 10*3/uL — ABNORMAL HIGH (ref 0.00–0.07)
Basophils Absolute: 0 10*3/uL (ref 0.0–0.1)
Basophils Relative: 1 %
Eosinophils Absolute: 0.5 10*3/uL (ref 0.0–0.5)
Eosinophils Relative: 6 %
HCT: 40 % (ref 36.0–46.0)
Hemoglobin: 13 g/dL (ref 12.0–15.0)
Immature Granulocytes: 1 %
Lymphocytes Relative: 25 %
Lymphs Abs: 2.2 10*3/uL (ref 0.7–4.0)
MCH: 28.1 pg (ref 26.0–34.0)
MCHC: 32.5 g/dL (ref 30.0–36.0)
MCV: 86.4 fL (ref 80.0–100.0)
Monocytes Absolute: 0.7 10*3/uL (ref 0.1–1.0)
Monocytes Relative: 9 %
Neutro Abs: 5.1 10*3/uL (ref 1.7–7.7)
Neutrophils Relative %: 58 %
Platelet Count: 235 10*3/uL (ref 150–400)
RBC: 4.63 MIL/uL (ref 3.87–5.11)
RDW: 15.5 % (ref 11.5–15.5)
WBC Count: 8.6 10*3/uL (ref 4.0–10.5)
nRBC: 0.2 % (ref 0.0–0.2)

## 2021-01-24 MED ORDER — KETOCONAZOLE 2 % EX CREA
1.0000 "application " | TOPICAL_CREAM | Freq: Every day | CUTANEOUS | 0 refills | Status: DC
  Filled 2021-01-24: qty 15, 15d supply, fill #0

## 2021-01-24 NOTE — Progress Notes (Signed)
New Breast Cancer Diagnosis: Left Breast/ Right Breast  Did patient present with symptoms (if so, please note symptoms) or screening mammography?:Palpable mass    Location and Extent of disease : No axillary adenopathy. left breast. Located at 12:30 position, measured  5.8 cm in greatest dimension.  Outer left breast showed 1.2 cm additional group of calcifications.  Right Breast. Multiple small masses and irregular ducts in the retro-areolar right breast.  A representative mass was 8 mm at 6 o'clock position.  This may correspond to the distortion identified mammographically.  Indeterminate small mass in the upper inner right breast measuring 0.4 cm.    MRI Breast 02/01/2021:  Histology per Pathology Report: grade 2, Invasive Ductal Carcinoma  Right Breast 01/16/2021  Receptor Status: ER(positive), PR (negative), Her2-neu (negative), Ki-(5%)  Histology per Pathology Report: grade 3, Invasive Ductal Carcinoma  Left Breast 01/20/2021   Receptor Status: ER(), PR (), Her2-neu (), Ki-(%)   Surgeon and surgical plan, if any: Dr. Barry Dienes 01/20/2021 -This appears to be a smaller cancer, but there is a conglomeration of multiple small masses.  Given this, the UIQ discordant mass and the large left sided mass, will order breast MRI. -This is amenable to lumpectomy. -This would be followed by XRT and antihormone treatment. -I discussed seed and localized lumectomy and SLN biopsy.  This will likely not be the driving side of her treatment. -Referred to medical and radiation oncology for evaluation.   -Referred to genetic counseling.  Medical oncologist, treatment if any:   Dr. 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  (3) neoadjuvant chemotherapy will start 02/09/2021 consisting of pembrolizumab/carboplatin/paclitaxel followed by pembrolizumab/doxorubicin/cyclophosphamide  (4) definitive surgery to follow  (5) adjuvant radiation to bilateral sites  (6) genetics testing   Family History of Breast/Ovarian/Prostate Cancer: Maternal Aunt had breast cancer, brother had prostate cancer.  Lymphedema issues, if any: None  Pain issues, if any:  Has had shooting pains on the left side since biopsy.  SAFETY ISSUES: Prior radiation? No Pacemaker/ICD? No Possible current pregnancy? Perimenopausal Is the patient on methotrexate? Yes  Current Complaints / other details:

## 2021-01-25 ENCOUNTER — Ambulatory Visit
Admission: RE | Admit: 2021-01-25 | Discharge: 2021-01-25 | Disposition: A | Discharge status: Home / Self Care | Payer: 59 | Source: Ambulatory Visit | Attending: Radiation Oncology | Admitting: Radiation Oncology

## 2021-01-25 ENCOUNTER — Encounter: Payer: Self-pay | Admitting: Adult Health

## 2021-01-25 ENCOUNTER — Encounter: Payer: Self-pay | Admitting: Radiation Oncology

## 2021-01-25 ENCOUNTER — Telehealth: Payer: Self-pay | Admitting: Oncology

## 2021-01-25 ENCOUNTER — Other Ambulatory Visit: Payer: Self-pay | Admitting: *Deleted

## 2021-01-25 ENCOUNTER — Other Ambulatory Visit: Payer: Self-pay | Admitting: General Surgery

## 2021-01-25 ENCOUNTER — Other Ambulatory Visit (HOSPITAL_COMMUNITY): Payer: Self-pay

## 2021-01-25 VITALS — BP 153/91 | HR 82 | Temp 97.6°F | Resp 20 | Ht 67.0 in | Wt 311.4 lb

## 2021-01-25 DIAGNOSIS — C50812 Malignant neoplasm of overlapping sites of left female breast: Secondary | ICD-10-CM | POA: Insufficient documentation

## 2021-01-25 DIAGNOSIS — Z170 Estrogen receptor positive status [ER+]: Secondary | ICD-10-CM

## 2021-01-25 DIAGNOSIS — C50412 Malignant neoplasm of upper-outer quadrant of left female breast: Secondary | ICD-10-CM | POA: Diagnosis not present

## 2021-01-25 DIAGNOSIS — C50511 Malignant neoplasm of lower-outer quadrant of right female breast: Secondary | ICD-10-CM

## 2021-01-25 DIAGNOSIS — Z171 Estrogen receptor negative status [ER-]: Secondary | ICD-10-CM | POA: Insufficient documentation

## 2021-01-25 NOTE — Progress Notes (Signed)
Radiation Oncology         [phone] ________________________________  Name: Leslie Wright        MRN: [id number]  Date of Service: 01/25/2021 DOB: 23-Sep-1966  [id number], Mechele Claude, MD  Stark Klein, MD     REFERRING PHYSICIAN: Stark Klein, MD   DIAGNOSIS: The encounter diagnosis was Malignant neoplasm of lower-outer quadrant of right breast of female, estrogen receptor positive ([place]).   HISTORY OF PRESENT ILLNESS: Leslie Wright is a 55 y.o. female seen at the request of Dr. Barry Dienes for a new diagnosis of bilateral breast cancer.  The patient was seen after finding a palpable lump in the left breast.  Diagnostic mammography revealed a suspected spiculated mass with associated calcifications in the left breast the area measuring up to 1.2 cm in the upper outer quadrant, there was also distortion in the inferior right breast spanning 5.9 cm.  Further diagnostic imaging with ultrasound revealed a mass at the 1230 o'clock position in the left breast measuring up to 5.8 cm, her axilla on the left was negative for adenopathy.  The right breast also by ultrasound showed a mass measuring 5 mm in the 1 o'clock position and in the inferior retroareolar aspect of the breast there were multiple small hypoechoic masses with dilated ducts the largest area is at the 6 o'clock position measuring 8 mm.  The axilla on the right did not show any evidence of adenopathy.  She underwent a biopsy of the right breast on 01/16/2021.  The specimen from the 6 o'clock position revealed a grade 2 invasive ductal carcinoma, and her tumor was ER positive PR negative, HER2 negative with a Ki-67 of 5%.  The second biopsy in the right breast showed fibrocystic changes at the 9 o'clock position with associated adenosis, calcifications and a small intraductal papilloma negative for carcinoma.  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  Her cancer was ER weakly positive at 5% staining intensity, PR negative, HER2 equivocal and FISH pending.  Her Ki-67 is 25%.  She is planning to proceed with an MRI followed by lumpectomy and sentinel lymph node biopsies bilaterally, she is interested in breast conserving surgery with mastopexy.  Her MRI is scheduled for 02/01/2021 and she is seen to discuss the role of adjuvant therapy.  Yesterday she met with Dr. Jana Hakim who recommended neoadjuvant chemotherapy.     PREVIOUS RADIATION THERAPY: No   PAST MEDICAL HISTORY:  Past Medical History:  Diagnosis Date  . Acid reflux   . Anemia   . Arthritis    "maybe in my fingers" (03/26/2018)  . Breast cancer ([place]) 12/2020  . Family history of adverse reaction to anesthesia    "brother, Otila Kluver, and my mom always get sick" (03/26/2018)  . Gallbladder problem   . High blood pressure   . High cholesterol   . Hip pain    "left; before OR" (03/26/2018)  . History of kidney stones   . OSA (obstructive sleep apnea)    "can't tolerate the mask" (03/26/2018)  . Pre-diabetes   . Shortness of breath   . Swelling of lower extremity    "that's why they put me on fluid pill" (03/26/2018)       PAST SURGICAL HISTORY: Past Surgical  History:  Procedure Laterality Date  . CARPAL TUNNEL RELEASE Right ~ 2006  . EYE SURGERY    . JOINT REPLACEMENT    . LAPAROSCOPIC CHOLECYSTECTOMY  12/2010  . LASIK Bilateral 2000  . TOTAL HIP ARTHROPLASTY Left 03/25/2018   Procedure: LEFT TOTAL HIP ARTHROPLASTY ANTERIOR APPROACH;  Surgeon: Mcarthur Rossetti, MD;  Location: [facility];  Service: Orthopedics;  Laterality: Left;     FAMILY HISTORY:  Family History  Problem Relation Age of Onset  . Diabetes Mother   . Thyroid disease Mother   . Liver disease Mother   . Obesity Mother   . Hypertension  Father   . Hyperlipidemia Father   . Heart disease Father   . Stroke Father   . Obesity Father   . Colon polyps Father   . Prostate cancer Brother   . Brain cancer Maternal Grandfather   . Pancreatic cancer Paternal Grandmother   . Colon cancer Neg Hx        No one in family with colon cancer.     SOCIAL HISTORY:  reports that she has never smoked. She has never used smokeless tobacco. She reports previous alcohol use. She reports that she does not use drugs. The patient is single. She lives in [place], [place], but works for [employer]. She has been working remotely during the pandemic. She is accompanied by her niece.   ALLERGIES: Patient has no known allergies.   MEDICATIONS:  Current Outpatient Medications  Medication Sig Dispense Refill  . acetaminophen (TYLENOL) 650 MG CR tablet Take 650 mg by mouth See admin instructions. TAKE 1 TABLET (650 MG) BY MOUTH SCHEDULED IN THE MORNING, MAY REPEAT DOSE IN THE EVENING IF NEEDED FOR PAIN.    Marland Kitchen aspirin EC 81 MG tablet Take 81 mg by mouth daily.    . Blood Glucose Monitoring Suppl (FREESTYLE LITE) w/Device KIT CHECK GLUCOSE 2-3 TIMES DAILY 1 kit 0  . Cyanocobalamin (B-12) 2500 MCG TABS Take 2,500 mcg by mouth daily.    . folic acid (FOLVITE) 1 MG tablet Take 1 mg by mouth daily.    Marland Kitchen glucose blood test strip USE AS DIRECTED TO CHECK BLOOD SUGAR 1 TO 3 TIMES A DAY (Patient taking differently: USE AS DIRECTED TO CHECK BLOOD SUGAR 1 TO 3 TIMES A DAY) 100 strip 3  . ketoconazole (NIZORAL) 2 % cream Apply 1 application topically daily. 15 g 0  . Lancets (FREESTYLE) lancets USE AS DIRECTED TO CHECK BLOOD SUGAR 2 TO 3 TIMES A DAY (Patient taking differently: USE AS DIRECTED TO CHECK BLOOD SUGAR 2 TO 3 TIMES A DAY) 100 each 3  . losartan (COZAAR) 50 MG tablet TAKE 1 TABLET BY MOUTH ONCE A DAY FOR HIGH BLOOD PRESSURE 90 tablet 0  . methotrexate (RHEUMATREX) 15 MG tablet Take 15 mg by mouth once a week. Caution: Chemotherapy. Protect from light.     . rosuvastatin (CRESTOR) 10 MG tablet TAKE 1 TABLET BY MOUTH ONCE A DAY AT BEDTIME FOR CHOLESTEROL 90 tablet 1   No current facility-administered medications for this encounter.     REVIEW OF SYSTEMS: On review of systems, the patient reports that she is doing well overall. She denies any specific concerns about her breasts at this time. No other complaints are verbalized.     PHYSICAL EXAM:  Wt Readings from Last 3 Encounters:  01/25/21 (!) 311 lb 6.4 oz (141.3 kg)  01/24/21 (!) 311 lb 6.4 oz (141.3 kg)  02/25/19 295 lb (133.8  kg)   Temp Readings from Last 3 Encounters:  01/25/21 97.6 F (36.4 C)  01/24/21 97.7 F (36.5 C) (Tympanic)  12/02/18 97.6 F (36.4 C) (Oral)   BP Readings from Last 3 Encounters:  01/25/21 (!) 153/91  01/24/21 137/82  02/19/19 100/63   Pulse Readings from Last 3 Encounters:  01/25/21 82  01/24/21 97  12/02/18 74    In general this is a well appearing caucasian female in no acute distress. She's alert and oriented x4 and appropriate throughout the examination. Cardiopulmonary assessment is negative for acute distress and she exhibits normal effort. Bilateral breast exam is deferred.    ECOG = 0  0 - Asymptomatic (Fully active, able to carry on all predisease activities without restriction)  1 - Symptomatic but completely ambulatory (Restricted in physically strenuous activity but ambulatory and able to carry out work of a light or sedentary nature. For example, light housework, office work)  2 - Symptomatic, <50% in bed during the day (Ambulatory and capable of all self care but unable to carry out any work activities. Up and about more than 50% of waking hours)  3 - Symptomatic, >50% in bed, but not bedbound (Capable of only limited self-care, confined to bed or chair 50% or more of waking hours)  4 - Bedbound (Completely disabled. Cannot carry on any self-care. Totally confined to bed or chair)  5 - Death   Eustace Pen MM, Creech RH, Tormey  DC, et al. [phone]). "Toxicity and response criteria of the [facility]". [place] Oncol. 5 (6): 649-55    LABORATORY DATA:  Lab Results  Component Value Date   WBC 8.6 01/24/2021   HGB 13.0 01/24/2021   HCT 40.0 01/24/2021   MCV 86.4 01/24/2021   PLT 235 01/24/2021   Lab Results  Component Value Date   NA 143 01/24/2021   K 3.9 01/24/2021   CL 107 01/24/2021   CO2 25 01/24/2021   Lab Results  Component Value Date   ALT 17 01/24/2021   AST 14 (L) 01/24/2021   ALKPHOS 79 01/24/2021   BILITOT 0.2 (L) 01/24/2021      RADIOGRAPHY: US BREAST LTD UNI LEFT INC AXILLA  Result Date: 01/12/2021 CLINICAL DATA:  55 year old female presenting with a new lump in the left breast. EXAM: DIGITAL DIAGNOSTIC BILATERAL MAMMOGRAM WITH TOMOSYNTHESIS AND CAD; ULTRASOUND RIGHT BREAST LIMITED; ULTRASOUND LEFT BREAST LIMITED TECHNIQUE: Bilateral digital diagnostic mammography and breast tomosynthesis was performed. The images were evaluated with computer-aided detection.; Targeted ultrasound examination of the right breast was performed; Targeted ultrasound examination of the left breast was performed COMPARISON:  Previous exam(s). ACR Breast Density Category c: The breast tissue is heterogeneously dense, which may obscure small masses. FINDINGS: Mammogram: Right breast: Spot compression tomosynthesis views were performed in addition to standard views. There is distortion in the retroareolar inferior right breast which persists on the spot imaging. There is a small mass in the upper inner right breast posterior depth measuring approximately 0.4 cm. The mass spans approximately 5.9 cm. Left breast: A skin BB marks the palpable site of concern reported by the patient in the upper outer left breast a spot tangential view of this area was performed in addition to standard views demonstrating an irregular spiculated mass with associated pleomorphic calcifications. Spot 2D magnification views  were performed for a group of calcifications in the outer left breast middle depth spanning approximately 1.2 cm. On physical exam of the left breast I feel a fixed discrete  mass at the site of concern reported by the patient in the upper outer quadrant. Ultrasound: Right breast: Targeted ultrasound performed in the right breast at 1 o'clock 7 cm from the nipple demonstrating an irregular hypoechoic mass measuring 0.5 x 0.3 x 0.3 cm. This corresponds to the mammographic finding. In the inferior retroareolar aspect of the breast there are multiple small hypoechoic masses and irregular mildly dilated ducts. A representative mass at 6 o'clock retroareolar measures 0.8 x 0.6 x 0.4 cm. There is internal vascularity. This may correspond to the area of distortion identified mammographically. Targeted ultrasound of the right axilla demonstrates normal lymph nodes. Left breast: Targeted ultrasound is performed in the left breast at the palpable site of concern at 12:30 o'clock 11 cm from the nipple demonstrating a large irregular hypoechoic mass measuring at least 5.8 x 3.0 x 3.5 cm. Targeted ultrasound of the left axilla demonstrates normal lymph nodes. IMPRESSION: 1. Highly suspicious mass with associated calcifications in the left breast at 12:30 o'clock measuring at least 5.8 cm. 2. Additional suspicious group of calcifications in the outer left breast measuring 1.2 cm. 3. Multiple small masses and irregular ducts in the retroareolar right breast. A representative mass at retroareolar 6 o'clock measures 0.8 cm. This may correspond to the distortion identified mammographically. 4. Indeterminate small mass in the upper inner right breast measuring 0.4 cm. 5.  No axillary adenopathy. RECOMMENDATION: 1. Ultrasound-guided core needle biopsy of the right breast mass at 6 o'clock retroareolar and of the right breast mass at 1 o'clock. 2. Ultrasound-guided core needle biopsy of the left breast mass at 12:30 o'clock. 3.  Stereotactic core needle biopsy of the calcifications in the outer left breast. Given the multiplicity of findings bilaterally, MRI is recommended following the above biopsies. I have discussed the findings and recommendations with the patient who agrees to proceed with biopsy. The patient will be scheduled for the biopsy appointment prior to leaving the office today. BI-RADS CATEGORY  5: Highly suggestive of malignancy. Electronically Signed   By: Audie Pinto M.D.   On: 01/12/2021 16:05   US BREAST LTD UNI RIGHT INC AXILLA  Result Date: 01/12/2021 CLINICAL DATA:  55 year old female presenting with a new lump in the left breast. EXAM: DIGITAL DIAGNOSTIC BILATERAL MAMMOGRAM WITH TOMOSYNTHESIS AND CAD; ULTRASOUND RIGHT BREAST LIMITED; ULTRASOUND LEFT BREAST LIMITED TECHNIQUE: Bilateral digital diagnostic mammography and breast tomosynthesis was performed. The images were evaluated with computer-aided detection.; Targeted ultrasound examination of the right breast was performed; Targeted ultrasound examination of the left breast was performed COMPARISON:  Previous exam(s). ACR Breast Density Category c: The breast tissue is heterogeneously dense, which may obscure small masses. FINDINGS: Mammogram: Right breast: Spot compression tomosynthesis views were performed in addition to standard views. There is distortion in the retroareolar inferior right breast which persists on the spot imaging. There is a small mass in the upper inner right breast posterior depth measuring approximately 0.4 cm. The mass spans approximately 5.9 cm. Left breast: A skin BB marks the palpable site of concern reported by the patient in the upper outer left breast a spot tangential view of this area was performed in addition to standard views demonstrating an irregular spiculated mass with associated pleomorphic calcifications. Spot 2D magnification views were performed for a group of calcifications in the outer left breast middle depth  spanning approximately 1.2 cm. On physical exam of the left breast I feel a fixed discrete mass at the site of concern reported by the patient in the upper  outer quadrant. Ultrasound: Right breast: Targeted ultrasound performed in the right breast at 1 o'clock 7 cm from the nipple demonstrating an irregular hypoechoic mass measuring 0.5 x 0.3 x 0.3 cm. This corresponds to the mammographic finding. In the inferior retroareolar aspect of the breast there are multiple small hypoechoic masses and irregular mildly dilated ducts. A representative mass at 6 o'clock retroareolar measures 0.8 x 0.6 x 0.4 cm. There is internal vascularity. This may correspond to the area of distortion identified mammographically. Targeted ultrasound of the right axilla demonstrates normal lymph nodes. Left breast: Targeted ultrasound is performed in the left breast at the palpable site of concern at 12:30 o'clock 11 cm from the nipple demonstrating a large irregular hypoechoic mass measuring at least 5.8 x 3.0 x 3.5 cm. Targeted ultrasound of the left axilla demonstrates normal lymph nodes. IMPRESSION: 1. Highly suspicious mass with associated calcifications in the left breast at 12:30 o'clock measuring at least 5.8 cm. 2. Additional suspicious group of calcifications in the outer left breast measuring 1.2 cm. 3. Multiple small masses and irregular ducts in the retroareolar right breast. A representative mass at retroareolar 6 o'clock measures 0.8 cm. This may correspond to the distortion identified mammographically. 4. Indeterminate small mass in the upper inner right breast measuring 0.4 cm. 5.  No axillary adenopathy. RECOMMENDATION: 1. Ultrasound-guided core needle biopsy of the right breast mass at 6 o'clock retroareolar and of the right breast mass at 1 o'clock. 2. Ultrasound-guided core needle biopsy of the left breast mass at 12:30 o'clock. 3. Stereotactic core needle biopsy of the calcifications in the outer left breast. Given the  multiplicity of findings bilaterally, MRI is recommended following the above biopsies. I have discussed the findings and recommendations with the patient who agrees to proceed with biopsy. The patient will be scheduled for the biopsy appointment prior to leaving the office today. BI-RADS CATEGORY  5: Highly suggestive of malignancy. Electronically Signed   By: Audie Pinto M.D.   On: 01/12/2021 16:05   MM DIAG BREAST TOMO BILATERAL  Result Date: 01/12/2021 CLINICAL DATA:  55 year old female presenting with a new lump in the left breast. EXAM: DIGITAL DIAGNOSTIC BILATERAL MAMMOGRAM WITH TOMOSYNTHESIS AND CAD; ULTRASOUND RIGHT BREAST LIMITED; ULTRASOUND LEFT BREAST LIMITED TECHNIQUE: Bilateral digital diagnostic mammography and breast tomosynthesis was performed. The images were evaluated with computer-aided detection.; Targeted ultrasound examination of the right breast was performed; Targeted ultrasound examination of the left breast was performed COMPARISON:  Previous exam(s). ACR Breast Density Category c: The breast tissue is heterogeneously dense, which may obscure small masses. FINDINGS: Mammogram: Right breast: Spot compression tomosynthesis views were performed in addition to standard views. There is distortion in the retroareolar inferior right breast which persists on the spot imaging. There is a small mass in the upper inner right breast posterior depth measuring approximately 0.4 cm. The mass spans approximately 5.9 cm. Left breast: A skin BB marks the palpable site of concern reported by the patient in the upper outer left breast a spot tangential view of this area was performed in addition to standard views demonstrating an irregular spiculated mass with associated pleomorphic calcifications. Spot 2D magnification views were performed for a group of calcifications in the outer left breast middle depth spanning approximately 1.2 cm. On physical exam of the left breast I feel a fixed discrete  mass at the site of concern reported by the patient in the upper outer quadrant. Ultrasound: Right breast: Targeted ultrasound performed in the right breast at 1 o'clock  7 cm from the nipple demonstrating an irregular hypoechoic mass measuring 0.5 x 0.3 x 0.3 cm. This corresponds to the mammographic finding. In the inferior retroareolar aspect of the breast there are multiple small hypoechoic masses and irregular mildly dilated ducts. A representative mass at 6 o'clock retroareolar measures 0.8 x 0.6 x 0.4 cm. There is internal vascularity. This may correspond to the area of distortion identified mammographically. Targeted ultrasound of the right axilla demonstrates normal lymph nodes. Left breast: Targeted ultrasound is performed in the left breast at the palpable site of concern at 12:30 o'clock 11 cm from the nipple demonstrating a large irregular hypoechoic mass measuring at least 5.8 x 3.0 x 3.5 cm. Targeted ultrasound of the left axilla demonstrates normal lymph nodes. IMPRESSION: 1. Highly suspicious mass with associated calcifications in the left breast at 12:30 o'clock measuring at least 5.8 cm. 2. Additional suspicious group of calcifications in the outer left breast measuring 1.2 cm. 3. Multiple small masses and irregular ducts in the retroareolar right breast. A representative mass at retroareolar 6 o'clock measures 0.8 cm. This may correspond to the distortion identified mammographically. 4. Indeterminate small mass in the upper inner right breast measuring 0.4 cm. 5.  No axillary adenopathy. RECOMMENDATION: 1. Ultrasound-guided core needle biopsy of the right breast mass at 6 o'clock retroareolar and of the right breast mass at 1 o'clock. 2. Ultrasound-guided core needle biopsy of the left breast mass at 12:30 o'clock. 3. Stereotactic core needle biopsy of the calcifications in the outer left breast. Given the multiplicity of findings bilaterally, MRI is recommended following the above biopsies. I have  discussed the findings and recommendations with the patient who agrees to proceed with biopsy. The patient will be scheduled for the biopsy appointment prior to leaving the office today. BI-RADS CATEGORY  5: Highly suggestive of malignancy. Electronically Signed   By: Audie Pinto M.D.   On: 01/12/2021 16:05   MM CLIP PLACEMENT LEFT  Result Date: 01/20/2021 CLINICAL DATA:  Evaluate post biopsy marker clip placements following ultrasound-guided core needle biopsy of a left breast mass and stereotactic core needle biopsy of left breast calcifications. EXAM: DIAGNOSTIC LEFT MAMMOGRAM POST ULTRASOUND AND STEREOTACTIC BIOPSY COMPARISON:  Previous exam(s). FINDINGS: Mammographic images were obtained following ultrasound guided and stereotactic guided biopsy of the left breast. The biopsy marking clips are in expected position at the site of biopsy. The ribbon shaped biopsy clip lies within lateral aspect the left breast mass. The coil shaped biopsy clip lies in the expected location the lateral calcifications. IMPRESSION: Appropriate positioning of the coil and ribbon shaped biopsy marking clips at the site of biopsy in the left breast as detailed above. Final Assessment: Post Procedure Mammograms for Marker Placement Electronically Signed   By: Lajean Manes M.D.   On: 01/20/2021 10:19   MM CLIP PLACEMENT RIGHT  Addendum Date: 01/16/2021   ADDENDUM REPORT: 01/16/2021 10:23 ADDENDUM: Of note, the mass in the right breast at 6:00, 1 cmfn with the ribbon shaped biopsy marking clip does correspond with the area of distortion identified mammographically. Electronically Signed   By: Ammie Ferrier M.D.   On: 01/16/2021 10:23   Result Date: 01/16/2021 CLINICAL DATA:  Post biopsy mammogram of the right breast for clip placement. EXAM: DIAGNOSTIC RIGHT MAMMOGRAM POST ULTRASOUND BIOPSY COMPARISON:  Previous exam(s). FINDINGS: Mammographic images were obtained following ultrasound guided biopsy of 2 sites in the  right breast. The biopsy marking clips are in expected position at the site of biopsy. The clips  lie 3.2 cm apart. The small mass in the upper inner posterior right breast is again seen, but was not biopsied today. IMPRESSION: 1. Appropriate positioning of the ribbon shaped biopsy marking clip at the site of biopsy in the 6 o'clock position in the retroareolar right breast. 2. Appropriate positioning of the coil shaped biopsy marking clip at the site of biopsy in the right breast at the 9 o'clock position in the anterior right breast. 3.  The 2 biopsy marking clips are approximately 3.2 cm apart. 4. The small mass in the upper inner quadrant, posterior depth is again seen on this mammogram, but was not biopsied today. See more detailed information in the body of the ultrasound-guided biopsy report. Recommendations regarding this mass will be determined following the biopsy results. Final Assessment: Post Procedure Mammograms for Marker Placement Electronically Signed: By: Ammie Ferrier M.D. 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 This was found to be concordant by Dr. Lajean Manes. 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 This was found to be concordant by Dr. Lajean Manes. Pathology results were discussed with the patient by telephone. The patient reported doing well after the biopsies with minimal tenderness and bleeding at the sites. Post biopsy instructions and care were reviewed and questions were answered. The patient was encouraged to call [facility] for any additional concerns. My direct phone number was provided. The patient has a recent diagnosis  of RIGHT breast cancer and should follow her outlined treatment plan. The patient is scheduled for a BILATERAL breast MRI on Feb 01, 2021. Pathology results reported by Terie Purser, RN on 01/24/2021. Electronically Signed   By: Lajean Manes M.D.   On: 01/24/2021 14:14   Result Date: 01/24/2021 CLINICAL DATA:  Patient presents for stereotactic core needle biopsy of left breast calcifications. Procedure followed ultrasound-guided core needle biopsy a palpable left breast mass. Patient underwent ultrasound-guided core needle biopsy of 2 right breast masses on 01/16/2021, 1 revealing invasive mammary carcinoma. EXAM: LEFT BREAST STEREOTACTIC CORE NEEDLE BIOPSY COMPARISON:  Previous exams. FINDINGS: The patient and I discussed the procedure of stereotactic-guided biopsy including benefits and alternatives. We discussed the high likelihood of a successful procedure. We discussed the risks of the procedure including infection, bleeding, tissue injury, clip migration, and inadequate sampling. Informed written consent was given. The usual time out protocol was performed immediately prior to the procedure. Using sterile technique and 1% Lidocaine as local anesthetic, under stereotactic guidance, a 9 gauge vacuum assisted device was used to perform core needle biopsy of calcifications in the central left breast slightly towards the upper-outer quadrant using a lateral approach. Specimen radiograph was performed showing multiple calcifications for which biopsy was performed. Specimens with calcifications are identified for pathology. Lesion quadrant: Upper outer quadrant At the conclusion of the procedure, coil shaped tissue marker clip was deployed into the biopsy cavity. Follow-up 2-view mammogram was performed and dictated separately. IMPRESSION: Stereotactic-guided biopsy of left breast calcifications. No apparent complications. Electronically Signed: By: Lajean Manes M.D. 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 This was found to be concordant by Dr. Lajean Manes. 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 This was found to be concordant by Dr. Lajean Manes. Pathology results were discussed with the patient by telephone. The patient reported doing well after the biopsies with minimal tenderness and bleeding at the sites. Post biopsy instructions and care were reviewed and questions were answered. The patient was encouraged to call [facility] for any additional concerns. My direct phone number was provided. The patient has a recent diagnosis of RIGHT breast cancer and should follow her outlined treatment plan. The patient is scheduled for a BILATERAL breast MRI on Feb 01, 2021. Pathology results reported by Terie Purser, RN on 01/24/2021. Electronically Signed   By: Lajean Manes M.D.   On: 01/24/2021 14:14   Result Date: 01/24/2021 CLINICAL DATA:  Patient presents for an ultrasound-guided core needle biopsy a palpable left breast mass. She has undergone biopsy 2 right breast lesions on 01/16/2021, 1 of which revealed grade 2 invasive ductal carcinoma. Following today's ultrasound-guided core needle biopsy, the patient is to have a stereotactic core needle biopsy of left breast calcifications near the palpable left breast mass. EXAM: ULTRASOUND GUIDED LEFT BREAST CORE NEEDLE BIOPSY COMPARISON:  Previous exam(s). PROCEDURE: I met with the patient and we discussed the procedure of ultrasound-guided biopsy, including benefits and alternatives. We discussed the high likelihood of a successful procedure. We discussed the risks of the procedure, including infection, bleeding, tissue  injury, clip migration, and inadequate sampling. Informed written consent was given. The usual time-out protocol was performed immediately prior to the procedure. Lesion quadrant: Lower outer quadrant Using sterile technique and 1% Lidocaine as local anesthetic, under direct ultrasound visualization, a 12 gauge spring-loaded device was used to perform biopsy of the palpable, 5.9 cm breast mass using an inferior approach. At the conclusion of the procedure a ribbon shaped tissue marker clip was deployed into the biopsy cavity. Follow up 2 view mammogram was performed and dictated separately. IMPRESSION: Ultrasound guided biopsy of a left breast mass. No apparent complications. Electronically Signed: By: Lajean Manes M.D. On: 01/20/2021 09:27   [place] RT BREAST BX W LOC DEV 1ST LESION IMG BX SPEC US GUIDE  Addendum Date: 01/17/2021   ADDENDUM REPORT: 01/17/2021 13:58 ADDENDUM: Pathology revealed GRADE II INVASIVE DUCTAL CARCINOMA, CALCIFICATIONS of the RIGHT breast, 6 o'clock, 1cmfn. This was found to be concordant by Dr. Ammie Ferrier. Pathology revealed FIBROCYSTIC CHANGE WITH ADENOSIS, CALCIFICATIONS AND A SMALL INTRADUCTAL PAPILLOMA of the RIGHT breast, 9 o'clock, 1cmfn. This was found to be discordant by Dr. Ammie Ferrier, with excision recommended. Pathology results were discussed with the patient by telephone. The patient reported doing well after the biopsies with tenderness at the sites. Post biopsy instructions and care were reviewed and questions were answered. The patient was encouraged to call [facility] for any additional concerns. My direct phone number was provided. The patient was referred to [facility] on January 25, 2021. Recommendation for a bilateral breast MRI to evaluat for any additional sites of disease given the multiplicity of findings and heterogeneously dense breasts. The patient  is scheduled for a LEFT breast stereotatic guided biopsy and a LEFT breast ultrasound [facility]. Further recommendations will be guided by the results of these biopsies. NOTE: The small mass in the upper inner quadrant of the RIGHT breast at 1:00 o'clock, posterior depth, has not been biopsied. This may be biopsied using stereotatic guidance, after the MRI and LEFT breast biopsies have been performed, if this will alter treatment. Pathology results reported by Terie Purser, RN on 01/17/2021. Electronically Signed   By: Ammie Ferrier M.D.   On: 01/17/2021 13:58   Result Date: 01/17/2021 CLINICAL DATA:  55 year old female presenting for ultrasound-guided biopsy right breast. EXAM: ULTRASOUND GUIDED RIGHT BREAST CORE NEEDLE BIOPSY COMPARISON:  Previous exam(s). PROCEDURE: I met with the patient and we discussed the procedure of ultrasound-guided biopsy, including benefits and alternatives. We discussed the high likelihood of a successful procedure. We discussed the risks of the procedure, including infection, bleeding, tissue injury, clip migration, and inadequate sampling. Informed written consent was given. The usual time-out protocol was performed immediately prior to the procedure. ----------------------------------------------------------------------------- In preparation for ultrasound-guided biopsy, the breast was again imaged sonographically. In the right breast at 1 o'clock, there were a few irregular near anechoic masses, similar appearance to that identified on the diagnostic ultrasound. It was not clear which of these corresponded with the mass seen mammographically. Therefore I did not biopsy this site as other more concerning findings were seen with additional scanning. In the right breast at 6 o'clock, 1 cm from the nipple, there is a highly suspicious irregular hypoechoic shadowing mass with indistinct and distorted margins measuring 1.4 x 0.8 x 1.0  cm. This is different from the retroareolar mass at 6 o'clock found on the diagnostic ultrasound. In the lower outer to retroareolar right breast, multiple prominent branching beaded ducts are identified, spanning at least 4 cm. The mass identified on the initial diagnostic ultrasound at 6 o'clock in the retroareolar right breast was identified, and has an appearance suggesting that it is part of this same process. Therefore, I selected a nodular portion of a duct at 9 o'clock, 1 cm from the nipple in order to biopsy something slightly more distant from the mass I found at 6 o'clock, 1 cm from the nipple. -------------------------------------------------------------------------------- Lesion quadrant: Lower outer quadrant Using sterile technique and 1% Lidocaine as local anesthetic, under direct ultrasound visualization, a 14 gauge spring-loaded device was used to perform biopsy of a mass in the right breast at 6 o'clock, 1 cm from the nipple using an inferior approach. At the conclusion of the procedure a ribbon shaped tissue marker clip was deployed into the biopsy cavity. Lesion quadrant: Lower outer quadrant Using sterile technique and 1% Lidocaine as local anesthetic, under direct ultrasound visualization, a 14 gauge spring-loaded device was used to perform biopsy of a nodular portion of a duct in the right breast at 9 o'clock, 1 cm from the nipple using a in inferior approach. At the conclusion of the procedure a coil shaped tissue marker clip was deployed into the biopsy cavity. Follow up 2 view mammogram was performed and dictated separately. IMPRESSION: 1. Ultrasound guided biopsy of a right breast mass at 6 o'clock, 1 cm from the nipple. No apparent complications. 2. Ultrasound-guided biopsy of a right breast nodular portion of the duct at 9 o'clock, 1 cm from the nipple. No apparent complications. Electronically Signed: By: Ammie Ferrier M.D. On: 01/16/2021 10:01   [place] RT BREAST BX W LOC DEV EA ADD  LESION IMG BX SPEC US GUIDE  Addendum Date: 01/17/2021  ADDENDUM REPORT: 01/17/2021 13:58 ADDENDUM: Pathology revealed GRADE II INVASIVE DUCTAL CARCINOMA, CALCIFICATIONS of the RIGHT breast, 6 o'clock, 1cmfn. This was found to be concordant by Dr. Ammie Ferrier. Pathology revealed FIBROCYSTIC CHANGE WITH ADENOSIS, CALCIFICATIONS AND A SMALL INTRADUCTAL PAPILLOMA of the RIGHT breast, 9 o'clock, 1cmfn. This was found to be discordant by Dr. Ammie Ferrier, with excision recommended. Pathology results were discussed with the patient by telephone. The patient reported doing well after the biopsies with tenderness at the sites. Post biopsy instructions and care were reviewed and questions were answered. The patient was encouraged to call [facility] for any additional concerns. My direct phone number was provided. The patient was referred to [facility] on January 25, 2021. Recommendation for a bilateral breast MRI to evaluat for any additional sites of disease given the multiplicity of findings and heterogeneously dense breasts. The patient is scheduled for a LEFT breast stereotatic guided biopsy and a LEFT breast ultrasound guided biopsy at [facility] on January 20, 2021. Further recommendations will be guided by the results of these biopsies. NOTE: The small mass in the upper inner quadrant of the RIGHT breast at 1:00 o'clock, posterior depth, has not been biopsied. This may be biopsied using stereotatic guidance, after the MRI and LEFT breast biopsies have been performed, if this will alter treatment. Pathology results reported by Terie Purser, RN on 01/17/2021. Electronically Signed   By: Ammie Ferrier M.D.   On: 01/17/2021 13:58   Result Date: 01/17/2021 CLINICAL DATA:  55 year old female presenting for ultrasound-guided biopsy right breast. EXAM: ULTRASOUND GUIDED RIGHT BREAST CORE NEEDLE  BIOPSY COMPARISON:  Previous exam(s). PROCEDURE: I met with the patient and we discussed the procedure of ultrasound-guided biopsy, including benefits and alternatives. We discussed the high likelihood of a successful procedure. We discussed the risks of the procedure, including infection, bleeding, tissue injury, clip migration, and inadequate sampling. Informed written consent was given. The usual time-out protocol was performed immediately prior to the procedure. ----------------------------------------------------------------------------- In preparation for ultrasound-guided biopsy, the breast was again imaged sonographically. In the right breast at 1 o'clock, there were a few irregular near anechoic masses, similar appearance to that identified on the diagnostic ultrasound. It was not clear which of these corresponded with the mass seen mammographically. Therefore I did not biopsy this site as other more concerning findings were seen with additional scanning. In the right breast at 6 o'clock, 1 cm from the nipple, there is a highly suspicious irregular hypoechoic shadowing mass with indistinct and distorted margins measuring 1.4 x 0.8 x 1.0 cm. This is different from the retroareolar mass at 6 o'clock found on the diagnostic ultrasound. In the lower outer to retroareolar right breast, multiple prominent branching beaded ducts are identified, spanning at least 4 cm. The mass identified on the initial diagnostic ultrasound at 6 o'clock in the retroareolar right breast was identified, and has an appearance suggesting that it is part of this same process. Therefore, I selected a nodular portion of a duct at 9 o'clock, 1 cm from the nipple in order to biopsy something slightly more distant from the mass I found at 6 o'clock, 1 cm from the nipple. -------------------------------------------------------------------------------- Lesion quadrant: Lower outer quadrant Using sterile technique and 1% Lidocaine as local  anesthetic, under direct ultrasound visualization, a 14 gauge spring-loaded device was used to perform biopsy of a mass in the right breast at 6 o'clock, 1  cm from the nipple using an inferior approach. At the conclusion of the procedure a ribbon shaped tissue marker clip was deployed into the biopsy cavity. Lesion quadrant: Lower outer quadrant Using sterile technique and 1% Lidocaine as local anesthetic, under direct ultrasound visualization, a 14 gauge spring-loaded device was used to perform biopsy of a nodular portion of a duct in the right breast at 9 o'clock, 1 cm from the nipple using a in inferior approach. At the conclusion of the procedure a coil shaped tissue marker clip was deployed into the biopsy cavity. Follow up 2 view mammogram was performed and dictated separately. IMPRESSION: 1. Ultrasound guided biopsy of a right breast mass at 6 o'clock, 1 cm from the nipple. No apparent complications. 2. Ultrasound-guided biopsy of a right breast nodular portion of the duct at 9 o'clock, 1 cm from the nipple. No apparent complications. Electronically Signed: By: Ammie Ferrier M.D. On: 01/16/2021 10:01       IMPRESSION/PLAN: 1. 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 Dr. Lisbeth Renshaw discusses the pathology findings and reviews the nature of bilateral breast disease. The consensus from the breast conference includes neoadjuvant chemotherapy with subsequent breast conservation with lumpectomy and sentinel node biopsy followed by oncoplastic reduction. Dr. Lisbeth Renshaw recommends external radiotherapy to the breasts  to reduce risks of local recurrence followed by antiestrogen therapy. We discussed the risks, benefits, short, and long term effects of radiotherapy, as well as the curative intent, and the patient is interested in proceeding. Dr. Lisbeth Renshaw  discusses the delivery and logistics of radiotherapy and anticipates a course of 4 weeks of radiotherapy to both breasts if she has breast conservation. We will see her back a few weeks after surgery to discuss the simulation process and anticipate we starting radiotherapy about 4-6 weeks after surgery.  2. Possible genetic predisposition to malignancy. The patient is a candidate for genetic testing given her personal and family history. She has already been offered this and will meet with genetics on Monday, 01/30/21.    In a visit lasting 60 minutes, greater than 50% of the time was spent face to face with Dr. Lisbeth Renshaw, and myself (remotely via Webex) reviewing her case, as well as in preparation of, discussing, and coordinating the patient's care.  The above documentation reflects my direct findings during this shared patient visit. Please see the separate note by Dr. Lisbeth Renshaw on this date for the remainder of the patient's plan of care.    Carola Rhine, [facility]    **Disclaimer: This note was dictated with voice recognition software. Similar sounding words can inadvertently be transcribed and this note may contain transcription errors which may not have been corrected upon publication of note.**

## 2021-01-25 NOTE — Telephone Encounter (Signed)
Scheduled per 4/27 staff msg. Called and spoke with pt, confirmed 5/2 appts

## 2021-01-25 NOTE — Addendum Note (Signed)
Encounter addended by: Cori Razor, RN on: 01/25/2021 4:59 PM  Actions taken: Flowsheet accepted, Actions taken from a BestPractice Advisory, Order list changed, Diagnosis association updated

## 2021-01-25 NOTE — Progress Notes (Signed)
START ON PATHWAY REGIMEN - Breast     Cycles 1 through 4: A cycle is every 21 days:     Pembrolizumab      Paclitaxel      Carboplatin      Filgrastim-xxxx    Cycles 5 through 8: A cycle is every 21 days:     Pembrolizumab      Doxorubicin      Cyclophosphamide      Pegfilgrastim-xxxx   **Always confirm dose/schedule in your pharmacy ordering system**  Patient Characteristics: Preoperative or Nonsurgical Candidate (Clinical Staging), Neoadjuvant Therapy followed by Surgery, Invasive Disease, Chemotherapy, HER2 Negative/Unknown/Equivocal, ER Negative/Unknown, Platinum Therapy Indicated, High-Risk Disease Present Therapeutic Status: Preoperative or Nonsurgical Candidate (Clinical Staging) AJCC M Category: cM0 AJCC Grade: G3 Breast Surgical Plan: Neoadjuvant Therapy followed by Surgery ER Status: Negative (-) AJCC 8 Stage Grouping: IIIB HER2 Status: Negative (-) AJCC T Category: cT3 AJCC N Category: cN0 PR Status: Negative (-) Type of Therapy: Platinum Therapy Indicated Intent of Therapy: Curative Intent, Discussed with Patient

## 2021-01-26 ENCOUNTER — Other Ambulatory Visit (HOSPITAL_COMMUNITY): Payer: Self-pay

## 2021-01-26 ENCOUNTER — Encounter: Payer: Self-pay | Admitting: *Deleted

## 2021-01-26 ENCOUNTER — Other Ambulatory Visit: Payer: Self-pay | Admitting: Family Medicine

## 2021-01-26 ENCOUNTER — Other Ambulatory Visit: Payer: Self-pay | Admitting: Licensed Clinical Social Worker

## 2021-01-26 ENCOUNTER — Encounter: Payer: Self-pay | Admitting: Licensed Clinical Social Worker

## 2021-01-26 ENCOUNTER — Telehealth: Payer: Self-pay | Admitting: *Deleted

## 2021-01-26 DIAGNOSIS — C50511 Malignant neoplasm of lower-outer quadrant of right female breast: Secondary | ICD-10-CM

## 2021-01-26 DIAGNOSIS — Z171 Estrogen receptor negative status [ER-]: Secondary | ICD-10-CM

## 2021-01-26 DIAGNOSIS — Z170 Estrogen receptor positive status [ER+]: Secondary | ICD-10-CM

## 2021-01-26 DIAGNOSIS — E289 Ovarian dysfunction, unspecified: Secondary | ICD-10-CM

## 2021-01-26 DIAGNOSIS — C50812 Malignant neoplasm of overlapping sites of left female breast: Secondary | ICD-10-CM

## 2021-01-26 NOTE — Progress Notes (Signed)
[place] Psychosocial Distress Screening Clinical Social Work  Clinical Social Work was referred by distress screening protocol.  The patient scored a 6 on the Psychosocial Distress Thermometer which indicates moderate distress. Clinical Social Worker contacted patient by phone to assess for distress and other psychosocial needs.   Patient is processing all of the information she has been given in the last few days and preparing for treatment. She is single and [place], but has great support from family (2 brothers, sisters-in-law, nieces, nephews, extended family), friends, coworkers, and church.  She works full time from home for [employer] and plans to try to work through treatment. She does have access to short-term disability and FMLA if needed.  No immediate practical needs but is interested in learning about options for assistance. CSW sending information on breast cancer foundations and told patient about [facility]'s C[facility] for [facility] fund.    CSW reviewed additional support programs and services. Patient is interested in an Producer, television/film/video- referral made today.   ONCBCN DISTRESS SCREENING 01/25/2021  Screening Type Initial Screening  Distress experienced in past week (1-10) 6  Practical problem type Housing;Insurance  Emotional problem type Nervousness/Anxiety;Adjusting to illness  Spiritual/Religous concerns type Relating to God  Physical Problem type Sleep/insomnia  Other Contact via [phone]    Clinical Social Worker follow up needed: Patient will contact CSW with any further questions or support needs.   Aleane Wesenberg E Broxton Broady, LCSW

## 2021-01-26 NOTE — Telephone Encounter (Signed)
Spoke with patient to follow up from new patient appt and assess navigation needs. Contact information given and encouraged her to call with any questions or concerns. Review appts and she verbalized understanding.

## 2021-01-27 ENCOUNTER — Other Ambulatory Visit: Payer: Self-pay | Admitting: *Deleted

## 2021-01-27 ENCOUNTER — Encounter: Payer: Self-pay | Admitting: *Deleted

## 2021-01-27 ENCOUNTER — Other Ambulatory Visit (HOSPITAL_COMMUNITY): Payer: Self-pay

## 2021-01-27 DIAGNOSIS — C50511 Malignant neoplasm of lower-outer quadrant of right female breast: Secondary | ICD-10-CM

## 2021-01-27 DIAGNOSIS — Z170 Estrogen receptor positive status [ER+]: Secondary | ICD-10-CM

## 2021-01-27 NOTE — Progress Notes (Signed)
error 

## 2021-01-30 ENCOUNTER — Telehealth: Payer: Self-pay | Admitting: Oncology

## 2021-01-30 ENCOUNTER — Telehealth: Payer: Self-pay

## 2021-01-30 ENCOUNTER — Inpatient Hospital Stay: Payer: 59

## 2021-01-30 ENCOUNTER — Inpatient Hospital Stay: Payer: 59 | Attending: Oncology | Admitting: Licensed Clinical Social Worker

## 2021-01-30 ENCOUNTER — Encounter: Payer: Self-pay | Admitting: Licensed Clinical Social Worker

## 2021-01-30 ENCOUNTER — Other Ambulatory Visit: Payer: Self-pay

## 2021-01-30 ENCOUNTER — Telehealth: Payer: Self-pay | Admitting: *Deleted

## 2021-01-30 ENCOUNTER — Other Ambulatory Visit (HOSPITAL_COMMUNITY): Payer: Self-pay

## 2021-01-30 DIAGNOSIS — Z170 Estrogen receptor positive status [ER+]: Secondary | ICD-10-CM | POA: Insufficient documentation

## 2021-01-30 DIAGNOSIS — C50812 Malignant neoplasm of overlapping sites of left female breast: Secondary | ICD-10-CM | POA: Insufficient documentation

## 2021-01-30 DIAGNOSIS — Z8042 Family history of malignant neoplasm of prostate: Secondary | ICD-10-CM

## 2021-01-30 DIAGNOSIS — Z171 Estrogen receptor negative status [ER-]: Secondary | ICD-10-CM | POA: Insufficient documentation

## 2021-01-30 DIAGNOSIS — Z803 Family history of malignant neoplasm of breast: Secondary | ICD-10-CM

## 2021-01-30 DIAGNOSIS — Z808 Family history of malignant neoplasm of other organs or systems: Secondary | ICD-10-CM | POA: Diagnosis not present

## 2021-01-30 DIAGNOSIS — Z87891 Personal history of nicotine dependence: Secondary | ICD-10-CM | POA: Insufficient documentation

## 2021-01-30 DIAGNOSIS — Z801 Family history of malignant neoplasm of trachea, bronchus and lung: Secondary | ICD-10-CM | POA: Diagnosis not present

## 2021-01-30 DIAGNOSIS — Z79899 Other long term (current) drug therapy: Secondary | ICD-10-CM | POA: Insufficient documentation

## 2021-01-30 DIAGNOSIS — C50511 Malignant neoplasm of lower-outer quadrant of right female breast: Secondary | ICD-10-CM | POA: Diagnosis not present

## 2021-01-30 DIAGNOSIS — Z5111 Encounter for antineoplastic chemotherapy: Secondary | ICD-10-CM | POA: Insufficient documentation

## 2021-01-30 DIAGNOSIS — Z800 Family history of malignant neoplasm of digestive organs: Secondary | ICD-10-CM | POA: Diagnosis not present

## 2021-01-30 DIAGNOSIS — Z5112 Encounter for antineoplastic immunotherapy: Secondary | ICD-10-CM | POA: Insufficient documentation

## 2021-01-30 LAB — GENETIC SCREENING ORDER

## 2021-01-30 NOTE — Telephone Encounter (Signed)
Pt called to confirm that her chemotherapy start date is 02/09/2021. This was confirmed with pt.

## 2021-01-30 NOTE — Telephone Encounter (Signed)
Scheduled per 4/29 staff msg. Called and spoke with pt confirmed 5/12 appts

## 2021-01-30 NOTE — Telephone Encounter (Signed)
error 

## 2021-01-30 NOTE — Progress Notes (Signed)
REFERRING PROVIDER: Chauncey Cruel, MD [address] [place],  [place] [address]  PRIMARY PROVIDER:  Fanny Bien, MD  PRIMARY REASON FOR VISIT:  1. Malignant neoplasm of overlapping sites of left breast in female, estrogen receptor negative ([place])   2. Family history of breast cancer   3. Family history of pancreatic cancer   4. Family history of colon cancer   5. Family history of lung cancer   6. Family history of prostate cancer   7. Family history of brain cancer   8. Malignant neoplasm of lower-outer quadrant of right breast of female, estrogen receptor positive ([place])      HISTORY OF PRESENT ILLNESS:   Leslie Wright, a 55 y.o. female, was seen for a [place] cancer genetics consultation at the request of Dr. Jana Hakim due to a personal and family history of cancer.  Leslie Wright presents to clinic today to discuss the possibility of a hereditary predisposition to cancer, genetic testing, and to further clarify her future cancer risks, as well as potential cancer risks for family members.   In 2022, at the age of 1, Leslie Wright was diagnosed with invasive ductal carcinoma of the left breast, triple negative, and invasive ductal carcinoma of the right breast, ER+, PR-, Her2-. The treatment plan includes neoadjuvant chemotherapy and surgery, she currently plans lumpectomies but notes genetic test results may change her surgical decision. Adjuvant radiation is also planned.   CANCER HISTORY:  Oncology History  Malignant neoplasm of lower-outer quadrant of right breast of female, estrogen receptor positive ([place])  01/20/2021 Cancer Staging   Staging form: Breast, AJCC 8th Edition - Clinical stage from 01/20/2021: Stage IA (cT1c, cN0, cM0, G2, ER+, PR-, HER2-) - Signed by Chauncey Cruel, MD on 01/25/2021 Stage prefix: Initial diagnosis Histologic grading system: 3 grade system   01/23/2021 Initial Diagnosis   Malignant neoplasm of lower-outer quadrant of right breast of  female, estrogen receptor positive ([place])   02/09/2021 -  Chemotherapy    Patient is on Treatment Plan: BREAST PEMBROLIZUMAB + CARBOPLATIN D1 + PACLITAXEL D1,8,15 Q21D X 4 CYCLES / PEMBROLIZUMAB + AC Q21D X 4 CYCLES      Malignant neoplasm of overlapping sites of left breast in female, estrogen receptor negative ([place])  01/20/2021 Cancer Staging   Staging form: Breast, AJCC 8th Edition - Clinical stage from 01/20/2021: Stage IIIB (cT3, cN0, cM0, G3, ER-, PR-, HER2-) - Signed by Chauncey Cruel, MD on 01/25/2021 Histologic grading system: 3 grade system   01/25/2021 Initial Diagnosis   Malignant neoplasm of overlapping sites of left breast in female, estrogen receptor negative ([place])   02/09/2021 -  Chemotherapy    Patient is on Treatment Plan: BREAST PEMBROLIZUMAB + CARBOPLATIN D1 + PACLITAXEL D1,8,15 Q21D X 4 CYCLES / PEMBROLIZUMAB + AC Q21D X 4 CYCLES         RISK FACTORS:  Menarche was at age 53.  Ovaries intact: yes.  Hysterectomy: no.  Menopausal status: postmenopausal.  HRT: no Mammogram within the last year: yes.   Past Medical History:  Diagnosis Date  . Acid reflux   . Anemia   . Arthritis    "maybe in my fingers" (03/26/2018)  . Breast cancer ([place]) 12/2020  . Family history of adverse reaction to anesthesia    "brother, Otila Kluver, and my mom always get sick" (03/26/2018)  . Family history of brain cancer   . Family history of breast cancer   . Family history of colon cancer   .  Family history of lung cancer   . Family history of pancreatic cancer   . Family history of prostate cancer   . Gallbladder problem   . High blood pressure   . High cholesterol   . Hip pain    "left; before OR" (03/26/2018)  . History of kidney stones   . OSA (obstructive sleep apnea)    "can't tolerate the mask" (03/26/2018)  . Pre-diabetes   . Shortness of breath   . Swelling of lower extremity    "that's why they put me on fluid pill" (03/26/2018)    Past Surgical History:   Procedure Laterality Date  . CARPAL TUNNEL RELEASE Right ~ 2006  . EYE SURGERY    . JOINT REPLACEMENT    . LAPAROSCOPIC CHOLECYSTECTOMY  12/2010  . LASIK Bilateral 2000  . TOTAL HIP ARTHROPLASTY Left 03/25/2018   Procedure: LEFT TOTAL HIP ARTHROPLASTY ANTERIOR APPROACH;  Surgeon: Mcarthur Rossetti, MD;  Location: [facility];  Service: Orthopedics;  Laterality: Left;    Social History   Socioeconomic History  . Marital status: Single    Spouse name: Not on file  . Number of children: Not on file  . Years of education: Not on file  . Highest education level: Not on file  Occupational History  . Occupation: Sport and exercise psychologist: [employer]  Tobacco Use  . Smoking status: Never Smoker  . Smokeless tobacco: Never Used  Vaping Use  . Vaping Use: Never used  Substance and Sexual Activity  . Alcohol use: Not Currently  . Drug use: Never  . Sexual activity: Not Currently  Other Topics Concern  . Not on file  Social History Narrative  . Not on file   Social Determinants of Health   Financial Resource Strain: Not on file  Food Insecurity: Not on file  Transportation Needs: Not on file  Physical Activity: Not on file  Stress: Not on file  Social Connections: Not on file     FAMILY HISTORY:  We obtained a detailed, 4-generation family history.  Significant diagnoses are listed below: Family History  Problem Relation Age of Onset  . Diabetes Mother   . Thyroid disease Mother   . Liver disease Mother   . Obesity Mother   . Hypertension Father   . Hyperlipidemia Father   . Heart disease Father   . Stroke Father   . Obesity Father   . Colon polyps Father   . Prostate cancer Brother   . Brain cancer Maternal Grandfather   . Pancreatic cancer Paternal Grandmother   . Colon cancer Neg Hx        No one in family with colon cancer.   Leslie Wright has 3 brothers. One had prostate cancer at 23.  Leslie Wright mother died at 72. Patient had 4 maternal aunts, 4 uncles. One aunt  had breast cancer at 83 and died at 86. No known cancers in maternal cousins. Maternal grandmother died at 30. Grandfather had brain cancer at 58 and died at 21.  Leslie Wright father is living at 13, no history of cancer. Patient had 4 paternal uncles and 1 aunt. An uncle had pancreatic cancer at 102 and died at 36. Another uncle had lung cancer and history of smoking. Her aunt had colon cancer in her 64s. Paternal grandmother had pancreatic cancer as well and died at 82. Grandfather had lung cancer and history of smoking.   Leslie Wright is unaware of previous family history of genetic testing  for hereditary cancer risks. Patient's maternal ancestors are of American descent, and paternal ancestors are of American descent. There is no reported Ashkenazi Jewish ancestry. There is no known consanguinity.    GENETIC COUNSELING ASSESSMENT: Leslie Wright is a 55 y.o. female with a personal history of breast cancer and family history of pancreatic and other cancers which is somewhat suggestive of a hereditary cancer syndrome and predisposition to cancer. We, therefore, discussed and recommended the following at today's visit.   DISCUSSION: We discussed that approximately 5-10% of breast cancer is hereditary  Most cases of hereditary breast cancer are associated with BRCA1/BRCA2 genes, although there are other genes associated with hereditary breast, pancreatic, prostate cancer as well. There are other genes associated with other cancers in the family that we can test for, risks and cancers are gene specific .  We discussed that testing is beneficial for several reasons including surgical decision-making for breast cancer, knowing about other cancer risks, identifying potential screening and risk-reduction options that may be appropriate, and to understand if other family members could be at risk for cancer and allow them to undergo genetic testing.   We reviewed the characteristics, features and inheritance patterns of  hereditary cancer syndromes. We also discussed genetic testing, including the appropriate family members to test, the process of testing, insurance coverage and turn-around-time for results. We discussed the implications of a negative, positive and/or variant of uncertain significant result. We recommended Leslie Wright pursue genetic testing for the [facility] Multi-Cancer Panel+RNA gene panel.   The Multi-Cancer Panel + RNA offered by [facility] includes sequencing and/or deletion duplication testing of the following 84 genes: AIP, ALK, APC, ATM, AXIN2,BAP1,  BARD1, BLM, BMPR1A, BRCA1, BRCA2, BRIP1, CASR, CDC73, CDH1, CDK4, CDKN1B, CDKN1C, CDKN2A (p14ARF), CDKN2A (p16INK4a), CEBPA, CHEK2, CTNNA1, DICER1, DIS3L2, EGFR (c.2369C>T, p.Thr790Met variant only), EPCAM (Deletion/duplication testing only), FH, FLCN, GATA2, GPC3, GREM1 (Promoter region deletion/duplication testing only), HOXB13 (c.251G>A, p.Gly84Glu), HRAS, KIT, MAX, MEN1, MET, MITF (c.952G>A, p.Glu318Lys variant only), MLH1, MSH2, MSH3, MSH6, MUTYH, NBN, NF1, NF2, NTHL1, PALB2, PDGFRA, PHOX2B, PMS2, POLD1, POLE, POT1, PRKAR1A, PTCH1, PTEN, RAD50, RAD51C, RAD51D, RB1, RECQL4, RET, RUNX1, SDHAF2, SDHA (sequence changes only), SDHB, SDHC, SDHD, SMAD4, SMARCA4, SMARCB1, SMARCE1, STK11, SUFU, TERC, TERT, TMEM127, TP53, TSC1, TSC2, VHL, WRN and WT1.  Based on Ms. Ganesh personal and family history of cancer, she meets medical criteria for genetic testing. Despite that she meets criteria, she may still have an out of pocket cost. We discussed that if her out of pocket cost for testing is over $100, the laboratory will call and confirm whether she wants to proceed with testing.  If the out of pocket cost of testing is less than $100 she will be billed by the genetic testing laboratory.   PLAN: After considering the risks, benefits, and limitations, Leslie Wright provided informed consent to pursue genetic testing and the blood sample was sent to [facility] for  analysis of the Multi-Cancer Panel+RNA. Results should be available within approximately 2-3 weeks' time, at which point they will be disclosed by telephone to Leslie Wright, as will any additional recommendations warranted by these results. Leslie Wright will receive a summary of her genetic counseling visit and a copy of her results once available. This information will also be available in Epic.   Leslie Wright questions were answered to her satisfaction today. Our contact information was provided should additional questions or concerns arise. Thank you for the referral and allowing [place] to share in the care of your patient.  Faith Rogue, MS, [facility] Genetic Counselor [email or web address] Phone: [phone]  The patient was seen for a total of 30 minutes in face-to-face genetic counseling.  Dr. Grayland Ormond was available for discussion regarding this case.   _______________________________________________________________________ For Office Staff:  Number of people involved in session: 1 Was an Intern/ student involved with case: no

## 2021-01-31 ENCOUNTER — Encounter: Payer: Self-pay | Admitting: *Deleted

## 2021-01-31 ENCOUNTER — Telehealth: Payer: Self-pay | Admitting: Oncology

## 2021-01-31 NOTE — Telephone Encounter (Signed)
Scheduled appts per 5/3 sch msg. Pt aware.

## 2021-02-01 ENCOUNTER — Ambulatory Visit
Admission: RE | Admit: 2021-02-01 | Discharge: 2021-02-01 | Disposition: A | Discharge status: Home / Self Care | Payer: 59 | Source: Ambulatory Visit | Attending: General Surgery | Admitting: General Surgery

## 2021-02-01 ENCOUNTER — Other Ambulatory Visit: Payer: Self-pay

## 2021-02-01 DIAGNOSIS — C50511 Malignant neoplasm of lower-outer quadrant of right female breast: Secondary | ICD-10-CM

## 2021-02-01 DIAGNOSIS — Z170 Estrogen receptor positive status [ER+]: Secondary | ICD-10-CM

## 2021-02-01 DIAGNOSIS — C50811 Malignant neoplasm of overlapping sites of right female breast: Secondary | ICD-10-CM | POA: Diagnosis not present

## 2021-02-01 DIAGNOSIS — C50412 Malignant neoplasm of upper-outer quadrant of left female breast: Secondary | ICD-10-CM | POA: Diagnosis not present

## 2021-02-01 MED ORDER — GADOBUTROL 1 MMOL/ML IV SOLN
10.0000 mL | Freq: Once | INTRAVENOUS | Status: AC | PRN
  Administered 2021-02-01: 10 mL via INTRAVENOUS

## 2021-02-02 ENCOUNTER — Encounter: Payer: Self-pay | Admitting: *Deleted

## 2021-02-03 ENCOUNTER — Other Ambulatory Visit (HOSPITAL_COMMUNITY): Payer: Self-pay

## 2021-02-03 ENCOUNTER — Other Ambulatory Visit: Payer: Self-pay | Admitting: General Surgery

## 2021-02-03 ENCOUNTER — Telehealth: Payer: Self-pay | Admitting: *Deleted

## 2021-02-03 ENCOUNTER — Telehealth: Payer: Self-pay | Admitting: Oncology

## 2021-02-03 DIAGNOSIS — R9389 Abnormal findings on diagnostic imaging of other specified body structures: Secondary | ICD-10-CM

## 2021-02-03 DIAGNOSIS — N390 Urinary tract infection, site not specified: Secondary | ICD-10-CM | POA: Diagnosis not present

## 2021-02-03 DIAGNOSIS — C50919 Malignant neoplasm of unspecified site of unspecified female breast: Secondary | ICD-10-CM | POA: Diagnosis not present

## 2021-02-03 DIAGNOSIS — N3000 Acute cystitis without hematuria: Secondary | ICD-10-CM | POA: Diagnosis not present

## 2021-02-03 DIAGNOSIS — I10 Essential (primary) hypertension: Secondary | ICD-10-CM | POA: Diagnosis not present

## 2021-02-03 MED ORDER — LOSARTAN POTASSIUM 100 MG PO TABS
ORAL_TABLET | ORAL | 3 refills | Status: DC
  Filled 2021-02-03: qty 90, 90d supply, fill #0

## 2021-02-03 MED ORDER — CIPROFLOXACIN HCL 500 MG PO TABS
ORAL_TABLET | ORAL | 0 refills | Status: DC
  Filled 2021-02-03: qty 6, 3d supply, fill #0

## 2021-02-03 NOTE — Telephone Encounter (Signed)
Pt called stating she is having an additional biopsy on 2/95 that conflicts with appts for Chemo Edu and Echo.  Informed pt urgent request sent to scheduling per above with goal to reschedule prior to start of chemo on 02/10/2021.  This RN sent Urgent scheduling request per above.

## 2021-02-03 NOTE — Telephone Encounter (Signed)
R/s appt per 5/6 sch msg. Pt aware.  

## 2021-02-06 ENCOUNTER — Other Ambulatory Visit (HOSPITAL_COMMUNITY)
Admission: RE | Admit: 2021-02-06 | Discharge: 2021-02-06 | Disposition: A | Discharge status: Home / Self Care | Payer: 59 | Source: Ambulatory Visit | Attending: General Surgery | Admitting: General Surgery

## 2021-02-06 ENCOUNTER — Ambulatory Visit (HOSPITAL_COMMUNITY)
Admission: RE | Admit: 2021-02-06 | Discharge: 2021-02-06 | Disposition: A | Discharge status: Home / Self Care | Payer: 59 | Source: Ambulatory Visit | Attending: Oncology | Admitting: Oncology

## 2021-02-06 ENCOUNTER — Other Ambulatory Visit: Payer: Self-pay

## 2021-02-06 ENCOUNTER — Encounter (HOSPITAL_COMMUNITY)
Admission: RE | Admit: 2021-02-06 | Discharge: 2021-02-06 | Disposition: A | Discharge status: Home / Self Care | Payer: 59 | Source: Ambulatory Visit | Attending: Oncology | Admitting: Oncology

## 2021-02-06 DIAGNOSIS — C50912 Malignant neoplasm of unspecified site of left female breast: Secondary | ICD-10-CM | POA: Diagnosis not present

## 2021-02-06 DIAGNOSIS — Z20822 Contact with and (suspected) exposure to covid-19: Secondary | ICD-10-CM | POA: Insufficient documentation

## 2021-02-06 DIAGNOSIS — Z170 Estrogen receptor positive status [ER+]: Secondary | ICD-10-CM | POA: Diagnosis not present

## 2021-02-06 DIAGNOSIS — Z01812 Encounter for preprocedural laboratory examination: Secondary | ICD-10-CM | POA: Insufficient documentation

## 2021-02-06 DIAGNOSIS — C50919 Malignant neoplasm of unspecified site of unspecified female breast: Secondary | ICD-10-CM | POA: Diagnosis not present

## 2021-02-06 DIAGNOSIS — C50812 Malignant neoplasm of overlapping sites of left female breast: Secondary | ICD-10-CM | POA: Insufficient documentation

## 2021-02-06 DIAGNOSIS — Z171 Estrogen receptor negative status [ER-]: Secondary | ICD-10-CM | POA: Diagnosis not present

## 2021-02-06 DIAGNOSIS — C50511 Malignant neoplasm of lower-outer quadrant of right female breast: Secondary | ICD-10-CM | POA: Diagnosis not present

## 2021-02-06 DIAGNOSIS — M5134 Other intervertebral disc degeneration, thoracic region: Secondary | ICD-10-CM | POA: Diagnosis not present

## 2021-02-06 DIAGNOSIS — C50911 Malignant neoplasm of unspecified site of right female breast: Secondary | ICD-10-CM | POA: Diagnosis not present

## 2021-02-06 DIAGNOSIS — N6320 Unspecified lump in the left breast, unspecified quadrant: Secondary | ICD-10-CM | POA: Diagnosis not present

## 2021-02-06 DIAGNOSIS — M19011 Primary osteoarthritis, right shoulder: Secondary | ICD-10-CM | POA: Diagnosis not present

## 2021-02-06 DIAGNOSIS — J8410 Pulmonary fibrosis, unspecified: Secondary | ICD-10-CM | POA: Diagnosis not present

## 2021-02-06 DIAGNOSIS — M19012 Primary osteoarthritis, left shoulder: Secondary | ICD-10-CM | POA: Diagnosis not present

## 2021-02-06 LAB — SARS CORONAVIRUS 2 (TAT 6-24 HRS): SARS Coronavirus 2: NEGATIVE

## 2021-02-06 MED ORDER — TECHNETIUM TC 99M MEDRONATE IV KIT
20.5000 | PACK | Freq: Once | INTRAVENOUS | Status: AC
  Administered 2021-02-06: 20.5 via INTRAVENOUS

## 2021-02-06 MED ORDER — SODIUM CHLORIDE (PF) 0.9 % IJ SOLN
INTRAMUSCULAR | Status: AC
  Filled 2021-02-06: qty 50

## 2021-02-06 MED ORDER — IOHEXOL 300 MG/ML  SOLN
75.0000 mL | Freq: Once | INTRAMUSCULAR | Status: AC | PRN
  Administered 2021-02-06: 75 mL via INTRAVENOUS

## 2021-02-06 NOTE — Progress Notes (Signed)
The following biosimilar Zarxio (filgrastim-sndz) has been selected for use in this patient per insurance.  Henreitta Leber, PharmD 02/06/21 @ [phone]

## 2021-02-06 NOTE — Progress Notes (Addendum)
COVID Vaccine Completed: X2 Date COVID Vaccine completed: 06-05-20, 06-26-20 Has received booster: COVID vaccine manufacturer: [place]   Date of COVID positive in last 90 days: N/A  PCP - Rachell Cipro, MD Cardiologist - Kate Sable, MD  Chest x-ray - CT chest 02-06-21 Epic EKG - 02-07-21 Epic Stress Test - N/A ECHO - 02-07-21 Epic Cardiac Cath - N/A Pacemaker/ICD device last checked: Spinal Cord Stimulator: Cardiac CT - 02-19-19 Epic  Sleep Study - yes, +sleep apnea CPAP - No  Fasting Blood Sugar - prediabetes, does not check Checks Blood Sugar _____ times a day  Blood Thinner Instructions: Aspirin Instructions:  ASA 81 mg.    Last Dose:  02-06-21  Activity level:  Can go up a flight of stairs and perform activities of daily living without chest pain.  Patient is able to climb a flight of stairs without stopping but will have shortness of breath.   Can do housework but has to take breaks due to shortness of breath.   She states that this has been an ongoing issue for over two years.     Anesthesia review: Hx of dyspnea on exertion, leg swelling, HTN, pre diabetes.  Patient states that the leg swelling has improved, she wears TED hose.    Patient denies fever, cough and chest pain at PAT appointment.  Patient denies shortness of breath at PAT and no shortness of breath noted.    Patient verbalized understanding of instructions that were given to them at the PAT appointment. Patient was also instructed that they will need to review over the PAT instructions again at home before surgery.

## 2021-02-06 NOTE — Patient Instructions (Addendum)
DUE TO COVID-19 ONLY ONE VISITOR IS ALLOWED TO COME WITH YOU AND STAY IN THE WAITING ROOM ONLY DURING PRE OP AND PROCEDURE.   **NO VISITORS ARE ALLOWED IN THE SHORT STAY AREA OR RECOVERY ROOM!!**        Your procedure is scheduled on:  Wednesday, 02-08-21   Report to [facility] Main  Entrance   Report to admitting at 6:30 AM   Call this number if you have problems the morning of surgery [phone]   Do not eat food :After Midnight.   May have liquids until 5:30 AM day of surgery  CLEAR LIQUID DIET  Foods Allowed                                                                     Foods Excluded  Water, Black Coffee and tea, regular and decaf              liquids that you cannot  Plain Jell-O in any flavor  (No red)                                    see through such as: Fruit ices (not with fruit pulp)                                      milk, soups, orange juice              Iced Popsicles (No red)                                      All solid food                                   Apple juices Sports drinks like Gatorade (No red) Lightly seasoned clear broth or consume(fat free) Sugar, honey syrup     Oral Hygiene is also important to reduce your risk of infection.                                    Remember - BRUSH YOUR TEETH THE MORNING OF SURGERY WITH YOUR REGULAR TOOTHPASTE   Do NOT smoke after Midnight   Take these medicines the morning of surgery with A SIP OF WATER:  None  DO NOT TAKE ANY ORAL DIABETIC MEDICATIONS DAY OF YOUR SURGERY                               You may not have any metal on your body including hair pins, jewelry, and body piercings             Do not wear make-up, lotions, powders, perfumes/cologne, or deodorant             Do not wear nail polish.  Do not shave  48 hours  prior to surgery.            Do not bring valuables to the hospital. [place].   Contacts, dentures or bridgework may not  be worn into surgery.   Patient's discharged home cannot drive themselves home after surgery                Please read over the following fact sheets you were given: IF YOU HAVE QUESTIONS ABOUT YOUR PRE OP INSTRUCTIONS PLEASE CALL  [facility] - Preparing for Surgery Before surgery, you can play an important role.  Because skin is not sterile, your skin needs to be as free of germs as possible.  You can reduce the number of germs on your skin by washing with CHG (chlorahexidine gluconate) soap before surgery.  CHG is an antiseptic cleaner which kills germs and bonds with the skin to continue killing germs even after washing. Please DO NOT use if you have an allergy to CHG or antibacterial soaps.  If your skin becomes reddened/irritated stop using the CHG and inform your nurse when you arrive at Short Stay. Do not shave (including legs and underarms) for at least 48 hours prior to the first CHG shower.  You may shave your face/neck.  Please follow these instructions carefully:  1.  Shower with CHG Soap the night before surgery and the  morning of surgery.  2.  If you choose to wash your hair, wash your hair first as usual with your normal  shampoo.  3.  After you shampoo, rinse your hair and body thoroughly to remove the shampoo.                             4.  Use CHG as you would any other liquid soap.  You can apply chg directly to the skin and wash.  Gently with a scrungie or clean washcloth.  5.  Apply the CHG Soap to your body ONLY FROM THE NECK DOWN.   Do   not use on face/ open                           Wound or open sores. Avoid contact with eyes, ears mouth and   genitals (private parts).                       Wash face,  Genitals (private parts) with your normal soap.             6.  Wash thoroughly, paying special attention to the area where your    surgery  will be performed.  7.  Thoroughly rinse your body with warm water from the neck down.  8.  DO NOT  shower/wash with your normal soap after using and rinsing off the CHG Soap.                9.  Pat yourself dry with a clean towel.            10.  Wear clean pajamas.            11.  Place clean sheets on your bed the night of your first shower and do not  sleep with pets. Day of Surgery : Do not apply any lotions/deodorants the morning of surgery.  Please wear clean clothes to the  hospital/surgery center.  FAILURE TO FOLLOW THESE INSTRUCTIONS MAY RESULT IN THE CANCELLATION OF YOUR SURGERY  PATIENT SIGNATURE_________________________________  NURSE SIGNATURE__________________________________  ________________________________________________________________________

## 2021-02-07 ENCOUNTER — Other Ambulatory Visit: Payer: 59

## 2021-02-07 ENCOUNTER — Ambulatory Visit
Admission: RE | Admit: 2021-02-07 | Discharge: 2021-02-07 | Disposition: A | Discharge status: Home / Self Care | Payer: 59 | Source: Ambulatory Visit | Attending: General Surgery | Admitting: General Surgery

## 2021-02-07 ENCOUNTER — Encounter (HOSPITAL_COMMUNITY)
Admission: RE | Admit: 2021-02-07 | Discharge: 2021-02-07 | Disposition: A | Discharge status: Home / Self Care | Payer: 59 | Source: Ambulatory Visit | Attending: Oncology | Admitting: Oncology

## 2021-02-07 ENCOUNTER — Other Ambulatory Visit (HOSPITAL_COMMUNITY): Payer: 59

## 2021-02-07 ENCOUNTER — Ambulatory Visit (HOSPITAL_COMMUNITY)
Admission: RE | Admit: 2021-02-07 | Discharge: 2021-02-07 | Disposition: A | Discharge status: Home / Self Care | Payer: 59 | Source: Ambulatory Visit | Attending: Oncology | Admitting: Oncology

## 2021-02-07 ENCOUNTER — Encounter (HOSPITAL_COMMUNITY): Payer: Self-pay

## 2021-02-07 ENCOUNTER — Encounter: Payer: Self-pay | Admitting: *Deleted

## 2021-02-07 DIAGNOSIS — R9389 Abnormal findings on diagnostic imaging of other specified body structures: Secondary | ICD-10-CM

## 2021-02-07 DIAGNOSIS — Z171 Estrogen receptor negative status [ER-]: Secondary | ICD-10-CM | POA: Insufficient documentation

## 2021-02-07 DIAGNOSIS — I10 Essential (primary) hypertension: Secondary | ICD-10-CM | POA: Insufficient documentation

## 2021-02-07 DIAGNOSIS — N6489 Other specified disorders of breast: Secondary | ICD-10-CM | POA: Diagnosis not present

## 2021-02-07 DIAGNOSIS — N6312 Unspecified lump in the right breast, upper inner quadrant: Secondary | ICD-10-CM | POA: Diagnosis not present

## 2021-02-07 DIAGNOSIS — C50812 Malignant neoplasm of overlapping sites of left female breast: Secondary | ICD-10-CM | POA: Diagnosis not present

## 2021-02-07 DIAGNOSIS — Z0189 Encounter for other specified special examinations: Secondary | ICD-10-CM

## 2021-02-07 DIAGNOSIS — Z01818 Encounter for other preprocedural examination: Secondary | ICD-10-CM | POA: Insufficient documentation

## 2021-02-07 HISTORY — DX: Headache, unspecified: R51.9

## 2021-02-07 HISTORY — DX: Nausea with vomiting, unspecified: R11.2

## 2021-02-07 HISTORY — DX: Other specified postprocedural states: Z98.890

## 2021-02-07 LAB — ECHOCARDIOGRAM COMPLETE
Area-P 1/2: 4.31 cm2
S' Lateral: 2.8 cm

## 2021-02-07 LAB — HEMOGLOBIN A1C
Hgb A1c MFr Bld: 6.9 % — ABNORMAL HIGH (ref 4.8–5.6)
Mean Plasma Glucose: 151.33 mg/dL

## 2021-02-07 NOTE — Progress Notes (Signed)
  Echocardiogram 2D Echocardiogram has been performed.  Leslie Wright 02/07/2021, 1:45 PM

## 2021-02-08 ENCOUNTER — Ambulatory Visit (HOSPITAL_COMMUNITY): Payer: 59

## 2021-02-08 ENCOUNTER — Ambulatory Visit (HOSPITAL_COMMUNITY)
Admission: RE | Admit: 2021-02-08 | Discharge: 2021-02-08 | Disposition: A | Discharge status: Home / Self Care | Payer: 59 | Source: Ambulatory Visit | Attending: General Surgery | Admitting: General Surgery

## 2021-02-08 ENCOUNTER — Ambulatory Visit (HOSPITAL_COMMUNITY): Payer: 59 | Admitting: Physician Assistant

## 2021-02-08 ENCOUNTER — Inpatient Hospital Stay: Payer: 59

## 2021-02-08 ENCOUNTER — Other Ambulatory Visit: Payer: 59

## 2021-02-08 ENCOUNTER — Encounter (HOSPITAL_COMMUNITY): Payer: Self-pay | Admitting: General Surgery

## 2021-02-08 ENCOUNTER — Other Ambulatory Visit: Payer: Self-pay

## 2021-02-08 ENCOUNTER — Other Ambulatory Visit (HOSPITAL_COMMUNITY): Payer: Self-pay

## 2021-02-08 ENCOUNTER — Encounter (HOSPITAL_COMMUNITY)
Admission: RE | Disposition: A | Discharge status: Home / Self Care | Payer: Self-pay | Source: Ambulatory Visit | Attending: General Surgery

## 2021-02-08 ENCOUNTER — Encounter: Payer: Self-pay | Admitting: Oncology

## 2021-02-08 ENCOUNTER — Other Ambulatory Visit: Payer: Self-pay | Admitting: Oncology

## 2021-02-08 DIAGNOSIS — Z79899 Other long term (current) drug therapy: Secondary | ICD-10-CM | POA: Diagnosis not present

## 2021-02-08 DIAGNOSIS — C50812 Malignant neoplasm of overlapping sites of left female breast: Secondary | ICD-10-CM

## 2021-02-08 DIAGNOSIS — Z96642 Presence of left artificial hip joint: Secondary | ICD-10-CM | POA: Insufficient documentation

## 2021-02-08 DIAGNOSIS — E7800 Pure hypercholesterolemia, unspecified: Secondary | ICD-10-CM | POA: Diagnosis not present

## 2021-02-08 DIAGNOSIS — E559 Vitamin D deficiency, unspecified: Secondary | ICD-10-CM | POA: Diagnosis not present

## 2021-02-08 DIAGNOSIS — C50911 Malignant neoplasm of unspecified site of right female breast: Secondary | ICD-10-CM | POA: Diagnosis not present

## 2021-02-08 DIAGNOSIS — Z8042 Family history of malignant neoplasm of prostate: Secondary | ICD-10-CM | POA: Insufficient documentation

## 2021-02-08 DIAGNOSIS — Z808 Family history of malignant neoplasm of other organs or systems: Secondary | ICD-10-CM | POA: Insufficient documentation

## 2021-02-08 DIAGNOSIS — R7303 Prediabetes: Secondary | ICD-10-CM | POA: Diagnosis not present

## 2021-02-08 DIAGNOSIS — Z87891 Personal history of nicotine dependence: Secondary | ICD-10-CM | POA: Insufficient documentation

## 2021-02-08 DIAGNOSIS — C50511 Malignant neoplasm of lower-outer quadrant of right female breast: Secondary | ICD-10-CM | POA: Diagnosis not present

## 2021-02-08 DIAGNOSIS — C50211 Malignant neoplasm of upper-inner quadrant of right female breast: Secondary | ICD-10-CM | POA: Insufficient documentation

## 2021-02-08 DIAGNOSIS — Z833 Family history of diabetes mellitus: Secondary | ICD-10-CM | POA: Insufficient documentation

## 2021-02-08 DIAGNOSIS — Z9049 Acquired absence of other specified parts of digestive tract: Secondary | ICD-10-CM | POA: Insufficient documentation

## 2021-02-08 DIAGNOSIS — Z170 Estrogen receptor positive status [ER+]: Secondary | ICD-10-CM | POA: Diagnosis not present

## 2021-02-08 DIAGNOSIS — C50412 Malignant neoplasm of upper-outer quadrant of left female breast: Secondary | ICD-10-CM | POA: Diagnosis not present

## 2021-02-08 DIAGNOSIS — Z7982 Long term (current) use of aspirin: Secondary | ICD-10-CM | POA: Diagnosis not present

## 2021-02-08 DIAGNOSIS — Z800 Family history of malignant neoplasm of digestive organs: Secondary | ICD-10-CM | POA: Insufficient documentation

## 2021-02-08 DIAGNOSIS — Z171 Estrogen receptor negative status [ER-]: Secondary | ICD-10-CM

## 2021-02-08 DIAGNOSIS — C50912 Malignant neoplasm of unspecified site of left female breast: Secondary | ICD-10-CM | POA: Diagnosis not present

## 2021-02-08 DIAGNOSIS — Z95828 Presence of other vascular implants and grafts: Secondary | ICD-10-CM

## 2021-02-08 DIAGNOSIS — Z452 Encounter for adjustment and management of vascular access device: Secondary | ICD-10-CM | POA: Diagnosis not present

## 2021-02-08 HISTORY — PX: PORTACATH PLACEMENT: SHX2246

## 2021-02-08 LAB — GLUCOSE, CAPILLARY: Glucose-Capillary: 128 mg/dL — ABNORMAL HIGH (ref 70–99)

## 2021-02-08 SURGERY — INSERTION, TUNNELED CENTRAL VENOUS DEVICE, WITH PORT
Anesthesia: General | Site: Breast

## 2021-02-08 MED ORDER — ONDANSETRON HCL 8 MG PO TABS
8.0000 mg | ORAL_TABLET | Freq: Two times a day (BID) | ORAL | 1 refills | Status: DC | PRN
  Filled 2021-02-08: qty 30, 15d supply, fill #0
  Filled 2021-05-05: qty 30, 15d supply, fill #1

## 2021-02-08 MED ORDER — ONDANSETRON HCL 4 MG/2ML IJ SOLN
INTRAMUSCULAR | Status: AC
  Filled 2021-02-08: qty 2

## 2021-02-08 MED ORDER — SUGAMMADEX SODIUM 200 MG/2ML IV SOLN
INTRAVENOUS | Status: DC | PRN
  Administered 2021-02-08: 400 mg via INTRAVENOUS

## 2021-02-08 MED ORDER — ACETAMINOPHEN 500 MG PO TABS
1000.0000 mg | ORAL_TABLET | ORAL | Status: AC
  Administered 2021-02-08: 1000 mg via ORAL
  Filled 2021-02-08: qty 2

## 2021-02-08 MED ORDER — LIDOCAINE 2% (20 MG/ML) 5 ML SYRINGE
INTRAMUSCULAR | Status: DC | PRN
  Administered 2021-02-08: 40 mg via INTRAVENOUS

## 2021-02-08 MED ORDER — OXYCODONE HCL 5 MG/5ML PO SOLN: 5.0000 mg | Freq: Once | ORAL | Status: DC | PRN

## 2021-02-08 MED ORDER — OXYCODONE HCL 5 MG PO TABS: 5.0000 mg | ORAL_TABLET | Freq: Once | ORAL | Status: DC | PRN

## 2021-02-08 MED ORDER — LIDOCAINE 2% (20 MG/ML) 5 ML SYRINGE
INTRAMUSCULAR | Status: AC
  Filled 2021-02-08: qty 5

## 2021-02-08 MED ORDER — CHLORHEXIDINE GLUCONATE 0.12 % MT SOLN
15.0000 mL | Freq: Once | OROMUCOSAL | Status: AC
  Administered 2021-02-08: 15 mL via OROMUCOSAL

## 2021-02-08 MED ORDER — EPHEDRINE SULFATE-NACL 50-0.9 MG/10ML-% IV SOSY
PREFILLED_SYRINGE | INTRAVENOUS | Status: DC | PRN
  Administered 2021-02-08 (×4): 10 mg via INTRAVENOUS

## 2021-02-08 MED ORDER — MIDAZOLAM HCL 2 MG/2ML IJ SOLN
INTRAMUSCULAR | Status: AC
  Filled 2021-02-08: qty 2

## 2021-02-08 MED ORDER — ACETAMINOPHEN 325 MG PO TABS: 325.0000 mg | ORAL_TABLET | ORAL | Status: DC | PRN

## 2021-02-08 MED ORDER — FENTANYL CITRATE (PF) 100 MCG/2ML IJ SOLN
INTRAMUSCULAR | Status: DC | PRN
  Administered 2021-02-08: 100 ug via INTRAVENOUS

## 2021-02-08 MED ORDER — EPHEDRINE 5 MG/ML INJ
INTRAVENOUS | Status: AC
  Filled 2021-02-08: qty 10

## 2021-02-08 MED ORDER — PROPOFOL 10 MG/ML IV BOLUS
INTRAVENOUS | Status: DC | PRN
  Administered 2021-02-08: 150 mg via INTRAVENOUS

## 2021-02-08 MED ORDER — CHLORHEXIDINE GLUCONATE CLOTH 2 % EX PADS: 6.0000 | MEDICATED_PAD | Freq: Once | CUTANEOUS | Status: DC

## 2021-02-08 MED ORDER — FENTANYL CITRATE (PF) 100 MCG/2ML IJ SOLN
INTRAMUSCULAR | Status: AC
  Filled 2021-02-08: qty 2

## 2021-02-08 MED ORDER — SUGAMMADEX SODIUM 500 MG/5ML IV SOLN
INTRAVENOUS | Status: AC
  Filled 2021-02-08: qty 5

## 2021-02-08 MED ORDER — OXYCODONE HCL 5 MG PO TABS
5.0000 mg | ORAL_TABLET | Freq: Four times a day (QID) | ORAL | 0 refills | Status: DC | PRN
  Filled 2021-02-08: qty 8, 2d supply, fill #0

## 2021-02-08 MED ORDER — LIDOCAINE HCL 1 % IJ SOLN
INTRAMUSCULAR | Status: DC | PRN
  Administered 2021-02-08: 10 mL

## 2021-02-08 MED ORDER — ROCURONIUM BROMIDE 10 MG/ML (PF) SYRINGE
PREFILLED_SYRINGE | INTRAVENOUS | Status: DC | PRN
  Administered 2021-02-08 (×3): 10 mg via INTRAVENOUS
  Administered 2021-02-08: 60 mg via INTRAVENOUS

## 2021-02-08 MED ORDER — PROCHLORPERAZINE MALEATE 10 MG PO TABS
10.0000 mg | ORAL_TABLET | Freq: Four times a day (QID) | ORAL | 1 refills | Status: DC | PRN
  Filled 2021-02-08: qty 30, 8d supply, fill #0
  Filled 2021-03-17: qty 30, 8d supply, fill #1

## 2021-02-08 MED ORDER — DEXAMETHASONE 4 MG PO TABS
ORAL_TABLET | ORAL | 1 refills | Status: DC
  Filled 2021-02-08: qty 30, 5d supply, fill #0
  Filled 2021-05-05: qty 30, 15d supply, fill #1

## 2021-02-08 MED ORDER — ACETAMINOPHEN 10 MG/ML IV SOLN: 1000.0000 mg | Freq: Once | INTRAVENOUS | Status: DC | PRN

## 2021-02-08 MED ORDER — PROPOFOL 10 MG/ML IV BOLUS
INTRAVENOUS | Status: AC
  Filled 2021-02-08: qty 20

## 2021-02-08 MED ORDER — MIDAZOLAM HCL 2 MG/2ML IJ SOLN
INTRAMUSCULAR | Status: DC | PRN
  Administered 2021-02-08: 2 mg via INTRAVENOUS

## 2021-02-08 MED ORDER — FENTANYL CITRATE (PF) 100 MCG/2ML IJ SOLN: 25.0000 ug | INTRAMUSCULAR | Status: DC | PRN

## 2021-02-08 MED ORDER — HEPARIN SOD (PORK) LOCK FLUSH 100 UNIT/ML IV SOLN
INTRAVENOUS | Status: DC | PRN
  Administered 2021-02-08: 500 [IU]

## 2021-02-08 MED ORDER — LACTATED RINGERS IV SOLN: INTRAVENOUS | Status: DC

## 2021-02-08 MED ORDER — LIDOCAINE HCL (PF) 1 % IJ SOLN
INTRAMUSCULAR | Status: AC
  Filled 2021-02-08: qty 30

## 2021-02-08 MED ORDER — ONDANSETRON HCL 4 MG/2ML IJ SOLN
INTRAMUSCULAR | Status: DC | PRN
  Administered 2021-02-08: 4 mg via INTRAVENOUS

## 2021-02-08 MED ORDER — ACETAMINOPHEN 160 MG/5ML PO SOLN
325.0000 mg | ORAL | Status: DC | PRN
Start: 2021-02-08 — End: 2021-02-08

## 2021-02-08 MED ORDER — CEFAZOLIN IN SODIUM CHLORIDE 3-0.9 GM/100ML-% IV SOLN
3.0000 g | INTRAVENOUS | Status: AC
  Administered 2021-02-08: 3 g via INTRAVENOUS
  Filled 2021-02-08: qty 100

## 2021-02-08 MED ORDER — BUPIVACAINE-EPINEPHRINE 0.25% -1:200000 IJ SOLN
INTRAMUSCULAR | Status: DC | PRN
  Administered 2021-02-08: 10 mL

## 2021-02-08 MED ORDER — HEPARIN SOD (PORK) LOCK FLUSH 100 UNIT/ML IV SOLN
INTRAVENOUS | Status: AC
  Filled 2021-02-08: qty 5

## 2021-02-08 MED ORDER — BUPIVACAINE-EPINEPHRINE (PF) 0.25% -1:200000 IJ SOLN
INTRAMUSCULAR | Status: AC
  Filled 2021-02-08: qty 30

## 2021-02-08 MED ORDER — AMISULPRIDE (ANTIEMETIC) 5 MG/2ML IV SOLN: 10.0000 mg | Freq: Once | INTRAVENOUS | Status: DC | PRN

## 2021-02-08 MED ORDER — DEXAMETHASONE SODIUM PHOSPHATE 10 MG/ML IJ SOLN
INTRAMUSCULAR | Status: DC | PRN
  Administered 2021-02-08: 10 mg via INTRAVENOUS

## 2021-02-08 MED ORDER — ORAL CARE MOUTH RINSE: 15.0000 mL | Freq: Once | OROMUCOSAL | Status: AC

## 2021-02-08 MED ORDER — DEXAMETHASONE SODIUM PHOSPHATE 10 MG/ML IJ SOLN
INTRAMUSCULAR | Status: AC
  Filled 2021-02-08: qty 1

## 2021-02-08 MED ORDER — PROMETHAZINE HCL 25 MG/ML IJ SOLN
6.2500 mg | INTRAMUSCULAR | Status: DC | PRN
Start: 2021-02-08 — End: 2021-02-08

## 2021-02-08 MED ORDER — SODIUM CHLORIDE 0.9 % IV SOLN
Freq: Once | INTRAVENOUS | Status: AC
  Administered 2021-02-08: 50 mL
  Filled 2021-02-08: qty 1.2

## 2021-02-08 MED ORDER — SCOPOLAMINE 1 MG/3DAYS TD PT72: 1.0000 | MEDICATED_PATCH | TRANSDERMAL | Status: DC

## 2021-02-08 MED ORDER — LIDOCAINE-PRILOCAINE 2.5-2.5 % EX CREA
TOPICAL_CREAM | CUTANEOUS | 3 refills | Status: DC
  Filled 2021-02-08: qty 30, 30d supply, fill #0

## 2021-02-08 SURGICAL SUPPLY — 37 items
ADH SKN CLS APL DERMABOND .7 (GAUZE/BANDAGES/DRESSINGS) ×1
APL PRP STRL LF DISP 70% ISPRP (MISCELLANEOUS) ×1
BAG DECANTER FOR FLEXI CONT (MISCELLANEOUS) ×2 IMPLANT
BLADE HEX COATED 2.75 (ELECTRODE) ×2 IMPLANT
BLADE SURG 15 STRL LF DISP TIS (BLADE) ×1 IMPLANT
BLADE SURG 15 STRL SS (BLADE) ×2
BLADE SURG SZ11 CARB STEEL (BLADE) ×2 IMPLANT
CHLORAPREP W/TINT 26 (MISCELLANEOUS) ×2 IMPLANT
COVER SURGICAL LIGHT HANDLE (MISCELLANEOUS) ×2 IMPLANT
COVER WAND RF STERILE (DRAPES) IMPLANT
DECANTER SPIKE VIAL GLASS SM (MISCELLANEOUS) ×2 IMPLANT
DERMABOND ADVANCED (GAUZE/BANDAGES/DRESSINGS) ×1
DERMABOND ADVANCED .7 DNX12 (GAUZE/BANDAGES/DRESSINGS) ×1 IMPLANT
DRAPE C-ARM 42X120 X-RAY (DRAPES) ×2 IMPLANT
DRAPE LAPAROTOMY TRNSV 102X78 (DRAPES) ×2 IMPLANT
DRAPE UTILITY XL STRL (DRAPES) ×2 IMPLANT
ELECT PENCIL ROCKER SW 15FT (MISCELLANEOUS) ×1 IMPLANT
ELECT REM PT RETURN 15FT ADLT (MISCELLANEOUS) ×2 IMPLANT
GAUZE 4X4 16PLY RFD (DISPOSABLE) ×2 IMPLANT
GLOVE SURG ENC MOIS LTX SZ6 (GLOVE) ×2 IMPLANT
GLOVE SURG UNDER LTX SZ6.5 (GLOVE) ×2 IMPLANT
GOWN STRL REUS W/TWL 2XL LVL3 (GOWN DISPOSABLE) ×2 IMPLANT
GOWN STRL REUS W/TWL XL LVL3 (GOWN DISPOSABLE) ×2 IMPLANT
KIT BASIN OR (CUSTOM PROCEDURE TRAY) ×2 IMPLANT
KIT PORT POWER 8FR ISP CVUE (Port) ×1 IMPLANT
KIT TURNOVER KIT A (KITS) ×2 IMPLANT
NEEDLE HYPO 22GX1.5 SAFETY (NEEDLE) ×2 IMPLANT
PACK BASIC VI WITH GOWN DISP (CUSTOM PROCEDURE TRAY) ×2 IMPLANT
PENCIL SMOKE EVACUATOR (MISCELLANEOUS) IMPLANT
SUT MNCRL AB 4-0 PS2 18 (SUTURE) ×2 IMPLANT
SUT PROLENE 2 0 SH DA (SUTURE) ×4 IMPLANT
SUT VIC AB 3-0 SH 27 (SUTURE) ×2
SUT VIC AB 3-0 SH 27X BRD (SUTURE) ×1 IMPLANT
SYR 10ML LL (SYRINGE) ×2 IMPLANT
SYR CONTROL 10ML LL (SYRINGE) ×2 IMPLANT
TOWEL OR 17X26 10 PK STRL BLUE (TOWEL DISPOSABLE) ×2 IMPLANT
TOWEL OR NON WOVEN STRL DISP B (DISPOSABLE) ×2 IMPLANT

## 2021-02-08 NOTE — H&P (Signed)
Leslie Wright is an 55 y.o. female.   Chief Complaint: bilateral breast cancer HPI:  Pt presents with bilateral breast cancer.  The left side is relatively large.  She will need chemo and we will do neoadjuvant tx.    Past Medical History:  Diagnosis Date  . Acid reflux   . Anemia   . Arthritis    "maybe in my fingers" (03/26/2018)  . Breast cancer ([place]) 12/2020  . Family history of adverse reaction to anesthesia    "brother, Otila Kluver, and my mom always get sick" (03/26/2018)  . Family history of brain cancer   . Family history of breast cancer   . Family history of colon cancer   . Family history of lung cancer   . Family history of pancreatic cancer   . Family history of prostate cancer   . Gallbladder problem   . Headache   . High blood pressure   . High cholesterol   . Hip pain    "left; before OR" (03/26/2018)  . History of kidney stones   . OSA (obstructive sleep apnea)    "can't tolerate the mask" (03/26/2018)  . PONV (postoperative nausea and vomiting)   . Pre-diabetes   . Shortness of breath   . Swelling of lower extremity    "that's why they put me on fluid pill" (03/26/2018)    Past Surgical History:  Procedure Laterality Date  . CARPAL TUNNEL RELEASE Right ~ 2006  . EYE SURGERY    . JOINT REPLACEMENT    . LAPAROSCOPIC CHOLECYSTECTOMY  12/2010  . LASIK Bilateral 2000  . TOTAL HIP ARTHROPLASTY Left 03/25/2018   Procedure: LEFT TOTAL HIP ARTHROPLASTY ANTERIOR APPROACH;  Surgeon: Mcarthur Rossetti, MD;  Location: [facility];  Service: Orthopedics;  Laterality: Left;    Family History  Problem Relation Age of Onset  . Diabetes Mother   . Thyroid disease Mother   . Liver disease Mother   . Obesity Mother   . Hypertension Father   . Hyperlipidemia Father   . Heart disease Father   . Stroke Father   . Obesity Father   . Colon polyps Father   . Prostate cancer Brother   . Brain cancer Maternal Grandfather   . Pancreatic cancer Paternal Grandmother   . Colon  cancer Neg Hx        No one in family with colon cancer.   Social History:  reports that she has quit smoking. She has quit using smokeless tobacco.  Her smokeless tobacco use included chew. She reports current alcohol use. She reports that she does not use drugs.  Allergies: No Known Allergies  Medications Prior to Admission  Medication Sig Dispense Refill  . acetaminophen (TYLENOL) 500 MG tablet Take 1,000 mg by mouth every 6 (six) hours as needed for moderate pain.    Marland Kitchen aspirin EC 81 MG tablet Take 81 mg by mouth daily.    . Cholecalciferol (VITAMIN D) 50 MCG (2000 UT) CAPS Take 2,000 Units by mouth daily.    Marland Kitchen ELDERBERRY PO Take 1 capsule by mouth daily.    . folic acid (FOLVITE) 1 MG tablet Take 1 mg by mouth daily.    Marland Kitchen ketoconazole (NIZORAL) 2 % cream Apply 1 application topically daily. 15 g 0  . losartan (COZAAR) 100 MG tablet Take 1 tablet by mouth daily 90 tablet 3  . losartan (COZAAR) 50 MG tablet TAKE 1 TABLET BY MOUTH ONCE A DAY FOR HIGH BLOOD PRESSURE (Patient taking  differently: Take 100 mg by mouth daily.) 90 tablet 0  . methotrexate (RHEUMATREX) 15 MG tablet Take 15 mg by mouth every Wednesday. Caution: Chemotherapy. Protect from light.    . Multiple Vitamin (MULTIVITAMIN WITH MINERALS) TABS tablet Take 1 tablet by mouth daily.    . rosuvastatin (CRESTOR) 10 MG tablet TAKE 1 TABLET BY MOUTH ONCE A DAY AT BEDTIME FOR CHOLESTEROL (Patient taking differently: Take 10 mg by mouth at bedtime.) 90 tablet 1  . Blood Glucose Monitoring Suppl (FREESTYLE LITE) w/Device KIT CHECK GLUCOSE 2-3 TIMES DAILY 1 kit 0  . ciprofloxacin (CIPRO) 500 MG tablet Take 1 tablet by mouth twice a day for 3 days for bladder infection 6 tablet 0  . glucose blood test strip USE AS DIRECTED TO CHECK BLOOD SUGAR 1 TO 3 TIMES A DAY (Patient taking differently: USE AS DIRECTED TO CHECK BLOOD SUGAR 1 TO 3 TIMES A DAY) 100 strip 3  . Lancets (FREESTYLE) lancets USE AS DIRECTED TO CHECK BLOOD SUGAR 2 TO 3 TIMES A  DAY (Patient taking differently: USE AS DIRECTED TO CHECK BLOOD SUGAR 2 TO 3 TIMES A DAY) 100 each 3    Results for orders placed or performed during the hospital encounter of 02/08/21 (from the past 48 hour(s))  Glucose, capillary     Status: Abnormal   Collection Time: 02/08/21  6:59 AM  Result Value Ref Range   Glucose-Capillary 128 (H) 70 - 99 mg/dL    Comment: Glucose reference range applies only to samples taken after fasting for at least 8 hours.   CT Chest W Contrast  Result Date: 02/06/2021 CLINICAL DATA:  Staging locally advanced breast cancer. EXAM: CT CHEST WITH CONTRAST TECHNIQUE: Multidetector CT imaging of the chest was performed during intravenous contrast administration. CONTRAST:  35m OMNIPAQUE IOHEXOL 300 MG/ML  SOLN COMPARISON:  None FINDINGS: Cardiovascular: No significant vascular findings. Normal heart size. No pericardial effusion. Mediastinum/Nodes: No enlarged mediastinal, hilar, or axillary lymph nodes. Thyroid gland, trachea, and esophagus demonstrate no significant findings. Lungs/Pleura: No pleural effusion. No airspace consolidation, atelectasis, or pneumothorax. Calcified granuloma identified in the superior segment of right lower lobe, image 44/10. No suspicious lung nodules. Upper Abdomen: No acute abnormality. Previous cholecystectomy. Subjective diffuse hepatic steatosis. Musculoskeletal: Degenerative disc disease identified within the thoracic spine. No acute or suspicious osseous findings. Left breast mass is again noted measuring 3.9 x 3.9 by 4.3 cm. IMPRESSION: 1. No acute cardiopulmonary abnormalities. No findings to suggest metastatic disease to the chest. 2. Left breast mass compatible with known primary breast carcinoma. 3. Hepatic steatosis. Electronically Signed   By: TKerby MoorsM.D.   On: 02/06/2021 15:55   NM Bone Scan Whole Body  Result Date: 02/07/2021 CLINICAL DATA:  Bilateral breast cancer EXAM: NUCLEAR MEDICINE WHOLE BODY BONE SCAN TECHNIQUE:  Whole body anterior and posterior images were obtained approximately 3 hours after intravenous injection of radiopharmaceutical. RADIOPHARMACEUTICALS:  20.5 mCi Technetium-974mDP IV COMPARISON:  02/06/2021 FINDINGS: Anterior and posterior whole body planar images are obtained. Physiologic excretion of radiotracer is seen within the kidneys and bladder. Degenerative type activity is seen within the bilateral shoulders, left wrist, bilateral knees, bilateral ankles. There is no abnormal radiotracer activity within the bony structures to suggest an acute or destructive process. Specifically, no evidence of bony metastatic disease. Incidental note is made of asymmetric radiotracer activity in the region of the upper outer left breast, likely uptake within the patient's known left breast cancer. IMPRESSION: 1. No evidence of bony metastatic disease.  2. Likely soft tissue uptake radiotracer in the left breast corresponding to the patient's known left breast cancer. 3. Degenerative activity as above. Electronically Signed   By: Randa Ngo M.D.   On: 02/07/2021 17:18   US BREAST LTD UNI RIGHT INC AXILLA  Result Date: 02/07/2021 CLINICAL DATA:  55 year old female with biopsy-proven bilateral breast cancer presents for second-look ultrasound of the right axilla after a focal area of borderline cortical thickening was questioned on recent MRI. EXAM: ULTRASOUND OF THE RIGHT AXILLA COMPARISON:  Previous exam(s). FINDINGS: Ultrasound is performed, showing a questioned focal bulge of a bilobed lymph node deep in the right axilla with borderline thickening up to 3-4 mm. Multiple attempts at repositioning the patient were made. However, the lymph node remained deep within the axilla and surrounded by large vessels. A safe window for targeted biopsy could not be reproduced. IMPRESSION: Borderline focal cortical thickening of a deep right axillary lymph node measuring 3-4 mm. Multiple attempts at repositioning the patient  were made. However, a safe window for targeted biopsy could not be reproduced. RECOMMENDATION: Recommendation is to attempt ultrasound-guided seed localization of the questioned lymph node for excision at the time of the patient's definitive surgery. I have discussed the findings and recommendations with the patient. If applicable, a reminder letter will be sent to the patient regarding the next appointment. BI-RADS CATEGORY  4: Suspicious. Electronically Signed   By: Kristopher Oppenheim M.D.   On: 02/07/2021 09:30   ECHOCARDIOGRAM COMPLETE  Result Date: 02/07/2021    ECHOCARDIOGRAM REPORT   Patient Name:   Leslie Wright Date of Exam: 02/07/2021 Medical Rec #:  [id number]      Height:       67.0 in Accession #:    [id number]     Weight:       311.4 lb Date of Birth:  1966/07/24      BSA:          2.441 m Patient Age:    20 years       BP:           147/81 mmHg Patient Gender: F              HR:           78 bpm. Exam Location:  Outpatient Procedure: 2D Echo, Color Doppler, Cardiac Doppler and Strain Analysis Indications:    Chemo Z09  History:        Patient has prior history of Echocardiogram examinations, most                 recent 12/03/2018. Risk Factors:Hypertension.  Sonographer:    Bernadene Person RDCS Referring Phys: [place]  1. Left ventricular ejection fraction, by estimation, is 60 to 65%. The left ventricle has normal function. The left ventricle has no regional wall motion abnormalities. There is mild concentric left ventricular hypertrophy. Left ventricular diastolic parameters are consistent with Grade I diastolic dysfunction (impaired relaxation). The average left ventricular global longitudinal strain is -27.6 %. The global longitudinal strain is normal.  2. Right ventricular systolic function is normal. The right ventricular size is normal. There is normal pulmonary artery systolic pressure.  3. The mitral valve is normal in structure. Trivial mitral valve regurgitation. No  evidence of mitral stenosis.  4. The aortic valve is tricuspid. Aortic valve regurgitation is not visualized. No aortic stenosis is present.  5. Aortic dilatation noted. There is mild dilatation of the ascending aorta, measuring 42  mm.  6. The inferior vena cava is normal in size with greater than 50% respiratory variability, suggesting right atrial pressure of 3 mmHg. FINDINGS  Left Ventricle: Left ventricular ejection fraction, by estimation, is 60 to 65%. The left ventricle has normal function. The left ventricle has no regional wall motion abnormalities. The average left ventricular global longitudinal strain is -27.6 %. The global longitudinal strain is normal. The left ventricular internal cavity size was normal in size. There is mild concentric left ventricular hypertrophy. Left ventricular diastolic parameters are consistent with Grade I diastolic dysfunction (impaired relaxation). Normal left ventricular filling pressure. Right Ventricle: The right ventricular size is normal. No increase in right ventricular wall thickness. Right ventricular systolic function is normal. There is normal pulmonary artery systolic pressure. The tricuspid regurgitant velocity is 1.25 m/s, and  with an assumed right atrial pressure of 3 mmHg, the estimated right ventricular systolic pressure is 9.2 mmHg. Left Atrium: Left atrial size was normal in size. Right Atrium: Right atrial size was normal in size. Pericardium: Trivial pericardial effusion is present. Mitral Valve: The mitral valve is normal in structure. Trivial mitral valve regurgitation. No evidence of mitral valve stenosis. Tricuspid Valve: The tricuspid valve is normal in structure. Tricuspid valve regurgitation is trivial. No evidence of tricuspid stenosis. Aortic Valve: The aortic valve is tricuspid. Aortic valve regurgitation is not visualized. No aortic stenosis is present. Pulmonic Valve: The pulmonic valve was normal in structure. Pulmonic valve regurgitation is  not visualized. No evidence of pulmonic stenosis. Aorta: Aortic dilatation noted. There is mild dilatation of the ascending aorta, measuring 42 mm. Venous: The inferior vena cava is normal in size with greater than 50% respiratory variability, suggesting right atrial pressure of 3 mmHg. IAS/Shunts: No atrial level shunt detected by color flow Doppler.  LEFT VENTRICLE PLAX 2D LVIDd:         3.80 cm  Diastology LVIDs:         2.80 cm  LV e' medial:    9.14 cm/s LV PW:         1.10 cm  LV E/e' medial:  7.7 LV IVS:        1.10 cm  LV e' lateral:   12.30 cm/s LVOT diam:     2.10 cm  LV E/e' lateral: 5.8 LV SV:         77 LV SV Index:   32       2D Longitudinal Strain LVOT Area:     3.46 cm 2D Strain GLS (A2C):   -25.7 %                         2D Strain GLS (A3C):   -27.7 %                         2D Strain GLS (A4C):   -29.3 %                         2D Strain GLS Avg:     -27.6 % RIGHT VENTRICLE RV S prime:     13.10 cm/s TAPSE (M-mode): 2.2 cm LEFT ATRIUM             Index       RIGHT ATRIUM          Index LA diam:        3.50 cm 1.43 cm/m  RA Area:  9.27 cm LA Vol (A2C):   30.3 ml 12.41 ml/m RA Volume:   15.70 ml 6.43 ml/m LA Vol (A4C):   32.8 ml 13.44 ml/m LA Biplane Vol: 33.8 ml 13.85 ml/m  AORTIC VALVE LVOT Vmax:   118.50 cm/s LVOT Vmean:  82.250 cm/s LVOT VTI:    0.224 m  AORTA Ao Root diam: 3.00 cm Ao Asc diam:  4.20 cm MITRAL VALVE               TRICUSPID VALVE MV Area (PHT): 4.31 cm    TR Peak grad:   6.2 mmHg MV Decel Time: 176 msec    TR Vmax:        125.00 cm/s MV E velocity: 70.80 cm/s MV A velocity: 73.20 cm/s  SHUNTS MV E/A ratio:  0.97        Systemic VTI:  0.22 m                            Systemic Diam: 2.10 cm Skeet Latch MD Electronically signed by Skeet Latch MD Signature Date/Time: 02/07/2021/4:03:16 PM    Final    MM CLIP PLACEMENT RIGHT  Result Date: 02/07/2021 CLINICAL DATA:  55 year old female status post stereotactic biopsy of the right breast. EXAM: DIAGNOSTIC RIGHT  MAMMOGRAM POST STEREOTACTIC BIOPSY COMPARISON:  Previous exam(s). FINDINGS: Mammographic images were obtained following stereotactic guided biopsy of the right breast. The biopsy marking clip is in expected position at the site of biopsy. IMPRESSION: Appropriate positioning of the X shaped biopsy marking clip at the site of biopsy in the upper inner right breast posteriorly. Final Assessment: Post Procedure Mammograms for Marker Placement Electronically Signed   By: Kristopher Oppenheim M.D.   On: 02/07/2021 09:13   MM RT BREAST BX W LOC DEV 1ST LESION IMAGE BX SPEC STEREO GUIDE  Result Date: 02/07/2021 CLINICAL DATA:  55 year old female with biopsy-proven bilateral breast cancer presents for stereotactic biopsy of an additional, indeterminate right breast mass. EXAM: RIGHT BREAST STEREOTACTIC CORE NEEDLE BIOPSY COMPARISON:  Previous exams. FINDINGS: The patient and I discussed the procedure of stereotactic-guided biopsy including benefits and alternatives. We discussed the high likelihood of a successful procedure. We discussed the risks of the procedure including infection, bleeding, tissue injury, clip migration, and inadequate sampling. Informed written consent was given. The usual time out protocol was performed immediately prior to the procedure. Using sterile technique and 1% Lidocaine as local anesthetic, under stereotactic guidance, a 9 gauge vacuum assisted device was used to perform core needle biopsy of a mass in the upper inner quadrant of the right breast using a superior approach. Lesion quadrant: Upper inner quadrant At the conclusion of the procedure, an X shaped tissue marker clip was deployed into the biopsy cavity. Follow-up 2-view mammogram was performed and dictated separately. IMPRESSION: Stereotactic-guided biopsy of the right breast. No apparent complications. Electronically Signed   By: Kristopher Oppenheim M.D.   On: 02/07/2021 09:13    Review of Systems  All other systems reviewed and are  negative.   Blood pressure (!) 160/99, pulse 77, temperature 97.7 F (36.5 C), temperature source Oral, resp. rate 17, SpO2 98 %. Physical Exam Vitals reviewed.  Constitutional:      Appearance: Normal appearance.  HENT:     Head: Normocephalic and atraumatic.     Right Ear: External ear normal.     Left Ear: External ear normal.     Nose: No congestion or rhinorrhea.  Eyes:  General: No scleral icterus.    Extraocular Movements: Extraocular movements intact.     Pupils: Pupils are equal, round, and reactive to light.  Cardiovascular:     Rate and Rhythm: Normal rate and regular rhythm.     Pulses: Normal pulses.  Pulmonary:     Effort: Pulmonary effort is normal.  Abdominal:     Palpations: Abdomen is soft.     Tenderness: There is no abdominal tenderness.  Musculoskeletal:     Cervical back: Normal range of motion and neck supple.  Skin:    General: Skin is warm and dry.     Capillary Refill: Capillary refill takes 2 to 3 seconds.     Coloration: Skin is not pale.     Findings: No erythema.  Neurological:     General: No focal deficit present.     Mental Status: She is alert and oriented to person, place, and time.  Psychiatric:        Mood and Affect: Mood normal.        Behavior: Behavior normal.        Thought Content: Thought content normal.        Judgment: Judgment normal.      Assessment/Plan Bilateral breast cancer  Plan port a cath. Chemo to start Friday.  Discussed risks.  Questions answered.  Will not leave accessed due to patient's animals.    Stark Klein, MD 02/08/2021, 9:27 AM

## 2021-02-08 NOTE — Op Note (Signed)
PREOPERATIVE DIAGNOSIS:  Bilateral breast cancer     POSTOPERATIVE DIAGNOSIS:  Same     PROCEDURE: Left internal jugular port placement, Bard ClearVUe  Power Port, MRI safe, 8-French.      SURGEON:  Stark Klein, MD      ANESTHESIA:  General   FINDINGS:  Good venous return, easy flush, and tip of the catheter and   SVC 45 cm.      SPECIMEN:  None.      ESTIMATED BLOOD LOSS:  Minimal.      COMPLICATIONS:  None known.      PROCEDURE:  Pt was identified in the holding area and taken to   the operating room, where patient was placed supine on the operating room   table.  General anesthesia was induced.  Patient's arms were tucked and the upper   chest and neck were prepped and draped in sterile fashion.  Time-out was   performed according to the surgical safety check list.  When all was   correct, we continued.   Local anesthetic was administered over this   area at the angle of the clavicle.    The left subclavian vein was not able to be accessed despite multiple angles under the clavicle. The ultrasound was used and the left IJ was accessed with 1 stick. There was good venous return and the wire passed easily with no ectopy.  Fluoroscopy was used to confirm that the wire was in the vena cava.      The patient was placed back level and the area for the pocket was anethetized   with local anesthetic.  A 3-cm transverse incision was made with a #15   blade.  Cautery was used to divide the subcutaneous tissues down to the   pectoralis muscle.  An [employer] retractor was used to elevate the skin   while a pocket was created on top of the pectoralis fascia.  The port   was placed into the pocket to confirm that it was of adequate size.  The   catheter was preattached to the port.  The port was then secured to the   pectoralis fascia with four 2-0 Prolene sutures.  These were clamped and   not tied down yet.    The catheter was tunneled through to the wire exit   site.  The catheter  was placed along the wire to determine what length it should be to be in the SVC.  The catheter was cut at 35 cm.  The tunneler sheath and dilator were passed over the wire and the dilator and wire were removed.  The catheter was advanced through the tunneler sheath and the tunneler sheath was pulled away.  Care was taken to keep the catheter in the tunneler sheath as this occurred. This was advanced and the tunneler sheath was removed.  Unfortunately, the wire pulled the catheter back into the innominate vein.  The wire was replaced and the catheter re-advanced over the wire. The same thing happened again.    A new catheter was obtained and then entire catheter was used and passed over the wire.  The wire was pulled out and the catheter was in the IVC.  The catheter was pulled back just a bit and cut at 35 cm again.  After this was reattached to the port, the catheter had pulled back again into the innominate.  A third catheter was obtained, and this catheter was left long at 45 cm.  This one stayed  in the SVC/RA junction after the wire was pulled out.    There was good venous  return and easy flush of the catheter.  The Prolene sutures were tied   down to the pectoral fascia.  The skin was reapproximated using 3-0   Vicryl interrupted deep dermal sutures.    Fluoroscopy was used to re-confirm good position of the catheter.  The skin   was then closed using 4-0 Monocryl in a subcuticular fashion.  The port was flushed with concentrated heparin flush as well.  The wounds were then cleaned, dried, and dressed with Dermabond.  The patient was awakened from anesthesia and taken to the PACU in stable condition.  Needle, sponge, and instrument counts were correct.               Stark Klein, MD

## 2021-02-08 NOTE — Anesthesia Preprocedure Evaluation (Addendum)
Anesthesia Evaluation  Patient identified by MRN, date of birth, ID band Patient awake    Reviewed: Allergy & Precautions, NPO status , Patient's Chart, lab work & pertinent test results  History of Anesthesia Complications (+) PONV and history of anesthetic complications  Airway Mallampati: II  TM Distance: <3 FB Neck ROM: Full    Dental  (+) Missing, Loose, Chipped, Dental Advisory Given,    Pulmonary sleep apnea , former smoker,    Pulmonary exam normal        Cardiovascular hypertension, Pt. on medications  Rhythm:Regular Rate:Normal     Neuro/Psych  Headaches, negative psych ROS   GI/Hepatic Neg liver ROS, GERD  ,  Endo/Other  negative endocrine ROS  Renal/GU negative Renal ROS     Musculoskeletal  (+) Arthritis ,   Abdominal (+) + obese,   Peds  Hematology negative hematology ROS (+)   Anesthesia Other Findings   Reproductive/Obstetrics                            Anesthesia Physical Anesthesia Plan  ASA: III  Anesthesia Plan: General   Post-op Pain Management:    Induction: Intravenous  PONV Risk Score and Plan: 4 or greater and Ondansetron, Dexamethasone, Midazolam and Scopolamine patch - Pre-op  Airway Management Planned: LMA and Oral ETT  Additional Equipment: None  Intra-op Plan:   Post-operative Plan: Extubation in OR  Informed Consent: I have reviewed the patients History and Physical, chart, labs and discussed the procedure including the risks, benefits and alternatives for the proposed anesthesia with the patient or authorized representative who has indicated his/her understanding and acceptance.     Dental advisory given  Plan Discussed with: CRNA  Anesthesia Plan Comments:        Anesthesia Quick Evaluation

## 2021-02-08 NOTE — Progress Notes (Signed)
[facility]  Telephone:(336) [phone] Fax:(336) [phone]     ID: Leslie Wright DOB: 1966-08-09  MR#: [id number]  VCB#:[id number]  Patient Care Team: Fanny Bien, MD as PCP - General (Family Medicine) Herminio Commons, MD (Inactive) as PCP - Cardiology (Cardiology) Mauro Kaufmann, RN as Oncology Nurse Navigator Rockwell Germany, RN as Oncology Nurse Navigator Stark Klein, MD as Consulting Physician (General Surgery) Kyung Rudd, MD as Consulting Physician (Radiation Oncology) Chauncey Cruel, MD OTHER MD:  CHIEF COMPLAINT: Bilateral breast cancers  CURRENT TREATMENT: Neoadjuvant chemotherapy   INTERVAL HISTORY: Leslie Wright returns today for follow up of her bilateral breast cancers. She was evaluated in the breast cancer clinic on 01/24/2021.  She is accompanied by her nephew Hulen Skains, who works in [place] at [employer].  Since consultation, she underwent genetic testing on 01/30/2021. Results are pending.  She also underwent breast MRI on 02/01/2021 showing: breast composition C; 5.8 cm spiculated mass in upper-outer left breast, correlating to biopsy-proven malignancy; 2.1 cm spiculated enhancing mass in right breast at 6 o'clock, consistent with biopsy-proven malignancy; extensive patchy nodular enhancement throughout lower portions of bilateral breast, additional site of cancer is not excluded; a right axillary lymph node with focal eccentric cortex enhancement.  She also underwent staging chest CT and bone scan on 02/06/2021. Chest CT showed: no acute cardiopulmonary abnormalities; no findings to suggest metastatic disease.  Bone scan was also negative for metastatic disease.  She also underwent echocardiogram on 02/07/2021 showing an ejection fraction of 60-65%.  She also underwent second-look right breast ultrasound on 02/07/2021 to further evaluate the lymph node seen on MRI. This showed: borderline focal cortical thickening of a deep right axillary lymph node measuring  3-4 mm, a safe window for targeted biopsy could not be reproduced.  She also underwent additional right breast biopsy on 02/07/2021. Pathology results are pending.  Finally, she also underwent port placement yesterday, 02/08/2021.   REVIEW OF SYSTEMS: Leslie Wright did well with port placement yesterday.  She has minimal soreness today, controlled on Tylenol.  She has a little discomfort in the neck but its not a big deal.  She had a urinary tract infection over the weekend and this is being treated with Cipro.  That explains her elevated white cell count.  A detailed review of systems today is otherwise stable   COVID 19 VACCINATION STATUS: Status post [place] x2, no booster as of 01/24/2021   HISTORY OF CURRENT ILLNESS: From the original intake note:  Leslie Wright" presented with a palpable left breast lump. She underwent bilateral diagnostic mammography with tomography and bilateral breast ultrasonography at [facility] on 01/12/2021 showing: breast density category C; 5.8 cm mass in the left breast at 12:30 with associated calcifications; additional 1.2 cm group of calcifications in the outer left breast;   On the right: Multiple small masses and irregular ducts in the retroareolar right breast, including a 0.8 cm representative mass at 6 o'clock; indeterminate 0.4 cm mass in upper-inner right breast; normal lymph nodes bilaterally.  Accordingly on 01/16/2021 she proceeded to biopsy of the right breast areas in question. The pathology from this procedure ([id number]) showed:  1. Right Breast, 6 o'clock  - invasive ductal carcinoma, grade 2. Prognostic indicators significant for: estrogen receptor, 90% positive with strong staining intensity and progesterone receptor, 0% negative. Proliferation marker Ki67 at 5%. HER2 equivocal by immunohistochemistry (2+), but negative by fluorescent in situ hybridization with a signals ratio 1.61 and number per cell  2.5. 2. Right Breast, 9 o'clock  -  fibrocystic change with adenosis, calcifications, and a small intraductal papilloma.  She underwent biopsy of the left breast areas on 01/20/2021. Pathology [id number]) revealed: 1. Left Breast, upper-outer quadrant  - invasive ductal carcinoma, grade 3 2. Left Breast, central, slightly lateral to midline  - ductal carcinoma in situ with necrosis and calcifications, intermediate grade Prognostic panels are pending on both samples.  Cancer Staging Malignant neoplasm of lower-outer quadrant of right breast of female, estrogen receptor positive ([place]) Staging form: Breast, AJCC 8th Edition - Clinical stage from 01/20/2021: Stage IA (cT1c, cN0, cM0, G2, ER+, PR-, HER2-) - Signed by Chauncey Cruel, MD on 01/25/2021 Stage prefix: Initial diagnosis Histologic grading system: 3 grade system  Malignant neoplasm of overlapping sites of left breast in female, estrogen receptor negative ([place]) Staging form: Breast, AJCC 8th Edition - Clinical stage from 01/20/2021: Stage IIIB (cT3, cN0, cM0, G3, ER-, PR-, HER2-) - Signed by Chauncey Cruel, MD on 01/25/2021 Histologic grading system: 3 grade system  The patient's subsequent history is as detailed below.   PAST MEDICAL HISTORY: Past Medical History:  Diagnosis Date  . Acid reflux   . Anemia   . Arthritis    "maybe in my fingers" (03/26/2018)  . Breast cancer ([place]) 14-Feb-2021  . Family history of adverse reaction to anesthesia    "brother, Otila Kluver, and my mom always get sick" (03/26/2018)  . Family history of brain cancer   . Family history of breast cancer   . Family history of colon cancer   . Family history of lung cancer   . Family history of pancreatic cancer   . Family history of prostate cancer   . Gallbladder problem   . Headache   . High blood pressure   . High cholesterol   . Hip pain    "left; before OR" (03/26/2018)  . History of kidney stones   . OSA (obstructive sleep apnea)    "can't tolerate the mask" (03/26/2018)  . PONV  (postoperative nausea and vomiting)   . Pre-diabetes   . Shortness of breath   . Swelling of lower extremity    "that's why they put me on fluid pill" (03/26/2018)    PAST SURGICAL HISTORY: Past Surgical History:  Procedure Laterality Date  . CARPAL TUNNEL RELEASE Right ~ 2006  . EYE SURGERY    . JOINT REPLACEMENT    . LAPAROSCOPIC CHOLECYSTECTOMY  02/15/2011  . LASIK Bilateral 2000  . TOTAL HIP ARTHROPLASTY Left 03/25/2018   Procedure: LEFT TOTAL HIP ARTHROPLASTY ANTERIOR APPROACH;  Surgeon: Mcarthur Rossetti, MD;  Location: [facility];  Service: Orthopedics;  Laterality: Left;    FAMILY HISTORY: Family History  Problem Relation Age of Onset  . Diabetes Mother   . Thyroid disease Mother   . Liver disease Mother   . Obesity Mother   . Hypertension Father   . Hyperlipidemia Father   . Heart disease Father   . Stroke Father   . Obesity Father   . Colon polyps Father   . Prostate cancer Brother   . Brain cancer Maternal Grandfather   . Pancreatic cancer Paternal Grandmother   . Colon cancer Neg Hx        No one in family with colon cancer.  The patient's father is 27 years old as of 02-14-2021.  The patient's mother died from cirrhosis associated with hepatitis C (from a transfusion) age 73.  The patient has 3 brothers, no  sisters.  1 brother has a history of prostate cancer at age 26.  A maternal grandfather had a history of central nervous system carcinoma.  A paternal grandmother had a history of pancreatic cancer and a maternal aunt had a history of breast cancer in her late 42s.   GYNECOLOGIC HISTORY:  No LMP recorded (lmp unknown). Patient is postmenopausal. Menarche: 55 years old [place] P 0 LMP 53 HRT no  Hysterectomy? no BSO? no   SOCIAL HISTORY: (updated 12/2020)  Leslie Wright "Leslie Wright" works for [employer] in [employer].  She works from home.  She is a Engineer, mining.  She describes herself as single, lives by herself, with a toy poodle and a [place] as well as 2  cats.  ADVANCED DIRECTIVES: The patient's brother Tynslee Bowlds is her healthcare power of attorney.  He can be reached at [phone]   HEALTH MAINTENANCE: Social History   Tobacco Use  . Smoking status: Former Research scientist (life sciences)  . Smokeless tobacco: Former Systems developer    Types: Chew  . Tobacco comment: In teens  Vaping Use  . Vaping Use: Never used  Substance Use Topics  . Alcohol use: Yes    Comment: Occasional  . Drug use: Never     Colonoscopy:   PAP: 03/2011  Bone density: scheduled for 01/2021   No Known Allergies  Current Outpatient Medications  Medication Sig Dispense Refill  . acetaminophen (TYLENOL) 500 MG tablet Take 1,000 mg by mouth every 6 (six) hours as needed for moderate pain.    Marland Kitchen aspirin EC 81 MG tablet Take 81 mg by mouth daily.    . Blood Glucose Monitoring Suppl (FREESTYLE LITE) w/Device KIT CHECK GLUCOSE 2-3 TIMES DAILY 1 kit 0  . Cholecalciferol (VITAMIN D) 50 MCG (2000 UT) CAPS Take 2,000 Units by mouth daily.    . ciprofloxacin (CIPRO) 500 MG tablet Take 1 tablet by mouth twice a day for 3 days for bladder infection 6 tablet 0  . dexamethasone (DECADRON) 4 MG tablet Take 2 tablets once a day for 3 days after carboplatin and AC chemotherapy. Take with food. 30 tablet 1  . ELDERBERRY PO Take 1 capsule by mouth daily.    . folic acid (FOLVITE) 1 MG tablet Take 1 mg by mouth daily.    Marland Kitchen glucose blood test strip USE AS DIRECTED TO CHECK BLOOD SUGAR 1 TO 3 TIMES A DAY (Patient taking differently: USE AS DIRECTED TO CHECK BLOOD SUGAR 1 TO 3 TIMES A DAY) 100 strip 3  . ketoconazole (NIZORAL) 2 % cream Apply 1 application topically daily. 15 g 0  . Lancets (FREESTYLE) lancets USE AS DIRECTED TO CHECK BLOOD SUGAR 2 TO 3 TIMES A DAY (Patient taking differently: USE AS DIRECTED TO CHECK BLOOD SUGAR 2 TO 3 TIMES A DAY) 100 each 3  . lidocaine-prilocaine (EMLA) cream Apply to affected area once 30 g 3  . losartan (COZAAR) 100 MG tablet Take 1 tablet by mouth daily 90 tablet 3  .  losartan (COZAAR) 50 MG tablet TAKE 1 TABLET BY MOUTH ONCE A DAY FOR HIGH BLOOD PRESSURE (Patient taking differently: Take 100 mg by mouth daily.) 90 tablet 0  . methotrexate (RHEUMATREX) 15 MG tablet Take 15 mg by mouth every Wednesday. Caution: Chemotherapy. Protect from light.    . Multiple Vitamin (MULTIVITAMIN WITH MINERALS) TABS tablet Take 1 tablet by mouth daily.    . ondansetron (ZOFRAN) 8 MG tablet Take 1 tablet (8 mg total) by mouth 2 (two) times daily as  needed. Start on the third day after carboplatin and AC chemotherapy. 30 tablet 1  . oxyCODONE (OXY IR/ROXICODONE) 5 MG immediate release tablet Take 1 tablet (5 mg total) by mouth every 6 (six) hours as needed for severe pain. 8 tablet 0  . prochlorperazine (COMPAZINE) 10 MG tablet Take 1 tablet (10 mg total) by mouth every 6 (six) hours as needed (Nausea or vomiting). 30 tablet 1  . rosuvastatin (CRESTOR) 10 MG tablet TAKE 1 TABLET BY MOUTH ONCE A DAY AT BEDTIME FOR CHOLESTEROL (Patient taking differently: Take 10 mg by mouth at bedtime.) 90 tablet 1   No current facility-administered medications for this visit.    OBJECTIVE: White woman who appears stated age  Vitals:   02/09/21 0805  BP: (!) 118/49  Pulse: 76  Resp: 20  Temp: 97.9 F (36.6 C)  SpO2: 96%     Body mass index is 48.8 kg/m.   Wt Readings from Last 3 Encounters:  02/09/21 (!) 311 lb 9.6 oz (141.3 kg)  02/07/21 (!) 305 lb 9.6 oz (138.6 kg)  01/25/21 (!) 311 lb 6.4 oz (141.3 kg)      ECOG FS:1 - Symptomatic but completely ambulatory  Sclerae unicteric, EOMs intact Wearing a mask No cervical or supraclavicular adenopathy Lungs no rales or rhonchi Heart regular rate and rhythm Abd soft, nontender, positive bowel sounds MSK no focal spinal tenderness, no upper extremity lymphedema Neuro: nonfocal, well oriented, appropriate affect Breasts: Deferred.  On prior exam the left breast mass was easily palpable but the right breast mass was not palpable   LAB  RESULTS:  CMP     Component Value Date/Time   NA 143 02/09/2021 0748   NA 143 12/02/2018 1138   K 5.2 (H) 02/09/2021 0748   CL 105 02/09/2021 0748   CO2 27 02/09/2021 0748   GLUCOSE 140 (H) 02/09/2021 0748   BUN 23 (H) 02/09/2021 0748   BUN 17 12/02/2018 1138   CREATININE 1.44 (H) 02/09/2021 0748   CREATININE 1.32 (H) 01/24/2021 1611   CREATININE 1.09 (H) 02/18/2019 1524   CALCIUM 9.6 02/09/2021 0748   PROT 7.0 02/09/2021 0748   PROT 6.9 12/02/2018 1138   ALBUMIN 3.3 (L) 02/09/2021 0748   ALBUMIN 4.0 12/02/2018 1138   AST 13 (L) 02/09/2021 0748   AST 14 (L) 01/24/2021 1611   ALT 15 02/09/2021 0748   ALT 17 01/24/2021 1611   ALKPHOS 71 02/09/2021 0748   BILITOT <0.2 (L) 02/09/2021 0748   BILITOT 0.2 (L) 01/24/2021 1611   GFRNONAA 43 (L) 02/09/2021 0748   GFRNONAA 47 (L) 01/24/2021 1611   GFRAA 70 12/02/2018 1138    No results found for: TOTALPROTELP, ALBUMINELP, A1GS, A2GS, BETS, BETA2SER, GAMS, MSPIKE, SPEI  Lab Results  Component Value Date   WBC 18.2 (H) 02/09/2021   NEUTROABS 14.8 (H) 02/09/2021   HGB 12.0 02/09/2021   HCT 38.1 02/09/2021   MCV 88.6 02/09/2021   PLT 287 02/09/2021    No results found for: LABCA2  No components found for: FAOZHY865  No results for input(s): INR in the last 168 hours.  No results found for: LABCA2  No results found for: HQI696  No results found for: EXB284  No results found for: XLK440  No results found for: CA2729  No components found for: HGQUANT  No results found for: CEA1 / No results found for: CEA1   No results found for: AFPTUMOR  No results found for: [place]  No results found for: KPAFRELGTCHN, LAMBDASER,  KAPLAMBRATIO (kappa/lambda light chains)  No results found for: HGBA, HGBA2QUANT, HGBFQUANT, HGBSQUAN (Hemoglobinopathy evaluation)   No results found for: LDH  No results found for: IRON, TIBC, IRONPCTSAT (Iron and TIBC)  No results found for: FERRITIN  Urinalysis    Component Value  Date/Time   COLORURINE YELLOW 10/23/2010 2031   APPEARANCEUR CLEAR 10/23/2010 2031   LABSPEC 1.025 10/23/2010 2031   PHURINE 6.0 10/23/2010 2031   GLUCOSEU NEGATIVE 03/12/2009 0843   HGBUR MODERATE (A) 10/23/2010 2031   BILIRUBINUR NEGATIVE 10/23/2010 2031   KETONESUR NEGATIVE 10/23/2010 2031   PROTEINUR TRACE (A) 10/23/2010 2031   UROBILINOGEN 0.2 10/23/2010 2031   NITRITE NEGATIVE 10/23/2010 2031   LEUKOCYTESUR NEGATIVE 10/23/2010 2031    STUDIES: CT Chest W Contrast  Result Date: 02/06/2021 CLINICAL DATA:  Staging locally advanced breast cancer. EXAM: CT CHEST WITH CONTRAST TECHNIQUE: Multidetector CT imaging of the chest was performed during intravenous contrast administration. CONTRAST:  46m OMNIPAQUE IOHEXOL 300 MG/ML  SOLN COMPARISON:  None FINDINGS: Cardiovascular: No significant vascular findings. Normal heart size. No pericardial effusion. Mediastinum/Nodes: No enlarged mediastinal, hilar, or axillary lymph nodes. Thyroid gland, trachea, and esophagus demonstrate no significant findings. Lungs/Pleura: No pleural effusion. No airspace consolidation, atelectasis, or pneumothorax. Calcified granuloma identified in the superior segment of right lower lobe, image 44/10. No suspicious lung nodules. Upper Abdomen: No acute abnormality. Previous cholecystectomy. Subjective diffuse hepatic steatosis. Musculoskeletal: Degenerative disc disease identified within the thoracic spine. No acute or suspicious osseous findings. Left breast mass is again noted measuring 3.9 x 3.9 by 4.3 cm. IMPRESSION: 1. No acute cardiopulmonary abnormalities. No findings to suggest metastatic disease to the chest. 2. Left breast mass compatible with known primary breast carcinoma. 3. Hepatic steatosis. Electronically Signed   By: TKerby MoorsM.D.   On: 02/06/2021 15:55   NM Bone Scan Whole Body  Result Date: 02/07/2021 CLINICAL DATA:  Bilateral breast cancer EXAM: NUCLEAR MEDICINE WHOLE BODY BONE SCAN TECHNIQUE:  Whole body anterior and posterior images were obtained approximately 3 hours after intravenous injection of radiopharmaceutical. RADIOPHARMACEUTICALS:  20.5 mCi [id number] IV COMPARISON:  02/06/2021 FINDINGS: Anterior and posterior whole body planar images are obtained. Physiologic excretion of radiotracer is seen within the kidneys and bladder. Degenerative type activity is seen within the bilateral shoulders, left wrist, bilateral knees, bilateral ankles. There is no abnormal radiotracer activity within the bony structures to suggest an acute or destructive process. Specifically, no evidence of bony metastatic disease. Incidental note is made of asymmetric radiotracer activity in the region of the upper outer left breast, likely uptake within the patient's known left breast cancer. IMPRESSION: 1. No evidence of bony metastatic disease. 2. Likely soft tissue uptake radiotracer in the left breast corresponding to the patient's known left breast cancer. 3. Degenerative activity as above. Electronically Signed   By: MiRanda Ngo.D.   On: 02/07/2021 17:18   MR BREAST BILATERAL W WO CONTRAST INC CAD  Result Date: 02/02/2021 CLINICAL DATA:  Recent diagnosis of bilateral breast cancer, 2 site in the left breast, 1 site in the right breast. There is a second site of biopsy in the right breast 9 o'clock with benign pathology and found be discordant for which excision is recommended. LABS:  Does not apply EXAM: BILATERAL BREAST MRI WITH AND WITHOUT CONTRAST TECHNIQUE: Multiplanar, multisequence MR images of both breasts were obtained prior to and following the intravenous administration of 10 ml of Gadavist Three-dimensional MR images were rendered by post-processing of the original MR data  on an independent workstation. The three-dimensional MR images were interpreted, and findings are reported in the following complete MRI report for this study. Three dimensional images were evaluated at the independent  interpreting workstation using the DynaCAD thin client. COMPARISON:  Prior films FINDINGS: Breast composition: c. Heterogeneous fibroglandular tissue. Background parenchymal enhancement: Moderate. Right breast: There is a 2.1 x 1.5 cm spiculated enhancing mass in the right breast 6 o'clock with associated biopsy clip consistent with recently biopsy proven right breast cancer (series 9, image 105.) There is biopsy clip artifact in the anterior lateral right breast, site of previous biopsy which show papilloma at 9 o'clock. There is extensive patchy nodular enhancement throughout the inferior portion of the right breast. Left breast: There is a 5.8 x 4.5 cm spiculated enhancing mass in the lateral upper left breast with associated biopsy clip consistent with recently biopsy proven left breast cancer. There is biopsy clip artifact in the lateral lower left breast correlating to recently biopsy proven left breast DCIS. There is surrounding extensive patchy nodular enhancement throughout the inferior portion of the left breast and adjacent to the biopsy clip in the lateral lower left breast which showed DCIS on pathology. Lymph nodes: In the right axilla, there is a right axillary lymph node with focal eccentric cortex enhancement, cortex measuring 1 cm. Ancillary findings:  None. IMPRESSION: 1. 5.8 cm spiculated mass in the upper-outer quadrant left breast correlating to recently biopsy proven left breast cancer. 2. 2.1 cm spiculated enhancing mass in the right breast 6 o'clock with associated biopsy clip consistent with recently proven right breast cancer. 3. There are extensive patchy nodular enhancement throughout the lower portions of bilateral breast. The left breast biopsy proven DCIS clip is located in one of the areas of this extensive patchy nodular enhancement. Given the MRI enhancement in the inferior portions of bilateral breast, additional site of cancer is not excluded in the inferior portions of bilateral  breasts. If breast conservation is being consider for either breast, the patient will need MR guided biopsies of bilateral lower breasts, 2 sites in the each breast of the anterior and posterior extent. 4. Recommend stereotactic core biopsy of indeterminate small mass in the upper inner quadrant right breast seen and described on the diagnostic mammogram of January 12, 2021. 5. Recommend second-look ultrasound with possible biopsy of one abnormal eccentric thickened cortex right axillary lymph node. RECOMMENDATION: 1. recommend stereotactic core biopsy of indeterminate small mass in the upper inner quadrant right breast seen and described on the diagnostic mammogram of January 12, 2021. 2. Recommend second-look ultrasound with possible biopsy of one abnormal eccentric thickened cortex right axillary lymph node. 3. There are extensive patchy nodular enhancement throughout the lower portions of bilateral breast. The left breast biopsy proven DCIS clip is located in one of the areas of this extensive patchy nodular enhancement. Given the MRI enhancement in the inferior portions of bilateral breast, additional site of cancer is not excluded in the inferior portions of bilateral breasts. If breast conservation is being consider for either breast, the patient will need MR guided biopsies of bilateral lower breasts, 2 sites in the each breast of the anterior and posterior extent. BI-RADS CATEGORY  4: Suspicious. Electronically Signed   By: Abelardo Diesel M.D.   On: 02/02/2021 12:19   DG CHEST PORT 1 VIEW  Addendum Date: 02/08/2021   ADDENDUM REPORT: 02/08/2021 13:00 ADDENDUM: The left IJ power port courses down the left internal jugular vein, across the left brachiocephalic vein and  into the SVC. The catheter then courses down into the azygos vein. Electronically Signed   By: Marijo Sanes M.D.   On: 02/08/2021 13:00   Result Date: 02/08/2021 CLINICAL DATA:  Porta catheter placement EXAM: PORTABLE CHEST 1 VIEW COMPARISON:   02/06/2021 FINDINGS: Left-sided porta catheter with loop in the neck but no kink. Unexpected tortuosity in the upper mediastinum which is likely azygos cannulation. Low volume chest.  No evidence of hemothorax or pneumothorax. IMPRESSION: Unexpected looping of the distal catheter, likely azygos cannulation. Negative for pneumothorax. Electronically Signed: By: Monte Fantasia M.D. On: 02/08/2021 11:46   DG C-Arm 1-60 Min-No Report  Result Date: 02/08/2021 Fluoroscopy was utilized by the requesting physician.  No radiographic interpretation.   US BREAST LTD UNI LEFT INC AXILLA  Result Date: 01/12/2021 CLINICAL DATA:  55 year old female presenting with a new lump in the left breast. EXAM: DIGITAL DIAGNOSTIC BILATERAL MAMMOGRAM WITH TOMOSYNTHESIS AND CAD; ULTRASOUND RIGHT BREAST LIMITED; ULTRASOUND LEFT BREAST LIMITED TECHNIQUE: Bilateral digital diagnostic mammography and breast tomosynthesis was performed. The images were evaluated with computer-aided detection.; Targeted ultrasound examination of the right breast was performed; Targeted ultrasound examination of the left breast was performed COMPARISON:  Previous exam(s). ACR Breast Density Category c: The breast tissue is heterogeneously dense, which may obscure small masses. FINDINGS: Mammogram: Right breast: Spot compression tomosynthesis views were performed in addition to standard views. There is distortion in the retroareolar inferior right breast which persists on the spot imaging. There is a small mass in the upper inner right breast posterior depth measuring approximately 0.4 cm. The mass spans approximately 5.9 cm. Left breast: A skin BB marks the palpable site of concern reported by the patient in the upper outer left breast a spot tangential view of this area was performed in addition to standard views demonstrating an irregular spiculated mass with associated pleomorphic calcifications. Spot 2D magnification views were performed for a group of  calcifications in the outer left breast middle depth spanning approximately 1.2 cm. On physical exam of the left breast I feel a fixed discrete mass at the site of concern reported by the patient in the upper outer quadrant. Ultrasound: Right breast: Targeted ultrasound performed in the right breast at 1 o'clock 7 cm from the nipple demonstrating an irregular hypoechoic mass measuring 0.5 x 0.3 x 0.3 cm. This corresponds to the mammographic finding. In the inferior retroareolar aspect of the breast there are multiple small hypoechoic masses and irregular mildly dilated ducts. A representative mass at 6 o'clock retroareolar measures 0.8 x 0.6 x 0.4 cm. There is internal vascularity. This may correspond to the area of distortion identified mammographically. Targeted ultrasound of the right axilla demonstrates normal lymph nodes. Left breast: Targeted ultrasound is performed in the left breast at the palpable site of concern at 12:30 o'clock 11 cm from the nipple demonstrating a large irregular hypoechoic mass measuring at least 5.8 x 3.0 x 3.5 cm. Targeted ultrasound of the left axilla demonstrates normal lymph nodes. IMPRESSION: 1. Highly suspicious mass with associated calcifications in the left breast at 12:30 o'clock measuring at least 5.8 cm. 2. Additional suspicious group of calcifications in the outer left breast measuring 1.2 cm. 3. Multiple small masses and irregular ducts in the retroareolar right breast. A representative mass at retroareolar 6 o'clock measures 0.8 cm. This may correspond to the distortion identified mammographically. 4. Indeterminate small mass in the upper inner right breast measuring 0.4 cm. 5.  No axillary adenopathy. RECOMMENDATION: 1. Ultrasound-guided core needle biopsy  of the right breast mass at 6 o'clock retroareolar and of the right breast mass at 1 o'clock. 2. Ultrasound-guided core needle biopsy of the left breast mass at 12:30 o'clock. 3. Stereotactic core needle biopsy of the  calcifications in the outer left breast. Given the multiplicity of findings bilaterally, MRI is recommended following the above biopsies. I have discussed the findings and recommendations with the patient who agrees to proceed with biopsy. The patient will be scheduled for the biopsy appointment prior to leaving the office today. BI-RADS CATEGORY  5: Highly suggestive of malignancy. Electronically Signed   By: Audie Pinto M.D.   On: 01/12/2021 16:05   US BREAST LTD UNI RIGHT INC AXILLA  Result Date: 02/07/2021 CLINICAL DATA:  55 year old female with biopsy-proven bilateral breast cancer presents for second-look ultrasound of the right axilla after a focal area of borderline cortical thickening was questioned on recent MRI. EXAM: ULTRASOUND OF THE RIGHT AXILLA COMPARISON:  Previous exam(s). FINDINGS: Ultrasound is performed, showing a questioned focal bulge of a bilobed lymph node deep in the right axilla with borderline thickening up to 3-4 mm. Multiple attempts at repositioning the patient were made. However, the lymph node remained deep within the axilla and surrounded by large vessels. A safe window for targeted biopsy could not be reproduced. IMPRESSION: Borderline focal cortical thickening of a deep right axillary lymph node measuring 3-4 mm. Multiple attempts at repositioning the patient were made. However, a safe window for targeted biopsy could not be reproduced. RECOMMENDATION: Recommendation is to attempt ultrasound-guided seed localization of the questioned lymph node for excision at the time of the patient's definitive surgery. I have discussed the findings and recommendations with the patient. If applicable, a reminder letter will be sent to the patient regarding the next appointment. BI-RADS CATEGORY  4: Suspicious. Electronically Signed   By: Kristopher Oppenheim M.D.   On: 02/07/2021 09:30   US BREAST LTD UNI RIGHT INC AXILLA  Result Date: 01/12/2021 CLINICAL DATA:  55 year old female  presenting with a new lump in the left breast. EXAM: DIGITAL DIAGNOSTIC BILATERAL MAMMOGRAM WITH TOMOSYNTHESIS AND CAD; ULTRASOUND RIGHT BREAST LIMITED; ULTRASOUND LEFT BREAST LIMITED TECHNIQUE: Bilateral digital diagnostic mammography and breast tomosynthesis was performed. The images were evaluated with computer-aided detection.; Targeted ultrasound examination of the right breast was performed; Targeted ultrasound examination of the left breast was performed COMPARISON:  Previous exam(s). ACR Breast Density Category c: The breast tissue is heterogeneously dense, which may obscure small masses. FINDINGS: Mammogram: Right breast: Spot compression tomosynthesis views were performed in addition to standard views. There is distortion in the retroareolar inferior right breast which persists on the spot imaging. There is a small mass in the upper inner right breast posterior depth measuring approximately 0.4 cm. The mass spans approximately 5.9 cm. Left breast: A skin BB marks the palpable site of concern reported by the patient in the upper outer left breast a spot tangential view of this area was performed in addition to standard views demonstrating an irregular spiculated mass with associated pleomorphic calcifications. Spot 2D magnification views were performed for a group of calcifications in the outer left breast middle depth spanning approximately 1.2 cm. On physical exam of the left breast I feel a fixed discrete mass at the site of concern reported by the patient in the upper outer quadrant. Ultrasound: Right breast: Targeted ultrasound performed in the right breast at 1 o'clock 7 cm from the nipple demonstrating an irregular hypoechoic mass measuring 0.5 x 0.3 x 0.3 cm.  This corresponds to the mammographic finding. In the inferior retroareolar aspect of the breast there are multiple small hypoechoic masses and irregular mildly dilated ducts. A representative mass at 6 o'clock retroareolar measures 0.8 x 0.6 x  0.4 cm. There is internal vascularity. This may correspond to the area of distortion identified mammographically. Targeted ultrasound of the right axilla demonstrates normal lymph nodes. Left breast: Targeted ultrasound is performed in the left breast at the palpable site of concern at 12:30 o'clock 11 cm from the nipple demonstrating a large irregular hypoechoic mass measuring at least 5.8 x 3.0 x 3.5 cm. Targeted ultrasound of the left axilla demonstrates normal lymph nodes. IMPRESSION: 1. Highly suspicious mass with associated calcifications in the left breast at 12:30 o'clock measuring at least 5.8 cm. 2. Additional suspicious group of calcifications in the outer left breast measuring 1.2 cm. 3. Multiple small masses and irregular ducts in the retroareolar right breast. A representative mass at retroareolar 6 o'clock measures 0.8 cm. This may correspond to the distortion identified mammographically. 4. Indeterminate small mass in the upper inner right breast measuring 0.4 cm. 5.  No axillary adenopathy. RECOMMENDATION: 1. Ultrasound-guided core needle biopsy of the right breast mass at 6 o'clock retroareolar and of the right breast mass at 1 o'clock. 2. Ultrasound-guided core needle biopsy of the left breast mass at 12:30 o'clock. 3. Stereotactic core needle biopsy of the calcifications in the outer left breast. Given the multiplicity of findings bilaterally, MRI is recommended following the above biopsies. I have discussed the findings and recommendations with the patient who agrees to proceed with biopsy. The patient will be scheduled for the biopsy appointment prior to leaving the office today. BI-RADS CATEGORY  5: Highly suggestive of malignancy. Electronically Signed   By: Audie Pinto M.D.   On: 01/12/2021 16:05   MM DIAG BREAST TOMO BILATERAL  Result Date: 01/12/2021 CLINICAL DATA:  55 year old female presenting with a new lump in the left breast. EXAM: DIGITAL DIAGNOSTIC BILATERAL MAMMOGRAM WITH  TOMOSYNTHESIS AND CAD; ULTRASOUND RIGHT BREAST LIMITED; ULTRASOUND LEFT BREAST LIMITED TECHNIQUE: Bilateral digital diagnostic mammography and breast tomosynthesis was performed. The images were evaluated with computer-aided detection.; Targeted ultrasound examination of the right breast was performed; Targeted ultrasound examination of the left breast was performed COMPARISON:  Previous exam(s). ACR Breast Density Category c: The breast tissue is heterogeneously dense, which may obscure small masses. FINDINGS: Mammogram: Right breast: Spot compression tomosynthesis views were performed in addition to standard views. There is distortion in the retroareolar inferior right breast which persists on the spot imaging. There is a small mass in the upper inner right breast posterior depth measuring approximately 0.4 cm. The mass spans approximately 5.9 cm. Left breast: A skin BB marks the palpable site of concern reported by the patient in the upper outer left breast a spot tangential view of this area was performed in addition to standard views demonstrating an irregular spiculated mass with associated pleomorphic calcifications. Spot 2D magnification views were performed for a group of calcifications in the outer left breast middle depth spanning approximately 1.2 cm. On physical exam of the left breast I feel a fixed discrete mass at the site of concern reported by the patient in the upper outer quadrant. Ultrasound: Right breast: Targeted ultrasound performed in the right breast at 1 o'clock 7 cm from the nipple demonstrating an irregular hypoechoic mass measuring 0.5 x 0.3 x 0.3 cm. This corresponds to the mammographic finding. In the inferior retroareolar aspect of the breast there  are multiple small hypoechoic masses and irregular mildly dilated ducts. A representative mass at 6 o'clock retroareolar measures 0.8 x 0.6 x 0.4 cm. There is internal vascularity. This may correspond to the area of distortion identified  mammographically. Targeted ultrasound of the right axilla demonstrates normal lymph nodes. Left breast: Targeted ultrasound is performed in the left breast at the palpable site of concern at 12:30 o'clock 11 cm from the nipple demonstrating a large irregular hypoechoic mass measuring at least 5.8 x 3.0 x 3.5 cm. Targeted ultrasound of the left axilla demonstrates normal lymph nodes. IMPRESSION: 1. Highly suspicious mass with associated calcifications in the left breast at 12:30 o'clock measuring at least 5.8 cm. 2. Additional suspicious group of calcifications in the outer left breast measuring 1.2 cm. 3. Multiple small masses and irregular ducts in the retroareolar right breast. A representative mass at retroareolar 6 o'clock measures 0.8 cm. This may correspond to the distortion identified mammographically. 4. Indeterminate small mass in the upper inner right breast measuring 0.4 cm. 5.  No axillary adenopathy. RECOMMENDATION: 1. Ultrasound-guided core needle biopsy of the right breast mass at 6 o'clock retroareolar and of the right breast mass at 1 o'clock. 2. Ultrasound-guided core needle biopsy of the left breast mass at 12:30 o'clock. 3. Stereotactic core needle biopsy of the calcifications in the outer left breast. Given the multiplicity of findings bilaterally, MRI is recommended following the above biopsies. I have discussed the findings and recommendations with the patient who agrees to proceed with biopsy. The patient will be scheduled for the biopsy appointment prior to leaving the office today. BI-RADS CATEGORY  5: Highly suggestive of malignancy. Electronically Signed   By: Audie Pinto M.D.   On: 01/12/2021 16:05   ECHOCARDIOGRAM COMPLETE  Result Date: 02/07/2021    ECHOCARDIOGRAM REPORT   Patient Name:   Leslie Wright Date of Exam: 02/07/2021 Medical Rec #:  [id number]      Height:       67.0 in Accession #:    [id number]     Weight:       311.4 lb Date of Birth:  10-Jan-1966      BSA:           2.441 m Patient Age:    21 years       BP:           147/81 mmHg Patient Gender: F              HR:           78 bpm. Exam Location:  Outpatient Procedure: 2D Echo, Color Doppler, Cardiac Doppler and Strain Analysis Indications:    Chemo Z09  History:        Patient has prior history of Echocardiogram examinations, most                 recent 12/03/2018. Risk Factors:Hypertension.  Sonographer:    Bernadene Person RDCS Referring Phys: [place]  1. Left ventricular ejection fraction, by estimation, is 60 to 65%. The left ventricle has normal function. The left ventricle has no regional wall motion abnormalities. There is mild concentric left ventricular hypertrophy. Left ventricular diastolic parameters are consistent with Grade I diastolic dysfunction (impaired relaxation). The average left ventricular global longitudinal strain is -27.6 %. The global longitudinal strain is normal.  2. Right ventricular systolic function is normal. The right ventricular size is normal. There is normal pulmonary artery systolic pressure.  3. The mitral valve is  normal in structure. Trivial mitral valve regurgitation. No evidence of mitral stenosis.  4. The aortic valve is tricuspid. Aortic valve regurgitation is not visualized. No aortic stenosis is present.  5. Aortic dilatation noted. There is mild dilatation of the ascending aorta, measuring 42 mm.  6. The inferior vena cava is normal in size with greater than 50% respiratory variability, suggesting right atrial pressure of 3 mmHg. FINDINGS  Left Ventricle: Left ventricular ejection fraction, by estimation, is 60 to 65%. The left ventricle has normal function. The left ventricle has no regional wall motion abnormalities. The average left ventricular global longitudinal strain is -27.6 %. The global longitudinal strain is normal. The left ventricular internal cavity size was normal in size. There is mild concentric left ventricular hypertrophy. Left  ventricular diastolic parameters are consistent with Grade I diastolic dysfunction (impaired relaxation). Normal left ventricular filling pressure. Right Ventricle: The right ventricular size is normal. No increase in right ventricular wall thickness. Right ventricular systolic function is normal. There is normal pulmonary artery systolic pressure. The tricuspid regurgitant velocity is 1.25 m/s, and  with an assumed right atrial pressure of 3 mmHg, the estimated right ventricular systolic pressure is 9.2 mmHg. Left Atrium: Left atrial size was normal in size. Right Atrium: Right atrial size was normal in size. Pericardium: Trivial pericardial effusion is present. Mitral Valve: The mitral valve is normal in structure. Trivial mitral valve regurgitation. No evidence of mitral valve stenosis. Tricuspid Valve: The tricuspid valve is normal in structure. Tricuspid valve regurgitation is trivial. No evidence of tricuspid stenosis. Aortic Valve: The aortic valve is tricuspid. Aortic valve regurgitation is not visualized. No aortic stenosis is present. Pulmonic Valve: The pulmonic valve was normal in structure. Pulmonic valve regurgitation is not visualized. No evidence of pulmonic stenosis. Aorta: Aortic dilatation noted. There is mild dilatation of the ascending aorta, measuring 42 mm. Venous: The inferior vena cava is normal in size with greater than 50% respiratory variability, suggesting right atrial pressure of 3 mmHg. IAS/Shunts: No atrial level shunt detected by color flow Doppler.  LEFT VENTRICLE PLAX 2D LVIDd:         3.80 cm  Diastology LVIDs:         2.80 cm  LV e' medial:    9.14 cm/s LV PW:         1.10 cm  LV E/e' medial:  7.7 LV IVS:        1.10 cm  LV e' lateral:   12.30 cm/s LVOT diam:     2.10 cm  LV E/e' lateral: 5.8 LV SV:         77 LV SV Index:   32       2D Longitudinal Strain LVOT Area:     3.46 cm 2D Strain GLS (A2C):   -25.7 %                         2D Strain GLS (A3C):   -27.7 %                          2D Strain GLS (A4C):   -29.3 %                         2D Strain GLS Avg:     -27.6 % RIGHT VENTRICLE RV S prime:     13.10 cm/s TAPSE (M-mode): 2.2 cm LEFT ATRIUM  Index       RIGHT ATRIUM          Index LA diam:        3.50 cm 1.43 cm/m  RA Area:     9.27 cm LA Vol (A2C):   30.3 ml 12.41 ml/m RA Volume:   15.70 ml 6.43 ml/m LA Vol (A4C):   32.8 ml 13.44 ml/m LA Biplane Vol: 33.8 ml 13.85 ml/m  AORTIC VALVE LVOT Vmax:   118.50 cm/s LVOT Vmean:  82.250 cm/s LVOT VTI:    0.224 m  AORTA Ao Root diam: 3.00 cm Ao Asc diam:  4.20 cm MITRAL VALVE               TRICUSPID VALVE MV Area (PHT): 4.31 cm    TR Peak grad:   6.2 mmHg MV Decel Time: 176 msec    TR Vmax:        125.00 cm/s MV E velocity: 70.80 cm/s MV A velocity: 73.20 cm/s  SHUNTS MV E/A ratio:  0.97        Systemic VTI:  0.22 m                            Systemic Diam: 2.10 cm Skeet Latch MD Electronically signed by Skeet Latch MD Signature Date/Time: 02/07/2021/4:03:16 PM    Final    MM CLIP PLACEMENT LEFT  Result Date: 01/20/2021 CLINICAL DATA:  Evaluate post biopsy marker clip placements following ultrasound-guided core needle biopsy of a left breast mass and stereotactic core needle biopsy of left breast calcifications. EXAM: DIAGNOSTIC LEFT MAMMOGRAM POST ULTRASOUND AND STEREOTACTIC BIOPSY COMPARISON:  Previous exam(s). FINDINGS: Mammographic images were obtained following ultrasound guided and stereotactic guided biopsy of the left breast. The biopsy marking clips are in expected position at the site of biopsy. The ribbon shaped biopsy clip lies within lateral aspect the left breast mass. The coil shaped biopsy clip lies in the expected location the lateral calcifications. IMPRESSION: Appropriate positioning of the coil and ribbon shaped biopsy marking clips at the site of biopsy in the left breast as detailed above. Final Assessment: Post Procedure Mammograms for Marker Placement Electronically Signed   By: Lajean Manes M.D.   On: 01/20/2021 10:19   MM CLIP PLACEMENT RIGHT  Result Date: 02/07/2021 CLINICAL DATA:  55 year old female status post stereotactic biopsy of the right breast. EXAM: DIAGNOSTIC RIGHT MAMMOGRAM POST STEREOTACTIC BIOPSY COMPARISON:  Previous exam(s). FINDINGS: Mammographic images were obtained following stereotactic guided biopsy of the right breast. The biopsy marking clip is in expected position at the site of biopsy. IMPRESSION: Appropriate positioning of the X shaped biopsy marking clip at the site of biopsy in the upper inner right breast posteriorly. Final Assessment: Post Procedure Mammograms for Marker Placement Electronically Signed   By: Kristopher Oppenheim M.D.   On: 02/07/2021 09:13   MM CLIP PLACEMENT RIGHT  Addendum Date: 01/16/2021   ADDENDUM REPORT: 01/16/2021 10:23 ADDENDUM: Of note, the mass in the right breast at 6:00, 1 cmfn with the ribbon shaped biopsy marking clip does correspond with the area of distortion identified mammographically. Electronically Signed   By: Ammie Ferrier M.D.   On: 01/16/2021 10:23   Result Date: 01/16/2021 CLINICAL DATA:  Post biopsy mammogram of the right breast for clip placement. EXAM: DIAGNOSTIC RIGHT MAMMOGRAM POST ULTRASOUND BIOPSY COMPARISON:  Previous exam(s). FINDINGS: Mammographic images were obtained following ultrasound guided biopsy of 2 sites in the right breast. The  biopsy marking clips are in expected position at the site of biopsy. The clips lie 3.2 cm apart. The small mass in the upper inner posterior right breast is again seen, but was not biopsied today. IMPRESSION: 1. Appropriate positioning of the ribbon shaped biopsy marking clip at the site of biopsy in the 6 o'clock position in the retroareolar right breast. 2. Appropriate positioning of the coil shaped biopsy marking clip at the site of biopsy in the right breast at the 9 o'clock position in the anterior right breast. 3.  The 2 biopsy marking clips are approximately 3.2 cm  apart. 4. The small mass in the upper inner quadrant, posterior depth is again seen on this mammogram, but was not biopsied today. See more detailed information in the body of the ultrasound-guided biopsy report. Recommendations regarding this mass will be determined following the biopsy results. Final Assessment: Post Procedure Mammograms for Marker Placement Electronically Signed: By: Ammie Ferrier M.D. 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 This was found to be concordant by Dr. Lajean Manes. 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 This was found to be concordant by Dr. Lajean Manes. Pathology results were discussed with the patient by telephone. The patient reported doing well after the biopsies with minimal tenderness and bleeding at the sites. Post biopsy instructions and care were reviewed and questions were answered. The patient was encouraged to call [facility] for any additional concerns. My direct phone number was provided. The patient has a recent diagnosis of RIGHT breast cancer and should follow her outlined treatment plan. The patient is scheduled for a BILATERAL breast MRI on Feb 01, 2021. Pathology results reported by Terie Purser, RN on 01/24/2021. Electronically Signed   By: Lajean Manes M.D.   On: 01/24/2021 14:14   Result Date: 01/24/2021 CLINICAL DATA:  Patient presents for stereotactic core needle biopsy of left breast calcifications. Procedure followed ultrasound-guided core needle biopsy a palpable left breast mass. Patient underwent ultrasound-guided core needle biopsy of 2 right breast masses on 01/16/2021, 1 revealing  invasive mammary carcinoma. EXAM: LEFT BREAST STEREOTACTIC CORE NEEDLE BIOPSY COMPARISON:  Previous exams. FINDINGS: The patient and I discussed the procedure of stereotactic-guided biopsy including benefits and alternatives. We discussed the high likelihood of a successful procedure. We discussed the risks of the procedure including infection, bleeding, tissue injury, clip migration, and inadequate sampling. Informed written consent was given. The usual time out protocol was performed immediately prior to the procedure. Using sterile technique and 1% Lidocaine as local anesthetic, under stereotactic guidance, a 9 gauge vacuum assisted device was used to perform core needle biopsy of calcifications in the central left breast slightly towards the upper-outer quadrant using a lateral approach. Specimen radiograph was performed showing multiple calcifications for which biopsy was performed. Specimens with calcifications are identified for pathology. Lesion quadrant: Upper outer quadrant At the conclusion of the procedure, coil shaped tissue marker clip was deployed into the biopsy cavity. Follow-up 2-view mammogram was performed and dictated separately. IMPRESSION: Stereotactic-guided biopsy of left breast calcifications. No apparent complications. Electronically Signed: By: Lajean Manes M.D. 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 This was found to be concordant by Dr. Lajean Manes. 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 This was found to be concordant by Dr. Lajean Manes. Pathology results were discussed with the patient by telephone. The patient reported doing well after the biopsies with  minimal tenderness and bleeding at the sites. Post biopsy instructions and care were reviewed and questions were answered. The patient was encouraged to call [facility] for any additional concerns. My direct phone number was provided. The patient has a recent diagnosis of RIGHT breast cancer and should follow her outlined treatment plan. The patient is scheduled for a BILATERAL breast MRI on Feb 01, 2021. Pathology results reported by Terie Purser, RN on 01/24/2021. Electronically Signed   By: Lajean Manes M.D.   On: 01/24/2021 14:14   Result Date: 01/24/2021 CLINICAL DATA:  Patient presents for an ultrasound-guided core needle biopsy a palpable left breast mass. She has undergone biopsy 2 right breast lesions on 01/16/2021, 1 of which revealed grade 2 invasive ductal carcinoma. Following today's ultrasound-guided core needle biopsy, the patient is to have a stereotactic core needle biopsy of left breast calcifications near the palpable left breast mass. EXAM: ULTRASOUND GUIDED LEFT BREAST CORE NEEDLE BIOPSY COMPARISON:  Previous exam(s). PROCEDURE: I met with the patient and we discussed the procedure of ultrasound-guided biopsy, including benefits and alternatives. We discussed the high likelihood of a successful procedure. We discussed the risks of the procedure, including infection, bleeding, tissue injury, clip migration, and inadequate sampling. Informed written consent was given. The usual time-out protocol was performed immediately prior to the procedure. Lesion quadrant: Lower outer quadrant Using sterile technique and 1% Lidocaine as local anesthetic, under direct ultrasound visualization, a 12 gauge spring-loaded device was used to perform biopsy of the palpable, 5.9 cm breast mass using an inferior approach. At the conclusion of the procedure a ribbon shaped tissue marker clip was deployed into the biopsy cavity. Follow up 2 view mammogram was performed and dictated  separately. IMPRESSION: Ultrasound guided biopsy of a left breast mass. No apparent complications. Electronically Signed: By: Lajean Manes M.D. On: 01/20/2021 09:27   MM RT BREAST BX W LOC DEV 1ST LESION IMAGE BX SPEC STEREO GUIDE  Result Date: 02/07/2021 CLINICAL DATA:  55 year old female with biopsy-proven bilateral breast cancer presents for stereotactic biopsy of an additional, indeterminate right breast mass. EXAM: RIGHT BREAST STEREOTACTIC CORE NEEDLE BIOPSY COMPARISON:  Previous exams. FINDINGS: The patient and I discussed the procedure of stereotactic-guided biopsy including benefits and alternatives. We discussed the high likelihood of a successful procedure. We discussed the risks of the procedure including infection, bleeding, tissue injury, clip migration, and inadequate sampling. Informed written consent was given. The usual time out protocol was performed immediately prior to the procedure. Using sterile technique and 1% Lidocaine as local anesthetic, under stereotactic guidance, a 9 gauge vacuum assisted device was used to perform core needle biopsy of a mass in the upper inner quadrant of the right breast using a superior approach. Lesion quadrant: Upper inner quadrant At the conclusion of the procedure, an X shaped tissue marker clip was deployed into the biopsy cavity. Follow-up 2-view mammogram was performed and dictated separately. IMPRESSION: Stereotactic-guided biopsy of the right breast. No  apparent complications. Electronically Signed   By: Kristopher Oppenheim M.D.   On: 02/07/2021 09:13   [place] RT BREAST BX W LOC DEV 1ST LESION IMG BX SPEC US GUIDE  Addendum Date: 01/17/2021   ADDENDUM REPORT: 01/17/2021 13:58 ADDENDUM: Pathology revealed GRADE II INVASIVE DUCTAL CARCINOMA, CALCIFICATIONS of the RIGHT breast, 6 o'clock, 1cmfn. This was found to be concordant by Dr. Ammie Ferrier. Pathology revealed FIBROCYSTIC CHANGE WITH ADENOSIS, CALCIFICATIONS AND A SMALL INTRADUCTAL PAPILLOMA of the  RIGHT breast, 9 o'clock, 1cmfn. This was found to be discordant by Dr. Ammie Ferrier, with excision recommended. Pathology results were discussed with the patient by telephone. The patient reported doing well after the biopsies with tenderness at the sites. Post biopsy instructions and care were reviewed and questions were answered. The patient was encouraged to call [facility] for any additional concerns. My direct phone number was provided. The patient was referred to [facility] on January 25, 2021. Recommendation for a bilateral breast MRI to evaluat for any additional sites of disease given the multiplicity of findings and heterogeneously dense breasts. The patient is scheduled for a LEFT breast stereotatic guided biopsy and a LEFT breast ultrasound guided biopsy at [facility] on January 20, 2021. Further recommendations will be guided by the results of these biopsies. NOTE: The small mass in the upper inner quadrant of the RIGHT breast at 1:00 o'clock, posterior depth, has not been biopsied. This may be biopsied using stereotatic guidance, after the MRI and LEFT breast biopsies have been performed, if this will alter treatment. Pathology results reported by Terie Purser, RN on 01/17/2021. Electronically Signed   By: Ammie Ferrier M.D.   On: 01/17/2021 13:58   Result Date: 01/17/2021 CLINICAL DATA:  55 year old female presenting for ultrasound-guided biopsy right breast. EXAM: ULTRASOUND GUIDED RIGHT BREAST CORE NEEDLE BIOPSY COMPARISON:  Previous exam(s). PROCEDURE: I met with the patient and we discussed the procedure of ultrasound-guided biopsy, including benefits and alternatives. We discussed the high likelihood of a successful procedure. We discussed the risks of the procedure, including infection, bleeding, tissue injury, clip migration, and inadequate sampling. Informed written  consent was given. The usual time-out protocol was performed immediately prior to the procedure. ----------------------------------------------------------------------------- In preparation for ultrasound-guided biopsy, the breast was again imaged sonographically. In the right breast at 1 o'clock, there were a few irregular near anechoic masses, similar appearance to that identified on the diagnostic ultrasound. It was not clear which of these corresponded with the mass seen mammographically. Therefore I did not biopsy this site as other more concerning findings were seen with additional scanning. In the right breast at 6 o'clock, 1 cm from the nipple, there is a highly suspicious irregular hypoechoic shadowing mass with indistinct and distorted margins measuring 1.4 x 0.8 x 1.0 cm. This is different from the retroareolar mass at 6 o'clock found on the diagnostic ultrasound. In the lower outer to retroareolar right breast, multiple prominent branching beaded ducts are identified, spanning at least 4 cm. The mass identified on the initial diagnostic ultrasound at 6 o'clock in the retroareolar right breast was identified, and has an appearance suggesting that it is part of this same process. Therefore, I selected a nodular portion of a duct at 9 o'clock, 1 cm from the nipple in order to biopsy something slightly more distant from the mass I found at 6 o'clock, 1 cm from the nipple. -------------------------------------------------------------------------------- Lesion  quadrant: Lower outer quadrant Using sterile technique and 1% Lidocaine as local anesthetic, under direct ultrasound visualization, a 14 gauge spring-loaded device was used to perform biopsy of a mass in the right breast at 6 o'clock, 1 cm from the nipple using an inferior approach. At the conclusion of the procedure a ribbon shaped tissue marker clip was deployed into the biopsy cavity. Lesion quadrant: Lower outer quadrant Using sterile technique and  1% Lidocaine as local anesthetic, under direct ultrasound visualization, a 14 gauge spring-loaded device was used to perform biopsy of a nodular portion of a duct in the right breast at 9 o'clock, 1 cm from the nipple using a in inferior approach. At the conclusion of the procedure a coil shaped tissue marker clip was deployed into the biopsy cavity. Follow up 2 view mammogram was performed and dictated separately. IMPRESSION: 1. Ultrasound guided biopsy of a right breast mass at 6 o'clock, 1 cm from the nipple. No apparent complications. 2. Ultrasound-guided biopsy of a right breast nodular portion of the duct at 9 o'clock, 1 cm from the nipple. No apparent complications. Electronically Signed: By: Ammie Ferrier M.D. On: 01/16/2021 10:01   [place] RT BREAST BX W LOC DEV EA ADD LESION IMG BX SPEC US GUIDE  Addendum Date: 01/17/2021   ADDENDUM REPORT: 01/17/2021 13:58 ADDENDUM: Pathology revealed GRADE II INVASIVE DUCTAL CARCINOMA, CALCIFICATIONS of the RIGHT breast, 6 o'clock, 1cmfn. This was found to be concordant by Dr. Ammie Ferrier. Pathology revealed FIBROCYSTIC CHANGE WITH ADENOSIS, CALCIFICATIONS AND A SMALL INTRADUCTAL PAPILLOMA of the RIGHT breast, 9 o'clock, 1cmfn. This was found to be discordant by Dr. Ammie Ferrier, with excision recommended. Pathology results were discussed with the patient by telephone. The patient reported doing well after the biopsies with tenderness at the sites. Post biopsy instructions and care were reviewed and questions were answered. The patient was encouraged to call [facility] for any additional concerns. My direct phone number was provided. The patient was referred to [facility] at [facility] on January 25, 2021. Recommendation for a bilateral breast MRI to evaluat for any additional sites of disease given the multiplicity of findings and heterogeneously dense breasts. The  patient is scheduled for a LEFT breast stereotatic guided biopsy and a LEFT breast ultrasound guided biopsy at [facility] on January 20, 2021. Further recommendations will be guided by the results of these biopsies. NOTE: The small mass in the upper inner quadrant of the RIGHT breast at 1:00 o'clock, posterior depth, has not been biopsied. This may be biopsied using stereotatic guidance, after the MRI and LEFT breast biopsies have been performed, if this will alter treatment. Pathology results reported by Terie Purser, RN on 01/17/2021. Electronically Signed   By: Ammie Ferrier M.D.   On: 01/17/2021 13:58   Result Date: 01/17/2021 CLINICAL DATA:  55 year old female presenting for ultrasound-guided biopsy right breast. EXAM: ULTRASOUND GUIDED RIGHT BREAST CORE NEEDLE BIOPSY COMPARISON:  Previous exam(s). PROCEDURE: I met with the patient and we discussed the procedure of ultrasound-guided biopsy, including benefits and alternatives. We discussed the high likelihood of a successful procedure. We discussed the risks of the procedure, including infection, bleeding, tissue injury, clip migration, and inadequate sampling. Informed written consent was given. The usual time-out protocol was performed immediately prior to the procedure. ----------------------------------------------------------------------------- In preparation for ultrasound-guided biopsy, the breast was again imaged sonographically. In the right breast at 1 o'clock, there were a  few irregular near anechoic masses, similar appearance to that identified on the diagnostic ultrasound. It was not clear which of these corresponded with the mass seen mammographically. Therefore I did not biopsy this site as other more concerning findings were seen with additional scanning. In the right breast at 6 o'clock, 1 cm from the nipple, there is a highly suspicious irregular hypoechoic shadowing mass with indistinct and distorted margins measuring 1.4 x  0.8 x 1.0 cm. This is different from the retroareolar mass at 6 o'clock found on the diagnostic ultrasound. In the lower outer to retroareolar right breast, multiple prominent branching beaded ducts are identified, spanning at least 4 cm. The mass identified on the initial diagnostic ultrasound at 6 o'clock in the retroareolar right breast was identified, and has an appearance suggesting that it is part of this same process. Therefore, I selected a nodular portion of a duct at 9 o'clock, 1 cm from the nipple in order to biopsy something slightly more distant from the mass I found at 6 o'clock, 1 cm from the nipple. -------------------------------------------------------------------------------- Lesion quadrant: Lower outer quadrant Using sterile technique and 1% Lidocaine as local anesthetic, under direct ultrasound visualization, a 14 gauge spring-loaded device was used to perform biopsy of a mass in the right breast at 6 o'clock, 1 cm from the nipple using an inferior approach. At the conclusion of the procedure a ribbon shaped tissue marker clip was deployed into the biopsy cavity. Lesion quadrant: Lower outer quadrant Using sterile technique and 1% Lidocaine as local anesthetic, under direct ultrasound visualization, a 14 gauge spring-loaded device was used to perform biopsy of a nodular portion of a duct in the right breast at 9 o'clock, 1 cm from the nipple using a in inferior approach. At the conclusion of the procedure a coil shaped tissue marker clip was deployed into the biopsy cavity. Follow up 2 view mammogram was performed and dictated separately. IMPRESSION: 1. Ultrasound guided biopsy of a right breast mass at 6 o'clock, 1 cm from the nipple. No apparent complications. 2. Ultrasound-guided biopsy of a right breast nodular portion of the duct at 9 o'clock, 1 cm from the nipple. No apparent complications. Electronically Signed: By: Ammie Ferrier M.D. On: 01/16/2021 10:01     ELIGIBLE FOR  AVAILABLE RESEARCH PROTOCOL:   ASSESSMENT: 55 y.o. 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  (a) secondary biopsy shows ductal carcinoma in situ associated with extensive patchy enhancement  (3) staging studies:  (a) chest CT scan 02/06/2021 shows hepatic steatosis and degenerative disc disease, no evidence of metastatic disease  (b) bone scan 02/06/2021 shows no evidence of metastatic disease  neoadjuvant chemotherapy will start 02/09/2021 consisting of pembrolizumab/carboplatin/paclitaxel followed by pembrolizumab/doxorubicin/cyclophosphamide  (4) definitive surgery to follow  (5) adjuvant radiation to bilateral sites  (6) genetics testing    PLAN: Leslie Wright is ready to start chemotherapy tomorrow 02/10/2021.  Her white cell count is due to a recent urinary  tract infection which is being treated and should not stop [place] from starting treatment.  She is doing well with her port.  She has had very favorable echocardiogram.  I showed her the images of her MRI and gave her a written copy of the report.  We hope that when we repeat the MRI at the end of treatment all those lesions will be not visible  Her chemotherapy is fairly complicated and she knows that tomorrow will be a "big day" when she gets her immunotherapy and her carbo dose as well as Taxol.  The day 8 and day 15 treatments are "small",  and she only gets Taxol on those days.  I have suggested she keep a symptom diary over the next week so that when she returns to meet with our APP on the day 8 treatment she can review those symptoms and troubleshoot them as needed.  We will be following for peripheral neuropathy of every cycle and if that develops of course we would have to make changes.  Currently she is scheduled for the first 2 cycles only.  Because of our nursing shortage she could not be treated today but we are going to switch her back to Thursday with subsequent treatments.  She understands that I am not here on Fridays though our APP is of course are covering in my partners Dr. Sonny Dandy and Burr Medico who also deal with breast cancer are available as needed  We are trying to get her Neupogen doses given on days 68, 64 and 19 given to her at [facility] which would save her a 30-40 drive here just for the  She knows to call for any other issue that may develop before then  Total encounter time 35 minutes.Sarajane Jews C. Demitrius Crass, MD 02/09/2021 9:04 AM Medical Oncology and Hematology [facility] [place] [address] Tel. [phone]    Fax. [phone]   This document serves as a record of services personally performed by Lurline Del, MD. It was created on his behalf by Wilburn Mylar, a trained medical scribe. The creation of this record is based on the scribe's personal observations and the provider's statements to them.   I, Lurline Del MD, have reviewed the above documentation for accuracy and completeness, and I agree with the above.   *Total Encounter Time as defined by the Centers for Medicare and Medicaid Services includes, in addition to the face-to-face time of a patient visit (documented in the note above) non-face-to-face time: obtaining and reviewing outside history, ordering and reviewing medications, tests or procedures, care coordination (communications with other  health care professionals or caregivers) and documentation in the medical record.

## 2021-02-08 NOTE — Discharge Instructions (Signed)
[facility] °Office Phone Number [phone] ° ° POST OP INSTRUCTIONS ° °Always review your discharge instruction sheet given to you by the facility where your surgery was performed. ° °IF YOU HAVE DISABILITY OR FAMILY LEAVE FORMS, YOU MUST BRING THEM TO THE OFFICE FOR PROCESSING.  DO NOT GIVE THEM TO YOUR DOCTOR. ° °1. A prescription for pain medication may be given to you upon discharge.  Take your pain medication as prescribed, if needed.  If narcotic pain medicine is not needed, then you may take acetaminophen (Tylenol) or ibuprofen (Advil) as needed. °2. Take your usually prescribed medications unless otherwise directed °3. If you need a refill on your pain medication, please contact your pharmacy.  They will contact our office to request authorization.  Prescriptions will not be filled after 5pm or on week-ends. °4. You should eat very light the first 24 hours after surgery, such as soup, crackers, pudding, etc.  Resume your normal diet the day after surgery °5. It is common to experience some constipation if taking pain medication after surgery.  Increasing fluid intake and taking a stool softener will usually help or prevent this problem from occurring.  A mild laxative (Milk of Magnesia or Miralax) should be taken according to package directions if there are no bowel movements after 48 hours. °6. You may shower in 48 hours.  The surgical glue will flake off in 2-3 weeks.   °7. ACTIVITIES:  No strenuous activity or heavy lifting for 1 week.   °a. You may drive when you no longer are taking prescription pain medication, you can comfortably wear a seatbelt, and you can safely maneuver your car and apply brakes. °b. RETURN TO WORK:  __________to be determined._______________ °You should see your doctor in the office for a follow-up appointment approximately three-four weeks after your surgery.   ° °WHEN TO CALL YOUR DOCTOR: °1. Fever over 101.0 °2. Nausea and/or vomiting. °3. Extreme swelling  or bruising. °4. Continued bleeding from incision. °5. Increased pain, redness, or drainage from the incision. ° °The clinic staff is available to answer your questions during regular business hours.  Please don’t hesitate to call and ask to speak to one of the nurses for clinical concerns.  If you have a medical emergency, go to the nearest emergency room or call 911.  A surgeon from [facility] is always on call at the hospital. ° °For further questions, please visit [email or web address]  ° °

## 2021-02-08 NOTE — H&P (View-Only) (Signed)
Leslie Wright is an 55 y.o. female.   Chief Complaint: bilateral breast cancer HPI:  Pt presents with bilateral breast cancer.  The left side is relatively large.  She will need chemo and we will do neoadjuvant tx.    Past Medical History:  Diagnosis Date  . Acid reflux   . Anemia   . Arthritis    "maybe in my fingers" (03/26/2018)  . Breast cancer ([place]) 12/2020  . Family history of adverse reaction to anesthesia    "brother, Otila Kluver, and my mom always get sick" (03/26/2018)  . Family history of brain cancer   . Family history of breast cancer   . Family history of colon cancer   . Family history of lung cancer   . Family history of pancreatic cancer   . Family history of prostate cancer   . Gallbladder problem   . Headache   . High blood pressure   . High cholesterol   . Hip pain    "left; before OR" (03/26/2018)  . History of kidney stones   . OSA (obstructive sleep apnea)    "can't tolerate the mask" (03/26/2018)  . PONV (postoperative nausea and vomiting)   . Pre-diabetes   . Shortness of breath   . Swelling of lower extremity    "that's why they put me on fluid pill" (03/26/2018)    Past Surgical History:  Procedure Laterality Date  . CARPAL TUNNEL RELEASE Right ~ 2006  . EYE SURGERY    . JOINT REPLACEMENT    . LAPAROSCOPIC CHOLECYSTECTOMY  12/2010  . LASIK Bilateral 2000  . TOTAL HIP ARTHROPLASTY Left 03/25/2018   Procedure: LEFT TOTAL HIP ARTHROPLASTY ANTERIOR APPROACH;  Surgeon: Mcarthur Rossetti, MD;  Location: [facility];  Service: Orthopedics;  Laterality: Left;    Family History  Problem Relation Age of Onset  . Diabetes Mother   . Thyroid disease Mother   . Liver disease Mother   . Obesity Mother   . Hypertension Father   . Hyperlipidemia Father   . Heart disease Father   . Stroke Father   . Obesity Father   . Colon polyps Father   . Prostate cancer Brother   . Brain cancer Maternal Grandfather   . Pancreatic cancer Paternal Grandmother   . Colon  cancer Neg Hx        No one in family with colon cancer.   Social History:  reports that she has quit smoking. She has quit using smokeless tobacco.  Her smokeless tobacco use included chew. She reports current alcohol use. She reports that she does not use drugs.  Allergies: No Known Allergies  Medications Prior to Admission  Medication Sig Dispense Refill  . acetaminophen (TYLENOL) 500 MG tablet Take 1,000 mg by mouth every 6 (six) hours as needed for moderate pain.    Marland Kitchen aspirin EC 81 MG tablet Take 81 mg by mouth daily.    . Cholecalciferol (VITAMIN D) 50 MCG (2000 UT) CAPS Take 2,000 Units by mouth daily.    Marland Kitchen ELDERBERRY PO Take 1 capsule by mouth daily.    . folic acid (FOLVITE) 1 MG tablet Take 1 mg by mouth daily.    Marland Kitchen ketoconazole (NIZORAL) 2 % cream Apply 1 application topically daily. 15 g 0  . losartan (COZAAR) 100 MG tablet Take 1 tablet by mouth daily 90 tablet 3  . losartan (COZAAR) 50 MG tablet TAKE 1 TABLET BY MOUTH ONCE A DAY FOR HIGH BLOOD PRESSURE (Patient taking  differently: Take 100 mg by mouth daily.) 90 tablet 0  . methotrexate (RHEUMATREX) 15 MG tablet Take 15 mg by mouth every Wednesday. Caution: Chemotherapy. Protect from light.    . Multiple Vitamin (MULTIVITAMIN WITH MINERALS) TABS tablet Take 1 tablet by mouth daily.    . rosuvastatin (CRESTOR) 10 MG tablet TAKE 1 TABLET BY MOUTH ONCE A DAY AT BEDTIME FOR CHOLESTEROL (Patient taking differently: Take 10 mg by mouth at bedtime.) 90 tablet 1  . Blood Glucose Monitoring Suppl (FREESTYLE LITE) w/Device KIT CHECK GLUCOSE 2-3 TIMES DAILY 1 kit 0  . ciprofloxacin (CIPRO) 500 MG tablet Take 1 tablet by mouth twice a day for 3 days for bladder infection 6 tablet 0  . glucose blood test strip USE AS DIRECTED TO CHECK BLOOD SUGAR 1 TO 3 TIMES A DAY (Patient taking differently: USE AS DIRECTED TO CHECK BLOOD SUGAR 1 TO 3 TIMES A DAY) 100 strip 3  . Lancets (FREESTYLE) lancets USE AS DIRECTED TO CHECK BLOOD SUGAR 2 TO 3 TIMES A  DAY (Patient taking differently: USE AS DIRECTED TO CHECK BLOOD SUGAR 2 TO 3 TIMES A DAY) 100 each 3    Results for orders placed or performed during the hospital encounter of 02/08/21 (from the past 48 hour(s))  Glucose, capillary     Status: Abnormal   Collection Time: 02/08/21  6:59 AM  Result Value Ref Range   Glucose-Capillary 128 (H) 70 - 99 mg/dL    Comment: Glucose reference range applies only to samples taken after fasting for at least 8 hours.   CT Chest W Contrast  Result Date: 02/06/2021 CLINICAL DATA:  Staging locally advanced breast cancer. EXAM: CT CHEST WITH CONTRAST TECHNIQUE: Multidetector CT imaging of the chest was performed during intravenous contrast administration. CONTRAST:  85m OMNIPAQUE IOHEXOL 300 MG/ML  SOLN COMPARISON:  None FINDINGS: Cardiovascular: No significant vascular findings. Normal heart size. No pericardial effusion. Mediastinum/Nodes: No enlarged mediastinal, hilar, or axillary lymph nodes. Thyroid gland, trachea, and esophagus demonstrate no significant findings. Lungs/Pleura: No pleural effusion. No airspace consolidation, atelectasis, or pneumothorax. Calcified granuloma identified in the superior segment of right lower lobe, image 44/10. No suspicious lung nodules. Upper Abdomen: No acute abnormality. Previous cholecystectomy. Subjective diffuse hepatic steatosis. Musculoskeletal: Degenerative disc disease identified within the thoracic spine. No acute or suspicious osseous findings. Left breast mass is again noted measuring 3.9 x 3.9 by 4.3 cm. IMPRESSION: 1. No acute cardiopulmonary abnormalities. No findings to suggest metastatic disease to the chest. 2. Left breast mass compatible with known primary breast carcinoma. 3. Hepatic steatosis. Electronically Signed   By: TKerby MoorsM.D.   On: 02/06/2021 15:55   NM Bone Scan Whole Body  Result Date: 02/07/2021 CLINICAL DATA:  Bilateral breast cancer EXAM: NUCLEAR MEDICINE WHOLE BODY BONE SCAN TECHNIQUE:  Whole body anterior and posterior images were obtained approximately 3 hours after intravenous injection of radiopharmaceutical. RADIOPHARMACEUTICALS:  20.5 mCi Technetium-982mDP IV COMPARISON:  02/06/2021 FINDINGS: Anterior and posterior whole body planar images are obtained. Physiologic excretion of radiotracer is seen within the kidneys and bladder. Degenerative type activity is seen within the bilateral shoulders, left wrist, bilateral knees, bilateral ankles. There is no abnormal radiotracer activity within the bony structures to suggest an acute or destructive process. Specifically, no evidence of bony metastatic disease. Incidental note is made of asymmetric radiotracer activity in the region of the upper outer left breast, likely uptake within the patient's known left breast cancer. IMPRESSION: 1. No evidence of bony metastatic disease.  2. Likely soft tissue uptake radiotracer in the left breast corresponding to the patient's known left breast cancer. 3. Degenerative activity as above. Electronically Signed   By: Randa Ngo M.D.   On: 02/07/2021 17:18   US BREAST LTD UNI RIGHT INC AXILLA  Result Date: 02/07/2021 CLINICAL DATA:  55 year old female with biopsy-proven bilateral breast cancer presents for second-look ultrasound of the right axilla after a focal area of borderline cortical thickening was questioned on recent MRI. EXAM: ULTRASOUND OF THE RIGHT AXILLA COMPARISON:  Previous exam(s). FINDINGS: Ultrasound is performed, showing a questioned focal bulge of a bilobed lymph node deep in the right axilla with borderline thickening up to 3-4 mm. Multiple attempts at repositioning the patient were made. However, the lymph node remained deep within the axilla and surrounded by large vessels. A safe window for targeted biopsy could not be reproduced. IMPRESSION: Borderline focal cortical thickening of a deep right axillary lymph node measuring 3-4 mm. Multiple attempts at repositioning the patient  were made. However, a safe window for targeted biopsy could not be reproduced. RECOMMENDATION: Recommendation is to attempt ultrasound-guided seed localization of the questioned lymph node for excision at the time of the patient's definitive surgery. I have discussed the findings and recommendations with the patient. If applicable, a reminder letter will be sent to the patient regarding the next appointment. BI-RADS CATEGORY  4: Suspicious. Electronically Signed   By: Kristopher Oppenheim M.D.   On: 02/07/2021 09:30   ECHOCARDIOGRAM COMPLETE  Result Date: 02/07/2021    ECHOCARDIOGRAM REPORT   Patient Name:   Leslie Wright Date of Exam: 02/07/2021 Medical Rec #:  [id number]      Height:       67.0 in Accession #:    [id number]     Weight:       311.4 lb Date of Birth:  12-Dec-1965      BSA:          2.441 m Patient Age:    30 years       BP:           147/81 mmHg Patient Gender: F              HR:           78 bpm. Exam Location:  Outpatient Procedure: 2D Echo, Color Doppler, Cardiac Doppler and Strain Analysis Indications:    Chemo Z09  History:        Patient has prior history of Echocardiogram examinations, most                 recent 12/03/2018. Risk Factors:Hypertension.  Sonographer:    Bernadene Person RDCS Referring Phys: [place]  1. Left ventricular ejection fraction, by estimation, is 60 to 65%. The left ventricle has normal function. The left ventricle has no regional wall motion abnormalities. There is mild concentric left ventricular hypertrophy. Left ventricular diastolic parameters are consistent with Grade I diastolic dysfunction (impaired relaxation). The average left ventricular global longitudinal strain is -27.6 %. The global longitudinal strain is normal.  2. Right ventricular systolic function is normal. The right ventricular size is normal. There is normal pulmonary artery systolic pressure.  3. The mitral valve is normal in structure. Trivial mitral valve regurgitation. No  evidence of mitral stenosis.  4. The aortic valve is tricuspid. Aortic valve regurgitation is not visualized. No aortic stenosis is present.  5. Aortic dilatation noted. There is mild dilatation of the ascending aorta, measuring 42  mm.  6. The inferior vena cava is normal in size with greater than 50% respiratory variability, suggesting right atrial pressure of 3 mmHg. FINDINGS  Left Ventricle: Left ventricular ejection fraction, by estimation, is 60 to 65%. The left ventricle has normal function. The left ventricle has no regional wall motion abnormalities. The average left ventricular global longitudinal strain is -27.6 %. The global longitudinal strain is normal. The left ventricular internal cavity size was normal in size. There is mild concentric left ventricular hypertrophy. Left ventricular diastolic parameters are consistent with Grade I diastolic dysfunction (impaired relaxation). Normal left ventricular filling pressure. Right Ventricle: The right ventricular size is normal. No increase in right ventricular wall thickness. Right ventricular systolic function is normal. There is normal pulmonary artery systolic pressure. The tricuspid regurgitant velocity is 1.25 m/s, and  with an assumed right atrial pressure of 3 mmHg, the estimated right ventricular systolic pressure is 9.2 mmHg. Left Atrium: Left atrial size was normal in size. Right Atrium: Right atrial size was normal in size. Pericardium: Trivial pericardial effusion is present. Mitral Valve: The mitral valve is normal in structure. Trivial mitral valve regurgitation. No evidence of mitral valve stenosis. Tricuspid Valve: The tricuspid valve is normal in structure. Tricuspid valve regurgitation is trivial. No evidence of tricuspid stenosis. Aortic Valve: The aortic valve is tricuspid. Aortic valve regurgitation is not visualized. No aortic stenosis is present. Pulmonic Valve: The pulmonic valve was normal in structure. Pulmonic valve regurgitation is  not visualized. No evidence of pulmonic stenosis. Aorta: Aortic dilatation noted. There is mild dilatation of the ascending aorta, measuring 42 mm. Venous: The inferior vena cava is normal in size with greater than 50% respiratory variability, suggesting right atrial pressure of 3 mmHg. IAS/Shunts: No atrial level shunt detected by color flow Doppler.  LEFT VENTRICLE PLAX 2D LVIDd:         3.80 cm  Diastology LVIDs:         2.80 cm  LV e' medial:    9.14 cm/s LV PW:         1.10 cm  LV E/e' medial:  7.7 LV IVS:        1.10 cm  LV e' lateral:   12.30 cm/s LVOT diam:     2.10 cm  LV E/e' lateral: 5.8 LV SV:         77 LV SV Index:   32       2D Longitudinal Strain LVOT Area:     3.46 cm 2D Strain GLS (A2C):   -25.7 %                         2D Strain GLS (A3C):   -27.7 %                         2D Strain GLS (A4C):   -29.3 %                         2D Strain GLS Avg:     -27.6 % RIGHT VENTRICLE RV S prime:     13.10 cm/s TAPSE (M-mode): 2.2 cm LEFT ATRIUM             Index       RIGHT ATRIUM          Index LA diam:        3.50 cm 1.43 cm/m  RA Area:  9.27 cm LA Vol (A2C):   30.3 ml 12.41 ml/m RA Volume:   15.70 ml 6.43 ml/m LA Vol (A4C):   32.8 ml 13.44 ml/m LA Biplane Vol: 33.8 ml 13.85 ml/m  AORTIC VALVE LVOT Vmax:   118.50 cm/s LVOT Vmean:  82.250 cm/s LVOT VTI:    0.224 m  AORTA Ao Root diam: 3.00 cm Ao Asc diam:  4.20 cm MITRAL VALVE               TRICUSPID VALVE MV Area (PHT): 4.31 cm    TR Peak grad:   6.2 mmHg MV Decel Time: 176 msec    TR Vmax:        125.00 cm/s MV E velocity: 70.80 cm/s MV A velocity: 73.20 cm/s  SHUNTS MV E/A ratio:  0.97        Systemic VTI:  0.22 m                            Systemic Diam: 2.10 cm Skeet Latch MD Electronically signed by Skeet Latch MD Signature Date/Time: 02/07/2021/4:03:16 PM    Final    MM CLIP PLACEMENT RIGHT  Result Date: 02/07/2021 CLINICAL DATA:  55 year old female status post stereotactic biopsy of the right breast. EXAM: DIAGNOSTIC RIGHT  MAMMOGRAM POST STEREOTACTIC BIOPSY COMPARISON:  Previous exam(s). FINDINGS: Mammographic images were obtained following stereotactic guided biopsy of the right breast. The biopsy marking clip is in expected position at the site of biopsy. IMPRESSION: Appropriate positioning of the X shaped biopsy marking clip at the site of biopsy in the upper inner right breast posteriorly. Final Assessment: Post Procedure Mammograms for Marker Placement Electronically Signed   By: Kristopher Oppenheim M.D.   On: 02/07/2021 09:13   MM RT BREAST BX W LOC DEV 1ST LESION IMAGE BX SPEC STEREO GUIDE  Result Date: 02/07/2021 CLINICAL DATA:  55 year old female with biopsy-proven bilateral breast cancer presents for stereotactic biopsy of an additional, indeterminate right breast mass. EXAM: RIGHT BREAST STEREOTACTIC CORE NEEDLE BIOPSY COMPARISON:  Previous exams. FINDINGS: The patient and I discussed the procedure of stereotactic-guided biopsy including benefits and alternatives. We discussed the high likelihood of a successful procedure. We discussed the risks of the procedure including infection, bleeding, tissue injury, clip migration, and inadequate sampling. Informed written consent was given. The usual time out protocol was performed immediately prior to the procedure. Using sterile technique and 1% Lidocaine as local anesthetic, under stereotactic guidance, a 9 gauge vacuum assisted device was used to perform core needle biopsy of a mass in the upper inner quadrant of the right breast using a superior approach. Lesion quadrant: Upper inner quadrant At the conclusion of the procedure, an X shaped tissue marker clip was deployed into the biopsy cavity. Follow-up 2-view mammogram was performed and dictated separately. IMPRESSION: Stereotactic-guided biopsy of the right breast. No apparent complications. Electronically Signed   By: Kristopher Oppenheim M.D.   On: 02/07/2021 09:13    Review of Systems  All other systems reviewed and are  negative.   Blood pressure (!) 160/99, pulse 77, temperature 97.7 F (36.5 C), temperature source Oral, resp. rate 17, SpO2 98 %. Physical Exam Vitals reviewed.  Constitutional:      Appearance: Normal appearance.  HENT:     Head: Normocephalic and atraumatic.     Right Ear: External ear normal.     Left Ear: External ear normal.     Nose: No congestion or rhinorrhea.  Eyes:  General: No scleral icterus.    Extraocular Movements: Extraocular movements intact.     Pupils: Pupils are equal, round, and reactive to light.  Cardiovascular:     Rate and Rhythm: Normal rate and regular rhythm.     Pulses: Normal pulses.  Pulmonary:     Effort: Pulmonary effort is normal.  Abdominal:     Palpations: Abdomen is soft.     Tenderness: There is no abdominal tenderness.  Musculoskeletal:     Cervical back: Normal range of motion and neck supple.  Skin:    General: Skin is warm and dry.     Capillary Refill: Capillary refill takes 2 to 3 seconds.     Coloration: Skin is not pale.     Findings: No erythema.  Neurological:     General: No focal deficit present.     Mental Status: She is alert and oriented to person, place, and time.  Psychiatric:        Mood and Affect: Mood normal.        Behavior: Behavior normal.        Thought Content: Thought content normal.        Judgment: Judgment normal.      Assessment/Plan Bilateral breast cancer  Plan port a cath. Chemo to start Friday.  Discussed risks.  Questions answered.  Will not leave accessed due to patient's animals.    Stark Klein, MD 02/08/2021, 9:27 AM

## 2021-02-08 NOTE — Transfer of Care (Signed)
Immediate Anesthesia Transfer of Care Note  Patient: Leslie Wright  Procedure(s) Performed: INSERTION PORT-A-CATH (N/A Breast)  Patient Location: PACU  Anesthesia Type:General  Level of Consciousness: awake, alert , oriented and patient cooperative  Airway & Oxygen Therapy: Patient Spontanous Breathing and Patient connected to face mask  Post-op Assessment: Report given to RN and Post -op Vital signs reviewed and stable  Post vital signs: Reviewed and stable  Last Vitals:  Vitals Value Taken Time  BP    Temp    Pulse 66 02/08/21 1121  Resp 12 02/08/21 1121  SpO2 98 % 02/08/21 1121  Vitals shown include unvalidated device data.  Last Pain:  Vitals:   02/08/21 0651  TempSrc:   PainSc: 0-No pain         Complications: No complications documented.

## 2021-02-08 NOTE — Progress Notes (Signed)
Met with patient at registration to introduce myself as Financial Resource Specialist and to offer available resources.  Discussed one-time $1000 [employer] grant and qualifications to assist with personal expenses while going through treatment.  Gave her my card if interested in applying and for any additional financial questions or concerns.   

## 2021-02-08 NOTE — Anesthesia Postprocedure Evaluation (Signed)
Anesthesia Post Note  Patient: OVEDA DADAMO  Procedure(s) Performed: INSERTION PORT-A-CATH (N/A Breast)     Patient location during evaluation: PACU Anesthesia Type: General Level of consciousness: awake and alert Pain management: pain level controlled Vital Signs Assessment: post-procedure vital signs reviewed and stable Respiratory status: spontaneous breathing, nonlabored ventilation, respiratory function stable and patient connected to nasal cannula oxygen Cardiovascular status: blood pressure returned to baseline and stable Postop Assessment: no apparent nausea or vomiting Anesthetic complications: no   No complications documented.  Last Vitals:  Vitals:   02/08/21 1200 02/08/21 1205  BP: 120/88 125/81  Pulse: 71 71  Resp: (!) 22 20  Temp: 36.4 C 36.6 C  SpO2: 94% 94%    Last Pain:  Vitals:   02/08/21 1205  TempSrc:   PainSc: 0-No pain                 Effie Berkshire

## 2021-02-08 NOTE — Anesthesia Procedure Notes (Signed)
Procedure Name: Intubation Date/Time: 02/08/2021 7:40 AM Performed by: Sharlette Dense, CRNA Patient Re-evaluated:Patient Re-evaluated prior to induction Oxygen Delivery Method: Circle system utilized Preoxygenation: Pre-oxygenation with 100% oxygen Induction Type: IV induction Ventilation: Mask ventilation without difficulty Laryngoscope Size: Miller and 2 Grade View: Grade I Tube type: Oral Tube size: 7.5 mm Number of attempts: 1 Airway Equipment and Method: Stylet Placement Confirmation: ETT inserted through vocal cords under direct vision,  positive ETCO2 and breath sounds checked- equal and bilateral Secured at: 22 cm Tube secured with: Tape Dental Injury: Teeth and Oropharynx as per pre-operative assessment

## 2021-02-09 ENCOUNTER — Inpatient Hospital Stay: Payer: 59 | Admitting: Oncology

## 2021-02-09 ENCOUNTER — Inpatient Hospital Stay: Payer: 59

## 2021-02-09 ENCOUNTER — Other Ambulatory Visit (HOSPITAL_COMMUNITY): Payer: Self-pay

## 2021-02-09 ENCOUNTER — Other Ambulatory Visit: Payer: Self-pay | Admitting: Oncology

## 2021-02-09 VITALS — BP 118/49 | HR 76 | Temp 97.9°F | Resp 20 | Ht 67.0 in | Wt 311.6 lb

## 2021-02-09 DIAGNOSIS — C50511 Malignant neoplasm of lower-outer quadrant of right female breast: Secondary | ICD-10-CM | POA: Diagnosis not present

## 2021-02-09 DIAGNOSIS — Z170 Estrogen receptor positive status [ER+]: Secondary | ICD-10-CM

## 2021-02-09 DIAGNOSIS — C50812 Malignant neoplasm of overlapping sites of left female breast: Secondary | ICD-10-CM

## 2021-02-09 DIAGNOSIS — Z171 Estrogen receptor negative status [ER-]: Secondary | ICD-10-CM

## 2021-02-09 DIAGNOSIS — K760 Fatty (change of) liver, not elsewhere classified: Secondary | ICD-10-CM | POA: Diagnosis not present

## 2021-02-09 DIAGNOSIS — Z5111 Encounter for antineoplastic chemotherapy: Secondary | ICD-10-CM | POA: Diagnosis not present

## 2021-02-09 DIAGNOSIS — C50811 Malignant neoplasm of overlapping sites of right female breast: Secondary | ICD-10-CM | POA: Diagnosis present

## 2021-02-09 DIAGNOSIS — Z87891 Personal history of nicotine dependence: Secondary | ICD-10-CM | POA: Diagnosis not present

## 2021-02-09 DIAGNOSIS — M539 Dorsopathy, unspecified: Secondary | ICD-10-CM

## 2021-02-09 DIAGNOSIS — Z79899 Other long term (current) drug therapy: Secondary | ICD-10-CM | POA: Diagnosis not present

## 2021-02-09 DIAGNOSIS — Z5112 Encounter for antineoplastic immunotherapy: Secondary | ICD-10-CM | POA: Diagnosis not present

## 2021-02-09 LAB — COMPREHENSIVE METABOLIC PANEL
ALT: 15 U/L (ref 0–44)
AST: 13 U/L — ABNORMAL LOW (ref 15–41)
Albumin: 3.3 g/dL — ABNORMAL LOW (ref 3.5–5.0)
Alkaline Phosphatase: 71 U/L (ref 38–126)
Anion gap: 11 (ref 5–15)
BUN: 23 mg/dL — ABNORMAL HIGH (ref 6–20)
CO2: 27 mmol/L (ref 22–32)
Calcium: 9.6 mg/dL (ref 8.9–10.3)
Chloride: 105 mmol/L (ref 98–111)
Creatinine, Ser: 1.44 mg/dL — ABNORMAL HIGH (ref 0.44–1.00)
GFR, Estimated: 43 mL/min — ABNORMAL LOW (ref >60)
Glucose, Bld: 140 mg/dL — ABNORMAL HIGH (ref 70–99)
Potassium: 5.2 mmol/L — ABNORMAL HIGH (ref 3.5–5.1)
Sodium: 143 mmol/L (ref 135–145)
Total Bilirubin: <0.2 mg/dL — ABNORMAL LOW (ref 0.3–1.2)
Total Protein: 7 g/dL (ref 6.5–8.1)

## 2021-02-09 LAB — CBC WITH DIFFERENTIAL/PLATELET
Abs Immature Granulocytes: 0.15 10*3/uL — ABNORMAL HIGH (ref 0.00–0.07)
Basophils Absolute: 0 10*3/uL (ref 0.0–0.1)
Basophils Relative: 0 %
Eosinophils Absolute: 0 10*3/uL (ref 0.0–0.5)
Eosinophils Relative: 0 %
HCT: 38.1 % (ref 36.0–46.0)
Hemoglobin: 12 g/dL (ref 12.0–15.0)
Immature Granulocytes: 1 %
Lymphocytes Relative: 10 %
Lymphs Abs: 1.8 10*3/uL (ref 0.7–4.0)
MCH: 27.9 pg (ref 26.0–34.0)
MCHC: 31.5 g/dL (ref 30.0–36.0)
MCV: 88.6 fL (ref 80.0–100.0)
Monocytes Absolute: 1.4 10*3/uL — ABNORMAL HIGH (ref 0.1–1.0)
Monocytes Relative: 8 %
Neutro Abs: 14.8 10*3/uL — ABNORMAL HIGH (ref 1.7–7.7)
Neutrophils Relative %: 81 %
Platelets: 287 10*3/uL (ref 150–400)
RBC: 4.3 MIL/uL (ref 3.87–5.11)
RDW: 16.2 % — ABNORMAL HIGH (ref 11.5–15.5)
WBC: 18.2 10*3/uL — ABNORMAL HIGH (ref 4.0–10.5)
nRBC: 0 % (ref 0.0–0.2)

## 2021-02-09 NOTE — Progress Notes (Signed)
Pharmacist Chemotherapy Monitoring - Initial Assessment    Anticipated start date: 02/10/21   Regimen:  . Are orders appropriate based on the patient's diagnosis, regimen, and cycle? Yes . Does the plan date match the patient's scheduled date? Yes . Is the sequencing of drugs appropriate? Yes . Are the premedications appropriate for the patient's regimen? Yes . Prior Authorization for treatment is: Approved o If applicable, is the correct biosimilar selected based on the patient's insurance? yes  Organ Function and Labs: Marland Kitchen Are dose adjustments needed based on the patient's renal function, hepatic function, or hematologic function? Yes - follow SCr on Carbo . Are appropriate labs ordered prior to the start of patient's treatment? Yes . Other organ system assessment, if indicated: anthracyclines: Echo/ MUGA . The following baseline labs, if indicated, have been ordered: pembrolizumab: baseline TSH +/- T4  Dose Assessment: . Are the drug doses appropriate? Yes . Are the following correct: o Drug concentrations Yes o IV fluid compatible with drug Yes o Administration routes Yes o Timing of therapy Yes . If applicable, does the patient have documented access for treatment and/or plans for port-a-cath placement? yes . If applicable, have lifetime cumulative doses been properly documented and assessed? yes Lifetime Dose Tracking  No doses have been documented on this patient for the following tracked chemicals: Doxorubicin, Epirubicin, Idarubicin, Daunorubicin, Mitoxantrone, Bleomycin, Oxaliplatin, Carboplatin, Liposomal Doxorubicin  o   Toxicity Monitoring/Prevention: . The patient has the following take home antiemetics prescribed: Ondansetron, Prochlorperazine and Dexamethasone . The patient has the following take home medications prescribed: N/A . Medication allergies and previous infusion related reactions, if applicable, have been reviewed and addressed. Yes The patient's current  medication list has been assessed for drug-drug interactions with their chemotherapy regimen. Pt takes Methotrexate.  Will check w/ Dr. Jana Hakim if he's ok'd for her to continue this.  Order Review: . Are the treatment plan orders signed? Yes . Is the patient scheduled to see a provider prior to their treatment? No  I verify that I have reviewed each item in the above checklist and answered each question accordingly.   Kennith Center, Pharm.D., CPP 02/09/2021@3 :03 PM

## 2021-02-10 ENCOUNTER — Encounter (HOSPITAL_COMMUNITY): Payer: Self-pay | Admitting: General Surgery

## 2021-02-10 ENCOUNTER — Other Ambulatory Visit (HOSPITAL_COMMUNITY): Payer: Self-pay | Admitting: General Surgery

## 2021-02-10 ENCOUNTER — Ambulatory Visit (HOSPITAL_COMMUNITY)
Admission: RE | Admit: 2021-02-10 | Discharge: 2021-02-10 | Disposition: A | Discharge status: Home / Self Care | Payer: 59 | Source: Ambulatory Visit | Attending: Hematology and Oncology | Admitting: Hematology and Oncology

## 2021-02-10 ENCOUNTER — Inpatient Hospital Stay: Payer: 59

## 2021-02-10 ENCOUNTER — Other Ambulatory Visit: Payer: Self-pay | Admitting: *Deleted

## 2021-02-10 ENCOUNTER — Encounter: Payer: Self-pay | Admitting: *Deleted

## 2021-02-10 ENCOUNTER — Other Ambulatory Visit: Payer: Self-pay

## 2021-02-10 VITALS — BP 124/76 | HR 63 | Temp 97.6°F | Resp 18

## 2021-02-10 DIAGNOSIS — Z95828 Presence of other vascular implants and grafts: Secondary | ICD-10-CM

## 2021-02-10 DIAGNOSIS — Z5112 Encounter for antineoplastic immunotherapy: Secondary | ICD-10-CM | POA: Diagnosis not present

## 2021-02-10 DIAGNOSIS — C50812 Malignant neoplasm of overlapping sites of left female breast: Secondary | ICD-10-CM | POA: Diagnosis not present

## 2021-02-10 DIAGNOSIS — Z171 Estrogen receptor negative status [ER-]: Secondary | ICD-10-CM

## 2021-02-10 DIAGNOSIS — Z170 Estrogen receptor positive status [ER+]: Secondary | ICD-10-CM | POA: Diagnosis not present

## 2021-02-10 DIAGNOSIS — Z87891 Personal history of nicotine dependence: Secondary | ICD-10-CM | POA: Diagnosis not present

## 2021-02-10 DIAGNOSIS — J986 Disorders of diaphragm: Secondary | ICD-10-CM | POA: Diagnosis not present

## 2021-02-10 DIAGNOSIS — Z79899 Other long term (current) drug therapy: Secondary | ICD-10-CM | POA: Diagnosis not present

## 2021-02-10 DIAGNOSIS — Z452 Encounter for adjustment and management of vascular access device: Secondary | ICD-10-CM | POA: Diagnosis not present

## 2021-02-10 DIAGNOSIS — C50511 Malignant neoplasm of lower-outer quadrant of right female breast: Secondary | ICD-10-CM

## 2021-02-10 DIAGNOSIS — Z5111 Encounter for antineoplastic chemotherapy: Secondary | ICD-10-CM | POA: Diagnosis not present

## 2021-02-10 MED ORDER — FAMOTIDINE 20 MG IN NS 100 ML IVPB
INTRAVENOUS | Status: AC
  Filled 2021-02-10: qty 100

## 2021-02-10 MED ORDER — SODIUM CHLORIDE 0.9 % IV SOLN
200.0000 mg | Freq: Once | INTRAVENOUS | Status: AC
  Administered 2021-02-10: 200 mg via INTRAVENOUS
  Filled 2021-02-10: qty 8

## 2021-02-10 MED ORDER — DIPHENHYDRAMINE HCL 50 MG/ML IJ SOLN
INTRAMUSCULAR | Status: AC
  Filled 2021-02-10: qty 1

## 2021-02-10 MED ORDER — DIPHENHYDRAMINE HCL 50 MG/ML IJ SOLN
25.0000 mg | Freq: Once | INTRAMUSCULAR | Status: AC
  Administered 2021-02-10: 25 mg via INTRAVENOUS

## 2021-02-10 MED ORDER — PALONOSETRON HCL INJECTION 0.25 MG/5ML
0.2500 mg | Freq: Once | INTRAVENOUS | Status: AC
  Administered 2021-02-10: 0.25 mg via INTRAVENOUS

## 2021-02-10 MED ORDER — FAMOTIDINE 20 MG IN NS 100 ML IVPB
20.0000 mg | Freq: Once | INTRAVENOUS | Status: AC
Start: 2021-02-10 — End: 2021-02-10
  Administered 2021-02-10: 20 mg via INTRAVENOUS

## 2021-02-10 MED ORDER — SODIUM CHLORIDE 0.9% FLUSH
10.0000 mL | INTRAVENOUS | Status: DC | PRN
  Administered 2021-02-10: 10 mL
  Filled 2021-02-10: qty 10

## 2021-02-10 MED ORDER — SODIUM CHLORIDE 0.9 % IV SOLN
Freq: Once | INTRAVENOUS | Status: AC
  Filled 2021-02-10: qty 250

## 2021-02-10 MED ORDER — SODIUM CHLORIDE 0.9 % IV SOLN
611.5000 mg | Freq: Once | INTRAVENOUS | Status: AC
  Administered 2021-02-10: 610 mg via INTRAVENOUS
  Filled 2021-02-10: qty 61

## 2021-02-10 MED ORDER — HEPARIN SOD (PORK) LOCK FLUSH 100 UNIT/ML IV SOLN
500.0000 [IU] | Freq: Once | INTRAVENOUS | Status: AC | PRN
Start: 2021-02-10 — End: 2021-02-10
  Administered 2021-02-10: 500 [IU]
  Filled 2021-02-10: qty 5

## 2021-02-10 MED ORDER — SODIUM CHLORIDE 0.9 % IV SOLN
80.0000 mg/m2 | Freq: Once | INTRAVENOUS | Status: AC
  Administered 2021-02-10: 204 mg via INTRAVENOUS
  Filled 2021-02-10: qty 34

## 2021-02-10 MED ORDER — PALONOSETRON HCL INJECTION 0.25 MG/5ML
INTRAVENOUS | Status: AC
  Filled 2021-02-10: qty 5

## 2021-02-10 MED ORDER — SODIUM CHLORIDE 0.9 % IV SOLN
10.0000 mg | Freq: Once | INTRAVENOUS | Status: AC
  Administered 2021-02-10: 10 mg via INTRAVENOUS
  Filled 2021-02-10: qty 10

## 2021-02-10 MED ORDER — SODIUM CHLORIDE 0.9 % IV SOLN
150.0000 mg | Freq: Once | INTRAVENOUS | Status: AC
  Administered 2021-02-10: 150 mg via INTRAVENOUS
  Filled 2021-02-10: qty 150

## 2021-02-10 NOTE — Progress Notes (Signed)
Chest xray on 02/08/21 shows port a cath in the azygos vein.  Per policy, port a cath needing to be in SVC.  MD notified and orders received for STAT chest xray to evaluate placement.

## 2021-02-10 NOTE — Patient Instructions (Signed)
[facility] MEDICAL [facility]COLOGY   Discharge Instructions: Thank you for choosing [facility] to provide your oncology and hematology care.   If you have a lab appointment with the Cancer Center, please go directly to the Cancer Center and check in at the registration area.   Wear comfortable clothing and clothing appropriate for easy access to any Portacath or PICC line.   We strive to give you quality time with your provider. You may need to reschedule your appointment if you arrive late (15 or more minutes).  Arriving late affects you and other patients whose appointments are after yours.  Also, if you miss three or more appointments without notifying the office, you may be dismissed from the clinic at the provider's discretion.      For prescription refill requests, have your pharmacy contact our office and allow 72 hours for refills to be completed.    Today you received the following chemotherapy and/or immunotherapy agents keytruda, paclitaxel, carboplatin      To help prevent nausea and vomiting after your treatment, we encourage you to take your nausea medication as directed.  BELOW ARE SYMPTOMS THAT SHOULD BE REPORTED IMMEDIATELY: *FEVER GREATER THAN 100.4 F (38 C) OR HIGHER *CHILLS OR SWEATING *NAUSEA AND VOMITING THAT IS NOT CONTROLLED WITH YOUR NAUSEA MEDICATION *UNUSUAL SHORTNESS OF BREATH *UNUSUAL BRUISING OR BLEEDING *URINARY PROBLEMS (pain or burning when urinating, or frequent urination) *BOWEL PROBLEMS (unusual diarrhea, constipation, pain near the anus) TENDERNESS IN MOUTH AND THROAT WITH OR WITHOUT PRESENCE OF ULCERS (sore throat, sores in mouth, or a toothache) UNUSUAL RASH, SWELLING OR PAIN  UNUSUAL VAGINAL DISCHARGE OR ITCHING   Items with * indicate a potential emergency and should be followed up as soon as possible or go to the Emergency Department if any problems should occur.  Please show the CHEMOTHERAPY ALERT CARD or IMMUNOTHERAPY  ALERT CARD at check-in to the Emergency Department and triage nurse.  Should you have questions after your visit or need to cancel or reschedule your appointment, please contact [facility]  Dept: [phone]  and follow the prompts.  Office hours are 8:00 a.m. to 4:30 p.m. Monday - Friday. Please note that voicemails left after 4:00 p.m. may not be returned until the following business day.  We are closed weekends and major holidays. You have access to a nurse at all times for urgent questions. Please call the main number to the clinic Dept: [phone] and follow the prompts.   For any non-urgent questions, you may also contact your provider using MyChart. We now offer e-Visits for anyone 18 and older to request care online for non-urgent symptoms. For details visit [email or web address].   Also download the MyChart app! Go to the app store, search "MyChart", open the app, select [facility], and log in with your MyChart username and password.  Due to Covid, a mask is required upon entering the hospital/clinic. If you do not have a mask, one will be given to you upon arrival. For doctor visits, patients may have 1 support person aged 18 or older with them. For treatment visits, patients cannot have anyone with them due to current Covid guidelines and our immunocompromised population.   

## 2021-02-11 ENCOUNTER — Ambulatory Visit: Payer: 59

## 2021-02-13 ENCOUNTER — Ambulatory Visit: Payer: 59

## 2021-02-14 ENCOUNTER — Ambulatory Visit: Payer: 59

## 2021-02-14 ENCOUNTER — Ambulatory Visit: Payer: Self-pay | Admitting: Licensed Clinical Social Worker

## 2021-02-14 ENCOUNTER — Telehealth: Payer: Self-pay | Admitting: Licensed Clinical Social Worker

## 2021-02-14 ENCOUNTER — Telehealth: Payer: Self-pay | Admitting: Physician Assistant

## 2021-02-14 ENCOUNTER — Encounter: Payer: Self-pay | Admitting: Licensed Clinical Social Worker

## 2021-02-14 DIAGNOSIS — Z800 Family history of malignant neoplasm of digestive organs: Secondary | ICD-10-CM

## 2021-02-14 DIAGNOSIS — Z801 Family history of malignant neoplasm of trachea, bronchus and lung: Secondary | ICD-10-CM

## 2021-02-14 DIAGNOSIS — Z1379 Encounter for other screening for genetic and chromosomal anomalies: Secondary | ICD-10-CM

## 2021-02-14 DIAGNOSIS — Z170 Estrogen receptor positive status [ER+]: Secondary | ICD-10-CM

## 2021-02-14 DIAGNOSIS — Z171 Estrogen receptor negative status [ER-]: Secondary | ICD-10-CM

## 2021-02-14 DIAGNOSIS — C50511 Malignant neoplasm of lower-outer quadrant of right female breast: Secondary | ICD-10-CM

## 2021-02-14 DIAGNOSIS — Z8042 Family history of malignant neoplasm of prostate: Secondary | ICD-10-CM

## 2021-02-14 DIAGNOSIS — C50812 Malignant neoplasm of overlapping sites of left female breast: Secondary | ICD-10-CM

## 2021-02-14 DIAGNOSIS — Z803 Family history of malignant neoplasm of breast: Secondary | ICD-10-CM

## 2021-02-14 DIAGNOSIS — Z808 Family history of malignant neoplasm of other organs or systems: Secondary | ICD-10-CM

## 2021-02-14 NOTE — Telephone Encounter (Signed)
Sch per 5/3 los, pt aware

## 2021-02-14 NOTE — Progress Notes (Signed)
HPI:  Ms. Collings was previously seen in the [facility] due to a personal and family history of cancer and concerns regarding a hereditary predisposition to cancer. Please refer to our prior cancer genetics clinic note for more information regarding our discussion, assessment and recommendations, at the time. Ms. Bohl recent genetic test results were disclosed to her, as were recommendations warranted by these results. These results and recommendations are discussed in more detail below.  CANCER HISTORY:  Oncology History  Malignant neoplasm of lower-outer quadrant of right breast of female, estrogen receptor positive ([place])  01/20/2021 Cancer Staging   Staging form: Breast, AJCC 8th Edition - Clinical stage from 01/20/2021: Stage IA (cT1c, cN0, cM0, G2, ER+, PR-, HER2-) - Signed by Chauncey Cruel, MD on 01/25/2021 Stage prefix: Initial diagnosis Histologic grading system: 3 grade system   01/23/2021 Initial Diagnosis   Malignant neoplasm of lower-outer quadrant of right breast of female, estrogen receptor positive ([place])   02/10/2021 -  Chemotherapy    Patient is on Treatment Plan: BREAST PEMBROLIZUMAB + CARBOPLATIN D1 + PACLITAXEL D1,8,15 Q21D X 4 CYCLES / PEMBROLIZUMAB + AC Q21D X 4 CYCLES       Genetic Testing   Negative genetic testing. No pathogenic variants identified on the [facility] Multi-Cancer Panel+RNA. VUS in ATM called c.1132A>G identified. The report date is 02/13/2021.  The Multi-Cancer Panel + RNA offered by [facility] includes sequencing and/or deletion duplication testing of the following 84 genes: AIP, ALK, APC, ATM, AXIN2,BAP1,  BARD1, BLM, BMPR1A, BRCA1, BRCA2, BRIP1, CASR, CDC73, CDH1, CDK4, CDKN1B, CDKN1C, CDKN2A (p14ARF), CDKN2A (p16INK4a), CEBPA, CHEK2, CTNNA1, DICER1, DIS3L2, EGFR (c.2369C>T, p.Thr790Met variant only), EPCAM (Deletion/duplication testing only), FH, FLCN, GATA2, GPC3, GREM1 (Promoter region deletion/duplication testing only), HOXB13  (c.251G>A, p.Gly84Glu), HRAS, KIT, MAX, MEN1, MET, MITF (c.952G>A, p.Glu318Lys variant only), MLH1, MSH2, MSH3, MSH6, MUTYH, NBN, NF1, NF2, NTHL1, PALB2, PDGFRA, PHOX2B, PMS2, POLD1, POLE, POT1, PRKAR1A, PTCH1, PTEN, RAD50, RAD51C, RAD51D, RB1, RECQL4, RET, RUNX1, SDHAF2, SDHA (sequence changes only), SDHB, SDHC, SDHD, SMAD4, SMARCA4, SMARCB1, SMARCE1, STK11, SUFU, TERC, TERT, TMEM127, TP53, TSC1, TSC2, VHL, WRN and WT1.   Malignant neoplasm of overlapping sites of left breast in female, estrogen receptor negative ([place])  01/20/2021 Cancer Staging   Staging form: Breast, AJCC 8th Edition - Clinical stage from 01/20/2021: Stage IIIB (cT3, cN0, cM0, G3, ER-, PR-, HER2-) - Signed by Chauncey Cruel, MD on 01/25/2021 Histologic grading system: 3 grade system   01/25/2021 Initial Diagnosis   Malignant neoplasm of overlapping sites of left breast in female, estrogen receptor negative ([place])   02/10/2021 -  Chemotherapy    Patient is on Treatment Plan: BREAST PEMBROLIZUMAB + CARBOPLATIN D1 + PACLITAXEL D1,8,15 Q21D X 4 CYCLES / PEMBROLIZUMAB + AC Q21D X 4 CYCLES       Genetic Testing   Negative genetic testing. No pathogenic variants identified on the [facility] Multi-Cancer Panel+RNA. VUS in ATM called c.1132A>G identified. The report date is 02/13/2021.  The Multi-Cancer Panel + RNA offered by [facility] includes sequencing and/or deletion duplication testing of the following 84 genes: AIP, ALK, APC, ATM, AXIN2,BAP1,  BARD1, BLM, BMPR1A, BRCA1, BRCA2, BRIP1, CASR, CDC73, CDH1, CDK4, CDKN1B, CDKN1C, CDKN2A (p14ARF), CDKN2A (p16INK4a), CEBPA, CHEK2, CTNNA1, DICER1, DIS3L2, EGFR (c.2369C>T, p.Thr790Met variant only), EPCAM (Deletion/duplication testing only), FH, FLCN, GATA2, GPC3, GREM1 (Promoter region deletion/duplication testing only), HOXB13 (c.251G>A, p.Gly84Glu), HRAS, KIT, MAX, MEN1, MET, MITF (c.952G>A, p.Glu318Lys variant only), MLH1, MSH2, MSH3, MSH6, MUTYH, NBN, NF1, NF2, NTHL1, PALB2, PDGFRA, PHOX2B,  PMS2, POLD1, POLE, POT1, PRKAR1A, PTCH1, PTEN, RAD50, RAD51C, RAD51D, RB1, RECQL4, RET, RUNX1, SDHAF2, SDHA (sequence changes only), SDHB, SDHC, SDHD, SMAD4, SMARCA4, SMARCB1, SMARCE1, STK11, SUFU, TERC, TERT, TMEM127, TP53, TSC1, TSC2, VHL, WRN and WT1.     FAMILY HISTORY:  We obtained a detailed, 4-generation family history.  Significant diagnoses are listed below: Family History  Problem Relation Age of Onset  . Diabetes Mother   . Thyroid disease Mother   . Liver disease Mother   . Obesity Mother   . Hypertension Father   . Hyperlipidemia Father   . Heart disease Father   . Stroke Father   . Obesity Father   . Colon polyps Father   . Prostate cancer Brother   . Brain cancer Maternal Grandfather   . Pancreatic cancer Paternal Grandmother   . Colon cancer Neg Hx        No one in family with colon cancer.    Ms. Lapiana has 3 brothers. One had prostate cancer at 8.  Ms. Rabalais mother died at 31. Patient had 4 maternal aunts, 4 uncles. One aunt had breast cancer at 60 and died at 88. No known cancers in maternal cousins. Maternal grandmother died at 47. Grandfather had brain cancer at 93 and died at 14.  Ms. Goggins father is living at 9, no history of cancer. Patient had 4 paternal uncles and 1 aunt. An uncle had pancreatic cancer at 49 and died at 31. Another uncle had lung cancer and history of smoking. Her aunt had colon cancer in her 48s. Paternal grandmother had pancreatic cancer as well and died at 32. Grandfather had lung cancer and history of smoking.   Ms. Pettie is unaware of previous family history of genetic testing for hereditary cancer risks. Patient's maternal ancestors are of American descent, and paternal ancestors are of American descent. There is no reported Ashkenazi Jewish ancestry. There is no known consanguinity.     GENETIC TEST RESULTS: Genetic testing reported out on 02/13/2021 through the [facility] Multi-Cancer Panel+RNA cancer panel found no  pathogenic mutations.   The Multi-Cancer Panel + RNA offered by [facility] includes sequencing and/or deletion duplication testing of the following 84 genes: AIP, ALK, APC, ATM, AXIN2,BAP1,  BARD1, BLM, BMPR1A, BRCA1, BRCA2, BRIP1, CASR, CDC73, CDH1, CDK4, CDKN1B, CDKN1C, CDKN2A (p14ARF), CDKN2A (p16INK4a), CEBPA, CHEK2, CTNNA1, DICER1, DIS3L2, EGFR (c.2369C>T, p.Thr790Met variant only), EPCAM (Deletion/duplication testing only), FH, FLCN, GATA2, GPC3, GREM1 (Promoter region deletion/duplication testing only), HOXB13 (c.251G>A, p.Gly84Glu), HRAS, KIT, MAX, MEN1, MET, MITF (c.952G>A, p.Glu318Lys variant only), MLH1, MSH2, MSH3, MSH6, MUTYH, NBN, NF1, NF2, NTHL1, PALB2, PDGFRA, PHOX2B, PMS2, POLD1, POLE, POT1, PRKAR1A, PTCH1, PTEN, RAD50, RAD51C, RAD51D, RB1, RECQL4, RET, RUNX1, SDHAF2, SDHA (sequence changes only), SDHB, SDHC, SDHD, SMAD4, SMARCA4, SMARCB1, SMARCE1, STK11, SUFU, TERC, TERT, TMEM127, TP53, TSC1, TSC2, VHL, WRN and WT1.   The test report has been scanned into EPIC and is located under the Molecular Pathology section of the Results Review tab.  A portion of the result report is included below for reference.     We discussed with Ms. Lippold that because current genetic testing is not perfect, it is possible there may be a gene mutation in one of these genes that current testing cannot detect, but that chance is small.  We also discussed, that there could be another gene that has not yet been discovered, or that we have not yet tested, that is responsible for the cancer diagnoses in the family. It is also possible there is  a hereditary cause for the cancer in the family that Ms. Cobb did not inherit and therefore was not identified in her testing.  Therefore, it is important to remain in touch with cancer genetics in the future so that we can continue to offer Ms. Carmack the most up to date genetic testing.   Genetic testing did identify a variant of uncertain significance (VUS) in the ATM gene  called c.1132A>G.  At this time, it is unknown if this variant is associated with increased cancer risk or if this is a normal finding, but most variants such as this get reclassified to being inconsequential. It should not be used to make medical management decisions. With time, we suspect the lab will determine the significance of this variant, if any. If we do learn more about it, we will try to contact Ms. Mcloughlin to discuss it further. However, it is important to stay in touch with [place] periodically and keep the address and phone number up to date.  ADDITIONAL GENETIC TESTING: We discussed with Ms. Haws that her genetic testing was fairly extensive.  If there are genes identified to increase cancer risk that can be analyzed in the future, we would be happy to discuss and coordinate this testing at that time.    CANCER SCREENING RECOMMENDATIONS: Ms. Jumper test result is considered negative (normal).  This means that we have not identified a hereditary cause for her  personal and family history of cancer at this time. Most cancers happen by chance and this negative test suggests that her cancer may fall into this category.    While reassuring, this does not definitively rule out a hereditary predisposition to cancer. It is still possible that there could be genetic mutations that are undetectable by current technology. There could be genetic mutations in genes that have not been tested or identified to increase cancer risk.  Therefore, it is recommended she continue to follow the cancer management and screening guidelines provided by her oncology and primary healthcare provider.   An individual's cancer risk and medical management are not determined by genetic test results alone. Overall cancer risk assessment incorporates additional factors, including personal medical history, family history, and any available genetic information that may result in a personalized plan for cancer prevention and  surveillance.  RECOMMENDATIONS FOR FAMILY MEMBERS:  Relatives in this family might be at some increased risk of developing cancer, over the general population risk, simply due to the family history of cancer.  We recommended female relatives in this family have a yearly mammogram beginning at age 65, or 85 years younger than the earliest onset of cancer, an annual clinical breast exam, and perform monthly breast self-exams. Female relatives in this family should also have a gynecological exam as recommended by their primary provider.  All family members should be referred for colonoscopy starting at age 101.    It is also possible there is a hereditary cause for the cancer in Ms. Holton family that she did not inherit and therefore was not identified in her.  Based on Ms. Rogala family history, we recommended paternal relatives have genetic counseling and testing. Ms. Shuford will let us know if we can be of any assistance in coordinating genetic counseling and/or testing for these family members.  FOLLOW-UP: Lastly, we discussed with Ms. Wilensky that cancer genetics is a rapidly advancing field and it is possible that new genetic tests will be appropriate for her and/or her family members in the future. We encouraged her  to remain in contact with cancer genetics on an annual basis so we can update her personal and family histories and let her know of advances in cancer genetics that may benefit this family.   Our contact number was provided. Ms. Clevinger questions were answered to her satisfaction, and she knows she is welcome to call us at anytime with additional questions or concerns.   Faith Rogue, MS, [facility] Genetic Counselor [email or web address] Phone: [phone]

## 2021-02-14 NOTE — Telephone Encounter (Signed)
Revealed negative genetic testing.  Revealed that a VUS in ATM was identified. This normal result is reassuring and indicates that it is unlikely [facility] cancer is due to a hereditary cause.  It is unlikely that there is an increased risk of another cancer due to a mutation in one of these genes.  However, genetic testing is not perfect, and cannot definitively rule out a hereditary cause.  It will be important for her to keep in contact with genetics to learn if any additional testing may be needed in the future.

## 2021-02-15 ENCOUNTER — Ambulatory Visit (HOSPITAL_COMMUNITY)
Admission: RE | Admit: 2021-02-15 | Discharge: 2021-02-15 | Disposition: A | Discharge status: Home / Self Care | Payer: 59 | Source: Ambulatory Visit | Attending: General Surgery | Admitting: General Surgery

## 2021-02-15 ENCOUNTER — Other Ambulatory Visit: Payer: Self-pay

## 2021-02-15 DIAGNOSIS — R921 Mammographic calcification found on diagnostic imaging of breast: Secondary | ICD-10-CM | POA: Diagnosis not present

## 2021-02-15 DIAGNOSIS — M47814 Spondylosis without myelopathy or radiculopathy, thoracic region: Secondary | ICD-10-CM | POA: Diagnosis not present

## 2021-02-15 DIAGNOSIS — N6320 Unspecified lump in the left breast, unspecified quadrant: Secondary | ICD-10-CM | POA: Diagnosis not present

## 2021-02-15 DIAGNOSIS — Z95828 Presence of other vascular implants and grafts: Secondary | ICD-10-CM | POA: Diagnosis not present

## 2021-02-15 DIAGNOSIS — J8410 Pulmonary fibrosis, unspecified: Secondary | ICD-10-CM | POA: Diagnosis not present

## 2021-02-17 ENCOUNTER — Other Ambulatory Visit: Payer: Self-pay

## 2021-02-17 ENCOUNTER — Encounter: Payer: Self-pay | Admitting: *Deleted

## 2021-02-17 ENCOUNTER — Inpatient Hospital Stay: Payer: 59 | Admitting: Physician Assistant

## 2021-02-17 ENCOUNTER — Inpatient Hospital Stay: Payer: 59

## 2021-02-17 VITALS — BP 128/72 | HR 74 | Temp 98.5°F | Resp 18 | Ht 67.0 in | Wt 304.2 lb

## 2021-02-17 DIAGNOSIS — Z298 Encounter for other specified prophylactic measures: Secondary | ICD-10-CM

## 2021-02-17 DIAGNOSIS — C50812 Malignant neoplasm of overlapping sites of left female breast: Secondary | ICD-10-CM

## 2021-02-17 DIAGNOSIS — C50511 Malignant neoplasm of lower-outer quadrant of right female breast: Secondary | ICD-10-CM

## 2021-02-17 DIAGNOSIS — Z171 Estrogen receptor negative status [ER-]: Secondary | ICD-10-CM | POA: Diagnosis not present

## 2021-02-17 DIAGNOSIS — Z79899 Other long term (current) drug therapy: Secondary | ICD-10-CM | POA: Diagnosis not present

## 2021-02-17 DIAGNOSIS — Z87891 Personal history of nicotine dependence: Secondary | ICD-10-CM | POA: Diagnosis not present

## 2021-02-17 DIAGNOSIS — Z170 Estrogen receptor positive status [ER+]: Secondary | ICD-10-CM

## 2021-02-17 DIAGNOSIS — Z5112 Encounter for antineoplastic immunotherapy: Secondary | ICD-10-CM | POA: Diagnosis not present

## 2021-02-17 DIAGNOSIS — Z5111 Encounter for antineoplastic chemotherapy: Secondary | ICD-10-CM | POA: Diagnosis not present

## 2021-02-17 LAB — CBC WITH DIFFERENTIAL/PLATELET
Abs Immature Granulocytes: 0.02 10*3/uL (ref 0.00–0.07)
Basophils Absolute: 0 10*3/uL (ref 0.0–0.1)
Basophils Relative: 0 %
Eosinophils Absolute: 0.4 10*3/uL (ref 0.0–0.5)
Eosinophils Relative: 12 %
HCT: 35.3 % — ABNORMAL LOW (ref 36.0–46.0)
Hemoglobin: 11.6 g/dL — ABNORMAL LOW (ref 12.0–15.0)
Immature Granulocytes: 1 %
Lymphocytes Relative: 28 %
Lymphs Abs: 1 10*3/uL (ref 0.7–4.0)
MCH: 28 pg (ref 26.0–34.0)
MCHC: 32.9 g/dL (ref 30.0–36.0)
MCV: 85.3 fL (ref 80.0–100.0)
Monocytes Absolute: 0.3 10*3/uL (ref 0.1–1.0)
Monocytes Relative: 8 %
Neutro Abs: 1.8 10*3/uL (ref 1.7–7.7)
Neutrophils Relative %: 51 %
Platelets: 188 10*3/uL (ref 150–400)
RBC: 4.14 MIL/uL (ref 3.87–5.11)
RDW: 15.1 % (ref 11.5–15.5)
WBC: 3.4 10*3/uL — ABNORMAL LOW (ref 4.0–10.5)
nRBC: 0 % (ref 0.0–0.2)

## 2021-02-17 LAB — COMPREHENSIVE METABOLIC PANEL
ALT: 19 U/L (ref 0–44)
AST: 17 U/L (ref 15–41)
Albumin: 3.1 g/dL — ABNORMAL LOW (ref 3.5–5.0)
Alkaline Phosphatase: 69 U/L (ref 38–126)
Anion gap: 9 (ref 5–15)
BUN: 13 mg/dL (ref 6–20)
CO2: 26 mmol/L (ref 22–32)
Calcium: 9 mg/dL (ref 8.9–10.3)
Chloride: 104 mmol/L (ref 98–111)
Creatinine, Ser: 1.17 mg/dL — ABNORMAL HIGH (ref 0.44–1.00)
GFR, Estimated: 55 mL/min — ABNORMAL LOW (ref >60)
Glucose, Bld: 116 mg/dL — ABNORMAL HIGH (ref 70–99)
Potassium: 4 mmol/L (ref 3.5–5.1)
Sodium: 139 mmol/L (ref 135–145)
Total Bilirubin: 0.4 mg/dL (ref 0.3–1.2)
Total Protein: 6.6 g/dL (ref 6.5–8.1)

## 2021-02-17 LAB — TSH: TSH: 2.733 u[IU]/mL (ref 0.308–3.960)

## 2021-02-17 MED ORDER — FAMOTIDINE 20 MG IN NS 100 ML IVPB
INTRAVENOUS | Status: AC
  Filled 2021-02-17: qty 100

## 2021-02-17 MED ORDER — SODIUM CHLORIDE 0.9% FLUSH
10.0000 mL | INTRAVENOUS | Status: DC | PRN
  Filled 2021-02-17: qty 10

## 2021-02-17 MED ORDER — DIPHENHYDRAMINE HCL 50 MG/ML IJ SOLN
INTRAMUSCULAR | Status: AC
  Filled 2021-02-17: qty 1

## 2021-02-17 MED ORDER — FAMOTIDINE 20 MG IN NS 100 ML IVPB
20.0000 mg | Freq: Once | INTRAVENOUS | Status: AC
  Administered 2021-02-17: 20 mg via INTRAVENOUS

## 2021-02-17 MED ORDER — HEPARIN SOD (PORK) LOCK FLUSH 100 UNIT/ML IV SOLN
500.0000 [IU] | Freq: Once | INTRAVENOUS | Status: DC | PRN
  Filled 2021-02-17: qty 5

## 2021-02-17 MED ORDER — SODIUM CHLORIDE 0.9 % IV SOLN
Freq: Once | INTRAVENOUS | Status: AC
  Filled 2021-02-17: qty 250

## 2021-02-17 MED ORDER — DIPHENHYDRAMINE HCL 50 MG/ML IJ SOLN
25.0000 mg | Freq: Once | INTRAMUSCULAR | Status: AC
  Administered 2021-02-17: 25 mg via INTRAVENOUS

## 2021-02-17 MED ORDER — SODIUM CHLORIDE 0.9 % IV SOLN
80.0000 mg/m2 | Freq: Once | INTRAVENOUS | Status: AC
  Administered 2021-02-17: 204 mg via INTRAVENOUS
  Filled 2021-02-17: qty 34

## 2021-02-17 MED ORDER — SODIUM CHLORIDE 0.9 % IV SOLN
10.0000 mg | Freq: Once | INTRAVENOUS | Status: AC
  Administered 2021-02-17: 10 mg via INTRAVENOUS
  Filled 2021-02-17: qty 10

## 2021-02-17 NOTE — Progress Notes (Addendum)
[place]  [phone] [phone]) [phone]     ID: NICHOLLE FALZON DOB: 1966-05-14  MR#: [id number]  MGQ#:[id number]  Patient Care Team: Fanny Bien, MD as PCP - General (Family Medicine) Herminio Commons, MD (Inactive) as PCP - Cardiology (Cardiology) Mauro Kaufmann, RN as Oncology Nurse Navigator Rockwell Germany, RN as Oncology Nurse Navigator Stark Klein, MD as Consulting Physician (General Surgery) Kyung Rudd, MD as Consulting Physician (Radiation Oncology) Lincoln Brigham, PA-C OTHER MD:  CHIEF COMPLAINT: Bilateral breast cancers Cancer Staging Malignant neoplasm of lower-outer quadrant of right breast of female, estrogen receptor positive ([place]) Staging form: Breast, AJCC 8th Edition - Clinical stage from 01/20/2021: Stage IA (cT1c, cN0, cM0, G2, ER+, PR-, HER2-) - Signed by Chauncey Cruel, MD on 01/25/2021  Malignant neoplasm of overlapping sites of left breast in female, estrogen receptor negative ([place]) Staging form: Breast, AJCC 8th Edition - Clinical stage from 01/20/2021: Stage IIIB (cT3, cN0, cM0, G3, ER-, PR-, HER2-) - Signed by Chauncey Cruel, MD on 01/25/2021   CURRENT TREATMENT: Neoadjuvant chemotherapy with Pembrolizumab + Carboplatin D1 plus Paclitaxel D1,8, 15 q 21 days x 4 cycles/ Pembrolizumab + AC q 21 days x 4 cycles  -02/10/2021: C1D1: Pembrolizumab, Carboplatin and Paclitaxel -02/17/2021: C1D8: Paclitaxel   ONCOLOGIC HISTORY: 1) Initially presented with palpable left breast lump.  2) 01/12/2021: She underwent bilateral diagnostic mammography with tomography and bilateral breast ultrasonography at [facility]. Results revealed breast density category C; 5.8 cm mass in the left breast at 12:30 with associated calcifications; additional 1.2 cm group of calcifications in the outer left breast; On the right: Multiple small masses and irregular ducts in the retroareolar right breast, including a 0.8 cm  representative mass at 6 o'clock; indeterminate 0.4 cm mass in upper-inner right breast; normal lymph nodes bilaterally.  3) 01/16/2021 she proceeded to biopsy of the right breast areas in question. The pathology from this procedure ([id number]) showed:  1. Right Breast, 6 o'clock- invasive ductal carcinoma, grade 2. Prognostic indicators significant for: estrogen receptor, 90% positive with strong staining intensity and progesterone receptor, 0% negative. Proliferation marker Ki67 at 5%. HER2 equivocal by immunohistochemistry (2+), but negative by fluorescent in situ hybridization with a signals ratio 1.61 and number per cell 2.5. 2. Right Breast, 9 o'clock- fibrocystic change with adenosis, calcifications, and a small intraductal papilloma.  4) 01/20/2021: Underwent biopsy of the left breast areas. Pathology [id number]) revealed: 1. Left Breast, upper-outer quadrant- invasive ductal carcinoma, grade 3 2. Left Breast, central, slightly lateral to midline- ductal carcinoma in situ with necrosis and calcifications, intermediate grade Prognostic panels are pending on both samples.  5) 02/01/2021: Underwent breast MRI on showing: breast composition C; 5.8 cm spiculated mass in upper-outer left breast, correlating to biopsy-proven malignancy; 2.1 cm spiculated enhancing mass in right breast at 6 o'clock, consistent with biopsy-proven malignancy; extensive patchy nodular enhancement throughout lower portions of bilateral breast, additional site of cancer is not excluded; a right axillary lymph node with focal eccentric cortex enhancement.  6) 02/06/2021: Underwent staging chest CT and bone scan that showed no acute cardiopulmonary abnormalities; no findings to suggest metastatic disease.Bone scan was also negative for metastatic disease.  7) 02/07/2021: Underwent echocardiogram showed an ejection fraction of 60-65%.She also underwent second-look right breast ultrasound on 02/07/2021 to further evaluate the lymph  node seen on MRI. This showed: borderline focal cortical thickening of a deep right axillary lymph node measuring 3-4 mm, a safe window for targeted biopsy  could not be reproduced.  She also underwent additional right breast biopsy on 02/07/2021. Pathology revealed complex sclerosing lesion.   8) 02/08/2021: Underwent port placement  9) 02/10/2021: Started neoadjuvant treatment with pembrolizumab, carboplatin and paclitaxel.   10) 02/13/2021: Negative genetic testing. No pathogenic variants identified on the [employer] Multi-Cancer Panel+RNA. VUS in ATM called [id number] identified.   INTERIM HISTORY:  BRENEE GAJDA is 55 y.o. female presenting to the clinic prior to C1D8 with Paclitaxel. Patient is unaccompanied for this visit. She tolerated the first treatment well. She experienced fatigue for 2-3 days after chemotherapy but continued to complete her ADLs on her own. She noted decreased appetite but started to eat small, frequent meals. Patient experienced mild nausea without any vomiting episodes. Patient denies any abdominal pain. She noticed loose stools without any episodes of diarrhea. Patient denies any easy bruising or signs of bleeding. She experienced throbbing sensation over the port site yesterday but denies any symptoms today. Patient denies any fevers, chills, night sweats, shortness of breath, chest pain or cough. She has no other complaints. Rest of the 10 point ROS is below.   REVIEW OF SYSTEMS:   Constitutional: ( - ) fevers, ( - )  chills , ( - ) night sweats Eyes: ( - ) blurriness of vision, ( - ) double vision, ( - ) watery eyes Ears, nose, mouth, throat, and face: ( - ) mucositis, ( - ) sore throat Respiratory: ( - ) cough, ( - ) dyspnea, ( - ) wheezes Cardiovascular: ( - ) palpitation, ( - ) chest discomfort, ( - ) lower extremity swelling Gastrointestinal:  ( + ) nausea, ( - ) heartburn, ( + ) change in bowel habits Skin: ( - ) abnormal skin rashes Lymphatics: ( - ) new  lymphadenopathy, ( - ) easy bruising Neurological: ( - ) numbness, ( - ) tingling, ( - ) new weaknesses Behavioral/Psych: ( - ) mood change, ( - ) new changes  All other systems were reviewed with the patient and are negative    PAST MEDICAL HISTORY: Past Medical History:  Diagnosis Date  . Acid reflux   . Anemia   . Arthritis    "maybe in my fingers" (03/26/2018)  . Breast cancer ([place]) 12/2020  . Family history of adverse reaction to anesthesia    "brother, Otila Kluver, and my mom always get sick" (03/26/2018)  . Family history of brain cancer   . Family history of breast cancer   . Family history of colon cancer   . Family history of lung cancer   . Family history of pancreatic cancer   . Family history of prostate cancer   . Gallbladder problem   . Headache   . High blood pressure   . High cholesterol   . Hip pain    "left; before OR" (03/26/2018)  . History of kidney stones   . OSA (obstructive sleep apnea)    "can't tolerate the mask" (03/26/2018)  . PONV (postoperative nausea and vomiting)   . Pre-diabetes   . Shortness of breath   . Swelling of lower extremity    "that's why they put me on fluid pill" (03/26/2018)    PAST SURGICAL HISTORY: Past Surgical History:  Procedure Laterality Date  . CARPAL TUNNEL RELEASE Right ~ 2006  . EYE SURGERY    . JOINT REPLACEMENT    . LAPAROSCOPIC CHOLECYSTECTOMY  12/2010  . LASIK Bilateral 2000  . PORTACATH PLACEMENT N/A 02/08/2021   Procedure: INSERTION PORT-A-CATH;  Surgeon: Stark Klein, MD;  Location: [facility];  Service: General;  Laterality: N/A;  . TOTAL HIP ARTHROPLASTY Left 03/25/2018   Procedure: LEFT TOTAL HIP ARTHROPLASTY ANTERIOR APPROACH;  Surgeon: Mcarthur Rossetti, MD;  Location: [facility];  Service: Orthopedics;  Laterality: Left;    FAMILY HISTORY: Family History  Problem Relation Age of Onset  . Diabetes Mother   . Thyroid disease Mother   . Liver disease Mother   . Obesity Mother   . Hypertension Father   .  Hyperlipidemia Father   . Heart disease Father   . Stroke Father   . Obesity Father   . Colon polyps Father   . Prostate cancer Brother   . Brain cancer Maternal Grandfather   . Pancreatic cancer Paternal Grandmother   . Colon cancer Neg Hx        No one in family with colon cancer.  The patient's father is 33 years old as of Feb 16, 2021.  The patient's mother died from cirrhosis associated with hepatitis C (from a transfusion) age 42.  The patient has 3 brothers, no sisters.  1 brother has a history of prostate cancer at age 20.  A maternal grandfather had a history of central nervous system carcinoma.  A paternal grandmother had a history of pancreatic cancer and a maternal aunt had a history of breast cancer in her late 4s.   GYNECOLOGIC HISTORY:  No LMP recorded (lmp unknown). Patient is postmenopausal. Menarche: 55 years old [place] P 0 LMP 53 HRT no  Hysterectomy? no BSO? no   SOCIAL HISTORY: (updated 02-16-2021)  Rosaria Ferries "Zigmund Daniel" works for [employer] in [employer].  She works from home.  She is a Engineer, mining.  She describes herself as single, lives by herself, with a toy poodle and a [place] as well as 2 cats.  ADVANCED DIRECTIVES: The patient's brother Loralie Malta is her healthcare power of attorney.  He can be reached at [phone]   HEALTH MAINTENANCE: Social History   Tobacco Use  . Smoking status: Former Research scientist (life sciences)  . Smokeless tobacco: Former Systems developer    Types: Chew  . Tobacco comment: In teens  Vaping Use  . Vaping Use: Never used  Substance Use Topics  . Alcohol use: Yes    Comment: Occasional  . Drug use: Never     Colonoscopy:   PAP: 03/2011  Bone density: scheduled for 01/2021   No Known Allergies  Current Outpatient Medications  Medication Sig Dispense Refill  . acetaminophen (TYLENOL) 500 MG tablet Take 1,000 mg by mouth every 6 (six) hours as needed for moderate pain.    Marland Kitchen aspirin EC 81 MG tablet Take 81 mg by mouth daily.    . Blood Glucose  Monitoring Suppl (FREESTYLE LITE) w/Device KIT CHECK GLUCOSE 2-3 TIMES DAILY 1 kit 0  . Cholecalciferol (VITAMIN D) 50 MCG (2000 UT) CAPS Take 2,000 Units by mouth daily.    . ciprofloxacin (CIPRO) 500 MG tablet Take 1 tablet by mouth twice a day for 3 days for bladder infection 6 tablet 0  . dexamethasone (DECADRON) 4 MG tablet Take 2 tablets once a day for 3 days after carboplatin and AC chemotherapy. Take with food. 30 tablet 1  . ELDERBERRY PO Take 1 capsule by mouth daily.    . folic acid (FOLVITE) 1 MG tablet Take 1 mg by mouth daily.    Marland Kitchen glucose blood test strip USE AS DIRECTED TO CHECK BLOOD SUGAR 1 TO 3 TIMES A DAY (Patient  taking differently: USE AS DIRECTED TO CHECK BLOOD SUGAR 1 TO 3 TIMES A DAY) 100 strip 3  . ketoconazole (NIZORAL) 2 % cream Apply 1 application topically daily. 15 g 0  . Lancets (FREESTYLE) lancets USE AS DIRECTED TO CHECK BLOOD SUGAR 2 TO 3 TIMES A DAY (Patient taking differently: USE AS DIRECTED TO CHECK BLOOD SUGAR 2 TO 3 TIMES A DAY) 100 each 3  . lidocaine-prilocaine (EMLA) cream Apply to affected area once 30 g 3  . losartan (COZAAR) 100 MG tablet Take 1 tablet by mouth daily 90 tablet 3  . losartan (COZAAR) 50 MG tablet TAKE 1 TABLET BY MOUTH ONCE A DAY FOR HIGH BLOOD PRESSURE (Patient taking differently: Take 100 mg by mouth daily.) 90 tablet 0  . methotrexate (RHEUMATREX) 15 MG tablet Take 15 mg by mouth every Wednesday. Caution: Chemotherapy. Protect from light.    . Multiple Vitamin (MULTIVITAMIN WITH MINERALS) TABS tablet Take 1 tablet by mouth daily.    . ondansetron (ZOFRAN) 8 MG tablet Take 1 tablet (8 mg total) by mouth 2 (two) times daily as needed. Start on the third day after carboplatin and AC chemotherapy. 30 tablet 1  . oxyCODONE (OXY IR/ROXICODONE) 5 MG immediate release tablet Take 1 tablet (5 mg total) by mouth every 6 (six) hours as needed for severe pain. 8 tablet 0  . prochlorperazine (COMPAZINE) 10 MG tablet Take 1 tablet (10 mg total) by  mouth every 6 (six) hours as needed (Nausea or vomiting). 30 tablet 1  . rosuvastatin (CRESTOR) 10 MG tablet TAKE 1 TABLET BY MOUTH ONCE A DAY AT BEDTIME FOR CHOLESTEROL (Patient taking differently: Take 10 mg by mouth at bedtime.) 90 tablet 1   No current facility-administered medications for this visit.   Facility-Administered Medications Ordered in Other Visits  Medication Dose Route Frequency Provider Last Rate Last Admin  . heparin lock flush 100 unit/mL  500 Units Intracatheter Once PRN Magrinat, Virgie Dad, MD      . PACLitaxel (TAXOL) 204 mg in sodium chloride 0.9 % 250 mL chemo infusion (</= 33m/m2)  80 mg/m2 (Treatment Plan Recorded) Intravenous Once MChauncey Cruel MD 284 mL/hr at 02/17/21 1019 204 mg at 02/17/21 1019  . sodium chloride flush (NS) 0.9 % injection 10 mL  10 mL Intracatheter PRN Magrinat, GVirgie Dad MD        OBJECTIVE:   Vitals:   02/17/21 0841  BP: 128/72  Pulse: 74  Resp: 18  Temp: 98.5 F (36.9 C)  SpO2: 97%     Body mass index is 47.64 kg/m.   Wt Readings from Last 3 Encounters:  02/17/21 (!) 304 lb 3.2 oz (138 kg)  02/09/21 (!) 311 lb 9.6 oz (141.3 kg)  02/07/21 (!) 305 lb 9.6 oz (138.6 kg)   ECOG FS:1 - Symptomatic but completely ambulatory GENERAL: well appearing female in NAD  SKIN: skin color, texture, turgor are normal, no rashes or significant lesions EYES: conjunctiva are pink and non-injected, sclera clear OROPHARYNX: no exudate, no erythema; lips, buccal mucosa, and tongue normal  NECK: supple, non-tender  LUNGS: clear to auscultation and percussion with normal breathing effort HEART: regular rate & rhythm and no murmurs and no lower extremity edema ABDOMEN: soft, non-tender, non-distended, normal bowel sounds Musculoskeletal: no cyanosis of digits and no clubbing  PSYCH: alert & oriented x 3, fluent speech NEURO: no focal motor/sensory deficits   LAB RESULTS:  CMP     Component Value Date/Time   NA 139 02/17/2021 0815  NA  143 12/02/2018 1138   K 4.0 02/17/2021 0815   CL 104 02/17/2021 0815   CO2 26 02/17/2021 0815   GLUCOSE 116 (H) 02/17/2021 0815   BUN 13 02/17/2021 0815   BUN 17 12/02/2018 1138   CREATININE 1.17 (H) 02/17/2021 0815   CREATININE 1.32 (H) 01/24/2021 1611   CREATININE 1.09 (H) 02/18/2019 1524   CALCIUM 9.0 02/17/2021 0815   PROT 6.6 02/17/2021 0815   PROT 6.9 12/02/2018 1138   ALBUMIN 3.1 (L) 02/17/2021 0815   ALBUMIN 4.0 12/02/2018 1138   AST 17 02/17/2021 0815   AST 14 (L) 01/24/2021 1611   ALT 19 02/17/2021 0815   ALT 17 01/24/2021 1611   ALKPHOS 69 02/17/2021 0815   BILITOT 0.4 02/17/2021 0815   BILITOT 0.2 (L) 01/24/2021 1611   GFRNONAA 55 (L) 02/17/2021 0815   GFRNONAA 47 (L) 01/24/2021 1611   GFRAA 70 12/02/2018 1138    No results found for: TOTALPROTELP, ALBUMINELP, A1GS, A2GS, BETS, BETA2SER, GAMS, MSPIKE, SPEI  Lab Results  Component Value Date   WBC 3.4 (L) 02/17/2021   NEUTROABS 1.8 02/17/2021   HGB 11.6 (L) 02/17/2021   HCT 35.3 (L) 02/17/2021   MCV 85.3 02/17/2021   PLT 188 02/17/2021    No results found for: LABCA2  No components found for: [id number]  No results for input(s): INR in the last 168 hours.  No results found for: LABCA2  No results found for: BZJ696  No results found for: VEL381  No results found for: OFB510  No results found for: CA2729  No components found for: HGQUANT  No results found for: CEA1 / No results found for: CEA1   No results found for: AFPTUMOR  No results found for: CHROMOGRNA  No results found for: KPAFRELGTCHN, LAMBDASER, KAPLAMBRATIO (kappa/lambda light chains)  No results found for: HGBA, HGBA2QUANT, HGBFQUANT, HGBSQUAN (Hemoglobinopathy evaluation)   No results found for: LDH  No results found for: IRON, TIBC, IRONPCTSAT (Iron and TIBC)  No results found for: FERRITIN  Urinalysis    Component Value Date/Time   COLORURINE YELLOW 10/23/2010 2031   APPEARANCEUR CLEAR 10/23/2010 2031   LABSPEC  1.025 10/23/2010 2031   PHURINE 6.0 10/23/2010 2031   GLUCOSEU NEGATIVE 03/12/2009 0843   HGBUR MODERATE (A) 10/23/2010 2031   BILIRUBINUR NEGATIVE 10/23/2010 2031   KETONESUR NEGATIVE 10/23/2010 2031   PROTEINUR TRACE (A) 10/23/2010 2031   UROBILINOGEN 0.2 10/23/2010 2031   NITRITE NEGATIVE 10/23/2010 2031   LEUKOCYTESUR NEGATIVE 10/23/2010 2031    STUDIES: DG Chest 1 View  Result Date: 02/10/2021 CLINICAL DATA:  Port placement EXAM: CHEST  1 VIEW COMPARISON:  Feb 08, 2021 FINDINGS: The cardiomediastinal silhouette is unchanged in contour.LEFT chest port with tip curled over the mediastinum and likely within the azygos vein. Unchanged elevation of the RIGHT hemidiaphragm. No pleural effusion. No pneumothorax. No acute pleuroparenchymal abnormality. Visualized abdomen is unremarkable. No acute osseous abnormality. IMPRESSION: LEFT chest port tip remains curled over the region of the azygos vein. These results will be called to the ordering clinician or representative by the Radiologist Assistant, and communication documented in the PACS or [employer]. Electronically Signed   By: Valentino Saxon MD   On: 02/10/2021 11:06   CT CHEST WO CONTRAST  Result Date: 02/17/2021 CLINICAL DATA:  Left-sided chest port not working during last chemo treatment last Friday. EXAM: CT CHEST WITHOUT CONTRAST TECHNIQUE: Multidetector CT imaging of the chest was performed following the standard protocol without IV contrast. COMPARISON:  Chest CT 02/06/2021 FINDINGS: Cardiovascular: There is a left-sided chest port with catheter coursing down the left internal jugular vein, across the left brachiocephalic vein, into the superior vena cava, then courses down the azygos vein with tip near the level of the aortic hiatus. Normal cardiac size. No pericardial disease. Mediastinum/Nodes: No mediastinal, hilar, or axillary lymphadenopathy. The thyroid, trachea, and esophagus are unremarkable. Lungs/Pleura: No focal  airspace consolidation. No suspicious pulmonary nodules or masses. There is a calcified granuloma in the superior segment of the right lower lobe, unchanged. No pleural effusion or pneumothorax. Upper Abdomen: Hepatic steatosis. Prior cholecystectomy. Small splenule. Likely small hiatal hernia. Musculoskeletal: Thoracic spondylosis. There is a lucent lesion within the right pedicle of T7, not significantly changed since prior CT in May 2020, and favored to be benign. This can be reassessed on follow-up exam. There are no suspicious osseous lesions. Unchanged left breast mass with calcification compatible with known breast cancer. IMPRESSION: Left-sided chest port catheter courses down the azygos vein with tip near the level of the aortic hiatus. No acute findings in the chest.  No suspicious pulmonary nodules. Unchanged left breast mass compatible with known history of breast carcinoma. These results will be called to the ordering clinician or representative by the Radiologist Assistant, and communication documented in the PACS or [employer]. Electronically Signed   By: Maurine Simmering   On: 02/17/2021 11:13   CT Chest W Contrast  Result Date: 02/06/2021 CLINICAL DATA:  Staging locally advanced breast cancer. EXAM: CT CHEST WITH CONTRAST TECHNIQUE: Multidetector CT imaging of the chest was performed during intravenous contrast administration. CONTRAST:  8m OMNIPAQUE IOHEXOL 300 MG/ML  SOLN COMPARISON:  None FINDINGS: Cardiovascular: No significant vascular findings. Normal heart size. No pericardial effusion. Mediastinum/Nodes: No enlarged mediastinal, hilar, or axillary lymph nodes. Thyroid gland, trachea, and esophagus demonstrate no significant findings. Lungs/Pleura: No pleural effusion. No airspace consolidation, atelectasis, or pneumothorax. Calcified granuloma identified in the superior segment of right lower lobe, image 44/10. No suspicious lung nodules. Upper Abdomen: No acute abnormality. Previous  cholecystectomy. Subjective diffuse hepatic steatosis. Musculoskeletal: Degenerative disc disease identified within the thoracic spine. No acute or suspicious osseous findings. Left breast mass is again noted measuring 3.9 x 3.9 by 4.3 cm. IMPRESSION: 1. No acute cardiopulmonary abnormalities. No findings to suggest metastatic disease to the chest. 2. Left breast mass compatible with known primary breast carcinoma. 3. Hepatic steatosis. Electronically Signed   By: TKerby MoorsM.D.   On: 02/06/2021 15:55   NM Bone Scan Whole Body  Result Date: 02/07/2021 CLINICAL DATA:  Bilateral breast cancer EXAM: NUCLEAR MEDICINE WHOLE BODY BONE SCAN TECHNIQUE: Whole body anterior and posterior images were obtained approximately 3 hours after intravenous injection of radiopharmaceutical. RADIOPHARMACEUTICALS:  20.5 mCi Technetium-941mDP IV COMPARISON:  02/06/2021 FINDINGS: Anterior and posterior whole body planar images are obtained. Physiologic excretion of radiotracer is seen within the kidneys and bladder. Degenerative type activity is seen within the bilateral shoulders, left wrist, bilateral knees, bilateral ankles. There is no abnormal radiotracer activity within the bony structures to suggest an acute or destructive process. Specifically, no evidence of bony metastatic disease. Incidental note is made of asymmetric radiotracer activity in the region of the upper outer left breast, likely uptake within the patient's known left breast cancer. IMPRESSION: 1. No evidence of bony metastatic disease. 2. Likely soft tissue uptake radiotracer in the left breast corresponding to the patient's known left breast cancer. 3. Degenerative activity as above. Electronically Signed   By: MiLegrand Como  Owens Shark M.D.   On: 02/07/2021 17:18   MR BREAST BILATERAL W WO CONTRAST INC CAD  Result Date: 02/02/2021 CLINICAL DATA:  Recent diagnosis of bilateral breast cancer, 2 site in the left breast, 1 site in the right breast. There is a second  site of biopsy in the right breast 9 o'clock with benign pathology and found be discordant for which excision is recommended. LABS:  Does not apply EXAM: BILATERAL BREAST MRI WITH AND WITHOUT CONTRAST TECHNIQUE: Multiplanar, multisequence MR images of both breasts were obtained prior to and following the intravenous administration of 10 ml of Gadavist Three-dimensional MR images were rendered by post-processing of the original MR data on an independent workstation. The three-dimensional MR images were interpreted, and findings are reported in the following complete MRI report for this study. Three dimensional images were evaluated at the independent interpreting workstation using the DynaCAD thin client. COMPARISON:  Prior films FINDINGS: Breast composition: c. Heterogeneous fibroglandular tissue. Background parenchymal enhancement: Moderate. Right breast: There is a 2.1 x 1.5 cm spiculated enhancing mass in the right breast 6 o'clock with associated biopsy clip consistent with recently biopsy proven right breast cancer (series 9, image 105.) There is biopsy clip artifact in the anterior lateral right breast, site of previous biopsy which show papilloma at 9 o'clock. There is extensive patchy nodular enhancement throughout the inferior portion of the right breast. Left breast: There is a 5.8 x 4.5 cm spiculated enhancing mass in the lateral upper left breast with associated biopsy clip consistent with recently biopsy proven left breast cancer. There is biopsy clip artifact in the lateral lower left breast correlating to recently biopsy proven left breast DCIS. There is surrounding extensive patchy nodular enhancement throughout the inferior portion of the left breast and adjacent to the biopsy clip in the lateral lower left breast which showed DCIS on pathology. Lymph nodes: In the right axilla, there is a right axillary lymph node with focal eccentric cortex enhancement, cortex measuring 1 cm. Ancillary findings:   None. IMPRESSION: 1. 5.8 cm spiculated mass in the upper-outer quadrant left breast correlating to recently biopsy proven left breast cancer. 2. 2.1 cm spiculated enhancing mass in the right breast 6 o'clock with associated biopsy clip consistent with recently proven right breast cancer. 3. There are extensive patchy nodular enhancement throughout the lower portions of bilateral breast. The left breast biopsy proven DCIS clip is located in one of the areas of this extensive patchy nodular enhancement. Given the MRI enhancement in the inferior portions of bilateral breast, additional site of cancer is not excluded in the inferior portions of bilateral breasts. If breast conservation is being consider for either breast, the patient will need MR guided biopsies of bilateral lower breasts, 2 sites in the each breast of the anterior and posterior extent. 4. Recommend stereotactic core biopsy of indeterminate small mass in the upper inner quadrant right breast seen and described on the diagnostic mammogram of January 12, 2021. 5. Recommend second-look ultrasound with possible biopsy of one abnormal eccentric thickened cortex right axillary lymph node. RECOMMENDATION: 1. recommend stereotactic core biopsy of indeterminate small mass in the upper inner quadrant right breast seen and described on the diagnostic mammogram of January 12, 2021. 2. Recommend second-look ultrasound with possible biopsy of one abnormal eccentric thickened cortex right axillary lymph node. 3. There are extensive patchy nodular enhancement throughout the lower portions of bilateral breast. The left breast biopsy proven DCIS clip is located in one of the areas of this extensive patchy  nodular enhancement. Given the MRI enhancement in the inferior portions of bilateral breast, additional site of cancer is not excluded in the inferior portions of bilateral breasts. If breast conservation is being consider for either breast, the patient will need MR guided  biopsies of bilateral lower breasts, 2 sites in the each breast of the anterior and posterior extent. BI-RADS CATEGORY  4: Suspicious. Electronically Signed   By: Abelardo Diesel M.D.   On: 02/02/2021 12:19   DG CHEST PORT 1 VIEW  Addendum Date: 02/08/2021   ADDENDUM REPORT: 02/08/2021 13:00 ADDENDUM: The left IJ power port courses down the left internal jugular vein, across the left brachiocephalic vein and into the SVC. The catheter then courses down into the azygos vein. Electronically Signed   By: Marijo Sanes M.D.   On: 02/08/2021 13:00   Result Date: 02/08/2021 CLINICAL DATA:  Porta catheter placement EXAM: PORTABLE CHEST 1 VIEW COMPARISON:  02/06/2021 FINDINGS: Left-sided porta catheter with loop in the neck but no kink. Unexpected tortuosity in the upper mediastinum which is likely azygos cannulation. Low volume chest.  No evidence of hemothorax or pneumothorax. IMPRESSION: Unexpected looping of the distal catheter, likely azygos cannulation. Negative for pneumothorax. Electronically Signed: By: Monte Fantasia M.D. On: 02/08/2021 11:46   DG C-Arm 1-60 Min-No Report  Result Date: 02/08/2021 Fluoroscopy was utilized by the requesting physician.  No radiographic interpretation.   US BREAST LTD UNI RIGHT INC AXILLA  Result Date: 02/07/2021 CLINICAL DATA:  55 year old female with biopsy-proven bilateral breast cancer presents for second-look ultrasound of the right axilla after a focal area of borderline cortical thickening was questioned on recent MRI. EXAM: ULTRASOUND OF THE RIGHT AXILLA COMPARISON:  Previous exam(s). FINDINGS: Ultrasound is performed, showing a questioned focal bulge of a bilobed lymph node deep in the right axilla with borderline thickening up to 3-4 mm. Multiple attempts at repositioning the patient were made. However, the lymph node remained deep within the axilla and surrounded by large vessels. A safe window for targeted biopsy could not be reproduced. IMPRESSION: Borderline  focal cortical thickening of a deep right axillary lymph node measuring 3-4 mm. Multiple attempts at repositioning the patient were made. However, a safe window for targeted biopsy could not be reproduced. RECOMMENDATION: Recommendation is to attempt ultrasound-guided seed localization of the questioned lymph node for excision at the time of the patient's definitive surgery. I have discussed the findings and recommendations with the patient. If applicable, a reminder letter will be sent to the patient regarding the next appointment. BI-RADS CATEGORY  4: Suspicious. Electronically Signed   By: Kristopher Oppenheim M.D.   On: 02/07/2021 09:30   ECHOCARDIOGRAM COMPLETE  Result Date: 02/07/2021    ECHOCARDIOGRAM REPORT   Patient Name:   HELVI ROYALS Date of Exam: 02/07/2021 Medical Rec #:  [id number]      Height:       67.0 in Accession #:    [id number]     Weight:       311.4 lb Date of Birth:  Jan 09, 1966      BSA:          2.441 m Patient Age:    55 years       BP:           147/81 mmHg Patient Gender: F              HR:           78 bpm. Exam Location:  Outpatient Procedure: 2D Echo,  Color Doppler, Cardiac Doppler and Strain Analysis Indications:    Chemo Z09  History:        Patient has prior history of Echocardiogram examinations, most                 recent 12/03/2018. Risk Factors:Hypertension.  Sonographer:    Bernadene Person RDCS Referring Phys: [place]  1. Left ventricular ejection fraction, by estimation, is 60 to 65%. The left ventricle has normal function. The left ventricle has no regional wall motion abnormalities. There is mild concentric left ventricular hypertrophy. Left ventricular diastolic parameters are consistent with Grade I diastolic dysfunction (impaired relaxation). The average left ventricular global longitudinal strain is -27.6 %. The global longitudinal strain is normal.  2. Right ventricular systolic function is normal. The right ventricular size is normal. There is  normal pulmonary artery systolic pressure.  3. The mitral valve is normal in structure. Trivial mitral valve regurgitation. No evidence of mitral stenosis.  4. The aortic valve is tricuspid. Aortic valve regurgitation is not visualized. No aortic stenosis is present.  5. Aortic dilatation noted. There is mild dilatation of the ascending aorta, measuring 42 mm.  6. The inferior vena cava is normal in size with greater than 50% respiratory variability, suggesting right atrial pressure of 3 mmHg. FINDINGS  Left Ventricle: Left ventricular ejection fraction, by estimation, is 60 to 65%. The left ventricle has normal function. The left ventricle has no regional wall motion abnormalities. The average left ventricular global longitudinal strain is -27.6 %. The global longitudinal strain is normal. The left ventricular internal cavity size was normal in size. There is mild concentric left ventricular hypertrophy. Left ventricular diastolic parameters are consistent with Grade I diastolic dysfunction (impaired relaxation). Normal left ventricular filling pressure. Right Ventricle: The right ventricular size is normal. No increase in right ventricular wall thickness. Right ventricular systolic function is normal. There is normal pulmonary artery systolic pressure. The tricuspid regurgitant velocity is 1.25 m/s, and  with an assumed right atrial pressure of 3 mmHg, the estimated right ventricular systolic pressure is 9.2 mmHg. Left Atrium: Left atrial size was normal in size. Right Atrium: Right atrial size was normal in size. Pericardium: Trivial pericardial effusion is present. Mitral Valve: The mitral valve is normal in structure. Trivial mitral valve regurgitation. No evidence of mitral valve stenosis. Tricuspid Valve: The tricuspid valve is normal in structure. Tricuspid valve regurgitation is trivial. No evidence of tricuspid stenosis. Aortic Valve: The aortic valve is tricuspid. Aortic valve regurgitation is not  visualized. No aortic stenosis is present. Pulmonic Valve: The pulmonic valve was normal in structure. Pulmonic valve regurgitation is not visualized. No evidence of pulmonic stenosis. Aorta: Aortic dilatation noted. There is mild dilatation of the ascending aorta, measuring 42 mm. Venous: The inferior vena cava is normal in size with greater than 50% respiratory variability, suggesting right atrial pressure of 3 mmHg. IAS/Shunts: No atrial level shunt detected by color flow Doppler.  LEFT VENTRICLE PLAX 2D LVIDd:         3.80 cm  Diastology LVIDs:         2.80 cm  LV e' medial:    9.14 cm/s LV PW:         1.10 cm  LV E/e' medial:  7.7 LV IVS:        1.10 cm  LV e' lateral:   12.30 cm/s LVOT diam:     2.10 cm  LV E/e' lateral: 5.8 LV SV:  77 LV SV Index:   32       2D Longitudinal Strain LVOT Area:     3.46 cm 2D Strain GLS (A2C):   -25.7 %                         2D Strain GLS (A3C):   -27.7 %                         2D Strain GLS (A4C):   -29.3 %                         2D Strain GLS Avg:     -27.6 % RIGHT VENTRICLE RV S prime:     13.10 cm/s TAPSE (M-mode): 2.2 cm LEFT ATRIUM             Index       RIGHT ATRIUM          Index LA diam:        3.50 cm 1.43 cm/m  RA Area:     9.27 cm LA Vol (A2C):   30.3 ml 12.41 ml/m RA Volume:   15.70 ml 6.43 ml/m LA Vol (A4C):   32.8 ml 13.44 ml/m LA Biplane Vol: 33.8 ml 13.85 ml/m  AORTIC VALVE LVOT Vmax:   118.50 cm/s LVOT Vmean:  82.250 cm/s LVOT VTI:    0.224 m  AORTA Ao Root diam: 3.00 cm Ao Asc diam:  4.20 cm MITRAL VALVE               TRICUSPID VALVE MV Area (PHT): 4.31 cm    TR Peak grad:   6.2 mmHg MV Decel Time: 176 msec    TR Vmax:        125.00 cm/s MV E velocity: 70.80 cm/s MV A velocity: 73.20 cm/s  SHUNTS MV E/A ratio:  0.97        Systemic VTI:  0.22 m                            Systemic Diam: 2.10 cm Skeet Latch MD Electronically signed by Skeet Latch MD Signature Date/Time: 02/07/2021/4:03:16 PM    Final    MM CLIP PLACEMENT  LEFT  Result Date: 01/20/2021 CLINICAL DATA:  Evaluate post biopsy marker clip placements following ultrasound-guided core needle biopsy of a left breast mass and stereotactic core needle biopsy of left breast calcifications. EXAM: DIAGNOSTIC LEFT MAMMOGRAM POST ULTRASOUND AND STEREOTACTIC BIOPSY COMPARISON:  Previous exam(s). FINDINGS: Mammographic images were obtained following ultrasound guided and stereotactic guided biopsy of the left breast. The biopsy marking clips are in expected position at the site of biopsy. The ribbon shaped biopsy clip lies within lateral aspect the left breast mass. The coil shaped biopsy clip lies in the expected location the lateral calcifications. IMPRESSION: Appropriate positioning of the coil and ribbon shaped biopsy marking clips at the site of biopsy in the left breast as detailed above. Final Assessment: Post Procedure Mammograms for Marker Placement Electronically Signed   By: Lajean Manes M.D.   On: 01/20/2021 10:19   MM CLIP PLACEMENT RIGHT  Result Date: 02/07/2021 CLINICAL DATA:  55 year old female status post stereotactic biopsy of the right breast. EXAM: DIAGNOSTIC RIGHT MAMMOGRAM POST STEREOTACTIC BIOPSY COMPARISON:  Previous exam(s). FINDINGS: Mammographic images were obtained following stereotactic guided biopsy of the right breast. The biopsy marking  clip is in expected position at the site of biopsy. IMPRESSION: Appropriate positioning of the X shaped biopsy marking clip at the site of biopsy in the upper inner right breast posteriorly. Final Assessment: Post Procedure Mammograms for Marker Placement Electronically Signed   By: Kristopher Oppenheim M.D.   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 This was found to be concordant by Dr. Lajean Manes.  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 This was found to be concordant by Dr. Lajean Manes. Pathology results were discussed with the patient by telephone. The patient reported doing well after the biopsies with minimal tenderness and bleeding at the sites. Post biopsy instructions and care were reviewed and questions were answered. The patient was encouraged to call [facility] for any additional concerns. My direct phone number was provided. The patient has a recent diagnosis of RIGHT breast cancer and should follow her outlined treatment plan. The patient is scheduled for a BILATERAL breast MRI on Feb 01, 2021. Pathology results reported by Terie Purser, RN on 01/24/2021. Electronically Signed   By: Lajean Manes M.D.   On: 01/24/2021 14:14   Result Date: 01/24/2021 CLINICAL DATA:  Patient presents for stereotactic core needle biopsy of left breast calcifications. Procedure followed ultrasound-guided core needle biopsy a palpable left breast mass. Patient underwent ultrasound-guided core needle biopsy of 2 right breast masses on 01/16/2021, 1 revealing invasive mammary carcinoma. EXAM: LEFT BREAST STEREOTACTIC CORE NEEDLE BIOPSY COMPARISON:  Previous exams. FINDINGS: The patient and I discussed the procedure of stereotactic-guided biopsy including benefits and alternatives. We discussed the high likelihood of a successful procedure. We discussed the risks of the procedure including infection, bleeding, tissue injury, clip migration, and inadequate sampling. Informed written consent was given. The usual time out protocol was performed immediately prior to the procedure. Using sterile technique and 1% Lidocaine as local anesthetic, under stereotactic guidance, a 9 gauge vacuum assisted device was used to perform core needle biopsy of calcifications in the central left breast  slightly towards the upper-outer quadrant using a lateral approach. Specimen radiograph was performed showing multiple calcifications for which biopsy was performed. Specimens with calcifications are identified for pathology. Lesion quadrant: Upper outer quadrant At the conclusion of the procedure, coil shaped tissue marker clip was deployed into the biopsy cavity. Follow-up 2-view mammogram was performed and dictated separately. IMPRESSION: Stereotactic-guided biopsy of left breast calcifications. No apparent complications. Electronically Signed: By: Lajean Manes M.D. 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 This was found to be concordant by Dr. Lajean Manes. 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 This was found to be concordant by Dr. Lajean Manes. Pathology results were discussed with the patient by telephone. The patient reported doing well after the biopsies with minimal tenderness and bleeding at the sites. Post biopsy instructions and care were reviewed and questions were answered. The patient was encouraged to call  [facility] for any additional concerns. My direct phone number was provided. The patient has a recent diagnosis of RIGHT breast cancer and should follow her outlined treatment plan. The patient is scheduled for a BILATERAL breast MRI on Feb 01, 2021. Pathology results reported by Terie Purser, RN on 01/24/2021. Electronically Signed   By: Lajean Manes M.D.   On: 01/24/2021 14:14   Result Date: 01/24/2021 CLINICAL DATA:  Patient presents for an ultrasound-guided core needle biopsy a palpable left breast mass. She has undergone biopsy 2 right  breast lesions on 01/16/2021, 1 of which revealed grade 2 invasive ductal carcinoma. Following today's ultrasound-guided core needle biopsy, the patient is to have a stereotactic core needle biopsy of left breast calcifications near the palpable left breast mass. EXAM: ULTRASOUND GUIDED LEFT BREAST CORE NEEDLE BIOPSY COMPARISON:  Previous exam(s). PROCEDURE: I met with the patient and we discussed the procedure of ultrasound-guided biopsy, including benefits and alternatives. We discussed the high likelihood of a successful procedure. We discussed the risks of the procedure, including infection, bleeding, tissue injury, clip migration, and inadequate sampling. Informed written consent was given. The usual time-out protocol was performed immediately prior to the procedure. Lesion quadrant: Lower outer quadrant Using sterile technique and 1% Lidocaine as local anesthetic, under direct ultrasound visualization, a 12 gauge spring-loaded device was used to perform biopsy of the palpable, 5.9 cm breast mass using an inferior approach. At the conclusion of the procedure a ribbon shaped tissue marker clip was deployed into the biopsy cavity. Follow up 2 view mammogram was performed and dictated separately. IMPRESSION: Ultrasound guided biopsy of a left breast mass. No apparent complications. Electronically Signed: By: Lajean Manes M.D. On: 01/20/2021 09:27   MM RT BREAST BX W LOC DEV 1ST LESION IMAGE BX SPEC STEREO GUIDE  Addendum Date: 02/10/2021   ADDENDUM REPORT: 02/10/2021 08:04 ADDENDUM: Pathology revealed COMPLEX SCLEROSING LESION of the RIGHT breast, upper inner quadrant, posterior. In addition there is a detached fragment of atypical appearing epithelium. However, deeper levels are more concerning for an atypical epithelial proliferation. In consideration of the patient's recently diagnosed invasive ductal carcinoma, excisional biopsy of this lesion is recommended. This was found to be concordant by Dr. Kristopher Oppenheim, with excision recommended. Pathology results were discussed with the patient by telephone. The patient reported doing well after the biopsy with tenderness at the site. Post biopsy instructions and care were reviewed and questions were answered. The patient was encouraged to call [facility] for any additional concerns. My direct phone number was provided. The patient has a recent diagnosis of BILATERAL breast cancer and should follow her outlined treatment plan. Pathology results reported by Terie Purser, RN on 02/10/2021. Electronically Signed   By: Kristopher Oppenheim M.D.   On: 02/10/2021 08:04   Result Date: 02/10/2021 CLINICAL DATA:  55 year old female with biopsy-proven bilateral breast cancer presents for stereotactic biopsy of an additional, indeterminate right breast mass. EXAM: RIGHT BREAST STEREOTACTIC CORE NEEDLE BIOPSY COMPARISON:  Previous exams. FINDINGS: The patient and I discussed the procedure of stereotactic-guided biopsy including benefits and alternatives. We discussed the high likelihood of a successful procedure. We discussed the risks of the procedure including infection, bleeding, tissue injury, clip migration, and inadequate sampling. Informed written consent was given. The usual time out protocol was performed immediately prior to the procedure. Using sterile technique and 1% Lidocaine as local anesthetic, under stereotactic guidance, a 9 gauge vacuum assisted device was used  to perform core needle biopsy of a mass in the upper inner quadrant of the right breast using a superior approach. Lesion quadrant: Upper inner quadrant At the conclusion of the procedure, an X shaped tissue marker clip was deployed into the biopsy cavity. Follow-up 2-view mammogram was performed and dictated separately. IMPRESSION: Stereotactic-guided biopsy of the right breast. No apparent complications. Electronically Signed: By: Kristopher Oppenheim M.D. On: 02/07/2021 09:13     ASSESSMENT: ANNASTYN SILVEY is a  55 y.o. 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  (a) secondary biopsy shows ductal carcinoma in situ associated with extensive patchy enhancement  (3) staging studies:  (a) chest CT scan 02/06/2021 shows hepatic steatosis and degenerative disc disease, no evidence of metastatic disease  (b) bone scan 02/06/2021 shows no evidence of metastatic disease  neoadjuvant chemotherapy will start 02/09/2021 consisting of pembrolizumab/carboplatin/paclitaxel followed by pembrolizumab/doxorubicin/cyclophosphamide  (4) definitive surgery to follow  (5) adjuvant radiation to bilateral sites  (6) G enetics testing: No pathogenic variants identified on the [facility] Multi-Cancer Panel+RNA. VUS in ATM called [id number] identified. The report date is 02/13/2021.      PLAN: -Ms. Shroff presents today, 02/17/2021 for a follow up prior to C1D8 with Paclitaxel. Labs from today were reviewed without any intervention needed. I recommend to proceed with treatment as planned. Patient is scheduled for C1D15 on 02/23/2021 and will return to the clinic on 03/02/2021 prior to [place].   -Chest xray from 02/10/2021 revealed left chest port remained curled over the region of the azygos vein. Patient underwent CT chest on 02/15/2021 that left-sided chest port catheter courses down the azygos  vein with tip near the level of the aortic hiatus. Patient will follow up with Dr. Barry Dienes to determine any intervention is needed.   Patient expressed understanding and satisfaction with the plan provided.   I have spent a total of 35 minutes minutes of face-to-face and non-face-to-face time, preparing to see the patient, obtaining and/or reviewing separately obtained history, performing a medically appropriate examination, counseling and educating the patient,documenting clinical information in the electronic health record and care coordination.   Dede Query PA-C 02/17/2021 11:17 AM Medical Oncology and Hematology [facility] [place], [place] [address] Tel. [phone]

## 2021-02-18 ENCOUNTER — Other Ambulatory Visit: Payer: Self-pay | Admitting: General Surgery

## 2021-02-20 ENCOUNTER — Other Ambulatory Visit: Payer: Self-pay

## 2021-02-20 ENCOUNTER — Encounter (HOSPITAL_BASED_OUTPATIENT_CLINIC_OR_DEPARTMENT_OTHER): Payer: Self-pay | Admitting: General Surgery

## 2021-02-20 ENCOUNTER — Encounter: Payer: Self-pay | Admitting: *Deleted

## 2021-02-20 NOTE — Progress Notes (Signed)
Spoke w/ via phone for pre-op interview---pt Lab needs dos----I stat               Lab results------cbc with dif cmet done 02-17-2021 before chemo one 5-20-220, ekg 02-07-2021 epic, echo 02-07-2021 epic COVID test -----patient states asymptomatic no test needed Arrive at ------- 1145 am 02-21-2021 NPO after MN NO Solid Food.  Clear liquids from MN until---1045 am then npo Med rec completed Medications to take morning of surgery -----omeprazoleDiabetic medication ----- Patient instructed to bring photo id and insurance card day of surgery Patient aware to have Driver (ride ) / caregiver   Brother timmy saephan will stay  for 24 hours after surgery  Patient Special Instructions -----none Pre-Op special Istructions -----nonePatient verbalized understanding of instructions that were given at this phone interview. Patient denies shortness of breath, chest pain, fever, cough at this phone interview.  Pt chart and history reviewed by dr Elgie Congo mda, pt ok for [facility] barring any acute status changes per dr Elgie Congo mda

## 2021-02-20 NOTE — Anesthesia Preprocedure Evaluation (Addendum)
Anesthesia Evaluation  Patient identified by MRN, date of birth, ID band Patient awake    Reviewed: Allergy & Precautions, NPO status , Patient's Chart, lab work & pertinent test results  History of Anesthesia Complications (+) PONV, PROLONGED EMERGENCE and history of anesthetic complications  Airway Mallampati: II  TM Distance: >3 FB Neck ROM: Full    Dental  (+) Partial Upper, Poor Dentition, Loose, Missing, Dental Advisory Given   Pulmonary sleep apnea (cannot tolerate CPAP) , former smoker,    breath sounds clear to auscultation       Cardiovascular hypertension, Pt. on medications (-) angina+CHF (grade 1 diastolic dysfunction)   Rhythm:Regular Rate:Normal  Echo 02/07/21: 1. Left ventricular ejection fraction, by estimation, is 60 to 65%. The  left ventricle has normal function. The left ventricle has no regional  wall motion abnormalities. There is mild concentric left ventricular  hypertrophy. Left ventricular diastolic  parameters are consistent with Grade I diastolic dysfunction (impaired  relaxation). The average left ventricular global longitudinal strain is  -27.6 %. The global longitudinal strain is normal.  2. Right ventricular systolic function is normal. The right ventricular  size is normal. There is normal pulmonary artery systolic pressure.  3. The mitral valve is normal in structure. Trivial mitral valve  regurgitation. No evidence of mitral stenosis.  4. The aortic valve is tricuspid. Aortic valve regurgitation is not  visualized. No aortic stenosis is present.  5. Aortic dilatation noted. There is mild dilatation of the ascending  aorta, measuring 42 mm.  6. The inferior vena cava is normal in size with greater than 50%  respiratory variability, suggesting right atrial pressure of 3 mmHg.    Neuro/Psych  Headaches, negative psych ROS   GI/Hepatic Neg liver ROS, hiatal hernia, GERD  Medicated and  Controlled,  Endo/Other  diabetes (pre-diabetic, glu 83)Morbid obesityBMI 48 a1c 6.9  Renal/GU negative Renal ROS  negative genitourinary   Musculoskeletal  (+) Arthritis , Osteoarthritis,    Abdominal (+) + obese,   Peds  Hematology negative hematology ROS (+)   Anesthesia Other Findings Breast ca s/p port placement 02/08/21  Reproductive/Obstetrics negative OB ROS                           Anesthesia Physical Anesthesia Plan  ASA: III  Anesthesia Plan: General   Post-op Pain Management:    Induction: Intravenous  PONV Risk Score and Plan: 4 or greater and Scopolamine patch - Pre-op, Dexamethasone, Ondansetron and Treatment may vary due to age or medical condition  Airway Management Planned: Oral ETT  Additional Equipment: None  Intra-op Plan:   Post-operative Plan: Extubation in OR  Informed Consent: I have reviewed the patients History and Physical, chart, labs and discussed the procedure including the risks, benefits and alternatives for the proposed anesthesia with the patient or authorized representative who has indicated his/her understanding and acceptance.     Dental advisory given  Plan Discussed with: CRNA and Surgeon  Anesthesia Plan Comments: (PAT reviewed by Doroteo Glassman- did well with GA for port placement a couple weeks ago (GA/ETT- grade 1 view with miller 2), approved for [facility] for port revision.)      Anesthesia Quick Evaluation

## 2021-02-21 ENCOUNTER — Ambulatory Visit (HOSPITAL_COMMUNITY): Payer: 59

## 2021-02-21 ENCOUNTER — Encounter (HOSPITAL_BASED_OUTPATIENT_CLINIC_OR_DEPARTMENT_OTHER): Payer: Self-pay | Admitting: General Surgery

## 2021-02-21 ENCOUNTER — Other Ambulatory Visit: Payer: Self-pay

## 2021-02-21 ENCOUNTER — Encounter (HOSPITAL_BASED_OUTPATIENT_CLINIC_OR_DEPARTMENT_OTHER)
Admission: RE | Disposition: A | Discharge status: Home / Self Care | Payer: Self-pay | Source: Home / Self Care | Attending: General Surgery

## 2021-02-21 ENCOUNTER — Ambulatory Visit (HOSPITAL_BASED_OUTPATIENT_CLINIC_OR_DEPARTMENT_OTHER): Payer: 59 | Admitting: Anesthesiology

## 2021-02-21 ENCOUNTER — Ambulatory Visit (HOSPITAL_BASED_OUTPATIENT_CLINIC_OR_DEPARTMENT_OTHER)
Admission: RE | Admit: 2021-02-21 | Discharge: 2021-02-21 | Disposition: A | Discharge status: Home / Self Care | Payer: 59 | Attending: General Surgery | Admitting: General Surgery

## 2021-02-21 DIAGNOSIS — Z833 Family history of diabetes mellitus: Secondary | ICD-10-CM | POA: Insufficient documentation

## 2021-02-21 DIAGNOSIS — I110 Hypertensive heart disease with heart failure: Secondary | ICD-10-CM | POA: Diagnosis not present

## 2021-02-21 DIAGNOSIS — Z8349 Family history of other endocrine, nutritional and metabolic diseases: Secondary | ICD-10-CM | POA: Diagnosis not present

## 2021-02-21 DIAGNOSIS — Z87891 Personal history of nicotine dependence: Secondary | ICD-10-CM | POA: Insufficient documentation

## 2021-02-21 DIAGNOSIS — Z800 Family history of malignant neoplasm of digestive organs: Secondary | ICD-10-CM | POA: Diagnosis not present

## 2021-02-21 DIAGNOSIS — T82524A Displacement of infusion catheter, initial encounter: Secondary | ICD-10-CM | POA: Insufficient documentation

## 2021-02-21 DIAGNOSIS — X58XXXA Exposure to other specified factors, initial encounter: Secondary | ICD-10-CM | POA: Insufficient documentation

## 2021-02-21 DIAGNOSIS — Z8042 Family history of malignant neoplasm of prostate: Secondary | ICD-10-CM | POA: Insufficient documentation

## 2021-02-21 DIAGNOSIS — C50911 Malignant neoplasm of unspecified site of right female breast: Secondary | ICD-10-CM | POA: Diagnosis not present

## 2021-02-21 DIAGNOSIS — Z8249 Family history of ischemic heart disease and other diseases of the circulatory system: Secondary | ICD-10-CM | POA: Diagnosis not present

## 2021-02-21 DIAGNOSIS — I509 Heart failure, unspecified: Secondary | ICD-10-CM | POA: Diagnosis not present

## 2021-02-21 DIAGNOSIS — C50412 Malignant neoplasm of upper-outer quadrant of left female breast: Secondary | ICD-10-CM | POA: Diagnosis not present

## 2021-02-21 DIAGNOSIS — Z8379 Family history of other diseases of the digestive system: Secondary | ICD-10-CM | POA: Insufficient documentation

## 2021-02-21 DIAGNOSIS — R7303 Prediabetes: Secondary | ICD-10-CM | POA: Insufficient documentation

## 2021-02-21 DIAGNOSIS — Z95828 Presence of other vascular implants and grafts: Secondary | ICD-10-CM

## 2021-02-21 DIAGNOSIS — Z801 Family history of malignant neoplasm of trachea, bronchus and lung: Secondary | ICD-10-CM | POA: Insufficient documentation

## 2021-02-21 DIAGNOSIS — T82398A Other mechanical complication of other vascular grafts, initial encounter: Secondary | ICD-10-CM | POA: Diagnosis not present

## 2021-02-21 DIAGNOSIS — Z823 Family history of stroke: Secondary | ICD-10-CM | POA: Insufficient documentation

## 2021-02-21 DIAGNOSIS — E559 Vitamin D deficiency, unspecified: Secondary | ICD-10-CM | POA: Diagnosis not present

## 2021-02-21 DIAGNOSIS — C50912 Malignant neoplasm of unspecified site of left female breast: Secondary | ICD-10-CM | POA: Diagnosis not present

## 2021-02-21 DIAGNOSIS — Z7982 Long term (current) use of aspirin: Secondary | ICD-10-CM | POA: Insufficient documentation

## 2021-02-21 DIAGNOSIS — E782 Mixed hyperlipidemia: Secondary | ICD-10-CM | POA: Diagnosis not present

## 2021-02-21 DIAGNOSIS — I10 Essential (primary) hypertension: Secondary | ICD-10-CM | POA: Diagnosis not present

## 2021-02-21 DIAGNOSIS — Z8371 Family history of colonic polyps: Secondary | ICD-10-CM | POA: Insufficient documentation

## 2021-02-21 DIAGNOSIS — Z808 Family history of malignant neoplasm of other organs or systems: Secondary | ICD-10-CM | POA: Diagnosis not present

## 2021-02-21 DIAGNOSIS — Z803 Family history of malignant neoplasm of breast: Secondary | ICD-10-CM | POA: Diagnosis not present

## 2021-02-21 DIAGNOSIS — Z79899 Other long term (current) drug therapy: Secondary | ICD-10-CM | POA: Insufficient documentation

## 2021-02-21 DIAGNOSIS — R0600 Dyspnea, unspecified: Secondary | ICD-10-CM | POA: Diagnosis not present

## 2021-02-21 HISTORY — DX: Presence of dental prosthetic device (complete) (partial): Z97.2

## 2021-02-21 HISTORY — PX: PORT A CATH REVISION: SHX6033

## 2021-02-21 HISTORY — DX: Personal history of antineoplastic chemotherapy: Z92.21

## 2021-02-21 HISTORY — DX: Personal history of other diseases of the digestive system: Z87.19

## 2021-02-21 LAB — GLUCOSE, CAPILLARY: Glucose-Capillary: 83 mg/dL (ref 70–99)

## 2021-02-21 SURGERY — PORT A CATH REVISION
Anesthesia: General | Site: Breast | Laterality: Left

## 2021-02-21 MED ORDER — PHENYLEPHRINE 40 MCG/ML (10ML) SYRINGE FOR IV PUSH (FOR BLOOD PRESSURE SUPPORT)
PREFILLED_SYRINGE | INTRAVENOUS | Status: DC | PRN
  Administered 2021-02-21: 120 ug via INTRAVENOUS

## 2021-02-21 MED ORDER — ONDANSETRON HCL 4 MG/2ML IJ SOLN
INTRAMUSCULAR | Status: DC | PRN
  Administered 2021-02-21: 4 mg via INTRAVENOUS

## 2021-02-21 MED ORDER — PROPOFOL 10 MG/ML IV BOLUS
INTRAVENOUS | Status: AC
  Filled 2021-02-21: qty 20

## 2021-02-21 MED ORDER — SUGAMMADEX SODIUM 200 MG/2ML IV SOLN
INTRAVENOUS | Status: DC | PRN
  Administered 2021-02-21: 300 mg via INTRAVENOUS

## 2021-02-21 MED ORDER — CEFAZOLIN SODIUM-DEXTROSE 2-4 GM/100ML-% IV SOLN
2.0000 g | INTRAVENOUS | Status: AC
  Administered 2021-02-21: 2 g via INTRAVENOUS

## 2021-02-21 MED ORDER — CHLORHEXIDINE GLUCONATE CLOTH 2 % EX PADS: 6.0000 | MEDICATED_PAD | Freq: Once | CUTANEOUS | Status: DC

## 2021-02-21 MED ORDER — PROMETHAZINE HCL 25 MG/ML IJ SOLN: 6.2500 mg | INTRAMUSCULAR | Status: DC | PRN

## 2021-02-21 MED ORDER — LIDOCAINE 2% (20 MG/ML) 5 ML SYRINGE
INTRAMUSCULAR | Status: AC
  Filled 2021-02-21: qty 5

## 2021-02-21 MED ORDER — BUPIVACAINE-EPINEPHRINE 0.25% -1:200000 IJ SOLN
INTRAMUSCULAR | Status: DC | PRN
  Administered 2021-02-21: 3.5 mL

## 2021-02-21 MED ORDER — ROCURONIUM BROMIDE 10 MG/ML (PF) SYRINGE
PREFILLED_SYRINGE | INTRAVENOUS | Status: AC
  Filled 2021-02-21: qty 10

## 2021-02-21 MED ORDER — ROCURONIUM BROMIDE 10 MG/ML (PF) SYRINGE
PREFILLED_SYRINGE | INTRAVENOUS | Status: DC | PRN
  Administered 2021-02-21: 60 mg via INTRAVENOUS

## 2021-02-21 MED ORDER — PROPOFOL 10 MG/ML IV BOLUS
INTRAVENOUS | Status: DC | PRN
  Administered 2021-02-21: 50 mg via INTRAVENOUS
  Administered 2021-02-21: 250 mg via INTRAVENOUS

## 2021-02-21 MED ORDER — LACTATED RINGERS IV SOLN: INTRAVENOUS | Status: DC

## 2021-02-21 MED ORDER — MIDAZOLAM HCL 2 MG/2ML IJ SOLN
INTRAMUSCULAR | Status: AC
  Filled 2021-02-21: qty 2

## 2021-02-21 MED ORDER — ACETAMINOPHEN 500 MG PO TABS
ORAL_TABLET | ORAL | Status: AC
  Filled 2021-02-21: qty 2

## 2021-02-21 MED ORDER — OXYCODONE HCL 5 MG PO TABS: 5.0000 mg | ORAL_TABLET | Freq: Once | ORAL | Status: DC | PRN

## 2021-02-21 MED ORDER — SCOPOLAMINE 1 MG/3DAYS TD PT72
MEDICATED_PATCH | TRANSDERMAL | Status: AC
  Filled 2021-02-21: qty 1

## 2021-02-21 MED ORDER — EPHEDRINE SULFATE 50 MG/ML IJ SOLN
INTRAMUSCULAR | Status: DC | PRN
  Administered 2021-02-21: 10 mg via INTRAVENOUS

## 2021-02-21 MED ORDER — ACETAMINOPHEN 500 MG PO TABS
1000.0000 mg | ORAL_TABLET | Freq: Once | ORAL | Status: AC
  Administered 2021-02-21: 1000 mg via ORAL

## 2021-02-21 MED ORDER — LIDOCAINE 1% INJECTION FOR CIRCUMCISION
INJECTION | INTRAVENOUS | Status: DC | PRN
  Administered 2021-02-21: 3.5 mL via SUBCUTANEOUS

## 2021-02-21 MED ORDER — SCOPOLAMINE 1 MG/3DAYS TD PT72
1.0000 | MEDICATED_PATCH | TRANSDERMAL | Status: DC
  Administered 2021-02-21: 1.5 mg via TRANSDERMAL

## 2021-02-21 MED ORDER — DEXAMETHASONE SODIUM PHOSPHATE 10 MG/ML IJ SOLN
INTRAMUSCULAR | Status: DC | PRN
  Administered 2021-02-21: 5 mg via INTRAVENOUS

## 2021-02-21 MED ORDER — CEFAZOLIN SODIUM-DEXTROSE 2-4 GM/100ML-% IV SOLN
INTRAVENOUS | Status: AC
  Filled 2021-02-21: qty 100

## 2021-02-21 MED ORDER — OXYCODONE HCL 5 MG/5ML PO SOLN: 5.0000 mg | Freq: Once | ORAL | Status: DC | PRN

## 2021-02-21 MED ORDER — MIDAZOLAM HCL 2 MG/2ML IJ SOLN: 0.5000 mg | Freq: Once | INTRAMUSCULAR | Status: DC | PRN

## 2021-02-21 MED ORDER — HEPARIN SOD (PORK) LOCK FLUSH 100 UNIT/ML IV SOLN
INTRAVENOUS | Status: DC | PRN
  Administered 2021-02-21: 500 [IU] via INTRAVENOUS

## 2021-02-21 MED ORDER — HYDROMORPHONE HCL 1 MG/ML IJ SOLN: 0.2500 mg | INTRAMUSCULAR | Status: DC | PRN

## 2021-02-21 MED ORDER — ACETAMINOPHEN 500 MG PO TABS: 1000.0000 mg | ORAL_TABLET | ORAL | Status: DC

## 2021-02-21 MED ORDER — LIDOCAINE 2% (20 MG/ML) 5 ML SYRINGE
INTRAMUSCULAR | Status: DC | PRN
  Administered 2021-02-21: 50 mg via INTRAVENOUS

## 2021-02-21 MED ORDER — FENTANYL CITRATE (PF) 100 MCG/2ML IJ SOLN
INTRAMUSCULAR | Status: AC
  Filled 2021-02-21: qty 2

## 2021-02-21 MED ORDER — MIDAZOLAM HCL 5 MG/5ML IJ SOLN
INTRAMUSCULAR | Status: DC | PRN
  Administered 2021-02-21: 2 mg via INTRAVENOUS

## 2021-02-21 MED ORDER — ONDANSETRON HCL 4 MG/2ML IJ SOLN
INTRAMUSCULAR | Status: AC
  Filled 2021-02-21: qty 2

## 2021-02-21 MED ORDER — SODIUM CHLORIDE 0.9 % IV SOLN
Freq: Once | INTRAVENOUS | Status: AC
  Administered 2021-02-21: 17 mL
  Filled 2021-02-21 (×2): qty 1.2

## 2021-02-21 MED ORDER — MEPERIDINE HCL 25 MG/ML IJ SOLN: 6.2500 mg | INTRAMUSCULAR | Status: DC | PRN

## 2021-02-21 MED ORDER — FENTANYL CITRATE (PF) 100 MCG/2ML IJ SOLN
INTRAMUSCULAR | Status: DC | PRN
  Administered 2021-02-21: 100 ug via INTRAVENOUS

## 2021-02-21 SURGICAL SUPPLY — 46 items
ADH SKN CLS APL DERMABOND .7 (GAUZE/BANDAGES/DRESSINGS) ×1
APL PRP STRL LF DISP 70% ISPRP (MISCELLANEOUS) ×1
BAG DECANTER FOR FLEXI CONT (MISCELLANEOUS) ×3 IMPLANT
BLADE HEX COATED 2.75 (ELECTRODE) ×2 IMPLANT
BLADE SURG 11 STRL SS (BLADE) ×2 IMPLANT
BLADE SURG 15 STRL LF DISP TIS (BLADE) ×1 IMPLANT
BLADE SURG 15 STRL SS (BLADE) ×2
CHLORAPREP W/TINT 26 (MISCELLANEOUS) ×2 IMPLANT
COVER BACK TABLE 60X90IN (DRAPES) ×2 IMPLANT
COVER MAYO STAND STRL (DRAPES) ×2 IMPLANT
COVER PROBE U/S 5X48 (MISCELLANEOUS) ×1 IMPLANT
COVER WAND RF STERILE (DRAPES) ×2 IMPLANT
DECANTER SPIKE VIAL GLASS SM (MISCELLANEOUS) ×2 IMPLANT
DERMABOND ADVANCED (GAUZE/BANDAGES/DRESSINGS) ×1
DERMABOND ADVANCED .7 DNX12 (GAUZE/BANDAGES/DRESSINGS) ×1 IMPLANT
DRAPE C-ARM 42X120 X-RAY (DRAPES) ×2 IMPLANT
DRAPE LAPAROTOMY TRNSV 102X78 (DRAPES) ×2 IMPLANT
DRAPE UTILITY XL STRL (DRAPES) ×2 IMPLANT
DRSG TEGADERM 4X4.75 (GAUZE/BANDAGES/DRESSINGS) IMPLANT
ELECT REM PT RETURN 9FT ADLT (ELECTROSURGICAL) ×2
ELECTRODE REM PT RTRN 9FT ADLT (ELECTROSURGICAL) ×1 IMPLANT
GAUZE SPONGE 4X4 12PLY STRL (GAUZE/BANDAGES/DRESSINGS) IMPLANT
GLOVE SURG ENC MOIS LTX SZ6 (GLOVE) ×2 IMPLANT
GLOVE SURG ENC MOIS LTX SZ7 (GLOVE) ×1 IMPLANT
GLOVE SURG UNDER LTX SZ6.5 (GLOVE) ×2 IMPLANT
GLOVE SURG UNDER POLY LF SZ7 (GLOVE) ×1 IMPLANT
GOWN STRL REUS W/TWL 2XL LVL3 (GOWN DISPOSABLE) ×2 IMPLANT
GOWN STRL REUS W/TWL LRG LVL3 (GOWN DISPOSABLE) ×1 IMPLANT
KIT PORT POWER 8FR ISP CVUE (Port) ×1 IMPLANT
KIT TURNOVER CYSTO (KITS) ×2 IMPLANT
MANIFOLD NEPTUNE II (INSTRUMENTS) IMPLANT
NDL HYPO 25X1 1.5 SAFETY (NEEDLE) ×1 IMPLANT
NEEDLE HYPO 25X1 1.5 SAFETY (NEEDLE) ×2 IMPLANT
PACK BASIN DAY SURGERY FS (CUSTOM PROCEDURE TRAY) ×2 IMPLANT
PENCIL SMOKE EVACUATOR (MISCELLANEOUS) ×2 IMPLANT
SLEEVE SCD COMPRESS KNEE MED (STOCKING) ×2 IMPLANT
SUT MNCRL AB 4-0 PS2 18 (SUTURE) ×2 IMPLANT
SUT PROLENE 2 0 SH DA (SUTURE) ×5 IMPLANT
SUT VIC AB 3-0 SH 27 (SUTURE) ×2
SUT VIC AB 3-0 SH 27X BRD (SUTURE) ×1 IMPLANT
SUT VICRYL 3-0 CR8 SH (SUTURE) IMPLANT
SYR 10ML LL (SYRINGE) ×2 IMPLANT
SYR 5ML LUER SLIP (SYRINGE) ×2 IMPLANT
SYR CONTROL 10ML LL (SYRINGE) ×2 IMPLANT
TOWEL OR 17X26 10 PK STRL BLUE (TOWEL DISPOSABLE) ×2 IMPLANT
TUBE CONNECTING 12X1/4 (SUCTIONS) IMPLANT

## 2021-02-21 NOTE — Transfer of Care (Signed)
Immediate Anesthesia Transfer of Care Note  Patient: Leslie Wright  Procedure(s) Performed: PORT A CATH REVISION (Left Breast)  Patient Location: PACU  Anesthesia Type:General  Level of Consciousness: awake, alert  and oriented  Airway & Oxygen Therapy: Patient Spontanous Breathing and Patient connected to nasal cannula oxygen  Post-op Assessment: Report given to RN  Post vital signs: Reviewed and stable  Last Vitals:  Vitals Value Taken Time  BP 118/81   Temp    Pulse 64   Resp 18   SpO2 100%     Last Pain:  Vitals:   02/21/21 1206  TempSrc: Oral  PainSc: 0-No pain      Patients Stated Pain Goal: 4 (59/74/71 8550)  Complications: No complications documented.

## 2021-02-21 NOTE — Anesthesia Procedure Notes (Signed)
Procedure Name: Intubation Date/Time: 02/21/2021 1:39 PM Performed by: Bonney Aid, CRNA Pre-anesthesia Checklist: Patient identified, Emergency Drugs available, Suction available and Patient being monitored Patient Re-evaluated:Patient Re-evaluated prior to induction Oxygen Delivery Method: Circle system utilized Preoxygenation: Pre-oxygenation with 100% oxygen Induction Type: IV induction Ventilation: Mask ventilation without difficulty Laryngoscope Size: Mac and 3 Grade View: Grade I Tube type: Oral Tube size: 7.0 mm Number of attempts: 1 Airway Equipment and Method: Stylet Placement Confirmation: ETT inserted through vocal cords under direct vision,  positive ETCO2 and breath sounds checked- equal and bilateral Secured at: 21 cm Tube secured with: Tape Dental Injury: Teeth and Oropharynx as per pre-operative assessment

## 2021-02-21 NOTE — Anesthesia Postprocedure Evaluation (Signed)
Anesthesia Post Note  Patient: Leslie Wright  Procedure(s) Performed: PORT A CATH REVISION (Left Breast)     Patient location during evaluation: PACU Anesthesia Type: General Level of consciousness: awake and alert, patient cooperative and oriented Pain management: pain level controlled Vital Signs Assessment: post-procedure vital signs reviewed and stable Respiratory status: spontaneous breathing, nonlabored ventilation and respiratory function stable Cardiovascular status: blood pressure returned to baseline and stable Postop Assessment: no apparent nausea or vomiting, able to ambulate and adequate PO intake Anesthetic complications: no   No complications documented.  Last Vitals:  Vitals:   02/21/21 1515 02/21/21 1530  BP: 136/81 133/78  Pulse: (!) 57 64  Resp: 11 17  Temp:    SpO2: 100% 96%    Last Pain:  Vitals:   02/21/21 1445  TempSrc:   PainSc: 0-No pain                 Arohi Salvatierra,E. Yaasir Menken

## 2021-02-21 NOTE — Discharge Instructions (Addendum)
POST OP INSTRUCTIONS  Always review your discharge instruction sheet given to you by the facility where your surgery was performed.  IF YOU HAVE DISABILITY OR FAMILY LEAVE FORMS, YOU MUST BRING THEM TO THE OFFICE FOR PROCESSING.  DO NOT GIVE THEM TO YOUR DOCTOR.  1. A prescription for pain medication may be given to you upon discharge.  Take your pain medication as prescribed, if needed.  If narcotic pain medicine is not needed, then you may take acetaminophen (Tylenol) or ibuprofen (Advil) as needed. 2. Take your usually prescribed medications unless otherwise directed 3. If you need a refill on your pain medication, please contact your pharmacy.  They will contact our office to request authorization.  Prescriptions will not be filled after 5pm or on week-ends. 4. You should eat very light the first 24 hours after surgery, such as soup, crackers, pudding, etc.  Resume your normal diet the day after surgery 5. It is common to experience some constipation if taking pain medication after surgery.  Increasing fluid intake and taking a stool softener will usually help or prevent this problem from occurring.  A mild laxative (Milk of Magnesia or Miralax) should be taken according to package directions if there are no bowel movements after 48 hours. 6. You may shower in 48 hours.  The surgical glue will flake off in 2-3 weeks.   7. ACTIVITIES:  No strenuous activity or heavy lifting for 1 week.   a. You may drive when you no longer are taking prescription pain medication, you can comfortably wear a seatbelt, and you can safely maneuver your car and apply brakes. b. RETURN TO WORK:  __________to be determined_______________ Dennis Bast should see your doctor in the office for a follow-up appointment approximately three-four weeks after your surgery.    WHEN TO CALL YOUR DOCTOR: 1. Fever over 101.0 2. Nausea and/or vomiting. 3. Extreme swelling or bruising. 4. Continued bleeding from incision. 5. Increased  pain, redness, or drainage from the incision.  The clinic staff is available to answer your questions during regular business hours.  Please don't hesitate to call and ask to speak to one of the nurses for clinical concerns.  If you have a medical emergency, go to the nearest emergency room or call 911.  A surgeon from [facility] is always on call at the hospital.  For further questions, please visit [email or web address]     Implanted Port Insertion, Care After This sheet gives you information about how to care for yourself after your procedure. Your health care provider may also give you more specific instructions. If you have problems or questions, contact your health care provider. What can I expect after the procedure? After the procedure, it is common to have:  Discomfort at the port insertion site.  Bruising on the skin over the port. This should improve over 3-4 days. Follow these instructions at home: [facility] care  After your port is placed, you will get a manufacturer's information card. The card has information about your port. Keep this card with you at all times.  Take care of the port as told by your health care provider. Ask your health care provider if you or a family member can get training for taking care of the port at home. A home health care nurse may also take care of the port.  Make sure to remember what type of port you have. Incision care  Follow instructions from your health care provider about how to take care of  your port insertion site. Make sure you: ? Wash your hands with soap and water before and after you change your bandage (dressing). If soap and water are not available, use hand sanitizer. ? Change your dressing as told by your health care provider. ? Leave stitches (sutures), skin glue, or adhesive strips in place. These skin closures may need to stay in place for 2 weeks or longer. If adhesive strip edges start to loosen and curl up,  you may trim the loose edges. Do not remove adhesive strips completely unless your health care provider tells you to do that.  Check your port insertion site every day for signs of infection. Check for: ? Redness, swelling, or pain. ? Fluid or blood. ? Warmth. ? Pus or a bad smell.      Activity  Return to your normal activities as told by your health care provider. Ask your health care provider what activities are safe for you.  Do not lift anything that is heavier than 10 lb (4.5 kg), or the limit that you are told, until your health care provider says that it is safe. General instructions  Take over-the-counter and prescription medicines only as told by your health care provider.  Do not take baths, swim, or use a hot tub until your health care provider approves. Ask your health care provider if you may take showers. You may only be allowed to take sponge baths.  Do not drive for 24 hours if you were given a sedative during your procedure.  Wear a medical alert bracelet in case of an emergency. This will tell any health care providers that you have a port.  Keep all follow-up visits as told by your health care provider. This is important. Contact a health care provider if:  You cannot flush your port with saline as directed, or you cannot draw blood from the port.  You have a fever or chills.  You have redness, swelling, or pain around your port insertion site.  You have fluid or blood coming from your port insertion site.  Your port insertion site feels warm to the touch.  You have pus or a bad smell coming from the port insertion site. Get help right away if:  You have chest pain or shortness of breath.  You have bleeding from your port that you cannot control. Summary  Take care of the port as told by your health care provider. Keep the manufacturer's information card with you at all times.  Change your dressing as told by your health care provider.  Contact a  health care provider if you have a fever or chills or if you have redness, swelling, or pain around your port insertion site.  Keep all follow-up visits as told by your health care provider. This information is not intended to replace advice given to you by your health care provider. Make sure you discuss any questions you have with your health care provider. Document Revised: 04/15/2018 Document Reviewed: 04/15/2018 Elsevier Patient Education  [place] Office Phone Number [phone]       Post Anesthesia Home Care Instructions  Activity: Get plenty of rest for the remainder of the day. A responsible individual must stay with you for 24 hours following the procedure.  For the next 24 hours, DO NOT: -Drive a car -Paediatric nurse -Drink alcoholic beverages -Take any medication unless instructed by your physician -Make any legal decisions or sign important papers.  Meals: Start with liquid foods  such as gelatin or soup. Progress to regular foods as tolerated. Avoid greasy, spicy, heavy foods. If nausea and/or vomiting occur, drink only clear liquids until the nausea and/or vomiting subsides. Call your physician if vomiting continues.  Special Instructions/Symptoms: Your throat may feel dry or sore from the anesthesia or the breathing tube placed in your throat during surgery. If this causes discomfort, gargle with warm salt water. The discomfort should disappear within 24 hours.  If you had a scopolamine patch placed behind your ear for the management of post- operative nausea and/or vomiting:  1. The medication in the patch is effective for 72 hours, after which it should be removed.  Wrap patch in a tissue and discard in the trash. Wash hands thoroughly with soap and water. 2. You may remove the patch earlier than 72 hours if you experience unpleasant side effects which may include dry mouth, dizziness or visual disturbances. 3. Avoid  touching the patch. Wash your hands with soap and water after contact with the patch.

## 2021-02-21 NOTE — Interval H&P Note (Signed)
History and Physical Interval Note:  02/21/2021 1:10 PM  Leslie Wright  has presented today for surgery, with the diagnosis of BILATERAL BREAST CANCER.  The various methods of treatment have been discussed with the patient and family. After consideration of risks, benefits and other options for treatment, the patient has consented to  Procedure(s) with comments: PORT A CATH REVISION (N/A) - 60 MINUTES ROOM 5 as a surgical intervention.  The patient's history has been reviewed, patient examined, no change in status, stable for surgery.  I have reviewed the patient's chart and labs.  Questions were answered to the patient's satisfaction.     Stark Klein

## 2021-02-21 NOTE — Op Note (Signed)
PREOPERATIVE DIAGNOSIS:  Malpositioned port     POSTOPERATIVE DIAGNOSIS:  Same     PROCEDURE: left subclavian port placement, Bard ClearVue  Power Port, MRI safe, 8-French, removal of left IJ port     SURGEON:  Stark Klein, MD      ANESTHESIA:  General   FINDINGS:  Good venous return, easy flush, and tip of the catheter and   SVC 27.5 cm.      SPECIMEN:  None.      ESTIMATED BLOOD LOSS:  Minimal.      COMPLICATIONS:  None known.      PROCEDURE:  Pt was identified in the holding area and taken to   the operating room, where patient was placed supine on the operating room   table.  General anesthesia was induced.  Patient's arms were tucked and the upper   chest and neck were prepped and draped in sterile fashion.  Time-out was   performed according to the surgical safety check list.  When all was   correct, we continued.   Local anesthetic was administered over this   area at the angle of the clavicle.  The vein was accessed with 2 pass(es) of the needle. There was good venous return and the wire passed easily with no ectopy.   Fluoroscopy was used to confirm that the wire was in the vena cava.      The patient was placed back level and the area for the pocket was anethetized   with local anesthetic.  A 3-cm transverse incision was made with a #15   blade.  The prior port sutures were divided.  The pocket was still intact.  An [employer] retractor was used to elevate the skin  while a pocket was created on top of the pectoralis fascia.  The port   was placed into the pocket to confirm that it was of adequate size.  The   catheter was preattached to the port.  The port was then secured to the   pectoralis fascia with four 2-0 Prolene sutures.  These were clamped and   not tied down yet.  The old port was removed intact.    The catheter was tunneled through to the wire exit   site.  The catheter was placed along the wire to determine what length it should be to be in the SVC.  The  catheter was cut at 27.5 cm.  The tunneler sheath and dilator were passed over the wire and the dilator and wire were removed.  The catheter was advanced through the tunneler sheath and the tunneler sheath was pulled away.  Care was taken to keep the catheter in the tunneler sheath as this occurred. This was advanced and the tunneler sheath was removed.  There was good venous   return and easy flush of the catheter.  The Prolene sutures were tied   down to the pectoral fascia.  The skin was reapproximated using 3-0   Vicryl interrupted deep dermal sutures.    Fluoroscopy was used to re-confirm good position of the catheter.  The skin   was then closed using 4-0 Monocryl in a subcuticular fashion.  The port was flushed with concentrated heparin flush as well.  The wounds were then cleaned, dried, and dressed with Dermabond.  The patient was awakened from anesthesia and taken to the PACU in stable condition.  Needle, sponge, and instrument counts were correct.  Stark Klein, MD

## 2021-02-22 ENCOUNTER — Encounter (HOSPITAL_BASED_OUTPATIENT_CLINIC_OR_DEPARTMENT_OTHER): Payer: Self-pay | Admitting: General Surgery

## 2021-02-23 ENCOUNTER — Inpatient Hospital Stay: Payer: 59

## 2021-02-23 ENCOUNTER — Other Ambulatory Visit: Payer: Self-pay

## 2021-02-23 VITALS — BP 129/68 | HR 69 | Temp 98.4°F | Resp 16

## 2021-02-23 DIAGNOSIS — Z170 Estrogen receptor positive status [ER+]: Secondary | ICD-10-CM

## 2021-02-23 DIAGNOSIS — Z5111 Encounter for antineoplastic chemotherapy: Secondary | ICD-10-CM | POA: Diagnosis not present

## 2021-02-23 DIAGNOSIS — Z171 Estrogen receptor negative status [ER-]: Secondary | ICD-10-CM

## 2021-02-23 DIAGNOSIS — Z79899 Other long term (current) drug therapy: Secondary | ICD-10-CM | POA: Diagnosis not present

## 2021-02-23 DIAGNOSIS — C50511 Malignant neoplasm of lower-outer quadrant of right female breast: Secondary | ICD-10-CM

## 2021-02-23 DIAGNOSIS — M539 Dorsopathy, unspecified: Secondary | ICD-10-CM

## 2021-02-23 DIAGNOSIS — Z95828 Presence of other vascular implants and grafts: Secondary | ICD-10-CM

## 2021-02-23 DIAGNOSIS — Z5112 Encounter for antineoplastic immunotherapy: Secondary | ICD-10-CM | POA: Diagnosis not present

## 2021-02-23 DIAGNOSIS — C50812 Malignant neoplasm of overlapping sites of left female breast: Secondary | ICD-10-CM

## 2021-02-23 DIAGNOSIS — K760 Fatty (change of) liver, not elsewhere classified: Secondary | ICD-10-CM

## 2021-02-23 DIAGNOSIS — Z87891 Personal history of nicotine dependence: Secondary | ICD-10-CM | POA: Diagnosis not present

## 2021-02-23 LAB — COMPREHENSIVE METABOLIC PANEL
ALT: 20 U/L (ref 0–44)
AST: 15 U/L (ref 15–41)
Albumin: 3.3 g/dL — ABNORMAL LOW (ref 3.5–5.0)
Alkaline Phosphatase: 67 U/L (ref 38–126)
Anion gap: 7 (ref 5–15)
BUN: 14 mg/dL (ref 6–20)
CO2: 27 mmol/L (ref 22–32)
Calcium: 8.7 mg/dL — ABNORMAL LOW (ref 8.9–10.3)
Chloride: 109 mmol/L (ref 98–111)
Creatinine, Ser: 1.13 mg/dL — ABNORMAL HIGH (ref 0.44–1.00)
GFR, Estimated: 57 mL/min — ABNORMAL LOW (ref >60)
Glucose, Bld: 95 mg/dL (ref 70–99)
Potassium: 4.3 mmol/L (ref 3.5–5.1)
Sodium: 143 mmol/L (ref 135–145)
Total Bilirubin: 0.3 mg/dL (ref 0.3–1.2)
Total Protein: 6.5 g/dL (ref 6.5–8.1)

## 2021-02-23 LAB — CBC WITH DIFFERENTIAL/PLATELET
Abs Immature Granulocytes: 0.09 10*3/uL — ABNORMAL HIGH (ref 0.00–0.07)
Basophils Absolute: 0.1 10*3/uL (ref 0.0–0.1)
Basophils Relative: 1 %
Eosinophils Absolute: 0.2 10*3/uL (ref 0.0–0.5)
Eosinophils Relative: 4 %
HCT: 33.8 % — ABNORMAL LOW (ref 36.0–46.0)
Hemoglobin: 11.2 g/dL — ABNORMAL LOW (ref 12.0–15.0)
Immature Granulocytes: 2 %
Lymphocytes Relative: 48 %
Lymphs Abs: 2.6 10*3/uL (ref 0.7–4.0)
MCH: 29 pg (ref 26.0–34.0)
MCHC: 33.1 g/dL (ref 30.0–36.0)
MCV: 87.6 fL (ref 80.0–100.0)
Monocytes Absolute: 0.4 10*3/uL (ref 0.1–1.0)
Monocytes Relative: 8 %
Neutro Abs: 2 10*3/uL (ref 1.7–7.7)
Neutrophils Relative %: 37 %
Platelets: 157 10*3/uL (ref 150–400)
RBC: 3.86 MIL/uL — ABNORMAL LOW (ref 3.87–5.11)
RDW: 16.4 % — ABNORMAL HIGH (ref 11.5–15.5)
WBC: 5.3 10*3/uL (ref 4.0–10.5)
nRBC: 0 % (ref 0.0–0.2)

## 2021-02-23 LAB — TSH: TSH: 1.316 u[IU]/mL (ref 0.308–3.960)

## 2021-02-23 MED ORDER — HEPARIN SOD (PORK) LOCK FLUSH 100 UNIT/ML IV SOLN
500.0000 [IU] | Freq: Once | INTRAVENOUS | Status: AC | PRN
  Administered 2021-02-23: 500 [IU]
  Filled 2021-02-23: qty 5

## 2021-02-23 MED ORDER — DIPHENHYDRAMINE HCL 50 MG/ML IJ SOLN
INTRAMUSCULAR | Status: AC
  Filled 2021-02-23: qty 1

## 2021-02-23 MED ORDER — FAMOTIDINE 20 MG IN NS 100 ML IVPB
20.0000 mg | Freq: Once | INTRAVENOUS | Status: AC
  Administered 2021-02-23: 20 mg via INTRAVENOUS

## 2021-02-23 MED ORDER — ACETAMINOPHEN 325 MG PO TABS
ORAL_TABLET | ORAL | Status: AC
  Filled 2021-02-23: qty 2

## 2021-02-23 MED ORDER — SODIUM CHLORIDE 0.9 % IV SOLN
80.0000 mg/m2 | Freq: Once | INTRAVENOUS | Status: AC
  Administered 2021-02-23: 204 mg via INTRAVENOUS
  Filled 2021-02-23: qty 34

## 2021-02-23 MED ORDER — SODIUM CHLORIDE 0.9% FLUSH
10.0000 mL | INTRAVENOUS | Status: DC | PRN
  Administered 2021-02-23: 10 mL
  Filled 2021-02-23: qty 10

## 2021-02-23 MED ORDER — DIPHENHYDRAMINE HCL 25 MG PO CAPS
ORAL_CAPSULE | ORAL | Status: AC
  Filled 2021-02-23: qty 2

## 2021-02-23 MED ORDER — SODIUM CHLORIDE 0.9 % IV SOLN
Freq: Once | INTRAVENOUS | Status: AC
  Filled 2021-02-23: qty 250

## 2021-02-23 MED ORDER — METHYLPREDNISOLONE SODIUM SUCC 125 MG IJ SOLR
INTRAMUSCULAR | Status: AC
  Filled 2021-02-23: qty 2

## 2021-02-23 MED ORDER — FAMOTIDINE 20 MG IN NS 100 ML IVPB
INTRAVENOUS | Status: AC
  Filled 2021-02-23: qty 100

## 2021-02-23 MED ORDER — SODIUM CHLORIDE 0.9% FLUSH
10.0000 mL | INTRAVENOUS | Status: DC | PRN
  Administered 2021-02-23: 10 mL via INTRAVENOUS
  Filled 2021-02-23: qty 10

## 2021-02-23 MED ORDER — SODIUM CHLORIDE 0.9 % IV SOLN
10.0000 mg | Freq: Once | INTRAVENOUS | Status: AC
  Administered 2021-02-23: 10 mg via INTRAVENOUS
  Filled 2021-02-23: qty 10

## 2021-02-23 MED ORDER — DIPHENHYDRAMINE HCL 50 MG/ML IJ SOLN
25.0000 mg | Freq: Once | INTRAMUSCULAR | Status: AC
  Administered 2021-02-23: 25 mg via INTRAVENOUS

## 2021-02-23 NOTE — Patient Instructions (Signed)
[facility]COL[facility]   Discharge Instructions: Thank you for choosing [facility] to provide your oncology and hematology care.   If you have a lab appointment with the Cancer Center, please go directly to the Cancer Center and check in at the registration area.   Wear comfortable clothing and clothing appropriate for easy access to any Portacath or PICC line.   We strive to give you quality time with your provider. You may need to reschedule your appointment if you arrive late (15 or more minutes).  Arriving late affects you and other patients whose appointments are after yours.  Also, if you miss three or more appointments without notifying the office, you may be dismissed from the clinic at the provider's discretion.      For prescription refill requests, have your pharmacy contact our office and allow 72 hours for refills to be completed.    Today you received the following chemotherapy and/or immunotherapy agents: paclitaxel.      To help prevent nausea and vomiting after your treatment, we encourage you to take your nausea medication as directed.  BELOW ARE SYMPTOMS THAT SHOULD BE REPORTED IMMEDIATELY: *FEVER GREATER THAN 100.4 F (38 C) OR HIGHER *CHILLS OR SWEATING *NAUSEA AND VOMITING THAT IS NOT CONTROLLED WITH YOUR NAUSEA MEDICATION *UNUSUAL SHORTNESS OF BREATH *UNUSUAL BRUISING OR BLEEDING *URINARY PROBLEMS (pain or burning when urinating, or frequent urination) *BOWEL PROBLEMS (unusual diarrhea, constipation, pain near the anus) TENDERNESS IN MOUTH AND THROAT WITH OR WITHOUT PRESENCE OF ULCERS (sore throat, sores in mouth, or a toothache) UNUSUAL RASH, SWELLING OR PAIN  UNUSUAL VAGINAL DISCHARGE OR ITCHING   Items with * indicate a potential emergency and should be followed up as soon as possible or go to the Emergency Department if any problems should occur.  Please show the CHEMOTHERAPY ALERT CARD or IMMUNOTHERAPY ALERT CARD at check-in  to the Emergency Department and triage nurse.  Should you have questions after your visit or need to cancel or reschedule your appointment, please contact [facility]  Dept: [phone]  and follow the prompts.  Office hours are 8:00 a.m. to 4:30 p.m. Monday - Friday. Please note that voicemails left after 4:00 p.m. may not be returned until the following business day.  We are closed weekends and major holidays. You have access to a nurse at all times for urgent questions. Please call the main number to the clinic Dept: [phone] and follow the prompts.   For any non-urgent questions, you may also contact your provider using MyChart. We now offer e-Visits for anyone 18 and older to request care online for non-urgent symptoms. For details visit [email or web address].   Also download the MyChart app! Go to the app store, search "MyChart", open the app, select [facility], and log in with your MyChart username and password.  Due to Covid, a mask is required upon entering the hospital/clinic. If you do not have a mask, one will be given to you upon arrival. For doctor visits, patients may have 1 support person aged 18 or older with them. For treatment visits, patients cannot have anyone with them due to current Covid guidelines and our immunocompromised population.   

## 2021-02-24 ENCOUNTER — Ambulatory Visit: Payer: 59

## 2021-02-24 ENCOUNTER — Encounter: Payer: Self-pay | Admitting: Oncology

## 2021-02-24 ENCOUNTER — Other Ambulatory Visit: Payer: 59

## 2021-02-24 NOTE — Progress Notes (Signed)
Patient called to inquire about [employer].  Based on verbal income guidelines provided, patient exceeds the guidelines.  Discussed available copay for specific treatment drug(Keytruda) if she has not met her ded/OOP. Patient states she has not met OOP.  Will provide her with paper application on 6/2 as well as have provider complete his portion.  She has my card for any additional financial questions or concerns.

## 2021-03-01 ENCOUNTER — Other Ambulatory Visit: Payer: Self-pay

## 2021-03-01 ENCOUNTER — Ambulatory Visit: Payer: 59 | Attending: Oncology | Admitting: Physical Therapy

## 2021-03-01 ENCOUNTER — Encounter: Payer: Self-pay | Admitting: Physical Therapy

## 2021-03-01 DIAGNOSIS — C50511 Malignant neoplasm of lower-outer quadrant of right female breast: Secondary | ICD-10-CM | POA: Insufficient documentation

## 2021-03-01 DIAGNOSIS — Z171 Estrogen receptor negative status [ER-]: Secondary | ICD-10-CM | POA: Insufficient documentation

## 2021-03-01 DIAGNOSIS — R293 Abnormal posture: Secondary | ICD-10-CM | POA: Insufficient documentation

## 2021-03-01 DIAGNOSIS — C50512 Malignant neoplasm of lower-outer quadrant of left female breast: Secondary | ICD-10-CM | POA: Insufficient documentation

## 2021-03-01 DIAGNOSIS — Z170 Estrogen receptor positive status [ER+]: Secondary | ICD-10-CM | POA: Diagnosis not present

## 2021-03-01 NOTE — Assessment & Plan Note (Signed)
palpable left breast lump. She underwent bilateral diagnostic mammography with tomography and bilateral breast ultrasonography at [facility] on 01/12/2021 showing: breast density category C; 5.8 cm mass in the left breast at 12:30 with associated calcifications; additional 1.2 cm group of calcifications in the outer left breast;   On the right: Multiple small masses and irregular ducts in the retroareolar right breast, including a 0.8 cm representative mass at 6 o'clock; indeterminate 0.4 cm mass in upper-inner right breast; normal lymph nodes bilaterally.  Accordingly on 01/16/2021 she proceeded to biopsy of the right breast areas in question. The pathology from this procedure ([id number]) showed:  1. Right Breast, 6 o'clock             - invasive ductal carcinoma, grade 2. Prognostic indicators significant for: estrogen receptor, 90% positive with strong staining intensity and progesterone receptor, 0% negative. Proliferation marker Ki67 at 5%. HER2 equivocal by immunohistochemistry (2+), but negative by fluorescent in situ hybridization with a signals ratio 1.61 and number per cell 2.5. 2. Right Breast, 9 o'clock             - fibrocystic change with adenosis, calcifications, and a small intraductal papilloma.  She underwent biopsy of the left breast areas on 01/20/2021. Pathology [id number]) revealed: 1. Left Breast, upper-outer quadrant             - invasive ductal carcinoma, grade 3 2. Left Breast, central, slightly lateral to midline             - ductal carcinoma in situ with necrosis and calcifications, intermediate grade Prognostic panels are pending on both samples. -------------------------------------------------------------------------------------------------------------------------------------------- Current Treatment: Cycle 3 Taxol (carbo and Pembro q 3 weeks) Toxicities:  RTC weekly for Taxol and next week

## 2021-03-01 NOTE — Progress Notes (Signed)
Patient Care Team: Fanny Bien, MD as PCP - General (Family Medicine) Herminio Commons, MD (Inactive) as PCP - Cardiology (Cardiology) Mauro Kaufmann, RN as Oncology Nurse Navigator Rockwell Germany, RN as Oncology Nurse Navigator Stark Klein, MD as Consulting Physician (General Surgery) Kyung Rudd, MD as Consulting Physician (Radiation Oncology)  DIAGNOSIS:    ICD-10-CM   1. Malignant neoplasm of overlapping sites of left breast in female, estrogen receptor negative ([place])  C50.812    Z17.1     SUMMARY OF ONCOLOGIC HISTORY: Oncology History  Malignant neoplasm of lower-outer quadrant of right breast of female, estrogen receptor positive ([place])  01/20/2021 Cancer Staging   Staging form: Breast, AJCC 8th Edition - Clinical stage from 01/20/2021: Stage IA (cT1c, cN0, cM0, G2, ER+, PR-, HER2-) - Signed by Chauncey Cruel, MD on 01/25/2021 Stage prefix: Initial diagnosis Histologic grading system: 3 grade system   01/23/2021 Initial Diagnosis   Malignant neoplasm of lower-outer quadrant of right breast of female, estrogen receptor positive ([place])   02/10/2021 -  Chemotherapy    Patient is on Treatment Plan: BREAST PEMBROLIZUMAB + CARBOPLATIN D1 + PACLITAXEL D1,8,15 Q21D X 4 CYCLES / PEMBROLIZUMAB + AC Q21D X 4 CYCLES       Genetic Testing   Negative genetic testing. No pathogenic variants identified on the [facility] Multi-Cancer Panel+RNA. VUS in ATM called c.1132A>G identified. The report date is 02/13/2021.  The Multi-Cancer Panel + RNA offered by [facility] includes sequencing and/or deletion duplication testing of the following 84 genes: AIP, ALK, APC, ATM, AXIN2,BAP1,  BARD1, BLM, BMPR1A, BRCA1, BRCA2, BRIP1, CASR, CDC73, CDH1, CDK4, CDKN1B, CDKN1C, CDKN2A (p14ARF), CDKN2A (p16INK4a), CEBPA, CHEK2, CTNNA1, DICER1, DIS3L2, EGFR (c.2369C>T, p.Thr790Met variant only), EPCAM (Deletion/duplication testing only), FH, FLCN, GATA2, GPC3, GREM1 (Promoter region deletion/duplication  testing only), HOXB13 (c.251G>A, p.Gly84Glu), HRAS, KIT, MAX, MEN1, MET, MITF (c.952G>A, p.Glu318Lys variant only), MLH1, MSH2, MSH3, MSH6, MUTYH, NBN, NF1, NF2, NTHL1, PALB2, PDGFRA, PHOX2B, PMS2, POLD1, POLE, POT1, PRKAR1A, PTCH1, PTEN, RAD50, RAD51C, RAD51D, RB1, RECQL4, RET, RUNX1, SDHAF2, SDHA (sequence changes only), SDHB, SDHC, SDHD, SMAD4, SMARCA4, SMARCB1, SMARCE1, STK11, SUFU, TERC, TERT, TMEM127, TP53, TSC1, TSC2, VHL, WRN and WT1.   Malignant neoplasm of overlapping sites of left breast in female, estrogen receptor negative ([place])  01/20/2021 Cancer Staging   Staging form: Breast, AJCC 8th Edition - Clinical stage from 01/20/2021: Stage IIIB (cT3, cN0, cM0, G3, ER-, PR-, HER2-) - Signed by Chauncey Cruel, MD on 01/25/2021 Histologic grading system: 3 grade system   01/25/2021 Initial Diagnosis   Malignant neoplasm of overlapping sites of left breast in female, estrogen receptor negative ([place])   02/10/2021 -  Chemotherapy    Patient is on Treatment Plan: BREAST PEMBROLIZUMAB + CARBOPLATIN D1 + PACLITAXEL D1,8,15 Q21D X 4 CYCLES / PEMBROLIZUMAB + AC Q21D X 4 CYCLES       Genetic Testing   Negative genetic testing. No pathogenic variants identified on the [facility] Multi-Cancer Panel+RNA. VUS in ATM called c.1132A>G identified. The report date is 02/13/2021.  The Multi-Cancer Panel + RNA offered by [facility] includes sequencing and/or deletion duplication testing of the following 84 genes: AIP, ALK, APC, ATM, AXIN2,BAP1,  BARD1, BLM, BMPR1A, BRCA1, BRCA2, BRIP1, CASR, CDC73, CDH1, CDK4, CDKN1B, CDKN1C, CDKN2A (p14ARF), CDKN2A (p16INK4a), CEBPA, CHEK2, CTNNA1, DICER1, DIS3L2, EGFR (c.2369C>T, p.Thr790Met variant only), EPCAM (Deletion/duplication testing only), FH, FLCN, GATA2, GPC3, GREM1 (Promoter region deletion/duplication testing only), HOXB13 (c.251G>A, p.Gly84Glu), HRAS, KIT, MAX, MEN1, MET, MITF (c.952G>A, p.Glu318Lys variant only), MLH1, MSH2, MSH3,  MSH6, MUTYH, NBN, NF1, NF2, NTHL1,  PALB2, PDGFRA, PHOX2B, PMS2, POLD1, POLE, POT1, PRKAR1A, PTCH1, PTEN, RAD50, RAD51C, RAD51D, RB1, RECQL4, RET, RUNX1, SDHAF2, SDHA (sequence changes only), SDHB, SDHC, SDHD, SMAD4, SMARCA4, SMARCB1, SMARCE1, STK11, SUFU, TERC, TERT, TMEM127, TP53, TSC1, TSC2, VHL, WRN and WT1.     CHIEF COMPLIANT: Cycle 4 Taxol, Carboplatin, and Keytruda  INTERVAL HISTORY: Leslie Wright is a 55 y.o. with above-mentioned history of bilateral breast cancers. Her port was placed by Dr. Barry Dienes on 02/08/21 and fixed due to poor position on 02/21/21. She is currently on neoadjuvant chemotherapy with Taxol, Carboplatin, and Keytruda. She is a patient of Dr. Virgie Dad and is switching to my care. She presents to the clinic today for treatment.   ALLERGIES:  has No Known Allergies.  MEDICATIONS:  Current Outpatient Medications  Medication Sig Dispense Refill  . acetaminophen (TYLENOL) 500 MG tablet Take 1,000 mg by mouth every 6 (six) hours as needed for moderate pain.    Marland Kitchen aspirin EC 81 MG tablet Take 81 mg by mouth daily.    . Blood Glucose Monitoring Suppl (FREESTYLE LITE) w/Device KIT CHECK GLUCOSE 2-3 TIMES DAILY 1 kit 0  . Cholecalciferol (VITAMIN D) 50 MCG (2000 UT) CAPS Take 2,000 Units by mouth daily.    . ciprofloxacin (CIPRO) 500 MG tablet Take 1 tablet by mouth twice a day for 3 days for bladder infection 6 tablet 0  . dexamethasone (DECADRON) 4 MG tablet Take 2 tablets once a day for 3 days after carboplatin and AC chemotherapy. Take with food. 30 tablet 1  . ELDERBERRY PO Take 1 capsule by mouth daily.    . folic acid (FOLVITE) 1 MG tablet Take 1 mg by mouth daily.    Marland Kitchen glucose blood test strip USE AS DIRECTED TO CHECK BLOOD SUGAR 1 TO 3 TIMES A DAY (Patient taking differently: USE AS DIRECTED TO CHECK BLOOD SUGAR 1 TO 3 TIMES A DAY) 100 strip 3  . ketoconazole (NIZORAL) 2 % cream Apply 1 application topically daily. (Patient taking differently: Apply 1 application topically daily as needed.) 15 g 0  .  Lancets (FREESTYLE) lancets USE AS DIRECTED TO CHECK BLOOD SUGAR 2 TO 3 TIMES A DAY (Patient taking differently: USE AS DIRECTED TO CHECK BLOOD SUGAR 2 TO 3 TIMES A DAY) 100 each 3  . lidocaine-prilocaine (EMLA) cream Apply to affected area once (Patient not taking: Reported on 02/20/2021) 30 g 3  . losartan (COZAAR) 100 MG tablet Take 1 tablet by mouth daily 90 tablet 3  . methotrexate (RHEUMATREX) 15 MG tablet Take 15 mg by mouth every Wednesday. Caution: Chemotherapy. Protect from light. (Patient not taking: Reported on 02/21/2021)    . Multiple Vitamin (MULTIVITAMIN WITH MINERALS) TABS tablet Take 1 tablet by mouth daily.    Marland Kitchen omeprazole (PRILOSEC) 20 MG capsule Take 20 mg by mouth daily.    . ondansetron (ZOFRAN) 8 MG tablet Take 1 tablet (8 mg total) by mouth 2 (two) times daily as needed. Start on the third day after carboplatin and AC chemotherapy. 30 tablet 1  . oxyCODONE (OXY IR/ROXICODONE) 5 MG immediate release tablet Take 1 tablet (5 mg total) by mouth every 6 (six) hours as needed for severe pain. (Patient not taking: Reported on 02/21/2021) 8 tablet 0  . prochlorperazine (COMPAZINE) 10 MG tablet Take 1 tablet (10 mg total) by mouth every 6 (six) hours as needed (Nausea or vomiting). 30 tablet 1  . rosuvastatin (CRESTOR) 10 MG tablet TAKE 1 TABLET  BY MOUTH ONCE A DAY AT BEDTIME FOR CHOLESTEROL (Patient taking differently: Take 10 mg by mouth at bedtime.) 90 tablet 1   No current facility-administered medications for this visit.    PHYSICAL EXAMINATION: ECOG PERFORMANCE STATUS: 1 - Symptomatic but completely ambulatory  There were no vitals filed for this visit. There were no vitals filed for this visit.  LABORATORY DATA:  I have reviewed the data as listed CMP Latest Ref Rng & Units 02/23/2021 02/17/2021 02/09/2021  Glucose 70 - 99 mg/dL 95 116(H) 140(H)  BUN 6 - 20 mg/dL 14 13 23(H)  Creatinine 0.44 - 1.00 mg/dL 1.13(H) 1.17(H) 1.44(H)  Sodium 135 - 145 mmol/L 143 139 143   Potassium 3.5 - 5.1 mmol/L 4.3 4.0 5.2(H)  Chloride 98 - 111 mmol/L 109 104 105  CO2 22 - 32 mmol/L 27 26 27   Calcium 8.9 - 10.3 mg/dL 8.7(L) 9.0 9.6  Total Protein 6.5 - 8.1 g/dL 6.5 6.6 7.0  Total Bilirubin 0.3 - 1.2 mg/dL 0.3 0.4 <0.2(L)  Alkaline Phos 38 - 126 U/L 67 69 71  AST 15 - 41 U/L 15 17 13(L)  ALT 0 - 44 U/L 20 19 15     Lab Results  Component Value Date   WBC 5.3 02/23/2021   HGB 11.2 (L) 02/23/2021   HCT 33.8 (L) 02/23/2021   MCV 87.6 02/23/2021   PLT 157 02/23/2021   NEUTROABS 2.0 02/23/2021    ASSESSMENT & PLAN:  Malignant neoplasm of overlapping sites of left breast in female, estrogen receptor negative (HCC) palpable left breast lump. She underwent bilateral diagnostic mammography with tomography and bilateral breast ultrasonography at [facility] on 01/12/2021 showing: breast density category C; 5.8 cm mass in the left breast at 12:30 with associated calcifications; additional 1.2 cm group of calcifications in the outer left breast;   On the right: Multiple small masses and irregular ducts in the retroareolar right breast, including a 0.8 cm representative mass at 6 o'clock; indeterminate 0.4 cm mass in upper-inner right breast; normal lymph nodes bilaterally.  Accordingly on 01/16/2021 she proceeded to biopsy of the right breast areas in question. The pathology from this procedure ([id number]) showed:  1. Right Breast, 6 o'clock             - invasive ductal carcinoma, grade 2. Prognostic indicators significant for: estrogen receptor, 90% positive with strong staining intensity and progesterone receptor, 0% negative. Proliferation marker Ki67 at 5%. HER2 equivocal by immunohistochemistry (2+), but negative by fluorescent in situ hybridization with a signals ratio 1.61 and number per cell 2.5. 2. Right Breast, 9 o'clock             - fibrocystic change with adenosis, calcifications, and a small intraductal papilloma.  She underwent biopsy of the left  breast areas on 01/20/2021. Pathology [id number]) revealed: 1. Left Breast, upper-outer quadrant             - invasive ductal carcinoma, grade 3 2. Left Breast, central, slightly lateral to midline             - ductal carcinoma in situ with necrosis and calcifications, intermediate grade Prognostic panels are pending on both samples. -------------------------------------------------------------------------------------------------------------------------------------------- Current Treatment: Cycle 3 Taxol (carbo and Pembro q 3 weeks) Toxicities: 1.  Maculopapular rash on the face that started 2 days ago.  I reviewed the NCCN immunotherapy guidelines and this will be characterized as mild symptoms.  Based on the guidelines she can continue with immunotherapy but I sent a prescription  for topical betamethasone ointment that she can apply along with emollients like Vaseline to prevent the skin from getting any further damage.    RTC weekly for Taxol and next week I would like to see her to reassess her skin issues. 2. mild nausea Denies any peripheral neuropathy.   No orders of the defined types were placed in this encounter.  The patient has a good understanding of the overall plan. she agrees with it. she will call with any problems that may develop before the next visit here.  Total time spent: 30 mins including face to face time and time spent for planning, charting and coordination of care  Rulon Eisenmenger, MD, MPH 03/02/2021  I, Cloyde Reams Dorshimer, am acting as scribe for Dr. Nicholas Lose.  I have reviewed the above documentation for accuracy and completeness, and I agree with the above.

## 2021-03-01 NOTE — Therapy (Signed)
[place], [place], [address] Phone: [phone]   Fax:  [phone]  Physical Therapy Evaluation  Patient Details  Name: Leslie Wright MRN: [id number] Date of Birth: 18-Jun-1966 Referring Provider (PT): Magrinat   Encounter Date: 03/01/2021   PT End of Session - 03/01/21 1051    Visit Number 1    Number of Visits 2    Date for PT Re-Evaluation 08/31/21    PT Start Time 1007    PT Stop Time 1049    PT Time Calculation (min) 42 min    Activity Tolerance Patient tolerated treatment well    Behavior During Therapy [facility] for tasks assessed/performed           Past Medical History:  Diagnosis Date  . Acid reflux   . Anemia   . Arthritis    "maybe in my fingers" (03/26/2018)  . Breast cancer ([place]) 12/2020   both sides  . Concussion 2001   no deficit had tia right after no problems since  . Family history of adverse reaction to anesthesia    "brother, Otila Kluver, and my mom always get sick" (03/26/2018)  . Family history of brain cancer   . Family history of breast cancer   . Family history of colon cancer   . Family history of lung cancer   . Family history of pancreatic cancer   . Family history of prostate cancer   . Headache   . High blood pressure   . High cholesterol   . Hip pain    "left; before OR" (03/26/2018)  . History of chemotherapy last done 02-17-2021  . History of hiatal hernia   . History of kidney stones   . OSA (obstructive sleep apnea)    "can't tolerate the mask" (03/26/2018)   . PONV (postoperative nausea and vomiting)    does not need much anesthesia, slow to awlaekn in past  . Pre-diabetes   . Shortness of breath    with exertion  . Wears partial dentures upper    Past Surgical History:  Procedure Laterality Date  . CARPAL TUNNEL RELEASE Right ~ 2006  . JOINT REPLACEMENT    . LAPAROSCOPIC CHOLECYSTECTOMY  12/2010  . LASIK Bilateral 2000  . PORT A CATH REVISION Left  02/21/2021   Procedure: PORT A CATH REVISION;  Surgeon: Stark Klein, MD;  Location: [facility];  Service: General;  Laterality: Left;  60 MINUTES ROOM 5  . PORTACATH PLACEMENT N/A 02/08/2021   Procedure: INSERTION PORT-A-CATH;  Surgeon: Stark Klein, MD;  Location: [facility];  Service: General;  Laterality: N/A;  . TOTAL HIP ARTHROPLASTY Left 03/25/2018   Procedure: LEFT TOTAL HIP ARTHROPLASTY ANTERIOR APPROACH;  Surgeon: Mcarthur Rossetti, MD;  Location: [facility];  Service: Orthopedics;  Laterality: Left;    There were no vitals filed for this visit.    Subjective Assessment - 03/01/21 1010    Subjective I have bilateral breast cancer. I do my chemo on Thursday and by Sunday I was dragging. I am still working. I will be done with chemo in the first week of October. Surgery will be sometime in October.    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 No/denies  Pain Score 0-No pain              [facility] PT Assessment - 03/01/21 0001      Assessment   Medical Diagnosis bilateral breast cancer    Referring Provider (PT) Magrinat    Onset Date/Surgical Date 01/12/21   day diagnosed   Hand Dominance Right    Prior Therapy for hip replacement in July 2019      Precautions   Precautions Anterior Hip   L hip     Restrictions   Weight Bearing Restrictions No      Balance Screen   Has the patient fallen in the past 6 months No    Has the patient had a decrease in activity level because of a fear of falling?  No    Is the patient reluctant to leave their home because of a fear of falling?  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    Leisure pt does not currently exercise      Cognition   Overall Cognitive Status Within Functional Limits for tasks assessed      Functional Tests   Functional tests Sit to Stand      Sit to Stand   Comments 30 sec sit to stand: 9 reps      Posture/Postural Control   Posture/Postural Control Postural limitations    Postural Limitations Rounded Shoulders;Forward head      AROM   Right Shoulder Extension 40 Degrees    Right Shoulder Flexion 153 Degrees    Right Shoulder ABduction 172 Degrees    Right Shoulder Internal Rotation 63 Degrees    Right Shoulder External Rotation 78 Degrees    Left Shoulder Extension 62 Degrees    Left Shoulder Flexion 155 Degrees    Left Shoulder ABduction 170 Degrees    Left Shoulder Internal Rotation 71 Degrees    Left Shoulder External Rotation 77 Degrees             LYMPHEDEMA/ONCOLOGY QUESTIONNAIRE - 03/01/21 0001      Type   Cancer Type bilateral breast cancer      Treatment   Active Chemotherapy Treatment Yes      Lymphedema Assessments   Lymphedema Assessments Upper extremities      Right Upper Extremity Lymphedema   15 cm Proximal to Olecranon Process 42 cm    Olecranon Process 32 cm    15 cm Proximal to Ulnar Styloid Process 29.6 cm    Just Proximal to Ulnar Styloid Process 19 cm    Across Hand at [employer] 21 cm    At [place] of 2nd Digit 6.8 cm      Left Upper Extremity Lymphedema   15 cm Proximal to Olecranon Process 42 cm    Olecranon Process 29.7 cm    15 cm Proximal to Ulnar Styloid Process 28.8 cm    Just Proximal to Ulnar Styloid Process 18.9 cm    Across Hand at [employer] 21 cm    At [place] of 2nd Digit 7 cm  L-DEX FLOWSHEETS - 03/01/21 1000      L-DEX LYMPHEDEMA  SCREENING   Measurement Type Bilateral    L-DEX MEASUREMENT EXTREMITY Upper Extremity    POSITION  Standing    DOMINANT SIDE Right    At Risk Side --   bilateral   BASELINE RIGHT -2.3    BASELINE LEFT -1.9           The patient was assessed using the L-Dex machine today to produce a lymphedema index baseline score. The patient will be reassessed on a regular basis (typically every 3 months) to obtain new L-Dex scores. If the score is > 6.5 points away from his/her baseline score indicating onset of subclinical lymphedema, it will be recommended to wear a compression garment for 4 weeks, 12 hours per day and then be reassessed. If the score continues to be > 6.5 points from baseline at reassessment, we will initiate lymphedema treatment. Assessing in this manner has a 95% rate of preventing clinically significant lymphedema.        Objective measurements completed on examination: See above findings.               PT Education - 03/01/21 1051    Education Details ABC class, post op shoulder ROM exercises, anatomy and physiology of lymphatic system, signs and symptoms of lymphedema, SOZO, lymphedema risk reduction practices    Person(s) Educated Patient    Methods Explanation;Handout    Comprehension Verbalized understanding               PT Long Term Goals - 03/01/21 1056      PT LONG TERM GOAL #1   Title Pt will return to baseline shoulder ROM measurements and not demonstrate any signs or symptoms of lymphedema    Time 6    Period Months    Status New    Target Date 08/31/21           Breast Clinic Goals - 03/01/21 1056      Patient will be able to verbalize understanding of pertinent lymphedema risk reduction practices relevant to her diagnosis specifically related to skin care.   Time 1    Period Days    Status Achieved      Patient will be able to return demonstrate and/or verbalize understanding of the post-op home exercise program related to  regaining shoulder range of motion.   Time 1    Period Days    Status Achieved      Patient will be able to verbalize understanding of the importance of attending the postoperative After Breast Cancer Class for further lymphedema risk reduction education and therapeutic exercise.   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 She is currently undergoing neoadjuvant chemo and should be having surgery (not sure if lumpectomies or mastectomies) in October 2022 and then will undergo radiation. Pt was instructed in post op exercises and educated that if she has a mastectomy then not to begin exercises until a week after the last drain is removed. Baseline ROM and SOZO measurements taken today. She will benefit from a post op PT reassessment to determine needs and in every 3 months for additional  L dex screening to detect subclinical lymphedema.    Stability/Clinical Decision Making Stable/Uncomplicated    Clinical Decision Making Low    Rehab Potential Good    PT Frequency --   eval and 1 f/u visit   PT Duration Other (comment)   6 months   PT Treatment/Interventions ADLs/Self Care Home Management;Patient/family education;Therapeutic exercise    PT Next Visit Plan reassess baselines, be sure pt is signed up for ABC class    PT Home Exercise Plan post op breast exercises    Consulted and Agree with Plan of Care Patient           Patient will benefit from skilled therapeutic intervention in order to improve the following deficits and impairments:  Postural dysfunction,Decreased knowledge of precautions  Visit Diagnosis: Abnormal posture  Malignant neoplasm of lower-outer quadrant of right breast of female, estrogen receptor positive ([place])  Malignant neoplasm of lower-outer  quadrant of left breast of female, estrogen receptor negative ([place])   Patient will follow up at outpatient cancer rehab 3-4 weeks following surgery.  If the patient requires physical therapy at that time, a specific plan will be dictated and sent to the referring physician for approval. The patient was educated today on appropriate basic range of motion exercises to begin post operatively and the importance of attending the After Breast Cancer class following surgery.  Patient was educated today on lymphedema risk reduction practices as it pertains to recommendations that will benefit the patient immediately following surgery.  She verbalized good understanding.      Problem List Patient Active Problem List   Diagnosis Date Noted  . Encounter for antineoplastic chemotherapy 02/17/2021  . Encounter for immunotherapy 02/17/2021  . Genetic testing 02/14/2021  . Hepatic steatosis 02/09/2021  . Multilevel degenerative disc disease 02/09/2021  . Family history of breast cancer   . Family history of pancreatic cancer   . Family history of colon cancer   . Family history of lung cancer   . Family history of prostate cancer   . Family history of brain cancer   . Malignant neoplasm of overlapping sites of left breast in female, estrogen receptor negative ([place]) 01/25/2021  . Malignant neoplasm of lower-outer quadrant of right breast of female, estrogen receptor positive ([place]) 01/23/2021  . Status post left hip replacement 05/19/2018  . Acute deep vein thrombosis (DVT) of distal end of right lower extremity ([place]) 05/19/2018  . Unilateral primary osteoarthritis, left hip 03/25/2018  . Status post total replacement of left hip 03/25/2018  . Hyperlipidemia 04/11/2017  . Class 3 obesity with serious comorbidity and body mass index (BMI) of 40.0 to 44.9 in adult 04/11/2017  . Prediabetes 02/04/2017  . Mixed hyperlipidemia 01/09/2017  . Insulin resistance 11/27/2016  . Essential hypertension 10/25/2016   . Morbid obesity ([place]) 10/25/2016  . Vitamin D deficiency 10/25/2016    Allyson Sabal [facility] 03/01/2021, 10:57 AM  [place], [place], [address] Phone: [phone]   Fax:  [phone]  Name: Leslie Wright MRN: [id number] Date of Birth: 05-Sep-1966  Manus Gunning, PT 03/01/21 10:57 AM

## 2021-03-02 ENCOUNTER — Inpatient Hospital Stay: Payer: 59

## 2021-03-02 ENCOUNTER — Ambulatory Visit: Payer: 59 | Admitting: Adult Health

## 2021-03-02 ENCOUNTER — Other Ambulatory Visit (HOSPITAL_COMMUNITY): Payer: Self-pay

## 2021-03-02 ENCOUNTER — Encounter: Payer: Self-pay | Admitting: Oncology

## 2021-03-02 ENCOUNTER — Inpatient Hospital Stay: Payer: 59 | Attending: Oncology

## 2021-03-02 ENCOUNTER — Inpatient Hospital Stay: Payer: 59 | Admitting: Hematology and Oncology

## 2021-03-02 DIAGNOSIS — Z79899 Other long term (current) drug therapy: Secondary | ICD-10-CM | POA: Insufficient documentation

## 2021-03-02 DIAGNOSIS — Z5111 Encounter for antineoplastic chemotherapy: Secondary | ICD-10-CM | POA: Insufficient documentation

## 2021-03-02 DIAGNOSIS — Z5112 Encounter for antineoplastic immunotherapy: Secondary | ICD-10-CM | POA: Diagnosis not present

## 2021-03-02 DIAGNOSIS — Z171 Estrogen receptor negative status [ER-]: Secondary | ICD-10-CM | POA: Diagnosis not present

## 2021-03-02 DIAGNOSIS — C50812 Malignant neoplasm of overlapping sites of left female breast: Secondary | ICD-10-CM | POA: Diagnosis not present

## 2021-03-02 DIAGNOSIS — C50511 Malignant neoplasm of lower-outer quadrant of right female breast: Secondary | ICD-10-CM

## 2021-03-02 DIAGNOSIS — Z5189 Encounter for other specified aftercare: Secondary | ICD-10-CM | POA: Insufficient documentation

## 2021-03-02 DIAGNOSIS — K760 Fatty (change of) liver, not elsewhere classified: Secondary | ICD-10-CM

## 2021-03-02 DIAGNOSIS — Z170 Estrogen receptor positive status [ER+]: Secondary | ICD-10-CM | POA: Diagnosis not present

## 2021-03-02 DIAGNOSIS — Z95828 Presence of other vascular implants and grafts: Secondary | ICD-10-CM

## 2021-03-02 DIAGNOSIS — M539 Dorsopathy, unspecified: Secondary | ICD-10-CM

## 2021-03-02 LAB — COMPREHENSIVE METABOLIC PANEL
ALT: 27 U/L (ref 0–44)
AST: 21 U/L (ref 15–41)
Albumin: 3.6 g/dL (ref 3.5–5.0)
Alkaline Phosphatase: 82 U/L (ref 38–126)
Anion gap: 13 (ref 5–15)
BUN: 17 mg/dL (ref 6–20)
CO2: 23 mmol/L (ref 22–32)
Calcium: 10 mg/dL (ref 8.9–10.3)
Chloride: 106 mmol/L (ref 98–111)
Creatinine, Ser: 1.42 mg/dL — ABNORMAL HIGH (ref 0.44–1.00)
GFR, Estimated: 43 mL/min — ABNORMAL LOW (ref >60)
Glucose, Bld: 108 mg/dL — ABNORMAL HIGH (ref 70–99)
Potassium: 3.8 mmol/L (ref 3.5–5.1)
Sodium: 142 mmol/L (ref 135–145)
Total Bilirubin: 0.4 mg/dL (ref 0.3–1.2)
Total Protein: 7.3 g/dL (ref 6.5–8.1)

## 2021-03-02 LAB — CBC WITH DIFFERENTIAL/PLATELET
Abs Immature Granulocytes: 0.3 10*3/uL — ABNORMAL HIGH (ref 0.00–0.07)
Basophils Absolute: 0.1 10*3/uL (ref 0.0–0.1)
Basophils Relative: 1 %
Eosinophils Absolute: 0.1 10*3/uL (ref 0.0–0.5)
Eosinophils Relative: 2 %
HCT: 38.1 % (ref 36.0–46.0)
Hemoglobin: 12.6 g/dL (ref 12.0–15.0)
Immature Granulocytes: 4 %
Lymphocytes Relative: 30 %
Lymphs Abs: 2.2 10*3/uL (ref 0.7–4.0)
MCH: 28.7 pg (ref 26.0–34.0)
MCHC: 33.1 g/dL (ref 30.0–36.0)
MCV: 86.8 fL (ref 80.0–100.0)
Monocytes Absolute: 0.6 10*3/uL (ref 0.1–1.0)
Monocytes Relative: 8 %
Neutro Abs: 4 10*3/uL (ref 1.7–7.7)
Neutrophils Relative %: 55 %
Platelets: 204 10*3/uL (ref 150–400)
RBC: 4.39 MIL/uL (ref 3.87–5.11)
RDW: 16.9 % — ABNORMAL HIGH (ref 11.5–15.5)
WBC: 7.3 10*3/uL (ref 4.0–10.5)
nRBC: 0.4 % — ABNORMAL HIGH (ref 0.0–0.2)

## 2021-03-02 LAB — TSH: TSH: 1.527 u[IU]/mL (ref 0.308–3.960)

## 2021-03-02 MED ORDER — SODIUM CHLORIDE 0.9 % IV SOLN
610.0000 mg | Freq: Once | INTRAVENOUS | Status: AC
  Administered 2021-03-02: 610 mg via INTRAVENOUS
  Filled 2021-03-02: qty 61

## 2021-03-02 MED ORDER — SODIUM CHLORIDE 0.9 % IV SOLN
200.0000 mg | Freq: Once | INTRAVENOUS | Status: AC
  Administered 2021-03-02: 200 mg via INTRAVENOUS
  Filled 2021-03-02: qty 8

## 2021-03-02 MED ORDER — SODIUM CHLORIDE 0.9% FLUSH
10.0000 mL | Freq: Once | INTRAVENOUS | Status: AC
  Administered 2021-03-02: 10 mL via INTRAVENOUS
  Filled 2021-03-02: qty 10

## 2021-03-02 MED ORDER — DIPHENHYDRAMINE HCL 50 MG/ML IJ SOLN
INTRAMUSCULAR | Status: AC
  Filled 2021-03-02: qty 1

## 2021-03-02 MED ORDER — FAMOTIDINE 20 MG IN NS 100 ML IVPB
20.0000 mg | Freq: Once | INTRAVENOUS | Status: AC
  Administered 2021-03-02: 20 mg via INTRAVENOUS

## 2021-03-02 MED ORDER — HEPARIN SOD (PORK) LOCK FLUSH 100 UNIT/ML IV SOLN
500.0000 [IU] | Freq: Once | INTRAVENOUS | Status: AC | PRN
Start: 2021-03-02 — End: 2021-03-02
  Administered 2021-03-02: 500 [IU]
  Filled 2021-03-02: qty 5

## 2021-03-02 MED ORDER — BETAMETHASONE VALERATE 0.1 % EX OINT
1.0000 "application " | TOPICAL_OINTMENT | Freq: Two times a day (BID) | CUTANEOUS | 0 refills | Status: DC
  Filled 2021-03-02: qty 30, 15d supply, fill #0

## 2021-03-02 MED ORDER — PALONOSETRON HCL INJECTION 0.25 MG/5ML
INTRAVENOUS | Status: AC
  Filled 2021-03-02: qty 5

## 2021-03-02 MED ORDER — SODIUM CHLORIDE 0.9 % IV SOLN
Freq: Once | INTRAVENOUS | Status: AC
  Filled 2021-03-02: qty 250

## 2021-03-02 MED ORDER — SODIUM CHLORIDE 0.9 % IV SOLN
10.0000 mg | Freq: Once | INTRAVENOUS | Status: AC
  Administered 2021-03-02: 10 mg via INTRAVENOUS
  Filled 2021-03-02: qty 10

## 2021-03-02 MED ORDER — FAMOTIDINE 20 MG IN NS 100 ML IVPB
INTRAVENOUS | Status: AC
  Filled 2021-03-02: qty 100

## 2021-03-02 MED ORDER — SODIUM CHLORIDE 0.9% FLUSH
10.0000 mL | INTRAVENOUS | Status: DC | PRN
  Administered 2021-03-02: 10 mL
  Filled 2021-03-02: qty 10

## 2021-03-02 MED ORDER — PALONOSETRON HCL INJECTION 0.25 MG/5ML
0.2500 mg | Freq: Once | INTRAVENOUS | Status: AC
  Administered 2021-03-02: 0.25 mg via INTRAVENOUS

## 2021-03-02 MED ORDER — FOSAPREPITANT DIMEGLUMINE INJECTION 150 MG
150.0000 mg | Freq: Once | INTRAVENOUS | Status: AC
  Administered 2021-03-02: 150 mg via INTRAVENOUS
  Filled 2021-03-02: qty 150

## 2021-03-02 MED ORDER — ALTEPLASE 2 MG IJ SOLR
2.0000 mg | Freq: Once | INTRAMUSCULAR | Status: AC
  Administered 2021-03-02: 2 mg
  Filled 2021-03-02: qty 2

## 2021-03-02 MED ORDER — ALTEPLASE 2 MG IJ SOLR
INTRAMUSCULAR | Status: AC
  Filled 2021-03-02: qty 2

## 2021-03-02 MED ORDER — DIPHENHYDRAMINE HCL 50 MG/ML IJ SOLN
25.0000 mg | Freq: Once | INTRAMUSCULAR | Status: AC
  Administered 2021-03-02: 25 mg via INTRAVENOUS

## 2021-03-02 MED ORDER — SODIUM CHLORIDE 0.9 % IV SOLN
80.0000 mg/m2 | Freq: Once | INTRAVENOUS | Status: AC
  Administered 2021-03-02: 204 mg via INTRAVENOUS
  Filled 2021-03-02 (×2): qty 34

## 2021-03-02 NOTE — Progress Notes (Signed)
Faxed Clinical Intake Worksheet to [employer].  Fax received ok per confirmation sheet. [employer] will complete a benefits investigation and notify us via phone/ fax status of application.

## 2021-03-02 NOTE — Patient Instructions (Signed)
[facility]COL[facility]   Discharge Instructions: Thank you for choosing [facility] to provide your oncology and hematology care.   If you have a lab appointment with the Cancer Center, please go directly to the Cancer Center and check in at the registration area.   Wear comfortable clothing and clothing appropriate for easy access to any Portacath or PICC line.   We strive to give you quality time with your provider. You may need to reschedule your appointment if you arrive late (15 or more minutes).  Arriving late affects you and other patients whose appointments are after yours.  Also, if you miss three or more appointments without notifying the office, you may be dismissed from the clinic at the provider's discretion.      For prescription refill requests, have your pharmacy contact our office and allow 72 hours for refills to be completed.    Today you received the following chemotherapy and/or immunotherapy agents: Pembrolizumab (Keytruda), Paclitaxel (Taxol), and Carboplatin      To help prevent nausea and vomiting after your treatment, we encourage you to take your nausea medication as directed.  BELOW ARE SYMPTOMS THAT SHOULD BE REPORTED IMMEDIATELY: *FEVER GREATER THAN 100.4 F (38 C) OR HIGHER *CHILLS OR SWEATING *NAUSEA AND VOMITING THAT IS NOT CONTROLLED WITH YOUR NAUSEA MEDICATION *UNUSUAL SHORTNESS OF BREATH *UNUSUAL BRUISING OR BLEEDING *URINARY PROBLEMS (pain or burning when urinating, or frequent urination) *BOWEL PROBLEMS (unusual diarrhea, constipation, pain near the anus) TENDERNESS IN MOUTH AND THROAT WITH OR WITHOUT PRESENCE OF ULCERS (sore throat, sores in mouth, or a toothache) UNUSUAL RASH, SWELLING OR PAIN  UNUSUAL VAGINAL DISCHARGE OR ITCHING   Items with * indicate a potential emergency and should be followed up as soon as possible or go to the Emergency Department if any problems should occur.  Please show the CHEMOTHERAPY  ALERT CARD or IMMUNOTHERAPY ALERT CARD at check-in to the Emergency Department and triage nurse.  Should you have questions after your visit or need to cancel or reschedule your appointment, please contact [facility]  Dept: [phone]  and follow the prompts.  Office hours are 8:00 a.m. to 4:30 p.m. Monday - Friday. Please note that voicemails left after 4:00 p.m. may not be returned until the following business day.  We are closed weekends and major holidays. You have access to a nurse at all times for urgent questions. Please call the main number to the clinic Dept: [phone] and follow the prompts.   For any non-urgent questions, you may also contact your provider using MyChart. We now offer e-Visits for anyone 18 and older to request care online for non-urgent symptoms. For details visit [email or web address].   Also download the MyChart app! Go to the app store, search "MyChart", open the app, select [facility], and log in with your MyChart username and password.  Due to Covid, a mask is required upon entering the hospital/clinic. If you do not have a mask, one will be given to you upon arrival. For doctor visits, patients may have 1 support person aged 18 or older with them. For treatment visits, patients cannot have anyone with them due to current Covid guidelines and our immunocompromised population.   

## 2021-03-02 NOTE — Progress Notes (Signed)
Met with patient this morning at registration to provide her portion of [employer] copay assistance application for Keytruda to complete.  Obtain physician signature on provider sheet.  Faxed completed application to [employer]. Fax received ok per confirmation sheet.  She has my card for any additional financial questions or concerns.

## 2021-03-02 NOTE — Patient Instructions (Signed)

## 2021-03-03 ENCOUNTER — Ambulatory Visit: Payer: 59 | Admitting: Adult Health

## 2021-03-03 ENCOUNTER — Ambulatory Visit: Payer: 59

## 2021-03-03 ENCOUNTER — Other Ambulatory Visit: Payer: 59

## 2021-03-08 ENCOUNTER — Other Ambulatory Visit: Payer: Self-pay | Admitting: *Deleted

## 2021-03-08 ENCOUNTER — Encounter: Payer: Self-pay | Admitting: Oncology

## 2021-03-08 NOTE — Assessment & Plan Note (Signed)
palpable left breast lump. She underwent bilateral diagnostic mammography with tomography and bilateral breast ultrasonography at [facility] on 01/12/2021 showing: breast density category C; 5.8 cm mass in the left breast at 12:30 with associated calcifications; additional 1.2 cm group of calcifications in the outer left breast;   On the right: Multiple small masses and irregular ducts in the retroareolar right breast, including a 0.8 cm representative mass at 6 o'clock; indeterminate 0.4 cm mass in upper-inner right breast; normal lymph nodes bilaterally.  Accordingly on 01/16/2021 she proceeded to biopsy of the right breast areas in question. The pathology from this procedure ([id number]) showed:  1. Right Breast, 6 o'clock - invasive ductal carcinoma, grade 2. Prognostic indicators significant for: estrogen receptor, 90% positive with strong staining intensity and progesterone receptor, 0% negative. Proliferation marker Ki67 at 5%. HER2 equivocal by immunohistochemistry (2+), but negative by fluorescent in situ hybridization with a signals ratio 1.61 and number per cell 2.5. 2. Right Breast, 9 o'clock - fibrocystic change with adenosis, calcifications, and a small intraductal papilloma.  She underwent biopsy of the left breast areas on 01/20/2021. Pathology [id number]) revealed: 1. Left Breast, upper-outer quadrant - invasive ductal carcinoma, grade 3 2. Left Breast, central, slightly lateral to midline - ductal carcinoma in situ with necrosis and calcifications, intermediate grade Prognostic panels are pending on both samples. -------------------------------------------------------------------------------------------------------------------------------------------- Current Treatment: Cycle 3 Taxol (carbo and Pembro q 3 weeks) Toxicities: 1.  Maculopapular rash on the face that started 2 days ago.  Topical betamethasone ointment that she  can apply along with emollients like Vaseline to prevent the skin from getting any   RTC weekly for Taxol and next week I would like to see her to reassess her skin issues. 2. mild nausea Denies any peripheral neuropathy

## 2021-03-08 NOTE — Progress Notes (Signed)
Received approval letter from [employer] for [employer].  Patient approved for $25,000 in calendar year 10/01/20 - 09/30/21 starting 12/03/20 which will leave patient with no more than a  $25 copay after insurance pays for [facility].  A copy of the approval letter will be mailed to patient.  A copy of the letter given to [facility] for billing/claim submissions.  Patient has my card for any additional financial questions or concerns.

## 2021-03-08 NOTE — Progress Notes (Signed)
Patient Care Team: Fanny Bien, MD as PCP - General (Family Medicine) Herminio Commons, MD (Inactive) as PCP - Cardiology (Cardiology) Mauro Kaufmann, RN as Oncology Nurse Navigator Rockwell Germany, RN as Oncology Nurse Navigator Stark Klein, MD as Consulting Physician (General Surgery) Kyung Rudd, MD as Consulting Physician (Radiation Oncology)  DIAGNOSIS:    ICD-10-CM   1. Malignant neoplasm of overlapping sites of left breast in female, estrogen receptor negative ([place])  C50.812    Z17.1       SUMMARY OF ONCOLOGIC HISTORY: Oncology History  Malignant neoplasm of lower-outer quadrant of right breast of female, estrogen receptor positive ([place])  01/20/2021 Cancer Staging   Staging form: Breast, AJCC 8th Edition - Clinical stage from 01/20/2021: Stage IA (cT1c, cN0, cM0, G2, ER+, PR-, HER2-) - Signed by Chauncey Cruel, MD on 01/25/2021  Stage prefix: Initial diagnosis  Histologic grading system: 3 grade system    01/23/2021 Initial Diagnosis   Malignant neoplasm of lower-outer quadrant of right breast of female, estrogen receptor positive ([place])    02/10/2021 -  Chemotherapy    Patient is on Treatment Plan: BREAST PEMBROLIZUMAB + CARBOPLATIN D1 + PACLITAXEL D1,8,15 Q21D X 4 CYCLES / PEMBROLIZUMAB + AC Q21D X 4 CYCLES        Genetic Testing   Negative genetic testing. No pathogenic variants identified on the [facility] Multi-Cancer Panel+RNA. VUS in ATM called c.1132A>G identified. The report date is 02/13/2021.  The Multi-Cancer Panel + RNA offered by [facility] includes sequencing and/or deletion duplication testing of the following 84 genes: AIP, ALK, APC, ATM, AXIN2,BAP1,  BARD1, BLM, BMPR1A, BRCA1, BRCA2, BRIP1, CASR, CDC73, CDH1, CDK4, CDKN1B, CDKN1C, CDKN2A (p14ARF), CDKN2A (p16INK4a), CEBPA, CHEK2, CTNNA1, DICER1, DIS3L2, EGFR (c.2369C>T, p.Thr790Met variant only), EPCAM (Deletion/duplication testing only), FH, FLCN, GATA2, GPC3, GREM1 (Promoter region  deletion/duplication testing only), HOXB13 (c.251G>A, p.Gly84Glu), HRAS, KIT, MAX, MEN1, MET, MITF (c.952G>A, p.Glu318Lys variant only), MLH1, MSH2, MSH3, MSH6, MUTYH, NBN, NF1, NF2, NTHL1, PALB2, PDGFRA, PHOX2B, PMS2, POLD1, POLE, POT1, PRKAR1A, PTCH1, PTEN, RAD50, RAD51C, RAD51D, RB1, RECQL4, RET, RUNX1, SDHAF2, SDHA (sequence changes only), SDHB, SDHC, SDHD, SMAD4, SMARCA4, SMARCB1, SMARCE1, STK11, SUFU, TERC, TERT, TMEM127, TP53, TSC1, TSC2, VHL, WRN and WT1.   Malignant neoplasm of overlapping sites of left breast in female, estrogen receptor negative ([place])  01/20/2021 Cancer Staging   Staging form: Breast, AJCC 8th Edition - Clinical stage from 01/20/2021: Stage IIIB (cT3, cN0, cM0, G3, ER-, PR-, HER2-) - Signed by Chauncey Cruel, MD on 01/25/2021  Histologic grading system: 3 grade system    01/25/2021 Initial Diagnosis   Malignant neoplasm of overlapping sites of left breast in female, estrogen receptor negative ([place])    02/10/2021 -  Chemotherapy    Patient is on Treatment Plan: BREAST PEMBROLIZUMAB + CARBOPLATIN D1 + PACLITAXEL D1,8,15 Q21D X 4 CYCLES / PEMBROLIZUMAB + AC Q21D X 4 CYCLES        Genetic Testing   Negative genetic testing. No pathogenic variants identified on the [facility] Multi-Cancer Panel+RNA. VUS in ATM called c.1132A>G identified. The report date is 02/13/2021.  The Multi-Cancer Panel + RNA offered by [facility] includes sequencing and/or deletion duplication testing of the following 84 genes: AIP, ALK, APC, ATM, AXIN2,BAP1,  BARD1, BLM, BMPR1A, BRCA1, BRCA2, BRIP1, CASR, CDC73, CDH1, CDK4, CDKN1B, CDKN1C, CDKN2A (p14ARF), CDKN2A (p16INK4a), CEBPA, CHEK2, CTNNA1, DICER1, DIS3L2, EGFR (c.2369C>T, p.Thr790Met variant only), EPCAM (Deletion/duplication testing only), FH, FLCN, GATA2, GPC3, GREM1 (Promoter region deletion/duplication testing only), HOXB13 (c.251G>A, p.Gly84Glu), HRAS, KIT,  MAX, MEN1, MET, MITF (c.952G>A, p.Glu318Lys variant only), MLH1, MSH2, MSH3, MSH6,  MUTYH, NBN, NF1, NF2, NTHL1, PALB2, PDGFRA, PHOX2B, PMS2, POLD1, POLE, POT1, PRKAR1A, PTCH1, PTEN, RAD50, RAD51C, RAD51D, RB1, RECQL4, RET, RUNX1, SDHAF2, SDHA (sequence changes only), SDHB, SDHC, SDHD, SMAD4, SMARCA4, SMARCB1, SMARCE1, STK11, SUFU, TERC, TERT, TMEM127, TP53, TSC1, TSC2, VHL, WRN and WT1.     CHIEF COMPLIANT: Cycle 5 Taxol, Carboplatin, and Keytruda  INTERVAL HISTORY: Leslie Wright is a 55 y.o. with above-mentioned history of bilateral breast cancers currently on neoadjuvant chemotherapy with Taxol, Carboplatin, and Keytruda. She presents to the clinic today for treatment and for follow-up of a maculopapular rash on her face.  The rash on the face has resolved but she developed subcutaneous nodules that are on the scalp as well as the right lower lip.  She is having difficulty with eating because of the sore on the lip.  She feels great for the first 3 days after chemo and then her fatigue sets in.  ALLERGIES:  has No Known Allergies.  MEDICATIONS:  Current Outpatient Medications  Medication Sig Dispense Refill   amoxicillin-clavulanate (AUGMENTIN) 875-125 MG tablet Take 1 tablet by mouth 2 (two) times daily. 14 tablet 0   acetaminophen (TYLENOL) 500 MG tablet Take 1,000 mg by mouth every 6 (six) hours as needed for moderate pain.     aspirin EC 81 MG tablet Take 81 mg by mouth daily.     betamethasone valerate ointment (VALISONE) 0.1 % Apply 1 application topically 2 (two) times daily. 30 g 0   Blood Glucose Monitoring Suppl (FREESTYLE LITE) w/Device KIT CHECK GLUCOSE 2-3 TIMES DAILY 1 kit 0   Cholecalciferol (VITAMIN D) 50 MCG (2000 UT) CAPS Take 2,000 Units by mouth daily.     dexamethasone (DECADRON) 4 MG tablet Take 2 tablets once a day for 3 days after carboplatin and AC chemotherapy. Take with food. 30 tablet 1   ELDERBERRY PO Take 1 capsule by mouth daily.     folic acid (FOLVITE) 1 MG tablet Take 1 mg by mouth daily.     glucose blood test strip USE AS DIRECTED TO  CHECK BLOOD SUGAR 1 TO 3 TIMES A DAY (Patient taking differently: USE AS DIRECTED TO CHECK BLOOD SUGAR 1 TO 3 TIMES A DAY) 100 strip 3   ketoconazole (NIZORAL) 2 % cream Apply 1 application topically daily. (Patient taking differently: Apply 1 application topically daily as needed.) 15 g 0   Lancets (FREESTYLE) lancets USE AS DIRECTED TO CHECK BLOOD SUGAR 2 TO 3 TIMES A DAY (Patient taking differently: USE AS DIRECTED TO CHECK BLOOD SUGAR 2 TO 3 TIMES A DAY) 100 each 3   lidocaine-prilocaine (EMLA) cream Apply to affected area once (Patient not taking: Reported on 02/20/2021) 30 g 3   losartan (COZAAR) 100 MG tablet Take 1 tablet by mouth daily 90 tablet 3   methotrexate (RHEUMATREX) 15 MG tablet Take 15 mg by mouth every Wednesday. Caution: Chemotherapy. Protect from light. (Patient not taking: Reported on 02/21/2021)     Multiple Vitamin (MULTIVITAMIN WITH MINERALS) TABS tablet Take 1 tablet by mouth daily.     omeprazole (PRILOSEC) 20 MG capsule Take 20 mg by mouth daily.     ondansetron (ZOFRAN) 8 MG tablet Take 1 tablet (8 mg total) by mouth 2 (two) times daily as needed. Start on the third day after carboplatin and AC chemotherapy. 30 tablet 1   prochlorperazine (COMPAZINE) 10 MG tablet Take 1 tablet (10 mg total) by mouth  every 6 (six) hours as needed (Nausea or vomiting). 30 tablet 1   rosuvastatin (CRESTOR) 10 MG tablet TAKE 1 TABLET BY MOUTH ONCE A DAY AT BEDTIME FOR CHOLESTEROL (Patient taking differently: Take 10 mg by mouth at bedtime.) 90 tablet 1   No current facility-administered medications for this visit.    PHYSICAL EXAMINATION: ECOG PERFORMANCE STATUS: 1 - Symptomatic but completely ambulatory      Vitals:   03/09/21 1115  BP: 100/64  Pulse: 94  Resp: 18  Temp: (!) 97.5 F (36.4 C)  SpO2: 97%   Filed Weights   03/09/21 1115  Weight: 292 lb 14.4 oz (132.9 kg)    LABORATORY DATA:  I have reviewed the data as listed CMP Latest Ref Rng & Units 03/02/2021 02/23/2021  02/17/2021  Glucose 70 - 99 mg/dL 108(H) 95 116(H)  BUN 6 - 20 mg/dL _0 Creatinine 0.44 - 1.00 mg/dL 1.42(H) 1.13(H) 1.17(H)  Sodium 135 - 145 mmol/L 142 143 139  Potassium 3.5 - 5.1 mmol/L 3.8 4.3 4.0  Chloride 98 - 111 mmol/L 106 109 104  CO2 22 - 32 mmol/L _1 Calcium 8.9 - 10.3 mg/dL 10.0 8.7(L) 9.0  Total Protein 6.5 - 8.1 g/dL 7.3 6.5 6.6  Total Bilirubin 0.3 - 1.2 mg/dL 0.4 0.3 0.4  Alkaline Phos 38 - 126 U/L 82 67 69  AST 15 - 41 U/L _2 ALT 0 - 44 U/L _3 Lab Results  Component Value Date   WBC 7.3 03/09/2021   HGB 11.9 (L) 03/09/2021   HCT 35.8 (L) 03/09/2021   MCV 87.5 03/09/2021   PLT 178 03/09/2021   NEUTROABS 4.9 03/09/2021    ASSESSMENT & PLAN:  Malignant neoplasm of overlapping sites of left breast in female, estrogen receptor negative ([place]) palpable left breast lump. She underwent bilateral diagnostic mammography with tomography and bilateral breast ultrasonography at [facility] on 01/12/2021 showing: breast density category C; 5.8 cm mass in the left breast at 12:30 with associated calcifications; additional 1.2 cm group of calcifications in the outer left breast;    On the right: Multiple small masses and irregular ducts in the retroareolar right breast, including a 0.8 cm representative mass at 6 o'clock; indeterminate 0.4 cm mass in upper-inner right breast; normal lymph nodes bilaterally.   Accordingly on 01/16/2021 she proceeded to biopsy of the right breast areas in question. The pathology from this procedure ([id number]) showed:  1. Right Breast, 6 o'clock             - invasive ductal carcinoma, grade 2. Prognostic indicators significant for: estrogen receptor, 90% positive with strong staining intensity and progesterone receptor, 0% negative. Proliferation marker Ki67 at 5%. HER2 equivocal by immunohistochemistry (2+), but negative by fluorescent in situ hybridization with a signals ratio 1.61 and number per cell 2.5. 2.  Right Breast, 9 o'clock             - fibrocystic change with adenosis, calcifications, and a small intraductal papilloma.   She underwent biopsy of the left breast areas on 01/20/2021. Pathology [id number]) revealed: 1. Left Breast, upper-outer quadrant             - invasive ductal carcinoma, grade 3 2. Left Breast, central, slightly lateral to midline             - ductal carcinoma in situ with necrosis and calcifications, intermediate grade Prognostic panels are pending on both samples. --------------------------------------------------------------------------------------------------------------------------------------------  Current Treatment: Cycle 5 Taxol (carbo and Pembro q 3 weeks) Toxicities: 1.  Maculopapular rash on the face:  Topical betamethasone ointment and emollients like Vaseline to prevent the skin from getting dry. The rash has resolved. 2. mild nausea Denies any peripheral neuropathy 3.  Subcutaneous nodules on the right lower lip as well as scalp: On the inside of the mouth I could see her posteriorly and therefore I sent an antibiotic prescription for Augmentin 875 p.o. twice daily for a week. 4.  Blood pressure is running slightly low.  She is not symptomatic. She wants to take [employer] drinks.  Return to clinic in 1 week to follow-up with Corliss Skains for her next treatment.  No orders of the defined types were placed in this encounter.  The patient has a good understanding of the overall plan. she agrees with it. she will call with any problems that may develop before the next visit here.  Total time spent: 30 mins including face to face time and time spent for planning, charting and coordination of care  Rulon Eisenmenger, MD, MPH 03/09/2021  I, Cloyde Reams Dorshimer, am acting as scribe for Dr. Nicholas Lose.  I have reviewed the above documentation for accuracy and completeness, and I agree with the above.

## 2021-03-09 ENCOUNTER — Inpatient Hospital Stay: Payer: 59

## 2021-03-09 ENCOUNTER — Inpatient Hospital Stay: Payer: 59 | Admitting: Hematology and Oncology

## 2021-03-09 ENCOUNTER — Other Ambulatory Visit: Payer: Self-pay

## 2021-03-09 ENCOUNTER — Other Ambulatory Visit (HOSPITAL_COMMUNITY): Payer: Self-pay

## 2021-03-09 DIAGNOSIS — Z5111 Encounter for antineoplastic chemotherapy: Secondary | ICD-10-CM | POA: Diagnosis not present

## 2021-03-09 DIAGNOSIS — C50511 Malignant neoplasm of lower-outer quadrant of right female breast: Secondary | ICD-10-CM

## 2021-03-09 DIAGNOSIS — Z171 Estrogen receptor negative status [ER-]: Secondary | ICD-10-CM

## 2021-03-09 DIAGNOSIS — C50812 Malignant neoplasm of overlapping sites of left female breast: Secondary | ICD-10-CM

## 2021-03-09 DIAGNOSIS — Z79899 Other long term (current) drug therapy: Secondary | ICD-10-CM | POA: Diagnosis not present

## 2021-03-09 DIAGNOSIS — Z5189 Encounter for other specified aftercare: Secondary | ICD-10-CM | POA: Diagnosis not present

## 2021-03-09 DIAGNOSIS — Z170 Estrogen receptor positive status [ER+]: Secondary | ICD-10-CM | POA: Diagnosis not present

## 2021-03-09 DIAGNOSIS — Z5112 Encounter for antineoplastic immunotherapy: Secondary | ICD-10-CM | POA: Diagnosis not present

## 2021-03-09 DIAGNOSIS — Z95828 Presence of other vascular implants and grafts: Secondary | ICD-10-CM | POA: Insufficient documentation

## 2021-03-09 LAB — CBC WITH DIFFERENTIAL/PLATELET
Abs Immature Granulocytes: 0.04 10*3/uL (ref 0.00–0.07)
Basophils Absolute: 0.1 10*3/uL (ref 0.0–0.1)
Basophils Relative: 1 %
Eosinophils Absolute: 0.1 10*3/uL (ref 0.0–0.5)
Eosinophils Relative: 2 %
HCT: 35.8 % — ABNORMAL LOW (ref 36.0–46.0)
Hemoglobin: 11.9 g/dL — ABNORMAL LOW (ref 12.0–15.0)
Immature Granulocytes: 1 %
Lymphocytes Relative: 20 %
Lymphs Abs: 1.5 10*3/uL (ref 0.7–4.0)
MCH: 29.1 pg (ref 26.0–34.0)
MCHC: 33.2 g/dL (ref 30.0–36.0)
MCV: 87.5 fL (ref 80.0–100.0)
Monocytes Absolute: 0.6 10*3/uL (ref 0.1–1.0)
Monocytes Relative: 9 %
Neutro Abs: 4.9 10*3/uL (ref 1.7–7.7)
Neutrophils Relative %: 67 %
Platelets: 178 10*3/uL (ref 150–400)
RBC: 4.09 MIL/uL (ref 3.87–5.11)
RDW: 16.8 % — ABNORMAL HIGH (ref 11.5–15.5)
WBC: 7.3 10*3/uL (ref 4.0–10.5)
nRBC: 0 % (ref 0.0–0.2)

## 2021-03-09 LAB — COMPREHENSIVE METABOLIC PANEL
ALT: 27 U/L (ref 0–44)
AST: 20 U/L (ref 15–41)
Albumin: 3.3 g/dL — ABNORMAL LOW (ref 3.5–5.0)
Alkaline Phosphatase: 83 U/L (ref 38–126)
Anion gap: 10 (ref 5–15)
BUN: 15 mg/dL (ref 6–20)
CO2: 25 mmol/L (ref 22–32)
Calcium: 9.8 mg/dL (ref 8.9–10.3)
Chloride: 105 mmol/L (ref 98–111)
Creatinine, Ser: 1.87 mg/dL — ABNORMAL HIGH (ref 0.44–1.00)
GFR, Estimated: 31 mL/min — ABNORMAL LOW (ref >60)
Glucose, Bld: 126 mg/dL — ABNORMAL HIGH (ref 70–99)
Potassium: 4.1 mmol/L (ref 3.5–5.1)
Sodium: 140 mmol/L (ref 135–145)
Total Bilirubin: 0.5 mg/dL (ref 0.3–1.2)
Total Protein: 7.5 g/dL (ref 6.5–8.1)

## 2021-03-09 MED ORDER — DIPHENHYDRAMINE HCL 50 MG/ML IJ SOLN
25.0000 mg | Freq: Once | INTRAMUSCULAR | Status: AC
  Administered 2021-03-09: 25 mg via INTRAVENOUS

## 2021-03-09 MED ORDER — SODIUM CHLORIDE 0.9 % IV SOLN
Freq: Once | INTRAVENOUS | Status: AC
Start: 2021-03-09 — End: 2021-03-09
  Filled 2021-03-09: qty 250

## 2021-03-09 MED ORDER — SODIUM CHLORIDE 0.9 % IV SOLN
10.0000 mg | Freq: Once | INTRAVENOUS | Status: AC
  Administered 2021-03-09: 10 mg via INTRAVENOUS
  Filled 2021-03-09: qty 10

## 2021-03-09 MED ORDER — FAMOTIDINE 20 MG IN NS 100 ML IVPB
20.0000 mg | Freq: Once | INTRAVENOUS | Status: AC
  Administered 2021-03-09: 20 mg via INTRAVENOUS

## 2021-03-09 MED ORDER — FAMOTIDINE 20 MG IN NS 100 ML IVPB
INTRAVENOUS | Status: AC
  Filled 2021-03-09: qty 100

## 2021-03-09 MED ORDER — AMOXICILLIN-POT CLAVULANATE 875-125 MG PO TABS
1.0000 | ORAL_TABLET | Freq: Two times a day (BID) | ORAL | 0 refills | Status: DC
  Filled 2021-03-09: qty 14, 7d supply, fill #0

## 2021-03-09 MED ORDER — SODIUM CHLORIDE 0.9% FLUSH
10.0000 mL | Freq: Once | INTRAVENOUS | Status: AC
  Administered 2021-03-09: 10 mL
  Filled 2021-03-09: qty 10

## 2021-03-09 MED ORDER — SODIUM CHLORIDE 0.9% FLUSH
10.0000 mL | INTRAVENOUS | Status: DC | PRN
  Administered 2021-03-09: 10 mL
  Filled 2021-03-09: qty 10

## 2021-03-09 MED ORDER — SODIUM CHLORIDE 0.9 % IV SOLN
80.0000 mg/m2 | Freq: Once | INTRAVENOUS | Status: AC
  Administered 2021-03-09: 204 mg via INTRAVENOUS
  Filled 2021-03-09: qty 34

## 2021-03-09 MED ORDER — ALTEPLASE 2 MG IJ SOLR
2.0000 mg | Freq: Once | INTRAMUSCULAR | Status: AC
Start: 2021-03-09 — End: 2021-03-09
  Administered 2021-03-09: 2 mg
  Filled 2021-03-09: qty 2

## 2021-03-09 MED ORDER — ALTEPLASE 2 MG IJ SOLR
INTRAMUSCULAR | Status: AC
  Filled 2021-03-09: qty 2

## 2021-03-09 MED ORDER — HEPARIN SOD (PORK) LOCK FLUSH 100 UNIT/ML IV SOLN
500.0000 [IU] | Freq: Once | INTRAVENOUS | Status: AC | PRN
  Administered 2021-03-09: 500 [IU]
  Filled 2021-03-09: qty 5

## 2021-03-09 NOTE — Progress Notes (Signed)
Unable to get blood from pt's port. Cathflo was given at 1034 by Abigail Butts, [place]. Pt sent to lab for peripheral lab draw.

## 2021-03-09 NOTE — Progress Notes (Signed)
Per Dr. Gudena, ok to treat with elevated scr.  

## 2021-03-09 NOTE — Patient Instructions (Signed)
[facility] ONCOL[facility]  Discharge Instructions: Thank you for choosing [facility] to provide your oncology and hematology care.   If you have a lab appointment with the [facility], please go directly to the [facility] and check in at the registration area.   Wear comfortable clothing and clothing appropriate for easy access to any Portacath or PICC line.   We strive to give you quality time with your provider. You may need to reschedule your appointment if you arrive late (15 or more minutes).  Arriving late affects you and other patients whose appointments are after yours.  Also, if you miss three or more appointments without notifying the office, you may be dismissed from the clinic at the provider's discretion.      For prescription refill requests, have your pharmacy contact our office and allow 72 hours for refills to be completed.    Today you received the following chemotherapy and/or immunotherapy agents Taxol      To help prevent nausea and vomiting after your treatment, we encourage you to take your nausea medication as directed.  BELOW ARE SYMPTOMS THAT SHOULD BE REPORTED IMMEDIATELY: *FEVER GREATER THAN 100.4 F (38 C) OR HIGHER *CHILLS OR SWEATING *NAUSEA AND VOMITING THAT IS NOT CONTROLLED WITH YOUR NAUSEA MEDICATION *UNUSUAL SHORTNESS OF BREATH *UNUSUAL BRUISING OR BLEEDING *URINARY PROBLEMS (pain or burning when urinating, or frequent urination) *BOWEL PROBLEMS (unusual diarrhea, constipation, pain near the anus) TENDERNESS IN MOUTH AND THROAT WITH OR WITHOUT PRESENCE OF ULCERS (sore throat, sores in mouth, or a toothache) UNUSUAL RASH, SWELLING OR PAIN  UNUSUAL VAGINAL DISCHARGE OR ITCHING   Items with * indicate a potential emergency and should be followed up as soon as possible or go to the Emergency Department if any problems should occur.  Please show the CHEMOTHERAPY ALERT CARD or IMMUNOTHERAPY ALERT CARD at check-in to the  Emergency Department and triage nurse.  Should you have questions after your visit or need to cancel or reschedule your appointment, please contact [facility]  Dept: [phone]  and follow the prompts.  Office hours are 8:00 a.m. to 4:30 p.m. Monday - Friday. Please note that voicemails left after 4:00 p.m. may not be returned until the following business day.  We are closed weekends and major holidays. You have access to a nurse at all times for urgent questions. Please call the main number to the clinic Dept: [phone] and follow the prompts.   For any non-urgent questions, you may also contact your provider using MyChart. We now offer e-Visits for anyone 47 and older to request care online for non-urgent symptoms. For details visit [email or web address].   Also download the MyChart app! Go to the app store, search "MyChart", open the app, select [facility], and log in with your MyChart username and password.  Due to Covid, a mask is required upon entering the hospital/clinic. If you do not have a mask, one will be given to you upon arrival. For doctor visits, patients may have 1 support person aged 91 or older with them. For treatment visits, patients cannot have anyone with them due to current Covid guidelines and our immunocompromised population.

## 2021-03-10 ENCOUNTER — Other Ambulatory Visit: Payer: 59

## 2021-03-10 ENCOUNTER — Ambulatory Visit: Payer: 59

## 2021-03-15 DIAGNOSIS — E1165 Type 2 diabetes mellitus with hyperglycemia: Secondary | ICD-10-CM | POA: Diagnosis not present

## 2021-03-15 DIAGNOSIS — C50919 Malignant neoplasm of unspecified site of unspecified female breast: Secondary | ICD-10-CM | POA: Diagnosis not present

## 2021-03-15 DIAGNOSIS — N289 Disorder of kidney and ureter, unspecified: Secondary | ICD-10-CM | POA: Diagnosis not present

## 2021-03-15 DIAGNOSIS — L719 Rosacea, unspecified: Secondary | ICD-10-CM | POA: Diagnosis not present

## 2021-03-15 DIAGNOSIS — I10 Essential (primary) hypertension: Secondary | ICD-10-CM | POA: Diagnosis not present

## 2021-03-16 ENCOUNTER — Inpatient Hospital Stay: Payer: 59 | Admitting: Adult Health

## 2021-03-16 ENCOUNTER — Other Ambulatory Visit: Payer: Self-pay

## 2021-03-16 ENCOUNTER — Encounter: Payer: Self-pay | Admitting: Oncology

## 2021-03-16 ENCOUNTER — Encounter: Payer: Self-pay | Admitting: Hematology and Oncology

## 2021-03-16 ENCOUNTER — Other Ambulatory Visit (HOSPITAL_COMMUNITY): Payer: Self-pay

## 2021-03-16 ENCOUNTER — Inpatient Hospital Stay: Payer: 59

## 2021-03-16 ENCOUNTER — Encounter: Payer: Self-pay | Admitting: *Deleted

## 2021-03-16 VITALS — BP 142/45 | HR 72 | Temp 97.6°F | Resp 18 | Ht 67.0 in | Wt 303.8 lb

## 2021-03-16 DIAGNOSIS — L739 Follicular disorder, unspecified: Secondary | ICD-10-CM | POA: Diagnosis not present

## 2021-03-16 DIAGNOSIS — C50812 Malignant neoplasm of overlapping sites of left female breast: Secondary | ICD-10-CM

## 2021-03-16 DIAGNOSIS — Z170 Estrogen receptor positive status [ER+]: Secondary | ICD-10-CM | POA: Diagnosis not present

## 2021-03-16 DIAGNOSIS — Z5189 Encounter for other specified aftercare: Secondary | ICD-10-CM | POA: Diagnosis not present

## 2021-03-16 DIAGNOSIS — Z5112 Encounter for antineoplastic immunotherapy: Secondary | ICD-10-CM | POA: Diagnosis not present

## 2021-03-16 DIAGNOSIS — Z79899 Other long term (current) drug therapy: Secondary | ICD-10-CM | POA: Diagnosis not present

## 2021-03-16 DIAGNOSIS — Z5111 Encounter for antineoplastic chemotherapy: Secondary | ICD-10-CM | POA: Diagnosis not present

## 2021-03-16 DIAGNOSIS — Z171 Estrogen receptor negative status [ER-]: Secondary | ICD-10-CM

## 2021-03-16 DIAGNOSIS — Z95828 Presence of other vascular implants and grafts: Secondary | ICD-10-CM

## 2021-03-16 DIAGNOSIS — C50511 Malignant neoplasm of lower-outer quadrant of right female breast: Secondary | ICD-10-CM

## 2021-03-16 LAB — CMP (CANCER CENTER ONLY)
ALT: 28 U/L (ref 0–44)
AST: 19 U/L (ref 15–41)
Albumin: 3.6 g/dL (ref 3.5–5.0)
Alkaline Phosphatase: 60 U/L (ref 38–126)
Anion gap: 6 (ref 5–15)
BUN: 20 mg/dL (ref 6–20)
CO2: 26 mmol/L (ref 22–32)
Calcium: 8.8 mg/dL — ABNORMAL LOW (ref 8.9–10.3)
Chloride: 109 mmol/L (ref 98–111)
Creatinine: 1.31 mg/dL — ABNORMAL HIGH (ref 0.44–1.00)
GFR, Estimated: 48 mL/min — ABNORMAL LOW (ref >60)
Glucose, Bld: 109 mg/dL — ABNORMAL HIGH (ref 70–99)
Potassium: 3.6 mmol/L (ref 3.5–5.1)
Sodium: 141 mmol/L (ref 135–145)
Total Bilirubin: <0.1 mg/dL — ABNORMAL LOW (ref 0.3–1.2)
Total Protein: 6.5 g/dL (ref 6.5–8.1)

## 2021-03-16 LAB — CBC WITH DIFFERENTIAL/PLATELET
Abs Immature Granulocytes: 0.29 10*3/uL — ABNORMAL HIGH (ref 0.00–0.07)
Basophils Absolute: 0.1 10*3/uL (ref 0.0–0.1)
Basophils Relative: 1 %
Eosinophils Absolute: 0.1 10*3/uL (ref 0.0–0.5)
Eosinophils Relative: 1 %
HCT: 29.1 % — ABNORMAL LOW (ref 36.0–46.0)
Hemoglobin: 9.7 g/dL — ABNORMAL LOW (ref 12.0–15.0)
Immature Granulocytes: 5 %
Lymphocytes Relative: 47 %
Lymphs Abs: 3 10*3/uL (ref 0.7–4.0)
MCH: 29.4 pg (ref 26.0–34.0)
MCHC: 33.3 g/dL (ref 30.0–36.0)
MCV: 88.2 fL (ref 80.0–100.0)
Monocytes Absolute: 0.5 10*3/uL (ref 0.1–1.0)
Monocytes Relative: 9 %
Neutro Abs: 2.3 10*3/uL (ref 1.7–7.7)
Neutrophils Relative %: 37 %
Platelets: 166 10*3/uL (ref 150–400)
RBC: 3.3 MIL/uL — ABNORMAL LOW (ref 3.87–5.11)
RDW: 17.5 % — ABNORMAL HIGH (ref 11.5–15.5)
WBC: 6.2 10*3/uL (ref 4.0–10.5)
nRBC: 0.5 % — ABNORMAL HIGH (ref 0.0–0.2)

## 2021-03-16 MED ORDER — FAMOTIDINE 20 MG IN NS 100 ML IVPB
INTRAVENOUS | Status: AC
  Filled 2021-03-16: qty 100

## 2021-03-16 MED ORDER — SODIUM CHLORIDE 0.9 % IV SOLN
10.0000 mg | Freq: Once | INTRAVENOUS | Status: AC
  Administered 2021-03-16: 10 mg via INTRAVENOUS
  Filled 2021-03-16: qty 10

## 2021-03-16 MED ORDER — HEPARIN SOD (PORK) LOCK FLUSH 100 UNIT/ML IV SOLN
500.0000 [IU] | Freq: Once | INTRAVENOUS | Status: AC | PRN
  Administered 2021-03-16: 500 [IU]
  Filled 2021-03-16: qty 5

## 2021-03-16 MED ORDER — DIPHENHYDRAMINE HCL 50 MG/ML IJ SOLN
25.0000 mg | Freq: Once | INTRAMUSCULAR | Status: AC
Start: 2021-03-16 — End: 2021-03-16
  Administered 2021-03-16: 25 mg via INTRAVENOUS

## 2021-03-16 MED ORDER — SODIUM CHLORIDE 0.9% FLUSH
10.0000 mL | Freq: Once | INTRAVENOUS | Status: AC
  Administered 2021-03-16: 10 mL
  Filled 2021-03-16: qty 10

## 2021-03-16 MED ORDER — SODIUM CHLORIDE 0.9 % IV SOLN
Freq: Once | INTRAVENOUS | Status: AC
Start: 2021-03-16 — End: 2021-03-16
  Filled 2021-03-16: qty 250

## 2021-03-16 MED ORDER — FAMOTIDINE 20 MG IN NS 100 ML IVPB
20.0000 mg | Freq: Once | INTRAVENOUS | Status: AC
  Administered 2021-03-16: 20 mg via INTRAVENOUS

## 2021-03-16 MED ORDER — SODIUM CHLORIDE 0.9% FLUSH
10.0000 mL | INTRAVENOUS | Status: DC | PRN
  Administered 2021-03-16: 10 mL
  Filled 2021-03-16: qty 10

## 2021-03-16 MED ORDER — CLINDAMYCIN PHOSPHATE 1 % EX GEL
Freq: Two times a day (BID) | CUTANEOUS | 0 refills | Status: DC
  Filled 2021-03-16: qty 30, 30d supply, fill #0

## 2021-03-16 MED ORDER — DIPHENHYDRAMINE HCL 50 MG/ML IJ SOLN
INTRAMUSCULAR | Status: AC
  Filled 2021-03-16: qty 1

## 2021-03-16 MED ORDER — SODIUM CHLORIDE 0.9 % IV SOLN
80.0000 mg/m2 | Freq: Once | INTRAVENOUS | Status: AC
  Administered 2021-03-16: 204 mg via INTRAVENOUS
  Filled 2021-03-16: qty 34

## 2021-03-16 NOTE — Progress Notes (Signed)
[facility] Follow up:    Leslie Wright, [address] [address]   DIAGNOSIS: Cancer Staging Malignant neoplasm of lower-outer quadrant of right breast of female, estrogen receptor positive ([place]) Staging form: Breast, AJCC 8th Edition - Clinical stage from 01/20/2021: Stage IA (cT1c, cN0, cM0, G2, ER+, PR-, HER2-) - Signed by Chauncey Cruel, MD on 01/25/2021 Stage prefix: Initial diagnosis Histologic grading system: 3 grade system  Malignant neoplasm of overlapping sites of left breast in female, estrogen receptor negative ([place]) Staging form: Breast, AJCC 8th Edition - Clinical stage from 01/20/2021: Stage IIIB (cT3, cN0, cM0, G3, ER-, PR-, HER2-) - Signed by Chauncey Cruel, MD on 01/25/2021 Histologic grading system: 3 grade system   SUMMARY OF ONCOLOGIC HISTORY: Oncology History  Malignant neoplasm of lower-outer quadrant of right breast of female, estrogen receptor positive ([place])  01/20/2021 Cancer Staging   Staging form: Breast, AJCC 8th Edition - Clinical stage from 01/20/2021: Stage IA (cT1c, cN0, cM0, G2, ER+, PR-, HER2-) - Signed by Chauncey Cruel, MD on 01/25/2021  Stage prefix: Initial diagnosis  Histologic grading system: 3 grade system    01/23/2021 Initial Diagnosis   Malignant neoplasm of lower-outer quadrant of right breast of female, estrogen receptor positive ([place])    02/10/2021 -  Chemotherapy    Patient is on Treatment Plan: BREAST PEMBROLIZUMAB + CARBOPLATIN D1 + PACLITAXEL D1,8,15 Q21D X 4 CYCLES / PEMBROLIZUMAB + AC Q21D X 4 CYCLES        Genetic Testing   Negative genetic testing. No pathogenic variants identified on the [facility] Multi-Cancer Panel+RNA. VUS in ATM called c.1132A>G identified. The report date is 02/13/2021.  The Multi-Cancer Panel + RNA offered by [facility] includes sequencing and/or deletion duplication testing of the following 84 genes: AIP, ALK, APC, ATM, AXIN2,BAP1,  BARD1, BLM,  BMPR1A, BRCA1, BRCA2, BRIP1, CASR, CDC73, CDH1, CDK4, CDKN1B, CDKN1C, CDKN2A (p14ARF), CDKN2A (p16INK4a), CEBPA, CHEK2, CTNNA1, DICER1, DIS3L2, EGFR (c.2369C>T, p.Thr790Met variant only), EPCAM (Deletion/duplication testing only), FH, FLCN, GATA2, GPC3, GREM1 (Promoter region deletion/duplication testing only), HOXB13 (c.251G>A, p.Gly84Glu), HRAS, KIT, MAX, MEN1, MET, MITF (c.952G>A, p.Glu318Lys variant only), MLH1, MSH2, MSH3, MSH6, MUTYH, NBN, NF1, NF2, NTHL1, PALB2, PDGFRA, PHOX2B, PMS2, POLD1, POLE, POT1, PRKAR1A, PTCH1, PTEN, RAD50, RAD51C, RAD51D, RB1, RECQL4, RET, RUNX1, SDHAF2, SDHA (sequence changes only), SDHB, SDHC, SDHD, SMAD4, SMARCA4, SMARCB1, SMARCE1, STK11, SUFU, TERC, TERT, TMEM127, TP53, TSC1, TSC2, VHL, WRN and WT1.   Malignant neoplasm of overlapping sites of left breast in female, estrogen receptor negative ([place])  01/20/2021 Cancer Staging   Staging form: Breast, AJCC 8th Edition - Clinical stage from 01/20/2021: Stage IIIB (cT3, cN0, cM0, G3, ER-, PR-, HER2-) - Signed by Chauncey Cruel, MD on 01/25/2021  Histologic grading system: 3 grade system    01/25/2021 Initial Diagnosis   Malignant neoplasm of overlapping sites of left breast in female, estrogen receptor negative ([place])    02/10/2021 -  Chemotherapy    Patient is on Treatment Plan: BREAST PEMBROLIZUMAB + CARBOPLATIN D1 + PACLITAXEL D1,8,15 Q21D X 4 CYCLES / PEMBROLIZUMAB + AC Q21D X 4 CYCLES        Genetic Testing   Negative genetic testing. No pathogenic variants identified on the [facility] Multi-Cancer Panel+RNA. VUS in ATM called c.1132A>G identified. The report date is 02/13/2021.  The Multi-Cancer Panel + RNA offered by [facility] includes sequencing and/or deletion duplication testing of the following 84 genes: AIP, ALK, APC, ATM, AXIN2,BAP1,  BARD1, BLM, BMPR1A,  BRCA1, BRCA2, BRIP1, CASR, CDC73, CDH1, CDK4, CDKN1B, CDKN1C, CDKN2A (p14ARF), CDKN2A (p16INK4a), CEBPA, CHEK2, CTNNA1, DICER1, DIS3L2, EGFR (c.2369C>T,  p.Thr790Met variant only), EPCAM (Deletion/duplication testing only), FH, FLCN, GATA2, GPC3, GREM1 (Promoter region deletion/duplication testing only), HOXB13 (c.251G>A, p.Gly84Glu), HRAS, KIT, MAX, MEN1, MET, MITF (c.952G>A, p.Glu318Lys variant only), MLH1, MSH2, MSH3, MSH6, MUTYH, NBN, NF1, NF2, NTHL1, PALB2, PDGFRA, PHOX2B, PMS2, POLD1, POLE, POT1, PRKAR1A, PTCH1, PTEN, RAD50, RAD51C, RAD51D, RB1, RECQL4, RET, RUNX1, SDHAF2, SDHA (sequence changes only), SDHB, SDHC, SDHD, SMAD4, SMARCA4, SMARCB1, SMARCE1, STK11, SUFU, TERC, TERT, TMEM127, TP53, TSC1, TSC2, VHL, WRN and WT1.     CURRENT THERAPY:Keytruda, Carbo, Taxol  INTERVAL HISTORY: Leslie Wright 55 y.o. female returns for evaluation prior to receiving her next chemotherapy treatment.  She notes that she is tolerating the treatment quite well.  She has no new concerns today.  In particular she is not experiencing any peripheral neuropathy.  She says that she saw Dr. Barry Dienes earlier this week and noted her tumor is decreasing in size.  Leslie Wright also had some folliculitis last week and has a couple more days of Augmentin.  She says the areas are much improved from prior.     Patient Active Problem List   Diagnosis Date Noted   Port-A-Cath in place 03/09/2021   Encounter for antineoplastic chemotherapy 02/17/2021   Encounter for immunotherapy 02/17/2021   Genetic testing 02/14/2021   Hepatic steatosis 02/09/2021   Multilevel degenerative disc disease 02/09/2021   Family history of breast cancer    Family history of pancreatic cancer    Family history of colon cancer    Family history of lung cancer    Family history of prostate cancer    Family history of brain cancer    Malignant neoplasm of overlapping sites of left breast in female, estrogen receptor negative ([place]) 01/25/2021   Malignant neoplasm of lower-outer quadrant of right breast of female, estrogen receptor positive ([place]) 01/23/2021   Status post left hip replacement 05/19/2018    Acute deep vein thrombosis (DVT) of distal end of right lower extremity ([place]) 05/19/2018   Unilateral primary osteoarthritis, left hip 03/25/2018   Status post total replacement of left hip 03/25/2018   Hyperlipidemia 04/11/2017   Class 3 obesity with serious comorbidity and body mass index (BMI) of 40.0 to 44.9 in adult 04/11/2017   Prediabetes 02/04/2017   Mixed hyperlipidemia 01/09/2017   Insulin resistance 11/27/2016   Essential hypertension 10/25/2016   Morbid obesity ([place]) 10/25/2016   Vitamin D deficiency 10/25/2016    has No Known Allergies.  MEDICAL HISTORY: Past Medical History:  Diagnosis Date   Acid reflux    Anemia    Arthritis    "maybe in my fingers" (03/26/2018)   Breast cancer ([place]) 12/2020   both sides   Concussion 2001   no deficit had tia right after no problems since   Family history of adverse reaction to anesthesia    "brother, Otila [place], and my mom always get sick" (03/26/2018)   Family history of brain cancer    Family history of breast cancer    Family history of colon cancer    Family history of lung cancer    Family history of pancreatic cancer    Family history of prostate cancer    Headache    High blood pressure    High cholesterol    Hip pain    "left; before OR" (03/26/2018)   History of chemotherapy last done 02-17-2021   History of hiatal hernia  History of kidney stones    OSA (obstructive sleep apnea)    "can't tolerate the mask" (03/26/2018)    PONV (postoperative nausea and vomiting)    does not need much anesthesia, slow to awlaekn in past   Pre-diabetes    Shortness of breath    with exertion   Wears partial dentures upper    SURGICAL HISTORY: Past Surgical History:  Procedure Laterality Date   CARPAL TUNNEL RELEASE Right ~ 2006   JOINT REPLACEMENT     LAPAROSCOPIC CHOLECYSTECTOMY  12/2010   LASIK Bilateral 2000   PORT A CATH REVISION Left 02/21/2021   Procedure: PORT A CATH REVISION;  Surgeon: Stark Klein, MD;  Location:  [facility];  Service: General;  Laterality: Left;  60 MINUTES ROOM 5   PORTACATH PLACEMENT N/A 02/08/2021   Procedure: INSERTION PORT-A-CATH;  Surgeon: Stark Klein, MD;  Location: [facility];  Service: General;  Laterality: N/A;   TOTAL HIP ARTHROPLASTY Left 03/25/2018   Procedure: LEFT TOTAL HIP ARTHROPLASTY ANTERIOR APPROACH;  Surgeon: Mcarthur Rossetti, MD;  Location: [facility];  Service: Orthopedics;  Laterality: Left;    SOCIAL HISTORY: Social History   Socioeconomic History   Marital status: Single    Spouse name: Not on file   Number of children: Not on file   Years of education: Not on file   Highest education level: Not on file  Occupational History   Occupation: Orthoptist    Employer: [employer]  Tobacco Use   Smoking status: Former    Pack years: 0.00    Types: Cigarettes   Smokeless tobacco: Never   Tobacco comments:    In teens social  Vaping Use   Vaping Use: Never used  Substance and Sexual Activity   Alcohol use: Yes    Comment: Occasional   Drug use: Never   Sexual activity: Not Currently    Birth control/protection: Post-menopausal  Other Topics Concern   Not on file  Social History Narrative   Not on file   Social Determinants of Health   Financial Resource Strain: Not on file  Food Insecurity: Not on file  Transportation Needs: Not on file  Physical Activity: Not on file  Stress: Not on file  Social Connections: Not on file  Intimate Partner Violence: Not on file    FAMILY HISTORY: Family History  Problem Relation Age of Onset   Diabetes Mother    Thyroid disease Mother    Liver disease Mother    Obesity Mother    Hypertension Father    Hyperlipidemia Father    Heart disease Father    Stroke Father    Obesity Father    Colon polyps Father    Prostate cancer Brother    Brain cancer Maternal Grandfather    Pancreatic cancer Paternal Grandmother    Colon cancer Neg Hx        No one in family with colon cancer.     Review of Systems  Constitutional:  Negative for appetite change, chills, fatigue, fever and unexpected weight change.  HENT:   Negative for hearing loss, lump/mass and trouble swallowing.   Eyes:  Negative for eye problems and icterus.  Respiratory:  Negative for chest tightness, cough and shortness of breath.   Cardiovascular:  Negative for chest pain, leg swelling and palpitations.  Gastrointestinal:  Negative for abdominal distention, abdominal pain, constipation, diarrhea, nausea and vomiting.  Endocrine: Negative for hot flashes.  Genitourinary:  Negative for difficulty urinating.  Musculoskeletal:  Negative for arthralgias.  Skin:  Negative for itching and rash.  Neurological:  Negative for dizziness, extremity weakness, headaches and numbness.  Hematological:  Negative for adenopathy. Does not bruise/bleed easily.  Psychiatric/Behavioral:  Negative for depression. The patient is not nervous/anxious.      PHYSICAL EXAMINATION  ECOG PERFORMANCE STATUS: 1 - Symptomatic but completely ambulatory  Vitals:   03/16/21 0940  BP: (!) 142/45  Pulse: 72  Resp: 18  Temp: 97.6 F (36.4 C)  SpO2: 100%    Physical Exam Constitutional:      General: She is not in acute distress.    Appearance: Normal appearance. She is not toxic-appearing.  HENT:     Head: Normocephalic and atraumatic.  Eyes:     General: No scleral icterus. Cardiovascular:     Rate and Rhythm: Normal rate and regular rhythm.     Pulses: Normal pulses.     Heart sounds: Normal heart sounds.  Pulmonary:     Effort: Pulmonary effort is normal.     Breath sounds: Normal breath sounds.  Abdominal:     General: Abdomen is flat. Bowel sounds are normal. There is no distension.     Palpations: Abdomen is soft.     Tenderness: There is no abdominal tenderness.  Musculoskeletal:        General: No swelling.     Cervical back: Neck supple.  Lymphadenopathy:     Cervical: No cervical adenopathy.  Skin:     General: Skin is warm and dry.     Findings: No rash.     Comments: Two areas of infected hair follicles improved, mildly erythematous and swollen.    Neurological:     General: No focal deficit present.     Mental Status: She is alert.  Psychiatric:        Mood and Affect: Mood normal.        Behavior: Behavior normal.    LABORATORY DATA:  CBC    Component Value Date/Time   WBC 6.2 03/16/2021 0843   RBC 3.30 (L) 03/16/2021 0843   HGB 9.7 (L) 03/16/2021 0843   HGB 13.0 01/24/2021 1611   HGB 13.4 09/27/2016 1342   HCT 29.1 (L) 03/16/2021 0843   HCT 40.9 09/27/2016 1342   PLT 166 03/16/2021 0843   PLT 235 01/24/2021 1611   MCV 88.2 03/16/2021 0843   MCV 83 09/27/2016 1342   MCH 29.4 03/16/2021 0843   MCHC 33.3 03/16/2021 0843   RDW 17.5 (H) 03/16/2021 0843   RDW 15.9 (H) 09/27/2016 1342   LYMPHSABS 3.0 03/16/2021 0843   LYMPHSABS 2.3 09/27/2016 1342   MONOABS 0.5 03/16/2021 0843   EOSABS 0.1 03/16/2021 0843   EOSABS 0.6 (H) 09/27/2016 1342   BASOSABS 0.1 03/16/2021 0843   BASOSABS 0.0 09/27/2016 1342    CMP     Component Value Date/Time   NA 141 03/16/2021 0850   NA 143 12/02/2018 1138   K 3.6 03/16/2021 0850   CL 109 03/16/2021 0850   CO2 26 03/16/2021 0850   GLUCOSE 109 (H) 03/16/2021 0850   BUN 20 03/16/2021 0850   BUN 17 12/02/2018 1138   CREATININE 1.31 (H) 03/16/2021 0850   CREATININE 1.09 (H) 02/18/2019 1524   CALCIUM 8.8 (L) 03/16/2021 0850   PROT 6.5 03/16/2021 0850   PROT 6.9 12/02/2018 1138   ALBUMIN 3.6 03/16/2021 0850   ALBUMIN 4.0 12/02/2018 1138   AST 19 03/16/2021 0850   ALT 28 03/16/2021 0850  ALKPHOS 60 03/16/2021 0850   BILITOT <0.1 (L) 03/16/2021 0850   GFRNONAA 48 (L) 03/16/2021 0850   GFRAA 70 12/02/2018 1138           ASSESSMENT and THERAPY PLAN:   Malignant neoplasm of lower-outer quadrant of right breast of female, estrogen receptor positive ([facility]) palpable left breast lump. She underwent bilateral diagnostic  mammography with tomography and bilateral breast ultrasonography at [facility] on 01/12/2021 showing: breast density category C; 5.8 cm mass in the left breast at 12:30 with associated calcifications; additional 1.2 cm group of calcifications in the outer left breast;    On the right: Multiple small masses and irregular ducts in the retroareolar right breast, including a 0.8 cm representative mass at 6 o'clock; indeterminate 0.4 cm mass in upper-inner right breast; normal lymph nodes bilaterally.   Accordingly on 01/16/2021 she proceeded to biopsy of the right breast areas in question. The pathology from this procedure ([id number]) showed:  1. Right Breast, 6 o'clock             - invasive ductal carcinoma, grade 2. Prognostic indicators significant for: estrogen receptor, 90% positive with strong staining intensity and progesterone receptor, 0% negative. Proliferation marker Ki67 at 5%. HER2 equivocal by immunohistochemistry (2+), but negative by fluorescent in situ hybridization with a signals ratio 1.61 and number per cell 2.5. 2. Right Breast, 9 o'clock             - fibrocystic change with adenosis, calcifications, and a small intraductal papilloma.   She underwent biopsy of the left breast areas on 01/20/2021. Pathology [id number]) revealed: 1. Left Breast, upper-outer quadrant             - invasive ductal carcinoma, grade 3 2. Left Breast, central, slightly lateral to midline             - ductal carcinoma in situ with necrosis and calcifications, intermediate grade Prognostic panels are pending on both samples. -------------------------------------------------------------------------------------------------------------------------------------------- Current Treatment: Cycle 3 Taxol (carbo and Pembro q 3 weeks) Toxicities: 1. Follicultis: completing oral antibiotics.  I sent in some Clindagel for her to apply and recommended some warm compresses to the area.    She is tolerating her  treatment well.  She has no peripheral neuropathy which we are monitoring her closely for.    We will see Leslie Wright back in one week for labs and treatment, and in 2 weeks for labs, f/u, and treatment.  She knows to call for any questions that may arise between now and her next appointment.  We are happy to see her sooner if needed.   Orders Placed This Encounter  Procedures   CMP ([facility] only)   T4, free    Standing Status:   Standing    Number of Occurrences:   10    Standing Expiration Date:   03/16/2022    All questions were answered. The patient knows to call the clinic with any problems, questions or concerns. We can certainly see the patient much sooner if necessary.  Total encounter time: 30 minutes of face to face visit time, order entry and documentation of the encounter.  Leslie Bihari, NP 03/17/21 12:07 AM Medical Oncology and Hematology [facility] [address], [place] [address] Tel. [phone]    Fax. [phone]  *Total Encounter Time as defined by the Centers for Medicare and Medicaid Services includes, in addition to the face-to-face time of a patient visit (documented in the note above) non-face-to-face  time: obtaining and reviewing outside history, ordering and reviewing medications, tests or procedures, care coordination (communications with other health care professionals or caregivers) and documentation in the medical record.

## 2021-03-16 NOTE — Patient Instructions (Signed)
[facility] ONCOLOGY  Discharge Instructions: Thank you for choosing [facility] to provide your oncology and hematology care.   If you have a lab appointment with the [facility], please go directly to the [facility] and check in at the registration area.   Wear comfortable clothing and clothing appropriate for easy access to any Portacath or PICC line.   We strive to give you quality time with your provider. You may need to reschedule your appointment if you arrive late (15 or more minutes).  Arriving late affects you and other patients whose appointments are after yours.  Also, if you miss three or more appointments without notifying the office, you may be dismissed from the clinic at the provider's discretion.      For prescription refill requests, have your pharmacy contact our office and allow 72 hours for refills to be completed.    Today you received the following chemotherapy and/or immunotherapy agents: Taxol.      To help prevent nausea and vomiting after your treatment, we encourage you to take your nausea medication as directed.  BELOW ARE SYMPTOMS THAT SHOULD BE REPORTED IMMEDIATELY: *FEVER GREATER THAN 100.4 F (38 C) OR HIGHER *CHILLS OR SWEATING *NAUSEA AND VOMITING THAT IS NOT CONTROLLED WITH YOUR NAUSEA MEDICATION *UNUSUAL SHORTNESS OF BREATH *UNUSUAL BRUISING OR BLEEDING *URINARY PROBLEMS (pain or burning when urinating, or frequent urination) *BOWEL PROBLEMS (unusual diarrhea, constipation, pain near the anus) TENDERNESS IN MOUTH AND THROAT WITH OR WITHOUT PRESENCE OF ULCERS (sore throat, sores in mouth, or a toothache) UNUSUAL RASH, SWELLING OR PAIN  UNUSUAL VAGINAL DISCHARGE OR ITCHING   Items with * indicate a potential emergency and should be followed up as soon as possible or go to the Emergency Department if any problems should occur.  Please show the CHEMOTHERAPY ALERT CARD or IMMUNOTHERAPY ALERT CARD at check-in to the  Emergency Department and triage nurse.  Should you have questions after your visit or need to cancel or reschedule your appointment, please contact [facility]  Dept: [phone]  and follow the prompts.  Office hours are 8:00 a.m. to 4:30 p.m. Monday - Friday. Please note that voicemails left after 4:00 p.m. may not be returned until the following business day.  We are closed weekends and major holidays. You have access to a nurse at all times for urgent questions. Please call the main number to the clinic Dept: [phone] and follow the prompts.   For any non-urgent questions, you may also contact your provider using MyChart. We now offer e-Visits for anyone 25 and older to request care online for non-urgent symptoms. For details visit [email or web address].   Also download the MyChart app! Go to the app store, search "MyChart", open the app, select [facility], and log in with your MyChart username and password.  Due to Covid, a mask is required upon entering the hospital/clinic. If you do not have a mask, one will be given to you upon arrival. For doctor visits, patients may have 1 support person aged 65 or older with them. For treatment visits, patients cannot have anyone with them due to current Covid guidelines and our immunocompromised population.

## 2021-03-16 NOTE — Patient Instructions (Signed)

## 2021-03-16 NOTE — Assessment & Plan Note (Addendum)
palpable left breast lump. She underwent bilateral diagnostic mammography with tomography and bilateral breast ultrasonography at [facility] on 01/12/2021 showing: breast density category C; 5.8 cm mass in the left breast at 12:30 with associated calcifications; additional 1.2 cm group of calcifications in the outer left breast;   On the right: Multiple small masses and irregular ducts in the retroareolar right breast, including a 0.8 cm representative mass at 6 o'clock; indeterminate 0.4 cm mass in upper-inner right breast; normal lymph nodes bilaterally.  Accordingly on 01/16/2021 she proceeded to biopsy of the right breast areas in question. The pathology from this procedure ([id number]) showed:  1. Right Breast, 6 o'clock - invasive ductal carcinoma, grade 2. Prognostic indicators significant for: estrogen receptor, 90% positive with strong staining intensity and progesterone receptor, 0% negative. Proliferation marker Ki67 at 5%. HER2 equivocal by immunohistochemistry (2+), but negative by fluorescent in situ hybridization with a signals ratio 1.61 and number per cell 2.5. 2. Right Breast, 9 o'clock - fibrocystic change with adenosis, calcifications, and a small intraductal papilloma.  She underwent biopsy of the left breast areas on 01/20/2021. Pathology [id number]) revealed: 1. Left Breast, upper-outer quadrant - invasive ductal carcinoma, grade 3 2. Left Breast, central, slightly lateral to midline - ductal carcinoma in situ with necrosis and calcifications, intermediate grade Prognostic panels are pending on both samples. -------------------------------------------------------------------------------------------------------------------------------------------- Current Treatment: Cycle 3 Taxol (carbo and Pembro q 3 weeks) Toxicities: 1. Follicultis: completing oral antibiotics.  I sent in some Clindagel for her to apply and  recommended some warm compresses to the area.    She is tolerating her treatment well.  She has no peripheral neuropathy which we are monitoring her closely for.    We will see Zigmund Daniel back in one week for labs and treatment, and in 2 weeks for labs, f/u, and treatment.  She knows to call for any questions that may arise between now and her next appointment.  We are happy to see her sooner if needed.

## 2021-03-17 ENCOUNTER — Other Ambulatory Visit: Payer: 59

## 2021-03-17 ENCOUNTER — Encounter: Payer: Self-pay | Admitting: Adult Health

## 2021-03-17 ENCOUNTER — Ambulatory Visit: Payer: 59

## 2021-03-17 ENCOUNTER — Encounter: Payer: Self-pay | Admitting: Hematology and Oncology

## 2021-03-17 ENCOUNTER — Ambulatory Visit: Payer: 59 | Admitting: Adult Health

## 2021-03-17 ENCOUNTER — Other Ambulatory Visit (HOSPITAL_COMMUNITY): Payer: Self-pay

## 2021-03-17 ENCOUNTER — Encounter: Payer: Self-pay | Admitting: Oncology

## 2021-03-22 ENCOUNTER — Telehealth: Payer: Self-pay | Admitting: *Deleted

## 2021-03-22 NOTE — Telephone Encounter (Signed)
12:41 pm Reached Leslie Wright for clarification of start date June 2nd and included June 6th as flair up or recovery day or June 6th as start date.  "March 02, 2021 is the start date; I do not need June 6th."  Correction will be made upon receipt from provider.  Advised including date of birth for correct patient identification.  Correct forms e-mail ([email or web address]) provided for communications regarding forms.  [email or web address] is a group address.   No access or monitoring by others of an individual employee work e-mail address.  Staff out of work for various reasons and durations is reason use is not a best practice.   Eg.: Messages left during this nurse scheduled eleven-day, out of work time beginning in 48-hours will remain unread or acted on until return eleven days out.

## 2021-03-22 NOTE — Telephone Encounter (Signed)
Received the following patient e-mail sent 03/22/2021 at 10:45 am.   [employer] FMLA  Leslie Wright To: [place], [place], [place]  I just spoke to you. I gave wrong DOS to start.. June 2nd was chemo day.. June 6th I had to call in. Had bad cyst infection from chemo.  Thank you Leslie Wright, [facility] Special[facility] [place]/[employer]

## 2021-03-22 NOTE — Telephone Encounter (Signed)
10:25 am  This nurse connected with Ladona Ridgel [phone]) requesting start and end dates and which category type(s) of FMLA leave.  Completed form awaiting provider signature.

## 2021-03-22 NOTE — Progress Notes (Signed)
[place]  Telephone:(336) [phone] Fax:(336) [phone]     ID: Leslie [place] DOB: June 23, 1966  MR#: [id number]  ENM#:[id number]  Patient Care Team: Leslie Bien, MD as PCP - General (Family Medicine) Leslie Commons, MD (Inactive) as PCP - Cardiology (Cardiology) Leslie Kaufmann, RN as Oncology Nurse Navigator Leslie Germany, RN as Oncology Nurse Navigator Leslie Klein, MD as Consulting Physician (General Surgery) Leslie Rudd, MD as Consulting Physician (Radiation Oncology) Leslie Cruel, MD OTHER MD:  CHIEF COMPLAINT: Bilateral breast cancers  CURRENT TREATMENT: Neoadjuvant chemotherapy   INTERVAL HISTORY: Leslie Wright returns today for follow up and treatment of her bilateral breast cancers.   She began neoadjuvant chemotherapy, consisting of pembrolizumab, carboplatin, and paclitaxel, on 02/10/2021. Today is day 1 cycle 3.  We are following her TSH: Results for [name], [name]" (MRN [id number]) as of 03/23/2021 08:08  Ref. Range 09/27/2016 13:42 02/17/2021 08:15 02/23/2021 10:57 03/02/2021 09:01  TSH Latest Ref Range: 0.308 - 3.960 uIU/mL 1.890 2.733 1.316 1.527     REVIEW OF SYSTEMS: Leslie Wright is tolerating treatment remarkably well.  She denies peripheral neuropathy and she has had no problems with bleeding rash or diarrhea.  She has had no intercurrent fevers.  She does not have significant bony aches from the growth factor.  She tells me the mass is smaller but still palpable.  A detailed review of systems today was otherwise stable   COVID 19 VACCINATION STATUS: Status post [place] x2, no booster as of 01/24/2021   HISTORY OF CURRENT ILLNESS: From the original intake note:  [name]" presented with a palpable left breast lump. She underwent bilateral diagnostic mammography with tomography and bilateral breast ultrasonography at [facility] on 01/12/2021 showing: breast density category C; 5.8 cm mass in the left breast at 12:30 with  associated calcifications; additional 1.2 cm group of calcifications in the outer left breast;   On the right: Multiple small masses and irregular ducts in the retroareolar right breast, including a 0.8 cm representative mass at 6 o'clock; indeterminate 0.4 cm mass in upper-inner right breast; normal lymph nodes bilaterally.  Accordingly on 01/16/2021 she proceeded to biopsy of the right breast areas in question. The pathology from this procedure ([id number]) showed:  1. Right Breast, 6 o'clock  - invasive ductal carcinoma, grade 2. Prognostic indicators significant for: estrogen receptor, 90% positive with strong staining intensity and progesterone receptor, 0% negative. Proliferation marker Ki67 at 5%. HER2 equivocal by immunohistochemistry (2+), but negative by fluorescent in situ hybridization with a signals ratio 1.61 and number per cell 2.5. 2. Right Breast, 9 o'clock  - fibrocystic change with adenosis, calcifications, and a small intraductal papilloma.  She underwent biopsy of the left breast areas on 01/20/2021. Pathology [id number]) revealed: 1. Left Breast, upper-outer quadrant  - invasive ductal carcinoma, grade 3 2. Left Breast, central, slightly lateral to midline  - ductal carcinoma in situ with necrosis and calcifications, intermediate grade Prognostic panels are pending on both samples.  Cancer Staging Malignant neoplasm of lower-outer quadrant of right breast of female, estrogen receptor positive ([place]) Staging form: Breast, AJCC 8th Edition - Clinical stage from 01/20/2021: Stage IA (cT1c, cN0, cM0, G2, ER+, PR-, HER2-) - Signed by Leslie Cruel, MD on 01/25/2021 Stage prefix: Initial diagnosis Histologic grading system: 3 grade system  Malignant neoplasm of overlapping sites of left breast in female, estrogen receptor negative ([place]) Staging form: Breast, AJCC 8th Edition - Clinical stage from 01/20/2021: Stage IIIB (cT3,  cN0, cM0, G3, ER-, PR-, HER2-) - Signed by  Leslie Cruel, MD on 01/25/2021 Histologic grading system: 3 grade system  The patient's subsequent history is as detailed below.   PAST MEDICAL HISTORY: Past Medical History:  Diagnosis Date   Acid reflux    Anemia    Arthritis    "maybe in my fingers" (03/26/2018)   Breast cancer ([place]) 12-Feb-2021   both sides   Concussion 2001   no deficit had tia right after no problems since   Family history of adverse reaction to anesthesia    "brother, Timmy, and my mom always get sick" (03/26/2018)   Family history of brain cancer    Family history of breast cancer    Family history of colon cancer    Family history of lung cancer    Family history of pancreatic cancer    Family history of prostate cancer    Headache    High blood pressure    High cholesterol    Hip pain    "left; before OR" (03/26/2018)   History of chemotherapy last done 02-17-2021   History of hiatal hernia    History of kidney stones    OSA (obstructive sleep apnea)    "can't tolerate the mask" (03/26/2018)    PONV (postoperative nausea and vomiting)    does not need much anesthesia, slow to awlaekn in past   Pre-diabetes    Shortness of breath    with exertion   Wears partial dentures upper    PAST SURGICAL HISTORY: Past Surgical History:  Procedure Laterality Date   CARPAL TUNNEL RELEASE Right ~ 2006   JOINT REPLACEMENT     LAPAROSCOPIC CHOLECYSTECTOMY  2011/02/13   LASIK Bilateral 2000   PORT A CATH REVISION Left 02/21/2021   Procedure: PORT A CATH REVISION;  Surgeon: Leslie Klein, MD;  Location: [facility];  Service: General;  Laterality: Left;  60 MINUTES ROOM 5   PORTACATH PLACEMENT N/A 02/08/2021   Procedure: INSERTION PORT-A-CATH;  Surgeon: Leslie Klein, MD;  Location: [facility];  Service: General;  Laterality: N/A;   TOTAL HIP ARTHROPLASTY Left 03/25/2018   Procedure: LEFT TOTAL HIP ARTHROPLASTY ANTERIOR APPROACH;  Surgeon: Mcarthur Rossetti, MD;  Location: [facility];  Service:  Orthopedics;  Laterality: Left;    FAMILY HISTORY: Family History  Problem Relation Age of Onset   Diabetes Mother    Thyroid disease Mother    Liver disease Mother    Obesity Mother    Hypertension Father    Hyperlipidemia Father    Heart disease Father    Stroke Father    Obesity Father    Colon polyps Father    Prostate cancer Brother    Brain cancer Maternal Grandfather    Pancreatic cancer Paternal Grandmother    Colon cancer Neg Hx        No one in family with colon cancer.  The patient's father is 49 years old as of 02/12/2021.  The patient's mother died from cirrhosis associated with hepatitis C (from a transfusion) age 8.  The patient has 3 brothers, no sisters.  1 brother has a history of prostate cancer at age 27.  A maternal grandfather had a history of central nervous system carcinoma.  A paternal grandmother had a history of pancreatic cancer and a maternal aunt had a history of breast cancer in her late 76s.   GYNECOLOGIC HISTORY:  No LMP recorded (lmp unknown). Patient is postmenopausal. Menarche: 55 years  old [place] P 0 LMP 53 HRT no  Hysterectomy? no BSO? no   SOCIAL HISTORY: (updated 12/2020)  Leslie Ferries "Leslie [place]" works for [employer] in [employer].  She works from home.  She is a Engineer, mining.  She describes herself as single, lives by herself, with a toy poodle and a [place] as well as 2 cats.  ADVANCED DIRECTIVES: The patient's brother Valta Dillon is her healthcare power of attorney.  He can be reached at [phone]   HEALTH MAINTENANCE: Social History   Tobacco Use   Smoking status: Former    Pack years: 0.00    Types: Cigarettes   Smokeless tobacco: Never   Tobacco comments:    In teens social  Vaping Use   Vaping Use: Never used  Substance Use Topics   Alcohol use: Yes    Comment: Occasional   Drug use: Never     Colonoscopy:   PAP: 03/2011  Bone density: scheduled for 07/2021   No Known Allergies  Current Outpatient Medications   Medication Sig Dispense Refill   acetaminophen (TYLENOL) 500 MG tablet Take 1,000 mg by mouth every 6 (six) hours as needed for moderate pain.     amoxicillin-clavulanate (AUGMENTIN) 875-125 MG tablet Take 1 tablet by mouth 2 (two) times daily. 14 tablet 0   aspirin EC 81 MG tablet Take 81 mg by mouth daily.     betamethasone valerate ointment (VALISONE) 0.1 % Apply 1 application topically 2 (two) times daily. 30 g 0   Blood Glucose Monitoring Suppl (FREESTYLE LITE) w/Device KIT CHECK GLUCOSE 2-3 TIMES DAILY 1 kit 0   Cholecalciferol (VITAMIN D) 50 MCG (2000 UT) CAPS Take 2,000 Units by mouth daily.     clindamycin (CLINDAGEL) 1 % gel Apply topically 2 (two) times daily. 30 g 0   dexamethasone (DECADRON) 4 MG tablet Take 2 tablets once a day for 3 days after carboplatin and AC chemotherapy. Take with food. 30 tablet 1   ELDERBERRY PO Take 1 capsule by mouth daily.     folic acid (FOLVITE) 1 MG tablet Take 1 mg by mouth daily.     glucose blood test strip USE AS DIRECTED TO CHECK BLOOD SUGAR 1 TO 3 TIMES A DAY (Patient taking differently: USE AS DIRECTED TO CHECK BLOOD SUGAR 1 TO 3 TIMES A DAY) 100 strip 3   ketoconazole (NIZORAL) 2 % cream Apply 1 application topically daily. (Patient taking differently: Apply 1 application topically daily as needed.) 15 g 0   Lancets (FREESTYLE) lancets USE AS DIRECTED TO CHECK BLOOD SUGAR 2 TO 3 TIMES A DAY (Patient taking differently: USE AS DIRECTED TO CHECK BLOOD SUGAR 2 TO 3 TIMES A DAY) 100 each 3   lidocaine-prilocaine (EMLA) cream Apply to affected area once 30 g 3   losartan (COZAAR) 100 MG tablet Take 1 tablet by mouth daily 90 tablet 3   methotrexate (RHEUMATREX) 15 MG tablet Take 15 mg by mouth every Wednesday. Caution: Chemotherapy. Protect from light.     Multiple Vitamin (MULTIVITAMIN WITH MINERALS) TABS tablet Take 1 tablet by mouth daily.     omeprazole (PRILOSEC) 20 MG capsule Take 20 mg by mouth daily.     ondansetron (ZOFRAN) 8 MG tablet  Take 1 tablet (8 mg total) by mouth 2 (two) times daily as needed. Start on the third day after carboplatin and AC chemotherapy. 30 tablet 1   prochlorperazine (COMPAZINE) 10 MG tablet Take 1 tablet (10 mg total) by mouth every 6 (six) hours  as needed (Nausea or vomiting). 30 tablet 1   rosuvastatin (CRESTOR) 10 MG tablet TAKE 1 TABLET BY MOUTH ONCE A DAY AT BEDTIME FOR CHOLESTEROL (Patient taking differently: Take 10 mg by mouth at bedtime.) 90 tablet 1   No current facility-administered medications for this visit.    OBJECTIVE: White woman who appears stated age  55:   03/23/21 0910  BP: 126/83  Pulse: 93  Resp: 18  Temp: (!) 97.3 F (36.3 C)  SpO2: 100%     Body mass index is 47.83 kg/m.   Wt Readings from Last 3 Encounters:  03/23/21 (!) 305 lb 6.4 oz (138.5 kg)  03/16/21 (!) 303 lb 12.8 oz (137.8 kg)  03/09/21 292 lb 14.4 oz (132.9 kg)      ECOG FS:1 - Symptomatic but completely ambulatory  Sclerae unicteric, EOMs intact Wearing a mask No cervical or supraclavicular adenopathy Lungs no rales or rhonchi Heart regular rate and rhythm Abd soft, nontender, positive bowel sounds MSK no focal spinal tenderness, no upper extremity lymphedema Neuro: nonfocal, well oriented, appropriate affect Breasts: The left breast mass is still palpable in the upper outer quadrant although it appears slightly softer and smaller.   LAB RESULTS:  CMP     Component Value Date/Time   NA 140 03/23/2021 0837   NA 143 12/02/2018 1138   K 4.1 03/23/2021 0837   CL 107 03/23/2021 0837   CO2 24 03/23/2021 0837   GLUCOSE 114 (H) 03/23/2021 0837   BUN 18 03/23/2021 0837   BUN 17 12/02/2018 1138   CREATININE 1.11 (H) 03/23/2021 0837   CREATININE 1.31 (H) 03/16/2021 0850   CREATININE 1.09 (H) 02/18/2019 1524   CALCIUM 8.9 03/23/2021 0837   PROT 6.4 (L) 03/23/2021 0837   PROT 6.9 12/02/2018 1138   ALBUMIN 3.2 (L) 03/23/2021 0837   ALBUMIN 4.0 12/02/2018 1138   AST 19 03/23/2021 0837    AST 19 03/16/2021 0850   ALT 26 03/23/2021 0837   ALT 28 03/16/2021 0850   ALKPHOS 67 03/23/2021 0837   BILITOT 0.3 03/23/2021 0837   BILITOT <0.1 (L) 03/16/2021 0850   GFRNONAA 58 (L) 03/23/2021 0837   GFRNONAA 48 (L) 03/16/2021 0850   GFRAA 70 12/02/2018 1138    No results found for: TOTALPROTELP, ALBUMINELP, A1GS, A2GS, BETS, BETA2SER, GAMS, MSPIKE, SPEI  Lab Results  Component Value Date   WBC 5.3 03/23/2021   NEUTROABS 3.4 03/23/2021   HGB 10.4 (L) 03/23/2021   HCT 30.5 (L) 03/23/2021   MCV 89.4 03/23/2021   PLT 183 03/23/2021    No results found for: LABCA2  No components found for: XQJJHE174  No results for input(s): INR in the last 168 hours.  No results found for: LABCA2  No results found for: YCX448  No results found for: JEH631  No results found for: SHF026  No results found for: CA2729  No components found for: HGQUANT  No results found for: CEA1 / No results found for: CEA1   No results found for: AFPTUMOR  No results found for: CHROMOGRNA  No results found for: KPAFRELGTCHN, LAMBDASER, KAPLAMBRATIO (kappa/lambda light chains)  No results found for: HGBA, HGBA2QUANT, HGBFQUANT, HGBSQUAN (Hemoglobinopathy evaluation)   No results found for: LDH  No results found for: IRON, TIBC, IRONPCTSAT (Iron and TIBC)  No results found for: FERRITIN  Urinalysis    Component Value Date/Time   COLORURINE YELLOW 10/23/2010 2031   APPEARANCEUR CLEAR 10/23/2010 2031   LABSPEC 1.025 10/23/2010 2031   PHURINE 6.0  10/23/2010 2031   GLUCOSEU NEGATIVE 03/12/2009 0843   HGBUR MODERATE (A) 10/23/2010 2031   BILIRUBINUR NEGATIVE 10/23/2010 2031   KETONESUR NEGATIVE 10/23/2010 2031   PROTEINUR TRACE (A) 10/23/2010 2031   UROBILINOGEN 0.2 10/23/2010 2031   NITRITE NEGATIVE 10/23/2010 2031   LEUKOCYTESUR NEGATIVE 10/23/2010 2031    STUDIES: DG CHEST PORT 1 VIEW  Result Date: 02/21/2021 CLINICAL DATA:  Difficulty breathing EXAM: PORTABLE CHEST 1 VIEW  COMPARISON:  02/10/2021 FINDINGS: Cardiac shadow is stable. Left chest wall port is noted with the catheter tip in the mid superior vena cava. No pneumothorax is noted. Lungs are clear. IMPRESSION: No pneumothorax following port revision. Catheter tip is noted in the mid superior vena cava in satisfactory position. Electronically Signed   By: Inez Catalina M.D.   On: 02/21/2021 15:33   DG C-Arm 1-60 Min-No Report  Result Date: 02/21/2021 Fluoroscopy was utilized by the requesting physician.  No radiographic interpretation.      ELIGIBLE FOR AVAILABLE RESEARCH PROTOCOL:   ASSESSMENT: 55 y.o. 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  (a) secondary biopsy shows ductal carcinoma in situ associated with extensive patchy enhancement  (3) staging studies:  (a) chest CT scan 02/06/2021 shows hepatic steatosis and degenerative disc disease, no evidence of metastatic disease  (b) bone scan 02/06/2021 shows no evidence of metastatic disease  (4) neoadjuvant chemotherapy will start 02/09/2021 consisting of pembrolizumab/carboplatin/paclitaxel followed by pembrolizumab/doxorubicin/cyclophosphamide  (5) definitive surgery to follow  (6) adjuvant radiation to bilateral sites  (7) genetics testing  Negative genetic testing. No pathogenic variants identified on the [facility] Multi-Cancer Panel+RNA. VUS in ATM  called c.1132A>G identified. The report date is 02/13/2021.   The Multi-Cancer Panel + RNA offered by [facility] includes sequencing and/or deletion duplication testing of the following 84 genes: AIP, ALK, APC, ATM, AXIN2,BAP1,  BARD1, BLM, BMPR1A, BRCA1, BRCA2, BRIP1, CASR, CDC73, CDH1, CDK4, CDKN1B, CDKN1C, CDKN2A (p14ARF), CDKN2A (p16INK4a), CEBPA, CHEK2, CTNNA1, DICER1, DIS3L2, EGFR (c.2369C>T, p.Thr790Met variant only), EPCAM (Deletion/duplication testing only), FH, FLCN, GATA2, GPC3, GREM1 (Promoter region deletion/duplication testing only), HOXB13 (c.251G>A, p.Gly84Glu), HRAS, KIT, MAX, MEN1, MET, MITF (c.952G>A, p.Glu318Lys variant only), MLH1, MSH2, MSH3, MSH6, MUTYH, NBN, NF1, NF2, NTHL1, PALB2, PDGFRA, PHOX2B, PMS2, POLD1, POLE, POT1, PRKAR1A, PTCH1, PTEN, RAD50, RAD51C, RAD51D, RB1, RECQL4, RET, RUNX1, SDHAF2, SDHA (sequence changes only), SDHB, SDHC, SDHD, SMAD4, SMARCA4, SMARCB1, SMARCE1, STK11, SUFU, TERC, TERT, TMEM127, TP53, TSC1, TSC2, VHL, WRN and WT1.    PLAN: Leslie Wright will begin the third cycle of chemotherapy today.  This is a very complex treatment protocol and she so far has been able to follow it without any dose reductions or delays.  She is tolerating it remarkably well.  Her Zarxio doses this cycle had been left out and I have added them.  So far she has not had any peripheral neuropathy symptoms.  I have alerted her to let us know if these develop at any point  Otherwise  she will see me again in 3weeks.  That will be the start of her fourth and final cycle of the current treatment.  At that point we will set her up for her final 4 cycles of which will be considerably simpler although perhaps more intense  Total encounter time 25 minutes.Sarajane Jews C. Negar Sieler, MD 03/23/2021 9:49 AM Medical Oncology and Hematology [facility] [place], [place] [address] Tel. [phone]    Fax. [phone]   This document serves as a record of services  personally performed by Lurline Del, MD. It was created on his behalf by Wilburn Mylar, a trained medical scribe. The creation of this record is based on the scribe's personal observations and the provider's statements to them.   I, Lurline Del MD, have reviewed the above documentation for accuracy and completeness, and I agree with the above.   *Total Encounter Time as defined by the Centers for Medicare and Medicaid Services includes, in addition to the face-to-face time of a patient visit (documented in the note above) non-face-to-face time: obtaining and reviewing outside history, ordering and reviewing medications, tests or procedures, care coordination (communications with other health care professionals or caregivers) and documentation in the medical record.

## 2021-03-23 ENCOUNTER — Inpatient Hospital Stay: Payer: 59

## 2021-03-23 ENCOUNTER — Inpatient Hospital Stay: Payer: 59 | Admitting: Oncology

## 2021-03-23 ENCOUNTER — Other Ambulatory Visit: Payer: Self-pay

## 2021-03-23 ENCOUNTER — Other Ambulatory Visit (HOSPITAL_COMMUNITY): Payer: Self-pay

## 2021-03-23 ENCOUNTER — Encounter: Payer: Self-pay | Admitting: *Deleted

## 2021-03-23 ENCOUNTER — Other Ambulatory Visit: Payer: 59

## 2021-03-23 VITALS — BP 126/83 | HR 93 | Temp 97.3°F | Resp 18 | Ht 67.0 in | Wt 305.4 lb

## 2021-03-23 DIAGNOSIS — Z171 Estrogen receptor negative status [ER-]: Secondary | ICD-10-CM

## 2021-03-23 DIAGNOSIS — M539 Dorsopathy, unspecified: Secondary | ICD-10-CM

## 2021-03-23 DIAGNOSIS — Z170 Estrogen receptor positive status [ER+]: Secondary | ICD-10-CM

## 2021-03-23 DIAGNOSIS — C50511 Malignant neoplasm of lower-outer quadrant of right female breast: Secondary | ICD-10-CM

## 2021-03-23 DIAGNOSIS — Z5112 Encounter for antineoplastic immunotherapy: Secondary | ICD-10-CM | POA: Diagnosis not present

## 2021-03-23 DIAGNOSIS — Z5189 Encounter for other specified aftercare: Secondary | ICD-10-CM | POA: Diagnosis not present

## 2021-03-23 DIAGNOSIS — C50812 Malignant neoplasm of overlapping sites of left female breast: Secondary | ICD-10-CM

## 2021-03-23 DIAGNOSIS — K760 Fatty (change of) liver, not elsewhere classified: Secondary | ICD-10-CM

## 2021-03-23 DIAGNOSIS — Z5111 Encounter for antineoplastic chemotherapy: Secondary | ICD-10-CM | POA: Diagnosis not present

## 2021-03-23 DIAGNOSIS — L739 Follicular disorder, unspecified: Secondary | ICD-10-CM

## 2021-03-23 DIAGNOSIS — Z79899 Other long term (current) drug therapy: Secondary | ICD-10-CM | POA: Diagnosis not present

## 2021-03-23 DIAGNOSIS — Z95828 Presence of other vascular implants and grafts: Secondary | ICD-10-CM

## 2021-03-23 LAB — COMPREHENSIVE METABOLIC PANEL WITH GFR
ALT: 26 U/L (ref 0–44)
AST: 19 U/L (ref 15–41)
Albumin: 3.2 g/dL — ABNORMAL LOW (ref 3.5–5.0)
Alkaline Phosphatase: 67 U/L (ref 38–126)
Anion gap: 9 (ref 5–15)
BUN: 18 mg/dL (ref 6–20)
CO2: 24 mmol/L (ref 22–32)
Calcium: 8.9 mg/dL (ref 8.9–10.3)
Chloride: 107 mmol/L (ref 98–111)
Creatinine, Ser: 1.11 mg/dL — ABNORMAL HIGH (ref 0.44–1.00)
GFR, Estimated: 58 mL/min — ABNORMAL LOW (ref >60)
Glucose, Bld: 114 mg/dL — ABNORMAL HIGH (ref 70–99)
Potassium: 4.1 mmol/L (ref 3.5–5.1)
Sodium: 140 mmol/L (ref 135–145)
Total Bilirubin: 0.3 mg/dL (ref 0.3–1.2)
Total Protein: 6.4 g/dL — ABNORMAL LOW (ref 6.5–8.1)

## 2021-03-23 LAB — CBC WITH DIFFERENTIAL/PLATELET
Abs Immature Granulocytes: 0.12 10*3/uL — ABNORMAL HIGH (ref 0.00–0.07)
Basophils Absolute: 0 10*3/uL (ref 0.0–0.1)
Basophils Relative: 0 %
Eosinophils Absolute: 0 10*3/uL (ref 0.0–0.5)
Eosinophils Relative: 0 %
HCT: 30.5 % — ABNORMAL LOW (ref 36.0–46.0)
Hemoglobin: 10.4 g/dL — ABNORMAL LOW (ref 12.0–15.0)
Immature Granulocytes: 2 %
Lymphocytes Relative: 24 %
Lymphs Abs: 1.3 10*3/uL (ref 0.7–4.0)
MCH: 30.5 pg (ref 26.0–34.0)
MCHC: 34.1 g/dL (ref 30.0–36.0)
MCV: 89.4 fL (ref 80.0–100.0)
Monocytes Absolute: 0.5 10*3/uL (ref 0.1–1.0)
Monocytes Relative: 9 %
Neutro Abs: 3.4 10*3/uL (ref 1.7–7.7)
Neutrophils Relative %: 65 %
Platelets: 183 10*3/uL (ref 150–400)
RBC: 3.41 MIL/uL — ABNORMAL LOW (ref 3.87–5.11)
RDW: 18.7 % — ABNORMAL HIGH (ref 11.5–15.5)
WBC: 5.3 10*3/uL (ref 4.0–10.5)
nRBC: 0.6 % — ABNORMAL HIGH (ref 0.0–0.2)

## 2021-03-23 LAB — T4, FREE: Free T4: 0.92 ng/dL (ref 0.61–1.12)

## 2021-03-23 LAB — TSH: TSH: 1.823 u[IU]/mL (ref 0.308–3.960)

## 2021-03-23 MED ORDER — FAMOTIDINE 20 MG IN NS 100 ML IVPB
20.0000 mg | Freq: Once | INTRAVENOUS | Status: AC
  Administered 2021-03-23: 20 mg via INTRAVENOUS

## 2021-03-23 MED ORDER — DIPHENHYDRAMINE HCL 50 MG/ML IJ SOLN
INTRAMUSCULAR | Status: AC
  Filled 2021-03-23: qty 1

## 2021-03-23 MED ORDER — PEMBROLIZUMAB CHEMO INJECTION 100 MG/4ML
200.0000 mg | Freq: Once | INTRAVENOUS | Status: AC
  Administered 2021-03-23: 200 mg via INTRAVENOUS
  Filled 2021-03-23: qty 8

## 2021-03-23 MED ORDER — SODIUM CHLORIDE 0.9 % IV SOLN
80.0000 mg/m2 | Freq: Once | INTRAVENOUS | Status: DC
  Filled 2021-03-23 (×2): qty 34

## 2021-03-23 MED ORDER — AMOXICILLIN-POT CLAVULANATE 875-125 MG PO TABS
1.0000 | ORAL_TABLET | Freq: Two times a day (BID) | ORAL | 0 refills | Status: DC
  Filled 2021-03-23: qty 14, 7d supply, fill #0

## 2021-03-23 MED ORDER — SODIUM CHLORIDE 0.9 % IV SOLN
610.0000 mg | Freq: Once | INTRAVENOUS | Status: AC
  Administered 2021-03-23: 610 mg via INTRAVENOUS
  Filled 2021-03-23: qty 61

## 2021-03-23 MED ORDER — SODIUM CHLORIDE 0.9 % IV SOLN
80.0000 mg/m2 | Freq: Once | INTRAVENOUS | Status: AC
  Administered 2021-03-23: 204 mg via INTRAVENOUS
  Filled 2021-03-23: qty 34

## 2021-03-23 MED ORDER — FAMOTIDINE 20 MG IN NS 100 ML IVPB
INTRAVENOUS | Status: AC
  Filled 2021-03-23: qty 100

## 2021-03-23 MED ORDER — SODIUM CHLORIDE 0.9% FLUSH
10.0000 mL | Freq: Once | INTRAVENOUS | Status: AC
Start: 2021-03-23 — End: 2021-03-23
  Administered 2021-03-23: 10 mL
  Filled 2021-03-23: qty 10

## 2021-03-23 MED ORDER — FOSAPREPITANT DIMEGLUMINE INJECTION 150 MG
150.0000 mg | Freq: Once | INTRAVENOUS | Status: AC
  Administered 2021-03-23: 150 mg via INTRAVENOUS
  Filled 2021-03-23: qty 150

## 2021-03-23 MED ORDER — DIPHENHYDRAMINE HCL 50 MG/ML IJ SOLN
25.0000 mg | Freq: Once | INTRAMUSCULAR | Status: AC
  Administered 2021-03-23: 25 mg via INTRAVENOUS

## 2021-03-23 MED ORDER — PALONOSETRON HCL INJECTION 0.25 MG/5ML
INTRAVENOUS | Status: AC
  Filled 2021-03-23: qty 5

## 2021-03-23 MED ORDER — PALONOSETRON HCL INJECTION 0.25 MG/5ML
0.2500 mg | Freq: Once | INTRAVENOUS | Status: AC
Start: 2021-03-23 — End: 2021-03-23
  Administered 2021-03-23: 0.25 mg via INTRAVENOUS

## 2021-03-23 MED ORDER — SODIUM CHLORIDE 0.9 % IV SOLN
Freq: Once | INTRAVENOUS | Status: AC
Start: 2021-03-23 — End: 2021-03-23
  Filled 2021-03-23: qty 250

## 2021-03-23 MED ORDER — DEXAMETHASONE SODIUM PHOSPHATE 100 MG/10ML IJ SOLN
10.0000 mg | Freq: Once | INTRAMUSCULAR | Status: AC
  Administered 2021-03-23: 10 mg via INTRAVENOUS
  Filled 2021-03-23: qty 10

## 2021-03-23 NOTE — Progress Notes (Signed)
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  Larene Beach, PharmD

## 2021-03-23 NOTE — Progress Notes (Deleted)
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  Larene Beach, PharmD

## 2021-03-24 ENCOUNTER — Other Ambulatory Visit: Payer: 59

## 2021-03-24 ENCOUNTER — Ambulatory Visit: Payer: 59

## 2021-03-30 ENCOUNTER — Other Ambulatory Visit: Payer: 59

## 2021-03-30 ENCOUNTER — Other Ambulatory Visit: Payer: Self-pay

## 2021-03-30 ENCOUNTER — Inpatient Hospital Stay: Payer: 59

## 2021-03-30 VITALS — BP 126/84 | HR 97 | Temp 98.4°F

## 2021-03-30 DIAGNOSIS — Z170 Estrogen receptor positive status [ER+]: Secondary | ICD-10-CM

## 2021-03-30 DIAGNOSIS — Z171 Estrogen receptor negative status [ER-]: Secondary | ICD-10-CM | POA: Diagnosis not present

## 2021-03-30 DIAGNOSIS — C50511 Malignant neoplasm of lower-outer quadrant of right female breast: Secondary | ICD-10-CM

## 2021-03-30 DIAGNOSIS — Z5112 Encounter for antineoplastic immunotherapy: Secondary | ICD-10-CM | POA: Diagnosis not present

## 2021-03-30 DIAGNOSIS — C50812 Malignant neoplasm of overlapping sites of left female breast: Secondary | ICD-10-CM

## 2021-03-30 DIAGNOSIS — Z5189 Encounter for other specified aftercare: Secondary | ICD-10-CM | POA: Diagnosis not present

## 2021-03-30 DIAGNOSIS — Z79899 Other long term (current) drug therapy: Secondary | ICD-10-CM | POA: Diagnosis not present

## 2021-03-30 DIAGNOSIS — Z5111 Encounter for antineoplastic chemotherapy: Secondary | ICD-10-CM | POA: Diagnosis not present

## 2021-03-30 LAB — CBC WITH DIFFERENTIAL/PLATELET
Abs Immature Granulocytes: 0.06 10*3/uL (ref 0.00–0.07)
Basophils Absolute: 0 10*3/uL (ref 0.0–0.1)
Basophils Relative: 0 %
Eosinophils Absolute: 0.1 10*3/uL (ref 0.0–0.5)
Eosinophils Relative: 1 %
HCT: 32 % — ABNORMAL LOW (ref 36.0–46.0)
Hemoglobin: 11 g/dL — ABNORMAL LOW (ref 12.0–15.0)
Immature Granulocytes: 1 %
Lymphocytes Relative: 42 %
Lymphs Abs: 2.7 10*3/uL (ref 0.7–4.0)
MCH: 30.3 pg (ref 26.0–34.0)
MCHC: 34.4 g/dL (ref 30.0–36.0)
MCV: 88.2 fL (ref 80.0–100.0)
Monocytes Absolute: 0.4 10*3/uL (ref 0.1–1.0)
Monocytes Relative: 6 %
Neutro Abs: 3.2 10*3/uL (ref 1.7–7.7)
Neutrophils Relative %: 50 %
Platelets: 173 10*3/uL (ref 150–400)
RBC: 3.63 MIL/uL — ABNORMAL LOW (ref 3.87–5.11)
RDW: 18.9 % — ABNORMAL HIGH (ref 11.5–15.5)
WBC: 6.5 10*3/uL (ref 4.0–10.5)
nRBC: 0 % (ref 0.0–0.2)

## 2021-03-30 LAB — COMPREHENSIVE METABOLIC PANEL
ALT: 18 U/L (ref 0–44)
AST: 12 U/L — ABNORMAL LOW (ref 15–41)
Albumin: 3.3 g/dL — ABNORMAL LOW (ref 3.5–5.0)
Alkaline Phosphatase: 62 U/L (ref 38–126)
Anion gap: 11 (ref 5–15)
BUN: 25 mg/dL — ABNORMAL HIGH (ref 6–20)
CO2: 26 mmol/L (ref 22–32)
Calcium: 8.6 mg/dL — ABNORMAL LOW (ref 8.9–10.3)
Chloride: 104 mmol/L (ref 98–111)
Creatinine, Ser: 1.2 mg/dL — ABNORMAL HIGH (ref 0.44–1.00)
GFR, Estimated: 53 mL/min — ABNORMAL LOW (ref >60)
Glucose, Bld: 100 mg/dL — ABNORMAL HIGH (ref 70–99)
Potassium: 4.1 mmol/L (ref 3.5–5.1)
Sodium: 141 mmol/L (ref 135–145)
Total Bilirubin: 0.3 mg/dL (ref 0.3–1.2)
Total Protein: 6.6 g/dL (ref 6.5–8.1)

## 2021-03-30 MED ORDER — DIPHENHYDRAMINE HCL 50 MG/ML IJ SOLN
25.0000 mg | Freq: Once | INTRAMUSCULAR | Status: AC
  Administered 2021-03-30: 25 mg via INTRAVENOUS

## 2021-03-30 MED ORDER — SODIUM CHLORIDE 0.9 % IV SOLN
10.0000 mg | Freq: Once | INTRAVENOUS | Status: AC
  Administered 2021-03-30: 10 mg via INTRAVENOUS
  Filled 2021-03-30: qty 10

## 2021-03-30 MED ORDER — FAMOTIDINE 20 MG IN NS 100 ML IVPB
20.0000 mg | Freq: Once | INTRAVENOUS | Status: AC
  Administered 2021-03-30: 20 mg via INTRAVENOUS

## 2021-03-30 MED ORDER — SODIUM CHLORIDE 0.9 % IV SOLN
80.0000 mg/m2 | Freq: Once | INTRAVENOUS | Status: AC
  Administered 2021-03-30: 204 mg via INTRAVENOUS
  Filled 2021-03-30: qty 34

## 2021-03-30 MED ORDER — SODIUM CHLORIDE 0.9% FLUSH
10.0000 mL | INTRAVENOUS | Status: DC | PRN
  Administered 2021-03-30: 10 mL
  Filled 2021-03-30: qty 10

## 2021-03-30 MED ORDER — DIPHENHYDRAMINE HCL 50 MG/ML IJ SOLN
INTRAMUSCULAR | Status: AC
  Filled 2021-03-30: qty 1

## 2021-03-30 MED ORDER — FAMOTIDINE 20 MG IN NS 100 ML IVPB
INTRAVENOUS | Status: AC
  Filled 2021-03-30: qty 100

## 2021-03-30 MED ORDER — SODIUM CHLORIDE 0.9 % IV SOLN
Freq: Once | INTRAVENOUS | Status: AC
  Filled 2021-03-30: qty 250

## 2021-03-30 MED ORDER — HEPARIN SOD (PORK) LOCK FLUSH 100 UNIT/ML IV SOLN
500.0000 [IU] | Freq: Once | INTRAVENOUS | Status: AC | PRN
  Administered 2021-03-30: 500 [IU]
  Filled 2021-03-30: qty 5

## 2021-03-30 NOTE — Patient Instructions (Signed)
[facility]  Discharge Instructions: Thank you for choosing [facility] to provide your oncology and hematology care.   If you have a lab appointment with the [facility], please go directly to the [facility] and check in at the registration area.   Wear comfortable clothing and clothing appropriate for easy access to any Portacath or PICC line.   We strive to give you quality time with your provider. You may need to reschedule your appointment if you arrive late (15 or more minutes).  Arriving late affects you and other patients whose appointments are after yours.  Also, if you miss three or more appointments without notifying the office, you may be dismissed from the clinic at the provider's discretion.      For prescription refill requests, have your pharmacy contact our office and allow 72 hours for refills to be completed.    Today you received the following chemotherapy and/or immunotherapy agents: Taxol    To help prevent nausea and vomiting after your treatment, we encourage you to take your nausea medication as directed.  BELOW ARE SYMPTOMS THAT SHOULD BE REPORTED IMMEDIATELY: *FEVER GREATER THAN 100.4 F (38 C) OR HIGHER *CHILLS OR SWEATING *NAUSEA AND VOMITING THAT IS NOT CONTROLLED WITH YOUR NAUSEA MEDICATION *UNUSUAL SHORTNESS OF BREATH *UNUSUAL BRUISING OR BLEEDING *URINARY PROBLEMS (pain or burning when urinating, or frequent urination) *BOWEL PROBLEMS (unusual diarrhea, constipation, pain near the anus) TENDERNESS IN MOUTH AND THROAT WITH OR WITHOUT PRESENCE OF ULCERS (sore throat, sores in mouth, or a toothache) UNUSUAL RASH, SWELLING OR PAIN  UNUSUAL VAGINAL DISCHARGE OR ITCHING   Items with * indicate a potential emergency and should be followed up as soon as possible or go to the Emergency Department if any problems should occur.  Please show the CHEMOTHERAPY ALERT CARD or IMMUNOTHERAPY ALERT CARD at check-in to the  Emergency Department and triage nurse.  Should you have questions after your visit or need to cancel or reschedule your appointment, please contact [facility]  Dept: [phone]  and follow the prompts.  Office hours are 8:00 a.m. to 4:30 p.m. Monday - Friday. Please note that voicemails left after 4:00 p.m. may not be returned until the following business day.  We are closed weekends and major holidays. You have access to a nurse at all times for urgent questions. Please call the main number to the clinic Dept: [phone] and follow the prompts.   For any non-urgent questions, you may also contact your provider using MyChart. We now offer e-Visits for anyone 70 and older to request care online for non-urgent symptoms. For details visit [email or web address].   Also download the MyChart app! Go to the app store, search "MyChart", open the app, select [facility], and log in with your MyChart username and password.  Due to Covid, a mask is required upon entering the hospital/clinic. If you do not have a mask, one will be given to you upon arrival. For doctor visits, patients may have 1 support person aged 25 or older with them. For treatment visits, patients cannot have anyone with them due to current Covid guidelines and our immunocompromised population.

## 2021-04-04 ENCOUNTER — Encounter: Payer: Self-pay | Admitting: Hematology and Oncology

## 2021-04-04 ENCOUNTER — Encounter: Payer: Self-pay | Admitting: Oncology

## 2021-04-05 NOTE — Progress Notes (Signed)
[place]  [phone] [phone]     ID: Leslie Wright DOB: [birth year]  MR#: [id number]  TDD#:[id number]  Patient Care Team: Fanny Bien, MD as PCP - General (Family Medicine) Herminio Commons, MD (Inactive) as PCP - Cardiology (Cardiology) Mauro Kaufmann, RN as Oncology Nurse Navigator Rockwell Germany, RN as Oncology Nurse Navigator Stark Klein, MD as Consulting Physician (General Surgery) Kyung Rudd, MD as Consulting Physician (Radiation Oncology) Scot Dock, NP OTHER MD:  CHIEF COMPLAINT: Bilateral breast cancers  CURRENT TREATMENT: Neoadjuvant chemotherapy   INTERVAL HISTORY: Leslie Wright returns today for follow up and treatment of her bilateral breast cancers.   She began neoadjuvant chemotherapy, consisting of pembrolizumab, carboplatin, and paclitaxel, on 56/13/2022. Today is day 56 cycle 3.  She is receiving Paclitaxel alone today.  She is tolerating treatment well.  She does endorse fatigue and deconditioning.  She in particular denies peripheral neuropathy.  Otherwise she is doing quite well.    We are following her TSH noted below.    Results for Leslie, Wright" (MRN [id number]) as of 04/07/2021 13:48  Ref. Range 09/27/2016 13:42 02/17/2021 08:15 02/23/2021 10:57 03/02/2021 09:01 03/23/2021 08:37  TSH Latest Ref Range: 0.308 - 3.960 uIU/mL 1.890 2.733 1.316 1.527 1.823     REVIEW OF SYSTEMS: Review of Systems  Constitutional:  Positive for fatigue. Negative for appetite change, chills, fever and unexpected weight change.  HENT:   Negative for hearing loss, lump/mass and trouble swallowing.   Eyes:  Negative for eye problems and icterus.  Respiratory:  Negative for chest tightness, cough and shortness of breath.   Cardiovascular:  Negative for chest pain, leg swelling and palpitations.  Gastrointestinal:  Negative for abdominal distention, abdominal pain, constipation, diarrhea, nausea and vomiting.  Endocrine:  Negative for hot flashes.  Genitourinary:  Negative for difficulty urinating.   Musculoskeletal:  Negative for arthralgias.  Skin:  Negative for itching and rash.  Neurological:  Negative for dizziness, extremity weakness, headaches and numbness.  Hematological:  Negative for adenopathy. Does not bruise/bleed easily.  Psychiatric/Behavioral:  Negative for depression. The patient is not nervous/anxious.       COVID 19 VACCINATION STATUS: Status post [place] x2, no booster as of 56/26/2022.   HISTORY OF CURRENT ILLNESS: From the original intake note:  Leslie Wright" presented with a palpable left breast lump. She underwent bilateral diagnostic mammography with tomography and bilateral breast ultrasonography at [facility] on 01/12/2021 showing: breast density category C; 5.8 cm mass in the left breast at 12:30 with associated calcifications; additional 1.2 cm group of calcifications in the outer left breast;   On the right: Multiple small masses and irregular ducts in the retroareolar right breast, including a 0.8 cm representative mass at 6 o'clock; indeterminate 0.4 cm mass in upper-inner right breast; normal lymph nodes bilaterally.  Accordingly on 01/16/2021 she proceeded to biopsy of the right breast areas in question. The pathology from this procedure ([id number]) showed:  1. Right Breast, 6 o'clock  - invasive ductal carcinoma, grade 2. Prognostic indicators significant for: estrogen receptor, 90% positive with strong staining intensity and progesterone receptor, 0% negative. Proliferation marker Ki67 at 5%. HER2 equivocal by immunohistochemistry (2+), but negative by fluorescent in situ hybridization with a signals ratio 1.61 and number per cell 2.5. 2. Right Breast, 9 o'clock  - fibrocystic change with adenosis, calcifications, and a small intraductal papilloma.  She underwent biopsy of the left breast areas on 01/20/2021. Pathology [id number])  revealed: 1. Left Breast,  upper-outer quadrant  - invasive ductal carcinoma, grade 3 2. Left Breast, central, slightly lateral to midline  - ductal carcinoma in situ with necrosis and calcifications, intermediate grade Prognostic panels are pending on both samples.  Cancer Staging Malignant neoplasm of lower-outer quadrant of right breast of female, estrogen receptor positive ([place]) Staging form: Breast, AJCC 8th Edition - Clinical stage from 01/20/2021: Stage IA (cT1c, cN0, cM0, G2, ER+, PR-, HER2-) - Signed by Chauncey Cruel, MD on 01/25/2021 Stage prefix: Initial diagnosis Histologic grading system: 3 grade system  Malignant neoplasm of overlapping sites of left breast in female, estrogen receptor negative ([place]) Staging form: Breast, AJCC 8th Edition - Clinical stage from 01/20/2021: Stage IIIB (cT3, cN0, cM0, G3, ER-, PR-, HER2-) - Signed by Chauncey Cruel, MD on 01/25/2021 Histologic grading system: 3 grade system  The patient's subsequent history is as detailed below.   PAST MEDICAL HISTORY: Past Medical History:  Diagnosis Date   Acid reflux    Anemia    Arthritis    "maybe in my fingers" (03/26/2018)   Breast cancer ([facility]) 12/2020   both sides   Concussion 2001   no deficit had tia right after no problems since   Family history of adverse reaction to anesthesia    "brother, Leslie Wright, and my mom always get sick" (03/26/2018)   Family history of brain cancer    Family history of breast cancer    Family history of colon cancer    Family history of lung cancer    Family history of pancreatic cancer    Family history of prostate cancer    Headache    High blood pressure    High cholesterol    Hip pain    "left; before OR" (03/26/2018)   History of chemotherapy last done 02-17-2021   History of hiatal hernia    History of kidney stones    OSA (obstructive sleep apnea)    "can't tolerate the mask" (03/26/2018)    PONV (postoperative nausea and vomiting)    does not need much anesthesia, slow to  awlaekn in past   Pre-diabetes    Shortness of breath    with exertion   Wears partial dentures upper    PAST SURGICAL HISTORY: Past Surgical History:  Procedure Laterality Date   CARPAL TUNNEL RELEASE Right ~ 2006   JOINT REPLACEMENT     LAPAROSCOPIC CHOLECYSTECTOMY  12/2010   LASIK Bilateral 2000   PORT A CATH REVISION Left 02/21/2021   Procedure: PORT A CATH REVISION;  Surgeon: Stark Klein, MD;  Location: [facility];  Service: General;  Laterality: Left;  60 MINUTES ROOM 5   PORTACATH PLACEMENT N/A 02/08/2021   Procedure: INSERTION PORT-A-CATH;  Surgeon: Stark Klein, MD;  Location: [facility];  Service: General;  Laterality: N/A;   TOTAL HIP ARTHROPLASTY Left 03/25/2018   Procedure: LEFT TOTAL HIP ARTHROPLASTY ANTERIOR APPROACH;  Surgeon: Mcarthur Rossetti, MD;  Location: [facility];  Service: Orthopedics;  Laterality: Left;    FAMILY HISTORY: Family History  Problem Relation Age of Onset   Diabetes Mother    Thyroid disease Mother    Liver disease Mother    Obesity Mother    Hypertension Father    Hyperlipidemia Father    Heart disease Father    Stroke Father    Obesity Father    Colon polyps Father    Prostate cancer Brother    Brain cancer Maternal Grandfather  Pancreatic cancer Paternal Grandmother    Colon cancer Neg Hx        No one in family with colon cancer.  The patient's father is 101 years old as of Feb 01, 2021.  The patient's mother died from cirrhosis associated with hepatitis C (from a transfusion) age 16.  The patient has 3 brothers, no sisters.  1 brother has a history of prostate cancer at age 60.  A maternal grandfather had a history of central nervous system carcinoma.  A paternal grandmother had a history of pancreatic cancer and a maternal aunt had a history of breast cancer in her late 48s.   GYNECOLOGIC HISTORY:  No LMP recorded (lmp unknown). Patient is postmenopausal. Menarche: 55 years old [place] P 0 LMP 53 HRT no  Hysterectomy?  no BSO? no   SOCIAL HISTORY: (updated 02-01-2021)  Rosaria Ferries "Leslie Wright" works for [employer] in [employer].  She works from home.  She is a Engineer, mining.  She describes herself as single, lives by herself, with a toy poodle and a [place] as well as 2 cats.  ADVANCED DIRECTIVES: The patient's brother Daija Routson is her healthcare power of attorney.  He can be reached at [phone]   HEALTH MAINTENANCE: Social History   Tobacco Use   Smoking status: Former    Pack years: 0.00    Types: Cigarettes   Smokeless tobacco: Never   Tobacco comments:    In teens social  Vaping Use   Vaping Use: Never used  Substance Use Topics   Alcohol use: Yes    Comment: Occasional   Drug use: Never     Colonoscopy:   PAP: 03/2011  Bone density: scheduled for 07/2021   No Known Allergies  Current Outpatient Medications  Medication Sig Dispense Refill   acetaminophen (TYLENOL) 500 MG tablet Take 1,000 mg by mouth every 6 (six) hours as needed for moderate pain.     amoxicillin-clavulanate (AUGMENTIN) 875-125 MG tablet Take 1 tablet by mouth 2 (two) times daily. 14 tablet 0   aspirin EC 81 MG tablet Take 81 mg by mouth daily.     betamethasone valerate ointment (VALISONE) 0.1 % Apply 1 application topically 2 (two) times daily. 30 g 0   Blood Glucose Monitoring Suppl (FREESTYLE LITE) w/Device KIT CHECK GLUCOSE 2-3 TIMES DAILY 1 kit 0   Cholecalciferol (VITAMIN D) 50 MCG (2000 UT) CAPS Take 2,000 Units by mouth daily.     clindamycin (CLINDAGEL) 1 % gel Apply topically 2 (two) times daily. 30 g 0   dexamethasone (DECADRON) 4 MG tablet Take 2 tablets once a day for 3 days after carboplatin and AC chemotherapy. Take with food. 30 tablet 1   ELDERBERRY PO Take 1 capsule by mouth daily.     folic acid (FOLVITE) 1 MG tablet Take 1 mg by mouth daily.     glucose blood test strip USE AS DIRECTED TO CHECK BLOOD SUGAR 1 TO 3 TIMES A DAY (Patient taking differently: USE AS DIRECTED TO CHECK BLOOD SUGAR 1 TO  3 TIMES A DAY) 100 strip 3   ketoconazole (NIZORAL) 2 % cream Apply 1 application topically daily. (Patient taking differently: Apply 1 application topically daily as needed.) 15 g 0   Lancets (FREESTYLE) lancets USE AS DIRECTED TO CHECK BLOOD SUGAR 2 TO 3 TIMES A DAY (Patient taking differently: USE AS DIRECTED TO CHECK BLOOD SUGAR 2 TO 3 TIMES A DAY) 100 each 3   lidocaine-prilocaine (EMLA) cream Apply to affected area once  30 g 3   losartan (COZAAR) 100 MG tablet Take 1 tablet by mouth daily 90 tablet 3   methotrexate (RHEUMATREX) 15 MG tablet Take 15 mg by mouth every Wednesday. Caution: Chemotherapy. Protect from light.     Multiple Vitamin (MULTIVITAMIN WITH MINERALS) TABS tablet Take 1 tablet by mouth daily.     omeprazole (PRILOSEC) 20 MG capsule Take 20 mg by mouth daily.     ondansetron (ZOFRAN) 8 MG tablet Take 1 tablet (8 mg total) by mouth 2 (two) times daily as needed. Start on the third day after carboplatin and AC chemotherapy. 30 tablet 1   prochlorperazine (COMPAZINE) 10 MG tablet Take 1 tablet (10 mg total) by mouth every 6 (six) hours as needed (Nausea or vomiting). 30 tablet 1   rosuvastatin (CRESTOR) 10 MG tablet TAKE 1 TABLET BY MOUTH ONCE A DAY AT BEDTIME FOR CHOLESTEROL (Patient taking differently: Take 10 mg by mouth at bedtime.) 90 tablet 1   No current facility-administered medications for this visit.    OBJECTIVE:   Vitals:   04/06/21 1356  BP: (!) 104/58  Pulse: 85  Resp: 18  Temp: 97.7 F (36.5 C)  SpO2: 97%      Body mass index is 47.44 kg/m.   Wt Readings from Last 3 Encounters:  04/06/21 (!) 302 lb 14.4 oz (137.4 kg)  03/23/21 (!) 305 lb 6.4 oz (138.5 kg)  03/16/21 (!) 303 lb 12.8 oz (137.8 kg)      ECOG FS:1 - Symptomatic but completely ambulatory GENERAL: Patient is a well appearing female in no acute distress HEENT:  Sclerae anicteric.  Mask in place. Neck is supple.  NODES:  No cervical, supraclavicular, or axillary lymphadenopathy palpated.   BREAST EXAM:  Deferred. (Dr. Jana Hakim examined last week and no change per patient) LUNGS:  Clear to auscultation bilaterally.  No wheezes or rhonchi. HEART:  Regular rate and rhythm. No murmur appreciated. ABDOMEN:  Soft, nontender.  Positive, normoactive bowel sounds. No organomegaly palpated. MSK:  No focal spinal tenderness to palpation. Full range of motion bilaterally in the upper extremities. EXTREMITIES:  No peripheral edema.   SKIN:  Clear with no obvious rashes or skin changes. No nail dyscrasia. NEURO:  Nonfocal. Well oriented.  Appropriate affect.    LAB RESULTS:  CMP     Component Value Date/Time   NA 141 04/06/2021 1250   NA 143 12/02/2018 1138   K 3.7 04/06/2021 1250   CL 104 04/06/2021 1250   CO2 25 04/06/2021 1250   GLUCOSE 106 (H) 04/06/2021 1250   BUN 26 (H) 04/06/2021 1250   BUN 17 12/02/2018 1138   CREATININE 1.34 (H) 04/06/2021 1250   CREATININE 1.31 (H) 03/16/2021 0850   CREATININE 1.09 (H) 02/18/2019 1524   CALCIUM 9.0 04/06/2021 1250   PROT 6.4 (L) 04/06/2021 1250   PROT 6.9 12/02/2018 1138   ALBUMIN 3.4 (L) 04/06/2021 1250   ALBUMIN 4.0 12/02/2018 1138   AST 14 (L) 04/06/2021 1250   AST 19 03/16/2021 0850   ALT 26 04/06/2021 1250   ALT 28 03/16/2021 0850   ALKPHOS 65 04/06/2021 1250   BILITOT 0.3 04/06/2021 1250   BILITOT <0.1 (L) 03/16/2021 0850   GFRNONAA 47 (L) 04/06/2021 1250   GFRNONAA 48 (L) 03/16/2021 0850   GFRAA 70 12/02/2018 1138    No results found for: TOTALPROTELP, ALBUMINELP, A1GS, A2GS, BETS, BETA2SER, GAMS, MSPIKE, SPEI  Lab Results  Component Value Date   WBC 6.7 04/06/2021   NEUTROABS 3.0  04/06/2021   HGB 11.3 (L) 04/06/2021   HCT 34.1 (L) 04/06/2021   MCV 91.4 04/06/2021   PLT 143 (L) 04/06/2021    No results found for: LABCA2  No components found for: HCWCBJ628  No results for input(s): INR in the last 168 hours.  No results found for: LABCA2  No results found for: BTD176  No results found for:  HYW737  No results found for: TGG269  No results found for: CA2729  No components found for: HGQUANT  No results found for: CEA1 / No results found for: CEA1   No results found for: AFPTUMOR  No results found for: CHROMOGRNA  No results found for: KPAFRELGTCHN, LAMBDASER, KAPLAMBRATIO (kappa/lambda light chains)  No results found for: HGBA, HGBA2QUANT, HGBFQUANT, HGBSQUAN (Hemoglobinopathy evaluation)   No results found for: LDH  No results found for: IRON, TIBC, IRONPCTSAT (Iron and TIBC)  No results found for: FERRITIN  Urinalysis    Component Value Date/Time   COLORURINE YELLOW 10/23/2010 2031   APPEARANCEUR CLEAR 10/23/2010 2031   LABSPEC 1.025 10/23/2010 2031   PHURINE 6.0 10/23/2010 2031   GLUCOSEU NEGATIVE 03/12/2009 0843   HGBUR MODERATE (A) 10/23/2010 2031   BILIRUBINUR NEGATIVE 10/23/2010 2031   KETONESUR NEGATIVE 10/23/2010 2031   PROTEINUR TRACE (A) 10/23/2010 2031   UROBILINOGEN 0.2 10/23/2010 2031   NITRITE NEGATIVE 10/23/2010 2031   LEUKOCYTESUR NEGATIVE 10/23/2010 2031    STUDIES: No results found.    ELIGIBLE FOR AVAILABLE RESEARCH PROTOCOL:   ASSESSMENT: 55 y.o. 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  (a) secondary biopsy shows ductal carcinoma in situ associated with extensive patchy enhancement  (3) staging studies:  (a) chest CT scan  02/06/2021 shows hepatic steatosis and degenerative disc disease, no evidence of metastatic disease  (b) bone scan 02/06/2021 shows no evidence of metastatic disease  (4) neoadjuvant chemotherapy will start 02/09/2021 consisting of pembrolizumab/carboplatin/paclitaxel followed by pembrolizumab/doxorubicin/cyclophosphamide  (5) definitive surgery to follow  (6) adjuvant radiation to bilateral sites  (7) Negative genetic testing on 02/13/2021. VUS in ATM called c.1132A>G identified.  Genes tested include: AIP, ALK, APC, ATM, AXIN2,BAP1,  BARD1, BLM, BMPR1A, BRCA1, BRCA2, BRIP1, CASR, CDC73, CDH1, CDK4, CDKN1B, CDKN1C, CDKN2A (p14ARF), CDKN2A (p16INK4a), CEBPA, CHEK2, CTNNA1, DICER1, DIS3L2, EGFR (c.2369C>T, p.Thr790Met variant only), EPCAM (Deletion/duplication testing only), FH, FLCN, GATA2, GPC3, GREM1 (Promoter region deletion/duplication testing only), HOXB13 (c.251G>A, p.Gly84Glu), HRAS, KIT, MAX, MEN1, MET, MITF (c.952G>A, p.Glu318Lys variant only), MLH1, MSH2, MSH3, MSH6, MUTYH, NBN, NF1, NF2, NTHL1, PALB2, PDGFRA, PHOX2B, PMS2, POLD1, POLE, POT1, PRKAR1A, PTCH1, PTEN, RAD50, RAD51C, RAD51D, RB1, RECQL4, RET, RUNX1, SDHAF2, SDHA (sequence changes only), SDHB, SDHC, SDHD, SMAD4, SMARCA4, SMARCB1, SMARCE1, STK11, SUFU, TERC, TERT, TMEM127, TP53, TSC1, TSC2, VHL, WRN and WT1.  PLAN: Leslie Wright is doing well today.  Her labs are stable.  She will proceed with Taxol alone today.  She is tolerating her neoadjuvant chmeotherapy well and is not having any concerning issues in particular  peripheral neuropathy.    Leslie Wright and I discussed her fatigue and deconditioning.  This is a common side effect from her chemotherapy treatment.  I recommended that she walk for 1-2 minutes per day and add a minute each week to increase her stamina.  She is planning on doing this.    Leslie Wright will return weekly for her treatments and we are seeing her with every other one.  She knows to call for any questions that may arise between now  and her next appointment.  We are happy to see her sooner if needed.   Total encounter time 20 minutes.Wilber Bihari, NP 04/07/21 1:50 PM Medical Oncology and Hematology [facility] [place], [place] [address] Tel. [phone]    Fax. [phone]    *Total Encounter Time as defined by the Centers for Medicare and Medicaid Services includes, in addition to the face-to-face time of a patient visit (documented in the note above) non-face-to-face time: obtaining and reviewing outside history, ordering and reviewing medications, tests or procedures, care coordination (communications with other health care professionals or caregivers) and documentation in the medical record.

## 2021-04-06 ENCOUNTER — Encounter: Payer: Self-pay | Admitting: Adult Health

## 2021-04-06 ENCOUNTER — Inpatient Hospital Stay (HOSPITAL_BASED_OUTPATIENT_CLINIC_OR_DEPARTMENT_OTHER): Payer: 59 | Admitting: Adult Health

## 2021-04-06 ENCOUNTER — Inpatient Hospital Stay: Payer: 59

## 2021-04-06 ENCOUNTER — Inpatient Hospital Stay: Payer: 59 | Attending: Adult Health

## 2021-04-06 ENCOUNTER — Other Ambulatory Visit: Payer: Self-pay | Admitting: *Deleted

## 2021-04-06 ENCOUNTER — Other Ambulatory Visit: Payer: 59

## 2021-04-06 ENCOUNTER — Other Ambulatory Visit: Payer: Self-pay

## 2021-04-06 VITALS — BP 104/58 | HR 85 | Temp 97.7°F | Resp 18 | Ht 67.0 in | Wt 302.9 lb

## 2021-04-06 DIAGNOSIS — C50211 Malignant neoplasm of upper-inner quadrant of right female breast: Secondary | ICD-10-CM | POA: Insufficient documentation

## 2021-04-06 DIAGNOSIS — C50812 Malignant neoplasm of overlapping sites of left female breast: Secondary | ICD-10-CM

## 2021-04-06 DIAGNOSIS — R5383 Other fatigue: Secondary | ICD-10-CM | POA: Insufficient documentation

## 2021-04-06 DIAGNOSIS — C50811 Malignant neoplasm of overlapping sites of right female breast: Secondary | ICD-10-CM | POA: Insufficient documentation

## 2021-04-06 DIAGNOSIS — Z95828 Presence of other vascular implants and grafts: Secondary | ICD-10-CM

## 2021-04-06 DIAGNOSIS — Z5111 Encounter for antineoplastic chemotherapy: Secondary | ICD-10-CM | POA: Diagnosis not present

## 2021-04-06 DIAGNOSIS — Z5112 Encounter for antineoplastic immunotherapy: Secondary | ICD-10-CM | POA: Diagnosis not present

## 2021-04-06 DIAGNOSIS — Z171 Estrogen receptor negative status [ER-]: Secondary | ICD-10-CM | POA: Insufficient documentation

## 2021-04-06 DIAGNOSIS — D701 Agranulocytosis secondary to cancer chemotherapy: Secondary | ICD-10-CM | POA: Insufficient documentation

## 2021-04-06 DIAGNOSIS — R6889 Other general symptoms and signs: Secondary | ICD-10-CM | POA: Insufficient documentation

## 2021-04-06 DIAGNOSIS — Z170 Estrogen receptor positive status [ER+]: Secondary | ICD-10-CM

## 2021-04-06 DIAGNOSIS — Z5189 Encounter for other specified aftercare: Secondary | ICD-10-CM | POA: Diagnosis not present

## 2021-04-06 DIAGNOSIS — C50412 Malignant neoplasm of upper-outer quadrant of left female breast: Secondary | ICD-10-CM | POA: Insufficient documentation

## 2021-04-06 DIAGNOSIS — C50511 Malignant neoplasm of lower-outer quadrant of right female breast: Secondary | ICD-10-CM

## 2021-04-06 DIAGNOSIS — Z79899 Other long term (current) drug therapy: Secondary | ICD-10-CM | POA: Insufficient documentation

## 2021-04-06 LAB — COMPREHENSIVE METABOLIC PANEL
ALT: 26 U/L (ref 0–44)
AST: 14 U/L — ABNORMAL LOW (ref 15–41)
Albumin: 3.4 g/dL — ABNORMAL LOW (ref 3.5–5.0)
Alkaline Phosphatase: 65 U/L (ref 38–126)
Anion gap: 12 (ref 5–15)
BUN: 26 mg/dL — ABNORMAL HIGH (ref 6–20)
CO2: 25 mmol/L (ref 22–32)
Calcium: 9 mg/dL (ref 8.9–10.3)
Chloride: 104 mmol/L (ref 98–111)
Creatinine, Ser: 1.34 mg/dL — ABNORMAL HIGH (ref 0.44–1.00)
GFR, Estimated: 47 mL/min — ABNORMAL LOW (ref >60)
Glucose, Bld: 106 mg/dL — ABNORMAL HIGH (ref 70–99)
Potassium: 3.7 mmol/L (ref 3.5–5.1)
Sodium: 141 mmol/L (ref 135–145)
Total Bilirubin: 0.3 mg/dL (ref 0.3–1.2)
Total Protein: 6.4 g/dL — ABNORMAL LOW (ref 6.5–8.1)

## 2021-04-06 LAB — CBC WITH DIFFERENTIAL/PLATELET
Abs Immature Granulocytes: 0.29 10*3/uL — ABNORMAL HIGH (ref 0.00–0.07)
Basophils Absolute: 0 10*3/uL (ref 0.0–0.1)
Basophils Relative: 1 %
Eosinophils Absolute: 0 10*3/uL (ref 0.0–0.5)
Eosinophils Relative: 0 %
HCT: 34.1 % — ABNORMAL LOW (ref 36.0–46.0)
Hemoglobin: 11.3 g/dL — ABNORMAL LOW (ref 12.0–15.0)
Immature Granulocytes: 4 %
Lymphocytes Relative: 42 %
Lymphs Abs: 2.8 10*3/uL (ref 0.7–4.0)
MCH: 30.3 pg (ref 26.0–34.0)
MCHC: 33.1 g/dL (ref 30.0–36.0)
MCV: 91.4 fL (ref 80.0–100.0)
Monocytes Absolute: 0.5 10*3/uL (ref 0.1–1.0)
Monocytes Relative: 8 %
Neutro Abs: 3 10*3/uL (ref 1.7–7.7)
Neutrophils Relative %: 45 %
Platelets: 143 10*3/uL — ABNORMAL LOW (ref 150–400)
RBC: 3.73 MIL/uL — ABNORMAL LOW (ref 3.87–5.11)
RDW: 20.3 % — ABNORMAL HIGH (ref 11.5–15.5)
WBC: 6.7 10*3/uL (ref 4.0–10.5)
nRBC: 2 % — ABNORMAL HIGH (ref 0.0–0.2)

## 2021-04-06 MED ORDER — SODIUM CHLORIDE 0.9% FLUSH
10.0000 mL | INTRAVENOUS | Status: DC | PRN
  Administered 2021-04-06: 10 mL
  Filled 2021-04-06: qty 10

## 2021-04-06 MED ORDER — SODIUM CHLORIDE 0.9 % IV SOLN
Freq: Once | INTRAVENOUS | Status: AC
  Filled 2021-04-06: qty 250

## 2021-04-06 MED ORDER — SODIUM CHLORIDE 0.9 % IV SOLN
10.0000 mg | Freq: Once | INTRAVENOUS | Status: AC
  Administered 2021-04-06: 10 mg via INTRAVENOUS
  Filled 2021-04-06: qty 10

## 2021-04-06 MED ORDER — ALTEPLASE 2 MG IJ SOLR
2.0000 mg | Freq: Once | INTRAMUSCULAR | Status: AC
Start: 2021-04-06 — End: 2021-04-06
  Administered 2021-04-06: 2 mg
  Filled 2021-04-06: qty 2

## 2021-04-06 MED ORDER — HEPARIN SOD (PORK) LOCK FLUSH 100 UNIT/ML IV SOLN
500.0000 [IU] | Freq: Once | INTRAVENOUS | Status: AC | PRN
  Administered 2021-04-06: 500 [IU]
  Filled 2021-04-06: qty 5

## 2021-04-06 MED ORDER — FAMOTIDINE 20 MG IN NS 100 ML IVPB
INTRAVENOUS | Status: AC
  Filled 2021-04-06: qty 100

## 2021-04-06 MED ORDER — ALTEPLASE 2 MG IJ SOLR
INTRAMUSCULAR | Status: AC
  Filled 2021-04-06: qty 2

## 2021-04-06 MED ORDER — FAMOTIDINE 20 MG IN NS 100 ML IVPB
20.0000 mg | Freq: Once | INTRAVENOUS | Status: AC
  Administered 2021-04-06: 20 mg via INTRAVENOUS

## 2021-04-06 MED ORDER — SODIUM CHLORIDE 0.9 % IV SOLN
80.0000 mg/m2 | Freq: Once | INTRAVENOUS | Status: AC
  Administered 2021-04-06: 204 mg via INTRAVENOUS
  Filled 2021-04-06: qty 34

## 2021-04-06 MED ORDER — DIPHENHYDRAMINE HCL 50 MG/ML IJ SOLN
25.0000 mg | Freq: Once | INTRAMUSCULAR | Status: AC
  Administered 2021-04-06: 25 mg via INTRAVENOUS

## 2021-04-06 MED ORDER — DIPHENHYDRAMINE HCL 50 MG/ML IJ SOLN
INTRAMUSCULAR | Status: AC
  Filled 2021-04-06: qty 1

## 2021-04-06 MED ORDER — SODIUM CHLORIDE 0.9% FLUSH
10.0000 mL | Freq: Once | INTRAVENOUS | Status: AC
  Administered 2021-04-06: 10 mL
  Filled 2021-04-06: qty 10

## 2021-04-06 NOTE — Patient Instructions (Signed)
[facility]  Discharge Instructions: Thank you for choosing [facility] to provide your oncology and hematology care.   If you have a lab appointment with the [facility], please go directly to the [facility] and check in at the registration area.   Wear comfortable clothing and clothing appropriate for easy access to any Portacath or PICC line.   We strive to give you quality time with your provider. You may need to reschedule your appointment if you arrive late (15 or more minutes).  Arriving late affects you and other patients whose appointments are after yours.  Also, if you miss three or more appointments without notifying the office, you may be dismissed from the clinic at the provider's discretion.      For prescription refill requests, have your pharmacy contact our office and allow 72 hours for refills to be completed.    Today you received the following chemotherapy and/or immunotherapy agents: Taxol      To help prevent nausea and vomiting after your treatment, we encourage you to take your nausea medication as directed.  BELOW ARE SYMPTOMS THAT SHOULD BE REPORTED IMMEDIATELY: *FEVER GREATER THAN 100.4 F (38 C) OR HIGHER *CHILLS OR SWEATING *NAUSEA AND VOMITING THAT IS NOT CONTROLLED WITH YOUR NAUSEA MEDICATION *UNUSUAL SHORTNESS OF BREATH *UNUSUAL BRUISING OR BLEEDING *URINARY PROBLEMS (pain or burning when urinating, or frequent urination) *BOWEL PROBLEMS (unusual diarrhea, constipation, pain near the anus) TENDERNESS IN MOUTH AND THROAT WITH OR WITHOUT PRESENCE OF ULCERS (sore throat, sores in mouth, or a toothache) UNUSUAL RASH, SWELLING OR PAIN  UNUSUAL VAGINAL DISCHARGE OR ITCHING   Items with * indicate a potential emergency and should be followed up as soon as possible or go to the Emergency Department if any problems should occur.  Please show the CHEMOTHERAPY ALERT CARD or IMMUNOTHERAPY ALERT CARD at check-in to the  Emergency Department and triage nurse.  Should you have questions after your visit or need to cancel or reschedule your appointment, please contact [facility]  Dept: [phone]  and follow the prompts.  Office hours are 8:00 a.m. to 4:30 p.m. Monday - Friday. Please note that voicemails left after 4:00 p.m. may not be returned until the following business day.  We are closed weekends and major holidays. You have access to a nurse at all times for urgent questions. Please call the main number to the clinic Dept: [phone] and follow the prompts.   For any non-urgent questions, you may also contact your provider using MyChart. We now offer e-Visits for anyone 13 and older to request care online for non-urgent symptoms. For details visit [email or web address].   Also download the MyChart app! Go to the app store, search "MyChart", open the app, select [facility], and log in with your MyChart username and password.  Due to Covid, a mask is required upon entering the hospital/clinic. If you do not have a mask, one will be given to you upon arrival. For doctor visits, patients may have 1 support person aged 18 or older with them. For treatment visits, patients cannot have anyone with them due to current Covid guidelines and our immunocompromised population.

## 2021-04-07 ENCOUNTER — Encounter: Payer: Self-pay | Admitting: Oncology

## 2021-04-07 ENCOUNTER — Inpatient Hospital Stay: Payer: 59

## 2021-04-07 ENCOUNTER — Encounter: Payer: Self-pay | Admitting: Hematology and Oncology

## 2021-04-08 ENCOUNTER — Other Ambulatory Visit: Payer: Self-pay

## 2021-04-08 ENCOUNTER — Inpatient Hospital Stay: Payer: 59

## 2021-04-08 VITALS — BP 141/73 | HR 71 | Temp 98.1°F | Resp 18 | Ht 67.0 in

## 2021-04-08 DIAGNOSIS — C50211 Malignant neoplasm of upper-inner quadrant of right female breast: Secondary | ICD-10-CM | POA: Diagnosis not present

## 2021-04-08 DIAGNOSIS — Z5111 Encounter for antineoplastic chemotherapy: Secondary | ICD-10-CM | POA: Diagnosis not present

## 2021-04-08 DIAGNOSIS — Z5112 Encounter for antineoplastic immunotherapy: Secondary | ICD-10-CM | POA: Diagnosis not present

## 2021-04-08 DIAGNOSIS — Z5189 Encounter for other specified aftercare: Secondary | ICD-10-CM | POA: Diagnosis not present

## 2021-04-08 DIAGNOSIS — Z170 Estrogen receptor positive status [ER+]: Secondary | ICD-10-CM | POA: Diagnosis not present

## 2021-04-08 DIAGNOSIS — C50412 Malignant neoplasm of upper-outer quadrant of left female breast: Secondary | ICD-10-CM | POA: Diagnosis not present

## 2021-04-08 DIAGNOSIS — Z79899 Other long term (current) drug therapy: Secondary | ICD-10-CM | POA: Diagnosis not present

## 2021-04-08 DIAGNOSIS — Z171 Estrogen receptor negative status [ER-]: Secondary | ICD-10-CM

## 2021-04-08 DIAGNOSIS — C50511 Malignant neoplasm of lower-outer quadrant of right female breast: Secondary | ICD-10-CM

## 2021-04-08 DIAGNOSIS — C50812 Malignant neoplasm of overlapping sites of left female breast: Secondary | ICD-10-CM

## 2021-04-08 DIAGNOSIS — D701 Agranulocytosis secondary to cancer chemotherapy: Secondary | ICD-10-CM | POA: Diagnosis not present

## 2021-04-08 DIAGNOSIS — R6889 Other general symptoms and signs: Secondary | ICD-10-CM | POA: Diagnosis not present

## 2021-04-08 MED ORDER — PEGFILGRASTIM-CBQV 6 MG/0.6ML ~~LOC~~ SOSY
PREFILLED_SYRINGE | SUBCUTANEOUS | Status: AC
  Filled 2021-04-08: qty 0.6

## 2021-04-08 MED ORDER — FILGRASTIM-SNDZ 300 MCG/0.5ML IJ SOSY
PREFILLED_SYRINGE | INTRAMUSCULAR | Status: AC
  Filled 2021-04-08: qty 1

## 2021-04-08 MED ORDER — FILGRASTIM-SNDZ 300 MCG/0.5ML IJ SOSY
300.0000 ug | PREFILLED_SYRINGE | Freq: Once | INTRAMUSCULAR | Status: AC
  Administered 2021-04-08: 300 ug via SUBCUTANEOUS

## 2021-04-08 NOTE — Patient Instructions (Signed)
Filgrastim, G-CSF injection What is this medication? FILGRASTIM, G-CSF (fil GRA stim) is a granulocyte colony-stimulating factor that stimulates the growth of neutrophils, a type of white blood cell (WBC) important in the body's fight against infection. It is used to reduce the incidence of fever and infection in patients with certain types of cancer who are receiving chemotherapy that affects the bone marrow, to stimulate blood cell production for removal of WBCs from the body prior to a bone marrow transplantation, to reduce the incidence of fever and infection in patients who have severe chronic neutropenia, and to improve survival outcomes following high-dose radiation exposure that is toxic to the bone marrow. This medicine may be used for other purposes; ask your health care provider or pharmacist if you have questions. COMMON BRAND NAME(S): Neupogen, Nivestym, Releuko, Zarxio What should I tell my care team before I take this medication? They need to know if you have any of these conditions: kidney disease latex allergy ongoing radiation therapy sickle cell disease an unusual or allergic reaction to filgrastim, pegfilgrastim, other medicines, foods, dyes, or preservatives pregnant or trying to get pregnant breast-feeding How should I use this medication? This medicine is for injection under the skin or infusion into a vein. As an infusion into a vein, it is usually given by a health care professional in a hospital or clinic setting. If you get this medicine at home, you will be taught how to prepare and give this medicine. Refer to the Instructions for Use that come with your medication packaging. Use exactly as directed. Take your medicine at regular intervals. Do not take your medicine more often than directed. It is important that you put your used needles and syringes in a special sharps container. Do not put them in a trash can. If you do not have a sharps container, call your pharmacist  or healthcare provider to get one. Talk to your pediatrician regarding the use of this medicine in children. While this drug may be prescribed for children as young as 7 months for selected conditions, precautions do apply. Overdosage: If you think you have taken too much of this medicine contact a poison control center or emergency room at once. NOTE: This medicine is only for you. Do not share this medicine with others. What if I miss a dose? It is important not to miss your dose. Call your doctor or health care professional if you miss a dose. What may interact with this medication? This medicine may interact with the following medications: medicines that may cause a release of neutrophils, such as lithium This list may not describe all possible interactions. Give your health care provider a list of all the medicines, herbs, non-prescription drugs, or dietary supplements you use. Also tell them if you smoke, drink alcohol, or use illegal drugs. Some items may interact with your medicine. What should I watch for while using this medication? Your condition will be monitored carefully while you are receiving this medicine. You may need blood work done while you are taking this medicine. Talk to your health care provider about your risk of cancer. You may be more at risk for certain types of cancer if you take this medicine. What side effects may I notice from receiving this medication? Side effects that you should report to your doctor or health care professional as soon as possible: allergic reactions like skin rash, itching or hives, swelling of the face, lips, or tongue back pain dizziness or feeling faint fever pain, redness, or   irritation at site where injected pinpoint red spots on the skin shortness of breath or breathing problems signs and symptoms of kidney injury like trouble passing urine, change in the amount of urine, or red or dark-brown urine stomach or side pain, or pain at  the shoulder swelling tiredness unusual bleeding or bruising Side effects that usually do not require medical attention (report to your doctor or health care professional if they continue or are bothersome): bone pain cough diarrhea hair loss headache muscle pain This list may not describe all possible side effects. Call your doctor for medical advice about side effects. You may report side effects to FDA at 1-800-FDA-1088. Where should I keep my medication? Keep out of the reach of children. Store in a refrigerator between 2 and 8 degrees C (36 and 46 degrees F). Do not freeze. Keep in carton to protect from light. Throw away this medicine if vials or syringes are left out of the refrigerator for more than 24 hours. Throw away any unused medicine after the expiration date. NOTE: This sheet is a summary. It may not cover all possible information. If you have questions about this medicine, talk to your doctor, pharmacist, or health care provider.  2022 [employer] (2019-10-08 18:47:55)  

## 2021-04-10 ENCOUNTER — Other Ambulatory Visit: Payer: Self-pay

## 2021-04-10 ENCOUNTER — Inpatient Hospital Stay: Payer: 59

## 2021-04-10 VITALS — BP 115/92 | HR 66 | Temp 98.3°F | Resp 18

## 2021-04-10 DIAGNOSIS — Z171 Estrogen receptor negative status [ER-]: Secondary | ICD-10-CM

## 2021-04-10 DIAGNOSIS — C50211 Malignant neoplasm of upper-inner quadrant of right female breast: Secondary | ICD-10-CM | POA: Diagnosis not present

## 2021-04-10 DIAGNOSIS — Z5111 Encounter for antineoplastic chemotherapy: Secondary | ICD-10-CM | POA: Diagnosis not present

## 2021-04-10 DIAGNOSIS — C50511 Malignant neoplasm of lower-outer quadrant of right female breast: Secondary | ICD-10-CM

## 2021-04-10 DIAGNOSIS — D701 Agranulocytosis secondary to cancer chemotherapy: Secondary | ICD-10-CM | POA: Diagnosis not present

## 2021-04-10 DIAGNOSIS — C50412 Malignant neoplasm of upper-outer quadrant of left female breast: Secondary | ICD-10-CM | POA: Diagnosis not present

## 2021-04-10 DIAGNOSIS — R6889 Other general symptoms and signs: Secondary | ICD-10-CM | POA: Diagnosis not present

## 2021-04-10 DIAGNOSIS — Z5112 Encounter for antineoplastic immunotherapy: Secondary | ICD-10-CM | POA: Diagnosis not present

## 2021-04-10 DIAGNOSIS — C50812 Malignant neoplasm of overlapping sites of left female breast: Secondary | ICD-10-CM

## 2021-04-10 DIAGNOSIS — Z79899 Other long term (current) drug therapy: Secondary | ICD-10-CM | POA: Diagnosis not present

## 2021-04-10 DIAGNOSIS — Z170 Estrogen receptor positive status [ER+]: Secondary | ICD-10-CM | POA: Diagnosis not present

## 2021-04-10 DIAGNOSIS — Z5189 Encounter for other specified aftercare: Secondary | ICD-10-CM | POA: Diagnosis not present

## 2021-04-10 MED ORDER — FILGRASTIM-SNDZ 300 MCG/0.5ML IJ SOSY
PREFILLED_SYRINGE | INTRAMUSCULAR | Status: AC
  Filled 2021-04-10: qty 0.5

## 2021-04-10 MED ORDER — FILGRASTIM-SNDZ 300 MCG/0.5ML IJ SOSY
300.0000 ug | PREFILLED_SYRINGE | Freq: Once | INTRAMUSCULAR | Status: AC
  Administered 2021-04-10: 300 ug via SUBCUTANEOUS

## 2021-04-10 NOTE — Patient Instructions (Signed)
Filgrastim, G-CSF injection What is this medication? FILGRASTIM, G-CSF (fil GRA stim) is a granulocyte colony-stimulating factor that stimulates the growth of neutrophils, a type of white blood cell (WBC) important in the body's fight against infection. It is used to reduce the incidence of fever and infection in patients with certain types of cancer who are receiving chemotherapy that affects the bone marrow, to stimulate blood cell production for removal of WBCs from the body prior to a bone marrow transplantation, to reduce the incidence of fever and infection in patients who have severe chronic neutropenia, and to improve survival outcomes following high-dose radiation exposure that is toxic to the bone marrow. This medicine may be used for other purposes; ask your health care provider or pharmacist if you have questions. COMMON BRAND NAME(S): Neupogen, Nivestym, Releuko, Zarxio What should I tell my care team before I take this medication? They need to know if you have any of these conditions: kidney disease latex allergy ongoing radiation therapy sickle cell disease an unusual or allergic reaction to filgrastim, pegfilgrastim, other medicines, foods, dyes, or preservatives pregnant or trying to get pregnant breast-feeding How should I use this medication? This medicine is for injection under the skin or infusion into a vein. As an infusion into a vein, it is usually given by a health care professional in a hospital or clinic setting. If you get this medicine at home, you will be taught how to prepare and give this medicine. Refer to the Instructions for Use that come with your medication packaging. Use exactly as directed. Take your medicine at regular intervals. Do not take your medicine more often than directed. It is important that you put your used needles and syringes in a special sharps container. Do not put them in a trash can. If you do not have a sharps container, call your pharmacist  or healthcare provider to get one. Talk to your pediatrician regarding the use of this medicine in children. While this drug may be prescribed for children as young as 7 months for selected conditions, precautions do apply. Overdosage: If you think you have taken too much of this medicine contact a poison control center or emergency room at once. NOTE: This medicine is only for you. Do not share this medicine with others. What if I miss a dose? It is important not to miss your dose. Call your doctor or health care professional if you miss a dose. What may interact with this medication? This medicine may interact with the following medications: medicines that may cause a release of neutrophils, such as lithium This list may not describe all possible interactions. Give your health care provider a list of all the medicines, herbs, non-prescription drugs, or dietary supplements you use. Also tell them if you smoke, drink alcohol, or use illegal drugs. Some items may interact with your medicine. What should I watch for while using this medication? Your condition will be monitored carefully while you are receiving this medicine. You may need blood work done while you are taking this medicine. Talk to your health care provider about your risk of cancer. You may be more at risk for certain types of cancer if you take this medicine. What side effects may I notice from receiving this medication? Side effects that you should report to your doctor or health care professional as soon as possible: allergic reactions like skin rash, itching or hives, swelling of the face, lips, or tongue back pain dizziness or feeling faint fever pain, redness, or   irritation at site where injected pinpoint red spots on the skin shortness of breath or breathing problems signs and symptoms of kidney injury like trouble passing urine, change in the amount of urine, or red or dark-brown urine stomach or side pain, or pain at  the shoulder swelling tiredness unusual bleeding or bruising Side effects that usually do not require medical attention (report to your doctor or health care professional if they continue or are bothersome): bone pain cough diarrhea hair loss headache muscle pain This list may not describe all possible side effects. Call your doctor for medical advice about side effects. You may report side effects to FDA at 1-800-FDA-1088. Where should I keep my medication? Keep out of the reach of children. Store in a refrigerator between 2 and 8 degrees C (36 and 46 degrees F). Do not freeze. Keep in carton to protect from light. Throw away this medicine if vials or syringes are left out of the refrigerator for more than 24 hours. Throw away any unused medicine after the expiration date. NOTE: This sheet is a summary. It may not cover all possible information. If you have questions about this medicine, talk to your doctor, pharmacist, or health care provider.  2022 [employer] (2019-10-08 18:47:55)  

## 2021-04-11 ENCOUNTER — Inpatient Hospital Stay: Payer: 59

## 2021-04-11 VITALS — BP 106/74 | HR 78 | Temp 98.7°F | Resp 18

## 2021-04-11 DIAGNOSIS — Z5112 Encounter for antineoplastic immunotherapy: Secondary | ICD-10-CM | POA: Diagnosis not present

## 2021-04-11 DIAGNOSIS — Z5189 Encounter for other specified aftercare: Secondary | ICD-10-CM | POA: Diagnosis not present

## 2021-04-11 DIAGNOSIS — C50511 Malignant neoplasm of lower-outer quadrant of right female breast: Secondary | ICD-10-CM

## 2021-04-11 DIAGNOSIS — Z5111 Encounter for antineoplastic chemotherapy: Secondary | ICD-10-CM | POA: Diagnosis not present

## 2021-04-11 DIAGNOSIS — C50412 Malignant neoplasm of upper-outer quadrant of left female breast: Secondary | ICD-10-CM | POA: Diagnosis not present

## 2021-04-11 DIAGNOSIS — Z170 Estrogen receptor positive status [ER+]: Secondary | ICD-10-CM | POA: Diagnosis not present

## 2021-04-11 DIAGNOSIS — Z171 Estrogen receptor negative status [ER-]: Secondary | ICD-10-CM

## 2021-04-11 DIAGNOSIS — C50812 Malignant neoplasm of overlapping sites of left female breast: Secondary | ICD-10-CM

## 2021-04-11 DIAGNOSIS — R6889 Other general symptoms and signs: Secondary | ICD-10-CM | POA: Diagnosis not present

## 2021-04-11 DIAGNOSIS — C50211 Malignant neoplasm of upper-inner quadrant of right female breast: Secondary | ICD-10-CM | POA: Diagnosis not present

## 2021-04-11 DIAGNOSIS — D701 Agranulocytosis secondary to cancer chemotherapy: Secondary | ICD-10-CM | POA: Diagnosis not present

## 2021-04-11 DIAGNOSIS — Z79899 Other long term (current) drug therapy: Secondary | ICD-10-CM | POA: Diagnosis not present

## 2021-04-11 MED ORDER — FILGRASTIM-SNDZ 300 MCG/0.5ML IJ SOSY
PREFILLED_SYRINGE | INTRAMUSCULAR | Status: AC
  Filled 2021-04-11: qty 0.5

## 2021-04-11 MED ORDER — FILGRASTIM-SNDZ 300 MCG/0.5ML IJ SOSY
300.0000 ug | PREFILLED_SYRINGE | Freq: Once | INTRAMUSCULAR | Status: AC
  Administered 2021-04-11: 300 ug via SUBCUTANEOUS

## 2021-04-11 NOTE — Patient Instructions (Signed)
Filgrastim, G-CSF injection What is this medication? FILGRASTIM, G-CSF (fil GRA stim) is a granulocyte colony-stimulating factor that stimulates the growth of neutrophils, a type of white blood cell (WBC) important in the body's fight against infection. It is used to reduce the incidence of fever and infection in patients with certain types of cancer who are receiving chemotherapy that affects the bone marrow, to stimulate blood cell production for removal of WBCs from the body prior to a bone marrow transplantation, to reduce the incidence of fever and infection in patients who have severe chronic neutropenia, and to improve survival outcomes following high-dose radiation exposure that is toxic to the bone marrow. This medicine may be used for other purposes; ask your health care provider or pharmacist if you have questions. COMMON BRAND NAME(S): Neupogen, Nivestym, Releuko, Zarxio What should I tell my care team before I take this medication? They need to know if you have any of these conditions: kidney disease latex allergy ongoing radiation therapy sickle cell disease an unusual or allergic reaction to filgrastim, pegfilgrastim, other medicines, foods, dyes, or preservatives pregnant or trying to get pregnant breast-feeding How should I use this medication? This medicine is for injection under the skin or infusion into a vein. As an infusion into a vein, it is usually given by a health care professional in a hospital or clinic setting. If you get this medicine at home, you will be taught how to prepare and give this medicine. Refer to the Instructions for Use that come with your medication packaging. Use exactly as directed. Take your medicine at regular intervals. Do not take your medicine more often than directed. It is important that you put your used needles and syringes in a special sharps container. Do not put them in a trash can. If you do not have a sharps container, call your pharmacist  or healthcare provider to get one. Talk to your pediatrician regarding the use of this medicine in children. While this drug may be prescribed for children as young as 7 months for selected conditions, precautions do apply. Overdosage: If you think you have taken too much of this medicine contact a poison control center or emergency room at once. NOTE: This medicine is only for you. Do not share this medicine with others. What if I miss a dose? It is important not to miss your dose. Call your doctor or health care professional if you miss a dose. What may interact with this medication? This medicine may interact with the following medications: medicines that may cause a release of neutrophils, such as lithium This list may not describe all possible interactions. Give your health care provider a list of all the medicines, herbs, non-prescription drugs, or dietary supplements you use. Also tell them if you smoke, drink alcohol, or use illegal drugs. Some items may interact with your medicine. What should I watch for while using this medication? Your condition will be monitored carefully while you are receiving this medicine. You may need blood work done while you are taking this medicine. Talk to your health care provider about your risk of cancer. You may be more at risk for certain types of cancer if you take this medicine. What side effects may I notice from receiving this medication? Side effects that you should report to your doctor or health care professional as soon as possible: allergic reactions like skin rash, itching or hives, swelling of the face, lips, or tongue back pain dizziness or feeling faint fever pain, redness, or   irritation at site where injected pinpoint red spots on the skin shortness of breath or breathing problems signs and symptoms of kidney injury like trouble passing urine, change in the amount of urine, or red or dark-brown urine stomach or side pain, or pain at  the shoulder swelling tiredness unusual bleeding or bruising Side effects that usually do not require medical attention (report to your doctor or health care professional if they continue or are bothersome): bone pain cough diarrhea hair loss headache muscle pain This list may not describe all possible side effects. Call your doctor for medical advice about side effects. You may report side effects to FDA at 1-800-FDA-1088. Where should I keep my medication? Keep out of the reach of children. Store in a refrigerator between 2 and 8 degrees C (36 and 46 degrees F). Do not freeze. Keep in carton to protect from light. Throw away this medicine if vials or syringes are left out of the refrigerator for more than 24 hours. Throw away any unused medicine after the expiration date. NOTE: This sheet is a summary. It may not cover all possible information. If you have questions about this medicine, talk to your doctor, pharmacist, or health care provider.  2022 [employer] (2019-10-08 18:47:55)  

## 2021-04-13 ENCOUNTER — Other Ambulatory Visit: Payer: 59

## 2021-04-13 ENCOUNTER — Inpatient Hospital Stay: Payer: 59 | Admitting: Adult Health

## 2021-04-13 ENCOUNTER — Inpatient Hospital Stay: Payer: 59

## 2021-04-13 ENCOUNTER — Other Ambulatory Visit: Payer: Self-pay

## 2021-04-13 ENCOUNTER — Encounter: Payer: Self-pay | Admitting: Adult Health

## 2021-04-13 VITALS — BP 140/83 | HR 96 | Temp 98.1°F | Resp 19 | Ht 67.0 in | Wt 301.4 lb

## 2021-04-13 DIAGNOSIS — C50511 Malignant neoplasm of lower-outer quadrant of right female breast: Secondary | ICD-10-CM | POA: Diagnosis not present

## 2021-04-13 DIAGNOSIS — Z170 Estrogen receptor positive status [ER+]: Secondary | ICD-10-CM

## 2021-04-13 DIAGNOSIS — Z95828 Presence of other vascular implants and grafts: Secondary | ICD-10-CM

## 2021-04-13 DIAGNOSIS — M539 Dorsopathy, unspecified: Secondary | ICD-10-CM

## 2021-04-13 DIAGNOSIS — R6889 Other general symptoms and signs: Secondary | ICD-10-CM | POA: Diagnosis not present

## 2021-04-13 DIAGNOSIS — Z171 Estrogen receptor negative status [ER-]: Secondary | ICD-10-CM

## 2021-04-13 DIAGNOSIS — C50211 Malignant neoplasm of upper-inner quadrant of right female breast: Secondary | ICD-10-CM | POA: Diagnosis not present

## 2021-04-13 DIAGNOSIS — C50812 Malignant neoplasm of overlapping sites of left female breast: Secondary | ICD-10-CM

## 2021-04-13 DIAGNOSIS — K760 Fatty (change of) liver, not elsewhere classified: Secondary | ICD-10-CM

## 2021-04-13 DIAGNOSIS — Z5111 Encounter for antineoplastic chemotherapy: Secondary | ICD-10-CM | POA: Diagnosis not present

## 2021-04-13 DIAGNOSIS — C50412 Malignant neoplasm of upper-outer quadrant of left female breast: Secondary | ICD-10-CM | POA: Diagnosis not present

## 2021-04-13 DIAGNOSIS — Z5189 Encounter for other specified aftercare: Secondary | ICD-10-CM | POA: Diagnosis not present

## 2021-04-13 DIAGNOSIS — Z79899 Other long term (current) drug therapy: Secondary | ICD-10-CM | POA: Diagnosis not present

## 2021-04-13 DIAGNOSIS — D701 Agranulocytosis secondary to cancer chemotherapy: Secondary | ICD-10-CM | POA: Diagnosis not present

## 2021-04-13 DIAGNOSIS — Z5112 Encounter for antineoplastic immunotherapy: Secondary | ICD-10-CM | POA: Diagnosis not present

## 2021-04-13 LAB — CBC WITH DIFFERENTIAL/PLATELET
Abs Immature Granulocytes: 0.52 10*3/uL — ABNORMAL HIGH (ref 0.00–0.07)
Basophils Absolute: 0 10*3/uL (ref 0.0–0.1)
Basophils Relative: 0 %
Eosinophils Absolute: 0 10*3/uL (ref 0.0–0.5)
Eosinophils Relative: 0 %
HCT: 33.3 % — ABNORMAL LOW (ref 36.0–46.0)
Hemoglobin: 11.5 g/dL — ABNORMAL LOW (ref 12.0–15.0)
Immature Granulocytes: 5 %
Lymphocytes Relative: 22 %
Lymphs Abs: 2.2 10*3/uL (ref 0.7–4.0)
MCH: 30.9 pg (ref 26.0–34.0)
MCHC: 34.5 g/dL (ref 30.0–36.0)
MCV: 89.5 fL (ref 80.0–100.0)
Monocytes Absolute: 0.9 10*3/uL (ref 0.1–1.0)
Monocytes Relative: 9 %
Neutro Abs: 6.2 10*3/uL (ref 1.7–7.7)
Neutrophils Relative %: 64 %
Platelets: 107 10*3/uL — ABNORMAL LOW (ref 150–400)
RBC: 3.72 MIL/uL — ABNORMAL LOW (ref 3.87–5.11)
RDW: 20.6 % — ABNORMAL HIGH (ref 11.5–15.5)
WBC: 9.8 10*3/uL (ref 4.0–10.5)
nRBC: 1 % — ABNORMAL HIGH (ref 0.0–0.2)

## 2021-04-13 LAB — COMPREHENSIVE METABOLIC PANEL
ALT: 25 U/L (ref 0–44)
AST: 14 U/L — ABNORMAL LOW (ref 15–41)
Albumin: 3.3 g/dL — ABNORMAL LOW (ref 3.5–5.0)
Alkaline Phosphatase: 78 U/L (ref 38–126)
Anion gap: 11 (ref 5–15)
BUN: 14 mg/dL (ref 6–20)
CO2: 25 mmol/L (ref 22–32)
Calcium: 10.1 mg/dL (ref 8.9–10.3)
Chloride: 104 mmol/L (ref 98–111)
Creatinine, Ser: 1.18 mg/dL — ABNORMAL HIGH (ref 0.44–1.00)
GFR, Estimated: 54 mL/min — ABNORMAL LOW (ref >60)
Glucose, Bld: 124 mg/dL — ABNORMAL HIGH (ref 70–99)
Potassium: 3.9 mmol/L (ref 3.5–5.1)
Sodium: 140 mmol/L (ref 135–145)
Total Bilirubin: 0.4 mg/dL (ref 0.3–1.2)
Total Protein: 6.7 g/dL (ref 6.5–8.1)

## 2021-04-13 LAB — TSH: TSH: 1.935 u[IU]/mL (ref 0.308–3.960)

## 2021-04-13 MED ORDER — SODIUM CHLORIDE 0.9 % IV SOLN
150.0000 mg | Freq: Once | INTRAVENOUS | Status: AC
  Administered 2021-04-13: 150 mg via INTRAVENOUS
  Filled 2021-04-13: qty 150

## 2021-04-13 MED ORDER — SODIUM CHLORIDE 0.9 % IV SOLN
10.0000 mg | Freq: Once | INTRAVENOUS | Status: AC
  Administered 2021-04-13: 10 mg via INTRAVENOUS
  Filled 2021-04-13: qty 10

## 2021-04-13 MED ORDER — SODIUM CHLORIDE 0.9% FLUSH
10.0000 mL | Freq: Once | INTRAVENOUS | Status: AC
Start: 2021-04-13 — End: 2021-04-13
  Administered 2021-04-13: 10 mL
  Filled 2021-04-13: qty 10

## 2021-04-13 MED ORDER — SODIUM CHLORIDE 0.9 % IV SOLN
200.0000 mg | Freq: Once | INTRAVENOUS | Status: AC
  Administered 2021-04-13: 200 mg via INTRAVENOUS
  Filled 2021-04-13: qty 8

## 2021-04-13 MED ORDER — PALONOSETRON HCL INJECTION 0.25 MG/5ML
0.2500 mg | Freq: Once | INTRAVENOUS | Status: AC
  Administered 2021-04-13: 0.25 mg via INTRAVENOUS

## 2021-04-13 MED ORDER — PALONOSETRON HCL INJECTION 0.25 MG/5ML
INTRAVENOUS | Status: AC
  Filled 2021-04-13: qty 5

## 2021-04-13 MED ORDER — SODIUM CHLORIDE 0.9% FLUSH
10.0000 mL | INTRAVENOUS | Status: DC | PRN
Start: 2021-04-13 — End: 2021-04-13
  Administered 2021-04-13: 10 mL
  Filled 2021-04-13: qty 10

## 2021-04-13 MED ORDER — DOXORUBICIN HCL CHEMO IV INJECTION 2 MG/ML
60.0000 mg/m2 | Freq: Once | INTRAVENOUS | Status: AC
  Administered 2021-04-13: 154 mg via INTRAVENOUS
  Filled 2021-04-13: qty 77

## 2021-04-13 MED ORDER — HEPARIN SOD (PORK) LOCK FLUSH 100 UNIT/ML IV SOLN
500.0000 [IU] | Freq: Once | INTRAVENOUS | Status: AC | PRN
  Administered 2021-04-13: 500 [IU]
  Filled 2021-04-13: qty 5

## 2021-04-13 MED ORDER — CYCLOPHOSPHAMIDE CHEMO INJECTION 1 GM
600.0000 mg/m2 | Freq: Once | INTRAMUSCULAR | Status: AC
  Administered 2021-04-13: 1540 mg via INTRAVENOUS
  Filled 2021-04-13: qty 77

## 2021-04-13 MED ORDER — ALTEPLASE 2 MG IJ SOLR
INTRAMUSCULAR | Status: AC
  Filled 2021-04-13: qty 2

## 2021-04-13 MED ORDER — SODIUM CHLORIDE 0.9 % IV SOLN
Freq: Once | INTRAVENOUS | Status: AC
  Filled 2021-04-13: qty 250

## 2021-04-13 MED ORDER — ALTEPLASE 2 MG IJ SOLR
2.0000 mg | Freq: Once | INTRAMUSCULAR | Status: AC
  Administered 2021-04-13: 2 mg
  Filled 2021-04-13: qty 2

## 2021-04-13 NOTE — Progress Notes (Signed)
Unable to get blood from pt's port. Cathflo was administered at [place] by Seth Bake, [place]. Pt advised to talk to dr about setting up a dye study on her port.

## 2021-04-13 NOTE — Progress Notes (Addendum)
[place]  [phone] [phone]   NB: this visit started as virtual with NP Dak Szumski. However with a change of treatment necessitated it was switched to in person and GM saw the patient in the clinic before changing the orders. It is being billed as an [place] visit.  ID: Leslie Wright DOB: 05/28/1966  MR#: [id number]  FTD#:[id number]  Patient Care Team: Fanny Bien, MD as PCP - General (Family Medicine) Herminio Commons, MD (Inactive) as PCP - Cardiology (Cardiology) Mauro Kaufmann, RN as Oncology Nurse Navigator Rockwell Germany, RN as Oncology Nurse Navigator Stark Klein, MD as Consulting Physician (General Surgery) Kyung Rudd, MD as Consulting Physician (Radiation Oncology) Scot Dock, NP OTHER MD:  I connected with Leslie Wright on 04/13/21 at  9:00 AM EDT by my chart video and verified that I am speaking with the correct person using two identifiers.  I discussed the limitations, risks, security and privacy concerns of performing an evaluation and management service by telephone and the availability of in person appointments.  I also discussed with the patient that there may be a patient responsible charge related to this service. The patient expressed understanding and agreed to proceed.  Patient location: [facility] Provider location: home Others participating: Dr. Jana Hakim   CHIEF COMPLAINT: Bilateral breast cancers  CURRENT TREATMENT: Neoadjuvant chemotherapy   INTERVAL HISTORY: Leslie Wright returns today for follow up and treatment of her bilateral breast cancers.   She began neoadjuvant chemotherapy, consisting of pembrolizumab, carboplatin, and paclitaxel, on 02/10/2021. Today is day 1 cycle 4.  She is due to receive treatment with Paclitaxel, Carboplatin and Pembrolizumab today.  She notes she is experiencing some mild peripheral neuropathy in her toes that is intermittent.  She also is experiencing fatigue, and has had  difficulty with her normal activities from feeling tired with the chemotherapy.    We are following her TSH noted below.    Results for Leslie Wright, Leslie Wright" (MRN [id number]) as of 04/13/2021 09:37  Ref. Range 09/27/2016 13:42 02/17/2021 08:15 02/23/2021 10:57 03/02/2021 09:01 03/23/2021 08:37  TSH Latest Ref Range: 0.308 - 3.960 uIU/mL 1.890 2.733 1.316 1.527 1.823    REVIEW OF SYSTEMS: Review of Systems  Constitutional:  Positive for fatigue. Negative for appetite change, chills, fever and unexpected weight change.  HENT:   Negative for hearing loss, lump/mass and trouble swallowing.   Eyes:  Negative for eye problems and icterus.  Respiratory:  Negative for chest tightness, cough and shortness of breath.   Cardiovascular:  Negative for chest pain, leg swelling and palpitations.  Gastrointestinal:  Negative for abdominal distention, abdominal pain, constipation, diarrhea, nausea and vomiting.  Endocrine: Negative for hot flashes.  Genitourinary:  Negative for difficulty urinating.   Musculoskeletal:  Negative for arthralgias.  Skin:  Negative for itching and rash.  Neurological:  Positive for numbness. Negative for dizziness, extremity weakness and headaches.  Hematological:  Negative for adenopathy. Does not bruise/bleed easily.  Psychiatric/Behavioral:  Negative for depression. The patient is not nervous/anxious.       COVID 19 VACCINATION STATUS: Status post [place] x2, no booster as of 01/24/2021   HISTORY OF CURRENT ILLNESS: From the original intake note:  Leslie Wright" presented with a palpable left breast lump. She underwent bilateral diagnostic mammography with tomography and bilateral breast ultrasonography at [facility] on 01/12/2021 showing: breast density category C; 5.8 cm mass in the left breast at 12:30 with associated calcifications; additional 1.2 cm group of  calcifications in the outer left breast;   On the right: Multiple small masses and irregular ducts  in the retroareolar right breast, including a 0.8 cm representative mass at 6 o'clock; indeterminate 0.4 cm mass in upper-inner right breast; normal lymph nodes bilaterally.  Accordingly on 01/16/2021 she proceeded to biopsy of the right breast areas in question. The pathology from this procedure ([id number]) showed:  1. Right Breast, 6 o'clock  - invasive ductal carcinoma, grade 2. Prognostic indicators significant for: estrogen receptor, 90% positive with strong staining intensity and progesterone receptor, 0% negative. Proliferation marker Ki67 at 5%. HER2 equivocal by immunohistochemistry (2+), but negative by fluorescent in situ hybridization with a signals ratio 1.61 and number per cell 2.5. 2. Right Breast, 9 o'clock  - fibrocystic change with adenosis, calcifications, and a small intraductal papilloma.  She underwent biopsy of the left breast areas on 01/20/2021. Pathology [id number]) revealed: 1. Left Breast, upper-outer quadrant  - invasive ductal carcinoma, grade 3 2. Left Breast, central, slightly lateral to midline  - ductal carcinoma in situ with necrosis and calcifications, intermediate grade Prognostic panels are pending on both samples.  Cancer Staging Malignant neoplasm of lower-outer quadrant of right breast of female, estrogen receptor positive ([place]) Staging form: Breast, AJCC 8th Edition - Clinical stage from 01/20/2021: Stage IA (cT1c, cN0, cM0, G2, ER+, PR-, HER2-) - Signed by Chauncey Cruel, MD on 01/25/2021 Stage prefix: Initial diagnosis Histologic grading system: 3 grade system  Malignant neoplasm of overlapping sites of left breast in female, estrogen receptor negative ([place]) Staging form: Breast, AJCC 8th Edition - Clinical stage from 01/20/2021: Stage IIIB (cT3, cN0, cM0, G3, ER-, PR-, HER2-) - Signed by Chauncey Cruel, MD on 01/25/2021 Histologic grading system: 3 grade system  The patient's subsequent history is as detailed below.   PAST MEDICAL  HISTORY: Past Medical History:  Diagnosis Date   Acid reflux    Anemia    Arthritis    "maybe in my fingers" (03/26/2018)   Breast cancer ([place]) 12/2020   both sides   Concussion 2001   no deficit had tia right after no problems since   Family history of adverse reaction to anesthesia    "brother, Timmy, and my mom always get sick" (03/26/2018)   Family history of brain cancer    Family history of breast cancer    Family history of colon cancer    Family history of lung cancer    Family history of pancreatic cancer    Family history of prostate cancer    Headache    High blood pressure    High cholesterol    Hip pain    "left; before OR" (03/26/2018)   History of chemotherapy last done 02-17-2021   History of hiatal hernia    History of kidney stones    OSA (obstructive sleep apnea)    "can't tolerate the mask" (03/26/2018)    PONV (postoperative nausea and vomiting)    does not need much anesthesia, slow to awlaekn in past   Pre-diabetes    Shortness of breath    with exertion   Wears partial dentures upper    PAST SURGICAL HISTORY: Past Surgical History:  Procedure Laterality Date   CARPAL TUNNEL RELEASE Right ~ 2006   JOINT REPLACEMENT     LAPAROSCOPIC CHOLECYSTECTOMY  12/2010   LASIK Bilateral 2000   PORT A CATH REVISION Left 02/21/2021   Procedure: PORT A CATH REVISION;  Surgeon: Stark Klein, MD;  Location: [facility];  Service: General;  Laterality: Left;  60 MINUTES ROOM 5   PORTACATH PLACEMENT N/A 02/08/2021   Procedure: INSERTION PORT-A-CATH;  Surgeon: Stark Klein, MD;  Location: [facility];  Service: General;  Laterality: N/A;   TOTAL HIP ARTHROPLASTY Left 03/25/2018   Procedure: LEFT TOTAL HIP ARTHROPLASTY ANTERIOR APPROACH;  Surgeon: Mcarthur Rossetti, MD;  Location: [facility];  Service: Orthopedics;  Laterality: Left;    FAMILY HISTORY: Family History  Problem Relation Age of Onset   Diabetes Mother    Thyroid disease Mother    Liver  disease Mother    Obesity Mother    Hypertension Father    Hyperlipidemia Father    Heart disease Father    Stroke Father    Obesity Father    Colon polyps Father    Prostate cancer Brother    Brain cancer Maternal Grandfather    Pancreatic cancer Paternal Grandmother    Colon cancer Neg Hx        No one in family with colon cancer.  The patient's father is 68 years old as of 02-17-2021.  The patient's mother died from cirrhosis associated with hepatitis C (from a transfusion) age 68.  The patient has 3 brothers, no sisters.  1 brother has a history of prostate cancer at age 35.  A maternal grandfather had a history of central nervous system carcinoma.  A paternal grandmother had a history of pancreatic cancer and a maternal aunt had a history of breast cancer in her late 22s.   GYNECOLOGIC HISTORY:  No LMP recorded (lmp unknown). Patient is postmenopausal. Menarche: 55 years old [place] P 0 LMP 53 HRT no  Hysterectomy? no BSO? no   SOCIAL HISTORY: (updated 02/17/21)  Rosaria Ferries "Leslie Wright" works for [employer] in [employer].  She works from home.  She is a Engineer, mining.  She describes herself as single, lives by herself, with a toy poodle and a [place] as well as 2 cats.  ADVANCED DIRECTIVES: The patient's brother Kalle Bernath is her healthcare power of attorney.  He can be reached at [phone]   HEALTH MAINTENANCE: Social History   Tobacco Use   Smoking status: Former    Types: Cigarettes   Smokeless tobacco: Never   Tobacco comments:    In teens social  Vaping Use   Vaping Use: Never used  Substance Use Topics   Alcohol use: Yes    Comment: Occasional   Drug use: Never     Colonoscopy:   PAP: 03/2011  Bone density: scheduled for 07/2021   No Known Allergies  Current Outpatient Medications  Medication Sig Dispense Refill   acetaminophen (TYLENOL) 500 MG tablet Take 1,000 mg by mouth every 6 (six) hours as needed for moderate pain.     amoxicillin-clavulanate  (AUGMENTIN) 875-125 MG tablet Take 1 tablet by mouth 2 (two) times daily. 14 tablet 0   aspirin EC 81 MG tablet Take 81 mg by mouth daily.     betamethasone valerate ointment (VALISONE) 0.1 % Apply 1 application topically 2 (two) times daily. 30 g 0   Blood Glucose Monitoring Suppl (FREESTYLE LITE) w/Device KIT CHECK GLUCOSE 2-3 TIMES DAILY 1 kit 0   Cholecalciferol (VITAMIN D) 50 MCG (2000 UT) CAPS Take 2,000 Units by mouth daily.     clindamycin (CLINDAGEL) 1 % gel Apply topically 2 (two) times daily. 30 g 0   dexamethasone (DECADRON) 4 MG tablet Take 2 tablets once a day for 3 days after carboplatin and  AC chemotherapy. Take with food. 30 tablet 1   ELDERBERRY PO Take 1 capsule by mouth daily.     folic acid (FOLVITE) 1 MG tablet Take 1 mg by mouth daily.     glucose blood test strip USE AS DIRECTED TO CHECK BLOOD SUGAR 1 TO 3 TIMES A DAY (Patient taking differently: USE AS DIRECTED TO CHECK BLOOD SUGAR 1 TO 3 TIMES A DAY) 100 strip 3   ketoconazole (NIZORAL) 2 % cream Apply 1 application topically daily. (Patient taking differently: Apply 1 application topically daily as needed.) 15 g 0   Lancets (FREESTYLE) lancets USE AS DIRECTED TO CHECK BLOOD SUGAR 2 TO 3 TIMES A DAY (Patient taking differently: USE AS DIRECTED TO CHECK BLOOD SUGAR 2 TO 3 TIMES A DAY) 100 each 3   lidocaine-prilocaine (EMLA) cream Apply to affected area once 30 g 3   losartan (COZAAR) 100 MG tablet Take 1 tablet by mouth daily 90 tablet 3   methotrexate (RHEUMATREX) 15 MG tablet Take 15 mg by mouth every Wednesday. Caution: Chemotherapy. Protect from light.     Multiple Vitamin (MULTIVITAMIN WITH MINERALS) TABS tablet Take 1 tablet by mouth daily.     omeprazole (PRILOSEC) 20 MG capsule Take 20 mg by mouth daily.     ondansetron (ZOFRAN) 8 MG tablet Take 1 tablet (8 mg total) by mouth 2 (two) times daily as needed. Start on the third day after carboplatin and AC chemotherapy. 30 tablet 1   prochlorperazine (COMPAZINE) 10 MG  tablet Take 1 tablet (10 mg total) by mouth every 6 (six) hours as needed (Nausea or vomiting). 30 tablet 1   rosuvastatin (CRESTOR) 10 MG tablet TAKE 1 TABLET BY MOUTH ONCE A DAY AT BEDTIME FOR CHOLESTEROL (Patient taking differently: Take 10 mg by mouth at bedtime.) 90 tablet 1   No current facility-administered medications for this visit.    OBJECTIVE:   Vitals:   04/13/21 0918  BP: 140/83  Pulse: 96  Resp: 19  Temp: 98.1 F (36.7 C)  SpO2: 98%      Body mass index is 47.21 kg/m.   Wt Readings from Last 3 Encounters:  04/13/21 (!) 301 lb 6.4 oz (136.7 kg)  04/06/21 (!) 302 lb 14.4 oz (137.4 kg)  03/23/21 (!) 305 lb 6.4 oz (138.5 kg)   Patient sounds well, in no apparent distress.  Mood and behavior are normal.  Breathing is non labored.      LAB RESULTS:  CMP     Component Value Date/Time   NA 140 04/13/2021 0851   NA 143 12/02/2018 1138   K 3.9 04/13/2021 0851   CL 104 04/13/2021 0851   CO2 25 04/13/2021 0851   GLUCOSE 124 (H) 04/13/2021 0851   BUN 14 04/13/2021 0851   BUN 17 12/02/2018 1138   CREATININE 1.18 (H) 04/13/2021 0851   CREATININE 1.31 (H) 03/16/2021 0850   CREATININE 1.09 (H) 02/18/2019 1524   CALCIUM 10.1 04/13/2021 0851   PROT 6.7 04/13/2021 0851   PROT 6.9 12/02/2018 1138   ALBUMIN 3.3 (L) 04/13/2021 0851   ALBUMIN 4.0 12/02/2018 1138   AST 14 (L) 04/13/2021 0851   AST 19 03/16/2021 0850   ALT 25 04/13/2021 0851   ALT 28 03/16/2021 0850   ALKPHOS 78 04/13/2021 0851   BILITOT 0.4 04/13/2021 0851   BILITOT <0.1 (L) 03/16/2021 0850   GFRNONAA 54 (L) 04/13/2021 0851   GFRNONAA 48 (L) 03/16/2021 0850   GFRAA 70 12/02/2018 1138  No results found for: TOTALPROTELP, ALBUMINELP, A1GS, A2GS, BETS, BETA2SER, GAMS, MSPIKE, SPEI  Lab Results  Component Value Date   WBC 9.8 04/13/2021   NEUTROABS 6.2 04/13/2021   HGB 11.5 (L) 04/13/2021   HCT 33.3 (L) 04/13/2021   MCV 89.5 04/13/2021   PLT 107 (L) 04/13/2021    No results found for:  LABCA2  No components found for: KGYJEH631  No results for input(s): INR in the last 168 hours.  No results found for: LABCA2  No results found for: SHF026  No results found for: VZC588  No results found for: FOY774  No results found for: CA2729  No components found for: HGQUANT  No results found for: CEA1 / No results found for: CEA1   No results found for: AFPTUMOR  No results found for: CHROMOGRNA  No results found for: KPAFRELGTCHN, LAMBDASER, KAPLAMBRATIO (kappa/lambda light chains)  No results found for: HGBA, HGBA2QUANT, HGBFQUANT, HGBSQUAN (Hemoglobinopathy evaluation)   No results found for: LDH  No results found for: IRON, TIBC, IRONPCTSAT (Iron and TIBC)  No results found for: FERRITIN  Urinalysis    Component Value Date/Time   COLORURINE YELLOW 10/23/2010 2031   APPEARANCEUR CLEAR 10/23/2010 2031   LABSPEC 1.025 10/23/2010 2031   PHURINE 6.0 10/23/2010 2031   GLUCOSEU NEGATIVE 03/12/2009 0843   HGBUR MODERATE (A) 10/23/2010 2031   BILIRUBINUR NEGATIVE 10/23/2010 2031   KETONESUR NEGATIVE 10/23/2010 2031   PROTEINUR TRACE (A) 10/23/2010 2031   UROBILINOGEN 0.2 10/23/2010 2031   NITRITE NEGATIVE 10/23/2010 2031   LEUKOCYTESUR NEGATIVE 10/23/2010 2031    STUDIES: No results found.    ELIGIBLE FOR AVAILABLE RESEARCH PROTOCOL:   ASSESSMENT: 55 y.o. [place] woman presenting with bilateral breast cancers April 2022 as follows:  (1) in the right breast, a clinical T2 N0-1, stage IIA-IIB invasive ductal carcinoma, grade 2, estrogen receptor positive, progesterone receptor and HER2 negative, with an MIB-1 of 5%;  (a)  a second, retroareolar mass was benign but discordant; biopsy 02/07/2021  (b) borderline right axillary node not amenable to biopsy  (c) extensive patchy enhancement noted throughout the inferior right breast  (2) in the left breast, a clinical T3 N0, stage IIIB invasive ductal carcinoma, grade 3, functionally triple negative, with an  MIB-1 of 25% (HER2 results are pending).  (a) secondary biopsy shows ductal carcinoma in situ associated with extensive patchy enhancement  (3) staging studies:  (a) chest CT scan 02/06/2021 shows hepatic steatosis and degenerative disc disease, no evidence of metastatic disease  (b) bone scan 02/06/2021 shows no evidence of metastatic disease  (4) neoadjuvant chemotherapy will start 02/09/2021 consisting of pembrolizumab/carboplatin/paclitaxel stopped one cycle early due to peripheral neuropathy followed by pembrolizumab/doxorubicin/cyclophosphamide  (5) definitive surgery to follow  (6) adjuvant radiation to bilateral sites  (7) Negative genetic testing on 02/13/2021. VUS in ATM called c.1132A>G identified.  Genes tested include: AIP, ALK, APC, ATM, AXIN2,BAP1,  BARD1, BLM, BMPR1A, BRCA1, BRCA2, BRIP1, CASR, CDC73, CDH1, CDK4, CDKN1B, CDKN1C, CDKN2A (p14ARF), CDKN2A (p16INK4a), CEBPA, CHEK2, CTNNA1, DICER1, DIS3L2, EGFR (c.2369C>T, p.Thr790Met variant only), EPCAM (Deletion/duplication testing only), FH, FLCN, GATA2, GPC3, GREM1 (Promoter region deletion/duplication testing only), HOXB13 (c.251G>A, p.Gly84Glu), HRAS, KIT, MAX, MEN1, MET, MITF (c.952G>A, p.Glu318Lys variant only), MLH1, MSH2, MSH3, MSH6, MUTYH, NBN, NF1, NF2, NTHL1, PALB2, PDGFRA, PHOX2B, PMS2, POLD1, POLE, POT1, PRKAR1A, PTCH1, PTEN, RAD50, RAD51C, RAD51D, RB1, RECQL4, RET, RUNX1, SDHAF2, SDHA (sequence changes only), SDHB, SDHC, SDHD, SMAD4, SMARCA4, SMARCB1, SMARCE1, STK11, SUFU, TERC, TERT, TMEM127, TP53, TSC1, TSC2, VHL, WRN and WT1.  PLAN: Leslie Wright is doing moderately well. Her labs are stable.  She has continued on neoadjuvant chemotherapy with Paclitaxel, Carboplatin, and Pembrolizumab completed 3 out of 4 of the cycles.  Leslie Wright has developed peripheral neuropathy in the pads of her toes, and due to this side effect, she will move onto receive Doxorubicin, Cyclophosphamide, and Pembrolizumab today.    Dr. Jana Hakim joined the visit  and reviewed this change with Leslie Wright and adjusted the orders.  I encouraged her to stay active, and small amounts of exercise can reduce her fatigue level.    I reviewed with Leslie Wright how to take her anti nausea medications.  She will return in 2 days for her growth factor injection, and then we will see her in 1 week for labs and f/u, and in 3 weeks for her next treatment.    She knows to call for any questions that may arise between now and her next appointment.  We are happy to see her sooner if needed.  Total encounter time 25 minutes     Wilber Bihari, NP 04/13/21 9:39 AM Medical Oncology and Hematology [facility] [place], [place] [address] Tel. [phone]    Fax. [phone]    *Total Encounter Time as defined by the Centers for Medicare and Medicaid Services includes, in addition to the face-to-face time of a patient visit (documented in the note above) non-face-to-face time: obtaining and reviewing outside history, ordering and reviewing medications, tests or procedures, care coordination (communications with other health care professionals or caregivers) and documentation in the medical record.   ADDENDUM: Leslie Wright has developed convincing symptoms of neuropathy and we are stopping her taxanes. The symptoms are grade 1 and hopefully will resolve over time.  We are accordingly switching to the second part of her neoadjuvant therapy, AC/K. We discussed the different schedule involved and the importance of her receiving her day 3 immune booster. She will start the cycle today.  I personally saw this patient and performed a substantive portion of this encounter with the listed APP documented above.   Chauncey Cruel, MD Medical Oncology and Hematology [facility] [address] [place], [place] [address] Tel. [phone]    Fax. [phone]

## 2021-04-13 NOTE — Patient Instructions (Signed)
[facility]  Discharge Instructions: Thank you for choosing [facility] to provide your oncology and hematology care.   If you have a lab appointment with the [facility], please go directly to the [facility] and check in at the registration area.   Wear comfortable clothing and clothing appropriate for easy access to any Portacath or PICC line.   We strive to give you quality time with your provider. You may need to reschedule your appointment if you arrive late (15 or more minutes).  Arriving late affects you and other patients whose appointments are after yours.  Also, if you miss three or more appointments without notifying the office, you may be dismissed from the clinic at the provider's discretion.      For prescription refill requests, have your pharmacy contact our office and allow 72 hours for refills to be completed.    Today you received the following chemotherapy and/or immunotherapy agents: Keytruda, Adriamycin, Cytoxan.      To help prevent nausea and vomiting after your treatment, we encourage you to take your nausea medication as directed.  BELOW ARE SYMPTOMS THAT SHOULD BE REPORTED IMMEDIATELY: *FEVER GREATER THAN 100.4 F (38 C) OR HIGHER *CHILLS OR SWEATING *NAUSEA AND VOMITING THAT IS NOT CONTROLLED WITH YOUR NAUSEA MEDICATION *UNUSUAL SHORTNESS OF BREATH *UNUSUAL BRUISING OR BLEEDING *URINARY PROBLEMS (pain or burning when urinating, or frequent urination) *BOWEL PROBLEMS (unusual diarrhea, constipation, pain near the anus) TENDERNESS IN MOUTH AND THROAT WITH OR WITHOUT PRESENCE OF ULCERS (sore throat, sores in mouth, or a toothache) UNUSUAL RASH, SWELLING OR PAIN  UNUSUAL VAGINAL DISCHARGE OR ITCHING   Items with * indicate a potential emergency and should be followed up as soon as possible or go to the Emergency Department if any problems should occur.  Please show the CHEMOTHERAPY ALERT CARD or IMMUNOTHERAPY ALERT  CARD at check-in to the Emergency Department and triage nurse.  Should you have questions after your visit or need to cancel or reschedule your appointment, please contact [facility]  Dept: [phone]  and follow the prompts.  Office hours are 8:00 a.m. to 4:30 p.m. Monday - Friday. Please note that voicemails left after 4:00 p.m. may not be returned until the following business day.  We are closed weekends and major holidays. You have access to a nurse at all times for urgent questions. Please call the main number to the clinic Dept: [phone] and follow the prompts.   For any non-urgent questions, you may also contact your provider using MyChart. We now offer e-Visits for anyone 63 and older to request care online for non-urgent symptoms. For details visit [email or web address].   Also download the MyChart app! Go to the app store, search "MyChart", open the app, select [facility], and log in with your MyChart username and password.  Due to Covid, a mask is required upon entering the hospital/clinic. If you do not have a mask, one will be given to you upon arrival. For doctor visits, patients may have 1 support person aged 42 or older with them. For treatment visits, patients cannot have anyone with them due to current Covid guidelines and our immunocompromised population.   Pembrolizumab injection What is this medication? PEMBROLIZUMAB (pem broe liz ue mab) is a monoclonal antibody. It is used totreat certain types of cancer. This medicine may be used for other purposes; ask your health care provider orpharmacist if you have questions. COMMON BRAND NAME(S): Keytruda What  should I tell my care team before I take this medication? They need to know if you have any of these conditions: autoimmune diseases like Crohn's disease, ulcerative colitis, or lupus have had or planning to have an allogeneic stem cell transplant (uses someone else's stem cells) history  of organ transplant history of chest radiation nervous system problems like myasthenia gravis or Guillain-Barre syndrome an unusual or allergic reaction to pembrolizumab, other medicines, foods, dyes, or preservatives pregnant or trying to get pregnant breast-feeding How should I use this medication? This medicine is for infusion into a vein. It is given by a health careprofessional in a hospital or clinic setting. A special MedGuide will be given to you before each treatment. Be sure to readthis information carefully each time. Talk to your pediatrician regarding the use of this medicine in children. While this drug may be prescribed for children as young as 6 months for selectedconditions, precautions do apply. Overdosage: If you think you have taken too much of this medicine contact apoison control center or emergency room at once. NOTE: This medicine is only for you. Do not share this medicine with others. What if I miss a dose? It is important not to miss your dose. Call your doctor or health careprofessional if you are unable to keep an appointment. What may interact with this medication? Interactions have not been studied. This list may not describe all possible interactions. Give your health care provider a list of all the medicines, herbs, non-prescription drugs, or dietary supplements you use. Also tell them if you smoke, drink alcohol, or use illegaldrugs. Some items may interact with your medicine. What should I watch for while using this medication? Your condition will be monitored carefully while you are receiving thismedicine. You may need blood work done while you are taking this medicine. Do not become pregnant while taking this medicine or for 4 months after stopping it. Women should inform their doctor if they wish to become pregnant or think they might be pregnant. There is a potential for serious side effects to an unborn child. Talk to your health care professional or  pharmacist for more information. Do not breast-feed an infant while taking this medicine orfor 4 months after the last dose. What side effects may I notice from receiving this medication? Side effects that you should report to your doctor or health care professionalas soon as possible: allergic reactions like skin rash, itching or hives, swelling of the face, lips, or tongue bloody or black, tarry breathing problems changes in vision chest pain chills confusion constipation cough diarrhea dizziness or feeling faint or lightheaded fast or irregular heartbeat fever flushing joint pain low blood counts - this medicine may decrease the number of white blood cells, red blood cells and platelets. You may be at increased risk for infections and bleeding. muscle pain muscle weakness pain, tingling, numbness in the hands or feet persistent headache redness, blistering, peeling or loosening of the skin, including inside the mouth signs and symptoms of high blood sugar such as dizziness; dry mouth; dry skin; fruity breath; nausea; stomach pain; increased hunger or thirst; increased urination signs and symptoms of kidney injury like trouble passing urine or change in the amount of urine signs and symptoms of liver injury like dark urine, light-colored stools, loss of appetite, nausea, right upper belly pain, yellowing of the eyes or skin sweating swollen lymph nodes weight loss Side effects that usually do not require medical attention (report to yourdoctor or health care professional if  they continue or are bothersome): decreased appetite hair loss tiredness This list may not describe all possible side effects. Call your doctor for medical advice about side effects. You may report side effects to FDA at1-800-FDA-1088. Where should I keep my medication? This drug is given in a hospital or clinic and will not be stored at home. NOTE: This sheet is a summary. It may not cover all possible  information. If you have questions about this medicine, talk to your doctor, pharmacist, orhealth care provider.  2022 Elsevier/Gold Standard (2019-08-19 21:44:53)  Doxorubicin injection What is this medication? DOXORUBICIN (dox oh ROO bi sin) is a chemotherapy drug. It is used to treat many kinds of cancer like leukemia, lymphoma, neuroblastoma, sarcoma, and Wilms' tumor. It is also used to treat bladder cancer, breast cancer, lungcancer, ovarian cancer, stomach cancer, and thyroid cancer. This medicine may be used for other purposes; ask your health care provider orpharmacist if you have questions. COMMON BRAND NAME(S): Adriamycin, Adriamycin PFS, Adriamycin RDF, Rubex What should I tell my care team before I take this medication? They need to know if you have any of these conditions: heart disease history of low blood counts caused by a medicine liver disease recent or ongoing radiation therapy an unusual or allergic reaction to doxorubicin, other chemotherapy agents, other medicines, foods, dyes, or preservatives pregnant or trying to get pregnant breast-feeding How should I use this medication? This drug is given as an infusion into a vein. It is administered in a hospital or clinic by a specially trained health care professional. If you have pain, swelling, burning or any unusual feeling around the site of your injection,tell your health care professional right away. Talk to your pediatrician regarding the use of this medicine in children.Special care may be needed. Overdosage: If you think you have taken too much of this medicine contact apoison control center or emergency room at once. NOTE: This medicine is only for you. Do not share this medicine with others. What if I miss a dose? It is important not to miss your dose. Call your doctor or health careprofessional if you are unable to keep an appointment. What may interact with this medication? This medicine may interact with the  following medications: 6-mercaptopurine paclitaxel phenytoin St. John's Wort trastuzumab verapamil This list may not describe all possible interactions. Give your health care provider a list of all the medicines, herbs, non-prescription drugs, or dietary supplements you use. Also tell them if you smoke, drink alcohol, or use illegaldrugs. Some items may interact with your medicine. What should I watch for while using this medication? This drug may make you feel generally unwell. This is not uncommon, as chemotherapy can affect healthy cells as well as cancer cells. Report any side effects. Continue your course of treatment even though you feel ill unless yourdoctor tells you to stop. There is a maximum amount of this medicine you should receive throughout your life. The amount depends on the medical condition being treated and your overall health. Your doctor will watch how much of this medicine you receive inyour lifetime. Tell your doctor if you have taken this medicine before. You may need blood work done while you are taking this medicine. Your urine may turn red for a few days after your dose. This is not blood. Ifyour urine is dark or brown, call your doctor. In some cases, you may be given additional medicines to help with side effects.Follow all directions for their use. Call your doctor or health care  professional for advice if you get a fever, chills or sore throat, or other symptoms of a cold or flu. Do not treat yourself. This drug decreases your body's ability to fight infections. Try toavoid being around people who are sick. This medicine may increase your risk to bruise or bleed. Call your doctor orhealth care professional if you notice any unusual bleeding. Talk to your doctor about your risk of cancer. You may be more at risk forcertain types of cancers if you take this medicine. Do not become pregnant while taking this medicine or for 6 months after stopping it. Women should inform  their doctor if they wish to become pregnant or think they might be pregnant. Men should not father a child while taking this medicine and for 6 months after stopping it. There is a potential for serious side effects to an unborn child. Talk to your health care professional or pharmacist for more information. Do not breast-feed an infant while takingthis medicine. This medicine has caused ovarian failure in some women and reduced sperm counts in some men This medicine may interfere with the ability to have a child. Talk with your doctor or health care professional if you are concerned about yourfertility. This medicine may cause a decrease in Co-Enzyme Q-10. You should make sure that you get enough Co-Enzyme Q-10 while you are taking this medicine. Discuss thefoods you eat and the vitamins you take with your health care professional. What side effects may I notice from receiving this medication? Side effects that you should report to your doctor or health care professionalas soon as possible: allergic reactions like skin rash, itching or hives, swelling of the face, lips, or tongue breathing problems chest pain fast or irregular heartbeat low blood counts - this medicine may decrease the number of white blood cells, red blood cells and platelets. You may be at increased risk for infections and bleeding. pain, redness, or irritation at site where injected signs of infection - fever or chills, cough, sore throat, pain or difficulty passing urine signs of decreased platelets or bleeding - bruising, pinpoint red spots on the skin, black, tarry stools, blood in the urine swelling of the ankles, feet, hands tiredness weakness Side effects that usually do not require medical attention (report to yourdoctor or health care professional if they continue or are bothersome): diarrhea hair loss mouth sores nail discoloration or damage nausea red colored urine vomiting This list may not describe all  possible side effects. Call your doctor for medical advice about side effects. You may report side effects to FDA at1-800-FDA-1088. Where should I keep my medication? This drug is given in a hospital or clinic and will not be stored at home. NOTE: This sheet is a summary. It may not cover all possible information. If you have questions about this medicine, talk to your doctor, pharmacist, orhealth care provider.  2022 Elsevier/Gold Standard (2017-05-01 11:01:26)  Cyclophosphamide Injection What is this medication? CYCLOPHOSPHAMIDE (sye kloe FOSS fa mide) is a chemotherapy drug. It slows the growth of cancer cells. This medicine is used to treat many types of cancer like lymphoma, myeloma, leukemia, breast cancer, and ovarian cancer, to name afew. This medicine may be used for other purposes; ask your health care provider orpharmacist if you have questions. COMMON BRAND NAME(S): Cytoxan, Neosar What should I tell my care team before I take this medication? They need to know if you have any of these conditions: heart disease history of irregular heartbeat infection kidney disease liver disease  low blood counts, like white cells, platelets, or red blood cells on hemodialysis recent or ongoing radiation therapy scarring or thickening of the lungs trouble passing urine an unusual or allergic reaction to cyclophosphamide, other medicines, foods, dyes, or preservatives pregnant or trying to get pregnant breast-feeding How should I use this medication? This drug is usually given as an injection into a vein or muscle or by infusion into a vein. It is administered in a hospital or clinic by a specially trainedhealth care professional. Talk to your pediatrician regarding the use of this medicine in children.Special care may be needed. Overdosage: If you think you have taken too much of this medicine contact apoison control center or emergency room at once. NOTE: This medicine is only for you. Do  not share this medicine with others. What if I miss a dose? It is important not to miss your dose. Call your doctor or health careprofessional if you are unable to keep an appointment. What may interact with this medication? amphotericin B azathioprine certain antivirals for HIV or hepatitis certain medicines for blood pressure, heart disease, irregular heart beat certain medicines that treat or prevent blood clots like warfarin certain other medicines for cancer cyclosporine etanercept indomethacin medicines that relax muscles for surgery medicines to increase blood counts metronidazole This list may not describe all possible interactions. Give your health care provider a list of all the medicines, herbs, non-prescription drugs, or dietary supplements you use. Also tell them if you smoke, drink alcohol, or use illegaldrugs. Some items may interact with your medicine. What should I watch for while using this medication? Your condition will be monitored carefully while you are receiving thismedicine. You may need blood work done while you are taking this medicine. Drink water or other fluids as directed. Urinate often, even at night. Some products may contain alcohol. Ask your health care professional if this medicine contains alcohol. Be sure to tell all health care professionals you are taking this medicine. Certain medicines, like metronidazole and disulfiram, can cause an unpleasant reaction when taken with alcohol. The reaction includes flushing, headache, nausea, vomiting, sweating, and increased thirst. Thereaction can last from 30 minutes to several hours. Do not become pregnant while taking this medicine or for 1 year after stopping it. Women should inform their health care professional if they wish to become pregnant or think they might be pregnant. Men should not father a child while taking this medicine and for 4 months after stopping it. There is potential for serious side effects  to an unborn child. Talk to your health care professionalfor more information. Do not breast-feed an infant while taking this medicine or for 1 week afterstopping it. This medicine has caused ovarian failure in some women. This medicine may make it more difficult to get pregnant. Talk to your health care professional if [place] concerned about your fertility. This medicine has caused decreased sperm counts in some men. This may make it more difficult to father a child. Talk to your health care professional if [place] concerned about your fertility. Call your health care professional for advice if you get a fever, chills, or sore throat, or other symptoms of a cold or flu. Do not treat yourself. This medicine decreases your body's ability to fight infections. Try to avoid beingaround people who are sick. Avoid taking medicines that contain aspirin, acetaminophen, ibuprofen, naproxen, or ketoprofen unless instructed by your health care professional.These medicines may hide a fever. Talk to your health care professional about your risk of  cancer. You may bemore at risk for certain types of cancer if you take this medicine. If you are going to need surgery or other procedure, tell your health careprofessional that you are using this medicine. Be careful brushing or flossing your teeth or using a toothpick because you may get an infection or bleed more easily. If you have any dental work done, Primary school teacher you are receiving this medicine. What side effects may I notice from receiving this medication? Side effects that you should report to your doctor or health care professionalas soon as possible: allergic reactions like skin rash, itching or hives, swelling of the face, lips, or tongue breathing problems nausea, vomiting signs and symptoms of bleeding such as bloody or black, tarry stools; red or dark brown urine; spitting up blood or brown material that looks like coffee grounds; red spots on the skin;  unusual bruising or bleeding from the eyes, gums, or nose signs and symptoms of heart failure like fast, irregular heartbeat, sudden weight gain; swelling of the ankles, feet, hands signs and symptoms of infection like fever; chills; cough; sore throat; pain or trouble passing urine signs and symptoms of kidney injury like trouble passing urine or change in the amount of urine signs and symptoms of liver injury like dark yellow or brown urine; general ill feeling or flu-like symptoms; light-colored stools; loss of appetite; nausea; right upper belly pain; unusually weak or tired; yellowing of the eyes or skin Side effects that usually do not require medical attention (report to yourdoctor or health care professional if they continue or are bothersome): confusion decreased hearing diarrhea facial flushing hair loss headache loss of appetite missed menstrual periods signs and symptoms of low red blood cells or anemia such as unusually weak or tired; feeling faint or lightheaded; falls skin discoloration This list may not describe all possible side effects. Call your doctor for medical advice about side effects. You may report side effects to FDA at1-800-FDA-1088. Where should I keep my medication? This drug is given in a hospital or clinic and will not be stored at home. NOTE: This sheet is a summary. It may not cover all possible information. If you have questions about this medicine, talk to your doctor, pharmacist, orhealth care provider.  2022 Elsevier/Gold Standard (2019-06-22 09:53:29)

## 2021-04-13 NOTE — Progress Notes (Deleted)
[facility]  [phone] [phone]     ID: Leslie Wright DOB: [birth year]  MR#: [id number]  RCV#:[id number]  Patient Care Team: Fanny Bien, MD as PCP - General (Family Medicine) Herminio Commons, MD (Inactive) as PCP - Cardiology (Cardiology) Mauro Kaufmann, RN as Oncology Nurse Navigator Rockwell Germany, RN as Oncology Nurse Navigator Stark Klein, MD as Consulting Physician (General Surgery) Kyung Rudd, MD as Consulting Physician (Radiation Oncology) Scot Dock, NP OTHER MD:  I connected with Leslie Wright on 04/13/21 at  9:00 AM EDT by my chart video and verified that I am speaking with the correct person using two identifiers.  I discussed the limitations, risks, security and privacy concerns of performing an evaluation and management service by telephone and the availability of in person appointments.  I also discussed with the patient that there may be a patient responsible charge related to this service. The patient expressed understanding and agreed to proceed.  Patient location: [facility] Provider location: home Others participating: Dr. Jana Hakim   CHIEF COMPLAINT: Bilateral breast cancers  CURRENT TREATMENT: Neoadjuvant chemotherapy   INTERVAL HISTORY: Leslie Wright returns today for follow up and treatment of her bilateral breast cancers.   She began neoadjuvant chemotherapy, consisting of pembrolizumab, carboplatin, and paclitaxel, on 02/10/2021. Today is day 1 cycle 4.  She is due to receive treatment with Paclitaxel, Carboplatin and Pembrolizumab today.  She notes she is experiencing some mild peripheral neuropathy in her toes that is intermittent.  She also is experiencing fatigue, and has had difficulty with her normal activities from feeling tired with the chemotherapy.    We are following her TSH noted below.    Results for Leslie, Wright" (MRN [id number]) as of 04/13/2021 09:37  Ref. Range 09/27/2016 13:42 02/17/2021  08:15 02/23/2021 10:57 03/02/2021 09:01 03/23/2021 08:37  TSH Latest Ref Range: 0.308 - 3.960 uIU/mL 1.890 2.733 1.316 1.527 1.823    REVIEW OF SYSTEMS: Review of Systems  Constitutional:  Positive for fatigue. Negative for appetite change, chills, fever and unexpected weight change.  HENT:   Negative for hearing loss, lump/mass and trouble swallowing.   Eyes:  Negative for eye problems and icterus.  Respiratory:  Negative for chest tightness, cough and shortness of breath.   Cardiovascular:  Negative for chest pain, leg swelling and palpitations.  Gastrointestinal:  Negative for abdominal distention, abdominal pain, constipation, diarrhea, nausea and vomiting.  Endocrine: Negative for hot flashes.  Genitourinary:  Negative for difficulty urinating.   Musculoskeletal:  Negative for arthralgias.  Skin:  Negative for itching and rash.  Neurological:  Positive for numbness. Negative for dizziness, extremity weakness and headaches.  Hematological:  Negative for adenopathy. Does not bruise/bleed easily.  Psychiatric/Behavioral:  Negative for depression. The patient is not nervous/anxious.       COVID 19 VACCINATION STATUS: Status post Dent x2, no booster as of 01/24/2021   HISTORY OF CURRENT ILLNESS: From the original intake note:  Leslie Wright" presented with a palpable left breast lump. She underwent bilateral diagnostic mammography with tomography and bilateral breast ultrasonography at [facility] on 01/12/2021 showing: breast density category C; 5.8 cm mass in the left breast at 12:30 with associated calcifications; additional 1.2 cm group of calcifications in the outer left breast;   On the right: Multiple small masses and irregular ducts in the retroareolar right breast, including a 0.8 cm representative mass at 6 o'clock; indeterminate 0.4 cm mass in upper-inner right breast; normal lymph  nodes bilaterally.  Accordingly on 01/16/2021 she proceeded to biopsy of the right  breast areas in question. The pathology from this procedure ([id number]) showed:  1. Right Breast, 6 o'clock  - invasive ductal carcinoma, grade 2. Prognostic indicators significant for: estrogen receptor, 90% positive with strong staining intensity and progesterone receptor, 0% negative. Proliferation marker Ki67 at 5%. HER2 equivocal by immunohistochemistry (2+), but negative by fluorescent in situ hybridization with a signals ratio 1.61 and number per cell 2.5. 2. Right Breast, 9 o'clock  - fibrocystic change with adenosis, calcifications, and a small intraductal papilloma.  She underwent biopsy of the left breast areas on 01/20/2021. Pathology [id number]) revealed: 1. Left Breast, upper-outer quadrant  - invasive ductal carcinoma, grade 3 2. Left Breast, central, slightly lateral to midline  - ductal carcinoma in situ with necrosis and calcifications, intermediate grade Prognostic panels are pending on both samples.  Cancer Staging Malignant neoplasm of lower-outer quadrant of right breast of female, estrogen receptor positive ([place]) Staging form: Breast, AJCC 8th Edition - Clinical stage from 01/20/2021: Stage IA (cT1c, cN0, cM0, G2, ER+, PR-, HER2-) - Signed by Chauncey Cruel, MD on 01/25/2021 Stage prefix: Initial diagnosis Histologic grading system: 3 grade system  Malignant neoplasm of overlapping sites of left breast in female, estrogen receptor negative ([place]) Staging form: Breast, AJCC 8th Edition - Clinical stage from 01/20/2021: Stage IIIB (cT3, cN0, cM0, G3, ER-, PR-, HER2-) - Signed by Chauncey Cruel, MD on 01/25/2021 Histologic grading system: 3 grade system  The patient's subsequent history is as detailed below.   PAST MEDICAL HISTORY: Past Medical History:  Diagnosis Date   Acid reflux    Anemia    Arthritis    "maybe in my fingers" (03/26/2018)   Breast cancer ([place]) 12/2020   both sides   Concussion 2001   no deficit had tia right after no problems since    Family history of adverse reaction to anesthesia    "brother, Timmy, and my mom always get sick" (03/26/2018)   Family history of brain cancer    Family history of breast cancer    Family history of colon cancer    Family history of lung cancer    Family history of pancreatic cancer    Family history of prostate cancer    Headache    High blood pressure    High cholesterol    Hip pain    "left; before OR" (03/26/2018)   History of chemotherapy last done 02-17-2021   History of hiatal hernia    History of kidney stones    OSA (obstructive sleep apnea)    "can't tolerate the mask" (03/26/2018)    PONV (postoperative nausea and vomiting)    does not need much anesthesia, slow to awlaekn in past   Pre-diabetes    Shortness of breath    with exertion   Wears partial dentures upper    PAST SURGICAL HISTORY: Past Surgical History:  Procedure Laterality Date   CARPAL TUNNEL RELEASE Right ~ 2006   JOINT REPLACEMENT     LAPAROSCOPIC CHOLECYSTECTOMY  12/2010   LASIK Bilateral 2000   PORT A CATH REVISION Left 02/21/2021   Procedure: PORT A CATH REVISION;  Surgeon: Stark Klein, MD;  Location: [facility];  Service: General;  Laterality: Left;  60 MINUTES ROOM 5   PORTACATH PLACEMENT N/A 02/08/2021   Procedure: INSERTION PORT-A-CATH;  Surgeon: Stark Klein, MD;  Location: [facility];  Service: General;  Laterality: N/A;   TOTAL  HIP ARTHROPLASTY Left 03/25/2018   Procedure: LEFT TOTAL HIP ARTHROPLASTY ANTERIOR APPROACH;  Surgeon: Mcarthur Rossetti, MD;  Location: [facility];  Service: Orthopedics;  Laterality: Left;    FAMILY HISTORY: Family History  Problem Relation Age of Onset   Diabetes Mother    Thyroid disease Mother    Liver disease Mother    Obesity Mother    Hypertension Father    Hyperlipidemia Father    Heart disease Father    Stroke Father    Obesity Father    Colon polyps Father    Prostate cancer Brother    Brain cancer Maternal Grandfather    Pancreatic  cancer Paternal Grandmother    Colon cancer Neg Hx        No one in family with colon cancer.  The patient's father is 50 years old as of 2021-02-14.  The patient's mother died from cirrhosis associated with hepatitis C (from a transfusion) age 12.  The patient has 3 brothers, no sisters.  1 brother has a history of prostate cancer at age 100.  A maternal grandfather had a history of central nervous system carcinoma.  A paternal grandmother had a history of pancreatic cancer and a maternal aunt had a history of breast cancer in her late 4s.   GYNECOLOGIC HISTORY:  No LMP recorded (lmp unknown). Patient is postmenopausal. Menarche: 55 years old [place] P 0 LMP 53 HRT no  Hysterectomy? no BSO? no   SOCIAL HISTORY: (updated Feb 14, 2021)  Leslie Wright "Leslie Wright" works for [employer] in [employer].  She works from home.  She is a Engineer, mining.  She describes herself as single, lives by herself, with a toy poodle and a [place] as well as 2 cats.  ADVANCED DIRECTIVES: The patient's brother Leslie Wright is her healthcare power of attorney.  He can be reached at [phone]   HEALTH MAINTENANCE: Social History   Tobacco Use   Smoking status: Former    Types: Cigarettes   Smokeless tobacco: Never   Tobacco comments:    In teens social  Vaping Use   Vaping Use: Never used  Substance Use Topics   Alcohol use: Yes    Comment: Occasional   Drug use: Never     Colonoscopy:   PAP: 03/2011  Bone density: scheduled for 07/2021   No Known Allergies  Current Outpatient Medications  Medication Sig Dispense Refill   acetaminophen (TYLENOL) 500 MG tablet Take 1,000 mg by mouth every 6 (six) hours as needed for moderate pain.     amoxicillin-clavulanate (AUGMENTIN) 875-125 MG tablet Take 1 tablet by mouth 2 (two) times daily. 14 tablet 0   aspirin EC 81 MG tablet Take 81 mg by mouth daily.     betamethasone valerate ointment (VALISONE) 0.1 % Apply 1 application topically 2 (two) times daily. 30 g 0    Blood Glucose Monitoring Suppl (FREESTYLE LITE) w/Device KIT CHECK GLUCOSE 2-3 TIMES DAILY 1 kit 0   Cholecalciferol (VITAMIN D) 50 MCG (2000 UT) CAPS Take 2,000 Units by mouth daily.     clindamycin (CLINDAGEL) 1 % gel Apply topically 2 (two) times daily. 30 g 0   dexamethasone (DECADRON) 4 MG tablet Take 2 tablets once a day for 3 days after carboplatin and AC chemotherapy. Take with food. 30 tablet 1   ELDERBERRY PO Take 1 capsule by mouth daily.     folic acid (FOLVITE) 1 MG tablet Take 1 mg by mouth daily.     glucose blood test  strip USE AS DIRECTED TO CHECK BLOOD SUGAR 1 TO 3 TIMES A DAY (Patient taking differently: USE AS DIRECTED TO CHECK BLOOD SUGAR 1 TO 3 TIMES A DAY) 100 strip 3   ketoconazole (NIZORAL) 2 % cream Apply 1 application topically daily. (Patient taking differently: Apply 1 application topically daily as needed.) 15 g 0   Lancets (FREESTYLE) lancets USE AS DIRECTED TO CHECK BLOOD SUGAR 2 TO 3 TIMES A DAY (Patient taking differently: USE AS DIRECTED TO CHECK BLOOD SUGAR 2 TO 3 TIMES A DAY) 100 each 3   lidocaine-prilocaine (EMLA) cream Apply to affected area once 30 g 3   losartan (COZAAR) 100 MG tablet Take 1 tablet by mouth daily 90 tablet 3   methotrexate (RHEUMATREX) 15 MG tablet Take 15 mg by mouth every Wednesday. Caution: Chemotherapy. Protect from light.     Multiple Vitamin (MULTIVITAMIN WITH MINERALS) TABS tablet Take 1 tablet by mouth daily.     omeprazole (PRILOSEC) 20 MG capsule Take 20 mg by mouth daily.     ondansetron (ZOFRAN) 8 MG tablet Take 1 tablet (8 mg total) by mouth 2 (two) times daily as needed. Start on the third day after carboplatin and AC chemotherapy. 30 tablet 1   prochlorperazine (COMPAZINE) 10 MG tablet Take 1 tablet (10 mg total) by mouth every 6 (six) hours as needed (Nausea or vomiting). 30 tablet 1   rosuvastatin (CRESTOR) 10 MG tablet TAKE 1 TABLET BY MOUTH ONCE A DAY AT BEDTIME FOR CHOLESTEROL (Patient taking differently: Take 10 mg by  mouth at bedtime.) 90 tablet 1   No current facility-administered medications for this visit.   Facility-Administered Medications Ordered in Other Visits  Medication Dose Route Frequency Provider Last Rate Last Admin   cyclophosphamide (CYTOXAN) 1,540 mg in sodium chloride 0.9 % 250 mL chemo infusion  600 mg/m2 (Treatment Plan Recorded) Intravenous Once Magrinat, Virgie Dad, MD 654 mL/hr at 04/13/21 1300 1,540 mg at 04/13/21 1300   heparin lock flush 100 unit/mL  500 Units Intracatheter Once PRN Magrinat, Virgie Dad, MD       sodium chloride flush (NS) 0.9 % injection 10 mL  10 mL Intracatheter PRN Magrinat, Virgie Dad, MD        OBJECTIVE:   There were no vitals filed for this visit.     There is no height or weight on file to calculate BMI.   Wt Readings from Last 3 Encounters:  04/13/21 (!) 301 lb 6.4 oz (136.7 kg)  04/06/21 (!) 302 lb 14.4 oz (137.4 kg)  03/23/21 (!) 305 lb 6.4 oz (138.5 kg)   Patient sounds well, in no apparent distress.  Mood and behavior are normal.  Breathing is non labored.      LAB RESULTS:  CMP     Component Value Date/Time   NA 140 04/13/2021 0851   NA 143 12/02/2018 1138   K 3.9 04/13/2021 0851   CL 104 04/13/2021 0851   CO2 25 04/13/2021 0851   GLUCOSE 124 (H) 04/13/2021 0851   BUN 14 04/13/2021 0851   BUN 17 12/02/2018 1138   CREATININE 1.18 (H) 04/13/2021 0851   CREATININE 1.31 (H) 03/16/2021 0850   CREATININE 1.09 (H) 02/18/2019 1524   CALCIUM 10.1 04/13/2021 0851   PROT 6.7 04/13/2021 0851   PROT 6.9 12/02/2018 1138   ALBUMIN 3.3 (L) 04/13/2021 0851   ALBUMIN 4.0 12/02/2018 1138   AST 14 (L) 04/13/2021 0851   AST 19 03/16/2021 0850   ALT 25 04/13/2021 0851  ALT 28 03/16/2021 0850   ALKPHOS 78 04/13/2021 0851   BILITOT 0.4 04/13/2021 0851   BILITOT <0.1 (L) 03/16/2021 0850   GFRNONAA 54 (L) 04/13/2021 0851   GFRNONAA 48 (L) 03/16/2021 0850   GFRAA 70 12/02/2018 1138    No results found for: TOTALPROTELP, ALBUMINELP, A1GS, A2GS,  BETS, BETA2SER, GAMS, MSPIKE, SPEI  Lab Results  Component Value Date   WBC 9.8 04/13/2021   NEUTROABS 6.2 04/13/2021   HGB 11.5 (L) 04/13/2021   HCT 33.3 (L) 04/13/2021   MCV 89.5 04/13/2021   PLT 107 (L) 04/13/2021    No results found for: LABCA2  No components found for: GOTLXB262  No results for input(s): INR in the last 168 hours.  No results found for: LABCA2  No results found for: MBT597  No results found for: CBU384  No results found for: TXM468  No results found for: CA2729  No components found for: HGQUANT  No results found for: CEA1 / No results found for: CEA1   No results found for: AFPTUMOR  No results found for: CHROMOGRNA  No results found for: KPAFRELGTCHN, LAMBDASER, KAPLAMBRATIO (kappa/lambda light chains)  No results found for: HGBA, HGBA2QUANT, HGBFQUANT, HGBSQUAN (Hemoglobinopathy evaluation)   No results found for: LDH  No results found for: IRON, TIBC, IRONPCTSAT (Iron and TIBC)  No results found for: FERRITIN  Urinalysis    Component Value Date/Time   COLORURINE YELLOW 10/23/2010 2031   APPEARANCEUR CLEAR 10/23/2010 2031   LABSPEC 1.025 10/23/2010 2031   PHURINE 6.0 10/23/2010 2031   GLUCOSEU NEGATIVE 03/12/2009 0843   HGBUR MODERATE (A) 10/23/2010 2031   BILIRUBINUR NEGATIVE 10/23/2010 2031   KETONESUR NEGATIVE 10/23/2010 2031   PROTEINUR TRACE (A) 10/23/2010 2031   UROBILINOGEN 0.2 10/23/2010 2031   NITRITE NEGATIVE 10/23/2010 2031   LEUKOCYTESUR NEGATIVE 10/23/2010 2031    STUDIES: No results found.    ELIGIBLE FOR AVAILABLE RESEARCH PROTOCOL:   ASSESSMENT: 55 y.o. [place] woman presenting with bilateral breast cancers April 2022 as follows:  (1) in the right breast, a clinical T2 N0-1, stage IIA-IIB invasive ductal carcinoma, grade 2, estrogen receptor positive, progesterone receptor and HER2 negative, with an MIB-1 of 5%;  (a)  a second, retroareolar mass was benign but discordant; biopsy 02/07/2021  (b)  borderline right axillary node not amenable to biopsy  (c) extensive patchy enhancement noted throughout the inferior right breast  (2) in the left breast, a clinical T3 N0, stage IIIB invasive ductal carcinoma, grade 3, functionally triple negative, with an MIB-1 of 25% (HER2 results are pending).  (a) secondary biopsy shows ductal carcinoma in situ associated with extensive patchy enhancement  (3) staging studies:  (a) chest CT scan 02/06/2021 shows hepatic steatosis and degenerative disc disease, no evidence of metastatic disease  (b) bone scan 02/06/2021 shows no evidence of metastatic disease  (4) neoadjuvant chemotherapy will start 02/09/2021 consisting of pembrolizumab/carboplatin/paclitaxel stopped one cycle early due to peripheral neuropathy followed by pembrolizumab/doxorubicin/cyclophosphamide  (5) definitive surgery to follow  (6) adjuvant radiation to bilateral sites  (7) Negative genetic testing on 02/13/2021. VUS in ATM called c.1132A>G identified.  Genes tested include: AIP, ALK, APC, ATM, AXIN2,BAP1,  BARD1, BLM, BMPR1A, BRCA1, BRCA2, BRIP1, CASR, CDC73, CDH1, CDK4, CDKN1B, CDKN1C, CDKN2A (p14ARF), CDKN2A (p16INK4a), CEBPA, CHEK2, CTNNA1, DICER1, DIS3L2, EGFR (c.2369C>T, p.Thr790Met variant only), EPCAM (Deletion/duplication testing only), FH, FLCN, GATA2, GPC3, GREM1 (Promoter region deletion/duplication testing only), HOXB13 (c.251G>A, p.Gly84Glu), HRAS, KIT, MAX, MEN1, MET, MITF (c.952G>A, p.Glu318Lys variant only), MLH1, MSH2, MSH3, MSH6,  MUTYH, NBN, NF1, NF2, NTHL1, PALB2, PDGFRA, PHOX2B, PMS2, POLD1, POLE, POT1, PRKAR1A, PTCH1, PTEN, RAD50, RAD51C, RAD51D, RB1, RECQL4, RET, RUNX1, SDHAF2, SDHA (sequence changes only), SDHB, SDHC, SDHD, SMAD4, SMARCA4, SMARCB1, SMARCE1, STK11, SUFU, TERC, TERT, TMEM127, TP53, TSC1, TSC2, VHL, WRN and WT1.  PLAN: Leslie Wright is doing moderately well. Her labs are stable.  She has continued on neoadjuvant chemotherapy with Paclitaxel, Carboplatin, and  Pembrolizumab completed 3 out of 4 of the cycles.  Leslie Wright has developed peripheral neuropathy in the pads of her toes, and due to this side effect, she will move onto receive Doxorubicin, Cyclophosphamide, and Pembrolizumab today.    Dr. Jana Hakim joined the visit and reviewed this change with Leslie Wright and adjusted the orders.  I encouraged her to stay active, and small amounts of exercise can reduce her fatigue level.    I reviewed with Leslie Wright how to take her anti nausea medications.  She will return in 2 days for her growth factor injection, and then we will see her in 1 week for labs and f/u, and in 3 weeks for her next treatment.    She knows to call for any questions that may arise between now and her next appointment.  We are happy to see her sooner if needed.     Wilber Bihari, NP 04/13/21 1:04 PM Medical Oncology and Hematology [facility] [place], [place] [address] Tel. [phone]    Fax. [phone]    *Total Encounter Time as defined by the Centers for Medicare and Medicaid Services includes, in addition to the face-to-face time of a patient visit (documented in the note above) non-face-to-face time: obtaining and reviewing outside history, ordering and reviewing medications, tests or procedures, care coordination (communications with other health care professionals or caregivers) and documentation in the medical record.

## 2021-04-15 ENCOUNTER — Other Ambulatory Visit: Payer: Self-pay

## 2021-04-15 ENCOUNTER — Inpatient Hospital Stay: Payer: 59

## 2021-04-15 VITALS — BP 133/85 | HR 81 | Temp 98.1°F | Resp 20

## 2021-04-15 DIAGNOSIS — Z79899 Other long term (current) drug therapy: Secondary | ICD-10-CM | POA: Diagnosis not present

## 2021-04-15 DIAGNOSIS — C50412 Malignant neoplasm of upper-outer quadrant of left female breast: Secondary | ICD-10-CM | POA: Diagnosis not present

## 2021-04-15 DIAGNOSIS — C50511 Malignant neoplasm of lower-outer quadrant of right female breast: Secondary | ICD-10-CM

## 2021-04-15 DIAGNOSIS — Z5189 Encounter for other specified aftercare: Secondary | ICD-10-CM | POA: Diagnosis not present

## 2021-04-15 DIAGNOSIS — Z5111 Encounter for antineoplastic chemotherapy: Secondary | ICD-10-CM | POA: Diagnosis not present

## 2021-04-15 DIAGNOSIS — D701 Agranulocytosis secondary to cancer chemotherapy: Secondary | ICD-10-CM | POA: Diagnosis not present

## 2021-04-15 DIAGNOSIS — R6889 Other general symptoms and signs: Secondary | ICD-10-CM | POA: Diagnosis not present

## 2021-04-15 DIAGNOSIS — C50211 Malignant neoplasm of upper-inner quadrant of right female breast: Secondary | ICD-10-CM | POA: Diagnosis not present

## 2021-04-15 DIAGNOSIS — C50812 Malignant neoplasm of overlapping sites of left female breast: Secondary | ICD-10-CM

## 2021-04-15 DIAGNOSIS — Z5112 Encounter for antineoplastic immunotherapy: Secondary | ICD-10-CM | POA: Diagnosis not present

## 2021-04-15 DIAGNOSIS — Z171 Estrogen receptor negative status [ER-]: Secondary | ICD-10-CM

## 2021-04-15 DIAGNOSIS — Z170 Estrogen receptor positive status [ER+]: Secondary | ICD-10-CM | POA: Diagnosis not present

## 2021-04-15 MED ORDER — PEGFILGRASTIM-BMEZ 6 MG/0.6ML ~~LOC~~ SOSY
6.0000 mg | PREFILLED_SYRINGE | Freq: Once | SUBCUTANEOUS | Status: AC
  Administered 2021-04-15: 6 mg via SUBCUTANEOUS

## 2021-04-15 NOTE — Patient Instructions (Signed)

## 2021-04-20 ENCOUNTER — Other Ambulatory Visit: Payer: Self-pay | Admitting: *Deleted

## 2021-04-20 ENCOUNTER — Telehealth: Payer: Self-pay | Admitting: *Deleted

## 2021-04-20 ENCOUNTER — Inpatient Hospital Stay (HOSPITAL_COMMUNITY)
Admission: EM | Admit: 2021-04-20 | Discharge: 2021-04-25 | DRG: 808 | Disposition: A | Discharge status: Home / Self Care | Payer: 59 | Source: Ambulatory Visit | Attending: Internal Medicine | Admitting: Internal Medicine

## 2021-04-20 ENCOUNTER — Encounter: Payer: Self-pay | Admitting: Adult Health

## 2021-04-20 ENCOUNTER — Inpatient Hospital Stay: Payer: 59

## 2021-04-20 ENCOUNTER — Inpatient Hospital Stay (HOSPITAL_BASED_OUTPATIENT_CLINIC_OR_DEPARTMENT_OTHER): Payer: 59 | Admitting: Adult Health

## 2021-04-20 ENCOUNTER — Ambulatory Visit (HOSPITAL_COMMUNITY)
Admission: RE | Admit: 2021-04-20 | Discharge: 2021-04-20 | Disposition: A | Discharge status: Home / Self Care | Payer: 59 | Source: Ambulatory Visit | Attending: Adult Health | Admitting: Adult Health

## 2021-04-20 ENCOUNTER — Other Ambulatory Visit: Payer: Self-pay

## 2021-04-20 VITALS — HR 110 | Temp 99.8°F | Resp 20

## 2021-04-20 VITALS — BP 115/66 | HR 118 | Temp 97.9°F | Resp 20 | Ht 67.0 in | Wt 298.8 lb

## 2021-04-20 DIAGNOSIS — C50812 Malignant neoplasm of overlapping sites of left female breast: Secondary | ICD-10-CM

## 2021-04-20 DIAGNOSIS — Z8249 Family history of ischemic heart disease and other diseases of the circulatory system: Secondary | ICD-10-CM

## 2021-04-20 DIAGNOSIS — R0602 Shortness of breath: Secondary | ICD-10-CM

## 2021-04-20 DIAGNOSIS — Z79899 Other long term (current) drug therapy: Secondary | ICD-10-CM

## 2021-04-20 DIAGNOSIS — I10 Essential (primary) hypertension: Secondary | ICD-10-CM | POA: Diagnosis present

## 2021-04-20 DIAGNOSIS — G4733 Obstructive sleep apnea (adult) (pediatric): Secondary | ICD-10-CM | POA: Diagnosis present

## 2021-04-20 DIAGNOSIS — Z7982 Long term (current) use of aspirin: Secondary | ICD-10-CM

## 2021-04-20 DIAGNOSIS — E876 Hypokalemia: Secondary | ICD-10-CM | POA: Diagnosis not present

## 2021-04-20 DIAGNOSIS — Z95828 Presence of other vascular implants and grafts: Secondary | ICD-10-CM

## 2021-04-20 DIAGNOSIS — R509 Fever, unspecified: Secondary | ICD-10-CM | POA: Diagnosis not present

## 2021-04-20 DIAGNOSIS — I129 Hypertensive chronic kidney disease with stage 1 through stage 4 chronic kidney disease, or unspecified chronic kidney disease: Secondary | ICD-10-CM | POA: Diagnosis present

## 2021-04-20 DIAGNOSIS — Z170 Estrogen receptor positive status [ER+]: Secondary | ICD-10-CM

## 2021-04-20 DIAGNOSIS — Z87891 Personal history of nicotine dependence: Secondary | ICD-10-CM

## 2021-04-20 DIAGNOSIS — Z6841 Body Mass Index (BMI) 40.0 and over, adult: Secondary | ICD-10-CM | POA: Diagnosis not present

## 2021-04-20 DIAGNOSIS — Z171 Estrogen receptor negative status [ER-]: Secondary | ICD-10-CM

## 2021-04-20 DIAGNOSIS — E1122 Type 2 diabetes mellitus with diabetic chronic kidney disease: Secondary | ICD-10-CM | POA: Diagnosis present

## 2021-04-20 DIAGNOSIS — T451X5A Adverse effect of antineoplastic and immunosuppressive drugs, initial encounter: Secondary | ICD-10-CM

## 2021-04-20 DIAGNOSIS — D709 Neutropenia, unspecified: Secondary | ICD-10-CM | POA: Diagnosis present

## 2021-04-20 DIAGNOSIS — C50919 Malignant neoplasm of unspecified site of unspecified female breast: Secondary | ICD-10-CM | POA: Diagnosis not present

## 2021-04-20 DIAGNOSIS — C50511 Malignant neoplasm of lower-outer quadrant of right female breast: Secondary | ICD-10-CM

## 2021-04-20 DIAGNOSIS — D61810 Antineoplastic chemotherapy induced pancytopenia: Secondary | ICD-10-CM

## 2021-04-20 DIAGNOSIS — D701 Agranulocytosis secondary to cancer chemotherapy: Secondary | ICD-10-CM | POA: Diagnosis not present

## 2021-04-20 DIAGNOSIS — N179 Acute kidney failure, unspecified: Secondary | ICD-10-CM | POA: Diagnosis not present

## 2021-04-20 DIAGNOSIS — Z833 Family history of diabetes mellitus: Secondary | ICD-10-CM

## 2021-04-20 DIAGNOSIS — R6883 Chills (without fever): Secondary | ICD-10-CM

## 2021-04-20 DIAGNOSIS — K760 Fatty (change of) liver, not elsewhere classified: Secondary | ICD-10-CM | POA: Diagnosis present

## 2021-04-20 DIAGNOSIS — E782 Mixed hyperlipidemia: Secondary | ICD-10-CM | POA: Diagnosis present

## 2021-04-20 DIAGNOSIS — R5081 Fever presenting with conditions classified elsewhere: Secondary | ICD-10-CM | POA: Diagnosis not present

## 2021-04-20 DIAGNOSIS — J9601 Acute respiratory failure with hypoxia: Secondary | ICD-10-CM | POA: Diagnosis present

## 2021-04-20 DIAGNOSIS — Z83438 Family history of other disorder of lipoprotein metabolism and other lipidemia: Secondary | ICD-10-CM

## 2021-04-20 DIAGNOSIS — Z803 Family history of malignant neoplasm of breast: Secondary | ICD-10-CM

## 2021-04-20 DIAGNOSIS — N1831 Chronic kidney disease, stage 3a: Secondary | ICD-10-CM | POA: Diagnosis not present

## 2021-04-20 DIAGNOSIS — R7303 Prediabetes: Secondary | ICD-10-CM | POA: Diagnosis present

## 2021-04-20 DIAGNOSIS — R059 Cough, unspecified: Secondary | ICD-10-CM | POA: Diagnosis not present

## 2021-04-20 DIAGNOSIS — N183 Chronic kidney disease, stage 3 unspecified: Secondary | ICD-10-CM | POA: Diagnosis present

## 2021-04-20 DIAGNOSIS — R197 Diarrhea, unspecified: Secondary | ICD-10-CM | POA: Diagnosis present

## 2021-04-20 DIAGNOSIS — Z20822 Contact with and (suspected) exposure to covid-19: Secondary | ICD-10-CM | POA: Diagnosis not present

## 2021-04-20 DIAGNOSIS — K219 Gastro-esophageal reflux disease without esophagitis: Secondary | ICD-10-CM | POA: Diagnosis present

## 2021-04-20 DIAGNOSIS — Z87442 Personal history of urinary calculi: Secondary | ICD-10-CM

## 2021-04-20 DIAGNOSIS — M1990 Unspecified osteoarthritis, unspecified site: Secondary | ICD-10-CM | POA: Diagnosis present

## 2021-04-20 LAB — CMP (CANCER CENTER ONLY)
ALT: 22 U/L (ref 0–44)
AST: 14 U/L — ABNORMAL LOW (ref 15–41)
Albumin: 3.4 g/dL — ABNORMAL LOW (ref 3.5–5.0)
Alkaline Phosphatase: 57 U/L (ref 38–126)
Anion gap: 10 (ref 5–15)
BUN: 18 mg/dL (ref 6–20)
CO2: 23 mmol/L (ref 22–32)
Calcium: 8.3 mg/dL — ABNORMAL LOW (ref 8.9–10.3)
Chloride: 99 mmol/L (ref 98–111)
Creatinine: 1.2 mg/dL — ABNORMAL HIGH (ref 0.44–1.00)
GFR, Estimated: 53 mL/min — ABNORMAL LOW (ref >60)
Glucose, Bld: 112 mg/dL — ABNORMAL HIGH (ref 70–99)
Potassium: 3.6 mmol/L (ref 3.5–5.1)
Sodium: 132 mmol/L — ABNORMAL LOW (ref 135–145)
Total Bilirubin: 1.2 mg/dL (ref 0.3–1.2)
Total Protein: 6.4 g/dL — ABNORMAL LOW (ref 6.5–8.1)

## 2021-04-20 LAB — CBC WITH DIFFERENTIAL/PLATELET
Abs Immature Granulocytes: 0.01 10*3/uL (ref 0.00–0.07)
Basophils Absolute: 0 10*3/uL (ref 0.0–0.1)
Basophils Relative: 0 %
Eosinophils Absolute: 0 10*3/uL (ref 0.0–0.5)
Eosinophils Relative: 0 %
HCT: 25.9 % — ABNORMAL LOW (ref 36.0–46.0)
Hemoglobin: 8.9 g/dL — ABNORMAL LOW (ref 12.0–15.0)
Immature Granulocytes: 4 %
Lymphocytes Relative: 83 %
Lymphs Abs: 0.2 10*3/uL — ABNORMAL LOW (ref 0.7–4.0)
MCH: 31.1 pg (ref 26.0–34.0)
MCHC: 34.4 g/dL (ref 30.0–36.0)
MCV: 90.6 fL (ref 80.0–100.0)
Monocytes Absolute: 0 10*3/uL — ABNORMAL LOW (ref 0.1–1.0)
Monocytes Relative: 9 %
Neutro Abs: 0 10*3/uL — CL (ref 1.7–7.7)
Neutrophils Relative %: 4 %
Platelets: 58 10*3/uL — ABNORMAL LOW (ref 150–400)
RBC: 2.86 MIL/uL — ABNORMAL LOW (ref 3.87–5.11)
RDW: 19.3 % — ABNORMAL HIGH (ref 11.5–15.5)
WBC: 0.2 10*3/uL — CL (ref 4.0–10.5)
nRBC: 0 % (ref 0.0–0.2)

## 2021-04-20 LAB — RESP PANEL BY RT-PCR (FLU A&B, COVID) ARPGX2
Influenza A by PCR: NEGATIVE
Influenza B by PCR: NEGATIVE
SARS Coronavirus 2 by RT PCR: NEGATIVE

## 2021-04-20 LAB — URINALYSIS, COMPLETE (UACMP) WITH MICROSCOPIC
Bilirubin Urine: NEGATIVE
Glucose, UA: NEGATIVE mg/dL
Ketones, ur: NEGATIVE mg/dL
Leukocytes,Ua: NEGATIVE
Nitrite: NEGATIVE
Protein, ur: NEGATIVE mg/dL
Specific Gravity, Urine: 1.015 (ref 1.005–1.030)
pH: 5 (ref 5.0–8.0)

## 2021-04-20 LAB — GLUCOSE, CAPILLARY: Glucose-Capillary: 101 mg/dL — ABNORMAL HIGH (ref 70–99)

## 2021-04-20 LAB — HEMOGLOBIN A1C
Hgb A1c MFr Bld: 6.7 % — ABNORMAL HIGH (ref 4.8–5.6)
Mean Plasma Glucose: 145.59 mg/dL

## 2021-04-20 MED ORDER — SODIUM CHLORIDE 0.9 % IV SOLN
2.0000 g | Freq: Once | INTRAVENOUS | Status: AC
  Administered 2021-04-20: 2 g via INTRAVENOUS
  Filled 2021-04-20: qty 2

## 2021-04-20 MED ORDER — ONDANSETRON HCL 4 MG/2ML IJ SOLN: 4.0000 mg | Freq: Four times a day (QID) | INTRAMUSCULAR | Status: DC | PRN

## 2021-04-20 MED ORDER — HYDROCODONE-ACETAMINOPHEN 5-325 MG PO TABS: 1.0000 | ORAL_TABLET | ORAL | Status: DC | PRN

## 2021-04-20 MED ORDER — ADULT MULTIVITAMIN W/MINERALS CH
1.0000 | ORAL_TABLET | Freq: Every day | ORAL | Status: DC
  Administered 2021-04-20 – 2021-04-25 (×6): 1 via ORAL
  Filled 2021-04-20 (×6): qty 1

## 2021-04-20 MED ORDER — VITAMIN D 25 MCG (1000 UNIT) PO TABS
2000.0000 [IU] | ORAL_TABLET | Freq: Every day | ORAL | Status: DC
  Administered 2021-04-20 – 2021-04-25 (×6): 2000 [IU] via ORAL
  Filled 2021-04-20 (×6): qty 2

## 2021-04-20 MED ORDER — BISACODYL 5 MG PO TBEC: 5.0000 mg | DELAYED_RELEASE_TABLET | Freq: Every day | ORAL | Status: DC | PRN

## 2021-04-20 MED ORDER — VANCOMYCIN HCL IN DEXTROSE 1-5 GM/200ML-% IV SOLN: 1000.0000 mg | Freq: Once | INTRAVENOUS | Status: DC

## 2021-04-20 MED ORDER — SODIUM CHLORIDE 0.9 % IV SOLN: INTRAVENOUS | Status: DC

## 2021-04-20 MED ORDER — SENNOSIDES-DOCUSATE SODIUM 8.6-50 MG PO TABS: 1.0000 | ORAL_TABLET | Freq: Every evening | ORAL | Status: DC | PRN

## 2021-04-20 MED ORDER — SODIUM CHLORIDE 0.9 % IV SOLN
Freq: Once | INTRAVENOUS | Status: AC
  Filled 2021-04-20: qty 250

## 2021-04-20 MED ORDER — LOSARTAN POTASSIUM 50 MG PO TABS
100.0000 mg | ORAL_TABLET | Freq: Every day | ORAL | Status: DC
  Administered 2021-04-20 – 2021-04-25 (×6): 100 mg via ORAL
  Filled 2021-04-20 (×6): qty 2

## 2021-04-20 MED ORDER — ROSUVASTATIN CALCIUM 10 MG PO TABS
10.0000 mg | ORAL_TABLET | Freq: Every day | ORAL | Status: DC
  Administered 2021-04-20 – 2021-04-24 (×5): 10 mg via ORAL
  Filled 2021-04-20 (×5): qty 1

## 2021-04-20 MED ORDER — TRAZODONE HCL 50 MG PO TABS
25.0000 mg | ORAL_TABLET | Freq: Every evening | ORAL | Status: DC | PRN
  Administered 2021-04-21: 25 mg via ORAL
  Filled 2021-04-20: qty 1

## 2021-04-20 MED ORDER — ACETAMINOPHEN 325 MG PO TABS
650.0000 mg | ORAL_TABLET | Freq: Once | ORAL | Status: AC
  Administered 2021-04-20: 650 mg via ORAL
  Filled 2021-04-20: qty 2

## 2021-04-20 MED ORDER — FOLIC ACID 1 MG PO TABS
1.0000 mg | ORAL_TABLET | Freq: Every day | ORAL | Status: DC
  Administered 2021-04-20 – 2021-04-25 (×6): 1 mg via ORAL
  Filled 2021-04-20 (×6): qty 1

## 2021-04-20 MED ORDER — ASPIRIN EC 81 MG PO TBEC
81.0000 mg | DELAYED_RELEASE_TABLET | Freq: Every day | ORAL | Status: DC
  Administered 2021-04-20 – 2021-04-25 (×6): 81 mg via ORAL
  Filled 2021-04-20 (×6): qty 1

## 2021-04-20 MED ORDER — MORPHINE SULFATE (PF) 2 MG/ML IV SOLN
1.0000 mg | Freq: Four times a day (QID) | INTRAVENOUS | Status: DC | PRN
Start: 2021-04-20 — End: 2021-04-25

## 2021-04-20 MED ORDER — DEXTROSE 5 % IV SOLN
2.0000 g | Freq: Once | INTRAVENOUS | Status: AC
  Administered 2021-04-20: 2 g via INTRAVENOUS
  Filled 2021-04-20: qty 20

## 2021-04-20 MED ORDER — INSULIN ASPART 100 UNIT/ML IJ SOLN
0.0000 [IU] | Freq: Three times a day (TID) | INTRAMUSCULAR | Status: DC
  Administered 2021-04-21 – 2021-04-22 (×2): 2 [IU] via SUBCUTANEOUS
  Filled 2021-04-20: qty 0.15

## 2021-04-20 MED ORDER — ACETAMINOPHEN 500 MG PO TABS: 1000.0000 mg | ORAL_TABLET | Freq: Four times a day (QID) | ORAL | Status: DC | PRN

## 2021-04-20 MED ORDER — ONDANSETRON HCL 4 MG PO TABS: 4.0000 mg | ORAL_TABLET | Freq: Four times a day (QID) | ORAL | Status: DC | PRN

## 2021-04-20 MED ORDER — PANTOPRAZOLE SODIUM 40 MG PO TBEC
40.0000 mg | DELAYED_RELEASE_TABLET | Freq: Every day | ORAL | Status: DC
  Administered 2021-04-20 – 2021-04-25 (×6): 40 mg via ORAL
  Filled 2021-04-20 (×6): qty 1

## 2021-04-20 MED ORDER — ALBUTEROL SULFATE (2.5 MG/3ML) 0.083% IN NEBU
2.5000 mg | INHALATION_SOLUTION | Freq: Four times a day (QID) | RESPIRATORY_TRACT | Status: DC | PRN
Start: 2021-04-20 — End: 2021-04-25
  Administered 2021-04-23 – 2021-04-24 (×2): 2.5 mg via RESPIRATORY_TRACT
  Filled 2021-04-20: qty 3

## 2021-04-20 MED ORDER — IPRATROPIUM BROMIDE 0.02 % IN SOLN: 0.5000 mg | Freq: Four times a day (QID) | RESPIRATORY_TRACT | Status: DC | PRN

## 2021-04-20 MED ORDER — INSULIN ASPART 100 UNIT/ML IJ SOLN
0.0000 [IU] | Freq: Every day | INTRAMUSCULAR | Status: DC
  Filled 2021-04-20: qty 0.05

## 2021-04-20 MED ORDER — SODIUM CHLORIDE 0.9% FLUSH
10.0000 mL | Freq: Once | INTRAVENOUS | Status: AC
  Administered 2021-04-20: 10 mL
  Filled 2021-04-20: qty 10

## 2021-04-20 MED ORDER — SODIUM CHLORIDE 0.9 % IV BOLUS
1000.0000 mL | Freq: Once | INTRAVENOUS | Status: AC
  Administered 2021-04-20: 1000 mL via INTRAVENOUS

## 2021-04-20 MED ORDER — VANCOMYCIN HCL 2000 MG/400ML IV SOLN
2000.0000 mg | Freq: Once | INTRAVENOUS | Status: AC
  Administered 2021-04-20: 2000 mg via INTRAVENOUS
  Filled 2021-04-20: qty 400

## 2021-04-20 MED ORDER — SODIUM CHLORIDE 0.9 % IV SOLN
2.0000 g | Freq: Three times a day (TID) | INTRAVENOUS | Status: DC
  Administered 2021-04-21 – 2021-04-24 (×11): 2 g via INTRAVENOUS
  Filled 2021-04-20 (×11): qty 2

## 2021-04-20 NOTE — H&P (Addendum)
History and Physical   [facility] [place] Admission History and Physical [employer], D.O.    Patient Name: Leslie Wright MR#: [id number] Date of Birth: [birth year] Date of Admission: 04/20/2021  Referring MD/NP/PA: Dr. Darl Householder Primary Care Physician: Fanny Bien, MD  Chief Complaint:  Chief Complaint  Patient presents with   Generalized Body Aches    HPI: Leslie Wright is a 55 y.o. female with a known history of breast cancer currently on chemotherapy, hypertension, hyperlipidemia,  CKD, GERD, impaired glucose tolerance presents to the emergency department for evaluation of fevers and chills.  Patient was in a usual state of health until 1 day prior to admission when she developed chills, had a low-grade temperature at the oncologist office today.  Blood work there revealed an absolute neutrophil count of 0.  She received Rocephin in the office and was sent to the emergency department for admission.  She completed her last chemotherapy 1 week ago and received pegfilgrastim on 7/16. Reports only diarrhea  Patient denies weakness, dizziness, chest pain, shortness of breath, N/V/C, abdominal pain, dysuria/frequency, changes in mental status.    Otherwise there has been no change in status. Patient has been taking medication as prescribed and there has been no recent change in medication or diet.  No recent antibiotics.  There has been no recent illness, hospitalizations, travel or sick contacts.    EMS/ED Course: Patient received cefepime, Vanco, Tylenol. Medical admission has been requested for further management of neutropenic fever.  Review of Systems:  CONSTITUTIONAL: Positive fever/chills, negative fatigue, weakness, weight gain/loss, headache. EYES: No blurry or double vision. ENT: No tinnitus, postnasal drip, redness or soreness of the oropharynx. RESPIRATORY: No cough, dyspnea, wheeze.  No hemoptysis.  CARDIOVASCULAR: No chest pain,  palpitations, syncope, orthopnea. No lower extremity edema.  GASTROINTESTINAL: Positive mucusy diarrheaNo nausea, vomiting, abdominal pain, diarrhea, constipation.  No hematemesis, melena or hematochezia. GENITOURINARY: No dysuria, frequency, hematuria. ENDOCRINE: No polyuria or nocturia. No heat or cold intolerance. HEMATOLOGY: No anemia, bruising, bleeding. INTEGUMENTARY: No rashes, ulcers, lesions. MUSCULOSKELETAL: No arthritis, gout, dyspnea. NEUROLOGIC: No numbness, tingling, ataxia, seizure-type activity, weakness. PSYCHIATRIC: No anxiety, depression, insomnia.   Past Medical History:  Diagnosis Date   Acid reflux    Anemia    Arthritis    "maybe in my fingers" (03/26/2018)   Breast cancer ([place]) 12/2020   both sides   Concussion 2001   no deficit had tia right after no problems since   Family history of adverse reaction to anesthesia    "brother, Otila Kluver, and my mom always get sick" (03/26/2018)   Family history of brain cancer    Family history of breast cancer    Family history of colon cancer    Family history of lung cancer    Family history of pancreatic cancer    Family history of prostate cancer    Headache    High blood pressure    High cholesterol    Hip pain    "left; before OR" (03/26/2018)   History of chemotherapy last done 02-17-2021   History of hiatal hernia    History of kidney stones    OSA (obstructive sleep apnea)    "can't tolerate the mask" (03/26/2018)    PONV (postoperative nausea and vomiting)    does not need much anesthesia, slow to awlaekn in past   Pre-diabetes    Shortness of breath    with exertion   Wears partial dentures upper  Past Surgical History:  Procedure Laterality Date   CARPAL TUNNEL RELEASE Right ~ 2006   JOINT REPLACEMENT     LAPAROSCOPIC CHOLECYSTECTOMY  12/2010   LASIK Bilateral 2000   PORT A CATH REVISION Left 02/21/2021   Procedure: PORT A CATH REVISION;  Surgeon: Stark Klein, MD;  Location: [facility];  Service: General;  Laterality: Left;  60 MINUTES ROOM 5   PORTACATH PLACEMENT N/A 02/08/2021   Procedure: INSERTION PORT-A-CATH;  Surgeon: Stark Klein, MD;  Location: [facility];  Service: General;  Laterality: N/A;   TOTAL HIP ARTHROPLASTY Left 03/25/2018   Procedure: LEFT TOTAL HIP ARTHROPLASTY ANTERIOR APPROACH;  Surgeon: Mcarthur Rossetti, MD;  Location: [facility];  Service: Orthopedics;  Laterality: Left;     reports that she has quit smoking. Her smoking use included cigarettes. She has never used smokeless tobacco. She reports current alcohol use. She reports that she does not use drugs.  No Known Allergies  Family History  Problem Relation Age of Onset   Diabetes Mother    Thyroid disease Mother    Liver disease Mother    Obesity Mother    Hypertension Father    Hyperlipidemia Father    Heart disease Father    Stroke Father    Obesity Father    Colon polyps Father    Prostate cancer Brother    Brain cancer Maternal Grandfather    Pancreatic cancer Paternal Grandmother    Colon cancer Neg Hx        No one in family with colon cancer.    Prior to Admission medications   Medication Sig Start Date End Date Taking? Authorizing Provider  acetaminophen (TYLENOL) 500 MG tablet Take 1,000 mg by mouth every 6 (six) hours as needed for moderate pain.    [provider]  aspirin EC 81 MG tablet Take 81 mg by mouth daily.    [provider]  betamethasone valerate ointment (VALISONE) 0.1 % Apply 1 application topically 2 (two) times daily. 03/02/21   Nicholas Lose, MD  Blood Glucose Monitoring Suppl (FREESTYLE LITE) w/Device KIT CHECK GLUCOSE 2-3 TIMES DAILY 12/02/20 12/02/21  Luanne Bras, FNP  Cholecalciferol (VITAMIN D) 50 MCG (2000 UT) CAPS Take 2,000 Units by mouth daily.    [provider]  clindamycin (CLINDAGEL) 1 % gel Apply topically 2 (two) times daily. 03/16/21   Causey, Charlestine Massed, NP  dexamethasone (DECADRON) 4 MG tablet Take 2 tablets  once a day for 3 days after carboplatin and AC chemotherapy. Take with food. 02/08/21   Magrinat, Virgie Dad, MD  ELDERBERRY PO Take 1 capsule by mouth daily.    [provider]  folic acid (FOLVITE) 1 MG tablet Take 1 mg by mouth daily.    [provider]  glucose blood test strip USE AS DIRECTED TO CHECK BLOOD SUGAR 1 TO 3 TIMES A DAY Patient taking differently: USE AS DIRECTED TO CHECK BLOOD SUGAR 1 TO 3 TIMES A DAY 12/02/20 12/02/21  Luanne Bras, FNP  ketoconazole (NIZORAL) 2 % cream Apply 1 application topically daily. Patient taking differently: Apply 1 application topically daily as needed. 01/24/21   Magrinat, Virgie Dad, MD  Lancets (FREESTYLE) lancets USE AS DIRECTED TO CHECK BLOOD SUGAR 2 TO 3 TIMES A DAY Patient taking differently: USE AS DIRECTED TO CHECK BLOOD SUGAR 2 TO 3 TIMES A DAY 12/02/20 12/02/21  Luanne Bras, FNP  lidocaine-prilocaine (EMLA) cream Apply to affected area once 02/08/21   Magrinat, Virgie Dad, MD  losartan (COZAAR) 100 MG tablet Take 1 tablet by mouth daily 02/03/21     methotrexate (RHEUMATREX) 15 MG tablet Take 15 mg by mouth every Wednesday. Caution: Chemotherapy. Protect from light.    [provider]  Multiple Vitamin (MULTIVITAMIN WITH MINERALS) TABS tablet Take 1 tablet by mouth daily.    [provider]  omeprazole (PRILOSEC) 20 MG capsule Take 20 mg by mouth daily.    [provider]  ondansetron (ZOFRAN) 8 MG tablet Take 1 tablet (8 mg total) by mouth 2 (two) times daily as needed. Start on the third day after carboplatin and AC chemotherapy. 02/08/21   Magrinat, Virgie Dad, MD  prochlorperazine (COMPAZINE) 10 MG tablet Take 1 tablet (10 mg total) by mouth every 6 (six) hours as needed (Nausea or vomiting). 02/08/21   Magrinat, Virgie Dad, MD  rosuvastatin (CRESTOR) 10 MG tablet TAKE 1 TABLET BY MOUTH ONCE A DAY AT BEDTIME FOR CHOLESTEROL Patient taking differently: Take 10 mg by mouth at bedtime. 12/02/20 12/02/21  Luanne Bras,  FNP    Physical Exam: Vitals:   04/20/21 1748 04/20/21 1752 04/20/21 1800 04/20/21 1830  BP: (!) 128/109  119/79 111/71  Pulse: (!) 113  (!) 113 (!) 115  Resp: 20  (!) 23 17  Temp:  (!) 100.7 F (38.2 C)    TempSrc:  Oral    SpO2: 97%  99% 98%  Weight:      Height:        GENERAL: 55 y.o.-year-old female patient, well-developed, well-nourished lying in the bed in no acute distress.  Pleasant and cooperative.  Alopecia HEENT: Head atraumatic, normocephalic. Pupils equal. Mucus membranes dry NECK: Supple. No JVD. CHEST: Port in right chest, nontender normal breath sounds bilaterally. No wheezing, rales, rhonchi or crackles. No use of accessory muscles of respiration.  No reproducible chest wall tenderness.  CARDIOVASCULAR: S1, S2 normal. No murmurs, rubs, or gallops. Cap refill <2 seconds. Pulses intact distally.  ABDOMEN: Soft, nondistended, nontender. No rebound, guarding, rigidity. Normoactive bowel sounds present in all four quadrants.  EXTREMITIES: No pedal edema, cyanosis, or clubbing. No calf tenderness or Homan's sign.  NEUROLOGIC: The patient is alert and oriented x 3. Cranial nerves II through XII are grossly intact with no focal sensorimotor deficit. PSYCHIATRIC:  Normal affect, mood, thought content. SKIN: Warm, dry, and intact without obvious rash, lesion, or ulcer.    Labs on Admission:  CBC: Recent Labs  Lab 04/20/21 1459  WBC 0.2*  NEUTROABS 0.0*  HGB 8.9*  HCT 25.9*  MCV 90.6  PLT 58*   Basic Metabolic Panel: Recent Labs  Lab 04/20/21 1459  NA 132*  K 3.6  CL 99  CO2 23  GLUCOSE 112*  BUN 18  CREATININE 1.20*  CALCIUM 8.3*   GFR: Estimated Creatinine Clearance: 75.2 mL/min (A) (by C-G formula based on SCr of 1.2 mg/dL (H)). Liver Function Tests: Recent Labs  Lab 04/20/21 1459  AST 14*  ALT 22  ALKPHOS 57  BILITOT 1.2  PROT 6.4*  ALBUMIN 3.4*   No results for input(s): LIPASE, AMYLASE in the last 168 hours. No results for input(s):  AMMONIA in the last 168 hours. Coagulation Profile: No results for input(s): INR, PROTIME in the last 168 hours. Cardiac Enzymes: No results for input(s): CKTOTAL, CKMB, CKMBINDEX, TROPONINI in the last 168 hours. BNP (last 3 results) No results for input(s): PROBNP in the last 8760 hours. HbA1C: No results for input(s): HGBA1C in the last 72 hours. CBG: No results  for input(s): GLUCAP in the last 168 hours. Lipid Profile: No results for input(s): CHOL, HDL, LDLCALC, TRIG, CHOLHDL, LDLDIRECT in the last 72 hours. Thyroid Function Tests: No results for input(s): TSH, T4TOTAL, FREET4, T3FREE, THYROIDAB in the last 72 hours. Anemia Panel: No results for input(s): VITAMINB12, FOLATE, FERRITIN, TIBC, IRON, RETICCTPCT in the last 72 hours. Urine analysis:    Component Value Date/Time   COLORURINE YELLOW 04/20/2021 1505   APPEARANCEUR HAZY (A) 04/20/2021 1505   LABSPEC 1.015 04/20/2021 1505   PHURINE 5.0 04/20/2021 1505   GLUCOSEU NEGATIVE 04/20/2021 1505   HGBUR MODERATE (A) 04/20/2021 1505   BILIRUBINUR NEGATIVE 04/20/2021 1505   KETONESUR NEGATIVE 04/20/2021 1505   PROTEINUR NEGATIVE 04/20/2021 1505   UROBILINOGEN 0.2 10/23/2010 2031   NITRITE NEGATIVE 04/20/2021 1505   LEUKOCYTESUR NEGATIVE 04/20/2021 1505   Sepsis Labs: _0 (procalcitonin:4,lacticidven:4) )No results found for this or any previous visit (from the past 240 hour(s)).   Radiological Exams on Admission: DG Chest 2 View  Result Date: 04/20/2021 CLINICAL DATA:  [facility] - work up [employer] for possible infection due to chemotherapy EXAM: CHEST - 2 VIEW COMPARISON:  Radiograph 02/21/2021, chest CT 02/15/2021 FINDINGS: Chest port catheter tip overlies the distal superior vena cava. Unchanged cardiomediastinal silhouette. There is no new focal airspace disease. No pleural effusion or visible pneumothorax. No acute osseous abnormality. IMPRESSION: No evidence of acute cardiopulmonary disease. Electronically Signed    By: Maurine Simmering   On: 04/20/2021 15:07     Assessment/Plan  This is a 55 y.o. female with a history of 55 y.o. female with a known history of breast cancer currently on chemotherapy, hypertension, hyperlipidemia, CKD, GERD, impaired glucose tolerance now being admitted with:  #.  Neutropenic fever with pancytopenia - Admit inpatient with neutropenic precautions - Continue broad-spectrum antibiotics-cefepime and vancomycin - Follow-up cultures of blood and urine - Check stool culture and CDiff - Oncology is aware of patient's admission and will see her in the hospital.  #.  CKD, near baseline - Follow CMP  #.  History of hypertension - Continue Cozaar  #. History of hyperlipidemia - Continue Crestor  #. History of GERD - Continue Prilosec  #. History of H/o Diabetes - Accuchecks achs with RISS coverage - Heart healthy, carb controlled diet  Admission status: Inpatient IV Fluids: Normal saline Diet/Nutrition: Heart healthy, carb controlled Consults called: Oncology is aware and will see the patient in the hospital DVT Px: SCDs and early ambulation.  Lovenox contraindicated 2/2 thrombytopenia Code Status: Full Code  Disposition Plan: To home in 1-2 days  All the records are reviewed and case discussed with ED provider. Management plans discussed with the patient and/or family who express understanding and agree with plan of care.  Denay Pleitez D.O. on 04/20/2021 at 7:27 PM CC: Primary care physician; Fanny Bien, MD   04/20/2021, 7:27 PM

## 2021-04-20 NOTE — Progress Notes (Signed)
A consult was received from an ED physician for Cefepime per pharmacy dosing.  The patient's profile has been reviewed for ht/wt/allergies/indication/available labs.   A one time order has been placed for Cefepime 2g.  Further antibiotics/pharmacy consults should be ordered by admitting physician if indicated.                       Thank you,  Gretta Arab PharmD, BCPS Clinical Pharmacist [facility] main pharmacy [phone] 04/20/2021 5:58 PM

## 2021-04-20 NOTE — Telephone Encounter (Signed)
Orders entered for Sepsis work up with this RN informing pt to go to Radiology before checking in here.

## 2021-04-20 NOTE — Telephone Encounter (Addendum)
This RN called pt back- informing her concern with Covid vs Sepsis and need to be evaluated today- and proceeding to the ER may be most prudent.  She states her brother has a home Covid test and she will be doing it.  She would rather come here if it is negative for further work up if needed.  This RN explained and reiterated concern that she get evaluated today.  She will call this RN back post home testing for further recommendations.  Pt obtained home test- with negative reading   Pt will come in to this office for work up for possible neutropenic sepsis

## 2021-04-20 NOTE — Progress Notes (Signed)
[place]  [phone] [phone]    ID: Leslie Wright DOB: [birth year]  MR#: [id number]  LHT#:[id number]  Patient Care Team: Fanny Bien, MD as PCP - General (Family Medicine) Herminio Commons, MD (Inactive) as PCP - Cardiology (Cardiology) Mauro Kaufmann, RN as Oncology Nurse Navigator Rockwell Germany, RN as Oncology Nurse Navigator Stark Klein, MD as Consulting Physician (General Surgery) Kyung Rudd, MD as Consulting Physician (Radiation Oncology) Scot Dock, NP OTHER MD:     CHIEF COMPLAINT: Bilateral breast cancers  CURRENT TREATMENT: Neoadjuvant chemotherapy   INTERVAL HISTORY: Leslie Wright returns today for follow up and treatment of her bilateral breast cancers.   She began neoadjuvant chemotherapy, consisting of pembrolizumab, carboplatin, and paclitaxel, on 02/10/2021.  She developed peripheral neuropathy and stopped the Paclitaxel/Carboplatin early.  She began Doxorubicin and cyclophosphamide last week and today is day 8.  She did receive Ziextenzo for growth factor support.    She notes she developed shaking chills overnight.  She has increased heart rate, and is drinking, but feels poorly.  She did not have a fever.  She has had a couple of episodes of diarrhea/  She denies nausea, vomiting, or mucositis.  She took an at home covid 19 test which was negative.    We are following her TSH noted below.    Results for JANALYN, HIGBY" (MRN [id number]) as of 04/13/2021 09:37  Ref. Range 09/27/2016 13:42 02/17/2021 08:15 02/23/2021 10:57 03/02/2021 09:01 03/23/2021 08:37  TSH Latest Ref Range: 0.308 - 3.960 uIU/mL 1.890 2.733 1.316 1.527 1.823    REVIEW OF SYSTEMS: Review of Systems  Constitutional:  Positive for fatigue. Negative for appetite change, chills, fever and unexpected weight change.  HENT:   Negative for hearing loss, lump/mass and trouble swallowing.   Eyes:  Negative for eye problems and icterus.   Respiratory:  Negative for chest tightness, cough and shortness of breath.   Cardiovascular:  Negative for chest pain, leg swelling and palpitations.  Gastrointestinal:  Positive for diarrhea. Negative for abdominal distention, abdominal pain, constipation, nausea and vomiting.  Endocrine: Negative for hot flashes.  Genitourinary:  Negative for difficulty urinating.   Musculoskeletal:  Negative for arthralgias.  Skin:  Negative for itching and rash.  Neurological:  Positive for numbness. Negative for dizziness, extremity weakness and headaches.  Hematological:  Negative for adenopathy. Does not bruise/bleed easily.  Psychiatric/Behavioral:  Negative for depression. The patient is not nervous/anxious.       COVID 19 VACCINATION STATUS: Status post [place] x2, no booster as of 01/24/2021   HISTORY OF CURRENT ILLNESS: From the original intake note:  BERLIN VIERECK" presented with a palpable left breast lump. She underwent bilateral diagnostic mammography with tomography and bilateral breast ultrasonography at [facility] on 01/12/2021 showing: breast density category C; 5.8 cm mass in the left breast at 12:30 with associated calcifications; additional 1.2 cm group of calcifications in the outer left breast;   On the right: Multiple small masses and irregular ducts in the retroareolar right breast, including a 0.8 cm representative mass at 6 o'clock; indeterminate 0.4 cm mass in upper-inner right breast; normal lymph nodes bilaterally.  Accordingly on 01/16/2021 she proceeded to biopsy of the right breast areas in question. The pathology from this procedure ([id number]) showed:  1. Right Breast, 6 o'clock  - invasive ductal carcinoma, grade 2. Prognostic indicators significant for: estrogen receptor, 90% positive with strong staining intensity and progesterone receptor, 0% negative. Proliferation marker  Ki67 at 5%. HER2 equivocal by immunohistochemistry (2+), but negative by  fluorescent in situ hybridization with a signals ratio 1.61 and number per cell 2.5. 2. Right Breast, 9 o'clock  - fibrocystic change with adenosis, calcifications, and a small intraductal papilloma.  She underwent biopsy of the left breast areas on 01/20/2021. Pathology [id number]) revealed: 1. Left Breast, upper-outer quadrant  - invasive ductal carcinoma, grade 3 2. Left Breast, central, slightly lateral to midline  - ductal carcinoma in situ with necrosis and calcifications, intermediate grade Prognostic panels are pending on both samples.  Cancer Staging Malignant neoplasm of lower-outer quadrant of right breast of female, estrogen receptor positive ([place]) Staging form: Breast, AJCC 8th Edition - Clinical stage from 01/20/2021: Stage IA (cT1c, cN0, cM0, G2, ER+, PR-, HER2-) - Signed by Chauncey Cruel, MD on 01/25/2021 Stage prefix: Initial diagnosis Histologic grading system: 3 grade system  Malignant neoplasm of overlapping sites of left breast in female, estrogen receptor negative ([place]) Staging form: Breast, AJCC 8th Edition - Clinical stage from 01/20/2021: Stage IIIB (cT3, cN0, cM0, G3, ER-, PR-, HER2-) - Signed by Chauncey Cruel, MD on 01/25/2021 Histologic grading system: 3 grade system  The patient's subsequent history is as detailed below.   PAST MEDICAL HISTORY: Past Medical History:  Diagnosis Date   Acid reflux    Anemia    Arthritis    "maybe in my fingers" (03/26/2018)   Breast cancer ([place]) 12/2020   both sides   Concussion 2001   no deficit had tia right after no problems since   Family history of adverse reaction to anesthesia    "brother, Timmy, and my mom always get sick" (03/26/2018)   Family history of brain cancer    Family history of breast cancer    Family history of colon cancer    Family history of lung cancer    Family history of pancreatic cancer    Family history of prostate cancer    Headache    High blood pressure    High cholesterol     Hip pain    "left; before OR" (03/26/2018)   History of chemotherapy last done 02-17-2021   History of hiatal hernia    History of kidney stones    OSA (obstructive sleep apnea)    "can't tolerate the mask" (03/26/2018)    PONV (postoperative nausea and vomiting)    does not need much anesthesia, slow to awlaekn in past   Pre-diabetes    Shortness of breath    with exertion   Wears partial dentures upper    PAST SURGICAL HISTORY: Past Surgical History:  Procedure Laterality Date   CARPAL TUNNEL RELEASE Right ~ 2006   JOINT REPLACEMENT     LAPAROSCOPIC CHOLECYSTECTOMY  12/2010   LASIK Bilateral 2000   PORT A CATH REVISION Left 02/21/2021   Procedure: PORT A CATH REVISION;  Surgeon: Stark Klein, MD;  Location: [facility];  Service: General;  Laterality: Left;  60 MINUTES ROOM 5   PORTACATH PLACEMENT N/A 02/08/2021   Procedure: INSERTION PORT-A-CATH;  Surgeon: Stark Klein, MD;  Location: [facility];  Service: General;  Laterality: N/A;   TOTAL HIP ARTHROPLASTY Left 03/25/2018   Procedure: LEFT TOTAL HIP ARTHROPLASTY ANTERIOR APPROACH;  Surgeon: Mcarthur Rossetti, MD;  Location: [facility];  Service: Orthopedics;  Laterality: Left;    FAMILY HISTORY: Family History  Problem Relation Age of Onset   Diabetes Mother    Thyroid disease Mother    Liver  disease Mother    Obesity Mother    Hypertension Father    Hyperlipidemia Father    Heart disease Father    Stroke Father    Obesity Father    Colon polyps Father    Prostate cancer Brother    Brain cancer Maternal Grandfather    Pancreatic cancer Paternal Grandmother    Colon cancer Neg Hx        No one in family with colon cancer.  The patient's father is 48 years old as of 01/19/2021.  The patient's mother died from cirrhosis associated with hepatitis C (from a transfusion) age 70.  The patient has 3 brothers, no sisters.  1 brother has a history of prostate cancer at age 22.  A maternal grandfather had a history  of central nervous system carcinoma.  A paternal grandmother had a history of pancreatic cancer and a maternal aunt had a history of breast cancer in her late 46s.   GYNECOLOGIC HISTORY:  No LMP recorded (lmp unknown). Patient is postmenopausal. Menarche: 55 years old [place] P 0 LMP 53 HRT no  Hysterectomy? no BSO? no   SOCIAL HISTORY: (updated 2021/01/19)  Leslie Wright "Leslie Wright" works for [employer] in [employer].  She works from home.  She is a Engineer, mining.  She describes herself as single, lives by herself, with a toy poodle and a [place] as well as 2 cats.  ADVANCED DIRECTIVES: The patient's brother Tauheedah Bok is her healthcare power of attorney.  He can be reached at [phone]   HEALTH MAINTENANCE: Social History   Tobacco Use   Smoking status: Former    Types: Cigarettes   Smokeless tobacco: Never   Tobacco comments:    In teens social  Vaping Use   Vaping Use: Never used  Substance Use Topics   Alcohol use: Yes    Comment: Occasional   Drug use: Never     Colonoscopy:   PAP: 03/2011  Bone density: scheduled for 07/2021   No Known Allergies  Current Outpatient Medications  Medication Sig Dispense Refill   acetaminophen (TYLENOL) 500 MG tablet Take 1,000 mg by mouth every 6 (six) hours as needed for moderate pain.     amoxicillin-clavulanate (AUGMENTIN) 875-125 MG tablet Take 1 tablet by mouth 2 (two) times daily. 14 tablet 0   aspirin EC 81 MG tablet Take 81 mg by mouth daily.     betamethasone valerate ointment (VALISONE) 0.1 % Apply 1 application topically 2 (two) times daily. 30 g 0   Blood Glucose Monitoring Suppl (FREESTYLE LITE) w/Device KIT CHECK GLUCOSE 2-3 TIMES DAILY 1 kit 0   Cholecalciferol (VITAMIN D) 50 MCG (2000 UT) CAPS Take 2,000 Units by mouth daily.     clindamycin (CLINDAGEL) 1 % gel Apply topically 2 (two) times daily. 30 g 0   dexamethasone (DECADRON) 4 MG tablet Take 2 tablets once a day for 3 days after carboplatin and AC chemotherapy. Take  with food. 30 tablet 1   ELDERBERRY PO Take 1 capsule by mouth daily.     folic acid (FOLVITE) 1 MG tablet Take 1 mg by mouth daily.     glucose blood test strip USE AS DIRECTED TO CHECK BLOOD SUGAR 1 TO 3 TIMES A DAY (Patient taking differently: USE AS DIRECTED TO CHECK BLOOD SUGAR 1 TO 3 TIMES A DAY) 100 strip 3   ketoconazole (NIZORAL) 2 % cream Apply 1 application topically daily. (Patient taking differently: Apply 1 application topically daily as needed.) 15  g 0   Lancets (FREESTYLE) lancets USE AS DIRECTED TO CHECK BLOOD SUGAR 2 TO 3 TIMES A DAY (Patient taking differently: USE AS DIRECTED TO CHECK BLOOD SUGAR 2 TO 3 TIMES A DAY) 100 each 3   lidocaine-prilocaine (EMLA) cream Apply to affected area once 30 g 3   losartan (COZAAR) 100 MG tablet Take 1 tablet by mouth daily 90 tablet 3   methotrexate (RHEUMATREX) 15 MG tablet Take 15 mg by mouth every Wednesday. Caution: Chemotherapy. Protect from light.     Multiple Vitamin (MULTIVITAMIN WITH MINERALS) TABS tablet Take 1 tablet by mouth daily.     omeprazole (PRILOSEC) 20 MG capsule Take 20 mg by mouth daily.     ondansetron (ZOFRAN) 8 MG tablet Take 1 tablet (8 mg total) by mouth 2 (two) times daily as needed. Start on the third day after carboplatin and AC chemotherapy. 30 tablet 1   prochlorperazine (COMPAZINE) 10 MG tablet Take 1 tablet (10 mg total) by mouth every 6 (six) hours as needed (Nausea or vomiting). 30 tablet 1   rosuvastatin (CRESTOR) 10 MG tablet TAKE 1 TABLET BY MOUTH ONCE A DAY AT BEDTIME FOR CHOLESTEROL (Patient taking differently: Take 10 mg by mouth at bedtime.) 90 tablet 1   No current facility-administered medications for this visit.    OBJECTIVE:   Vitals:   04/20/21 1534  BP: 115/66  Pulse: (!) 118  Resp: 20  Temp: 97.9 F (36.6 C)  SpO2: 98%      Body mass index is 46.8 kg/m.   Wt Readings from Last 3 Encounters:  04/20/21 298 lb 12.8 oz (135.5 kg)  04/13/21 (!) 301 lb 6.4 oz (136.7 kg)  04/06/21 (!)  302 lb 14.4 oz (137.4 kg)   GENERAL: Patient appears tired, and like she feels poorly HEENT:  Sclerae anicteric.  Oropharynx clear and moist. No ulcerations or evidence of oropharyngeal candidiasis. Neck is supple.  NODES:  No cervical, supraclavicular, or axillary lymphadenopathy palpated.  BREAST EXAM:  Deferred. LUNGS:  Clear to auscultation bilaterally.  No wheezes or rhonchi. HEART:  Regular rate and rhythm. No murmur appreciated. ABDOMEN:  Soft, nontender.  Positive, normoactive bowel sounds. No organomegaly palpated. EXTREMITIES:  No peripheral edema.   SKIN:  Clear with no obvious rashes or skin changes. No nail dyscrasia. NEURO:  Nonfocal. Well oriented.  Appropriate affect.     LAB RESULTS:  CMP     Component Value Date/Time   NA 132 (L) 04/20/2021 1459   NA 143 12/02/2018 1138   K 3.6 04/20/2021 1459   CL 99 04/20/2021 1459   CO2 23 04/20/2021 1459   GLUCOSE 112 (H) 04/20/2021 1459   BUN 18 04/20/2021 1459   BUN 17 12/02/2018 1138   CREATININE 1.20 (H) 04/20/2021 1459   CREATININE 1.09 (H) 02/18/2019 1524   CALCIUM 8.3 (L) 04/20/2021 1459   PROT 6.4 (L) 04/20/2021 1459   PROT 6.9 12/02/2018 1138   ALBUMIN 3.4 (L) 04/20/2021 1459   ALBUMIN 4.0 12/02/2018 1138   AST 14 (L) 04/20/2021 1459   ALT 22 04/20/2021 1459   ALKPHOS 57 04/20/2021 1459   BILITOT 1.2 04/20/2021 1459   GFRNONAA 53 (L) 04/20/2021 1459   GFRAA 70 12/02/2018 1138    No results found for: TOTALPROTELP, ALBUMINELP, A1GS, A2GS, BETS, BETA2SER, GAMS, MSPIKE, SPEI  Lab Results  Component Value Date   WBC 0.2 (LL) 04/20/2021   NEUTROABS PENDING 04/20/2021   HGB 8.9 (L) 04/20/2021   HCT 25.9 (L) 04/20/2021  MCV 90.6 04/20/2021   PLT 58 (L) 04/20/2021    No results found for: LABCA2  No components found for: RCVELF810  No results for input(s): INR in the last 168 hours.  No results found for: LABCA2  No results found for: FBP102  No results found for: HEN277  No results found  for: OEU235  No results found for: CA2729  No components found for: HGQUANT  No results found for: CEA1 / No results found for: CEA1   No results found for: AFPTUMOR  No results found for: CHROMOGRNA  No results found for: KPAFRELGTCHN, LAMBDASER, KAPLAMBRATIO (kappa/lambda light chains)  No results found for: HGBA, HGBA2QUANT, HGBFQUANT, HGBSQUAN (Hemoglobinopathy evaluation)   No results found for: LDH  No results found for: IRON, TIBC, IRONPCTSAT (Iron and TIBC)  No results found for: FERRITIN  Urinalysis    Component Value Date/Time   COLORURINE YELLOW 10/23/2010 2031   APPEARANCEUR CLEAR 10/23/2010 2031   LABSPEC 1.025 10/23/2010 2031   PHURINE 6.0 10/23/2010 2031   GLUCOSEU NEGATIVE 03/12/2009 0843   HGBUR MODERATE (A) 10/23/2010 2031   BILIRUBINUR NEGATIVE 10/23/2010 2031   KETONESUR NEGATIVE 10/23/2010 2031   PROTEINUR TRACE (A) 10/23/2010 2031   UROBILINOGEN 0.2 10/23/2010 2031   NITRITE NEGATIVE 10/23/2010 2031   LEUKOCYTESUR NEGATIVE 10/23/2010 2031    STUDIES: DG Chest 2 View  Result Date: 04/20/2021 CLINICAL DATA:  CHILLS - work up [employer] for possible infection due to chemotherapy EXAM: CHEST - 2 VIEW COMPARISON:  Radiograph 02/21/2021, chest CT 02/15/2021 FINDINGS: Chest port catheter tip overlies the distal superior vena cava. Unchanged cardiomediastinal silhouette. There is no new focal airspace disease. No pleural effusion or visible pneumothorax. No acute osseous abnormality. IMPRESSION: No evidence of acute cardiopulmonary disease. Electronically Signed   By: Maurine Simmering   On: 04/20/2021 15:07      ELIGIBLE FOR AVAILABLE RESEARCH PROTOCOL:   ASSESSMENT: 55 y.o. [place] woman presenting with bilateral breast cancers April 2022 as follows:  (1) in the right breast, a clinical T2 N0-1, stage IIA-IIB invasive ductal carcinoma, grade 2, estrogen receptor positive, progesterone receptor and HER2 negative, with an MIB-1 of 5%;  (a)  a second,  retroareolar mass was benign but discordant; biopsy 02/07/2021  (b) borderline right axillary node not amenable to biopsy  (c) extensive patchy enhancement noted throughout the inferior right breast  (2) in the left breast, a clinical T3 N0, stage IIIB invasive ductal carcinoma, grade 3, functionally triple negative, with an MIB-1 of 25% (HER2 results are pending).  (a) secondary biopsy shows ductal carcinoma in situ associated with extensive patchy enhancement  (3) staging studies:  (a) chest CT scan 02/06/2021 shows hepatic steatosis and degenerative disc disease, no evidence of metastatic disease  (b) bone scan 02/06/2021 shows no evidence of metastatic disease  (4) neoadjuvant chemotherapy will start 02/09/2021 consisting of pembrolizumab/carboplatin/paclitaxel stopped one cycle early due to peripheral neuropathy followed by pembrolizumab/doxorubicin/cyclophosphamide  (5) definitive surgery to follow  (6) adjuvant radiation to bilateral sites  (7) Negative genetic testing on 02/13/2021. VUS in ATM called c.1132A>G identified.  Genes tested include: AIP, ALK, APC, ATM, AXIN2,BAP1,  BARD1, BLM, BMPR1A, BRCA1, BRCA2, BRIP1, CASR, CDC73, CDH1, CDK4, CDKN1B, CDKN1C, CDKN2A (p14ARF), CDKN2A (p16INK4a), CEBPA, CHEK2, CTNNA1, DICER1, DIS3L2, EGFR (c.2369C>T, p.Thr790Met variant only), EPCAM (Deletion/duplication testing only), FH, FLCN, GATA2, GPC3, GREM1 (Promoter region deletion/duplication testing only), HOXB13 (c.251G>A, p.Gly84Glu), HRAS, KIT, MAX, MEN1, MET, MITF (c.952G>A, p.Glu318Lys variant only), MLH1, MSH2, MSH3, MSH6, MUTYH, NBN, NF1, NF2, NTHL1, PALB2,  PDGFRA, PHOX2B, PMS2, POLD1, POLE, POT1, PRKAR1A, PTCH1, PTEN, RAD50, RAD51C, RAD51D, RB1, RECQL4, RET, RUNX1, SDHAF2, SDHA (sequence changes only), SDHB, SDHC, SDHD, SMAD4, SMARCA4, SMARCB1, SMARCE1, STK11, SUFU, TERC, TERT, TMEM127, TP53, TSC1, TSC2, VHL, WRN and WT1.  PLAN: I reviewed with Leslie Wright that I am worried about her.  She looks poor  today and her heart rate is slightly elevated.  Her ANC is 0, and she has had rigors, though technically no fevers at the point of our office visit completion.  She underwent a full work up with blood cultures, chest xray which was negative, and urinalysis which is pending.  I reviewed with her that I am concerned that we are seeing an early febrile neutropenia, and that she needs to get some IV antibiotics.   Leslie Wright reviewed with me that she does not want to be admitted to the hospital.  I reviewed with her that for her safety we need to give her antibiotics, and then she needs to go to the ER for further evaluation and management.  I reviewed that it will be up to the ER whether or not she gets admitted to the hospital.     Total encounter time 30 minutes in chart review, lab review, order entry, care coordination, face to face visit time, and documentation of the encounter.    I reviewed the above with Dr. Lindi Adie, and he is in aware and in agreement with the above plan.    Wilber Bihari, NP 04/20/21 4:05 PM Medical Oncology and Hematology [facility] [place], [place] [address] Tel. [phone]    Fax. [phone]    *Total Encounter Time as defined by the Centers for Medicare and Medicaid Services includes, in addition to the face-to-face time of a patient visit (documented in the note above) non-face-to-face time: obtaining and reviewing outside history, ordering and reviewing medications, tests or procedures, care coordination (communications with other health care professionals or caregivers) and documentation in the medical record.

## 2021-04-20 NOTE — ED Provider Notes (Signed)
[facility] DEPT Provider Note   CSN: [id number] Arrival date & time: 04/20/21  1735     History Chief Complaint  Patient presents with   Generalized Body Aches    Leslie Wright is a 55 y.o. female history of breast cancer on chemo (last chemo was last Thursday and she received pegfilgrastim on 7/16) here presenting with chills and neutropenic fever.  Patient started having chills since yesterday.  Patient had 99 temperature at the office today.  Patient also was noted to be neutropenic with 0 absolute neutrophil count in the office.  Patient's urinalysis and chest x-ray were clear at that time.  Patient was given Rocephin and sent here for admission.  Patient denies any pain around her port.  She has nonproductive cough.   The history is provided by the patient.      Past Medical History:  Diagnosis Date   Acid reflux    Anemia    Arthritis    "maybe in my fingers" (03/26/2018)   Breast cancer ([place]) 12/2020   both sides   Concussion 2001   no deficit had tia right after no problems since   Family history of adverse reaction to anesthesia    "brother, Timmy, and my mom always get sick" (03/26/2018)   Family history of brain cancer    Family history of breast cancer    Family history of colon cancer    Family history of lung cancer    Family history of pancreatic cancer    Family history of prostate cancer    Headache    High blood pressure    High cholesterol    Hip pain    "left; before OR" (03/26/2018)   History of chemotherapy last done 02-17-2021   History of hiatal hernia    History of kidney stones    OSA (obstructive sleep apnea)    "can't tolerate the mask" (03/26/2018)    PONV (postoperative nausea and vomiting)    does not need much anesthesia, slow to awlaekn in past   Pre-diabetes    Shortness of breath    with exertion   Wears partial dentures upper    Patient Active Problem List   Diagnosis Date Noted   Port-A-Cath in  place 03/09/2021   Encounter for antineoplastic chemotherapy 02/17/2021   Encounter for immunotherapy 02/17/2021   Genetic testing 02/14/2021   Hepatic steatosis 02/09/2021   Multilevel degenerative disc disease 02/09/2021   Family history of breast cancer    Family history of pancreatic cancer    Family history of colon cancer    Family history of lung cancer    Family history of prostate cancer    Family history of brain cancer    Malignant neoplasm of overlapping sites of left breast in female, estrogen receptor negative ([place]) 01/25/2021   Malignant neoplasm of lower-outer quadrant of right breast of female, estrogen receptor positive ([place]) 01/23/2021   Status post left hip replacement 05/19/2018   Acute deep vein thrombosis (DVT) of distal end of right lower extremity ([place]) 05/19/2018   Unilateral primary osteoarthritis, left hip 03/25/2018   Status post total replacement of left hip 03/25/2018   Hyperlipidemia 04/11/2017   Class 3 obesity with serious comorbidity and body mass index (BMI) of 40.0 to 44.9 in adult 04/11/2017   Prediabetes 02/04/2017   Mixed hyperlipidemia 01/09/2017   Insulin resistance 11/27/2016   Essential hypertension 10/25/2016   Morbid obesity ([place]) 10/25/2016   Vitamin D deficiency 10/25/2016  Past Surgical History:  Procedure Laterality Date   CARPAL TUNNEL RELEASE Right ~ 2006   JOINT REPLACEMENT     LAPAROSCOPIC CHOLECYSTECTOMY  12/2010   LASIK Bilateral 2000   PORT A CATH REVISION Left 02/21/2021   Procedure: PORT A CATH REVISION;  Surgeon: Stark Klein, MD;  Location: [place];  Service: General;  Laterality: Left;  60 MINUTES ROOM 5   PORTACATH PLACEMENT N/A 02/08/2021   Procedure: INSERTION PORT-A-CATH;  Surgeon: Stark Klein, MD;  Location: [facility];  Service: General;  Laterality: N/A;   TOTAL HIP ARTHROPLASTY Left 03/25/2018   Procedure: LEFT TOTAL HIP ARTHROPLASTY ANTERIOR APPROACH;  Surgeon: Mcarthur Rossetti, MD;   Location: [facility];  Service: Orthopedics;  Laterality: Left;     OB History     Gravida  0   Para  0   Term  0   Preterm  0   AB  0   Living  0      SAB  0   IAB  0   Ectopic  0   Multiple  0   Live Births  0           Family History  Problem Relation Age of Onset   Diabetes Mother    Thyroid disease Mother    Liver disease Mother    Obesity Mother    Hypertension Father    Hyperlipidemia Father    Heart disease Father    Stroke Father    Obesity Father    Colon polyps Father    Prostate cancer Brother    Brain cancer Maternal Grandfather    Pancreatic cancer Paternal Grandmother    Colon cancer Neg Hx        No one in family with colon cancer.    Social History   Tobacco Use   Smoking status: Former    Types: Cigarettes   Smokeless tobacco: Never   Tobacco comments:    In teens social  Vaping Use   Vaping Use: Never used  Substance Use Topics   Alcohol use: Yes    Comment: Occasional   Drug use: Never    Home Medications Prior to Admission medications   Medication Sig Start Date End Date Taking? Authorizing Provider  acetaminophen (TYLENOL) 500 MG tablet Take 1,000 mg by mouth every 6 (six) hours as needed for moderate pain.    [provider]  aspirin EC 81 MG tablet Take 81 mg by mouth daily.    [provider]  betamethasone valerate ointment (VALISONE) 0.1 % Apply 1 application topically 2 (two) times daily. 03/02/21   Nicholas Lose, MD  Blood Glucose Monitoring Suppl (FREESTYLE LITE) w/Device KIT CHECK GLUCOSE 2-3 TIMES DAILY 12/02/20 12/02/21  Luanne Bras, FNP  Cholecalciferol (VITAMIN D) 50 MCG (2000 UT) CAPS Take 2,000 Units by mouth daily.    [provider]  clindamycin (CLINDAGEL) 1 % gel Apply topically 2 (two) times daily. 03/16/21   Causey, Charlestine Massed, NP  dexamethasone (DECADRON) 4 MG tablet Take 2 tablets once a day for 3 days after carboplatin and AC chemotherapy. Take with food. 02/08/21    Magrinat, Virgie Dad, MD  ELDERBERRY PO Take 1 capsule by mouth daily.    [provider]  folic acid (FOLVITE) 1 MG tablet Take 1 mg by mouth daily.    [provider]  glucose blood test strip USE AS DIRECTED TO CHECK BLOOD SUGAR 1 TO 3 TIMES A DAY Patient taking differently:  USE AS DIRECTED TO CHECK BLOOD SUGAR 1 TO 3 TIMES A DAY 12/02/20 12/02/21  Luanne Bras, FNP  ketoconazole (NIZORAL) 2 % cream Apply 1 application topically daily. Patient taking differently: Apply 1 application topically daily as needed. 01/24/21   Magrinat, Virgie Dad, MD  Lancets (FREESTYLE) lancets USE AS DIRECTED TO CHECK BLOOD SUGAR 2 TO 3 TIMES A DAY Patient taking differently: USE AS DIRECTED TO CHECK BLOOD SUGAR 2 TO 3 TIMES A DAY 12/02/20 12/02/21  Luanne Bras, FNP  lidocaine-prilocaine (EMLA) cream Apply to affected area once 02/08/21   Magrinat, Virgie Dad, MD  losartan (COZAAR) 100 MG tablet Take 1 tablet by mouth daily 02/03/21     methotrexate (RHEUMATREX) 15 MG tablet Take 15 mg by mouth every Wednesday. Caution: Chemotherapy. Protect from light.    [provider]  Multiple Vitamin (MULTIVITAMIN WITH MINERALS) TABS tablet Take 1 tablet by mouth daily.    [provider]  omeprazole (PRILOSEC) 20 MG capsule Take 20 mg by mouth daily.    [provider]  ondansetron (ZOFRAN) 8 MG tablet Take 1 tablet (8 mg total) by mouth 2 (two) times daily as needed. Start on the third day after carboplatin and AC chemotherapy. 02/08/21   Magrinat, Virgie Dad, MD  prochlorperazine (COMPAZINE) 10 MG tablet Take 1 tablet (10 mg total) by mouth every 6 (six) hours as needed (Nausea or vomiting). 02/08/21   Magrinat, Virgie Dad, MD  rosuvastatin (CRESTOR) 10 MG tablet TAKE 1 TABLET BY MOUTH ONCE A DAY AT BEDTIME FOR CHOLESTEROL Patient taking differently: Take 10 mg by mouth at bedtime. 12/02/20 12/02/21  Luanne Bras, [place]    Allergies    Patient has no known allergies.  Review of Systems   Review  of Systems  Constitutional:  Positive for fever.  All other systems reviewed and are negative.  Physical Exam Updated Vital Signs BP (!) 128/109   Pulse (!) 113   Temp (!) 100.7 F (38.2 C) (Oral)   Resp 20   Ht _0  (1.702 m)   Wt 135.2 kg   LMP  (LMP Unknown)   SpO2 97%   BMI 46.67 kg/m   Physical Exam Vitals and nursing note reviewed.  Constitutional:      Comments: Chronically ill   HENT:     Head: Normocephalic.     Nose: Nose normal.     Mouth/Throat:     Mouth: Mucous membranes are dry.  Eyes:     Extraocular Movements: Extraocular movements intact.     Pupils: Pupils are equal, round, and reactive to light.  Cardiovascular:     Rate and Rhythm: Regular rhythm. Tachycardia present.     Pulses: Normal pulses.     Heart sounds: Normal heart sounds.     Comments: Port in the right chest with no obvious signs of cellulitis Abdominal:     General: Abdomen is flat.     Palpations: Abdomen is soft.  Musculoskeletal:        General: Normal range of motion.     Cervical back: Normal range of motion and neck supple.  Skin:    General: Skin is warm.     Capillary Refill: Capillary refill takes less than 2 seconds.  Neurological:     General: No focal deficit present.     Mental Status: She is oriented to person, place, and time.  Psychiatric:        Mood and Affect: Mood normal.  Behavior: Behavior normal.    ED Results / Procedures / Treatments   Labs (all labs ordered are listed, but only abnormal results are displayed) Labs Reviewed  RESP PANEL BY RT-PCR (FLU A&B, COVID) [id number]    EKG None  Radiology DG Chest 2 View  Result Date: 04/20/2021 CLINICAL DATA:  CHILLS - work up [employer] for possible infection due to chemotherapy EXAM: CHEST - 2 VIEW COMPARISON:  Radiograph 02/21/2021, chest CT 02/15/2021 FINDINGS: Chest port catheter tip overlies the distal superior vena cava. Unchanged cardiomediastinal silhouette. There is no new focal airspace  disease. No pleural effusion or visible pneumothorax. No acute osseous abnormality. IMPRESSION: No evidence of acute cardiopulmonary disease. Electronically Signed   By: Maurine Simmering   On: 04/20/2021 15:07    Procedures Procedures   CRITICAL CARE Performed by: Wandra Arthurs   Total critical care time:30 minutes  Critical care time was exclusive of separately billable procedures and treating other patients.  Critical care was necessary to treat or prevent imminent or life-threatening deterioration.  Critical care was time spent personally by me on the following activities: development of treatment plan with patient and/or surrogate as well as nursing, discussions with consultants, evaluation of patient's response to treatment, examination of patient, obtaining history from patient or surrogate, ordering and performing treatments and interventions, ordering and review of laboratory studies, ordering and review of radiographic studies, pulse oximetry and re-evaluation of patient's condition.   Medications Ordered in ED Medications  vancomycin (VANCOCIN) IVPB 1000 mg/200 mL premix (has no administration in time range)  sodium chloride 0.9 % bolus 1,000 mL (has no administration in time range)  ceFEPIme (MAXIPIME) 2 g in sodium chloride 0.9 % 100 mL IVPB (has no administration in time range)  acetaminophen (TYLENOL) tablet 650 mg (has no administration in time range)    ED Course  I have reviewed the triage vital signs and the nursing notes.  Pertinent labs & imaging results that were available during my care of the patient were reviewed by me and considered in my medical decision making (see chart for details).    MDM Rules/Calculators/A&P                          CHANTAVIA BAZZLE is a 55 y.o. female here presenting with neutropenic fever.  Patient is febrile 100.7 in the ED and tachycardic.  I reviewed the oncology notes and labs from oncology.  Patient has 0 neutrophil count.  I discussed  case with Dr. Lorenso Courier from oncology.  He does recommend IV antibiotics and hospitalist admission.  Blood cultures were drawn at oncology    Final Clinical Impression(s) / ED Diagnoses Final diagnoses:  None    Rx / DC Orders ED Discharge Orders     None        Drenda Freeze, MD 04/20/21 1904

## 2021-04-20 NOTE — Progress Notes (Signed)
Patient transferred via wheelchair to ED Room 13. Bedside handoff report given to Lynford Humphrey, RN.

## 2021-04-20 NOTE — Patient Instructions (Signed)
Ceftriaxone Injection What is this medication? CEFTRIAXONE (sef try AX one) treats infections caused by bacteria. It belongs to a group of medications called cephalosporin antibiotics. It will not treatcolds, the flu, or infections caused by viruses. This medicine may be used for other purposes; ask your health care provider orpharmacist if you have questions. COMMON BRAND NAME(S): Ceftrisol Plus, Rocephin What should I tell my care team before I take this medication? They need to know if you have any of these conditions: Bleeding disorder High bilirubin level in newborn patients Kidney disease Liver disease Poor nutrition An unusual or allergic reaction to ceftriaxone, other penicillin or cephalosporin antibiotics, other medicines, foods, dyes, or preservatives Pregnant or trying to get pregnant Breast-feeding How should I use this medication? This medication is injected into a vein or into a muscle. It is usually given by a health care provider in a hospital or clinic setting. It may also be givenat home. If you get this medication at home, you will be taught how to prepare and give it. Use exactly as directed. Take it as directed on the prescription label at the same time every day. Take all of this medication unless your care teamtells you to stop it early. Keep taking it even if you think you are better. It is important that you put your used needles and syringes in a special sharps container. Do not put them in a trash can. If you do not have a sharpscontainer, call your care team to get one. Talk to your care team about the use of this medication in children. While it may be prescribed for children as young as newborns for selected conditions,precautions do apply. Overdosage: If you think you have taken too much of this medicine contact apoison control center or emergency room at once. NOTE: This medicine is only for you. Do not share this medicine with others. What if I miss a dose? If  you get this medication at the hospital or clinic: It is important not tomiss your dose. Call your care team if you are unable to keep an appointment. If you give yourself this medication at home: If you miss a dose, take it as soon as you can. Then continue your normal schedule. If it is almost time for your next dose, take only that dose. Do not take double or extra doses. Callyour care team with questions. What may interact with this medication? Birth control pills Intravenous calcium This list may not describe all possible interactions. Give your health care provider a list of all the medicines, herbs, non-prescription drugs, or dietary supplements you use. Also tell them if you smoke, drink alcohol, or use illegaldrugs. Some items may interact with your medicine. What should I watch for while using this medication? Tell your care team if your symptoms do not start to get better or if they getworse. Do not treat diarrhea with over the counter products. Contact your care team ifyou have diarrhea that lasts more than 2 days or if it is severe and watery. If you have diabetes, you may get a false-positive result for sugar in yoururine. Check with your care team. If you are being treated for a sexually transmitted disease (STD), avoid sexual contact until you have finished your treatment. Your sexual partner may alsoneed treatment. What side effects may I notice from receiving this medication? Side effects that you should report to your care team as soon as possible: Allergic reactions-skin rash, itching, hives, swelling of the face, lips, tongue, or  throat Confusion Drowsiness Gallbladder problems-severe stomach pain, nausea, vomiting, fever Kidney injury-decrease in the amount of urine, swelling of the ankles, hands, or feet Kidney stones-blood in the urine, pain or trouble passing urine, pain in the lower back or sides Low red blood cell count-unusual weakness or fatigue, dizziness, headache,  trouble breathing Pancreatitis-severe stomach pain that spreads to your back or gets worse after eating or when touched, fever, nausea, vomiting Seizures Severe diarrhea, fever Unusual weakness or fatigue Side effects that usually do not require medical attention (report to your careteam if they continue or are bothersome): Diarrhea This list may not describe all possible side effects. Call your doctor for medical advice about side effects. You may report side effects to FDA at1-800-FDA-1088. Where should I keep my medication? Keep out of the reach of children and pets. You will be instructed on how to store this medication. Get rid of any unusedmedication after the expiration date. To get rid of medications that are no longer needed or have expired: Take the medication to a medication take-back program. Check with your pharmacy or law enforcement to find a location. If you cannot return the medication, ask your care team how to get rid of this medication safely. NOTE: This sheet is a summary. It may not cover all possible information. If you have questions about this medicine, talk to your doctor, pharmacist, orhealth care provider.  2022 Elsevier/Gold Standard (2020-10-25 09:56:16)

## 2021-04-20 NOTE — ED Triage Notes (Signed)
Pt sent over from the cancer center. Staff states pt had a low WBC count. Pt reports having body aches, fatigue since yesterday.

## 2021-04-20 NOTE — Telephone Encounter (Signed)
This RN spoke with pt per her VM stating she is having " chills, stopped up sinuses not fever ( 97.6) "  She is scheduled for Nadir check tomorrow post cycle 1 of A/C with Beryle Flock 7/14 with neulasta support.  She denies any chest congestion and only a mild cough " from the sinus drainage ".  She does not have home Covid test supplies.  This RN informed pt above would be reviewed with [facility]/NP for further recommendations.

## 2021-04-20 NOTE — Telephone Encounter (Addendum)
This RN contacted the ER charge nurse per request by covering provider for pt to proceed to the ER for further work up and possible admission for neutropenia with chills for possible sepsis.  Informed her pt is day 7 of cycle 1 of adriamycin/cytoxan with [place] with onset of fatigue, malaise,chills with negative home Covid test.  Per protocol - Rocephin will be given in this office via port.  Per charge nurse - room will be ready - room 13 in the ER.

## 2021-04-21 ENCOUNTER — Encounter: Payer: Self-pay | Admitting: Oncology

## 2021-04-21 ENCOUNTER — Other Ambulatory Visit: Payer: 59

## 2021-04-21 ENCOUNTER — Ambulatory Visit: Payer: 59

## 2021-04-21 ENCOUNTER — Encounter: Payer: Self-pay | Admitting: Hematology and Oncology

## 2021-04-21 ENCOUNTER — Ambulatory Visit: Payer: 59 | Admitting: Adult Health

## 2021-04-21 DIAGNOSIS — N183 Chronic kidney disease, stage 3 unspecified: Secondary | ICD-10-CM | POA: Diagnosis present

## 2021-04-21 DIAGNOSIS — Z6841 Body Mass Index (BMI) 40.0 and over, adult: Secondary | ICD-10-CM

## 2021-04-21 DIAGNOSIS — D709 Neutropenia, unspecified: Secondary | ICD-10-CM | POA: Diagnosis not present

## 2021-04-21 DIAGNOSIS — C50511 Malignant neoplasm of lower-outer quadrant of right female breast: Secondary | ICD-10-CM

## 2021-04-21 DIAGNOSIS — I10 Essential (primary) hypertension: Secondary | ICD-10-CM

## 2021-04-21 DIAGNOSIS — N179 Acute kidney failure, unspecified: Secondary | ICD-10-CM

## 2021-04-21 DIAGNOSIS — N1831 Chronic kidney disease, stage 3a: Secondary | ICD-10-CM

## 2021-04-21 LAB — CBC
HCT: 22.7 % — ABNORMAL LOW (ref 36.0–46.0)
Hemoglobin: 7.6 g/dL — ABNORMAL LOW (ref 12.0–15.0)
MCH: 31.1 pg (ref 26.0–34.0)
MCHC: 33.5 g/dL (ref 30.0–36.0)
MCV: 93 fL (ref 80.0–100.0)
Platelets: 49 10*3/uL — ABNORMAL LOW (ref 150–400)
RBC: 2.44 MIL/uL — ABNORMAL LOW (ref 3.87–5.11)
RDW: 19.3 % — ABNORMAL HIGH (ref 11.5–15.5)
WBC: 0.2 10*3/uL — CL (ref 4.0–10.5)
nRBC: 0 % (ref 0.0–0.2)

## 2021-04-21 LAB — COMPREHENSIVE METABOLIC PANEL
ALT: 21 U/L (ref 0–44)
AST: 16 U/L (ref 15–41)
Albumin: 2.8 g/dL — ABNORMAL LOW (ref 3.5–5.0)
Alkaline Phosphatase: 55 U/L (ref 38–126)
Anion gap: 11 (ref 5–15)
BUN: 22 mg/dL — ABNORMAL HIGH (ref 6–20)
CO2: 20 mmol/L — ABNORMAL LOW (ref 22–32)
Calcium: 7.7 mg/dL — ABNORMAL LOW (ref 8.9–10.3)
Chloride: 101 mmol/L (ref 98–111)
Creatinine, Ser: 1.52 mg/dL — ABNORMAL HIGH (ref 0.44–1.00)
GFR, Estimated: 40 mL/min — ABNORMAL LOW (ref >60)
Glucose, Bld: 118 mg/dL — ABNORMAL HIGH (ref 70–99)
Potassium: 3.5 mmol/L (ref 3.5–5.1)
Sodium: 132 mmol/L — ABNORMAL LOW (ref 135–145)
Total Bilirubin: 0.9 mg/dL (ref 0.3–1.2)
Total Protein: 5.8 g/dL — ABNORMAL LOW (ref 6.5–8.1)

## 2021-04-21 LAB — C DIFFICILE QUICK SCREEN W PCR REFLEX
C Diff antigen: NEGATIVE
C Diff interpretation: NOT DETECTED
C Diff toxin: NEGATIVE

## 2021-04-21 LAB — GLUCOSE, CAPILLARY
Glucose-Capillary: 130 mg/dL — ABNORMAL HIGH (ref 70–99)
Glucose-Capillary: 113 mg/dL — ABNORMAL HIGH (ref 70–99)

## 2021-04-21 LAB — HIV ANTIBODY (ROUTINE TESTING W REFLEX): HIV Screen 4th Generation wRfx: NONREACTIVE

## 2021-04-21 MED ORDER — BOOST / RESOURCE BREEZE PO LIQD CUSTOM
1.0000 | Freq: Two times a day (BID) | ORAL | Status: DC
  Administered 2021-04-22 – 2021-04-25 (×6): 1 via ORAL

## 2021-04-21 MED ORDER — LOPERAMIDE HCL 2 MG PO CAPS
2.0000 mg | ORAL_CAPSULE | ORAL | Status: DC | PRN
  Administered 2021-04-21 – 2021-04-24 (×6): 2 mg via ORAL
  Filled 2021-04-21 (×6): qty 1

## 2021-04-21 MED ORDER — ACETAMINOPHEN 325 MG PO TABS
650.0000 mg | ORAL_TABLET | Freq: Four times a day (QID) | ORAL | Status: DC | PRN
  Administered 2021-04-21 – 2021-04-22 (×3): 650 mg via ORAL
  Filled 2021-04-21 (×3): qty 2

## 2021-04-21 MED ORDER — CHLORHEXIDINE GLUCONATE CLOTH 2 % EX PADS
6.0000 | MEDICATED_PAD | Freq: Every day | CUTANEOUS | Status: DC
  Administered 2021-04-21 – 2021-04-25 (×5): 6 via TOPICAL

## 2021-04-21 NOTE — Plan of Care (Signed)

## 2021-04-21 NOTE — Progress Notes (Signed)
   04/21/21 0118  Assess: MEWS Score  Temp (!) 102.5 F (39.2 C)  BP 112/75  Pulse Rate (!) 118  Resp 20  SpO2 95 %  O2 Device Room Air  Assess: MEWS Score  MEWS Temp 2  MEWS Systolic 0  MEWS Pulse 2  MEWS RR 0  MEWS LOC 0  MEWS Score 4  MEWS Score Color Red  Assess: if the MEWS score is Yellow or Red  Were vital signs taken at a resting state? Yes  Focused Assessment No change from prior assessment  Does the patient meet 2 or more of the SIRS criteria? No  MEWS guidelines implemented *See Row Information* Yes  Treat  MEWS Interventions Administered prn meds/treatments  Pain Scale 0-10  Pain Score 0  Take Vital Signs  Increase Vital Sign Frequency  Red: Q 1hr X 4 then Q 4hr X 4, if remains red, continue Q 4hrs  Escalate  MEWS: Escalate Red: discuss with charge nurse/RN and provider, consider discussing with RRT  Notify: Charge Nurse/RN  Name of Charge Nurse/RN Notified Jayden, RN  Date Charge Nurse/RN Notified 04/21/21  Time Charge Nurse/RN Notified 0130  Notify: Provider  Provider Name/Title Olena Heckle, NP  Date Provider Notified 04/21/21  Time Provider Notified 0130  Notification Type Page  Notification Reason Other (Comment) (Fever, elevated HR)  Provider response See new orders  Document  Patient Outcome Other (Comment) (Tylenol administered)  Progress note created (see row info) Yes  Assess: SIRS CRITERIA  SIRS Temperature  1  SIRS Pulse 1  SIRS Respirations  0  SIRS WBC 0  SIRS Score Sum  2  Patient spiked a temp to 39.2 and has a heart rate of 118. Provider notified and order received for tylenol for fever. Patient is having chills and would like to keep her blankets. Breathing is even and unlabored. Enteric precautions initiated, stool sample needed to r/o c-diff.

## 2021-04-21 NOTE — Progress Notes (Signed)
PROGRESS NOTE  Leslie Wright [id number] DOB: 26-Mar-1966 DOA: 04/20/2021 PCP: Fanny Bien, MD  HPI/Recap of past 54 hours: 55 year old female with past medical history of breast cancer currently on chemotherapy, hypertension, stage IIIa chronic kidney disease and prediabetes admitted on 7/21 for neutropenic fever after 1 day of chills and low-grade temperatures and following up in her oncologist office.  Only other complaint was a diarrhea.  Started on IV antibiotics and admitted to the hospitalist service.  Work-up to date negative.  C. difficile culture, COVID, urinalysis all negative.  Chest x-ray unremarkable.  Patient continues to spike fevers with l temperature of 102.9 this afternoon at 5:45 PM.  Overall, patient herself says that she is feeling better from the previous day.  Just fatigued and occasional diarrhea.  Denies any shortness of breath, wounds or other complaints.  Assessment/Plan: Principal Problem:   Neutropenic fever ([employer]) with pancytopenia: Monitoring hemoglobin and platelets.  No evidence of bleeding.  Continues to be neutropenic.  Oncology following.  Work-up for infection negative to date.  Continue cefepime. Active Problems:   Essential hypertension: Blood pressure stable.    Morbid obesity with BMI of 40.0-44.9, adult [facility]): Meets criteria BMI greater than 40.    Prediabetes: Monitoring blood sugars.  Stage IIIa chronic kidney disease: Appears to be at baseline    Malignant neoplasm of overlapping sites of left breast in female, estrogen receptor negative ([place]): Following with oncology chemotherapy.    Hepatic steatosis: Transaminases stable.   Code Status: Full code  Family Communication: Left message for brother  Disposition Plan: Continue in hospital until fevers and neutropenia have resolved, work-up complete    Consultants: Oncology  Procedures: None  Antimicrobials: IV Rocephin x1 in oncology office prior to coming in IV  vancomycin 7/21 IV cefepime 7/21-present  DVT prophylaxis: SCDs  Level of care: Med-Surg   Objective: Vitals:   04/21/21 1306 04/21/21 1747  BP: 112/78 120/73  Pulse: (!) 108 (!) 109  Resp: 19 20  Temp: 99.6 F (37.6 C) (!) 102.9 F (39.4 C)  SpO2: 92% 96%    Intake/Output Summary (Last 24 hours) at 04/21/2021 1855 Last data filed at 04/21/2021 1800 Gross per 24 hour  Intake 1859.8 ml  Output --  Net 1859.8 ml   Filed Weights   04/20/21 1745  Weight: 135.2 kg   Body mass index is 46.67 kg/m.  Exam:  General: Alert and oriented x3, no acute distress HEENT: Normocephalic atraumatic, mucous membranes slightly dry Cardiovascular: Regular rate and rhythm, S1-S2, borderline tachycardia Respiratory: Decreased breath sounds throughout secondary to body habitus otherwise clear Abdomen: Soft, nontender, nondistended, positive bowel sounds Musculoskeletal: No clubbing or cyanosis or edema Skin: No skin breaks, wounds, tears or lesions Psychiatry: Appropriate, no evidence of psychoses Neurology: No focal deficits   Data Reviewed: CBC: Recent Labs  Lab 04/20/21 1459 04/21/21 0456  WBC 0.2* 0.2*  NEUTROABS 0.0*  --   HGB 8.9* 7.6*  HCT 25.9* 22.7*  MCV 90.6 93.0  PLT 58* 49*   Basic Metabolic Panel: Recent Labs  Lab 04/20/21 1459 04/21/21 0456  NA 132* 132*  K 3.6 3.5  CL 99 101  CO2 23 20*  GLUCOSE 112* 118*  BUN 18 22*  CREATININE 1.20* 1.52*  CALCIUM 8.3* 7.7*   GFR: Estimated Creatinine Clearance: 59.4 mL/min (A) (by C-G formula based on SCr of 1.52 mg/dL (H)). Liver Function Tests: Recent Labs  Lab 04/20/21 1459 04/21/21 0456  AST 14* 16  ALT 22 21  ALKPHOS 57 55  BILITOT 1.2 0.9  PROT 6.4* 5.8*  ALBUMIN 3.4* 2.8*   No results for input(s): LIPASE, AMYLASE in the last 168 hours. No results for input(s): AMMONIA in the last 168 hours. Coagulation Profile: No results for input(s): INR, PROTIME in the last 168 hours. Cardiac Enzymes: No  results for input(s): CKTOTAL, CKMB, CKMBINDEX, TROPONINI in the last 168 hours. BNP (last 3 results) No results for input(s): PROBNP in the last 8760 hours. HbA1C: Recent Labs    04/20/21 2107  HGBA1C 6.7*   CBG: Recent Labs  Lab 04/20/21 2121 04/21/21 0758 04/21/21 1138  GLUCAP 101* 130* 113*   Lipid Profile: No results for input(s): CHOL, HDL, LDLCALC, TRIG, CHOLHDL, LDLDIRECT in the last 72 hours. Thyroid Function Tests: No results for input(s): TSH, T4TOTAL, FREET4, T3FREE, THYROIDAB in the last 72 hours. Anemia Panel: No results for input(s): VITAMINB12, FOLATE, FERRITIN, TIBC, IRON, RETICCTPCT in the last 72 hours. Urine analysis:    Component Value Date/Time   COLORURINE YELLOW 04/20/2021 1505   APPEARANCEUR HAZY (A) 04/20/2021 1505   LABSPEC 1.015 04/20/2021 1505   PHURINE 5.0 04/20/2021 1505   GLUCOSEU NEGATIVE 04/20/2021 1505   HGBUR MODERATE (A) 04/20/2021 1505   BILIRUBINUR NEGATIVE 04/20/2021 1505   KETONESUR NEGATIVE 04/20/2021 1505   PROTEINUR NEGATIVE 04/20/2021 1505   UROBILINOGEN 0.2 10/23/2010 2031   NITRITE NEGATIVE 04/20/2021 1505   LEUKOCYTESUR NEGATIVE 04/20/2021 1505   Sepsis Labs: '@LABRCNTIP'$ (procalcitonin:4,lacticidven:4)  ) Recent Results (from the past 240 hour(s))  Culture, Blood     Status: None (Preliminary result)   Collection Time: 04/20/21  2:59 PM   Specimen: BLOOD  Result Value Ref Range Status   Specimen Description   Final    BLOOD Performed at [facility], [place] [address], [place], [place] [address]    Special Requests   Final    PORTA CATH Performed at [facility], [place] [address], [place], [place] [address]    Culture   Final    NO GROWTH < 24 HOURS Performed at [place] [address], [place], [place] [address]    Report Status PENDING  Incomplete  Culture, Blood     Status: None (Preliminary result)   Collection Time: 04/20/21  3:45 PM   Specimen:  BLOOD RIGHT ARM  Result Value Ref Range Status   Specimen Description BLOOD RIGHT ARM  Final   Special Requests   Final    BOTTLES DRAWN AEROBIC AND ANAEROBIC Blood Culture results may not be optimal due to an excessive volume of blood received in culture bottles   Culture   Final    NO GROWTH < 12 HOURS Performed at [facility], [place] [address] [place], [place] [address]    Report Status PENDING  Incomplete  Resp Panel by RT-PCR (Flu A&B, Covid) Nasopharyngeal Swab     Status: None   Collection Time: 04/20/21  5:55 PM   Specimen: Nasopharyngeal Swab; Nasopharyngeal(NP) swabs in vial transport medium  Result Value Ref Range Status   SARS Coronavirus 2 by RT PCR NEGATIVE NEGATIVE Final    Comment: (NOTE) SARS-CoV-2 target nucleic acids are NOT DETECTED.  The SARS-CoV-2 RNA is generally detectable in upper respiratory specimens during the acute phase of infection. The lowest concentration of SARS-CoV-2 viral copies this assay can detect is 138 copies/mL. A negative result does not preclude SARS-Cov-2 infection and should not be used as the sole basis for treatment or  other patient management decisions. A negative result may occur with  improper specimen collection/handling, submission of specimen other than nasopharyngeal swab, presence of viral mutation(s) within the areas targeted by this assay, and inadequate number of viral copies(<138 copies/mL). A negative result must be combined with clinical observations, patient history, and epidemiological information. The expected result is Negative.  Fact Sheet for Patients:  EntrepreneurPulse.com.au  Fact Sheet for Healthcare Providers:  IncredibleEmployment.be  This test is no t yet approved or cleared by the [place] FDA and  has been authorized for detection and/or diagnosis of SARS-CoV-2 by FDA under an Emergency Use Authorization (EUA). This EUA will remain  in effect (meaning this  test can be used) for the duration of the COVID-19 declaration under Section 564(b)(1) of the Act, 21 U.S.C.section 360bbb-3(b)(1), unless the authorization is terminated  or revoked sooner.       Influenza A by PCR NEGATIVE NEGATIVE Final   Influenza B by PCR NEGATIVE NEGATIVE Final    Comment: (NOTE) The Xpert Xpress SARS-CoV-2/FLU/RSV plus assay is intended as an aid in the diagnosis of influenza from Nasopharyngeal swab specimens and should not be used as a sole basis for treatment. Nasal washings and aspirates are unacceptable for Xpert Xpress SARS-CoV-2/FLU/RSV testing.  Fact Sheet for Patients: EntrepreneurPulse.com.au  Fact Sheet for Healthcare Providers: IncredibleEmployment.be  This test is not yet approved or cleared by the [place] FDA and has been authorized for detection and/or diagnosis of SARS-CoV-2 by FDA under an Emergency Use Authorization (EUA). This EUA will remain in effect (meaning this test can be used) for the duration of the COVID-19 declaration under Section 564(b)(1) of the Act, 21 U.S.C. section 360bbb-3(b)(1), unless the authorization is terminated or revoked.  Performed at [facility], [place] [address], [place], [place] [address]   Culture, blood (x 2)     Status: None (Preliminary result)   Collection Time: 04/20/21  9:07 PM   Specimen: BLOOD LEFT FOREARM  Result Value Ref Range Status   Specimen Description   Final    BLOOD LEFT FOREARM Performed at [facility], [place] [address], [place], [place] [address]    Special Requests   Final    BOTTLES DRAWN AEROBIC ONLY Blood Culture adequate volume Performed at [place] [address], [place], [place] [address]    Culture   Final    NO GROWTH < 12 HOURS Performed at [place] [address] [place], [place] [address]    Report Status PENDING  Incomplete  Culture, blood (x 2)     Status: None  (Preliminary result)   Collection Time: 04/20/21  9:07 PM   Specimen: BLOOD RIGHT FOREARM  Result Value Ref Range Status   Specimen Description   Final    BLOOD RIGHT FOREARM Performed at [facility], [place] [address] [place], [place] [address]    Special Requests   Final    BOTTLES DRAWN AEROBIC ONLY Blood Culture adequate volume Performed at [place] [address], [place], [place] [address]    Culture   Final    NO GROWTH < 12 HOURS Performed at [place] [address] [place], [place] [address]    Report Status PENDING  Incomplete  C Difficile Quick Screen w PCR reflex     Status: None   Collection Time: 04/21/21  4:55 AM   Specimen: Stool  Result Value Ref Range Status  C Diff antigen NEGATIVE NEGATIVE Final   C Diff toxin NEGATIVE NEGATIVE Final   C Diff interpretation No C. difficile detected.  Final    Comment: Performed at [facility], [place] [address] [place], [place] [address]      Studies: No results found.  Scheduled Meds:  aspirin EC  81 mg Oral Daily   Chlorhexidine Gluconate Cloth  6 each Topical Daily   cholecalciferol  2,000 Units Oral Daily   [START ON 04/22/2021] feeding supplement  1 Container Oral BID BM   folic acid  1 mg Oral Daily   insulin aspart  0-15 Units Subcutaneous TID WC   insulin aspart  0-5 Units Subcutaneous QHS   losartan  100 mg Oral Daily   multivitamin with minerals  1 tablet Oral Daily   pantoprazole  40 mg Oral Daily   rosuvastatin  10 mg Oral QHS    Continuous Infusions:  sodium chloride 75 mL/hr at 04/21/21 1800   ceFEPime (MAXIPIME) IV 2 g (04/21/21 1800)     LOS: 1 day     Annita Brod, MD [facility]   04/21/2021, 6:55 PM

## 2021-04-21 NOTE — Progress Notes (Addendum)
Initial Nutrition Assessment  DOCUMENTATION CODES:   Morbid obesity  INTERVENTION:   -Boost Breeze po BID, each supplement provides 250 kcal and 9 grams of protein  NUTRITION DIAGNOSIS:   Increased nutrient needs related to cancer and cancer related treatments as evidenced by estimated needs.  GOAL:   Patient will meet greater than or equal to 90% of their needs  MONITOR:   PO intake, Supplement acceptance, Labs, Weight trends, I & O's  REASON FOR ASSESSMENT:   Malnutrition Screening Tool    ASSESSMENT:   55 y.o. female with a known history of breast cancer currently on chemotherapy, hypertension, hyperlipidemia,  CKD, GERD, impaired glucose tolerance presents to the emergency department for evaluation of fevers and chills.  Patient was in a usual state of health until 1 day prior to admission when she developed chills, had a low-grade temperature at the oncologist office  Patient with poor appetite PTA following chemotherapy on 7/16. Pt now being checked for c.diff given diarrhea (negative as of this morning). Did consume 100% of breakfast this morning of eggs, toast and fruit. Will order protein supplements for additional kcals and protein.  No significant weight changes per weight records.  Medications: Vitamin D, Folic acid, Multivitamin with minerals daily  Labs reviewed:  CBGs: 101-130 Low Na  NUTRITION - FOCUSED PHYSICAL EXAM:  No depletions noted.  Diet Order:   Diet Order             Diet heart healthy/carb modified Room service appropriate? Yes; Fluid consistency: Thin  Diet effective now                   EDUCATION NEEDS:   No education needs have been identified at this time  Skin:  Skin Assessment: Reviewed RN Assessment  Last BM:  7/22 -type 7  Height:   Ht Readings from Last 1 Encounters:  04/20/21 '5\' 7"'$  (1.702 m)    Weight:   Wt Readings from Last 1 Encounters:  04/20/21 135.2 kg    BMI:  Body mass index is 46.67  kg/m.  Estimated Nutritional Needs:   Kcal:  2000-2200  Protein:  90-105g  Fluid:  2.2L/day  Clayton Bibles, MS, RD, LDN Inpatient Clinical Dietitian Contact information available via Amion

## 2021-04-22 DIAGNOSIS — D709 Neutropenia, unspecified: Secondary | ICD-10-CM | POA: Diagnosis not present

## 2021-04-22 DIAGNOSIS — R5081 Fever presenting with conditions classified elsewhere: Secondary | ICD-10-CM | POA: Diagnosis not present

## 2021-04-22 LAB — HEMOGLOBIN AND HEMATOCRIT, BLOOD
HCT: 20.6 % — ABNORMAL LOW (ref 36.0–46.0)
Hemoglobin: 7 g/dL — ABNORMAL LOW (ref 12.0–15.0)

## 2021-04-22 LAB — URINE CULTURE

## 2021-04-22 LAB — CBC WITH DIFFERENTIAL/PLATELET
Abs Immature Granulocytes: 0.02 10*3/uL (ref 0.00–0.07)
Basophils Absolute: 0 10*3/uL (ref 0.0–0.1)
Basophils Relative: 0 %
Eosinophils Absolute: 0 10*3/uL (ref 0.0–0.5)
Eosinophils Relative: 0 %
HCT: 20.1 % — ABNORMAL LOW (ref 36.0–46.0)
Hemoglobin: 6.7 g/dL — CL (ref 12.0–15.0)
Immature Granulocytes: 5 %
Lymphocytes Relative: 25 %
Lymphs Abs: 0.1 10*3/uL — ABNORMAL LOW (ref 0.7–4.0)
MCH: 30.7 pg (ref 26.0–34.0)
MCHC: 33.3 g/dL (ref 30.0–36.0)
MCV: 92.2 fL (ref 80.0–100.0)
Monocytes Absolute: 0.1 10*3/uL (ref 0.1–1.0)
Monocytes Relative: 23 %
Neutro Abs: 0.2 10*3/uL — CL (ref 1.7–7.7)
Neutrophils Relative %: 47 %
Platelets: 45 10*3/uL — ABNORMAL LOW (ref 150–400)
RBC: 2.18 MIL/uL — ABNORMAL LOW (ref 3.87–5.11)
RDW: 19.6 % — ABNORMAL HIGH (ref 11.5–15.5)
WBC: 0.4 10*3/uL — CL (ref 4.0–10.5)
nRBC: 0 % (ref 0.0–0.2)

## 2021-04-22 LAB — BASIC METABOLIC PANEL
Anion gap: 7 (ref 5–15)
BUN: 20 mg/dL (ref 6–20)
CO2: 20 mmol/L — ABNORMAL LOW (ref 22–32)
Calcium: 7.4 mg/dL — ABNORMAL LOW (ref 8.9–10.3)
Chloride: 103 mmol/L (ref 98–111)
Creatinine, Ser: 1.42 mg/dL — ABNORMAL HIGH (ref 0.44–1.00)
GFR, Estimated: 43 mL/min — ABNORMAL LOW (ref >60)
Glucose, Bld: 119 mg/dL — ABNORMAL HIGH (ref 70–99)
Potassium: 3 mmol/L — ABNORMAL LOW (ref 3.5–5.1)
Sodium: 130 mmol/L — ABNORMAL LOW (ref 135–145)

## 2021-04-22 LAB — PREPARE RBC (CROSSMATCH)

## 2021-04-22 LAB — ABO/RH: ABO/RH(D): B POS

## 2021-04-22 MED ORDER — GUAIFENESIN-DM 100-10 MG/5ML PO SYRP
5.0000 mL | ORAL_SOLUTION | ORAL | Status: DC | PRN
  Administered 2021-04-23 – 2021-04-25 (×6): 5 mL via ORAL
  Filled 2021-04-22 (×6): qty 10

## 2021-04-22 MED ORDER — SODIUM CHLORIDE 0.9% IV SOLUTION: Freq: Once | INTRAVENOUS | Status: AC

## 2021-04-22 MED ORDER — POTASSIUM CHLORIDE CRYS ER 20 MEQ PO TBCR
40.0000 meq | EXTENDED_RELEASE_TABLET | Freq: Once | ORAL | Status: AC
  Administered 2021-04-22: 40 meq via ORAL
  Filled 2021-04-22: qty 2

## 2021-04-22 NOTE — Progress Notes (Signed)
Temp 102.2 15 minutes after blood transfusion started. All remained vital signs are stable. Pt is asymptomatic and not in any acute distress. MD Notified. No new orders. Will continue to monitor for changes.

## 2021-04-22 NOTE — Progress Notes (Signed)
PROGRESS NOTE    CAY MEANOR   [id number]  DOB: Aug 04, 1966  PCP: Fanny Bien, MD    DOA: 04/20/2021 LOS: 2   Assessment & Plan   Principal Problem:   Neutropenic fever ([place]) Active Problems:   Essential hypertension   Morbid obesity with BMI of 40.0-44.9, adult ([place])   Prediabetes   Malignant neoplasm of overlapping sites of left breast in female, estrogen receptor negative (HCC)   Hepatic steatosis   CKD (chronic kidney disease) stage 3, GFR 30-59 ml/min (HCC)   Neutropenic fever with pancytopenia due to chemotherapy. -- Monitoring hemoglobin and platelets.  No evidence of bleeding.   --Transfuse 1 unit pRBC's today for Hbg 6.7 --Remains neutropenic.   --Oncology following.   --Continue Cefepime Work-up for infection negative to date.    Malignant neoplasm of overlapping sites of left breast in female, estrogen receptor negative -  Following with oncology, on chemotherapy.  Essential hypertension: chronic, stable.   Morbid obesity with BMI of 40.0-44.9, adult [facility]): Meets criteria BMI greater than 40.   Prediabetes: monitor fasting AM glucose   Stage IIIa chronic kidney disease: Appears to be at baseline   Hepatic steatosis: Transaminases stable.  Patient BMI: Body mass index is 46.67 kg/m.   DVT prophylaxis: SCDs Start: 04/20/21 1942   Diet:  Diet Orders (From admission, onward)     Start     Ordered   04/20/21 1942  Diet heart healthy/carb modified Room service appropriate? Yes; Fluid consistency: Thin  Diet effective now       Question Answer Comment  Diet-HS Snack? Nothing   Room service appropriate? Yes   Fluid consistency: Thin      04/20/21 1941              Code Status: Full Code   Brief Narrative / Hospital Course to Date:   "55 year old female with past medical history of breast cancer currently on chemotherapy, hypertension, stage IIIa chronic kidney disease and prediabetes admitted on 7/21 for neutropenic fever after 1  day of chills and low-grade temperatures and following up in her oncologist office.  Only other complaint was a diarrhea.  Started on IV antibiotics and admitted to the hospitalist service.   Work-up to date negative.  C. difficile culture, COVID, urinalysis all negative.  Chest x-ray unremarkable.  Patient continues to spike fevers..."  Today with 100.5, later over 102 again.  7/23 pt's Hbg 6.7 this morning.  RBC transfusion  Subjective 04/22/21    Pt seen this AM, reports just being very tired.  Says sleep medicine they gave last night worked well.  Having some diarrhea but no other symptoms aside from fatigue.   Disposition Plan & Communication   Status is: Inpatient  Remains inpatient appropriate because:Inpatient level of care appropriate due to severity of illness  Dispo: The patient is from: Home              Anticipated d/c is to: Home              Patient currently is not medically stable to d/c.   Difficult to place patient No    Consults, Procedures, Significant Events   Consultants:  Oncology  Procedures:  Transfusion 1 unit pRBC's 7/23  Antimicrobials:  Anti-infectives (From admission, onward)    Start     Dose/Rate Route Frequency Ordered Stop   04/21/21 0300  ceFEPIme (MAXIPIME) 2 g in sodium chloride 0.9 % 100 mL IVPB  2 g 200 mL/hr over 30 Minutes Intravenous Every 8 hours 04/20/21 1941     04/20/21 1830  vancomycin (VANCOREADY) IVPB 2000 mg/400 mL        2,000 mg 200 mL/hr over 120 Minutes Intravenous  Once 04/20/21 1806 04/20/21 1940   04/20/21 1800  vancomycin (VANCOCIN) IVPB 1000 mg/200 mL premix  Status:  Discontinued        1,000 mg 200 mL/hr over 60 Minutes Intravenous  Once 04/20/21 1745 04/20/21 1806   04/20/21 1800  ceFEPIme (MAXIPIME) 2 g in sodium chloride 0.9 % 100 mL IVPB        2 g 200 mL/hr over 30 Minutes Intravenous  Once 04/20/21 1758 04/20/21 1940         Micro    Objective   Vitals:   04/22/21 1304 04/22/21 1455  04/22/21 1516 04/22/21 1642  BP: 99/67 95/67 101/60 (!) 99/53  Pulse: 92 99 100 95  Resp: '19 18 16 20  '$ Temp: 99.8 F (37.7 C) 100 F (37.8 C) (!) 102.2 F (39 C) 99.2 F (37.3 C)  TempSrc: Oral Oral Oral Oral  SpO2: 91% 93% 94% 96%  Weight:      Height:        Intake/Output Summary (Last 24 hours) at 04/22/2021 1655 Last data filed at 04/22/2021 1100 Gross per 24 hour  Intake 389.9 ml  Output --  Net 389.9 ml   Filed Weights   04/20/21 1745  Weight: 135.2 kg    Physical Exam:  General exam: awake, alert, no acute distress, obese, mildly ill-appearing Respiratory system: CTAB but diminished, no wheezes or rhonchi, normal respiratory effort. Cardiovascular system: normal S1/S2, RRR Central nervous system: A&O x3. no gross focal neurologic deficits, normal speech Extremities: moves all, no edema, normal tone Skin: dry, intact, normal temperature Psychiatry: normal mood, congruent affect, judgement and insight appear normal  Labs   Data Reviewed: I have personally reviewed following labs and imaging studies  CBC: Recent Labs  Lab 04/20/21 1459 04/21/21 0456 04/22/21 0505  WBC 0.2* 0.2* 0.4*  NEUTROABS 0.0*  --  0.2*  HGB 8.9* 7.6* 6.7*  HCT 25.9* 22.7* 20.1*  MCV 90.6 93.0 92.2  PLT 58* 49* 45*   Basic Metabolic Panel: Recent Labs  Lab 04/20/21 1459 04/21/21 0456 04/22/21 0505  NA 132* 132* 130*  K 3.6 3.5 3.0*  CL 99 101 103  CO2 23 20* 20*  GLUCOSE 112* 118* 119*  BUN 18 22* 20  CREATININE 1.20* 1.52* 1.42*  CALCIUM 8.3* 7.7* 7.4*   GFR: Estimated Creatinine Clearance: 63.6 mL/min (A) (by C-G formula based on SCr of 1.42 mg/dL (H)). Liver Function Tests: Recent Labs  Lab 04/20/21 1459 04/21/21 0456  AST 14* 16  ALT 22 21  ALKPHOS 57 55  BILITOT 1.2 0.9  PROT 6.4* 5.8*  ALBUMIN 3.4* 2.8*   No results for input(s): LIPASE, AMYLASE in the last 168 hours. No results for input(s): AMMONIA in the last 168 hours. Coagulation Profile: No  results for input(s): INR, PROTIME in the last 168 hours. Cardiac Enzymes: No results for input(s): CKTOTAL, CKMB, CKMBINDEX, TROPONINI in the last 168 hours. BNP (last 3 results) No results for input(s): PROBNP in the last 8760 hours. HbA1C: Recent Labs    04/20/21 2107  HGBA1C 6.7*   CBG: Recent Labs  Lab 04/20/21 2121 04/21/21 0758 04/21/21 1138  GLUCAP 101* 130* 113*   Lipid Profile: No results for input(s): CHOL, HDL, LDLCALC, TRIG, CHOLHDL, LDLDIRECT in  the last 72 hours. Thyroid Function Tests: No results for input(s): TSH, T4TOTAL, FREET4, T3FREE, THYROIDAB in the last 72 hours. Anemia Panel: No results for input(s): VITAMINB12, FOLATE, FERRITIN, TIBC, IRON, RETICCTPCT in the last 72 hours. Sepsis Labs: No results for input(s): PROCALCITON, LATICACIDVEN in the last 168 hours.  Recent Results (from the past 240 hour(s))  Culture, Blood     Status: None (Preliminary result)   Collection Time: 04/20/21  2:59 PM   Specimen: BLOOD  Result Value Ref Range Status   Specimen Description   Final    BLOOD Performed at [facility], [place] [address], [place], [place] [address]    Special Requests   Final    PORTA CATH Performed at [facility], [place] [address], [place], [place] [address]    Culture   Final    NO GROWTH 2 DAYS Performed at [place] [address], [place], [place] [address]    Report Status PENDING  Incomplete  Urine Culture     Status: Abnormal   Collection Time: 04/20/21  3:05 PM   Specimen: Urine, Clean Catch  Result Value Ref Range Status   Specimen Description   Final    URINE, CLEAN CATCH Performed at [facility], [place] [address] [place], [place] [address]    Special Requests   Final    NONE Performed at [facility], [place] [address], [place], [place] [address]    Culture MULTIPLE SPECIES PRESENT, SUGGEST RECOLLECTION (A)  Final    Report Status 04/22/2021 FINAL  Final  Culture, Blood     Status: None (Preliminary result)   Collection Time: 04/20/21  3:45 PM   Specimen: BLOOD RIGHT ARM  Result Value Ref Range Status   Specimen Description BLOOD RIGHT ARM  Final   Special Requests   Final    BOTTLES DRAWN AEROBIC AND ANAEROBIC Blood Culture results may not be optimal due to an excessive volume of blood received in culture bottles   Culture   Final    NO GROWTH 2 DAYS Performed at [facility], [place] [address], [place], [place] [address]    Report Status PENDING  Incomplete  Resp Panel by RT-PCR (Flu A&B, Covid) Nasopharyngeal Swab     Status: None   Collection Time: 04/20/21  5:55 PM   Specimen: Nasopharyngeal Swab; Nasopharyngeal(NP) swabs in vial transport medium  Result Value Ref Range Status   SARS Coronavirus 2 by RT PCR NEGATIVE NEGATIVE Final    Comment: (NOTE) SARS-CoV-2 target nucleic acids are NOT DETECTED.  The SARS-CoV-2 RNA is generally detectable in upper respiratory specimens during the acute phase of infection. The lowest concentration of SARS-CoV-2 viral copies this assay can detect is 138 copies/mL. A negative result does not preclude SARS-Cov-2 infection and should not be used as the sole basis for treatment or other patient management decisions. A negative result may occur with  improper specimen collection/handling, submission of specimen other than nasopharyngeal swab, presence of viral mutation(s) within the areas targeted by this assay, and inadequate number of viral copies(<138 copies/mL). A negative result must be combined with clinical observations, patient history, and epidemiological information. The expected result is Negative.  Fact Sheet for Patients:  EntrepreneurPulse.com.au  Fact Sheet for Healthcare Providers:  IncredibleEmployment.be  This test is no t yet approved or cleared by the [place] FDA and  has been authorized for  detection and/or diagnosis of SARS-CoV-2  by FDA under an Emergency Use Authorization (EUA). This EUA will remain  in effect (meaning this test can be used) for the duration of the COVID-19 declaration under Section 564(b)(1) of the Act, 21 U.S.C.section 360bbb-3(b)(1), unless the authorization is terminated  or revoked sooner.       Influenza A by PCR NEGATIVE NEGATIVE Final   Influenza B by PCR NEGATIVE NEGATIVE Final    Comment: (NOTE) The Xpert Xpress SARS-CoV-2/FLU/RSV plus assay is intended as an aid in the diagnosis of influenza from Nasopharyngeal swab specimens and should not be used as a sole basis for treatment. Nasal washings and aspirates are unacceptable for Xpert Xpress SARS-CoV-2/FLU/RSV testing.  Fact Sheet for Patients: EntrepreneurPulse.com.au  Fact Sheet for Healthcare Providers: IncredibleEmployment.be  This test is not yet approved or cleared by the [place] FDA and has been authorized for detection and/or diagnosis of SARS-CoV-2 by FDA under an Emergency Use Authorization (EUA). This EUA will remain in effect (meaning this test can be used) for the duration of the COVID-19 declaration under Section 564(b)(1) of the Act, 21 U.S.C. section 360bbb-3(b)(1), unless the authorization is terminated or revoked.  Performed at [facility], [place] [address] [place], [place] [address]   Culture, blood (x 2)     Status: None (Preliminary result)   Collection Time: 04/20/21  9:07 PM   Specimen: BLOOD LEFT FOREARM  Result Value Ref Range Status   Specimen Description   Final    BLOOD LEFT FOREARM Performed at [facility], [place] [address], [place], [place] [address]    Special Requests   Final    BOTTLES DRAWN AEROBIC ONLY Blood Culture adequate volume Performed at [place] [address] [place], [place] [address]    Culture   Final    NO GROWTH 2 DAYS Performed at [place] [address] [place], [place] [address]    Report Status PENDING  Incomplete  Culture, blood (x 2)     Status: None (Preliminary result)   Collection Time: 04/20/21  9:07 PM   Specimen: BLOOD RIGHT FOREARM  Result Value Ref Range Status   Specimen Description   Final    BLOOD RIGHT FOREARM Performed at [facility], [place] [address] [place], [place] [address]    Special Requests   Final    BOTTLES DRAWN AEROBIC ONLY Blood Culture adequate volume Performed at [place] [address], [place], [place] [address]    Culture   Final    NO GROWTH 2 DAYS Performed at [place] [address], [place], [place] [address]    Report Status PENDING  Incomplete  C Difficile Quick Screen w PCR reflex     Status: None   Collection Time: 04/21/21  4:55 AM   Specimen: Stool  Result Value Ref Range Status   C Diff antigen NEGATIVE NEGATIVE Final   C Diff toxin NEGATIVE NEGATIVE Final   C Diff interpretation No C. difficile detected.  Final    Comment: Performed at [facility], [place] [address], [place],  [address]      Imaging Studies   No results found.   Medications   Scheduled Meds:  aspirin EC  81 mg Oral Daily   Chlorhexidine Gluconate Cloth  6 each Topical Daily   cholecalciferol  2,000 Units Oral Daily   feeding supplement  1 Container Oral BID BM   folic acid  1 mg Oral Daily   insulin aspart  0-15 Units Subcutaneous TID WC   insulin aspart  0-5 Units Subcutaneous QHS   losartan  100 mg Oral Daily   multivitamin with minerals  1 tablet Oral Daily   pantoprazole  40 mg Oral Daily   rosuvastatin  10 mg Oral QHS   Continuous Infusions:  sodium chloride 75 mL/hr at 04/21/21 1800   ceFEPime (MAXIPIME) IV 2 g (04/22/21 1115)       LOS: 2 days    Time spent: 30 minutes with >50% spent at bedside and in coordination of care.    Ezekiel Slocumb, DO [facility]  04/22/2021, 4:55 PM       If 7PM-7AM, please contact night-coverage. How to contact the [facility] Attending or Consulting provider [place] or covering provider during after hours [place], for this patient?    Check the care team in [facility] and look for a) attending/consulting [facility] provider listed and b) the [facility] team listed Log into [email or web address] and use [place]'s universal password to access. If you do not have the password, please contact the hospital operator. Locate the [facility] provider you are looking for under [facility] and page to a number that you can be directly reached. If you still have difficulty reaching the provider, please page the [facility] (Director on Call) for the Hospitalists listed on amion for assistance.

## 2021-04-22 NOTE — Progress Notes (Signed)
Made NP Blount aware of the pt's abnormal labs this am, wbc 0.4, Hgb 6.7, no new orders at this time.

## 2021-04-23 ENCOUNTER — Encounter (HOSPITAL_COMMUNITY): Payer: Self-pay | Admitting: Family Medicine

## 2021-04-23 LAB — CBC WITH DIFFERENTIAL/PLATELET
Band Neutrophils: 16 %
Basophils Relative: 0 %
Blasts: NONE SEEN %
Eosinophils Relative: 0 %
HCT: 21.1 % — ABNORMAL LOW (ref 36.0–46.0)
Hemoglobin: 7 g/dL — ABNORMAL LOW (ref 12.0–15.0)
Lymphocytes Relative: 11 %
MCH: 30.6 pg (ref 26.0–34.0)
MCHC: 33.2 g/dL (ref 30.0–36.0)
MCV: 92.1 fL (ref 80.0–100.0)
Metamyelocytes Relative: NONE SEEN %
Monocytes Relative: 11 %
Myelocytes: NONE SEEN %
Neutrophils Relative %: 61 %
Platelets: 60 10*3/uL — ABNORMAL LOW (ref 150–400)
Promyelocytes Relative: NONE SEEN %
RBC Morphology: NORMAL
RBC: 2.29 MIL/uL — ABNORMAL LOW (ref 3.87–5.11)
RDW: 19.4 % — ABNORMAL HIGH (ref 11.5–15.5)
WBC: 2.7 10*3/uL — ABNORMAL LOW (ref 4.0–10.5)
nRBC: 1.9 % — ABNORMAL HIGH (ref 0.0–0.2)
nRBC: NONE SEEN /100 WBC

## 2021-04-23 LAB — BASIC METABOLIC PANEL
Anion gap: 10 (ref 5–15)
BUN: 19 mg/dL (ref 6–20)
CO2: 20 mmol/L — ABNORMAL LOW (ref 22–32)
Calcium: 8.3 mg/dL — ABNORMAL LOW (ref 8.9–10.3)
Chloride: 103 mmol/L (ref 98–111)
Creatinine, Ser: 1.38 mg/dL — ABNORMAL HIGH (ref 0.44–1.00)
GFR, Estimated: 45 mL/min — ABNORMAL LOW (ref >60)
Glucose, Bld: 101 mg/dL — ABNORMAL HIGH (ref 70–99)
Potassium: 3.5 mmol/L (ref 3.5–5.1)
Sodium: 133 mmol/L — ABNORMAL LOW (ref 135–145)

## 2021-04-23 LAB — PREPARE RBC (CROSSMATCH)

## 2021-04-23 LAB — SAVE SMEAR(SSMR), FOR PROVIDER SLIDE REVIEW

## 2021-04-23 LAB — HEMOGLOBIN AND HEMATOCRIT, BLOOD
HCT: 22.7 % — ABNORMAL LOW (ref 36.0–46.0)
Hemoglobin: 7.8 g/dL — ABNORMAL LOW (ref 12.0–15.0)

## 2021-04-23 LAB — MAGNESIUM: Magnesium: 1.8 mg/dL (ref 1.7–2.4)

## 2021-04-23 LAB — LACTATE DEHYDROGENASE: LDH: 283 U/L — ABNORMAL HIGH (ref 98–192)

## 2021-04-23 MED ORDER — SALINE SPRAY 0.65 % NA SOLN
1.0000 | NASAL | Status: DC | PRN
  Administered 2021-04-23 – 2021-04-24 (×2): 1 via NASAL
  Filled 2021-04-23: qty 44

## 2021-04-23 MED ORDER — POTASSIUM CHLORIDE CRYS ER 20 MEQ PO TBCR
40.0000 meq | EXTENDED_RELEASE_TABLET | Freq: Once | ORAL | Status: AC
  Administered 2021-04-23: 40 meq via ORAL
  Filled 2021-04-23: qty 2

## 2021-04-23 MED ORDER — DIPHENHYDRAMINE HCL 25 MG PO CAPS
25.0000 mg | ORAL_CAPSULE | Freq: Every evening | ORAL | Status: DC | PRN
  Administered 2021-04-23 – 2021-04-24 (×2): 25 mg via ORAL
  Filled 2021-04-23 (×2): qty 1

## 2021-04-23 MED ORDER — SODIUM ZIRCONIUM CYCLOSILICATE 10 G PO PACK: 10.0000 g | PACK | Freq: Once | ORAL | Status: DC

## 2021-04-23 MED ORDER — SODIUM CHLORIDE 0.9% IV SOLUTION: Freq: Once | INTRAVENOUS | Status: AC

## 2021-04-23 MED ORDER — DEXTROSE IN LACTATED RINGERS 5 % IV SOLN
INTRAVENOUS | Status: DC
  Administered 2021-04-23: 1000 mL via INTRAVENOUS

## 2021-04-23 NOTE — Progress Notes (Signed)
PROGRESS NOTE    Leslie Wright   [id number]  DOB: 1966-07-30  PCP: Fanny Bien, MD    DOA: 04/20/2021 LOS: 3   Assessment & Plan   Principal Problem:   Neutropenic fever ([place]) Active Problems:   Essential hypertension   Morbid obesity with BMI of 40.0-44.9, adult (HCC)   Prediabetes   Malignant neoplasm of overlapping sites of left breast in female, estrogen receptor negative (HCC)   Hepatic steatosis   CKD (chronic kidney disease) stage 3, GFR 30-59 ml/min (HCC)   Neutropenic fever with pancytopenia due to chemotherapy. -- Monitoring hemoglobin and platelets.  No evidence of bleeding.   --Monitor fever curve --Transfuse 1 unit irradiated pRBC's again today --Check peripheral smear r/o hemolysis, LDH level --WBC improving 0.4 >> 2.7  --Oncology following.   --Continue Cefepime --Stool culture pending given ongoing diarrhea, follow Work-up for infection negative to date.    Malignant neoplasm of overlapping sites of left breast in female, estrogen receptor negative -  Following with oncology, on chemotherapy.  Essential hypertension: chronic, stable.   Morbid obesity with BMI of 40.0-44.9, adult [facility]): Meets criteria BMI greater than 40.   Prediabetes: monitor fasting AM glucose   Stage IIIa chronic kidney disease: Appears to be at baseline   Hepatic steatosis: Transaminases stable.  Patient BMI: Body mass index is 46.67 kg/m.   DVT prophylaxis: SCDs Start: 04/20/21 1942   Diet:  Diet Orders (From admission, onward)     Start     Ordered   04/20/21 1942  Diet heart healthy/carb modified Room service appropriate? Yes; Fluid consistency: Thin  Diet effective now       Question Answer Comment  Diet-HS Snack? Nothing   Room service appropriate? Yes   Fluid consistency: Thin      04/20/21 1941              Code Status: Full Code   Brief Narrative / Hospital Course to Date:   "55 year old female with past medical history of breast cancer  currently on chemotherapy, hypertension, stage IIIa chronic kidney disease and prediabetes admitted on 7/21 for neutropenic fever after 1 day of chills and low-grade temperatures and following up in her oncologist office.  Only other complaint was a diarrhea.  Started on IV antibiotics and admitted to the hospitalist service.   Work-up to date negative.  C. difficile culture, COVID, urinalysis all negative.  Chest x-ray unremarkable.  Patient continues to spike fevers..."    7/23 pt's Hbg 6.7 pRBC transfusion. Febrile 100.5 in AM, later 102 in afternoon. 7/24 Hbg improved only to 7.0, 2nd unit pRBC;s  Subjective 04/23/21    Pt seen this AM, ready to get home.  No fevers or chills since last one yesterday afternoon early during blood transfusion.  She has nasal congestion today, uses saline spray at home, ordered.  Otherwise just reports being tired.  No other acute complaints.    Disposition Plan & Communication   Status is: Inpatient  Remains inpatient appropriate because:Inpatient level of care appropriate due to severity of illness. Anticipate d/c home in 24-48 hours if CBC stable and afebrile.   Dispo: The patient is from: Home              Anticipated d/c is to: Home              Patient currently is not medically stable to d/c.   Difficult to place patient No    Consults, Procedures, Significant Events  Consultants:  Oncology  Procedures:  Transfusion 1 unit pRBC's 7/23  Antimicrobials:  Anti-infectives (From admission, onward)    Start     Dose/Rate Route Frequency Ordered Stop   04/21/21 0300  ceFEPIme (MAXIPIME) 2 g in sodium chloride 0.9 % 100 mL IVPB        2 g 200 mL/hr over 30 Minutes Intravenous Every 8 hours 04/20/21 1941     04/20/21 1830  vancomycin (VANCOREADY) IVPB 2000 mg/400 mL        2,000 mg 200 mL/hr over 120 Minutes Intravenous  Once 04/20/21 1806 04/20/21 1940   04/20/21 1800  vancomycin (VANCOCIN) IVPB 1000 mg/200 mL premix  Status:  Discontinued         1,000 mg 200 mL/hr over 60 Minutes Intravenous  Once 04/20/21 1745 04/20/21 1806   04/20/21 1800  ceFEPIme (MAXIPIME) 2 g in sodium chloride 0.9 % 100 mL IVPB        2 g 200 mL/hr over 30 Minutes Intravenous  Once 04/20/21 1758 04/20/21 1940         Micro    Objective   Vitals:   04/23/21 0100 04/23/21 0534 04/23/21 1530 04/23/21 1621  BP: (!) 145/86 122/70 128/71 (!) 157/97  Pulse: 94 93 90 94  Resp: '18 18 18 16  '$ Temp: 99.5 F (37.5 C) 99.6 F (37.6 C) 98.8 F (37.1 C) 99.4 F (37.4 C)  TempSrc: Oral Oral Oral Oral  SpO2: 91% 91% 98% 94%  Weight:      Height:        Intake/Output Summary (Last 24 hours) at 04/23/2021 1655 Last data filed at 04/23/2021 1606 Gross per 24 hour  Intake 4485.29 ml  Output --  Net 4485.29 ml   Filed Weights   04/20/21 1745  Weight: 135.2 kg    Physical Exam:  General exam: awake, alert, no acute distress, obese Respiratory system: normal respiratory effort, on room air. Cardiovascular system: normal S1/S2, RRR Central nervous system: A&O x3. no gross focal neurologic deficits, normal speech Extremities: moves all, no edema Psychiatry: normal mood, congruent affect, judgement and insight appear normal  Labs   Data Reviewed: I have personally reviewed following labs and imaging studies  CBC: Recent Labs  Lab 04/20/21 1459 04/21/21 0456 04/22/21 0505 04/22/21 2038 04/23/21 0430  WBC 0.2* 0.2* 0.4*  --  2.7*  NEUTROABS 0.0*  --  0.2*  --   --   HGB 8.9* 7.6* 6.7* 7.0* 7.0*  HCT 25.9* 22.7* 20.1* 20.6* 21.1*  MCV 90.6 93.0 92.2  --  92.1  PLT 58* 49* 45*  --  60*   Basic Metabolic Panel: Recent Labs  Lab 04/20/21 1459 04/21/21 0456 04/22/21 0505 04/23/21 0430  NA 132* 132* 130* 133*  K 3.6 3.5 3.0* 3.5  CL 99 101 103 103  CO2 23 20* 20* 20*  GLUCOSE 112* 118* 119* 101*  BUN 18 22* 20 19  CREATININE 1.20* 1.52* 1.42* 1.38*  CALCIUM 8.3* 7.7* 7.4* 8.3*  MG  --   --   --  1.8   GFR: Estimated Creatinine  Clearance: 65.4 mL/min (A) (by C-G formula based on SCr of 1.38 mg/dL (H)). Liver Function Tests: Recent Labs  Lab 04/20/21 1459 04/21/21 0456  AST 14* 16  ALT 22 21  ALKPHOS 57 55  BILITOT 1.2 0.9  PROT 6.4* 5.8*  ALBUMIN 3.4* 2.8*   No results for input(s): LIPASE, AMYLASE in the last 168 hours. No results for input(s): AMMONIA in  the last 168 hours. Coagulation Profile: No results for input(s): INR, PROTIME in the last 168 hours. Cardiac Enzymes: No results for input(s): CKTOTAL, CKMB, CKMBINDEX, TROPONINI in the last 168 hours. BNP (last 3 results) No results for input(s): PROBNP in the last 8760 hours. HbA1C: Recent Labs    04/20/21 2107  HGBA1C 6.7*   CBG: Recent Labs  Lab 04/20/21 2121 04/21/21 0758 04/21/21 1138  GLUCAP 101* 130* 113*   Lipid Profile: No results for input(s): CHOL, HDL, LDLCALC, TRIG, CHOLHDL, LDLDIRECT in the last 72 hours. Thyroid Function Tests: No results for input(s): TSH, T4TOTAL, FREET4, T3FREE, THYROIDAB in the last 72 hours. Anemia Panel: No results for input(s): VITAMINB12, FOLATE, FERRITIN, TIBC, IRON, RETICCTPCT in the last 72 hours. Sepsis Labs: No results for input(s): PROCALCITON, LATICACIDVEN in the last 168 hours.  Recent Results (from the past 240 hour(s))  Culture, Blood     Status: None (Preliminary result)   Collection Time: 04/20/21  2:59 PM   Specimen: BLOOD  Result Value Ref Range Status   Specimen Description   Final    BLOOD Performed at [facility], [place] [address], [place], [place] [address]    Special Requests   Final    PORTA CATH Performed at [facility], [place] [address], [place], [place] [address]    Culture   Final    NO GROWTH 3 DAYS Performed at [facility], [place] [address], [place], [place] [address]    Report Status PENDING  Incomplete  Urine Culture     Status: Abnormal   Collection Time: 04/20/21  3:05 PM   Specimen: Urine, Clean Catch   Result Value Ref Range Status   Specimen Description   Final    URINE, CLEAN CATCH Performed at [facility], [place] [address], [place], [place] [address]    Special Requests   Final    NONE Performed at [facility], [place] [address] [place], [place] [address]    Culture MULTIPLE SPECIES PRESENT, SUGGEST RECOLLECTION (A)  Final   Report Status 04/22/2021 FINAL  Final  Culture, Blood     Status: None (Preliminary result)   Collection Time: 04/20/21  3:45 PM   Specimen: BLOOD RIGHT ARM  Result Value Ref Range Status   Specimen Description BLOOD RIGHT ARM  Final   Special Requests   Final    BOTTLES DRAWN AEROBIC AND ANAEROBIC Blood Culture results may not be optimal due to an excessive volume of blood received in culture bottles   Culture   Final    NO GROWTH 3 DAYS Performed at [facility], [place] [address] [place], [place] [address]    Report Status PENDING  Incomplete  Resp Panel by RT-PCR (Flu A&B, Covid) Nasopharyngeal Swab     Status: None   Collection Time: 04/20/21  5:55 PM   Specimen: Nasopharyngeal Swab; Nasopharyngeal(NP) swabs in vial transport medium  Result Value Ref Range Status   SARS Coronavirus 2 by RT PCR NEGATIVE NEGATIVE Final    Comment: (NOTE) SARS-CoV-2 target nucleic acids are NOT DETECTED.  The SARS-CoV-2 RNA is generally detectable in upper respiratory specimens during the acute phase of infection. The lowest concentration of SARS-CoV-2 viral copies this assay can detect is 138 copies/mL. A negative result does not preclude SARS-Cov-2 infection and should not be used as the sole basis for treatment or other patient management decisions. A negative result may occur with  improper specimen collection/handling, submission of specimen other than nasopharyngeal swab, presence of viral mutation(s) within the areas targeted by this assay, and inadequate number of viral copies(<138 copies/mL). A negative  result must be combined with clinical observations, patient history, and epidemiological information. The expected result is Negative.  Fact Sheet for Patients:  EntrepreneurPulse.com.au  Fact Sheet for Healthcare Providers:  IncredibleEmployment.be  This test is no t yet approved or cleared by the [place] FDA and  has been authorized for detection and/or diagnosis of SARS-CoV-2 by FDA under an Emergency Use Authorization (EUA). This EUA will remain  in effect (meaning this test can be used) for the duration of the COVID-19 declaration under Section 564(b)(1) of the Act, 21 U.S.C.section 360bbb-3(b)(1), unless the authorization is terminated  or revoked sooner.       Influenza A by PCR NEGATIVE NEGATIVE Final   Influenza B by PCR NEGATIVE NEGATIVE Final    Comment: (NOTE) The Xpert Xpress SARS-CoV-2/FLU/RSV plus assay is intended as an aid in the diagnosis of influenza from Nasopharyngeal swab specimens and should not be used as a sole basis for treatment. Nasal washings and aspirates are unacceptable for Xpert Xpress SARS-CoV-2/FLU/RSV testing.  Fact Sheet for Patients: EntrepreneurPulse.com.au  Fact Sheet for Healthcare Providers: IncredibleEmployment.be  This test is not yet approved or cleared by the [place] FDA and has been authorized for detection and/or diagnosis of SARS-CoV-2 by FDA under an Emergency Use Authorization (EUA). This EUA will remain in effect (meaning this test can be used) for the duration of the COVID-19 declaration under Section 564(b)(1) of the Act, 21 U.S.C. section 360bbb-3(b)(1), unless the authorization is terminated or revoked.  Performed at [facility], [place] [address] [place], [place] [address]   Culture, blood (x 2)     Status: None (Preliminary result)   Collection Time: 04/20/21  9:07 PM   Specimen: BLOOD LEFT FOREARM  Result Value  Ref Range Status   Specimen Description   Final    BLOOD LEFT FOREARM Performed at [facility], [place] [address], [place], [place] [address]    Special Requests   Final    BOTTLES DRAWN AEROBIC ONLY Blood Culture adequate volume Performed at [place] [address], [place], [place] [address]    Culture   Final    NO GROWTH 3 DAYS Performed at [facility], [place] [address] [place], [place] [address]    Report Status PENDING  Incomplete  Culture, blood (x 2)     Status: None (Preliminary result)   Collection Time: 04/20/21  9:07 PM   Specimen: BLOOD RIGHT FOREARM  Result Value Ref Range Status   Specimen Description   Final    BLOOD RIGHT FOREARM Performed at [facility], [place] [address] [place], [place] [address]    Special Requests   Final    BOTTLES DRAWN AEROBIC ONLY Blood Culture adequate volume Performed at [place] [address], [place], [place] [address]    Culture   Final    NO GROWTH 3 DAYS Performed at [facility], [place] [address] [place], [place] [address]    Report Status PENDING  Incomplete  Stool culture (children & immunocomp patients)     Status: None (Preliminary result)   Collection Time: 04/21/21  4:55 AM   Specimen: Stool  Result Value Ref Range Status   Salmonella/Shigella Screen PENDING  Incomplete   Campylobacter Culture PENDING  Incomplete  E coli, Shiga toxin Assay Negative Negative Final    Comment: (NOTE) Performed At: [facility] [place], [place] [id number] Rush Farmer MD [id number]   C Difficile Quick Screen w PCR reflex     Status: None   Collection Time: 04/21/21  4:55 AM   Specimen: Stool  Result Value Ref Range Status   C Diff antigen NEGATIVE NEGATIVE Final   C Diff toxin NEGATIVE NEGATIVE Final   C Diff interpretation No C. difficile detected.  Final    Comment: Performed at [facility], [place] [address],  [place], [place] [address]      Imaging Studies   No results found.   Medications   Scheduled Meds:  sodium chloride   Intravenous Once   aspirin EC  81 mg Oral Daily   Chlorhexidine Gluconate Cloth  6 each Topical Daily   cholecalciferol  2,000 Units Oral Daily   feeding supplement  1 Container Oral BID BM   folic acid  1 mg Oral Daily   insulin aspart  0-15 Units Subcutaneous TID WC   insulin aspart  0-5 Units Subcutaneous QHS   losartan  100 mg Oral Daily   multivitamin with minerals  1 tablet Oral Daily   pantoprazole  40 mg Oral Daily   rosuvastatin  10 mg Oral QHS   Continuous Infusions:  sodium chloride Stopped (04/23/21 1518)   ceFEPime (MAXIPIME) IV 2 g (04/23/21 1045)   dextrose 5% lactated ringers 1,000 mL (04/23/21 1517)       LOS: 3 days    Time spent: 30 minutes with >50% spent at bedside and in coordination of care.    Ezekiel Slocumb, DO [facility]  04/23/2021, 4:55 PM      If 7PM-7AM, please contact night-coverage. How to contact the [facility] Attending or Consulting provider [place] or covering provider during after hours [place], for this patient?    Check the care team in [facility] and look for a) attending/consulting TRH provider listed and b) the [facility] team listed Log into [email or web address] and use [place]'s universal password to access. If you do not have the password, please contact the hospital operator. Locate the [facility] provider you are looking for under [facility] and page to a number that you can be directly reached. If you still have difficulty reaching the provider, please page the [facility] (Director on Call) for the Hospitalists listed on amion for assistance.

## 2021-04-24 ENCOUNTER — Inpatient Hospital Stay (HOSPITAL_COMMUNITY): Payer: 59

## 2021-04-24 DIAGNOSIS — R5081 Fever presenting with conditions classified elsewhere: Secondary | ICD-10-CM | POA: Diagnosis not present

## 2021-04-24 DIAGNOSIS — D709 Neutropenia, unspecified: Secondary | ICD-10-CM | POA: Diagnosis not present

## 2021-04-24 LAB — GLUCOSE, CAPILLARY
Glucose-Capillary: 119 mg/dL — ABNORMAL HIGH (ref 70–99)
Glucose-Capillary: 116 mg/dL — ABNORMAL HIGH (ref 70–99)
Glucose-Capillary: 101 mg/dL — ABNORMAL HIGH (ref 70–99)
Glucose-Capillary: 135 mg/dL — ABNORMAL HIGH (ref 70–99)
Glucose-Capillary: 114 mg/dL — ABNORMAL HIGH (ref 70–99)
Glucose-Capillary: 111 mg/dL — ABNORMAL HIGH (ref 70–99)
Glucose-Capillary: 91 mg/dL (ref 70–99)
Glucose-Capillary: 90 mg/dL (ref 70–99)
Glucose-Capillary: 113 mg/dL — ABNORMAL HIGH (ref 70–99)
Glucose-Capillary: 108 mg/dL — ABNORMAL HIGH (ref 70–99)
Glucose-Capillary: 106 mg/dL — ABNORMAL HIGH (ref 70–99)
Glucose-Capillary: 102 mg/dL — ABNORMAL HIGH (ref 70–99)

## 2021-04-24 LAB — CBC WITH DIFFERENTIAL/PLATELET
Abs Immature Granulocytes: 1.2 10*3/uL — ABNORMAL HIGH (ref 0.00–0.07)
Band Neutrophils: 7 %
Basophils Absolute: 0 10*3/uL (ref 0.0–0.1)
Basophils Relative: 0 %
Eosinophils Absolute: 0 10*3/uL (ref 0.0–0.5)
Eosinophils Relative: 0 %
HCT: 21.5 % — ABNORMAL LOW (ref 36.0–46.0)
Hemoglobin: 7.4 g/dL — ABNORMAL LOW (ref 12.0–15.0)
Lymphocytes Relative: 4 %
Lymphs Abs: 0.4 10*3/uL — ABNORMAL LOW (ref 0.7–4.0)
MCH: 31.4 pg (ref 26.0–34.0)
MCHC: 34.4 g/dL (ref 30.0–36.0)
MCV: 91.1 fL (ref 80.0–100.0)
Metamyelocytes Relative: 2 %
Monocytes Absolute: 0.8 10*3/uL (ref 0.1–1.0)
Monocytes Relative: 9 %
Myelocytes: 8 %
Neutro Abs: 6.7 10*3/uL (ref 1.7–7.7)
Neutrophils Relative %: 67 %
Platelets: 103 10*3/uL — ABNORMAL LOW (ref 150–400)
Promyelocytes Relative: 3 %
RBC: 2.36 MIL/uL — ABNORMAL LOW (ref 3.87–5.11)
RDW: 19 % — ABNORMAL HIGH (ref 11.5–15.5)
WBC: 9.1 10*3/uL (ref 4.0–10.5)
nRBC: 1 % — ABNORMAL HIGH (ref 0.0–0.2)

## 2021-04-24 LAB — BASIC METABOLIC PANEL
Anion gap: 9 (ref 5–15)
BUN: 13 mg/dL (ref 6–20)
CO2: 21 mmol/L — ABNORMAL LOW (ref 22–32)
Calcium: 8.1 mg/dL — ABNORMAL LOW (ref 8.9–10.3)
Chloride: 102 mmol/L (ref 98–111)
Creatinine, Ser: 1.05 mg/dL — ABNORMAL HIGH (ref 0.44–1.00)
GFR, Estimated: >60 mL/min (ref >60)
Glucose, Bld: 109 mg/dL — ABNORMAL HIGH (ref 70–99)
Potassium: 3.3 mmol/L — ABNORMAL LOW (ref 3.5–5.1)
Sodium: 132 mmol/L — ABNORMAL LOW (ref 135–145)

## 2021-04-24 LAB — PREPARE RBC (CROSSMATCH)

## 2021-04-24 MED ORDER — POTASSIUM CHLORIDE CRYS ER 20 MEQ PO TBCR
40.0000 meq | EXTENDED_RELEASE_TABLET | Freq: Once | ORAL | Status: AC
  Administered 2021-04-24: 40 meq via ORAL
  Filled 2021-04-24: qty 2

## 2021-04-24 MED ORDER — LEVOFLOXACIN 750 MG PO TABS
750.0000 mg | ORAL_TABLET | Freq: Every day | ORAL | Status: DC
  Administered 2021-04-24: 750 mg via ORAL
  Filled 2021-04-24: qty 1

## 2021-04-24 MED ORDER — SODIUM CHLORIDE 0.9% IV SOLUTION: Freq: Once | INTRAVENOUS | Status: AC

## 2021-04-24 NOTE — Evaluation (Signed)
Occupational Therapy Evaluation Patient Details Name: Leslie Wright MRN: [id number] DOB: 01/01/1966 Today's Date: 04/24/2021    History of Present Illness Pt is a 55 y/o female with malignant neoplasm of overlapping sites of left breast in female, estrogen receptor negative -  Following with oncology, on chemotherapy which began May 2022, admitted on 7/21 for neutropenic fever after 1 day of chills and low-grade temperatures as well as diarrhea.Marland Kitchen PMH includes  L THA, direct anterior approach, obesity, prediabetes, HTN, OSA, and carpal tunnel release.   Clinical Impression   Patient is currently requiring assistance with ADLs including moderate assist with toileting, with LE dressing, and with bathing, and setup assist with seated grooming and UE dressing, all of which is below patient's typical baseline of being Independent.  During this evaluation, patient was limited by impaired activity tolerance with an RPE of 10/10 max effort after doffing and donning one sock while sitting EOB, which has the potential to impact patient's safety and independence during functional mobility, as well as performance for ADLs. [place] "6-clicks" Daily Activity Inpatient Short Form score of 16/24 this session. Patient lives alone with nephews, who are able to provide PRN supervision and assistance.  Patient demonstrates good rehab potential, and should benefit from continued skilled occupational therapy services while in acute care to maximize safety, independence and quality of life at home.   ?   Follow Up Recommendations  No OT follow up    Equipment Recommendations  None recommended by OT    Recommendations for Other Services       Precautions / Restrictions Precautions Precautions: Fall Precaution Comments: Neutropenic Restrictions Weight Bearing Restrictions: No      Mobility Bed Mobility Overal bed mobility: Modified Independent             General bed mobility comments:  Increased time/effort, HOB partially elevated. Patient Response: Cooperative  Transfers Overall transfer level: Needs assistance   Transfers: Sit to/from Stand;Stand Pivot Transfers Sit to Stand: Min guard Stand pivot transfers: Min guard       General transfer comment: No AD used    Balance Overall balance assessment: Mild deficits observed, not formally tested                                         ADL either performed or assessed with clinical judgement   ADL Overall ADL's : Needs assistance/impaired Eating/Feeding: Independent   Grooming: Wash/dry hands;Sitting;Set up   Upper Body Bathing: Set up;Sitting   Lower Body Bathing: Moderate assistance;Sitting/lateral leans;Sit to/from stand Lower Body Bathing Details (indicate cue type and reason): Required Mod As due to fatigue and unable to omplete activity. Upper Body Dressing : Set up;Sitting   Lower Body Dressing: Moderate assistance;Sitting/lateral leans;Sit to/from stand Lower Body Dressing Details (indicate cue type and reason): Pt doffed and donned socks with modifed figure 4 posiiton at EOB. Pt rated the effort of this on RPE as 10/10. Short of breath once completed 1 sock with long rest break needed. Due to fatigue pt then required Moderate assist to doff and don mesh underwear due to hygiene needs. Toilet Transfer: Programmer, systems Details (indicate cue type and reason): To recliner Toileting- Clothing Manipulation and Hygiene: Moderate assistance;Sit to/from stand;Sitting/lateral lean Toileting - Clothing Manipulation Details (indicate cue type and reason): Mod As for posterior peri care due to fatigue after activity.  Functional mobility during ADLs: Supervision/safety;Min guard       Vision Baseline Vision/History: Wears glasses Wears Glasses: Reading only Patient Visual Report: No change from baseline Vision Assessment?: No apparent visual deficits      Perception     Praxis      Pertinent Vitals/Pain Pain Assessment: No/denies pain     Hand Dominance Right   Extremity/Trunk Assessment Upper Extremity Assessment Upper Extremity Assessment: Generalized weakness   Lower Extremity Assessment Lower Extremity Assessment: Defer to PT evaluation       Communication Communication Communication: No difficulties   Cognition Arousal/Alertness: Awake/alert Behavior During Therapy: WFL for tasks assessed/performed Overall Cognitive Status: Within Functional Limits for tasks assessed                                     General Comments       Exercises     Shoulder Instructions      Home Living Family/patient expects to be discharged to:: Private residence Living Arrangements: Alone Available Help at Discharge: Family;Available PRN/intermittently Type of Home: House Home Access: Stairs to enter [employer] of Steps: 8 Entrance Stairs-Rails: Can reach both;Left;Right Home Layout: One level     Bathroom Shower/Tub: Teacher, early years/pre: Standard     Home Equipment: Environmental consultant - 2 wheels;Shower seat;Cane - single point          Prior Functioning/Environment Level of Independence: Independent with assistive device(s)        Comments: Uses cane or RW as needed. Performs all own BADLs and IADLs. Hires out for yard work. 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        OT Problem List: Decreased strength;Cardiopulmonary status limiting activity;Impaired sensation;Decreased activity tolerance;Decreased knowledge of use of DME or AE;Impaired balance (sitting and/or standing)      OT Treatment/Interventions: Self-care/ADL training;Therapeutic activities;Energy conservation;Patient/family education;DME and/or AE instruction;Balance training    OT Goals(Current goals can be found in the care plan section) Acute Rehab OT Goals Patient Stated Goal: To go home OT Goal  Formulation: With patient Time For Goal Achievement: 05/08/21 Potential to Achieve Goals: Good ADL Goals Pt Will Perform Lower Body Dressing: with modified independence;with adaptive equipment;sitting/lateral leans;sit to/from stand Pt Will Transfer to Toilet: with modified independence;ambulating Pt Will Perform Toileting - Clothing Manipulation and hygiene: with modified independence;sitting/lateral leans;sit to/from stand (Will verbalize understanding to adaptive equipment options to assist with conserving energy.) Additional ADL Goal #1: Pt will engage in 10 min combined sitting and standing functional activities without loss of balance, and with RPE report of <6/10 in order to demonstrate improved activity tolerance and balance needed to perform ADLs safely at home. Additional ADL Goal #2: Patient will identify at least 3 energy conservation strategies to employ at home in order to maximize function and quality of life and decrease caregiver burden while preventing exacerbation of symptoms and rehospitalization.  OT Frequency: Min 2X/week   Barriers to D/C:            Co-evaluation              AM-PAC OT "6 Clicks" Daily Activity     Outcome Measure Help from another person eating meals?: None Help from another person taking care of personal grooming?: A Little Help from another person toileting, which includes using toliet, bedpan, or urinal?: A Lot Help from another person bathing (including washing, rinsing, drying)?: A  Lot Help from another person to put on and taking off regular upper body clothing?: A Little Help from another person to put on and taking off regular lower body clothing?: A Lot 6 Click Score: 16   End of Session Equipment Utilized During Treatment: Oxygen Nurse Communication: Mobility status  Activity Tolerance: Patient limited by fatigue Patient left: in chair;with call bell/phone within reach  OT Visit Diagnosis:  (decreased ADLs- Z73.6)                 Time: [id number] OT Time Calculation (min): 34 min Charges:  OT General Charges $OT Visit: 1 Visit OT Evaluation $OT Eval Low Complexity: 1 Low OT Treatments $Self Care/Home Management : 8-22 mins  Anderson [place], [place] Office: [phone] 04/24/2021  Julien Girt 04/24/2021, 3:01 PM

## 2021-04-24 NOTE — Progress Notes (Signed)
Patient would like to wait until am to perform ambulatory 02 screen.  Leslie Wright

## 2021-04-24 NOTE — Progress Notes (Signed)
Pt reports chronic nasal congestion, using saline nasal spray with little relief. Also with dry nonproductive cough that she states started since admission and is becoming more frequent. Lung sounds clear/diminished.   Robitussin given without relief. Pt laying on left side and resps a little labored, sats 87-88% RA. Placed on O2 @ 2Lnc and assisted to reposition in bed which relieved labored breathing. MD and RT notified. Nebulizer given by RT and CXR ordered by MD. Pt reports feeling better after albuterol and no further labored breathing noted. Pt remains on O2 @ 2Lnc.

## 2021-04-24 NOTE — Progress Notes (Addendum)
HEMATOLOGY-ONCOLOGY PROGRESS NOTE  SUBJECTIVE: Leslie Wright reports that she is feeling somewhat better today.  She still has congestion.  She has an intermittent cough but denies having any shortness of breath.  She denies abdominal pain, nausea, vomiting.  She reports that she has been having some loose stools but is taking Imodium for this.  Denies fevers and chills.  Denies bleeding.  Oncology History  Malignant neoplasm of lower-outer quadrant of right breast of female, estrogen receptor positive ([place])  01/20/2021 Cancer Staging   Staging form: Breast, AJCC 8th Edition - Clinical stage from 01/20/2021: Stage IA (cT1c, cN0, cM0, G2, ER+, PR-, HER2-) - Signed by Chauncey Cruel, MD on 01/25/2021  Stage prefix: Initial diagnosis  Histologic grading system: 3 grade system    01/23/2021 Initial Diagnosis   Malignant neoplasm of lower-outer quadrant of right breast of female, estrogen receptor positive ([place])    02/10/2021 -  Chemotherapy    Patient is on Treatment Plan: BREAST PEMBROLIZUMAB + CARBOPLATIN D1 + PACLITAXEL D1,8,15 Q21D X 4 CYCLES / PEMBROLIZUMAB + AC Q21D X 4 CYCLES        Genetic Testing   Negative genetic testing. No pathogenic variants identified on the [facility] Multi-Cancer Panel+RNA. VUS in ATM called c.1132A>G identified. The report date is 02/13/2021.  The Multi-Cancer Panel + RNA offered by [facility] includes sequencing and/or deletion duplication testing of the following 84 genes: AIP, ALK, APC, ATM, AXIN2,BAP1,  BARD1, BLM, BMPR1A, BRCA1, BRCA2, BRIP1, CASR, CDC73, CDH1, CDK4, CDKN1B, CDKN1C, CDKN2A (p14ARF), CDKN2A (p16INK4a), CEBPA, CHEK2, CTNNA1, DICER1, DIS3L2, EGFR (c.2369C>T, p.Thr790Met variant only), EPCAM (Deletion/duplication testing only), FH, FLCN, GATA2, GPC3, GREM1 (Promoter region deletion/duplication testing only), HOXB13 (c.251G>A, p.Gly84Glu), HRAS, KIT, MAX, MEN1, MET, MITF (c.952G>A, p.Glu318Lys variant only), MLH1, MSH2, MSH3, MSH6, MUTYH, NBN, NF1, NF2,  NTHL1, PALB2, PDGFRA, PHOX2B, PMS2, POLD1, POLE, POT1, PRKAR1A, PTCH1, PTEN, RAD50, RAD51C, RAD51D, RB1, RECQL4, RET, RUNX1, SDHAF2, SDHA (sequence changes only), SDHB, SDHC, SDHD, SMAD4, SMARCA4, SMARCB1, SMARCE1, STK11, SUFU, TERC, TERT, TMEM127, TP53, TSC1, TSC2, VHL, WRN and WT1.   Malignant neoplasm of overlapping sites of left breast in female, estrogen receptor negative ([place])  01/20/2021 Cancer Staging   Staging form: Breast, AJCC 8th Edition - Clinical stage from 01/20/2021: Stage IIIB (cT3, cN0, cM0, G3, ER-, PR-, HER2-) - Signed by Chauncey Cruel, MD on 01/25/2021  Histologic grading system: 3 grade system    01/25/2021 Initial Diagnosis   Malignant neoplasm of overlapping sites of left breast in female, estrogen receptor negative ([place])    02/10/2021 -  Chemotherapy    Patient is on Treatment Plan: BREAST PEMBROLIZUMAB + CARBOPLATIN D1 + PACLITAXEL D1,8,15 Q21D X 4 CYCLES / PEMBROLIZUMAB + AC Q21D X 4 CYCLES        Genetic Testing   Negative genetic testing. No pathogenic variants identified on the [facility] Multi-Cancer Panel+RNA. VUS in ATM called c.1132A>G identified. The report date is 02/13/2021.  The Multi-Cancer Panel + RNA offered by [facility] includes sequencing and/or deletion duplication testing of the following 84 genes: AIP, ALK, APC, ATM, AXIN2,BAP1,  BARD1, BLM, BMPR1A, BRCA1, BRCA2, BRIP1, CASR, CDC73, CDH1, CDK4, CDKN1B, CDKN1C, CDKN2A (p14ARF), CDKN2A (p16INK4a), CEBPA, CHEK2, CTNNA1, DICER1, DIS3L2, EGFR (c.2369C>T, p.Thr790Met variant only), EPCAM (Deletion/duplication testing only), FH, FLCN, GATA2, GPC3, GREM1 (Promoter region deletion/duplication testing only), HOXB13 (c.251G>A, p.Gly84Glu), HRAS, KIT, MAX, MEN1, MET, MITF (c.952G>A, p.Glu318Lys variant only), MLH1, MSH2, MSH3, MSH6, MUTYH, NBN, NF1, NF2, NTHL1, PALB2, PDGFRA, PHOX2B, PMS2, POLD1, POLE, POT1, PRKAR1A, PTCH1, PTEN, RAD50, RAD51C, RAD51D,  RB1, RECQL4, RET, RUNX1, SDHAF2, SDHA (sequence changes only),  SDHB, SDHC, SDHD, SMAD4, SMARCA4, SMARCB1, SMARCE1, STK11, SUFU, TERC, TERT, TMEM127, TP53, TSC1, TSC2, VHL, WRN and WT1.      REVIEW OF SYSTEMS:   Noncontributory except as noted in the HPI.  Past Medical History:  Diagnosis Date   Acid reflux    Anemia    Arthritis    "maybe in my fingers" (03/26/2018)   Breast cancer ([place]) 12/2020   both sides   Concussion 2001   no deficit had tia right after no problems since   Family history of adverse reaction to anesthesia    "brother, Timmy, and my mom always get sick" (03/26/2018)   Family history of brain cancer    Family history of breast cancer    Family history of colon cancer    Family history of lung cancer    Family history of pancreatic cancer    Family history of prostate cancer    Headache    High blood pressure    High cholesterol    Hip pain    "left; before OR" (03/26/2018)   History of chemotherapy last done 02-17-2021   History of hiatal hernia    History of kidney stones    OSA (obstructive sleep apnea)    "can't tolerate the mask" (03/26/2018)    PONV (postoperative nausea and vomiting)    does not need much anesthesia, slow to awlaekn in past   Pre-diabetes    Shortness of breath    with exertion   Wears partial dentures upper   Past Surgical History:  Procedure Laterality Date   CARPAL TUNNEL RELEASE Right ~ 2006   JOINT REPLACEMENT     LAPAROSCOPIC CHOLECYSTECTOMY  12/2010   LASIK Bilateral 2000   PORT A CATH REVISION Left 02/21/2021   Procedure: PORT A CATH REVISION;  Surgeon: Stark Klein, MD;  Location: [facility];  Service: General;  Laterality: Left;  60 MINUTES ROOM 5   PORTACATH PLACEMENT N/A 02/08/2021   Procedure: INSERTION PORT-A-CATH;  Surgeon: Stark Klein, MD;  Location: [facility];  Service: General;  Laterality: N/A;   TOTAL HIP ARTHROPLASTY Left 03/25/2018   Procedure: LEFT TOTAL HIP ARTHROPLASTY ANTERIOR APPROACH;  Surgeon: Mcarthur Rossetti, MD;  Location: [facility];   Service: Orthopedics;  Laterality: Left;   Family History  Problem Relation Age of Onset   Diabetes Mother    Thyroid disease Mother    Liver disease Mother    Obesity Mother    Hypertension Father    Hyperlipidemia Father    Heart disease Father    Stroke Father    Obesity Father    Colon polyps Father    Prostate cancer Brother    Brain cancer Maternal Grandfather    Pancreatic cancer Paternal Grandmother    Colon cancer Neg Hx        No one in family with colon cancer.    GYNECOLOGIC HISTORY:  No LMP recorded (lmp unknown). Patient is postmenopausal. Menarche: 55 years old [place] P 0 LMP 53 HRT no  Hysterectomy? no BSO? no     SOCIAL HISTORY: (updated 12/2020)  Rosaria Ferries "Leslie Wright" works for [employer] in [employer].  She works from home.  She is a Engineer, mining.  She describes herself as single, lives by herself, with a toy poodle and a [place] as well as 2 cats.             ADVANCED DIRECTIVES: The patient's  brother Leba Tibbitts is her healthcare power of attorney.  He can be reached at [phone]  PHYSICAL EXAMINATION: ECOG PERFORMANCE STATUS: 1 - Symptomatic but completely ambulatory  Vitals:   04/24/21 0309 04/24/21 0420  BP:  118/63  Pulse:  95  Resp:  18  Temp:  98.3 F (36.8 C)  SpO2: 95% 98%   Filed Weights   04/20/21 1745  Weight: 135.2 kg    Intake/Output from previous day: 07/24 0701 - 07/25 0700 In: 3842.6 [P.O.:757; I.V.:2088.2; Blood:382.5; IV Piggyback:299.9] Out: 400 [Urine:400]  GENERAL:alert, no distress and comfortable SKIN: skin color, texture, turgor are normal, no rashes or significant lesions EYES: normal, Conjunctiva are pink and non-injected, sclera clear OROPHARYNX:no exudate, no erythema and lips, buccal mucosa, and tongue normal  LUNGS: Diminished breath sounds, no wheezes HEART: regular rate & rhythm and no murmurs and no lower extremity edema ABDOMEN:abdomen soft, non-tender and normal bowel sounds NEURO: alert & oriented  x 3 with fluent speech, no focal motor/sensory deficits  Port-A-Cath without erythema  LABORATORY DATA:  I have reviewed the data as listed CMP Latest Ref Rng & Units 04/24/2021 04/23/2021 04/22/2021  Glucose 70 - 99 mg/dL 109(H) 101(H) 119(H)  BUN 6 - 20 mg/dL _0 Creatinine 0.44 - 1.00 mg/dL 1.05(H) 1.38(H) 1.42(H)  Sodium 135 - 145 mmol/L 132(L) 133(L) 130(L)  Potassium 3.5 - 5.1 mmol/L 3.3(L) 3.5 3.0(L)  Chloride 98 - 111 mmol/L 102 103 103  CO2 22 - 32 mmol/L 21(L) 20(L) 20(L)  Calcium 8.9 - 10.3 mg/dL 8.1(L) 8.3(L) 7.4(L)  Total Protein 6.5 - 8.1 g/dL - - -  Total Bilirubin 0.3 - 1.2 mg/dL - - -  Alkaline Phos 38 - 126 U/L - - -  AST 15 - 41 U/L - - -  ALT 0 - 44 U/L - - -    Lab Results  Component Value Date   WBC 9.1 04/24/2021   HGB 7.4 (L) 04/24/2021   HCT 21.5 (L) 04/24/2021   MCV 91.1 04/24/2021   PLT 103 (L) 04/24/2021   NEUTROABS 6.7 04/24/2021    DG Chest 2 View  Result Date: 04/20/2021 CLINICAL DATA:  CHILLS - work up [employer] for possible infection due to chemotherapy EXAM: CHEST - 2 VIEW COMPARISON:  Radiograph 02/21/2021, chest CT 02/15/2021 FINDINGS: Chest port catheter tip overlies the distal superior vena cava. Unchanged cardiomediastinal silhouette. There is no new focal airspace disease. No pleural effusion or visible pneumothorax. No acute osseous abnormality. IMPRESSION: No evidence of acute cardiopulmonary disease. Electronically Signed   By: Maurine Simmering   On: 04/20/2021 15:07   DG CHEST PORT 1 VIEW  Result Date: 04/24/2021 CLINICAL DATA:  Shortness of breath and dry cough, breast cancer on chemotherapy, neutropenic fever EXAM: PORTABLE CHEST 1 VIEW COMPARISON:  Radiograph 04/20/2021 FINDINGS: Markedly low lung volumes with some atelectatic changes. Diffuse hazy interstitial opacities are present throughout both lungs with more coalescent opacity seen in the right upper lung. Increasing prominence of the cardiac silhouette may reflect some  diminished volumes and portable technique. Pulmonary vascularity is indistinct. Tunneled left IJ approach Port-A-Cath tip terminates in the mid SVC, similar to prior. Port-A-Cath is currently accessed at this time. No discernible acute osseous or soft tissue abnormalities of chest wall. Degenerative changes are present in the imaged spine and shoulders. IMPRESSION: Diminishing lung volumes with interval development of diffuse hazy interstitial opacities bilaterally and more coalescent density in the right upper lobe. Findings could reflect pulmonary edema, acute infectious/inflammatory airspace  disease, and/or ARDS. Prominence of the cardiac silhouette may be related to low volumes and portable technique. Electronically Signed   By: Lovena Le M.D.   On: 04/24/2021 04:01    ASSESSMENT: 55 y.o. [place] woman presenting with bilateral breast cancers April 2022 as follows:   (1) in the right breast, a clinical T2 N0-1, stage IIA-IIB invasive ductal carcinoma, grade 2, estrogen receptor positive, progesterone receptor and HER2 negative, with an MIB-1 of 5%;             (a)  a second, retroareolar mass was benign but discordant; biopsy 02/07/2021             (b) borderline right axillary node not amenable to biopsy             (c) extensive patchy enhancement noted throughout the inferior right breast   (2) in the left breast, a clinical T3 N0, stage IIIB invasive ductal carcinoma, grade 3, functionally triple negative, with an MIB-1 of 25% (HER2 results are pending).             (a) secondary biopsy shows ductal carcinoma in situ associated with extensive patchy enhancement   (3) staging studies:             (a) chest CT scan 02/06/2021 shows hepatic steatosis and degenerative disc disease, no evidence of metastatic disease             (b) bone scan 02/06/2021 shows no evidence of metastatic disease   (4) neoadjuvant chemotherapy will start 02/09/2021 consisting of pembrolizumab/carboplatin/paclitaxel  stopped one cycle early due to peripheral neuropathy followed by pembrolizumab/doxorubicin/cyclophosphamide   (5) definitive surgery to follow   (6) adjuvant radiation to bilateral sites   (7) Negative genetic testing on 02/13/2021. VUS in ATM called c.1132A>G identified.  Genes tested include: AIP, ALK, APC, ATM, AXIN2,BAP1,  BARD1, BLM, BMPR1A, BRCA1, BRCA2, BRIP1, CASR, CDC73, CDH1, CDK4, CDKN1B, CDKN1C, CDKN2A (p14ARF), CDKN2A (p16INK4a), CEBPA, CHEK2, CTNNA1, DICER1, DIS3L2, EGFR (c.2369C>T, p.Thr790Met variant only), EPCAM (Deletion/duplication testing only), FH, FLCN, GATA2, GPC3, GREM1 (Promoter region deletion/duplication testing only), HOXB13 (c.251G>A, p.Gly84Glu), HRAS, KIT, MAX, MEN1, MET, MITF (c.952G>A, p.Glu318Lys variant only), MLH1, MSH2, MSH3, MSH6, MUTYH, NBN, NF1, NF2, NTHL1, PALB2, PDGFRA, PHOX2B, PMS2, POLD1, POLE, POT1, PRKAR1A, PTCH1, PTEN, RAD50, RAD51C, RAD51D, RB1, RECQL4, RET, RUNX1, SDHAF2, SDHA (sequence changes only), SDHB, SDHC, SDHD, SMAD4, SMARCA4, SMARCB1, SMARCE1, STK11, SUFU, TERC, TERT, TMEM127, TP53, TSC1, TSC2, VHL, WRN and WT1.   PLAN: Leslie Wright was admitted with febrile neutropenia.  She is now feeling better.  She is not having any fevers at this time.  Her infectious work-up has been negative to date.  Her CBC from today has been reviewed.  Her WBC has now recovered.  This is likely due to the G-CSF that she received in our office following her last cycle of chemotherapy.  Her hemoglobin today is 7.4.  We will go ahead and give her 1 unit PRBCs today.  Risk/benefits of PRBC transfusion have been discussed with the patient and she is agreeable to proceed.  Her platelet count is slightly low at 103,000, but improved.  This is likely due to her recent chemotherapy and should slowly continue to recover.  Given improvement in her blood counts and symptoms, she may be considered for discharge from our standpoint once she has received her blood transfusion.  Would recommend  a course of oral antibiotics upon discharge.  We will arrange for outpatient follow-up at the cancer center.  LOS: 4 days   Mikey Bussing, DNP, AGPCNP-BC, AOCNP 04/24/21   ADDENDUM: Leslie Wright looks much better than when I last saw her. Her counts have recovered and she is agitating to go home, which is a good sign. She will receive blood today and I am going to drop her chemotherapy dose for the next cycle. She alreadyhas a follow up appointment at the [facility] and will keep that.  I greatly appreciate the care this patient received in [place]!  I personally saw this patient and performed a substantive portion of this encounter with the listed APP documented above.   Chauncey Cruel, MD Medical Oncology and Hematology [facility] [address] [place], [place] [address] Tel. [phone]    Fax. [phone]

## 2021-04-24 NOTE — Progress Notes (Addendum)
PROGRESS NOTE    Leslie Wright  [id number] DOB: [birth year] DOA: 04/20/2021 PCP: Fanny Bien, MD    Brief Narrative:  Leslie Wright is a 55 year old female with past medical history significant for breast cancer currently on chemotherapy, essential hypertension, CKD stage IIIa, prediabetes who presented to [facility] ED on 7/21 for fever with associated chills.  Patient was seen by her oncologist outpatient lab work revealing [place] count of 0.  She received IV Rocephin in the office and was sent to the emergency department for admission.  Patient completed her last chemotherapy 1 week ago and received pegfilgrastim on 7/16.   In the ED, work-up notable for neutropenic fever.  Patient was started on IV cefepime, vancomycin and received Tylenol.  TRH consulted for further evaluation management.   Assessment & Plan:   Principal Problem:   Neutropenic fever ([place]) Active Problems:   Essential hypertension   Morbid obesity with BMI of 40.0-44.9, adult (HCC)   Prediabetes   Malignant neoplasm of overlapping sites of left breast in female, estrogen receptor negative (HCC)   Hepatic steatosis   CKD (chronic kidney disease) stage 3, GFR 30-59 ml/min (HCC)   Neutropenic fever with pancytopenia secondary to chemotherapy Patient presenting from her oncologist office with lab work revealing [place] count of 0.  Urinalysis unrevealing and urine culture with multiple species present..  Chest x-ray with no acute cardiopulmonary disease process.  Stool culture for E. coli, Shiga toxin negative. --Now afebrile past 24 hours --Blood cultures x 4 7/21: No growth x4 days --Stool culture for Campylobacter: Pending --Continue Levaquin 750 mg p.o. daily, will continue to complete 7-day course of antibiotics --Transfuse 1 unit PRBC today, received 1 unit on 7/23, 7/24 --Repeat CBC in a.m. with differential  Breast cancer, estrogen receptor negative Last chemotherapy completed 1 week ago.  Follows with  medical oncology, Dr. Jana Hakim outpatient. --Oncology following, appreciate assistance  Hypokalemia Potassium 3.3, will replete. --Repeat electrolytes in a.m.  Essential hypertension BP 118/63 this morning, well controlled. --Losartan 100 mg p.o. daily --Continue aspirin and statin  HLD: Crestor 10 mg p.o. nightly  GERD: Continue PPI  CKD stage IIIa Creatinine 1.05, stable at baseline. --Avoid nephrotoxins, renally dose all medications  Morbid obesity Body mass index is 46.67 kg/m.  Discussed with patient needs for aggressive lifestyle changes/weight loss as this complicates all facets of care.  Outpatient follow-up with PCP.   DVT prophylaxis: SCDs Start: 04/20/21 1942   Code Status: Full Code Family Communication: No family present at bedside this morning  Disposition Plan:  Level of care: Med-Surg Status is: Inpatient  Remains inpatient appropriate because:Unsafe d/c plan, IV treatments appropriate due to intensity of illness or inability to take PO, and Inpatient level of care appropriate due to severity of illness  Dispo: The patient is from: Home              Anticipated d/c is to: Home              Patient currently is not medically stable to d/c.   Difficult to place patient No    Consultants:  Medical oncology  Procedures:  None  Antimicrobials:  Levaquin 7/25>> Cefepime 7/21 - 7/25 Vancomycin 7/21 - 7/21 Ceftriaxone 7/21 - 7/21     Subjective: Patient seen examined bedside, resting comfortably.  No specific complaints this morning.  Seen by medical oncology, transfusing 1 unit PRBC today.  No other specific complaints or concerns at this time.  Denies headache, no current fever,  no current chills, no shortness of breath, no chest pain, no palpitations, no abdominal pain, no weakness, no fatigue, no paresthesias.  No acute events overnight per nursing staff.  Objective: Vitals:   04/24/21 1333 04/24/21 1338 04/24/21 1448 04/24/21 1725  BP:  118/64   118/73  Pulse: 93  88 86  Resp: 20   18  Temp: 98.6 F (37 C)   98.6 F (37 C)  TempSrc: Oral   Oral  SpO2: (!) 87% 94% 94% 95%  Weight:      Height:        Intake/Output Summary (Last 24 hours) at 04/24/2021 1750 Last data filed at 04/24/2021 1328 Gross per 24 hour  Intake 2085.06 ml  Output 800 ml  Net 1285.06 ml   Filed Weights   04/20/21 1745  Weight: 135.2 kg    Examination:  General exam: Appears calm and comfortable, chronically ill appearance Respiratory system: Clear to auscultation. Respiratory effort normal.  On 2 L nasal cannula Cardiovascular system: S1 & S2 heard, RRR. No JVD, murmurs, rubs, gallops or clicks. No pedal edema. Gastrointestinal system: Abdomen is nondistended, soft and nontender. No organomegaly or masses felt. Normal bowel sounds heard. Central nervous system: Alert and oriented. No focal neurological deficits. Extremities: Symmetric 5 x 5 power. Skin: No rashes, lesions or ulcers Psychiatry: Judgement and insight appear normal. Mood & affect appropriate.     Data Reviewed: I have personally reviewed following labs and imaging studies  CBC: Recent Labs  Lab 04/20/21 1459 04/21/21 0456 04/22/21 0505 04/22/21 2038 04/23/21 0430 04/23/21 2051 04/24/21 0552  WBC 0.2* 0.2* 0.4*  --  2.7*  --  9.1  NEUTROABS 0.0*  --  0.2*  --   --   --  6.7  HGB 8.9* 7.6* 6.7* 7.0* 7.0* 7.8* 7.4*  HCT 25.9* 22.7* 20.1* 20.6* 21.1* 22.7* 21.5*  MCV 90.6 93.0 92.2  --  92.1  --  91.1  PLT 58* 49* 45*  --  60*  --  [id number]   Basic Metabolic Panel: Recent Labs  Lab 04/20/21 1459 04/21/21 0456 04/22/21 0505 04/23/21 0430 04/24/21 0552  NA 132* 132* 130* 133* 132*  K 3.6 3.5 3.0* 3.5 3.3*  CL 99 101 103 103 102  CO2 23 20* 20* 20* 21*  GLUCOSE 112* 118* 119* 101* 109*  BUN 18 22* '20 19 13  '$ CREATININE 1.20* 1.52* 1.42* 1.38* 1.05*  CALCIUM 8.3* 7.7* 7.4* 8.3* 8.1*  MG  --   --   --  1.8  --    GFR: Estimated Creatinine Clearance: 85.9  mL/min (A) (by C-G formula based on SCr of 1.05 mg/dL (H)). Liver Function Tests: Recent Labs  Lab 04/20/21 1459 04/21/21 0456  AST 14* 16  ALT 22 21  ALKPHOS 57 55  BILITOT 1.2 0.9  PROT 6.4* 5.8*  ALBUMIN 3.4* 2.8*   No results for input(s): LIPASE, AMYLASE in the last 168 hours. No results for input(s): AMMONIA in the last 168 hours. Coagulation Profile: No results for input(s): INR, PROTIME in the last 168 hours. Cardiac Enzymes: No results for input(s): CKTOTAL, CKMB, CKMBINDEX, TROPONINI in the last 168 hours. BNP (last 3 results) No results for input(s): PROBNP in the last 8760 hours. HbA1C: No results for input(s): HGBA1C in the last 72 hours. CBG: Recent Labs  Lab 04/23/21 1202 04/23/21 1757 04/23/21 2122 04/24/21 0722 04/24/21 1141  GLUCAP 90 113* 108* 106* 102*   Lipid Profile: No results for input(s): CHOL, HDL, LDLCALC,  TRIG, CHOLHDL, LDLDIRECT in the last 72 hours. Thyroid Function Tests: No results for input(s): TSH, T4TOTAL, FREET4, T3FREE, THYROIDAB in the last 72 hours. Anemia Panel: No results for input(s): VITAMINB12, FOLATE, FERRITIN, TIBC, IRON, RETICCTPCT in the last 72 hours. Sepsis Labs: No results for input(s): PROCALCITON, LATICACIDVEN in the last 168 hours.  Recent Results (from the past 240 hour(s))  Culture, Blood     Status: None (Preliminary result)   Collection Time: 04/20/21  2:59 PM   Specimen: BLOOD  Result Value Ref Range Status   Specimen Description   Final    BLOOD Performed at [facility], [place] [address], [place], [place] [address]    Special Requests   Final    PORTA CATH Performed at [facility], [place] [address], [place], [place] [address]    Culture   Final    NO GROWTH 4 DAYS Performed at [facility], [place] [address] [place], [place] [address]    Report Status PENDING  Incomplete  Urine Culture     Status: Abnormal   Collection Time: 04/20/21  3:05 PM    Specimen: Urine, Clean Catch  Result Value Ref Range Status   Specimen Description   Final    URINE, CLEAN CATCH Performed at [facility], [place] [address], [place], [place] [address]    Special Requests   Final    NONE Performed at [facility], [place] [address], [place], [place] [address]    Culture MULTIPLE SPECIES PRESENT, SUGGEST RECOLLECTION (A)  Final   Report Status 04/22/2021 FINAL  Final  Culture, Blood     Status: None (Preliminary result)   Collection Time: 04/20/21  3:45 PM   Specimen: BLOOD RIGHT ARM  Result Value Ref Range Status   Specimen Description BLOOD RIGHT ARM  Final   Special Requests   Final    BOTTLES DRAWN AEROBIC AND ANAEROBIC Blood Culture results may not be optimal due to an excessive volume of blood received in culture bottles   Culture   Final    NO GROWTH 4 DAYS Performed at [facility], [place] [address] [place], [place] [address]    Report Status PENDING  Incomplete  Resp Panel by RT-PCR (Flu A&B, Covid) Nasopharyngeal Swab     Status: None   Collection Time: 04/20/21  5:55 PM   Specimen: Nasopharyngeal Swab; Nasopharyngeal(NP) swabs in vial transport medium  Result Value Ref Range Status   SARS Coronavirus 2 by RT PCR NEGATIVE NEGATIVE Final    Comment: (NOTE) SARS-CoV-2 target nucleic acids are NOT DETECTED.  The SARS-CoV-2 RNA is generally detectable in upper respiratory specimens during the acute phase of infection. The lowest concentration of SARS-CoV-2 viral copies this assay can detect is 138 copies/mL. A negative result does not preclude SARS-Cov-2 infection and should not be used as the sole basis for treatment or other patient management decisions. A negative result may occur with  improper specimen collection/handling, submission of specimen other than nasopharyngeal swab, presence of viral mutation(s) within the areas targeted by this assay, and inadequate number of  viral copies(<138 copies/mL). A negative result must be combined with clinical observations, patient history, and epidemiological information. The expected result is Negative.  Fact Sheet for Patients:  EntrepreneurPulse.com.au  Fact Sheet for Healthcare Providers:  IncredibleEmployment.be  This test is no t yet approved or cleared by the [place] FDA and  has been authorized for detection  and/or diagnosis of SARS-CoV-2 by FDA under an Emergency Use Authorization (EUA). This EUA will remain  in effect (meaning this test can be used) for the duration of the COVID-19 declaration under Section 564(b)(1) of the Act, 21 U.S.C.section 360bbb-3(b)(1), unless the authorization is terminated  or revoked sooner.       Influenza A by PCR NEGATIVE NEGATIVE Final   Influenza B by PCR NEGATIVE NEGATIVE Final    Comment: (NOTE) The Xpert Xpress SARS-CoV-2/FLU/RSV plus assay is intended as an aid in the diagnosis of influenza from Nasopharyngeal swab specimens and should not be used as a sole basis for treatment. Nasal washings and aspirates are unacceptable for Xpert Xpress SARS-CoV-2/FLU/RSV testing.  Fact Sheet for Patients: EntrepreneurPulse.com.au  Fact Sheet for Healthcare Providers: IncredibleEmployment.be  This test is not yet approved or cleared by the [place] FDA and has been authorized for detection and/or diagnosis of SARS-CoV-2 by FDA under an Emergency Use Authorization (EUA). This EUA will remain in effect (meaning this test can be used) for the duration of the COVID-19 declaration under Section 564(b)(1) of the Act, 21 U.S.C. section 360bbb-3(b)(1), unless the authorization is terminated or revoked.  Performed at [facility], [place] [address], [place], [place] [address]   Culture, blood (x 2)     Status: None (Preliminary result)   Collection Time: 04/20/21  9:07 PM    Specimen: BLOOD LEFT FOREARM  Result Value Ref Range Status   Specimen Description   Final    BLOOD LEFT FOREARM Performed at [facility], [place] [address], [place], [place] [address]    Special Requests   Final    BOTTLES DRAWN AEROBIC ONLY Blood Culture adequate volume Performed at [place] [address] [place], [place] [address]    Culture   Final    NO GROWTH 4 DAYS Performed at [facility], [place] [address] [place], [place] [address]    Report Status PENDING  Incomplete  Culture, blood (x 2)     Status: None (Preliminary result)   Collection Time: 04/20/21  9:07 PM   Specimen: BLOOD RIGHT FOREARM  Result Value Ref Range Status   Specimen Description   Final    BLOOD RIGHT FOREARM Performed at [facility], [place] [address] [place], [place] [address]    Special Requests   Final    BOTTLES DRAWN AEROBIC ONLY Blood Culture adequate volume Performed at [place] [address], [place], [place] [address]    Culture   Final    NO GROWTH 4 DAYS Performed at [facility], [place] [address] [place], [place] [address]    Report Status PENDING  Incomplete  Stool culture (children & immunocomp patients)     Status: None (Preliminary result)   Collection Time: 04/21/21  4:55 AM   Specimen: Stool  Result Value Ref Range Status   Salmonella/Shigella Screen Final report  Final   Campylobacter Culture PENDING  Incomplete   E coli, Shiga toxin Assay Negative Negative Final    Comment: (NOTE) Performed At: [facility] [place], [place] [id number] Rush Farmer MD [id number]   C Difficile Quick Screen w PCR reflex     Status: None   Collection Time: 04/21/21  4:55 AM   Specimen: Stool  Result Value Ref Range Status   C Diff antigen NEGATIVE NEGATIVE Final   C Diff toxin NEGATIVE NEGATIVE Final   C Diff interpretation  No C. difficile detected.  Final    Comment: Performed at [facility], [place] [address], [place], [place] [address]  STOOL CULTURE REFLEX - RSASHR     Status: None   Collection Time: 04/21/21  4:55 AM  Result Value Ref Range Status   Stool Culture result 1 (RSASHR) Comment  Final    Comment: (NOTE) No Salmonella or Shigella recovered. Performed At: [facility] [place], [place] [id number] Rush Farmer MD [id number]          Radiology Studies: DG CHEST PORT 1 VIEW  Result Date: 04/24/2021 CLINICAL DATA:  Shortness of breath and dry cough, breast cancer on chemotherapy, neutropenic fever EXAM: PORTABLE CHEST 1 VIEW COMPARISON:  Radiograph 04/20/2021 FINDINGS: Markedly low lung volumes with some atelectatic changes. Diffuse hazy interstitial opacities are present throughout both lungs with more coalescent opacity seen in the right upper lung. Increasing prominence of the cardiac silhouette may reflect some diminished volumes and portable technique. Pulmonary vascularity is indistinct. Tunneled left IJ approach Port-A-Cath tip terminates in the mid SVC, similar to prior. Port-A-Cath is currently accessed at this time. No discernible acute osseous or soft tissue abnormalities of chest wall. Degenerative changes are present in the imaged spine and shoulders. IMPRESSION: Diminishing lung volumes with interval development of diffuse hazy interstitial opacities bilaterally and more coalescent density in the right upper lobe. Findings could reflect pulmonary edema, acute infectious/inflammatory airspace disease, and/or ARDS. Prominence of the cardiac silhouette may be related to low volumes and portable technique. Electronically Signed   By: Lovena Le M.D.   On: 04/24/2021 04:01        Scheduled Meds:  aspirin EC  81 mg Oral Daily   Chlorhexidine Gluconate Cloth  6 each Topical Daily   cholecalciferol  2,000 Units Oral Daily   feeding supplement  1 Container Oral BID BM   folic acid  1 mg Oral Daily   insulin  aspart  0-15 Units Subcutaneous TID WC   insulin aspart  0-5 Units Subcutaneous QHS   levofloxacin  750 mg Oral Daily   losartan  100 mg Oral Daily   multivitamin with minerals  1 tablet Oral Daily   pantoprazole  40 mg Oral Daily   rosuvastatin  10 mg Oral QHS   Continuous Infusions:   LOS: 4 days    Time spent: 39 minutes spent on chart review, discussion with nursing staff, consultants, updating family and interview/physical exam; more than 50% of that time was spent in counseling and/or coordination of care.    Quantia Grullon J [place] DO [facility] Available via Epic secure chat 7am-7pm After these hours, please refer to coverage provider listed on [email or web address] 04/24/2021, 5:50 PM

## 2021-04-25 ENCOUNTER — Other Ambulatory Visit (HOSPITAL_COMMUNITY): Payer: Self-pay

## 2021-04-25 DIAGNOSIS — D709 Neutropenia, unspecified: Secondary | ICD-10-CM | POA: Diagnosis not present

## 2021-04-25 DIAGNOSIS — R5081 Fever presenting with conditions classified elsewhere: Secondary | ICD-10-CM | POA: Diagnosis not present

## 2021-04-25 LAB — BASIC METABOLIC PANEL
Anion gap: 11 (ref 5–15)
BUN: 12 mg/dL (ref 6–20)
CO2: 23 mmol/L (ref 22–32)
Calcium: 8.7 mg/dL — ABNORMAL LOW (ref 8.9–10.3)
Chloride: 102 mmol/L (ref 98–111)
Creatinine, Ser: 0.94 mg/dL (ref 0.44–1.00)
GFR, Estimated: >60 mL/min (ref >60)
Glucose, Bld: 101 mg/dL — ABNORMAL HIGH (ref 70–99)
Potassium: 3.3 mmol/L — ABNORMAL LOW (ref 3.5–5.1)
Sodium: 136 mmol/L (ref 135–145)

## 2021-04-25 LAB — CULTURE, BLOOD (ROUTINE X 2)
Culture: NO GROWTH
Culture: NO GROWTH
Special Requests: ADEQUATE
Special Requests: ADEQUATE

## 2021-04-25 LAB — BPAM RBC
Blood Product Expiration Date: 202208102359
Blood Product Expiration Date: 202208162359
Blood Product Expiration Date: 202208192359
ISSUE DATE / TIME: 202207231455
ISSUE DATE / TIME: 202207241548
ISSUE DATE / TIME: 202207251102
Unit Type and Rh: 7300
Unit Type and Rh: 7300
Unit Type and Rh: 7300

## 2021-04-25 LAB — CBC WITH DIFFERENTIAL/PLATELET
Abs Immature Granulocytes: 3.24 10*3/uL — ABNORMAL HIGH (ref 0.00–0.07)
Basophils Absolute: 0 10*3/uL (ref 0.0–0.1)
Basophils Relative: 0 %
Eosinophils Absolute: 0 10*3/uL (ref 0.0–0.5)
Eosinophils Relative: 0 %
HCT: 25.7 % — ABNORMAL LOW (ref 36.0–46.0)
Hemoglobin: 8.8 g/dL — ABNORMAL LOW (ref 12.0–15.0)
Immature Granulocytes: 22 %
Lymphocytes Relative: 4 %
Lymphs Abs: 0.6 10*3/uL — ABNORMAL LOW (ref 0.7–4.0)
MCH: 31.5 pg (ref 26.0–34.0)
MCHC: 34.2 g/dL (ref 30.0–36.0)
MCV: 92.1 fL (ref 80.0–100.0)
Monocytes Absolute: 1.9 10*3/uL — ABNORMAL HIGH (ref 0.1–1.0)
Monocytes Relative: 13 %
Neutro Abs: 9.1 10*3/uL — ABNORMAL HIGH (ref 1.7–7.7)
Neutrophils Relative %: 61 %
Platelets: 134 10*3/uL — ABNORMAL LOW (ref 150–400)
RBC: 2.79 MIL/uL — ABNORMAL LOW (ref 3.87–5.11)
RDW: 18.6 % — ABNORMAL HIGH (ref 11.5–15.5)
WBC: 14.9 10*3/uL — ABNORMAL HIGH (ref 4.0–10.5)
nRBC: 1.2 % — ABNORMAL HIGH (ref 0.0–0.2)

## 2021-04-25 LAB — MAGNESIUM: Magnesium: 1.6 mg/dL — ABNORMAL LOW (ref 1.7–2.4)

## 2021-04-25 LAB — TYPE AND SCREEN
ABO/RH(D): B POS
Antibody Screen: NEGATIVE
Unit division: 0
Unit division: 0
Unit division: 0

## 2021-04-25 LAB — CULTURE, BLOOD (SINGLE)
Culture: NO GROWTH
Culture: NO GROWTH

## 2021-04-25 LAB — STOOL CULTURE REFLEX - RSASHR

## 2021-04-25 LAB — STOOL CULTURE REFLEX - CMPCXR

## 2021-04-25 LAB — STOOL CULTURE: E coli, Shiga toxin Assay: NEGATIVE

## 2021-04-25 LAB — GLUCOSE, CAPILLARY
Glucose-Capillary: 107 mg/dL — ABNORMAL HIGH (ref 70–99)
Glucose-Capillary: 118 mg/dL — ABNORMAL HIGH (ref 70–99)
Glucose-Capillary: 103 mg/dL — ABNORMAL HIGH (ref 70–99)

## 2021-04-25 MED ORDER — LEVOFLOXACIN 750 MG PO TABS
750.0000 mg | ORAL_TABLET | Freq: Every day | ORAL | 0 refills | Status: AC
  Filled 2021-04-25: qty 5, 5d supply, fill #0

## 2021-04-25 MED ORDER — HEPARIN SOD (PORK) LOCK FLUSH 100 UNIT/ML IV SOLN
500.0000 [IU] | INTRAVENOUS | Status: DC | PRN
  Filled 2021-04-25: qty 5

## 2021-04-25 MED ORDER — MAGNESIUM SULFATE 2 GM/50ML IV SOLN
2.0000 g | Freq: Once | INTRAVENOUS | Status: AC
  Administered 2021-04-25: 2 g via INTRAVENOUS
  Filled 2021-04-25: qty 50

## 2021-04-25 MED ORDER — POTASSIUM CHLORIDE CRYS ER 20 MEQ PO TBCR
40.0000 meq | EXTENDED_RELEASE_TABLET | ORAL | Status: AC
  Administered 2021-04-25 (×2): 40 meq via ORAL
  Filled 2021-04-25 (×2): qty 2

## 2021-04-25 NOTE — TOC Transition Note (Signed)
Transition of Care [facility]) - CM/SW Discharge Note   Patient Details  Name: Leslie Wright MRN: [id number] Date of Birth: 1966-06-25  Transition of Care [facility] CM/SW Contact:  Lynnell Catalan, RN Phone Number: 04/25/2021, 10:36 AM   Clinical Narrative:     Home 02 ordered by MD. Desaturation screen done by nursing staff. O2 referral called to [employer] and travel tank will be delivered to pt room.

## 2021-04-25 NOTE — Discharge Summary (Addendum)
Physician Discharge Summary  Leslie Wright JXB:[id number] DOB: 12/05/1965 DOA: 04/20/2021  PCP: Fanny Bien, MD  Admit date: 04/20/2021 Discharge date: 04/25/2021  Admitted From: Home Disposition: Home  Recommendations for Outpatient Follow-up:  Follow up with PCP in 1-2 weeks Follow-up with medical oncology, Dr. Jana Hakim as scheduled Continue Levaquin 750 mg p.o. daily for additional 5 days at time of discharge Please obtain BMP/CBC in one week Follow-up Campylobacter stool culture which was pending at time of discharge  Home Health: No Equipment/Devices: Oxygen 2 L per nasal cannula  Discharge Condition: Stable CODE STATUS: Full code Diet recommendation: Heart healthy diet  History of present illness:  Leslie Wright is a 55 year old female with past medical history significant for breast cancer currently on chemotherapy, essential hypertension, CKD stage IIIa, prediabetes who presented to [facility] ED on 7/21 for fever with associated chills.  Patient was seen by her oncologist outpatient lab work revealing [place] count of 0.  She received IV Rocephin in the office and was sent to the emergency department for admission.  Patient completed her last chemotherapy 1 week ago and received pegfilgrastim on 7/16.   In the ED, work-up notable for neutropenic fever.  Patient was started on IV cefepime, vancomycin and received Tylenol.  TRH consulted for further evaluation management.  Hospital course:  Neutropenic fever with pancytopenia secondary to chemotherapy Patient presenting from her oncologist office with lab work revealing [place] count of 0.  Urinalysis unrevealing and urine culture with multiple species present..  Chest x-ray with no acute cardiopulmonary disease process.  Stool culture for E. coli, Shiga toxin negative.  Blood cultures x4 were performed that were no growth x5 days.  Patient was initially started on empiric antibiotics with vancomycin and cefepime which was de-escalated  to Levaquin, will continue on discharge to complete 10-day course of antibiotics.  Has been afebrile for the last 48 hours.  Patient also was transfused 3 units PRBCs during hospitalization.  Hemoglobin stable, 8.8 at time of discharge.  Recommend CBC 1 week.  Outpatient follow-up with medical oncology.  Acute hypoxic respiratory failure, POA Patient with neutropenic fever and pancytopenia likely related to immunosuppression/chemotherapy related.  Patient on ambulation with notable desaturation to 80% on room air with associated shortness of breath with recovery on 2 L nasal cannula.  Patient was transfused 3 unit PRBCs with appropriate response and hemoglobin 8.8 at time of discharge although, patient will need home oxygen on discharge 2 L nasal cannula.   Breast cancer, estrogen receptor negative Last chemotherapy completed 1 week ago.  Follows with medical oncology, Dr. Jana Hakim outpatient.   Hypokalemia Repleted during hospitalization.  Recommend BMP 1 week.   Essential hypertension Continue home Losartan 100 mg p.o. daily. On aspirin and statin   HLD: Crestor 10 mg p.o. nightly   GERD: Continue PPI   CKD stage IIIa Creatinine 0.94 at time of discharge.   Morbid obesity Body mass index is 46.67 kg/m.  Discussed with patient needs for aggressive lifestyle changes/weight loss as this complicates all facets of care.  Outpatient follow-up with PCP.  Discharge Diagnoses:  Active Problems:   Essential hypertension   Morbid obesity with BMI of 40.0-44.9, adult (HCC)   Prediabetes   Malignant neoplasm of overlapping sites of left breast in female, estrogen receptor negative (HCC)   Hepatic steatosis   CKD (chronic kidney disease) stage 3, GFR 30-59 ml/min [facility])    Discharge Instructions  Discharge Instructions     Call MD for:  difficulty breathing,  headache or visual disturbances   Complete by: As directed    Call MD for:  extreme fatigue   Complete by: As directed    Call MD  for:  persistant dizziness or light-headedness   Complete by: As directed    Call MD for:  persistant nausea and vomiting   Complete by: As directed    Call MD for:  severe uncontrolled pain   Complete by: As directed    Call MD for:  temperature >100.4   Complete by: As directed    Diet - low sodium heart healthy   Complete by: As directed    Increase activity slowly   Complete by: As directed       Allergies as of 04/25/2021   No Known Allergies      Medication List     STOP taking these medications    clindamycin 1 % gel Commonly known as: Clindagel       TAKE these medications    acetaminophen 500 MG tablet Commonly known as: TYLENOL Take 1,000 mg by mouth every 6 (six) hours as needed for moderate pain.   aspirin EC 81 MG tablet Take 81 mg by mouth daily.   folic acid 1 MG tablet Commonly known as: FOLVITE Take 1 mg by mouth daily.   FreeStyle Lite w/Device Kit CHECK GLUCOSE 2-3 TIMES DAILY   levofloxacin 750 MG tablet Commonly known as: LEVAQUIN Take 1 tablet (750 mg total) by mouth daily for 5 days.   methotrexate 15 MG tablet Commonly known as: RHEUMATREX Take 15 mg by mouth every Wednesday. Caution: Chemotherapy. Protect from light.   multivitamin with minerals Tabs tablet Take 1 tablet by mouth daily.   omeprazole 20 MG capsule Commonly known as: PRILOSEC Take 20 mg by mouth daily.   Vitamin D 50 MCG (2000 UT) Caps Take 2,000 Units by mouth daily.       ASK your doctor about these medications    betamethasone valerate ointment 0.1 % Commonly known as: VALISONE Apply 1 application topically 2 (two) times daily.   dexamethasone 4 MG tablet Commonly known as: DECADRON Take 2 tablets once a day for 3 days after carboplatin and AC chemotherapy. Take with food.   freestyle lancets USE AS DIRECTED TO CHECK BLOOD SUGAR 2 TO 3 TIMES A DAY   FREESTYLE LITE test strip Generic drug: glucose blood USE AS DIRECTED TO CHECK BLOOD SUGAR 1 TO  3 TIMES A DAY   ketoconazole 2 % cream Commonly known as: NIZORAL Apply 1 application topically daily.   lidocaine-prilocaine cream Commonly known as: EMLA Apply to affected area once   losartan 100 MG tablet Commonly known as: COZAAR Take 1 tablet by mouth daily   ondansetron 8 MG tablet Commonly known as: Zofran Take 1 tablet (8 mg total) by mouth 2 (two) times daily as needed. Start on the third day after carboplatin and AC chemotherapy.   prochlorperazine 10 MG tablet Commonly known as: COMPAZINE Take 1 tablet (10 mg total) by mouth every 6 (six) hours as needed (Nausea or vomiting).   rosuvastatin 10 MG tablet Commonly known as: CRESTOR TAKE 1 TABLET BY MOUTH ONCE A DAY AT BEDTIME FOR CHOLESTEROL               Durable Medical Equipment  (From admission, onward)           Start     Ordered   04/25/21 0933  For home use only DME oxygen  Once  Question Answer Comment  Length of Need 12 Months   Mode or (Route) Nasal cannula   Liters per Minute 2   Frequency Continuous (stationary and portable oxygen unit needed)   Oxygen conserving device Yes   Oxygen delivery system Gas      04/25/21 0932            Follow-up Information     Fanny Bien, MD. Schedule an appointment as soon as possible for a visit in 1 week(s).   Specialty: Family Medicine Contact information: [address] [place] [address] [phone]         Magrinat, Virgie Dad, MD. Go to.   Specialty: Oncology Contact information: [place] [address] [phone]                No Known Allergies  Consultations: Medical oncology   Procedures/Studies: DG Chest 2 View  Result Date: 04/20/2021 CLINICAL DATA:  CHILLS - work up protoctol for possible infection due to chemotherapy EXAM: CHEST - 2 VIEW COMPARISON:  Radiograph 02/21/2021, chest CT 02/15/2021 FINDINGS: Chest port catheter tip overlies the distal superior vena  cava. Unchanged cardiomediastinal silhouette. There is no new focal airspace disease. No pleural effusion or visible pneumothorax. No acute osseous abnormality. IMPRESSION: No evidence of acute cardiopulmonary disease. Electronically Signed   By: Maurine Simmering   On: 04/20/2021 15:07   DG CHEST PORT 1 VIEW  Result Date: 04/24/2021 CLINICAL DATA:  Shortness of breath and dry cough, breast cancer on chemotherapy, neutropenic fever EXAM: PORTABLE CHEST 1 VIEW COMPARISON:  Radiograph 04/20/2021 FINDINGS: Markedly low lung volumes with some atelectatic changes. Diffuse hazy interstitial opacities are present throughout both lungs with more coalescent opacity seen in the right upper lung. Increasing prominence of the cardiac silhouette may reflect some diminished volumes and portable technique. Pulmonary vascularity is indistinct. Tunneled left IJ approach Port-A-Cath tip terminates in the mid SVC, similar to prior. Port-A-Cath is currently accessed at this time. No discernible acute osseous or soft tissue abnormalities of chest wall. Degenerative changes are present in the imaged spine and shoulders. IMPRESSION: Diminishing lung volumes with interval development of diffuse hazy interstitial opacities bilaterally and more coalescent density in the right upper lobe. Findings could reflect pulmonary edema, acute infectious/inflammatory airspace disease, and/or ARDS. Prominence of the cardiac silhouette may be related to low volumes and portable technique. Electronically Signed   By: Lovena Le M.D.   On: 04/24/2021 04:01     Subjective: Patient seen examined bedside, resting comfortably.  No specific complaints this morning.  Ready for discharge home.  Seen by oncology yesterday, and okay for discharge home with outpatient follow-up scheduled.  Remains afebrile over the last 48 hours.  Denies headache, no current fever/chills, no night sweats, no nausea/vomiting/diarrhea, no chest pain, no palpitations, no shortness  of breath, no abdominal pain, no weakness, no fatigue, no paresthesias.  No acute events overnight per nursing staff.  Discharge Exam: Vitals:   04/25/21 0400 04/25/21 1014  BP: 139/76 129/84  Pulse: 100 87  Resp: 20 18  Temp: 99.2 F (37.3 C) 98.6 F (37 C)  SpO2: 91% 96%   Vitals:   04/24/21 1725 04/25/21 0049 04/25/21 0400 04/25/21 1014  BP: 118/73 132/72 139/76 129/84  Pulse: 86 88 100 87  Resp: 18 14 20 18   Temp: 98.6 F (37 C) 99.2 F (37.3 C) 99.2 F (37.3 C) 98.6 F (37 C)  TempSrc: Oral Oral Oral Oral  SpO2:  95% 94% 91% 96%  Weight:      Height:        General: Pt is alert, awake, not in acute distress, chronically ill in appearance Cardiovascular: RRR, S1/S2 +, no rubs, no gallops Respiratory: CTA bilaterally, no wheezing, no rhonchi, on 2 L nasal cannula Abdominal: Soft, NT, ND, bowel sounds + Extremities: no edema, no cyanosis    The results of significant diagnostics from this hospitalization (including imaging, microbiology, ancillary and laboratory) are listed below for reference.     Microbiology: Recent Results (from the past 240 hour(s))  Culture, Blood     Status: None   Collection Time: 04/20/21  2:59 PM   Specimen: BLOOD  Result Value Ref Range Status   Specimen Description   Final    BLOOD Performed at [facility], [place] [address], [place], [place] [address]    Special Requests   Final    PORTA CATH Performed at [facility], [place] [address], [place], [place] [address]    Culture   Final    NO GROWTH 5 DAYS Performed at [facility], [place] [address], [place], [place] [address]    Report Status 04/25/2021 FINAL  Final  Urine Culture     Status: Abnormal   Collection Time: 04/20/21  3:05 PM   Specimen: Urine, Clean Catch  Result Value Ref Range Status   Specimen Description   Final    URINE, CLEAN CATCH Performed at [facility], [place] [address],  [place], [place] [address]    Special Requests   Final    NONE Performed at [facility], [place] [address], [place], [place] [address]    Culture MULTIPLE SPECIES PRESENT, SUGGEST RECOLLECTION (A)  Final   Report Status 04/22/2021 FINAL  Final  Culture, Blood     Status: None   Collection Time: 04/20/21  3:45 PM   Specimen: BLOOD RIGHT ARM  Result Value Ref Range Status   Specimen Description BLOOD RIGHT ARM  Final   Special Requests   Final    BOTTLES DRAWN AEROBIC AND ANAEROBIC Blood Culture results may not be optimal due to an excessive volume of blood received in culture bottles   Culture   Final    NO GROWTH 5 DAYS Performed at [facility], [place] [address], [place],  [address]    Report Status 04/25/2021 FINAL  Final  Resp Panel by RT-PCR (Flu A&B, Covid) Nasopharyngeal Swab     Status: None   Collection Time: 04/20/21  5:55 PM   Specimen: Nasopharyngeal Swab; Nasopharyngeal(NP) swabs in vial transport medium  Result Value Ref Range Status   SARS Coronavirus 2 by RT PCR NEGATIVE NEGATIVE Final    Comment: (NOTE) SARS-CoV-2 target nucleic acids are NOT DETECTED.  The SARS-CoV-2 RNA is generally detectable in upper respiratory specimens during the acute phase of infection. The lowest concentration of SARS-CoV-2 viral copies this assay can detect is 138 copies/mL. A negative result does not preclude SARS-Cov-2 infection and should not be used as the sole basis for treatment or other patient management decisions. A negative result may occur with  improper specimen collection/handling, submission of specimen other than nasopharyngeal swab, presence of viral mutation(s) within the areas targeted by this assay, and inadequate number of viral copies(<138 copies/mL). A negative result must be combined with clinical observations, patient history, and epidemiological information. The expected result is Negative.  Fact Sheet for Patients:  EntrepreneurPulse.com.au  Fact Sheet for Healthcare Providers:  IncredibleEmployment.be  This test is no t yet approved or cleared by the [place] FDA and  has been authorized for detection and/or diagnosis of SARS-CoV-2 by FDA under an Emergency Use Authorization (EUA). This EUA will remain  in effect (meaning this test can be used) for the duration of the COVID-19 declaration under Section 564(b)(1) of the Act, 21 U.S.C.section 360bbb-3(b)(1), unless the authorization is terminated  or revoked sooner.       Influenza A by PCR NEGATIVE NEGATIVE Final   Influenza B by PCR NEGATIVE NEGATIVE Final    Comment: (NOTE) The Xpert Xpress SARS-CoV-2/FLU/RSV plus assay is intended as an aid in the diagnosis of influenza from Nasopharyngeal swab specimens and should not be used as a sole basis for treatment. Nasal washings and aspirates are unacceptable for Xpert Xpress SARS-CoV-2/FLU/RSV testing.  Fact Sheet for Patients: EntrepreneurPulse.com.au  Fact Sheet for Healthcare Providers: IncredibleEmployment.be  This test is not yet approved or cleared by the [place] FDA and has been authorized for detection and/or diagnosis of SARS-CoV-2 by FDA under an Emergency Use Authorization (EUA). This EUA will remain in effect (meaning this test can be used) for the duration of the COVID-19 declaration under Section 564(b)(1) of the Act, 21 U.S.C. section 360bbb-3(b)(1), unless the authorization is terminated or revoked.  Performed at [facility], [place] [address] [place], [place] [address]   Culture, blood (x 2)     Status: None   Collection Time: 04/20/21  9:07 PM   Specimen: BLOOD LEFT FOREARM  Result Value Ref Range Status   Specimen Description   Final    BLOOD LEFT FOREARM Performed at [facility], [place] [address], [place], [place] [address]    Special Requests   Final     BOTTLES DRAWN AEROBIC ONLY Blood Culture adequate volume Performed at [place] [address], [place], [place] [address]    Culture   Final    NO GROWTH 5 DAYS Performed at [facility], [place] [address] [place], [place] [address]    Report Status 04/25/2021 FINAL  Final  Culture, blood (x 2)     Status: None   Collection Time: 04/20/21  9:07 PM   Specimen: BLOOD RIGHT FOREARM  Result Value Ref Range Status   Specimen Description   Final    BLOOD RIGHT FOREARM Performed at [facility], [place] [address], [place], [place] [address]    Special Requests   Final    BOTTLES DRAWN AEROBIC ONLY Blood Culture adequate volume Performed at [place] [address], [place], [place] [address]    Culture   Final    NO GROWTH 5 DAYS Performed at [facility], [place] [address] [place],  [address]    Report Status 04/25/2021 FINAL  Final  Stool culture (children & immunocomp patients)     Status: None (Preliminary result)   Collection Time: 04/21/21  4:55 AM   Specimen: Stool  Result Value Ref Range Status   Salmonella/Shigella Screen Final report  Final   Campylobacter Culture PENDING  Incomplete   E coli, Shiga toxin Assay Negative Negative Final    Comment: (NOTE) Performed At: [facility] [place], [place] [id number] Rush Farmer MD [id number]   C Difficile Quick Screen w PCR reflex     Status: None   Collection Time: 04/21/21  4:55 AM  Specimen: Stool  Result Value Ref Range Status   C Diff antigen NEGATIVE NEGATIVE Final   C Diff toxin NEGATIVE NEGATIVE Final   C Diff interpretation No C. difficile detected.  Final    Comment: Performed at [facility], [place] [address] [place], [place] [address]  STOOL CULTURE REFLEX - RSASHR     Status: None   Collection Time: 04/21/21  4:55 AM  Result Value Ref Range Status   Stool Culture result 1 (RSASHR) Comment  Final     Comment: (NOTE) No Salmonella or Shigella recovered. Performed At: [facility] [place], [place] [id number] Rush Farmer MD AO:[id number]      Labs: BNP (last 3 results) No results for input(s): BNP in the last 8760 hours. Basic Metabolic Panel: Recent Labs  Lab 04/21/21 0456 04/22/21 0505 04/23/21 0430 04/24/21 0552 04/25/21 0423  NA 132* 130* 133* 132* 136  K 3.5 3.0* 3.5 3.3* 3.3*  CL 101 103 103 102 102  CO2 20* 20* 20* 21* 23  GLUCOSE 118* 119* 101* 109* 101*  BUN 22* 20 19 13 12   CREATININE 1.52* 1.42* 1.38* 1.05* 0.94  CALCIUM 7.7* 7.4* 8.3* 8.1* 8.7*  MG  --   --  1.8  --  1.6*   Liver Function Tests: Recent Labs  Lab 04/20/21 1459 04/21/21 0456  AST 14* 16  ALT 22 21  ALKPHOS 57 55  BILITOT 1.2 0.9  PROT 6.4* 5.8*  ALBUMIN 3.4* 2.8*   No results for input(s): LIPASE, AMYLASE in the last 168 hours. No results for input(s): AMMONIA in the last 168 hours. CBC: Recent Labs  Lab 04/20/21 1459 04/21/21 0456 04/22/21 0505 04/22/21 2038 04/23/21 0430 04/23/21 2051 04/24/21 0552 04/25/21 0423  WBC 0.2* 0.2* 0.4*  --  2.7*  --  9.1 14.9*  NEUTROABS 0.0*  --  0.2*  --   --   --  6.7 9.1*  HGB 8.9* 7.6* 6.7* 7.0* 7.0* 7.8* 7.4* 8.8*  HCT 25.9* 22.7* 20.1* 20.6* 21.1* 22.7* 21.5* 25.7*  MCV 90.6 93.0 92.2  --  92.1  --  91.1 92.1  PLT 58* 49* 45*  --  60*  --  103* 134*   Cardiac Enzymes: No results for input(s): CKTOTAL, CKMB, CKMBINDEX, TROPONINI in the last 168 hours. BNP: Invalid input(s): POCBNP CBG: Recent Labs  Lab 04/23/21 1202 04/23/21 1757 04/23/21 2122 04/24/21 0722 04/24/21 1141  GLUCAP 90 113* 108* 106* 102*   D-Dimer No results for input(s): DDIMER in the last 72 hours. Hgb A1c No results for input(s): HGBA1C in the last 72 hours. Lipid Profile No results for input(s): CHOL, HDL, LDLCALC, TRIG, CHOLHDL, LDLDIRECT in the last 72 hours. Thyroid function studies No results for input(s): TSH, T4TOTAL,  T3FREE, THYROIDAB in the last 72 hours.  Invalid input(s): FREET3 Anemia work up No results for input(s): VITAMINB12, FOLATE, FERRITIN, TIBC, IRON, RETICCTPCT in the last 72 hours. Urinalysis    Component Value Date/Time   COLORURINE YELLOW 04/20/2021 1505   APPEARANCEUR HAZY (A) 04/20/2021 1505   LABSPEC 1.015 04/20/2021 1505   PHURINE 5.0 04/20/2021 1505   GLUCOSEU NEGATIVE 04/20/2021 1505   HGBUR MODERATE (A) 04/20/2021 1505   BILIRUBINUR NEGATIVE 04/20/2021 1505   KETONESUR NEGATIVE 04/20/2021 1505   PROTEINUR NEGATIVE 04/20/2021 1505   UROBILINOGEN 0.2 10/23/2010 2031   NITRITE NEGATIVE 04/20/2021 1505   LEUKOCYTESUR NEGATIVE 04/20/2021 1505   Sepsis Labs Invalid input(s): PROCALCITONIN,  WBC,  LACTICIDVEN Microbiology Recent Results (  from the past 240 hour(s))  Culture, Blood     Status: None   Collection Time: 04/20/21  2:59 PM   Specimen: BLOOD  Result Value Ref Range Status   Specimen Description   Final    BLOOD Performed at [facility], [address], [place], [place] [address]    Special Requests   Final    PORTA CATH Performed at [facility], [place] [address] [place], [place] [address]    Culture   Final    NO GROWTH 5 DAYS Performed at [facility], [place] [address] [place], [place] [address]    Report Status 04/25/2021 FINAL  Final  Urine Culture     Status: Abnormal   Collection Time: 04/20/21  3:05 PM   Specimen: Urine, Clean Catch  Result Value Ref Range Status   Specimen Description   Final    URINE, CLEAN CATCH Performed at [facility], [place] [address] [place], [place] [address]    Special Requests   Final    NONE Performed at [facility], [place] [address] [place], [place] [address]    Culture MULTIPLE SPECIES PRESENT, SUGGEST RECOLLECTION (A)  Final   Report Status 04/22/2021 FINAL  Final  Culture, Blood     Status: None   Collection  Time: 04/20/21  3:45 PM   Specimen: BLOOD RIGHT ARM  Result Value Ref Range Status   Specimen Description BLOOD RIGHT ARM  Final   Special Requests   Final    BOTTLES DRAWN AEROBIC AND ANAEROBIC Blood Culture results may not be optimal due to an excessive volume of blood received in culture bottles   Culture   Final    NO GROWTH 5 DAYS Performed at [facility], [place] [address], [place], [place] [address]    Report Status 04/25/2021 FINAL  Final  Resp Panel by RT-PCR (Flu A&B, Covid) Nasopharyngeal Swab     Status: None   Collection Time: 04/20/21  5:55 PM   Specimen: Nasopharyngeal Swab; Nasopharyngeal(NP) swabs in vial transport medium  Result Value Ref Range Status   SARS Coronavirus 2 by RT PCR NEGATIVE NEGATIVE Final    Comment: (NOTE) SARS-CoV-2 target nucleic acids are NOT DETECTED.  The SARS-CoV-2 RNA is generally detectable in upper respiratory specimens during the acute phase of infection. The lowest concentration of SARS-CoV-2 viral copies this assay can detect is 138 copies/mL. A negative result does not preclude SARS-Cov-2 infection and should not be used as the sole basis for treatment or other patient management decisions. A negative result may occur with  improper specimen collection/handling, submission of specimen other than nasopharyngeal swab, presence of viral mutation(s) within the areas targeted by this assay, and inadequate number of viral copies(<138 copies/mL). A negative result must be combined with clinical observations, patient history, and epidemiological information. The expected result is Negative.  Fact Sheet for Patients:  EntrepreneurPulse.com.au  Fact Sheet for Healthcare Providers:  IncredibleEmployment.be  This test is no t yet approved or cleared by the [place] FDA and  has been authorized for detection and/or diagnosis of SARS-CoV-2 by FDA under an Emergency Use Authorization (EUA). This EUA  will remain  in effect (meaning this test can be used) for the duration of the COVID-19 declaration under Section 564(b)(1) of the Act, 21 U.S.C.section 360bbb-3(b)(1), unless the authorization is terminated  or revoked sooner.       Influenza A by PCR  NEGATIVE NEGATIVE Final   Influenza B by PCR NEGATIVE NEGATIVE Final    Comment: (NOTE) The Xpert Xpress SARS-CoV-2/FLU/RSV plus assay is intended as an aid in the diagnosis of influenza from Nasopharyngeal swab specimens and should not be used as a sole basis for treatment. Nasal washings and aspirates are unacceptable for Xpert Xpress SARS-CoV-2/FLU/RSV testing.  Fact Sheet for Patients: EntrepreneurPulse.com.au  Fact Sheet for Healthcare Providers: IncredibleEmployment.be  This test is not yet approved or cleared by the [place] FDA and has been authorized for detection and/or diagnosis of SARS-CoV-2 by FDA under an Emergency Use Authorization (EUA). This EUA will remain in effect (meaning this test can be used) for the duration of the COVID-19 declaration under Section 564(b)(1) of the Act, 21 U.S.C. section 360bbb-3(b)(1), unless the authorization is terminated or revoked.  Performed at [facility], [place] [address] [place], [place] [address]   Culture, blood (x 2)     Status: None   Collection Time: 04/20/21  9:07 PM   Specimen: BLOOD LEFT FOREARM  Result Value Ref Range Status   Specimen Description   Final    BLOOD LEFT FOREARM Performed at [facility], [place] [address], [place], [place] [address]    Special Requests   Final    BOTTLES DRAWN AEROBIC ONLY Blood Culture adequate volume Performed at [place] [address] [place], [place] [address]    Culture   Final    NO GROWTH 5 DAYS Performed at [facility], [place] [address] [place], [place] [address]    Report Status 04/25/2021 FINAL  Final  Culture, blood (x 2)      Status: None   Collection Time: 04/20/21  9:07 PM   Specimen: BLOOD RIGHT FOREARM  Result Value Ref Range Status   Specimen Description   Final    BLOOD RIGHT FOREARM Performed at [facility], [place] [address] [place], [place] [address]    Special Requests   Final    BOTTLES DRAWN AEROBIC ONLY Blood Culture adequate volume Performed at [place] [address] [place], [place] [address]    Culture   Final    NO GROWTH 5 DAYS Performed at [facility], [place] [address], [place], [place] [address]    Report Status 04/25/2021 FINAL  Final  Stool culture (children & immunocomp patients)     Status: None (Preliminary result)   Collection Time: 04/21/21  4:55 AM   Specimen: Stool  Result Value Ref Range Status   Salmonella/Shigella Screen Final report  Final   Campylobacter Culture PENDING  Incomplete   E coli, Shiga toxin Assay Negative Negative Final    Comment: (NOTE) Performed At: [place], [place] [id number] Rush Farmer MD [id number]   C Difficile Quick Screen w PCR reflex     Status: None   Collection Time: 04/21/21  4:55 AM   Specimen: Stool  Result Value Ref Range Status   C Diff antigen NEGATIVE NEGATIVE Final   C Diff toxin NEGATIVE NEGATIVE Final   C Diff interpretation No C. difficile detected.  Final    Comment: Performed at [facility], [place] [address], [place], [place] [address]  STOOL CULTURE REFLEX - RSASHR     Status: None   Collection Time: 04/21/21  4:55 AM  Result Value Ref Range Status   Stool Culture result 1 (RSASHR) Comment  Final  Comment: (NOTE) No Salmonella or Shigella recovered. Performed At: [facility] [place], [place] [id number] Rush Farmer MD [id number]      Time coordinating discharge: Over 30 minutes  SIGNED:   Donnamarie Poag [place] DO  [facility] 04/25/2021, 10:22 AM

## 2021-04-25 NOTE — Progress Notes (Signed)
SATURATION QUALIFICATIONS: (This note is used to comply with regulatory documentation for home oxygen)  Patient Saturations on Room Air at Rest = 88%  Patient Saturations on Room Air while Ambulating = 80%  Patient Saturations on 2 Liters of oxygen while Ambulating = 96%  Please briefly explain why patient needs home oxygen: Patient desaturates without 02.

## 2021-04-26 ENCOUNTER — Encounter: Payer: Self-pay | Admitting: *Deleted

## 2021-04-26 ENCOUNTER — Other Ambulatory Visit: Payer: Self-pay | Admitting: *Deleted

## 2021-04-26 LAB — GLUCOSE, CAPILLARY: Glucose-Capillary: 100 mg/dL — ABNORMAL HIGH (ref 70–99)

## 2021-04-26 NOTE — Patient Outreach (Signed)
[facility]) Care Management  04/26/2021  Leslie Wright 08/29/1966 [id number]   Transition of care call/case closure   Referral received:04/24/21 Initial outreach:04/26/21 Insurance: [employer]    Subjective: Initial successful telephone call to patient's preferred number in order to complete transition of care assessment; 2 HIPAA identifiers verified. Explained purpose of call and completed transition of care assessment.  Leslie Wright states that she is feeling better on today. She denies having fever, body aches. She reports having nonproductive cough , denies shortness of breath she reports continues wearing oxygen at 2 liters. Family member plans to purchase pulse oximeter. She reports tolerating diet,improved appetite and intake on today, reinforced staying hydrated. She reports having loose stool but states under control, denies bladder problems. Patient has arranged to stay with a friend , she also has neices/nephew support to assist in her  recovery.   Reviewed accessing the following [employer] Benefits : She discussed ongoing health issues and says she will enroll in program if she decides to [place] chronic disease management programs.  She is unsure if she has the hospital indemnity plan, provided contact number to [employer] to file a claim  She uses a [facility] at [facility]     Objective:  Leslie Wright was hospitalized at [facility] 7/21-7/26  for Neutropenic fever, body aches.  Comorbidities include: Right and left breast cancer chemo therapy, Pre Diabetes, hypertension, hyperlipidemia, ckd, GERD , s/p left total hip replacment  She was discharged to home on 04/25/21  without the need for home health services, provided DME of home oxygen at 2 liters.    Assessment:  Patient voices good understanding of all discharge instructions.  See transition of care flowsheet for assessment details.   Plan:  Reviewed hospital discharge  diagnosis of Neutropenic fever   and discharge treatment plan using hospital discharge instructions, assessing medication adherence, reviewing problems requiring provider notification, and discussing the importance of follow up with surgeon, primary care provider and/or specialists as directed.  Reviewed [employer] healthy lifestyle program information to receive discounted premium for  2023   Step 1: Get  your annual physical  Step 2: Complete your health assessment  Step 3:Identify your current health status and complete the corresponding action step between October 01, 2020 and June 01, 2021.     No ongoing care management needs identified so will close case to [place] [facility] services and route successful outreach letter with [facility] [facility] pamphlet and 24 Hour Nurse Line Magnet to [facility] clinical pool to be mailed to patient's home address.  Thanked patient for their services to [facility].  [name], RN, BSN  [place] Management Coordinator  [phone]- Mobile [phone]- Toll Free Main Office

## 2021-04-27 ENCOUNTER — Telehealth: Payer: Self-pay | Admitting: Oncology

## 2021-04-27 ENCOUNTER — Other Ambulatory Visit (HOSPITAL_COMMUNITY): Payer: Self-pay

## 2021-04-27 DIAGNOSIS — Z1159 Encounter for screening for other viral diseases: Secondary | ICD-10-CM | POA: Diagnosis not present

## 2021-04-27 DIAGNOSIS — J309 Allergic rhinitis, unspecified: Secondary | ICD-10-CM | POA: Diagnosis not present

## 2021-04-27 DIAGNOSIS — D709 Neutropenia, unspecified: Secondary | ICD-10-CM | POA: Diagnosis not present

## 2021-04-27 DIAGNOSIS — J069 Acute upper respiratory infection, unspecified: Secondary | ICD-10-CM | POA: Diagnosis not present

## 2021-04-27 MED ORDER — AZELASTINE HCL 0.1 % NA SOLN
NASAL | 11 refills | Status: DC
  Filled 2021-04-27: qty 30, 30d supply, fill #0

## 2021-04-27 MED ORDER — BENZONATATE 200 MG PO CAPS
ORAL_CAPSULE | ORAL | 0 refills | Status: DC
  Filled 2021-04-27: qty 60, 20d supply, fill #0

## 2021-04-27 NOTE — Telephone Encounter (Signed)
Cancelled appts per 7/27 sch msg. Pt aware.

## 2021-04-28 ENCOUNTER — Ambulatory Visit: Payer: 59 | Admitting: Adult Health

## 2021-04-28 ENCOUNTER — Other Ambulatory Visit: Payer: 59

## 2021-04-28 ENCOUNTER — Encounter: Payer: Self-pay | Admitting: *Deleted

## 2021-04-28 ENCOUNTER — Ambulatory Visit: Payer: 59

## 2021-05-03 NOTE — Progress Notes (Signed)
[place]  [phone] [phone]    ID: Leslie Wright DOB: [birth year]  MR#: [id number]  ZHY#:[id number]  Patient Care Team: Fanny Bien, MD as PCP - General (Family Medicine) Herminio Commons, MD (Inactive) as PCP - Cardiology (Cardiology) Mauro Kaufmann, RN as Oncology Nurse Navigator Rockwell Germany, RN as Oncology Nurse Navigator Stark Klein, MD as Consulting Physician (General Surgery) Kyung Rudd, MD as Consulting Physician (Radiation Oncology) Scot Dock, NP OTHER MD:  CHIEF COMPLAINT: Bilateral breast cancers  CURRENT TREATMENT: Neoadjuvant chemotherapy   INTERVAL HISTORY: Leslie Wright returns today for follow up and treatment of her bilateral breast cancers.   She began neoadjuvant chemotherapy, consisting of pembrolizumab, carboplatin, and paclitaxel, on 02/10/2021.  She developed peripheral neuropathy and stopped the Paclitaxel/Carboplatin early.    She began Doxorubicin and cyclophosphamide, along with Pembrolizumab and on her day 8 visit she developed febrile neutropenia and was hospitalized from 04/20/2021 through 04/25/2021.    During her hospitalization she received antibiotics intravenously and was discharged with Levaquin.  She was transfused with 3 units of PRBCs and her hemoglobin was 8.8 at discharge.  She also had an oxygen desaturation of 80% on room air, and was discharged on 2 L [place] oxygen.    Today Leslie Wright notes that she is slowly improving, and slowly regaining her stregnth.  She is temporarily living at home with her brother due to her deconditioning from her hospitalization.  She is as active as she can be, but notes having to take deep breaths more frequently, and getting fatigued more easily.  She continues on oxygen.  The only other breathing issues she has had is sleep apnea.  She wears a cpap at night, but notes that while she is asleep she takes the cpap off and turns it off without knowing.    REVIEW OF  SYSTEMS: Review of Systems  Constitutional:  Positive for fatigue. Negative for appetite change, chills, fever and unexpected weight change.  HENT:   Negative for hearing loss, lump/mass and trouble swallowing.   Eyes:  Negative for eye problems and icterus.  Respiratory:  Negative for chest tightness, cough and shortness of breath.   Cardiovascular:  Negative for chest pain, leg swelling and palpitations.  Gastrointestinal:  Positive for diarrhea. Negative for abdominal distention, abdominal pain, constipation, nausea and vomiting.  Endocrine: Negative for hot flashes.  Genitourinary:  Negative for difficulty urinating.   Musculoskeletal:  Negative for arthralgias.  Skin:  Negative for itching and rash.  Neurological:  Positive for numbness. Negative for dizziness, extremity weakness and headaches.  Hematological:  Negative for adenopathy. Does not bruise/bleed easily.  Psychiatric/Behavioral:  Negative for depression. The patient is not nervous/anxious.       COVID 19 VACCINATION STATUS: Status post [place] x2, no booster as of 01/24/2021   HISTORY OF CURRENT ILLNESS: From the original intake note:  Leslie Wright" presented with a palpable left breast lump. She underwent bilateral diagnostic mammography with tomography and bilateral breast ultrasonography at [facility] on 01/12/2021 showing: breast density category C; 5.8 cm mass in the left breast at 12:30 with associated calcifications; additional 1.2 cm group of calcifications in the outer left breast;   On the right: Multiple small masses and irregular ducts in the retroareolar right breast, including a 0.8 cm representative mass at 6 o'clock; indeterminate 0.4 cm mass in upper-inner right breast; normal lymph nodes bilaterally.  Accordingly on 01/16/2021 she proceeded to biopsy of the right breast  areas in question. The pathology from this procedure ([id number]) showed:  1. Right Breast, 6 o'clock  - invasive ductal  carcinoma, grade 2. Prognostic indicators significant for: estrogen receptor, 90% positive with strong staining intensity and progesterone receptor, 0% negative. Proliferation marker Ki67 at 5%. HER2 equivocal by immunohistochemistry (2+), but negative by fluorescent in situ hybridization with a signals ratio 1.61 and number per cell 2.5. 2. Right Breast, 9 o'clock  - fibrocystic change with adenosis, calcifications, and a small intraductal papilloma.  She underwent biopsy of the left breast areas on 01/20/2021. Pathology [id number]) revealed: 1. Left Breast, upper-outer quadrant  - invasive ductal carcinoma, grade 3 2. Left Breast, central, slightly lateral to midline  - ductal carcinoma in situ with necrosis and calcifications, intermediate grade Prognostic panels are pending on both samples.  Cancer Staging Malignant neoplasm of lower-outer quadrant of right breast of female, estrogen receptor positive ([place]) Staging form: Breast, AJCC 8th Edition - Clinical stage from 01/20/2021: Stage IA (cT1c, cN0, cM0, G2, ER+, PR-, HER2-) - Signed by Chauncey Cruel, MD on 01/25/2021 Stage prefix: Initial diagnosis Histologic grading system: 3 grade system  Malignant neoplasm of overlapping sites of left breast in female, estrogen receptor negative ([place]) Staging form: Breast, AJCC 8th Edition - Clinical stage from 01/20/2021: Stage IIIB (cT3, cN0, cM0, G3, ER-, PR-, HER2-) - Signed by Chauncey Cruel, MD on 01/25/2021 Histologic grading system: 3 grade system  The patient's subsequent history is as detailed below.   PAST MEDICAL HISTORY: Past Medical History:  Diagnosis Date   Acid reflux    Anemia    Arthritis    "maybe in my fingers" (03/26/2018)   Breast cancer ([place]) 12/2020   both sides   Concussion 2001   no deficit had tia right after no problems since   Family history of adverse reaction to anesthesia    "brother, Leslie Wright, and my mom always get sick" (03/26/2018)   Family history of  brain cancer    Family history of breast cancer    Family history of colon cancer    Family history of lung cancer    Family history of pancreatic cancer    Family history of prostate cancer    Headache    High blood pressure    High cholesterol    Hip pain    "left; before OR" (03/26/2018)   History of chemotherapy last done 02-17-2021   History of hiatal hernia    History of kidney stones    OSA (obstructive sleep apnea)    "can't tolerate the mask" (03/26/2018)    PONV (postoperative nausea and vomiting)    does not need much anesthesia, slow to awlaekn in past   Pre-diabetes    Shortness of breath    with exertion   Wears partial dentures upper    PAST SURGICAL HISTORY: Past Surgical History:  Procedure Laterality Date   CARPAL TUNNEL RELEASE Right ~ 2006   JOINT REPLACEMENT     LAPAROSCOPIC CHOLECYSTECTOMY  12/2010   LASIK Bilateral 2000   PORT A CATH REVISION Left 02/21/2021   Procedure: PORT A CATH REVISION;  Surgeon: Stark Klein, MD;  Location: [place];  Service: General;  Laterality: Left;  60 MINUTES ROOM 5   PORTACATH PLACEMENT N/A 02/08/2021   Procedure: INSERTION PORT-A-CATH;  Surgeon: Stark Klein, MD;  Location: [facility];  Service: General;  Laterality: N/A;   TOTAL HIP ARTHROPLASTY Left 03/25/2018   Procedure: LEFT TOTAL HIP ARTHROPLASTY ANTERIOR APPROACH;  Surgeon: Mcarthur Rossetti, MD;  Location: [facility];  Service: Orthopedics;  Laterality: Left;    FAMILY HISTORY: Family History  Problem Relation Age of Onset   Diabetes Mother    Thyroid disease Mother    Liver disease Mother    Obesity Mother    Hypertension Father    Hyperlipidemia Father    Heart disease Father    Stroke Father    Obesity Father    Colon polyps Father    Prostate cancer Brother    Brain cancer Maternal Grandfather    Pancreatic cancer Paternal Grandmother    Colon cancer Neg Hx        No one in family with colon cancer.  The patient's father is 56 years  old as of 02-01-21.  The patient's mother died from cirrhosis associated with hepatitis C (from a transfusion) age 61.  The patient has 3 brothers, no sisters.  1 brother has a history of prostate cancer at age 47.  A maternal grandfather had a history of central nervous system carcinoma.  A paternal grandmother had a history of pancreatic cancer and a maternal aunt had a history of breast cancer in her late 30s.   GYNECOLOGIC HISTORY:  No LMP recorded (lmp unknown). Patient is postmenopausal. Menarche: 55 years old [place] P 0 LMP 53 HRT no  Hysterectomy? no BSO? no   SOCIAL HISTORY: (updated 02-01-2021)  Rosaria Ferries "Leslie Wright" works for [employer] in [employer].  She works from home.  She is a Engineer, mining.  She describes herself as single, lives by herself, with a toy poodle and a [place] as well as 2 cats.  ADVANCED DIRECTIVES: The patient's brother Tineshia Becraft is her healthcare power of attorney.  He can be reached at [phone]   HEALTH MAINTENANCE: Social History   Tobacco Use   Smoking status: Former    Types: Cigarettes   Smokeless tobacco: Never   Tobacco comments:    In teens social  Vaping Use   Vaping Use: Never used  Substance Use Topics   Alcohol use: Yes    Comment: Occasional   Drug use: Never     Colonoscopy:   PAP: 03/2011  Bone density: scheduled for 07/2021   No Known Allergies  Current Outpatient Medications  Medication Sig Dispense Refill   acetaminophen (TYLENOL) 500 MG tablet Take 1,000 mg by mouth every 6 (six) hours as needed for moderate pain.     aspirin EC 81 MG tablet Take 81 mg by mouth daily.     azelastine (ASTELIN) 0.1 % nasal spray Use 1 - 2 sprays each nostril 1 - 2 times a day for nasal congestion 30 mL 11   benzonatate (TESSALON) 200 MG capsule Take 1 capsule by mouth 3 times a day when needed for cough 60 capsule 0   betamethasone valerate ointment (VALISONE) 0.1 % Apply 1 application topically 2 (two) times daily. (Patient taking  differently: Apply 1 application topically 2 (two) times daily as needed (rash).) 30 g 0   Blood Glucose Monitoring Suppl (FREESTYLE LITE) w/Device KIT CHECK GLUCOSE 2-3 TIMES DAILY 1 kit 0   Cholecalciferol (VITAMIN D) 50 MCG (2000 UT) CAPS Take 2,000 Units by mouth daily.     dexamethasone (DECADRON) 4 MG tablet Take 2 tablets once a day for 3 days after carboplatin and AC chemotherapy. Take with food. (Patient taking differently: Take 4 mg by mouth See admin instructions. Take 2 tablets once a day for 3 days after  carboplatin and AC chemotherapy. Take with food.) 30 tablet 1   folic acid (FOLVITE) 1 MG tablet Take 1 mg by mouth daily.     glucose blood test strip USE AS DIRECTED TO CHECK BLOOD SUGAR 1 TO 3 TIMES A DAY (Patient taking differently: USE AS DIRECTED TO CHECK BLOOD SUGAR 1 TO 3 TIMES A DAY) 100 strip 3   ketoconazole (NIZORAL) 2 % cream Apply 1 application topically daily. (Patient taking differently: Apply 1 application topically daily as needed for irritation.) 15 g 0   Lancets (FREESTYLE) lancets USE AS DIRECTED TO CHECK BLOOD SUGAR 2 TO 3 TIMES A DAY (Patient taking differently: USE AS DIRECTED TO CHECK BLOOD SUGAR 2 TO 3 TIMES A DAY) 100 each 3   lidocaine-prilocaine (EMLA) cream Apply to affected area once (Patient taking differently: Apply 1 application topically daily as needed (port access).) 30 g 3   losartan (COZAAR) 100 MG tablet Take 1 tablet by mouth daily (Patient taking differently: Take 50 mg by mouth daily.) 90 tablet 3   methotrexate (RHEUMATREX) 15 MG tablet Take 15 mg by mouth every Wednesday. Caution: Chemotherapy. Protect from light.     Multiple Vitamin (MULTIVITAMIN WITH MINERALS) TABS tablet Take 1 tablet by mouth daily.     omeprazole (PRILOSEC) 20 MG capsule Take 20 mg by mouth daily.     ondansetron (ZOFRAN) 8 MG tablet Take 1 tablet (8 mg total) by mouth 2 (two) times daily as needed. Start on the third day after carboplatin and AC chemotherapy. (Patient  taking differently: Take 8 mg by mouth 2 (two) times daily as needed for nausea or vomiting. Start on the third day after carboplatin and AC chemotherapy.) 30 tablet 1   prochlorperazine (COMPAZINE) 10 MG tablet Take 1 tablet (10 mg total) by mouth every 6 (six) hours as needed (Nausea or vomiting). (Patient taking differently: Take 10 mg by mouth every 6 (six) hours as needed for nausea or vomiting (Nausea or vomiting).) 30 tablet 1   rosuvastatin (CRESTOR) 10 MG tablet TAKE 1 TABLET BY MOUTH ONCE A DAY AT BEDTIME FOR CHOLESTEROL (Patient taking differently: Take 10 mg by mouth at bedtime.) 90 tablet 1   No current facility-administered medications for this visit.   Facility-Administered Medications Ordered in Other Visits  Medication Dose Route Frequency Provider Last Rate Last Admin   cyclophosphamide (CYTOXAN) 1,300 mg in sodium chloride 0.9 % 250 mL chemo infusion  500 mg/m2 (Treatment Plan Recorded) Intravenous Once Magrinat, Virgie Dad, MD       dexamethasone (DECADRON) 10 mg in sodium chloride 0.9 % 50 mL IVPB  10 mg Intravenous Once Magrinat, Virgie Dad, MD       DOXOrubicin (ADRIAMYCIN) chemo injection 130 mg  50 mg/m2 (Treatment Plan Recorded) Intravenous Once Magrinat, Virgie Dad, MD       fosaprepitant (EMEND) 150 mg in sodium chloride 0.9 % 145 mL IVPB  150 mg Intravenous Once Magrinat, Virgie Dad, MD       pembrolizumab [facility]) 200 mg in sodium chloride 0.9 % 50 mL chemo infusion  200 mg Intravenous Once Magrinat, Virgie Dad, MD        OBJECTIVE:   Vitals:   05/04/21 1050  BP: 107/72  Pulse: 100  Resp: 18  Temp: 97.7 F (36.5 C)  SpO2: 100%      Body mass index is 45.01 kg/m.   Wt Readings from Last 3 Encounters:  05/04/21 287 lb 6.4 oz (130.4 kg)  04/20/21 298 lb (135.2 kg)  04/20/21 298 lb 12.8 oz (135.5 kg)   GENERAL: Patient appears tired, and like she feels poorly HEENT:  Sclerae anicteric.  Oropharynx clear and moist. No ulcerations or evidence of oropharyngeal  candidiasis. Neck is supple.  NODES:  No cervical, supraclavicular, or axillary lymphadenopathy palpated.  BREAST EXAM:  Deferred. LUNGS:  Clear to auscultation bilaterally.  No wheezes or rhonchi. HEART:  Regular rate and rhythm. No murmur appreciated. ABDOMEN:  Soft, nontender.  Positive, normoactive bowel sounds. No organomegaly palpated. EXTREMITIES:  No peripheral edema.   SKIN:  Clear with no obvious rashes or skin changes. No nail dyscrasia. NEURO:  Nonfocal. Well oriented.  Appropriate affect.     LAB RESULTS:  CMP     Component Value Date/Time   NA 144 05/04/2021 1042   NA 143 12/02/2018 1138   K 3.9 05/04/2021 1042   CL 106 05/04/2021 1042   CO2 26 05/04/2021 1042   GLUCOSE 115 (H) 05/04/2021 1042   BUN 12 05/04/2021 1042   BUN 17 12/02/2018 1138   CREATININE 0.92 05/04/2021 1042   CREATININE 1.09 (H) 02/18/2019 1524   CALCIUM 9.8 05/04/2021 1042   PROT 7.1 05/04/2021 1042   PROT 6.9 12/02/2018 1138   ALBUMIN 2.8 (L) 05/04/2021 1042   ALBUMIN 4.0 12/02/2018 1138   AST 17 05/04/2021 1042   ALT 23 05/04/2021 1042   ALKPHOS 85 05/04/2021 1042   BILITOT 0.4 05/04/2021 1042   GFRNONAA >60 05/04/2021 1042   GFRAA 70 12/02/2018 1138    No results found for: TOTALPROTELP, ALBUMINELP, A1GS, A2GS, BETS, BETA2SER, GAMS, MSPIKE, SPEI  Lab Results  Component Value Date   WBC 12.1 (H) 05/04/2021   NEUTROABS 8.8 (H) 05/04/2021   HGB 11.0 (L) 05/04/2021   HCT 33.6 (L) 05/04/2021   MCV 94.6 05/04/2021   PLT 430 (H) 05/04/2021    No results found for: LABCA2  No components found for: [id number]  No results for input(s): INR in the last 168 hours.  No results found for: LABCA2  No results found for: WUJ811  No results found for: BJY782  No results found for: NFA213  No results found for: CA2729  No components found for: HGQUANT  No results found for: CEA1 / No results found for: CEA1   No results found for: AFPTUMOR  No results found for:  CHROMOGRNA  No results found for: KPAFRELGTCHN, LAMBDASER, KAPLAMBRATIO (kappa/lambda light chains)  No results found for: HGBA, HGBA2QUANT, HGBFQUANT, HGBSQUAN (Hemoglobinopathy evaluation)   Lab Results  Component Value Date   LDH 283 (H) 04/23/2021    No results found for: IRON, TIBC, IRONPCTSAT (Iron and TIBC)  No results found for: FERRITIN  Urinalysis    Component Value Date/Time   COLORURINE YELLOW 04/20/2021 1505   APPEARANCEUR HAZY (A) 04/20/2021 1505   LABSPEC 1.015 04/20/2021 1505   PHURINE 5.0 04/20/2021 1505   GLUCOSEU NEGATIVE 04/20/2021 1505   HGBUR MODERATE (A) 04/20/2021 1505   BILIRUBINUR NEGATIVE 04/20/2021 [place] 04/20/2021 1505   PROTEINUR NEGATIVE 04/20/2021 1505   UROBILINOGEN 0.2 10/23/2010 2031   NITRITE NEGATIVE 04/20/2021 1505   LEUKOCYTESUR NEGATIVE 04/20/2021 1505    STUDIES: DG Chest 2 View  Result Date: 04/20/2021 CLINICAL DATA:  [facility] - work up [employer] for possible infection due to chemotherapy EXAM: CHEST - 2 VIEW COMPARISON:  Radiograph 02/21/2021, chest CT 02/15/2021 FINDINGS: Chest port catheter tip overlies the distal superior vena cava. Unchanged cardiomediastinal silhouette. There is no new focal airspace disease. No pleural  effusion or visible pneumothorax. No acute osseous abnormality. IMPRESSION: No evidence of acute cardiopulmonary disease. Electronically Signed   By: Maurine Simmering   On: 04/20/2021 15:07   DG CHEST PORT 1 VIEW  Result Date: 04/24/2021 CLINICAL DATA:  Shortness of breath and dry cough, breast cancer on chemotherapy, neutropenic fever EXAM: PORTABLE CHEST 1 VIEW COMPARISON:  Radiograph 04/20/2021 FINDINGS: Markedly low lung volumes with some atelectatic changes. Diffuse hazy interstitial opacities are present throughout both lungs with more coalescent opacity seen in the right upper lung. Increasing prominence of the cardiac silhouette may reflect some diminished volumes and portable technique.  Pulmonary vascularity is indistinct. Tunneled left IJ approach Port-A-Cath tip terminates in the mid SVC, similar to prior. Port-A-Cath is currently accessed at this time. No discernible acute osseous or soft tissue abnormalities of chest wall. Degenerative changes are present in the imaged spine and shoulders. IMPRESSION: Diminishing lung volumes with interval development of diffuse hazy interstitial opacities bilaterally and more coalescent density in the right upper lobe. Findings could reflect pulmonary edema, acute infectious/inflammatory airspace disease, and/or ARDS. Prominence of the cardiac silhouette may be related to low volumes and portable technique. Electronically Signed   By: Lovena Le M.D.   On: 04/24/2021 04:01      ELIGIBLE FOR AVAILABLE RESEARCH PROTOCOL:   ASSESSMENT: 55 y.o. 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  (a) secondary biopsy shows ductal carcinoma in situ associated with extensive patchy enhancement  (3) staging studies:  (a) chest CT scan 02/06/2021 shows hepatic steatosis and degenerative disc disease, no evidence of metastatic disease  (b) bone scan 02/06/2021 shows no evidence of metastatic disease  (4) neoadjuvant chemotherapy will start 02/09/2021 consisting of pembrolizumab/carboplatin/paclitaxel stopped one cycle early due to peripheral neuropathy followed by  pembrolizumab/doxorubicin/cyclophosphamide  (5) definitive surgery to follow  (6) adjuvant radiation to bilateral sites  (7) Negative genetic testing on 02/13/2021. VUS in ATM called c.1132A>G identified.  Genes tested include: AIP, ALK, APC, ATM, AXIN2,BAP1,  BARD1, BLM, BMPR1A, BRCA1, BRCA2, BRIP1, CASR, CDC73, CDH1, CDK4, CDKN1B, CDKN1C, CDKN2A (p14ARF), CDKN2A (p16INK4a), CEBPA, CHEK2, CTNNA1, DICER1, DIS3L2, EGFR (c.2369C>T, p.Thr790Met variant only), EPCAM (Deletion/duplication testing only), FH, FLCN, GATA2, GPC3, GREM1 (Promoter region deletion/duplication testing only), HOXB13 (c.251G>A, p.Gly84Glu), HRAS, KIT, MAX, MEN1, MET, MITF (c.952G>A, p.Glu318Lys variant only), MLH1, MSH2, MSH3, MSH6, MUTYH, NBN, NF1, NF2, NTHL1, PALB2, PDGFRA, PHOX2B, PMS2, POLD1, POLE, POT1, PRKAR1A, PTCH1, PTEN, RAD50, RAD51C, RAD51D, RB1, RECQL4, RET, RUNX1, SDHAF2, SDHA (sequence changes only), SDHB, SDHC, SDHD, SMAD4, SMARCA4, SMARCB1, SMARCE1, STK11, SUFU, TERC, TERT, TMEM127, TP53, TSC1, TSC2, VHL, WRN and WT1.  PLAN:  Leslie Wright is slowly improving after her recent hospitalization for febrile neutropenia.  She met with myself and Dr. Jana Hakim to discuss her plan.  She is still somewhat weak from her recent hospitalization, however is improving.  She is walking more and her labs have recovered.  Her ambulatory oxygen saturation was 97% on room air.  She can discontinue her oxygen use.   Kay's improvement is such that she can proceed with her next cycle of Pembrolizumab, Doxorubicin, and Cyclophosphamide at a 15% dose reduction due to her febrile neutropenic episode.  She will return on 05/06/2021 for her Neulasta or biosimilar injection and on 05/10/2021 for labs and f/u with Dr. Jana Hakim.    She knows to call for any questions that may arise between now and her next appointment.  We are happy to see her sooner if needed.  Total encounter time 30 minutes in chart review, lab review, order entry, care coordination, face  to face visit time, and documentation of the encounter.    Wilber Bihari, NP 05/04/21 11:57 AM Medical Oncology and Hematology [facility] [place], [place] [address] Tel. [phone]    Fax. [phone]    *Total Encounter Time as defined by the Centers for Medicare and Medicaid Services includes, in addition to the face-to-face time of a patient visit (documented in the note above) non-face-to-face time: obtaining and reviewing outside history, ordering and reviewing medications, tests or procedures, care coordination (communications with other health care professionals or caregivers) and documentation in the medical record.

## 2021-05-04 ENCOUNTER — Inpatient Hospital Stay: Payer: 59

## 2021-05-04 ENCOUNTER — Other Ambulatory Visit: Payer: Self-pay

## 2021-05-04 ENCOUNTER — Encounter: Payer: Self-pay | Admitting: *Deleted

## 2021-05-04 ENCOUNTER — Inpatient Hospital Stay: Payer: 59 | Attending: Oncology | Admitting: Adult Health

## 2021-05-04 ENCOUNTER — Encounter: Payer: Self-pay | Admitting: Adult Health

## 2021-05-04 ENCOUNTER — Other Ambulatory Visit: Payer: 59

## 2021-05-04 VITALS — BP 107/72 | HR 100 | Temp 97.7°F | Resp 18 | Ht 67.0 in | Wt 287.4 lb

## 2021-05-04 DIAGNOSIS — Z5111 Encounter for antineoplastic chemotherapy: Secondary | ICD-10-CM | POA: Diagnosis not present

## 2021-05-04 DIAGNOSIS — Z171 Estrogen receptor negative status [ER-]: Secondary | ICD-10-CM | POA: Diagnosis not present

## 2021-05-04 DIAGNOSIS — R6883 Chills (without fever): Secondary | ICD-10-CM

## 2021-05-04 DIAGNOSIS — C50511 Malignant neoplasm of lower-outer quadrant of right female breast: Secondary | ICD-10-CM | POA: Diagnosis not present

## 2021-05-04 DIAGNOSIS — Z170 Estrogen receptor positive status [ER+]: Secondary | ICD-10-CM

## 2021-05-04 DIAGNOSIS — C50812 Malignant neoplasm of overlapping sites of left female breast: Secondary | ICD-10-CM

## 2021-05-04 DIAGNOSIS — L739 Follicular disorder, unspecified: Secondary | ICD-10-CM

## 2021-05-04 DIAGNOSIS — Z79899 Other long term (current) drug therapy: Secondary | ICD-10-CM | POA: Diagnosis not present

## 2021-05-04 DIAGNOSIS — D61810 Antineoplastic chemotherapy induced pancytopenia: Secondary | ICD-10-CM

## 2021-05-04 DIAGNOSIS — C50811 Malignant neoplasm of overlapping sites of right female breast: Secondary | ICD-10-CM | POA: Diagnosis not present

## 2021-05-04 DIAGNOSIS — K760 Fatty (change of) liver, not elsewhere classified: Secondary | ICD-10-CM

## 2021-05-04 DIAGNOSIS — M539 Dorsopathy, unspecified: Secondary | ICD-10-CM

## 2021-05-04 DIAGNOSIS — Z5112 Encounter for antineoplastic immunotherapy: Secondary | ICD-10-CM | POA: Insufficient documentation

## 2021-05-04 DIAGNOSIS — Z5189 Encounter for other specified aftercare: Secondary | ICD-10-CM | POA: Diagnosis not present

## 2021-05-04 DIAGNOSIS — Z95828 Presence of other vascular implants and grafts: Secondary | ICD-10-CM

## 2021-05-04 LAB — CMP (CANCER CENTER ONLY)
ALT: 23 U/L (ref 0–44)
AST: 17 U/L (ref 15–41)
Albumin: 2.8 g/dL — ABNORMAL LOW (ref 3.5–5.0)
Alkaline Phosphatase: 85 U/L (ref 38–126)
Anion gap: 12 (ref 5–15)
BUN: 12 mg/dL (ref 6–20)
CO2: 26 mmol/L (ref 22–32)
Calcium: 9.8 mg/dL (ref 8.9–10.3)
Chloride: 106 mmol/L (ref 98–111)
Creatinine: 0.92 mg/dL (ref 0.44–1.00)
GFR, Estimated: >60 mL/min (ref >60)
Glucose, Bld: 115 mg/dL — ABNORMAL HIGH (ref 70–99)
Potassium: 3.9 mmol/L (ref 3.5–5.1)
Sodium: 144 mmol/L (ref 135–145)
Total Bilirubin: 0.4 mg/dL (ref 0.3–1.2)
Total Protein: 7.1 g/dL (ref 6.5–8.1)

## 2021-05-04 LAB — CBC WITH DIFFERENTIAL (CANCER CENTER ONLY)
Abs Immature Granulocytes: 0.34 10*3/uL — ABNORMAL HIGH (ref 0.00–0.07)
Basophils Absolute: 0.2 10*3/uL — ABNORMAL HIGH (ref 0.0–0.1)
Basophils Relative: 1 %
Eosinophils Absolute: 0 10*3/uL (ref 0.0–0.5)
Eosinophils Relative: 0 %
HCT: 33.6 % — ABNORMAL LOW (ref 36.0–46.0)
Hemoglobin: 11 g/dL — ABNORMAL LOW (ref 12.0–15.0)
Immature Granulocytes: 3 %
Lymphocytes Relative: 12 %
Lymphs Abs: 1.5 10*3/uL (ref 0.7–4.0)
MCH: 31 pg (ref 26.0–34.0)
MCHC: 32.7 g/dL (ref 30.0–36.0)
MCV: 94.6 fL (ref 80.0–100.0)
Monocytes Absolute: 1.3 10*3/uL — ABNORMAL HIGH (ref 0.1–1.0)
Monocytes Relative: 11 %
Neutro Abs: 8.8 10*3/uL — ABNORMAL HIGH (ref 1.7–7.7)
Neutrophils Relative %: 73 %
Platelet Count: 430 10*3/uL — ABNORMAL HIGH (ref 150–400)
RBC: 3.55 MIL/uL — ABNORMAL LOW (ref 3.87–5.11)
RDW: 17.2 % — ABNORMAL HIGH (ref 11.5–15.5)
WBC Count: 12.1 10*3/uL — ABNORMAL HIGH (ref 4.0–10.5)
nRBC: 0.3 % — ABNORMAL HIGH (ref 0.0–0.2)

## 2021-05-04 LAB — TSH: TSH: 0.949 u[IU]/mL (ref 0.308–3.960)

## 2021-05-04 LAB — T4, FREE: Free T4: 0.9 ng/dL (ref 0.61–1.12)

## 2021-05-04 MED ORDER — HEPARIN SOD (PORK) LOCK FLUSH 100 UNIT/ML IV SOLN
500.0000 [IU] | Freq: Once | INTRAVENOUS | Status: AC | PRN
  Administered 2021-05-04: 500 [IU]
  Filled 2021-05-04: qty 5

## 2021-05-04 MED ORDER — ALTEPLASE 2 MG IJ SOLR
2.0000 mg | Freq: Once | INTRAMUSCULAR | Status: AC
Start: 2021-05-04 — End: 2021-05-04
  Administered 2021-05-04: 2 mg
  Filled 2021-05-04: qty 2

## 2021-05-04 MED ORDER — SODIUM CHLORIDE 0.9 % IV SOLN
150.0000 mg | Freq: Once | INTRAVENOUS | Status: AC
  Administered 2021-05-04: 150 mg via INTRAVENOUS
  Filled 2021-05-04: qty 150
  Filled 2021-05-04: qty 5

## 2021-05-04 MED ORDER — PALONOSETRON HCL INJECTION 0.25 MG/5ML
0.2500 mg | Freq: Once | INTRAVENOUS | Status: AC
  Administered 2021-05-04: 0.25 mg via INTRAVENOUS

## 2021-05-04 MED ORDER — SODIUM CHLORIDE 0.9 % IV SOLN
Freq: Once | INTRAVENOUS | Status: AC
  Filled 2021-05-04: qty 250

## 2021-05-04 MED ORDER — SODIUM CHLORIDE 0.9 % IV SOLN
10.0000 mg | Freq: Once | INTRAVENOUS | Status: AC
  Administered 2021-05-04: 10 mg via INTRAVENOUS
  Filled 2021-05-04: qty 10
  Filled 2021-05-04: qty 1

## 2021-05-04 MED ORDER — DOXORUBICIN HCL CHEMO IV INJECTION 2 MG/ML
50.0000 mg/m2 | Freq: Once | INTRAVENOUS | Status: AC
  Administered 2021-05-04: 130 mg via INTRAVENOUS
  Filled 2021-05-04: qty 65

## 2021-05-04 MED ORDER — ALTEPLASE 2 MG IJ SOLR
INTRAMUSCULAR | Status: AC
  Filled 2021-05-04: qty 2

## 2021-05-04 MED ORDER — SODIUM CHLORIDE 0.9% FLUSH
10.0000 mL | INTRAVENOUS | Status: DC | PRN
  Administered 2021-05-04: 10 mL
  Filled 2021-05-04: qty 10

## 2021-05-04 MED ORDER — PALONOSETRON HCL INJECTION 0.25 MG/5ML
INTRAVENOUS | Status: AC
  Filled 2021-05-04: qty 5

## 2021-05-04 MED ORDER — SODIUM CHLORIDE 0.9 % IV SOLN
200.0000 mg | Freq: Once | INTRAVENOUS | Status: AC
  Administered 2021-05-04: 200 mg via INTRAVENOUS
  Filled 2021-05-04: qty 8

## 2021-05-04 MED ORDER — SODIUM CHLORIDE 0.9 % IV SOLN
500.0000 mg/m2 | Freq: Once | INTRAVENOUS | Status: AC
  Administered 2021-05-04: 1300 mg via INTRAVENOUS
  Filled 2021-05-04: qty 65

## 2021-05-04 NOTE — Patient Instructions (Signed)
[facility]  Discharge Instructions: Thank you for choosing [facility] to provide your oncology and hematology care.   If you have a lab appointment with the [facility], please go directly to the [facility] and check in at the registration area.   Wear comfortable clothing and clothing appropriate for easy access to any Portacath or PICC line.   We strive to give you quality time with your provider. You may need to reschedule your appointment if you arrive late (15 or more minutes).  Arriving late affects you and other patients whose appointments are after yours.  Also, if you miss three or more appointments without notifying the office, you may be dismissed from the clinic at the provider's discretion.      For prescription refill requests, have your pharmacy contact our office and allow 72 hours for refills to be completed.    Today you received the following chemotherapy and/or immunotherapy agents : Keytruda, Adriamycin, Cytoxan   To help prevent nausea and vomiting after your treatment, we encourage you to take your nausea medication as directed.  BELOW ARE SYMPTOMS THAT SHOULD BE REPORTED IMMEDIATELY: *FEVER GREATER THAN 100.4 F (38 C) OR HIGHER *CHILLS OR SWEATING *NAUSEA AND VOMITING THAT IS NOT CONTROLLED WITH YOUR NAUSEA MEDICATION *UNUSUAL SHORTNESS OF BREATH *UNUSUAL BRUISING OR BLEEDING *URINARY PROBLEMS (pain or burning when urinating, or frequent urination) *BOWEL PROBLEMS (unusual diarrhea, constipation, pain near the anus) TENDERNESS IN MOUTH AND THROAT WITH OR WITHOUT PRESENCE OF ULCERS (sore throat, sores in mouth, or a toothache) UNUSUAL RASH, SWELLING OR PAIN  UNUSUAL VAGINAL DISCHARGE OR ITCHING   Items with * indicate a potential emergency and should be followed up as soon as possible or go to the Emergency Department if any problems should occur.  Please show the CHEMOTHERAPY ALERT CARD or IMMUNOTHERAPY ALERT  CARD at check-in to the Emergency Department and triage nurse.  Should you have questions after your visit or need to cancel or reschedule your appointment, please contact [facility]  Dept: [phone]  and follow the prompts.  Office hours are 8:00 a.m. to 4:30 p.m. Monday - Friday. Please note that voicemails left after 4:00 p.m. may not be returned until the following business day.  We are closed weekends and major holidays. You have access to a nurse at all times for urgent questions. Please call the main number to the clinic Dept: [phone] and follow the prompts.   For any non-urgent questions, you may also contact your provider using MyChart. We now offer e-Visits for anyone 56 and older to request care online for non-urgent symptoms. For details visit [email or web address].   Also download the MyChart app! Go to the app store, search "MyChart", open the app, select [facility], and log in with your MyChart username and password.  Due to Covid, a mask is required upon entering the hospital/clinic. If you do not have a mask, one will be given to you upon arrival. For doctor visits, patients may have 1 support person aged 56 or older with them. For treatment visits, patients cannot have anyone with them due to current Covid guidelines and our immunocompromised population.

## 2021-05-05 ENCOUNTER — Other Ambulatory Visit: Payer: Self-pay | Admitting: Oncology

## 2021-05-05 ENCOUNTER — Other Ambulatory Visit (HOSPITAL_COMMUNITY): Payer: Self-pay

## 2021-05-05 DIAGNOSIS — Z171 Estrogen receptor negative status [ER-]: Secondary | ICD-10-CM

## 2021-05-05 DIAGNOSIS — C50511 Malignant neoplasm of lower-outer quadrant of right female breast: Secondary | ICD-10-CM

## 2021-05-05 DIAGNOSIS — Z170 Estrogen receptor positive status [ER+]: Secondary | ICD-10-CM

## 2021-05-05 DIAGNOSIS — C50812 Malignant neoplasm of overlapping sites of left female breast: Secondary | ICD-10-CM

## 2021-05-05 MED ORDER — PROCHLORPERAZINE MALEATE 10 MG PO TABS
10.0000 mg | ORAL_TABLET | Freq: Four times a day (QID) | ORAL | 1 refills | Status: DC | PRN
  Filled 2021-05-05: qty 30, 8d supply, fill #0

## 2021-05-06 ENCOUNTER — Other Ambulatory Visit: Payer: Self-pay

## 2021-05-06 ENCOUNTER — Inpatient Hospital Stay: Payer: 59

## 2021-05-06 VITALS — BP 115/51 | HR 92 | Temp 98.1°F | Resp 18

## 2021-05-06 DIAGNOSIS — Z171 Estrogen receptor negative status [ER-]: Secondary | ICD-10-CM

## 2021-05-06 DIAGNOSIS — Z5111 Encounter for antineoplastic chemotherapy: Secondary | ICD-10-CM | POA: Diagnosis not present

## 2021-05-06 DIAGNOSIS — Z79899 Other long term (current) drug therapy: Secondary | ICD-10-CM | POA: Diagnosis not present

## 2021-05-06 DIAGNOSIS — C50812 Malignant neoplasm of overlapping sites of left female breast: Secondary | ICD-10-CM

## 2021-05-06 DIAGNOSIS — C50511 Malignant neoplasm of lower-outer quadrant of right female breast: Secondary | ICD-10-CM

## 2021-05-06 DIAGNOSIS — C50811 Malignant neoplasm of overlapping sites of right female breast: Secondary | ICD-10-CM | POA: Diagnosis not present

## 2021-05-06 DIAGNOSIS — Z170 Estrogen receptor positive status [ER+]: Secondary | ICD-10-CM | POA: Diagnosis not present

## 2021-05-06 DIAGNOSIS — Z5189 Encounter for other specified aftercare: Secondary | ICD-10-CM | POA: Diagnosis not present

## 2021-05-06 DIAGNOSIS — Z5112 Encounter for antineoplastic immunotherapy: Secondary | ICD-10-CM | POA: Diagnosis not present

## 2021-05-06 MED ORDER — PEGFILGRASTIM-BMEZ 6 MG/0.6ML ~~LOC~~ SOSY
6.0000 mg | PREFILLED_SYRINGE | Freq: Once | SUBCUTANEOUS | Status: AC
  Administered 2021-05-06: 6 mg via SUBCUTANEOUS

## 2021-05-06 MED ORDER — PEGFILGRASTIM-BMEZ 6 MG/0.6ML ~~LOC~~ SOSY
PREFILLED_SYRINGE | SUBCUTANEOUS | Status: AC
  Filled 2021-05-06: qty 0.6

## 2021-05-10 ENCOUNTER — Other Ambulatory Visit (HOSPITAL_COMMUNITY): Payer: Self-pay

## 2021-05-10 ENCOUNTER — Inpatient Hospital Stay (HOSPITAL_BASED_OUTPATIENT_CLINIC_OR_DEPARTMENT_OTHER): Payer: 59 | Admitting: Oncology

## 2021-05-10 ENCOUNTER — Inpatient Hospital Stay: Payer: 59

## 2021-05-10 ENCOUNTER — Other Ambulatory Visit: Payer: Self-pay

## 2021-05-10 ENCOUNTER — Other Ambulatory Visit: Payer: 59

## 2021-05-10 VITALS — BP 116/70 | HR 88 | Temp 97.7°F | Resp 19 | Ht 67.0 in | Wt 281.1 lb

## 2021-05-10 DIAGNOSIS — Z170 Estrogen receptor positive status [ER+]: Secondary | ICD-10-CM

## 2021-05-10 DIAGNOSIS — C50511 Malignant neoplasm of lower-outer quadrant of right female breast: Secondary | ICD-10-CM

## 2021-05-10 DIAGNOSIS — Z95828 Presence of other vascular implants and grafts: Secondary | ICD-10-CM

## 2021-05-10 DIAGNOSIS — Z171 Estrogen receptor negative status [ER-]: Secondary | ICD-10-CM

## 2021-05-10 DIAGNOSIS — Z5189 Encounter for other specified aftercare: Secondary | ICD-10-CM | POA: Diagnosis not present

## 2021-05-10 DIAGNOSIS — Z79899 Other long term (current) drug therapy: Secondary | ICD-10-CM | POA: Diagnosis not present

## 2021-05-10 DIAGNOSIS — C50812 Malignant neoplasm of overlapping sites of left female breast: Secondary | ICD-10-CM | POA: Diagnosis not present

## 2021-05-10 DIAGNOSIS — C50811 Malignant neoplasm of overlapping sites of right female breast: Secondary | ICD-10-CM | POA: Diagnosis not present

## 2021-05-10 DIAGNOSIS — Z5112 Encounter for antineoplastic immunotherapy: Secondary | ICD-10-CM | POA: Diagnosis not present

## 2021-05-10 DIAGNOSIS — Z5111 Encounter for antineoplastic chemotherapy: Secondary | ICD-10-CM | POA: Diagnosis not present

## 2021-05-10 LAB — CBC WITH DIFFERENTIAL/PLATELET
Abs Immature Granulocytes: 0 10*3/uL (ref 0.00–0.07)
Band Neutrophils: 2 %
Basophils Absolute: 0 10*3/uL (ref 0.0–0.1)
Basophils Relative: 0 %
Eosinophils Absolute: 0 10*3/uL (ref 0.0–0.5)
Eosinophils Relative: 0 %
HCT: 31.5 % — ABNORMAL LOW (ref 36.0–46.0)
Hemoglobin: 10.5 g/dL — ABNORMAL LOW (ref 12.0–15.0)
Lymphocytes Relative: 6 %
Lymphs Abs: 0.3 10*3/uL — ABNORMAL LOW (ref 0.7–4.0)
MCH: 31.4 pg (ref 26.0–34.0)
MCHC: 33.3 g/dL (ref 30.0–36.0)
MCV: 94.3 fL (ref 80.0–100.0)
Monocytes Absolute: 0.1 10*3/uL (ref 0.1–1.0)
Monocytes Relative: 1 %
Neutro Abs: 4.8 10*3/uL (ref 1.7–7.7)
Neutrophils Relative %: 91 %
Platelets: 243 10*3/uL (ref 150–400)
RBC: 3.34 MIL/uL — ABNORMAL LOW (ref 3.87–5.11)
RDW: 16 % — ABNORMAL HIGH (ref 11.5–15.5)
WBC: 5.2 10*3/uL (ref 4.0–10.5)
nRBC: 0 % (ref 0.0–0.2)

## 2021-05-10 LAB — COMPREHENSIVE METABOLIC PANEL
ALT: 20 U/L (ref 0–44)
AST: 12 U/L — ABNORMAL LOW (ref 15–41)
Albumin: 3.4 g/dL — ABNORMAL LOW (ref 3.5–5.0)
Alkaline Phosphatase: 98 U/L (ref 38–126)
Anion gap: 12 (ref 5–15)
BUN: 25 mg/dL — ABNORMAL HIGH (ref 6–20)
CO2: 24 mmol/L (ref 22–32)
Calcium: 10.3 mg/dL (ref 8.9–10.3)
Chloride: 102 mmol/L (ref 98–111)
Creatinine, Ser: 1.12 mg/dL — ABNORMAL HIGH (ref 0.44–1.00)
GFR, Estimated: 58 mL/min — ABNORMAL LOW (ref >60)
Glucose, Bld: 149 mg/dL — ABNORMAL HIGH (ref 70–99)
Potassium: 4.4 mmol/L (ref 3.5–5.1)
Sodium: 138 mmol/L (ref 135–145)
Total Bilirubin: 0.4 mg/dL (ref 0.3–1.2)
Total Protein: 7.3 g/dL (ref 6.5–8.1)

## 2021-05-10 MED ORDER — OMEPRAZOLE 40 MG PO CPDR
40.0000 mg | DELAYED_RELEASE_CAPSULE | Freq: Every evening | ORAL | 0 refills | Status: DC
Start: 2021-05-10 — End: 2022-01-01
  Filled 2021-05-10: qty 60, 60d supply, fill #0

## 2021-05-10 MED ORDER — FLUCONAZOLE 100 MG PO TABS
100.0000 mg | ORAL_TABLET | Freq: Every day | ORAL | 0 refills | Status: DC
  Filled 2021-05-10: qty 10, 10d supply, fill #0

## 2021-05-10 MED ORDER — ONDANSETRON HCL 8 MG PO TABS
8.0000 mg | ORAL_TABLET | Freq: Three times a day (TID) | ORAL | 0 refills | Status: DC | PRN
  Filled 2021-05-10: qty 20, 7d supply, fill #0

## 2021-05-10 MED ORDER — SODIUM CHLORIDE 0.9% FLUSH
10.0000 mL | Freq: Once | INTRAVENOUS | Status: AC
  Administered 2021-05-10: 10 mL
  Filled 2021-05-10: qty 10

## 2021-05-10 MED ORDER — HEPARIN SOD (PORK) LOCK FLUSH 100 UNIT/ML IV SOLN
500.0000 [IU] | Freq: Once | INTRAVENOUS | Status: AC
Start: 2021-05-10 — End: 2021-05-10
  Administered 2021-05-10: 500 [IU]
  Filled 2021-05-10: qty 5

## 2021-05-10 NOTE — Progress Notes (Signed)
Leslie Wright  Telephone:([phone]) [phone] [phone]) [phone]    ID: Leslie Wright DOB: October 14, 1965  MR#: [id number]  YKZ#:[id number]  Patient Care Team: Leslie Bien, MD as PCP - General (Family Medicine) Leslie Commons, MD (Inactive) as PCP - Cardiology (Cardiology) Leslie Kaufmann, RN as Oncology Nurse Navigator Leslie Germany, RN as Oncology Nurse Navigator Leslie Klein, MD as Consulting Physician (General Surgery) Leslie Rudd, MD as Consulting Physician (Radiation Oncology) Leslie Cruel, MD OTHER MD:  CHIEF COMPLAINT: Bilateral breast cancers  CURRENT TREATMENT: Neoadjuvant chemotherapy   INTERVAL HISTORY: Leslie Wright returns today for follow up and treatment of her bilateral breast cancers.   She began neoadjuvant chemotherapy, consisting of pembrolizumab, carboplatin, and paclitaxel, on 02/10/2021.  She developed peripheral neuropathy and stopped the Paclitaxel/Carboplatin early.    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 On her cycle 1 day 8 visit she developed febrile neutropenia and was hospitalized from 04/20/2021 through 04/25/2021.    She received cycle 2 at decreased doses on 05/04/2021.  She is here today to check nadir counts and overall tolerance  REVIEW OF SYSTEMS: Leslie Wright still feels tired but is "picking up a little energy".  She felt nauseated on day 4.  She vomited 1 time.  She might benefit from ondansetron.  She has had some indigestion.  She is taking low-dose Prilosec which has helped a little bit.  She has not had intercurrent fevers shaking chills or rash.  She has been mildly constipated.  Aside from these issues a detailed review of systems today was stable   COVID 19 VACCINATION STATUS: Status post [place] x2, no booster as of 01/24/2021   HISTORY OF CURRENT ILLNESS: From the original intake note:  Leslie Wright" presented with a palpable left breast lump. She underwent bilateral diagnostic mammography with tomography and bilateral breast ultrasonography at [facility] on 01/12/2021 showing: breast density category C; 5.8 cm mass in the left breast at 12:30 with associated calcifications; additional 1.2 cm group of calcifications in the outer left breast;   On the right: Multiple small masses and irregular ducts in the retroareolar right breast, including a 0.8 cm representative mass at 6 o'clock; indeterminate 0.4 cm mass in upper-inner right breast; normal lymph nodes bilaterally.  Accordingly on 01/16/2021 she proceeded to biopsy of the right breast areas in question. The pathology from this procedure ([id number]) showed:  1. Right Breast, 6 o'clock  - invasive ductal carcinoma, grade 2. Prognostic indicators significant for: estrogen receptor, 90% positive with strong staining intensity and progesterone receptor, 0% negative. Proliferation marker Ki67 at 5%. HER2 equivocal by immunohistochemistry (2+), but negative by fluorescent in situ hybridization with a signals ratio 1.61 and number per cell 2.5. 2. Right Breast, 9 o'clock  - fibrocystic change with adenosis, calcifications, and a small intraductal papilloma.  She underwent biopsy of the left breast areas on 01/20/2021. Pathology [id number]) revealed: 1. Left Breast, upper-outer quadrant  - invasive ductal carcinoma, grade 3 2. Left Breast, central, slightly lateral to midline  - ductal carcinoma in situ with necrosis and calcifications, intermediate grade Prognostic panels are pending on both samples.  Cancer Staging Malignant neoplasm of lower-outer quadrant of right breast of female, estrogen receptor positive ([facility]) Staging form: Breast, AJCC 8th Edition - Clinical stage from 01/20/2021: Stage IA (cT1c, cN0, cM0, G2, ER+, PR-, HER2-) - Signed by Leslie Cruel, MD on 01/25/2021 Stage prefix:  Initial diagnosis Histologic grading  system: 3 grade system  Malignant neoplasm of overlapping sites of left breast in female, estrogen receptor negative ([facility] [facility]) Staging form: Breast, AJCC 8th Edition - Clinical stage from 01/20/2021: Stage IIIB (cT3, cN0, cM0, G3, ER-, PR-, HER2-) - Signed by Leslie Cruel, MD on 01/25/2021 Histologic grading system: 3 grade system  The patient's subsequent history is as detailed below.   PAST MEDICAL HISTORY: Past Medical History:  Diagnosis Date   Acid reflux    Anemia    Arthritis    "maybe in my fingers" (03/26/2018)   Breast cancer ([facility]) 2021/01/24   both sides   Concussion 2001   no deficit had tia right after no problems since   Family history of adverse reaction to anesthesia    "brother, Timmy, and my mom always get sick" (03/26/2018)   Family history of brain cancer    Family history of breast cancer    Family history of colon cancer    Family history of lung cancer    Family history of pancreatic cancer    Family history of prostate cancer    Headache    High blood pressure    High cholesterol    Hip pain    "left; before OR" (03/26/2018)   History of chemotherapy last done 02-17-2021   History of hiatal hernia    History of kidney stones    OSA (obstructive sleep apnea)    "can't tolerate the mask" (03/26/2018)    PONV (postoperative nausea and vomiting)    does not need much anesthesia, slow to awlaekn in past   Pre-diabetes    Shortness of breath    with exertion   Wears partial dentures upper    PAST SURGICAL HISTORY: Past Surgical History:  Procedure Laterality Date   CARPAL TUNNEL RELEASE Right ~ 2006   JOINT REPLACEMENT     LAPAROSCOPIC CHOLECYSTECTOMY  01-25-11   LASIK Bilateral 2000   PORT A CATH REVISION Left 02/21/2021   Procedure: PORT A CATH REVISION;  Surgeon: Leslie Klein, MD;  Location: [facility];  Service: General;  Laterality: Left;  60 MINUTES ROOM 5   PORTACATH PLACEMENT N/A 02/08/2021   Procedure: INSERTION PORT-A-CATH;   Surgeon: Leslie Klein, MD;  Location: [facility];  Service: General;  Laterality: N/A;   TOTAL HIP ARTHROPLASTY Left 03/25/2018   Procedure: LEFT TOTAL HIP ARTHROPLASTY ANTERIOR APPROACH;  Surgeon: Mcarthur Rossetti, MD;  Location: [facility];  Service: Orthopedics;  Laterality: Left;    FAMILY HISTORY: Family History  Problem Relation Age of Onset   Diabetes Mother    Thyroid disease Mother    Liver disease Mother    Obesity Mother    Hypertension Father    Hyperlipidemia Father    Heart disease Father    Stroke Father    Obesity Father    Colon polyps Father    Prostate cancer Brother    Brain cancer Maternal Grandfather    Pancreatic cancer Paternal Grandmother    Colon cancer Neg Hx        No one in family with colon cancer.  The patient's father is 64 years old as of 01/24/21.  The patient's mother died from cirrhosis associated with hepatitis C (from a transfusion) age 56.  The patient has 3 brothers, no sisters.  1 brother has a history of prostate cancer at age 39.  A maternal grandfather had a history of central nervous system carcinoma.  A paternal  grandmother had a history of pancreatic cancer and a maternal aunt had a history of breast cancer in her late 31s.   GYNECOLOGIC HISTORY:  No LMP recorded (lmp unknown). Patient is postmenopausal. Menarche: 55 years old [place] P 0 LMP 53 HRT no  Hysterectomy? no BSO? no   SOCIAL HISTORY: (updated 12/2020)  Rosaria Ferries "Leslie Wright" works for [employer] in [employer].  She works from home.  She is a Engineer, mining.  She describes herself as single, lives by herself, with a toy poodle and a [place] as well as 2 cats.   ADVANCED DIRECTIVES: The patient's brother Afsa Meany is her healthcare power of attorney.  He can be reached at [phone]   HEALTH MAINTENANCE: Social History   Tobacco Use   Smoking status: Former    Types: Cigarettes   Smokeless tobacco: Never   Tobacco comments:    In teens social  Vaping Use    Vaping Use: Never used  Substance Use Topics   Alcohol use: Yes    Comment: Occasional   Drug use: Never     Colonoscopy:   PAP: 03/2011  Bone density: scheduled for 07/2021   No Known Allergies  Current Outpatient Medications  Medication Sig Dispense Refill   fluconazole (DIFLUCAN) 100 MG tablet Take 1 tablet (100 mg total) by mouth daily. Take for 4 days, then as needed 10 tablet 0   omeprazole (PRILOSEC) 40 MG capsule Take 1 capsule (40 mg total) by mouth at bedtime. 60 capsule 0   ondansetron (ZOFRAN) 8 MG tablet Take 1 tablet (8 mg total) by mouth every 8 (eight) hours as needed for nausea or vomiting. Do not take days 1 and 2 after chemotherapy. 20 tablet 0   acetaminophen (TYLENOL) 500 MG tablet Take 1,000 mg by mouth every 6 (six) hours as needed for moderate pain.     aspirin EC 81 MG tablet Take 81 mg by mouth daily.     azelastine (ASTELIN) 0.1 % nasal spray Use 1 - 2 sprays each nostril 1 - 2 times a day for nasal congestion 30 mL 11   benzonatate (TESSALON) 200 MG capsule Take 1 capsule by mouth 3 times a day when needed for cough 60 capsule 0   betamethasone valerate ointment (VALISONE) 0.1 % Apply 1 application topically 2 (two) times daily. (Patient taking differently: Apply 1 application topically 2 (two) times daily as needed (rash).) 30 g 0   Blood Glucose Monitoring Suppl (FREESTYLE LITE) w/Device KIT CHECK GLUCOSE 2-3 TIMES DAILY 1 kit 0   Cholecalciferol (VITAMIN D) 50 MCG (2000 UT) CAPS Take 2,000 Units by mouth daily.     dexamethasone (DECADRON) 4 MG tablet Take 2 tablets once a day for 3 days after carboplatin and AC chemotherapy. Take with food. (Patient taking differently: Take 4 mg by mouth See admin instructions. Take 2 tablets once a day for 3 days after carboplatin and AC chemotherapy. Take with food.) 30 tablet 1   folic acid (FOLVITE) 1 MG tablet Take 1 mg by mouth daily.     glucose blood test strip USE AS DIRECTED TO CHECK BLOOD SUGAR 1 TO 3 TIMES A DAY  (Patient taking differently: USE AS DIRECTED TO CHECK BLOOD SUGAR 1 TO 3 TIMES A DAY) 100 strip 3   ketoconazole (NIZORAL) 2 % cream Apply 1 application topically daily. (Patient taking differently: Apply 1 application topically daily as needed for irritation.) 15 g 0   Lancets (FREESTYLE) lancets USE AS DIRECTED TO CHECK  BLOOD SUGAR 2 TO 3 TIMES A DAY (Patient taking differently: USE AS DIRECTED TO CHECK BLOOD SUGAR 2 TO 3 TIMES A DAY) 100 each 3   lidocaine-prilocaine (EMLA) cream Apply to affected area once (Patient taking differently: Apply 1 application topically daily as needed (port access).) 30 g 3   losartan (COZAAR) 100 MG tablet Take 1 tablet by mouth daily (Patient taking differently: Take 50 mg by mouth daily.) 90 tablet 3   Multiple Vitamin (MULTIVITAMIN WITH MINERALS) TABS tablet Take 1 tablet by mouth daily.     omeprazole (PRILOSEC) 20 MG capsule Take 20 mg by mouth daily.     ondansetron (ZOFRAN) 8 MG tablet Take 1 tablet (8 mg total) by mouth 2 (two) times daily as needed. Start on the third day after carboplatin and AC chemotherapy. (Patient taking differently: Take 8 mg by mouth 2 (two) times daily as needed for nausea or vomiting. Start on the third day after carboplatin and AC chemotherapy.) 30 tablet 1   prochlorperazine (COMPAZINE) 10 MG tablet Take 1 tablet (10 mg total) by mouth every 6 (six) hours as needed (Nausea or vomiting). 30 tablet 1   rosuvastatin (CRESTOR) 10 MG tablet TAKE 1 TABLET BY MOUTH ONCE A DAY AT BEDTIME FOR CHOLESTEROL (Patient taking differently: Take 10 mg by mouth at bedtime.) 90 tablet 1   No current facility-administered medications for this visit.    OBJECTIVE: White woman who appears stated age  55:   05/10/21 1352  BP: 116/70  Pulse: 88  Resp: 19  Temp: 97.7 F (36.5 C)  SpO2: 96%       Body mass index is 44.03 kg/m.   Wt Readings from Last 3 Encounters:  05/10/21 281 lb 1.9 oz (127.5 kg)  05/04/21 287 lb 6.4 oz (130.4 kg)   04/20/21 298 lb (135.2 kg)   Sclerae unicteric, EOMs intact Wearing a mask No cervical or supraclavicular adenopathy Lungs no rales or rhonchi Heart regular rate and rhythm Abd soft, nontender, positive bowel sounds MSK no focal spinal tenderness, no upper extremity lymphedema Neuro: nonfocal, well oriented, appropriate affect Breasts: Deferred   LAB RESULTS:  CMP     Component Value Date/Time   NA 144 05/04/2021 1042   NA 143 12/02/2018 1138   K 3.9 05/04/2021 1042   CL 106 05/04/2021 1042   CO2 26 05/04/2021 1042   GLUCOSE 115 (H) 05/04/2021 1042   BUN 12 05/04/2021 1042   BUN 17 12/02/2018 1138   CREATININE 0.92 05/04/2021 1042   CREATININE 1.09 (H) 02/18/2019 1524   CALCIUM 9.8 05/04/2021 1042   PROT 7.1 05/04/2021 1042   PROT 6.9 12/02/2018 1138   ALBUMIN 2.8 (L) 05/04/2021 1042   ALBUMIN 4.0 12/02/2018 1138   AST 17 05/04/2021 1042   ALT 23 05/04/2021 1042   ALKPHOS 85 05/04/2021 1042   BILITOT 0.4 05/04/2021 1042   GFRNONAA >60 05/04/2021 1042   GFRAA 70 12/02/2018 1138    No results found for: TOTALPROTELP, ALBUMINELP, A1GS, A2GS, BETS, BETA2SER, GAMS, MSPIKE, SPEI  Lab Results  Component Value Date   WBC 5.2 05/10/2021   NEUTROABS PENDING 05/10/2021   HGB 10.5 (L) 05/10/2021   HCT 31.5 (L) 05/10/2021   MCV 94.3 05/10/2021   PLT 243 05/10/2021    No results found for: LABCA2  No components found for: ENIDPO242  No results for input(s): INR in the last 168 hours.  No results found for: LABCA2  No results found for: PNT614  No results  found for: ONG295  No results found for: MWU132  No results found for: CA2729  No components found for: HGQUANT  No results found for: CEA1 / No results found for: CEA1   No results found for: AFPTUMOR  No results found for: CHROMOGRNA  No results found for: KPAFRELGTCHN, LAMBDASER, KAPLAMBRATIO (kappa/lambda light chains)  No results found for: HGBA, HGBA2QUANT, HGBFQUANT,  HGBSQUAN (Hemoglobinopathy evaluation)   Lab Results  Component Value Date   LDH 283 (H) 04/23/2021    No results found for: IRON, TIBC, IRONPCTSAT (Iron and TIBC)  No results found for: FERRITIN  Urinalysis    Component Value Date/Time   COLORURINE YELLOW 04/20/2021 1505   APPEARANCEUR HAZY (A) 04/20/2021 1505   LABSPEC 1.015 04/20/2021 1505   PHURINE 5.0 04/20/2021 1505   GLUCOSEU NEGATIVE 04/20/2021 1505   HGBUR MODERATE (A) 04/20/2021 1505   BILIRUBINUR NEGATIVE 04/20/2021 Calcium 04/20/2021 1505   PROTEINUR NEGATIVE 04/20/2021 1505   UROBILINOGEN 0.2 10/23/2010 2031   NITRITE NEGATIVE 04/20/2021 1505   LEUKOCYTESUR NEGATIVE 04/20/2021 1505    STUDIES: DG Chest 2 View  Result Date: 04/20/2021 CLINICAL DATA:  CHILLS - work up [employer] for possible infection due to chemotherapy EXAM: CHEST - 2 VIEW COMPARISON:  Radiograph 02/21/2021, chest CT 02/15/2021 FINDINGS: Chest port catheter tip overlies the distal superior vena cava. Unchanged cardiomediastinal silhouette. There is no new focal airspace disease. No pleural effusion or visible pneumothorax. No acute osseous abnormality. IMPRESSION: No evidence of acute cardiopulmonary disease. Electronically Signed   By: Maurine Simmering   On: 04/20/2021 15:07   DG CHEST PORT 1 VIEW  Result Date: 04/24/2021 CLINICAL DATA:  Shortness of breath and dry cough, breast cancer on chemotherapy, neutropenic fever EXAM: PORTABLE CHEST 1 VIEW COMPARISON:  Radiograph 04/20/2021 FINDINGS: Markedly low lung volumes with some atelectatic changes. Diffuse hazy interstitial opacities are present throughout both lungs with more coalescent opacity seen in the right upper lung. Increasing prominence of the cardiac silhouette may reflect some diminished volumes and portable technique. Pulmonary vascularity is indistinct. Tunneled left IJ approach Port-A-Cath tip terminates in the mid SVC, similar to prior. Port-A-Cath is currently accessed at  this time. No discernible acute osseous or soft tissue abnormalities of chest wall. Degenerative changes are present in the imaged spine and shoulders. IMPRESSION: Diminishing lung volumes with interval development of diffuse hazy interstitial opacities bilaterally and more coalescent density in the right upper lobe. Findings could reflect pulmonary edema, acute infectious/inflammatory airspace disease, and/or ARDS. Prominence of the cardiac silhouette may be related to low volumes and portable technique. Electronically Signed   By: Lovena Le M.D.   On: 04/24/2021 04:01      ELIGIBLE FOR AVAILABLE RESEARCH PROTOCOL:   ASSESSMENT: 55 y.o. 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  (a) secondary biopsy shows ductal carcinoma in situ associated with extensive patchy enhancement  (3) staging studies:  (a) chest CT scan  02/06/2021 shows hepatic steatosis and degenerative disc disease, no evidence of metastatic disease  (b) bone scan 02/06/2021 shows no evidence of metastatic disease  (4) neoadjuvant chemotherapy will start 02/09/2021 consisting of pembrolizumab/ carboplatin/ paclitaxel stopped after 3 cycles due to peripheral neuropathy followed by pembrolizumab/ doxorubicin/ cyclophosphamide x4 started 04/13/2021  (A) doxorubicin/cyclophosphamide dose reduced starting with cycle 2  (5) definitive  surgery to follow  (6) adjuvant radiation to bilateral sites  (7) Negative genetic testing on 02/13/2021. VUS in ATM called c.1132A>G identified.  Genes tested include: AIP, ALK, APC, ATM, AXIN2,BAP1,  BARD1, BLM, BMPR1A, BRCA1, BRCA2, BRIP1, CASR, CDC73, CDH1, CDK4, CDKN1B, CDKN1C, CDKN2A (p14ARF), CDKN2A (p16INK4a), CEBPA, CHEK2, CTNNA1, DICER1, DIS3L2, EGFR (c.2369C>T, p.Thr790Met variant only), EPCAM (Deletion/duplication testing only), FH, FLCN, GATA2, GPC3, GREM1 (Promoter region deletion/duplication testing only), HOXB13 (c.251G>A, p.Gly84Glu), HRAS, KIT, MAX, MEN1, MET, MITF (c.952G>A, p.Glu318Lys variant only), MLH1, MSH2, MSH3, MSH6, MUTYH, NBN, NF1, NF2, NTHL1, PALB2, PDGFRA, PHOX2B, PMS2, POLD1, POLE, POT1, PRKAR1A, PTCH1, PTEN, RAD50, RAD51C, RAD51D, RB1, RECQL4, RET, RUNX1, SDHAF2, SDHA (sequence changes only), SDHB, SDHC, SDHD, SMAD4, SMARCA4, SMARCB1, SMARCE1, STK11, SUFU, TERC, TERT, TMEM127, TP53, TSC1, TSC2, VHL, WRN and WT1.   PLAN: Leslie Wright tolerated the second cycle of the current chemo much better at reduced doses and I am making no further adjustments.  She will have cycle 3 on 05/25/2021.  I added ondansetron for her to take for any nausea after day 3 of her chemo.  I started her on omeprazole 40 mg at bedtime which I think will take care of the indigestion problem.  Finally I added Diflucan to take 1 tablet daily until the yeast problem has resolved.  At the next visit I will set her up for her MRI when she completes chemotherapy  Total encounter time 25 minutes.Sarajane Jews C. Lizandro Spellman, MD 05/10/21 2:06 PM Medical Oncology and Hematology [facility] [place], [place] [address] Tel. [phone]    Fax. [phone]   I, Wilburn Mylar, am acting as scribe for Dr. Virgie Dad. Omesha Bowerman.  I, Lurline Del MD, have reviewed the above documentation for accuracy and completeness, and I agree with the above.    *Total Encounter Time as defined by  the Centers for Medicare and Medicaid Services includes, in addition to the face-to-face time of a patient visit (documented in the note above) non-face-to-face time: obtaining and reviewing outside history, ordering and reviewing medications, tests or procedures, care coordination (communications with other health care professionals or caregivers) and documentation in the medical record.

## 2021-05-17 ENCOUNTER — Other Ambulatory Visit (HOSPITAL_COMMUNITY): Payer: Self-pay

## 2021-05-24 MED FILL — Fosaprepitant Dimeglumine For IV Infusion 150 MG (Base Eq): INTRAVENOUS | Qty: 5 | Status: AC

## 2021-05-24 MED FILL — Dexamethasone Sodium Phosphate Inj 100 MG/10ML: INTRAMUSCULAR | Qty: 1 | Status: AC

## 2021-05-25 ENCOUNTER — Inpatient Hospital Stay: Payer: 59

## 2021-05-25 ENCOUNTER — Other Ambulatory Visit: Payer: Self-pay

## 2021-05-25 ENCOUNTER — Inpatient Hospital Stay: Payer: 59 | Admitting: Oncology

## 2021-05-25 ENCOUNTER — Other Ambulatory Visit: Payer: Self-pay | Admitting: *Deleted

## 2021-05-25 ENCOUNTER — Encounter: Payer: Self-pay | Admitting: *Deleted

## 2021-05-25 VITALS — HR 95

## 2021-05-25 VITALS — BP 110/80 | HR 107 | Temp 97.5°F | Resp 18 | Ht 67.0 in | Wt 279.0 lb

## 2021-05-25 DIAGNOSIS — C50812 Malignant neoplasm of overlapping sites of left female breast: Secondary | ICD-10-CM | POA: Diagnosis not present

## 2021-05-25 DIAGNOSIS — Z5111 Encounter for antineoplastic chemotherapy: Secondary | ICD-10-CM | POA: Diagnosis not present

## 2021-05-25 DIAGNOSIS — C50811 Malignant neoplasm of overlapping sites of right female breast: Secondary | ICD-10-CM | POA: Diagnosis not present

## 2021-05-25 DIAGNOSIS — M539 Dorsopathy, unspecified: Secondary | ICD-10-CM

## 2021-05-25 DIAGNOSIS — Z171 Estrogen receptor negative status [ER-]: Secondary | ICD-10-CM

## 2021-05-25 DIAGNOSIS — Z170 Estrogen receptor positive status [ER+]: Secondary | ICD-10-CM | POA: Diagnosis not present

## 2021-05-25 DIAGNOSIS — C50511 Malignant neoplasm of lower-outer quadrant of right female breast: Secondary | ICD-10-CM

## 2021-05-25 DIAGNOSIS — Z95828 Presence of other vascular implants and grafts: Secondary | ICD-10-CM

## 2021-05-25 DIAGNOSIS — K760 Fatty (change of) liver, not elsewhere classified: Secondary | ICD-10-CM

## 2021-05-25 DIAGNOSIS — Z5112 Encounter for antineoplastic immunotherapy: Secondary | ICD-10-CM | POA: Diagnosis not present

## 2021-05-25 DIAGNOSIS — Z5189 Encounter for other specified aftercare: Secondary | ICD-10-CM | POA: Diagnosis not present

## 2021-05-25 DIAGNOSIS — Z79899 Other long term (current) drug therapy: Secondary | ICD-10-CM | POA: Diagnosis not present

## 2021-05-25 LAB — CBC WITH DIFFERENTIAL/PLATELET
Abs Immature Granulocytes: 0.05 10*3/uL (ref 0.00–0.07)
Basophils Absolute: 0.1 10*3/uL (ref 0.0–0.1)
Basophils Relative: 1 %
Eosinophils Absolute: 0.1 10*3/uL (ref 0.0–0.5)
Eosinophils Relative: 1 %
HCT: 30.2 % — ABNORMAL LOW (ref 36.0–46.0)
Hemoglobin: 10.3 g/dL — ABNORMAL LOW (ref 12.0–15.0)
Immature Granulocytes: 1 %
Lymphocytes Relative: 10 %
Lymphs Abs: 1 10*3/uL (ref 0.7–4.0)
MCH: 32 pg (ref 26.0–34.0)
MCHC: 34.1 g/dL (ref 30.0–36.0)
MCV: 93.8 fL (ref 80.0–100.0)
Monocytes Absolute: 1.1 10*3/uL — ABNORMAL HIGH (ref 0.1–1.0)
Monocytes Relative: 11 %
Neutro Abs: 7.7 10*3/uL (ref 1.7–7.7)
Neutrophils Relative %: 76 %
Platelets: 166 10*3/uL (ref 150–400)
RBC: 3.22 MIL/uL — ABNORMAL LOW (ref 3.87–5.11)
RDW: 18.1 % — ABNORMAL HIGH (ref 11.5–15.5)
WBC: 10 10*3/uL (ref 4.0–10.5)
nRBC: 0 % (ref 0.0–0.2)

## 2021-05-25 LAB — COMPREHENSIVE METABOLIC PANEL
ALT: 19 U/L (ref 0–44)
AST: 16 U/L (ref 15–41)
Albumin: 3.5 g/dL (ref 3.5–5.0)
Alkaline Phosphatase: 68 U/L (ref 38–126)
Anion gap: 11 (ref 5–15)
BUN: 12 mg/dL (ref 6–20)
CO2: 23 mmol/L (ref 22–32)
Calcium: 9.5 mg/dL (ref 8.9–10.3)
Chloride: 106 mmol/L (ref 98–111)
Creatinine, Ser: 1.35 mg/dL — ABNORMAL HIGH (ref 0.44–1.00)
GFR, Estimated: 46 mL/min — ABNORMAL LOW (ref >60)
Glucose, Bld: 122 mg/dL — ABNORMAL HIGH (ref 70–99)
Potassium: 3.9 mmol/L (ref 3.5–5.1)
Sodium: 140 mmol/L (ref 135–145)
Total Bilirubin: 0.4 mg/dL (ref 0.3–1.2)
Total Protein: 6.9 g/dL (ref 6.5–8.1)

## 2021-05-25 LAB — TSH: TSH: 1.202 u[IU]/mL (ref 0.308–3.960)

## 2021-05-25 MED ORDER — ALTEPLASE 2 MG IJ SOLR
2.0000 mg | Freq: Once | INTRAMUSCULAR | Status: AC
  Administered 2021-05-25: 2 mg

## 2021-05-25 MED ORDER — SODIUM CHLORIDE 0.9% FLUSH
10.0000 mL | Freq: Once | INTRAVENOUS | Status: AC
  Administered 2021-05-25: 10 mL

## 2021-05-25 MED ORDER — DOXORUBICIN HCL CHEMO IV INJECTION 2 MG/ML
50.0000 mg/m2 | Freq: Once | INTRAVENOUS | Status: AC
  Administered 2021-05-25: 130 mg via INTRAVENOUS
  Filled 2021-05-25: qty 65

## 2021-05-25 MED ORDER — SODIUM CHLORIDE 0.9 % IV SOLN
150.0000 mg | Freq: Once | INTRAVENOUS | Status: AC
  Administered 2021-05-25: 150 mg via INTRAVENOUS
  Filled 2021-05-25: qty 150

## 2021-05-25 MED ORDER — PALONOSETRON HCL INJECTION 0.25 MG/5ML
0.2500 mg | Freq: Once | INTRAVENOUS | Status: AC
  Administered 2021-05-25: 0.25 mg via INTRAVENOUS
  Filled 2021-05-25: qty 5

## 2021-05-25 MED ORDER — SODIUM CHLORIDE 0.9% FLUSH
10.0000 mL | INTRAVENOUS | Status: DC | PRN
  Administered 2021-05-25: 10 mL

## 2021-05-25 MED ORDER — ALTEPLASE 2 MG IJ SOLR
INTRAMUSCULAR | Status: AC
  Filled 2021-05-25: qty 2

## 2021-05-25 MED ORDER — SODIUM CHLORIDE 0.9 % IV SOLN
500.0000 mg/m2 | Freq: Once | INTRAVENOUS | Status: AC
  Administered 2021-05-25: 1300 mg via INTRAVENOUS
  Filled 2021-05-25: qty 65

## 2021-05-25 MED ORDER — SODIUM CHLORIDE 0.9 % IV SOLN
200.0000 mg | Freq: Once | INTRAVENOUS | Status: AC
  Administered 2021-05-25: 200 mg via INTRAVENOUS
  Filled 2021-05-25: qty 8

## 2021-05-25 MED ORDER — SODIUM CHLORIDE 0.9 % IV SOLN
10.0000 mg | Freq: Once | INTRAVENOUS | Status: AC
  Administered 2021-05-25: 10 mg via INTRAVENOUS
  Filled 2021-05-25: qty 10

## 2021-05-25 MED ORDER — HEPARIN SOD (PORK) LOCK FLUSH 100 UNIT/ML IV SOLN
500.0000 [IU] | Freq: Once | INTRAVENOUS | Status: AC | PRN
  Administered 2021-05-25: 500 [IU]

## 2021-05-25 MED ORDER — SODIUM CHLORIDE 0.9 % IV SOLN: Freq: Once | INTRAVENOUS | Status: AC

## 2021-05-25 NOTE — Progress Notes (Signed)
[facility]  [phone] [phone]) [phone]    ID: Leslie Wright DOB: 02/16/1966  MR#: [id number]  SFK#:[id number]  Patient Care Team: Fanny Bien, MD as PCP - General (Family Medicine) Herminio Commons, MD (Inactive) as PCP - Cardiology (Cardiology) Mauro Kaufmann, RN as Oncology Nurse Navigator Rockwell Germany, RN as Oncology Nurse Navigator Stark Klein, MD as Consulting Physician (General Surgery) Kyung Rudd, MD as Consulting Physician (Radiation Oncology) Chauncey Cruel, MD OTHER MD:  CHIEF COMPLAINT: Bilateral breast cancers  CURRENT TREATMENT: Neoadjuvant chemotherapy   INTERVAL HISTORY: Leslie Wright returns today for follow up and treatment of her bilateral breast cancers.   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 On her cycle 1 day 8 visit she developed febrile neutropenia and was hospitalized from 04/20/2021 through 04/25/2021.    She received cycle 2 at decreased doses on 05/04/2021.  She tolerated this much better, and she will proceed with cycle 3 today at the same dose.   REVIEW OF SYSTEMS: Leslie Wright tolerated cycle 2 much better and is now doing "very good".  She has less reflux.  She has a little bit of like toast intolerance and when she inadvertently drinks milk or eats cheese she gets a little bit of loose bowel movement.  She does not have the energy she would like.  Aside from these issues a detailed review of systems today was stable   COVID 19 VACCINATION STATUS: Status post [place] x2, no booster as of 01/24/2021   HISTORY OF CURRENT ILLNESS: From the original intake note:  Leslie Wright" presented with a palpable left breast lump. She underwent bilateral diagnostic mammography with tomography and bilateral breast ultrasonography at [facility] on 01/12/2021 showing: breast density category C; 5.8 cm  mass in the left breast at 12:30 with associated calcifications; additional 1.2 cm group of calcifications in the outer left breast;   On the right: Multiple small masses and irregular ducts in the retroareolar right breast, including a 0.8 cm representative mass at 6 o'clock; indeterminate 0.4 cm mass in upper-inner right breast; normal lymph nodes bilaterally.  Accordingly on 01/16/2021 she proceeded to biopsy of the right breast areas in question. The pathology from this procedure ([id number]) showed:  1. Right Breast, 6 o'clock  - invasive ductal carcinoma, grade 2. Prognostic indicators significant for: estrogen receptor, 90% positive with strong staining intensity and progesterone receptor, 0% negative. Proliferation marker Ki67 at 5%. HER2 equivocal by immunohistochemistry (2+), but negative by fluorescent in situ hybridization with a signals ratio 1.61 and number per cell 2.5. 2. Right Breast, 9 o'clock  - fibrocystic change with adenosis, calcifications, and a small intraductal papilloma.  She underwent biopsy of the left breast areas on 01/20/2021. Pathology [id number]) revealed: 1. Left Breast, upper-outer quadrant  - invasive ductal carcinoma, grade 3 2. Left Breast, central, slightly lateral to midline  - ductal carcinoma in situ with necrosis and calcifications, intermediate grade Prognostic panels are pending on both samples.  Cancer Staging Malignant neoplasm of lower-outer quadrant of right breast of female, estrogen receptor positive ([place]) Staging form: Breast, AJCC 8th Edition - Clinical stage from 01/20/2021: Stage IA (cT1c, cN0, cM0, G2, ER+, PR-, HER2-) - Signed by Chauncey Cruel, MD on 01/25/2021 Stage prefix: Initial diagnosis Histologic grading system: 3 grade system  Malignant neoplasm of overlapping sites of left breast in female, estrogen receptor negative ([place]) Staging form: Breast, AJCC  8th Edition - Clinical stage from 01/20/2021: Stage IIIB (cT3, cN0, cM0,  G3, ER-, PR-, HER2-) - Signed by Chauncey Cruel, MD on 01/25/2021 Histologic grading system: 3 grade system  The patient's subsequent history is as detailed below.   PAST MEDICAL HISTORY: Past Medical History:  Diagnosis Date   Acid reflux    Anemia    Arthritis    "maybe in my fingers" (03/26/2018)   Breast cancer ([place]) 01-29-21   both sides   Concussion 2001   no deficit had tia right after no problems since   Family history of adverse reaction to anesthesia    "brother, Timmy, and my mom always get sick" (03/26/2018)   Family history of brain cancer    Family history of breast cancer    Family history of colon cancer    Family history of lung cancer    Family history of pancreatic cancer    Family history of prostate cancer    Headache    High blood pressure    High cholesterol    Hip pain    "left; before OR" (03/26/2018)   History of chemotherapy last done 02-17-2021   History of hiatal hernia    History of kidney stones    OSA (obstructive sleep apnea)    "can't tolerate the mask" (03/26/2018)    PONV (postoperative nausea and vomiting)    does not need much anesthesia, slow to awlaekn in past   Pre-diabetes    Shortness of breath    with exertion   Wears partial dentures upper    PAST SURGICAL HISTORY: Past Surgical History:  Procedure Laterality Date   CARPAL TUNNEL RELEASE Right ~ 2006   JOINT REPLACEMENT     LAPAROSCOPIC CHOLECYSTECTOMY  01-30-11   LASIK Bilateral 2000   PORT A CATH REVISION Left 02/21/2021   Procedure: PORT A CATH REVISION;  Surgeon: Stark Klein, MD;  Location: [facility];  Service: General;  Laterality: Left;  60 MINUTES ROOM 5   PORTACATH PLACEMENT N/A 02/08/2021   Procedure: INSERTION PORT-A-CATH;  Surgeon: Stark Klein, MD;  Location: [facility];  Service: General;  Laterality: N/A;   TOTAL HIP ARTHROPLASTY Left 03/25/2018   Procedure: LEFT TOTAL HIP ARTHROPLASTY ANTERIOR APPROACH;  Surgeon: Mcarthur Rossetti, MD;   Location: [facility];  Service: Orthopedics;  Laterality: Left;    FAMILY HISTORY: Family History  Problem Relation Age of Onset   Diabetes Mother    Thyroid disease Mother    Liver disease Mother    Obesity Mother    Hypertension Father    Hyperlipidemia Father    Heart disease Father    Stroke Father    Obesity Father    Colon polyps Father    Prostate cancer Brother    Brain cancer Maternal Grandfather    Pancreatic cancer Paternal Grandmother    Colon cancer Neg Hx        No one in family with colon cancer.  The patient's father is 37 years old as of Jan 29, 2021.  The patient's mother died from cirrhosis associated with hepatitis C (from a transfusion) age 29.  The patient has 3 brothers, no sisters.  1 brother has a history of prostate cancer at age 68.  A maternal grandfather had a history of central nervous system carcinoma.  A paternal grandmother had a history of pancreatic cancer and a maternal aunt had a history of breast cancer in her late 22s.   GYNECOLOGIC HISTORY:  No  LMP recorded (lmp unknown). Patient is postmenopausal. Menarche: 55 years old [place] P 0 LMP 53 HRT no  Hysterectomy? no BSO? no   SOCIAL HISTORY: (updated 12/2020)  Leslie Wright "Leslie Wright" works for [employer] in [employer].  She works from home.  She is a Engineer, mining.  She describes herself as single, lives by herself, with a toy poodle and a [place] as well as 2 cats.   ADVANCED DIRECTIVES: The patient's brother Atlee Villers is her healthcare power of attorney.  He can be reached at [phone]   HEALTH MAINTENANCE: Social History   Tobacco Use   Smoking status: Former    Types: Cigarettes   Smokeless tobacco: Never   Tobacco comments:    In teens social  Vaping Use   Vaping Use: Never used  Substance Use Topics   Alcohol use: Yes    Comment: Occasional   Drug use: Never     Colonoscopy:   PAP: 03/2011  Bone density: scheduled for 07/2021   No Known Allergies  Current Outpatient  Medications  Medication Sig Dispense Refill   acetaminophen (TYLENOL) 500 MG tablet Take 1,000 mg by mouth every 6 (six) hours as needed for moderate pain.     aspirin EC 81 MG tablet Take 81 mg by mouth daily.     azelastine (ASTELIN) 0.1 % nasal spray Use 1 - 2 sprays each nostril 1 - 2 times a day for nasal congestion 30 mL 11   benzonatate (TESSALON) 200 MG capsule Take 1 capsule by mouth 3 times a day when needed for cough 60 capsule 0   betamethasone valerate ointment (VALISONE) 0.1 % Apply 1 application topically 2 (two) times daily. (Patient taking differently: Apply 1 application topically 2 (two) times daily as needed (rash).) 30 g 0   Blood Glucose Monitoring Suppl (FREESTYLE LITE) w/Device KIT CHECK GLUCOSE 2-3 TIMES DAILY 1 kit 0   Cholecalciferol (VITAMIN D) 50 MCG (2000 UT) CAPS Take 2,000 Units by mouth daily.     dexamethasone (DECADRON) 4 MG tablet Take 2 tablets once a day for 3 days after carboplatin and AC chemotherapy. Take with food. (Patient taking differently: Take 4 mg by mouth See admin instructions. Take 2 tablets once a day for 3 days after carboplatin and AC chemotherapy. Take with food.) 30 tablet 1   fluconazole (DIFLUCAN) 100 MG tablet Take 1 tablet (100 mg total) by mouth daily for 4 days, then as needed 10 tablet 0   folic acid (FOLVITE) 1 MG tablet Take 1 mg by mouth daily.     glucose blood test strip USE AS DIRECTED TO CHECK BLOOD SUGAR 1 TO 3 TIMES A DAY (Patient taking differently: USE AS DIRECTED TO CHECK BLOOD SUGAR 1 TO 3 TIMES A DAY) 100 strip 3   ketoconazole (NIZORAL) 2 % cream Apply 1 application topically daily. (Patient taking differently: Apply 1 application topically daily as needed for irritation.) 15 g 0   Lancets (FREESTYLE) lancets USE AS DIRECTED TO CHECK BLOOD SUGAR 2 TO 3 TIMES A DAY (Patient taking differently: USE AS DIRECTED TO CHECK BLOOD SUGAR 2 TO 3 TIMES A DAY) 100 each 3   lidocaine-prilocaine (EMLA) cream Apply to affected area once  (Patient taking differently: Apply 1 application topically daily as needed (port access).) 30 g 3   losartan (COZAAR) 100 MG tablet Take 1 tablet by mouth daily (Patient taking differently: Take 50 mg by mouth daily.) 90 tablet 3   Multiple Vitamin (MULTIVITAMIN WITH MINERALS) TABS  tablet Take 1 tablet by mouth daily.     omeprazole (PRILOSEC) 20 MG capsule Take 20 mg by mouth daily.     omeprazole (PRILOSEC) 40 MG capsule Take 1 capsule (40 mg total) by mouth at bedtime. 60 capsule 0   ondansetron (ZOFRAN) 8 MG tablet Take 1 tablet (8 mg total) by mouth 2 (two) times daily as needed. Start on the third day after carboplatin and AC chemotherapy. (Patient taking differently: Take 8 mg by mouth 2 (two) times daily as needed for nausea or vomiting. Start on the third day after carboplatin and AC chemotherapy.) 30 tablet 1   ondansetron (ZOFRAN) 8 MG tablet Take 1 tablet (8 mg total) by mouth every 8 (eight) hours as needed for nausea or vomiting. Do not take days 1 and 2 after chemotherapy (Start day 3 after Chemotherapy.) 20 tablet 0   prochlorperazine (COMPAZINE) 10 MG tablet Take 1 tablet (10 mg total) by mouth every 6 (six) hours as needed (Nausea or vomiting). 30 tablet 1   rosuvastatin (CRESTOR) 10 MG tablet TAKE 1 TABLET BY MOUTH ONCE A DAY AT BEDTIME FOR CHOLESTEROL (Patient taking differently: Take 10 mg by mouth at bedtime.) 90 tablet 1   No current facility-administered medications for this visit.   Facility-Administered Medications Ordered in Other Visits  Medication Dose Route Frequency Provider Last Rate Last Admin   sodium chloride flush (NS) 0.9 % injection 10 mL  10 mL Intracatheter PRN Zyair Russi, Virgie Dad, MD   10 mL at 05/25/21 1721    OBJECTIVE: White woman who appears stated age  55:   05/25/21 1430  BP: 110/80  Pulse: (!) 107  Resp: 18  Temp: (!) 97.5 F (36.4 C)  SpO2: 99%        Body mass index is 43.7 kg/m.   Wt Readings from Last 3 Encounters:  05/25/21 279  lb (126.6 kg)  05/10/21 281 lb 1.9 oz (127.5 kg)  05/04/21 287 lb 6.4 oz (130.4 kg)   Sclerae unicteric, EOMs intact Wearing a mask No cervical or supraclavicular adenopathy Lungs no rales or rhonchi Heart regular rate and rhythm Abd soft, nontender, positive bowel sounds MSK no focal spinal tenderness, no upper extremity lymphedema Neuro: nonfocal, well oriented, appropriate affect Breasts: Deferred   LAB RESULTS:  CMP     Component Value Date/Time   NA 140 05/25/2021 1404   NA 143 12/02/2018 1138   K 3.9 05/25/2021 1404   CL 106 05/25/2021 1404   CO2 23 05/25/2021 1404   GLUCOSE 122 (H) 05/25/2021 1404   BUN 12 05/25/2021 1404   BUN 17 12/02/2018 1138   CREATININE 1.35 (H) 05/25/2021 1404   CREATININE 0.92 05/04/2021 1042   CREATININE 1.09 (H) 02/18/2019 1524   CALCIUM 9.5 05/25/2021 1404   PROT 6.9 05/25/2021 1404   PROT 6.9 12/02/2018 1138   ALBUMIN 3.5 05/25/2021 1404   ALBUMIN 4.0 12/02/2018 1138   AST 16 05/25/2021 1404   AST 17 05/04/2021 1042   ALT 19 05/25/2021 1404   ALT 23 05/04/2021 1042   ALKPHOS 68 05/25/2021 1404   BILITOT 0.4 05/25/2021 1404   BILITOT 0.4 05/04/2021 1042   GFRNONAA 46 (L) 05/25/2021 1404   GFRNONAA >60 05/04/2021 1042   GFRAA 70 12/02/2018 1138    No results found for: TOTALPROTELP, ALBUMINELP, A1GS, A2GS, BETS, BETA2SER, GAMS, MSPIKE, SPEI  Lab Results  Component Value Date   WBC 10.0 05/25/2021   NEUTROABS 7.7 05/25/2021   HGB 10.3 (L) 05/25/2021  HCT 30.2 (L) 05/25/2021   MCV 93.8 05/25/2021   PLT 166 05/25/2021    No results found for: LABCA2  No components found for: QPYPPJ093  No results for input(s): INR in the last 168 hours.  No results found for: LABCA2  No results found for: OIZ124  No results found for: PYK998  No results found for: PJA250  No results found for: CA2729  No components found for: HGQUANT  No results found for: CEA1 / No results found for: CEA1   No results found for:  AFPTUMOR  No results found for: CHROMOGRNA  No results found for: KPAFRELGTCHN, LAMBDASER, KAPLAMBRATIO (kappa/lambda light chains)  No results found for: HGBA, HGBA2QUANT, HGBFQUANT, HGBSQUAN (Hemoglobinopathy evaluation)   Lab Results  Component Value Date   LDH 283 (H) 04/23/2021    No results found for: IRON, TIBC, IRONPCTSAT (Iron and TIBC)  No results found for: FERRITIN  Urinalysis    Component Value Date/Time   COLORURINE YELLOW 04/20/2021 1505   APPEARANCEUR HAZY (A) 04/20/2021 1505   LABSPEC 1.015 04/20/2021 1505   PHURINE 5.0 04/20/2021 1505   GLUCOSEU NEGATIVE 04/20/2021 1505   HGBUR MODERATE (A) 04/20/2021 1505   BILIRUBINUR NEGATIVE 04/20/2021 1505   KETONESUR NEGATIVE 04/20/2021 1505   PROTEINUR NEGATIVE 04/20/2021 1505   UROBILINOGEN 0.2 10/23/2010 2031   NITRITE NEGATIVE 04/20/2021 1505   LEUKOCYTESUR NEGATIVE 04/20/2021 1505    STUDIES: No results found.   ELIGIBLE FOR AVAILABLE RESEARCH PROTOCOL:   ASSESSMENT: 55 y.o. 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  (a) secondary biopsy shows ductal carcinoma in situ associated with extensive patchy enhancement  (3) staging studies:  (a) chest CT scan 02/06/2021 shows hepatic steatosis and degenerative disc disease, no evidence of metastatic disease  (b) bone scan 02/06/2021 shows no evidence of metastatic  disease  (4) neoadjuvant chemotherapy will start 02/09/2021 consisting of pembrolizumab/ carboplatin/ paclitaxel stopped after 3 cycles due to peripheral neuropathy followed by pembrolizumab/ doxorubicin/ cyclophosphamide x4 started 04/13/2021  (A) doxorubicin/cyclophosphamide dose reduced starting with cycle 2  (5) definitive surgery to follow  (6) adjuvant radiation to bilateral sites  (7) Negative genetic testing on 02/13/2021. VUS in ATM called c.1132A>G identified.  Genes tested include: AIP, ALK, APC, ATM, AXIN2,BAP1,  BARD1, BLM, BMPR1A, BRCA1, BRCA2, BRIP1, CASR, CDC73, CDH1, CDK4, CDKN1B, CDKN1C, CDKN2A (p14ARF), CDKN2A (p16INK4a), CEBPA, CHEK2, CTNNA1, DICER1, DIS3L2, EGFR (c.2369C>T, p.Thr790Met variant only), EPCAM (Deletion/duplication testing only), FH, FLCN, GATA2, GPC3, GREM1 (Promoter region deletion/duplication testing only), HOXB13 (c.251G>A, p.Gly84Glu), HRAS, KIT, MAX, MEN1, MET, MITF (c.952G>A, p.Glu318Lys variant only), MLH1, MSH2, MSH3, MSH6, MUTYH, NBN, NF1, NF2, NTHL1, PALB2, PDGFRA, PHOX2B, PMS2, POLD1, POLE, POT1, PRKAR1A, PTCH1, PTEN, RAD50, RAD51C, RAD51D, RB1, RECQL4, RET, RUNX1, SDHAF2, SDHA (sequence changes only), SDHB, SDHC, SDHD, SMAD4, SMARCA4, SMARCB1, SMARCE1, STK11, SUFU, TERC, TERT, TMEM127, TP53, TSC1, TSC2, VHL, WRN and WT1.   PLAN: Leslie Wright will proceed to her third cycle of doxorubicin/cyclophosphamide today.  She  will receive her growth factor support in 2 days.  Anticipate she will do well with this as she did well last time and then she will have her last chemotherapy cycle 3 weeks from now.  She will then be ready for breast MRI and to meet again with Dr.Byerly to discuss definitive surgical plans.  We will make sure all that is in place when she returns to see [place] 3 weeks from now.  She understands that we will be continuing the [facility] after she completes her chemotherapy so she will keep her port after her surgery.  Total encounter time 25  minutes.Sarajane Jews C. Lattie Cervi, MD 05/25/21 6:54 PM Medical Oncology and Hematology [facility] [place], [place] [address] Tel. [phone]    Fax. [phone]   I, Wilburn Mylar, am acting as scribe for Dr. Virgie Dad. Jabes Primo.  I, Lurline Del MD, have reviewed the above documentation for accuracy and completeness, and I agree with the above.   *Total Encounter Time as defined by the Centers for Medicare and Medicaid Services includes, in addition to the face-to-face time of a patient visit (documented in the note above) non-face-to-face time: obtaining and reviewing outside history, ordering and reviewing medications, tests or procedures, care coordination (communications with other health care professionals or caregivers) and documentation in the medical record.

## 2021-05-25 NOTE — Progress Notes (Signed)
Doxorubicin given as ordered.  Blood return noted before, during and after.  Pt denies any discomfort at pot.  No s/s of extravasation noted.

## 2021-05-25 NOTE — Progress Notes (Signed)
Per Dr Jana Hakim continue [facility] at '50mg'$ /m2 and '500mg'$ /m2

## 2021-05-25 NOTE — Patient Instructions (Signed)
[facility]  Discharge Instructions: Thank you for choosing [facility] to provide your oncology and hematology care.   If you have a lab appointment with the [facility], please go directly to the [facility] and check in at the registration area.   Wear comfortable clothing and clothing appropriate for easy access to any Portacath or PICC line.   We strive to give you quality time with your provider. You may need to reschedule your appointment if you arrive late (15 or more minutes).  Arriving late affects you and other patients whose appointments are after yours.  Also, if you miss three or more appointments without notifying the office, you may be dismissed from the clinic at the provider's discretion.      For prescription refill requests, have your pharmacy contact our office and allow 72 hours for refills to be completed.    Today you received the following chemotherapy and/or immunotherapy agents Keytruda, Doxorubicin, and Cytoxan      To help prevent nausea and vomiting after your treatment, we encourage you to take your nausea medication as directed.  BELOW ARE SYMPTOMS THAT SHOULD BE REPORTED IMMEDIATELY: *FEVER GREATER THAN 100.4 F (38 C) OR HIGHER *CHILLS OR SWEATING *NAUSEA AND VOMITING THAT IS NOT CONTROLLED WITH YOUR NAUSEA MEDICATION *UNUSUAL SHORTNESS OF BREATH *UNUSUAL BRUISING OR BLEEDING *URINARY PROBLEMS (pain or burning when urinating, or frequent urination) *BOWEL PROBLEMS (unusual diarrhea, constipation, pain near the anus) TENDERNESS IN MOUTH AND THROAT WITH OR WITHOUT PRESENCE OF ULCERS (sore throat, sores in mouth, or a toothache) UNUSUAL RASH, SWELLING OR PAIN  UNUSUAL VAGINAL DISCHARGE OR ITCHING   Items with * indicate a potential emergency and should be followed up as soon as possible or go to the Emergency Department if any problems should occur.  Please show the CHEMOTHERAPY ALERT CARD or IMMUNOTHERAPY  ALERT CARD at check-in to the Emergency Department and triage nurse.  Should you have questions after your visit or need to cancel or reschedule your appointment, please contact [facility]  Dept: [phone]  and follow the prompts.  Office hours are 8:00 a.m. to 4:30 p.m. Monday - Friday. Please note that voicemails left after 4:00 p.m. may not be returned until the following business day.  We are closed weekends and major holidays. You have access to a nurse at all times for urgent questions. Please call the main number to the clinic Dept: [phone] and follow the prompts.   For any non-urgent questions, you may also contact your provider using MyChart. We now offer e-Visits for anyone 74 and older to request care online for non-urgent symptoms. For details visit [email or web address].   Also download the MyChart app! Go to the app store, search "MyChart", open the app, select [facility], and log in with your MyChart username and password.  Due to Covid, a mask is required upon entering the hospital/clinic. If you do not have a mask, one will be given to you upon arrival. For doctor visits, patients may have 1 support person aged 46 or older with them. For treatment visits, patients cannot have anyone with them due to current Covid guidelines and our immunocompromised population.

## 2021-05-26 DIAGNOSIS — J9601 Acute respiratory failure with hypoxia: Secondary | ICD-10-CM | POA: Diagnosis not present

## 2021-05-27 ENCOUNTER — Inpatient Hospital Stay: Payer: 59

## 2021-05-27 ENCOUNTER — Other Ambulatory Visit: Payer: Self-pay

## 2021-05-27 VITALS — BP 108/69 | HR 71 | Temp 97.1°F | Resp 18

## 2021-05-27 DIAGNOSIS — C50812 Malignant neoplasm of overlapping sites of left female breast: Secondary | ICD-10-CM

## 2021-05-27 DIAGNOSIS — Z170 Estrogen receptor positive status [ER+]: Secondary | ICD-10-CM | POA: Diagnosis not present

## 2021-05-27 DIAGNOSIS — Z171 Estrogen receptor negative status [ER-]: Secondary | ICD-10-CM | POA: Diagnosis not present

## 2021-05-27 DIAGNOSIS — Z79899 Other long term (current) drug therapy: Secondary | ICD-10-CM | POA: Diagnosis not present

## 2021-05-27 DIAGNOSIS — C50811 Malignant neoplasm of overlapping sites of right female breast: Secondary | ICD-10-CM | POA: Diagnosis not present

## 2021-05-27 DIAGNOSIS — Z5112 Encounter for antineoplastic immunotherapy: Secondary | ICD-10-CM | POA: Diagnosis not present

## 2021-05-27 DIAGNOSIS — Z5189 Encounter for other specified aftercare: Secondary | ICD-10-CM | POA: Diagnosis not present

## 2021-05-27 DIAGNOSIS — Z5111 Encounter for antineoplastic chemotherapy: Secondary | ICD-10-CM | POA: Diagnosis not present

## 2021-05-27 DIAGNOSIS — C50511 Malignant neoplasm of lower-outer quadrant of right female breast: Secondary | ICD-10-CM

## 2021-05-27 MED ORDER — PEGFILGRASTIM-BMEZ 6 MG/0.6ML ~~LOC~~ SOSY
PREFILLED_SYRINGE | SUBCUTANEOUS | Status: AC
  Filled 2021-05-27: qty 0.6

## 2021-05-27 MED ORDER — PEGFILGRASTIM-BMEZ 6 MG/0.6ML ~~LOC~~ SOSY
6.0000 mg | PREFILLED_SYRINGE | Freq: Once | SUBCUTANEOUS | Status: AC
  Administered 2021-05-27: 6 mg via SUBCUTANEOUS

## 2021-06-02 ENCOUNTER — Encounter: Payer: Self-pay | Admitting: *Deleted

## 2021-06-09 ENCOUNTER — Encounter: Payer: Self-pay | Admitting: *Deleted

## 2021-06-14 MED FILL — Dexamethasone Sodium Phosphate Inj 100 MG/10ML: INTRAMUSCULAR | Qty: 1 | Status: AC

## 2021-06-14 MED FILL — Fosaprepitant Dimeglumine For IV Infusion 150 MG (Base Eq): INTRAVENOUS | Qty: 5 | Status: AC

## 2021-06-15 ENCOUNTER — Inpatient Hospital Stay: Payer: 59 | Admitting: Adult Health

## 2021-06-15 ENCOUNTER — Inpatient Hospital Stay: Payer: 59

## 2021-06-15 ENCOUNTER — Inpatient Hospital Stay: Payer: 59 | Attending: Oncology

## 2021-06-15 ENCOUNTER — Encounter: Payer: Self-pay | Admitting: *Deleted

## 2021-06-15 ENCOUNTER — Other Ambulatory Visit: Payer: Self-pay

## 2021-06-15 VITALS — BP 109/68 | HR 113 | Temp 97.8°F | Resp 18 | Ht 67.0 in | Wt 273.0 lb

## 2021-06-15 VITALS — HR 98

## 2021-06-15 DIAGNOSIS — Z5112 Encounter for antineoplastic immunotherapy: Secondary | ICD-10-CM | POA: Insufficient documentation

## 2021-06-15 DIAGNOSIS — Z171 Estrogen receptor negative status [ER-]: Secondary | ICD-10-CM

## 2021-06-15 DIAGNOSIS — M539 Dorsopathy, unspecified: Secondary | ICD-10-CM

## 2021-06-15 DIAGNOSIS — C50812 Malignant neoplasm of overlapping sites of left female breast: Secondary | ICD-10-CM | POA: Diagnosis not present

## 2021-06-15 DIAGNOSIS — C50412 Malignant neoplasm of upper-outer quadrant of left female breast: Secondary | ICD-10-CM | POA: Diagnosis not present

## 2021-06-15 DIAGNOSIS — L739 Follicular disorder, unspecified: Secondary | ICD-10-CM

## 2021-06-15 DIAGNOSIS — Z5189 Encounter for other specified aftercare: Secondary | ICD-10-CM | POA: Diagnosis not present

## 2021-06-15 DIAGNOSIS — Z170 Estrogen receptor positive status [ER+]: Secondary | ICD-10-CM | POA: Diagnosis not present

## 2021-06-15 DIAGNOSIS — C50511 Malignant neoplasm of lower-outer quadrant of right female breast: Secondary | ICD-10-CM

## 2021-06-15 DIAGNOSIS — Z79899 Other long term (current) drug therapy: Secondary | ICD-10-CM | POA: Diagnosis not present

## 2021-06-15 DIAGNOSIS — Z5111 Encounter for antineoplastic chemotherapy: Secondary | ICD-10-CM | POA: Diagnosis not present

## 2021-06-15 DIAGNOSIS — Z95828 Presence of other vascular implants and grafts: Secondary | ICD-10-CM

## 2021-06-15 DIAGNOSIS — K760 Fatty (change of) liver, not elsewhere classified: Secondary | ICD-10-CM

## 2021-06-15 LAB — COMPREHENSIVE METABOLIC PANEL
ALT: 11 U/L (ref 0–44)
AST: 15 U/L (ref 15–41)
Albumin: 3.6 g/dL (ref 3.5–5.0)
Alkaline Phosphatase: 64 U/L (ref 38–126)
Anion gap: 13 (ref 5–15)
BUN: 12 mg/dL (ref 6–20)
CO2: 26 mmol/L (ref 22–32)
Calcium: 9.6 mg/dL (ref 8.9–10.3)
Chloride: 104 mmol/L (ref 98–111)
Creatinine, Ser: 1.29 mg/dL — ABNORMAL HIGH (ref 0.44–1.00)
GFR, Estimated: 49 mL/min — ABNORMAL LOW (ref >60)
Glucose, Bld: 104 mg/dL — ABNORMAL HIGH (ref 70–99)
Potassium: 3.2 mmol/L — ABNORMAL LOW (ref 3.5–5.1)
Sodium: 143 mmol/L (ref 135–145)
Total Bilirubin: 0.4 mg/dL (ref 0.3–1.2)
Total Protein: 6.9 g/dL (ref 6.5–8.1)

## 2021-06-15 LAB — CBC WITH DIFFERENTIAL/PLATELET
Abs Immature Granulocytes: 0.11 10*3/uL — ABNORMAL HIGH (ref 0.00–0.07)
Basophils Absolute: 0 10*3/uL (ref 0.0–0.1)
Basophils Relative: 1 %
Eosinophils Absolute: 0 10*3/uL (ref 0.0–0.5)
Eosinophils Relative: 0 %
HCT: 26.9 % — ABNORMAL LOW (ref 36.0–46.0)
Hemoglobin: 8.7 g/dL — ABNORMAL LOW (ref 12.0–15.0)
Immature Granulocytes: 1 %
Lymphocytes Relative: 10 %
Lymphs Abs: 0.8 10*3/uL (ref 0.7–4.0)
MCH: 31.4 pg (ref 26.0–34.0)
MCHC: 32.3 g/dL (ref 30.0–36.0)
MCV: 97.1 fL (ref 80.0–100.0)
Monocytes Absolute: 1.2 10*3/uL — ABNORMAL HIGH (ref 0.1–1.0)
Monocytes Relative: 15 %
Neutro Abs: 6 10*3/uL (ref 1.7–7.7)
Neutrophils Relative %: 73 %
Platelets: 252 10*3/uL (ref 150–400)
RBC: 2.77 MIL/uL — ABNORMAL LOW (ref 3.87–5.11)
RDW: 17.4 % — ABNORMAL HIGH (ref 11.5–15.5)
WBC: 8.1 10*3/uL (ref 4.0–10.5)
nRBC: 0.5 % — ABNORMAL HIGH (ref 0.0–0.2)

## 2021-06-15 LAB — T4, FREE: Free T4: 0.84 ng/dL (ref 0.61–1.12)

## 2021-06-15 LAB — TSH: TSH: 1.306 u[IU]/mL (ref 0.308–3.960)

## 2021-06-15 MED ORDER — ALTEPLASE 2 MG IJ SOLR: 2.0000 mg | Freq: Once | INTRAMUSCULAR | Status: AC

## 2021-06-15 MED ORDER — SODIUM CHLORIDE 0.9% FLUSH
10.0000 mL | INTRAVENOUS | Status: DC | PRN
Start: 2021-06-15 — End: 2021-06-15
  Administered 2021-06-15: 10 mL

## 2021-06-15 MED ORDER — SODIUM CHLORIDE 0.9 % IV SOLN
150.0000 mg | Freq: Once | INTRAVENOUS | Status: AC
  Administered 2021-06-15: 150 mg via INTRAVENOUS
  Filled 2021-06-15: qty 150

## 2021-06-15 MED ORDER — ALTEPLASE 2 MG IJ SOLR
INTRAMUSCULAR | Status: AC
  Administered 2021-06-15: 2 mg
  Filled 2021-06-15: qty 2

## 2021-06-15 MED ORDER — DOXORUBICIN HCL CHEMO IV INJECTION 2 MG/ML
50.0000 mg/m2 | Freq: Once | INTRAVENOUS | Status: AC
Start: 2021-06-15 — End: 2021-06-15
  Administered 2021-06-15: 130 mg via INTRAVENOUS
  Filled 2021-06-15: qty 65

## 2021-06-15 MED ORDER — PALONOSETRON HCL INJECTION 0.25 MG/5ML: 0.2500 mg | Freq: Once | INTRAVENOUS | Status: AC

## 2021-06-15 MED ORDER — HEPARIN SOD (PORK) LOCK FLUSH 100 UNIT/ML IV SOLN
500.0000 [IU] | Freq: Once | INTRAVENOUS | Status: AC | PRN
  Administered 2021-06-15: 500 [IU]

## 2021-06-15 MED ORDER — PALONOSETRON HCL INJECTION 0.25 MG/5ML
INTRAVENOUS | Status: AC
  Administered 2021-06-15: 0.25 mg via INTRAVENOUS
  Filled 2021-06-15: qty 5

## 2021-06-15 MED ORDER — DEXAMETHASONE SODIUM PHOSPHATE 100 MG/10ML IJ SOLN
10.0000 mg | Freq: Once | INTRAMUSCULAR | Status: AC
  Administered 2021-06-15: 10 mg via INTRAVENOUS
  Filled 2021-06-15: qty 10

## 2021-06-15 MED ORDER — CYCLOPHOSPHAMIDE CHEMO INJECTION 1 GM
500.0000 mg/m2 | Freq: Once | INTRAMUSCULAR | Status: AC
  Administered 2021-06-15: 1300 mg via INTRAVENOUS
  Filled 2021-06-15: qty 65

## 2021-06-15 MED ORDER — SODIUM CHLORIDE 0.9 % IV SOLN
200.0000 mg | Freq: Once | INTRAVENOUS | Status: AC
  Administered 2021-06-15: 200 mg via INTRAVENOUS
  Filled 2021-06-15: qty 8

## 2021-06-15 MED ORDER — SODIUM CHLORIDE 0.9% FLUSH
10.0000 mL | Freq: Once | INTRAVENOUS | Status: AC
  Administered 2021-06-15: 10 mL

## 2021-06-15 MED ORDER — SODIUM CHLORIDE 0.9 % IV SOLN
Freq: Once | INTRAVENOUS | Status: AC
Start: 2021-06-15 — End: 2021-06-15

## 2021-06-15 NOTE — Progress Notes (Signed)
[facility]  [phone] [phone]    ID: Leslie Wright DOB: Feb 26, 1966  MR#: [id number]  CXK#:[id number]  Patient Care Team: Fanny Bien, MD as PCP - General (Family Medicine) Herminio Commons, MD (Inactive) as PCP - Cardiology (Cardiology) Mauro Kaufmann, RN as Oncology Nurse Navigator Rockwell Germany, RN as Oncology Nurse Navigator Stark Klein, MD as Consulting Physician (General Surgery) Kyung Rudd, MD as Consulting Physician (Radiation Oncology) Scot Dock, NP OTHER MD:  CHIEF COMPLAINT: Bilateral breast cancers  CURRENT TREATMENT: Neoadjuvant chemotherapy   INTERVAL HISTORY: Leslie Wright returns today for follow up and treatment of her bilateral breast cancers.   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 On her cycle 1 day 8 visit she developed febrile neutropenia and was hospitalized from 04/20/2021 through 04/25/2021.    She is here today for her final cycle of treatment.  She is tolerating this well, and is fatigued, and mildly nauseated, however feels better than she did prior to her first cycle.  She reports a sluggish activity level, however she is still trying to do things.     REVIEW OF SYSTEMS: Review of Systems  Constitutional:  Positive for fatigue. Negative for appetite change, chills, fever and unexpected weight change.  HENT:   Negative for hearing loss, lump/mass and trouble swallowing.   Eyes:  Negative for eye problems and icterus.  Respiratory:  Negative for chest tightness, cough and shortness of breath.   Cardiovascular:  Negative for chest pain, leg swelling and palpitations.  Gastrointestinal:  Negative for abdominal distention, abdominal pain, constipation, diarrhea, nausea and vomiting.  Endocrine: Negative for hot flashes.  Genitourinary:  Negative for difficulty urinating.    Musculoskeletal:  Negative for arthralgias.  Skin:  Negative for itching and rash.  Neurological:  Negative for dizziness, extremity weakness, headaches and numbness.  Hematological:  Negative for adenopathy. Does not bruise/bleed easily.  Psychiatric/Behavioral:  Negative for depression. The patient is not nervous/anxious.      COVID 19 VACCINATION STATUS: Status post [place] x2, no booster as of 01/24/2021   HISTORY OF CURRENT ILLNESS: From the original intake note:  SAMAURI KELLENBERGER" presented with a palpable left breast lump. She underwent bilateral diagnostic mammography with tomography and bilateral breast ultrasonography at [facility] on 01/12/2021 showing: breast density category C; 5.8 cm mass in the left breast at 12:30 with associated calcifications; additional 1.2 cm group of calcifications in the outer left breast;   On the right: Multiple small masses and irregular ducts in the retroareolar right breast, including a 0.8 cm representative mass at 6 o'clock; indeterminate 0.4 cm mass in upper-inner right breast; normal lymph nodes bilaterally.  Accordingly on 01/16/2021 she proceeded to biopsy of the right breast areas in question. The pathology from this procedure ([id number]) showed:  1. Right Breast, 6 o'clock  - invasive ductal carcinoma, grade 2. Prognostic indicators significant for: estrogen receptor, 90% positive with strong staining intensity and progesterone receptor, 0% negative. Proliferation marker Ki67 at 5%. HER2 equivocal by immunohistochemistry (2+), but negative by fluorescent in situ hybridization with a signals ratio 1.61 and number per cell 2.5. 2. Right Breast, 9 o'clock  - fibrocystic change with adenosis, calcifications, and a small intraductal papilloma.  She underwent biopsy of the left breast areas on 01/20/2021. Pathology [id number]) revealed: 1. Left Breast, upper-outer quadrant  - invasive ductal carcinoma, grade 3 2. Left Breast, central,  slightly lateral to midline  - ductal carcinoma in situ with necrosis and calcifications, intermediate grade Prognostic panels are pending on both samples.  Cancer Staging Malignant neoplasm of lower-outer quadrant of right breast of female, estrogen receptor positive ([place]) Staging form: Breast, AJCC 8th Edition - Clinical stage from 01/20/2021: Stage IA (cT1c, cN0, cM0, G2, ER+, PR-, HER2-) - Signed by Chauncey Cruel, MD on 01/25/2021 Stage prefix: Initial diagnosis Histologic grading system: 3 grade system  Malignant neoplasm of overlapping sites of left breast in female, estrogen receptor negative ([place]) Staging form: Breast, AJCC 8th Edition - Clinical stage from 01/20/2021: Stage IIIB (cT3, cN0, cM0, G3, ER-, PR-, HER2-) - Signed by Chauncey Cruel, MD on 01/25/2021 Histologic grading system: 3 grade system  The patient's subsequent history is as detailed below.   PAST MEDICAL HISTORY: Past Medical History:  Diagnosis Date   Acid reflux    Anemia    Arthritis    "maybe in my fingers" (03/26/2018)   Breast cancer ([place]) 12/2020   both sides   Concussion 2001   no deficit had tia right after no problems since   Family history of adverse reaction to anesthesia    "brother, Timmy, and my mom always get sick" (03/26/2018)   Family history of brain cancer    Family history of breast cancer    Family history of colon cancer    Family history of lung cancer    Family history of pancreatic cancer    Family history of prostate cancer    Headache    High blood pressure    High cholesterol    Hip pain    "left; before OR" (03/26/2018)   History of chemotherapy last done 02-17-2021   History of hiatal hernia    History of kidney stones    OSA (obstructive sleep apnea)    "can't tolerate the mask" (03/26/2018)    PONV (postoperative nausea and vomiting)    does not need much anesthesia, slow to awlaekn in past   Pre-diabetes    Shortness of breath    with exertion   Wears  partial dentures upper    PAST SURGICAL HISTORY: Past Surgical History:  Procedure Laterality Date   CARPAL TUNNEL RELEASE Right ~ 2006   JOINT REPLACEMENT     LAPAROSCOPIC CHOLECYSTECTOMY  12/2010   LASIK Bilateral 2000   PORT A CATH REVISION Left 02/21/2021   Procedure: PORT A CATH REVISION;  Surgeon: Stark Klein, MD;  Location: [facility];  Service: General;  Laterality: Left;  60 MINUTES ROOM 5   PORTACATH PLACEMENT N/A 02/08/2021   Procedure: INSERTION PORT-A-CATH;  Surgeon: Stark Klein, MD;  Location: [facility];  Service: General;  Laterality: N/A;   TOTAL HIP ARTHROPLASTY Left 03/25/2018   Procedure: LEFT TOTAL HIP ARTHROPLASTY ANTERIOR APPROACH;  Surgeon: Mcarthur Rossetti, MD;  Location: [facility];  Service: Orthopedics;  Laterality: Left;    FAMILY HISTORY: Family History  Problem Relation Age of Onset   Diabetes Mother    Thyroid disease Mother    Liver disease Mother    Obesity Mother    Hypertension Father    Hyperlipidemia Father    Heart disease Father    Stroke Father    Obesity Father    Colon polyps Father    Prostate cancer Brother    Brain cancer Maternal Grandfather    Pancreatic cancer Paternal Grandmother    Colon cancer Neg Hx  No one in family with colon cancer.  The patient's father is 41 years old as of 29-Jan-2021.  The patient's mother died from cirrhosis associated with hepatitis C (from a transfusion) age 45.  The patient has 3 brothers, no sisters.  1 brother has a history of prostate cancer at age 51.  A maternal grandfather had a history of central nervous system carcinoma.  A paternal grandmother had a history of pancreatic cancer and a maternal aunt had a history of breast cancer in her late 31s.   GYNECOLOGIC HISTORY:  No LMP recorded (lmp unknown). Patient is postmenopausal. Menarche: 55 years old [place] P 0 LMP 53 HRT no  Hysterectomy? no BSO? no   SOCIAL HISTORY: (updated 2021-01-29)  Rosaria Ferries "Leslie Wright" works for [employer] in [employer].  She works from home.  She is a Engineer, mining.  She describes herself as single, lives by herself, with a toy poodle and a [place] as well as 2 cats.   ADVANCED DIRECTIVES: The patient's brother Adrienna Karis is her healthcare power of attorney.  He can be reached at [phone]   HEALTH MAINTENANCE: Social History   Tobacco Use   Smoking status: Former    Types: Cigarettes   Smokeless tobacco: Never   Tobacco comments:    In teens social  Vaping Use   Vaping Use: Never used  Substance Use Topics   Alcohol use: Yes    Comment: Occasional   Drug use: Never     Colonoscopy:   PAP: 03/2011  Bone density: scheduled for 07/2021   No Known Allergies  Current Outpatient Medications  Medication Sig Dispense Refill   acetaminophen (TYLENOL) 500 MG tablet Take 1,000 mg by mouth every 6 (six) hours as needed for moderate pain.     aspirin EC 81 MG tablet Take 81 mg by mouth daily.     azelastine (ASTELIN) 0.1 % nasal spray Use 1 - 2 sprays each nostril 1 - 2 times a day for nasal congestion 30 mL 11   benzonatate (TESSALON) 200 MG capsule Take 1 capsule by mouth 3 times a day when needed for cough 60 capsule 0   betamethasone valerate ointment (VALISONE) 0.1 % Apply 1 application topically 2 (two) times daily. (Patient taking differently: Apply 1 application topically 2 (two) times daily as needed (rash).) 30 g 0   Blood Glucose Monitoring Suppl (FREESTYLE LITE) w/Device KIT CHECK GLUCOSE 2-3 TIMES DAILY 1 kit 0   Cholecalciferol (VITAMIN D) 50 MCG (2000 UT) CAPS Take 2,000 Units by mouth daily.     dexamethasone (DECADRON) 4 MG tablet Take 2 tablets once a day for 3 days after carboplatin and AC chemotherapy. Take with food. (Patient taking differently: Take 4 mg by mouth See admin instructions. Take 2 tablets once a day for 3 days after carboplatin and AC chemotherapy. Take with food.) 30 tablet 1   fluconazole (DIFLUCAN) 100 MG tablet Take 1 tablet (100 mg  total) by mouth daily for 4 days, then as needed 10 tablet 0   folic acid (FOLVITE) 1 MG tablet Take 1 mg by mouth daily.     glucose blood test strip USE AS DIRECTED TO CHECK BLOOD SUGAR 1 TO 3 TIMES A DAY (Patient taking differently: USE AS DIRECTED TO CHECK BLOOD SUGAR 1 TO 3 TIMES A DAY) 100 strip 3   ketoconazole (NIZORAL) 2 % cream Apply 1 application topically daily. (Patient taking differently: Apply 1 application topically daily as needed for irritation.) 15  g 0   Lancets (FREESTYLE) lancets USE AS DIRECTED TO CHECK BLOOD SUGAR 2 TO 3 TIMES A DAY (Patient taking differently: USE AS DIRECTED TO CHECK BLOOD SUGAR 2 TO 3 TIMES A DAY) 100 each 3   lidocaine-prilocaine (EMLA) cream Apply to affected area once (Patient taking differently: Apply 1 application topically daily as needed (port access).) 30 g 3   losartan (COZAAR) 100 MG tablet Take 1 tablet by mouth daily (Patient taking differently: Take 50 mg by mouth daily.) 90 tablet 3   Multiple Vitamin (MULTIVITAMIN WITH MINERALS) TABS tablet Take 1 tablet by mouth daily.     omeprazole (PRILOSEC) 20 MG capsule Take 20 mg by mouth daily.     omeprazole (PRILOSEC) 40 MG capsule Take 1 capsule (40 mg total) by mouth at bedtime. 60 capsule 0   ondansetron (ZOFRAN) 8 MG tablet Take 1 tablet (8 mg total) by mouth 2 (two) times daily as needed. Start on the third day after carboplatin and AC chemotherapy. (Patient taking differently: Take 8 mg by mouth 2 (two) times daily as needed for nausea or vomiting. Start on the third day after carboplatin and AC chemotherapy.) 30 tablet 1   ondansetron (ZOFRAN) 8 MG tablet Take 1 tablet (8 mg total) by mouth every 8 (eight) hours as needed for nausea or vomiting. Do not take days 1 and 2 after chemotherapy (Start day 3 after Chemotherapy.) 20 tablet 0   prochlorperazine (COMPAZINE) 10 MG tablet Take 1 tablet (10 mg total) by mouth every 6 (six) hours as needed (Nausea or vomiting). 30 tablet 1   rosuvastatin  (CRESTOR) 10 MG tablet TAKE 1 TABLET BY MOUTH ONCE A DAY AT BEDTIME FOR CHOLESTEROL (Patient taking differently: Take 10 mg by mouth at bedtime.) 90 tablet 1   No current facility-administered medications for this visit.    OBJECTIVE: White woman who appears stated age  23:   06/15/21 1045  BP: 109/68  Pulse: (!) 113  Resp: 18  Temp: 97.8 F (36.6 C)  SpO2: 100%        Body mass index is 42.76 kg/m.   Wt Readings from Last 3 Encounters:  06/15/21 273 lb (123.8 kg)  05/25/21 279 lb (126.6 kg)  05/10/21 281 lb 1.9 oz (127.5 kg)  GENERAL: Patient is a well appearing female in no acute distress HEENT:  Sclerae anicteric.  Oropharynx clear and moist. No ulcerations or evidence of oropharyngeal candidiasis. Neck is supple.  NODES:  No cervical, supraclavicular, or axillary lymphadenopathy palpated.  BREAST EXAM:  Deferred. LUNGS:  Clear to auscultation bilaterally.  No wheezes or rhonchi. HEART:  Regular rate and rhythm. No murmur appreciated. ABDOMEN:  Soft, nontender.  Positive, normoactive bowel sounds. No organomegaly palpated. MSK:  No focal spinal tenderness to palpation. Full range of motion bilaterally in the upper extremities. EXTREMITIES:  No peripheral edema.   SKIN:  Clear with no obvious rashes or skin changes. No nail dyscrasia. NEURO:  Nonfocal. Well oriented.  Appropriate affect.    LAB RESULTS:  CMP     Component Value Date/Time   NA 140 05/25/2021 1404   NA 143 12/02/2018 1138   K 3.9 05/25/2021 1404   CL 106 05/25/2021 1404   CO2 23 05/25/2021 1404   GLUCOSE 122 (H) 05/25/2021 1404   BUN 12 05/25/2021 1404   BUN 17 12/02/2018 1138   CREATININE 1.35 (H) 05/25/2021 1404   CREATININE 0.92 05/04/2021 1042   CREATININE 1.09 (H) 02/18/2019 1524   CALCIUM 9.5  05/25/2021 1404   PROT 6.9 05/25/2021 1404   PROT 6.9 12/02/2018 1138   ALBUMIN 3.5 05/25/2021 1404   ALBUMIN 4.0 12/02/2018 1138   AST 16 05/25/2021 1404   AST 17 05/04/2021 1042   ALT 19  05/25/2021 1404   ALT 23 05/04/2021 1042   ALKPHOS 68 05/25/2021 1404   BILITOT 0.4 05/25/2021 1404   BILITOT 0.4 05/04/2021 1042   GFRNONAA 46 (L) 05/25/2021 1404   GFRNONAA >60 05/04/2021 1042   GFRAA 70 12/02/2018 1138    No results found for: TOTALPROTELP, ALBUMINELP, A1GS, A2GS, BETS, BETA2SER, GAMS, MSPIKE, SPEI  Lab Results  Component Value Date   WBC 8.1 06/15/2021   NEUTROABS 6.0 06/15/2021   HGB 8.7 (L) 06/15/2021   HCT 26.9 (L) 06/15/2021   MCV 97.1 06/15/2021   PLT 252 06/15/2021    No results found for: LABCA2  No components found for: XBMWUX324  No results for input(s): INR in the last 168 hours.  No results found for: LABCA2  No results found for: MWN027  No results found for: OZD664  No results found for: QIH474  No results found for: CA2729  No components found for: HGQUANT  No results found for: CEA1 / No results found for: CEA1   No results found for: AFPTUMOR  No results found for: CHROMOGRNA  No results found for: KPAFRELGTCHN, LAMBDASER, KAPLAMBRATIO (kappa/lambda light chains)  No results found for: HGBA, HGBA2QUANT, HGBFQUANT, HGBSQUAN (Hemoglobinopathy evaluation)   Lab Results  Component Value Date   LDH 283 (H) 04/23/2021    No results found for: IRON, TIBC, IRONPCTSAT (Iron and TIBC)  No results found for: FERRITIN  Urinalysis    Component Value Date/Time   COLORURINE YELLOW 04/20/2021 1505   APPEARANCEUR HAZY (A) 04/20/2021 1505   LABSPEC 1.015 04/20/2021 1505   PHURINE 5.0 04/20/2021 1505   GLUCOSEU NEGATIVE 04/20/2021 1505   HGBUR MODERATE (A) 04/20/2021 1505   BILIRUBINUR NEGATIVE 04/20/2021 1505   KETONESUR NEGATIVE 04/20/2021 1505   PROTEINUR NEGATIVE 04/20/2021 1505   UROBILINOGEN 0.2 10/23/2010 2031   NITRITE NEGATIVE 04/20/2021 1505   LEUKOCYTESUR NEGATIVE 04/20/2021 1505    STUDIES: No results found.   ELIGIBLE FOR AVAILABLE RESEARCH PROTOCOL:   ASSESSMENT: 55 y.o. [place] woman presenting with  bilateral breast cancers April 2022 as follows:  (1) in the right breast, a clinical T2 N0-1, stage IIA-IIB invasive ductal carcinoma, grade 2, estrogen receptor positive, progesterone receptor and HER2 negative, with an MIB-1 of 5%;  (a)  a second, retroareolar mass was benign but discordant; biopsy 02/07/2021  (b) borderline right axillary node not amenable to biopsy  (c) extensive patchy enhancement noted throughout the inferior right breast  (2) in the left breast, a clinical T3 N0, stage IIIB invasive ductal carcinoma, grade 3, functionally triple negative, with an MIB-1 of 25% (HER2 results are pending).  (a) secondary biopsy shows ductal carcinoma in situ associated with extensive patchy enhancement  (3) staging studies:  (a) chest CT scan 02/06/2021 shows hepatic steatosis and degenerative disc disease, no evidence of metastatic disease  (b) bone scan 02/06/2021 shows no evidence of metastatic disease  (4) neoadjuvant chemotherapy will start 02/09/2021 consisting of pembrolizumab/ carboplatin/ paclitaxel stopped after 3 cycles due to peripheral neuropathy followed by pembrolizumab/ doxorubicin/ cyclophosphamide x4 started 04/13/2021  (A) doxorubicin/cyclophosphamide dose reduced starting with cycle 2  (5) definitive surgery to follow  (6) adjuvant radiation to bilateral sites  (7) Negative genetic testing on 02/13/2021. VUS in ATM called c.1132A>G identified.  Genes tested include: AIP, ALK, APC, ATM, AXIN2,BAP1,  BARD1, BLM, BMPR1A, BRCA1, BRCA2, BRIP1, CASR, CDC73, CDH1, CDK4, CDKN1B, CDKN1C, CDKN2A (p14ARF), CDKN2A (p16INK4a), CEBPA, CHEK2, CTNNA1, DICER1, DIS3L2, EGFR (c.2369C>T, p.Thr790Met variant only), EPCAM (Deletion/duplication testing only), FH, FLCN, GATA2, GPC3, GREM1 (Promoter region deletion/duplication testing only), HOXB13 (c.251G>A, p.Gly84Glu), HRAS, KIT, MAX, MEN1, MET, MITF (c.952G>A, p.Glu318Lys variant only), MLH1, MSH2, MSH3, MSH6, MUTYH, NBN, NF1, NF2, NTHL1,  PALB2, PDGFRA, PHOX2B, PMS2, POLD1, POLE, POT1, PRKAR1A, PTCH1, PTEN, RAD50, RAD51C, RAD51D, RB1, RECQL4, RET, RUNX1, SDHAF2, SDHA (sequence changes only), SDHB, SDHC, SDHD, SMAD4, SMARCA4, SMARCB1, SMARCE1, STK11, SUFU, TERC, TERT, TMEM127, TP53, TSC1, TSC2, VHL, WRN and WT1.   PLAN: Leslie Wright will complete her neoadjuvant chemotherapy today.  I congratulated her on this.  She will proceed with breast MRI on 9/23 and f/u with Dr. Barry Dienes on 06/30/2021.  She says that she is unsure of what kind of surgery she plans on having, but would like to talk to Dr. Barry Dienes about reconstruction.  I went ahead and referred her to Dr. Claudia Desanctis so that she can meet with him and hear about her options.   Leslie Wright will return in 2 days for her growth factor injection.  She see Dr. Jana Hakim again on 07/13/2021.  She knows to call for any questions that may arise between now and her next appointment.  We are happy to see her sooner if needed.    Total encounter time 20 minutes.Wilber Bihari, NP 06/15/21 10:57 AM Medical Oncology and Hematology [facility] [place], [place] [address] Tel. [phone]    Fax. [phone]    *Total Encounter Time as defined by the Centers for Medicare and Medicaid Services includes, in addition to the face-to-face time of a patient visit (documented in the note above) non-face-to-face time: obtaining and reviewing outside history, ordering and reviewing medications, tests or procedures, care coordination (communications with other health care professionals or caregivers) and documentation in the medical record.

## 2021-06-15 NOTE — Progress Notes (Signed)
Per Wilber Bihari, NP - ok to reduce the Adria/Cytoxan back to her previous doses of 50/500 mg/m2.  Kennith Center, Pharm.D., CPP 06/15/2021'@12'$ :26 PM

## 2021-06-15 NOTE — Patient Instructions (Signed)
[facility]  Discharge Instructions: Thank you for choosing [facility] to provide your oncology and hematology care.   If you have a lab appointment with the [facility], please go directly to the [facility] and check in at the registration area.   Wear comfortable clothing and clothing appropriate for easy access to any Portacath or PICC line.   We strive to give you quality time with your provider. You may need to reschedule your appointment if you arrive late (15 or more minutes).  Arriving late affects you and other patients whose appointments are after yours.  Also, if you miss three or more appointments without notifying the office, you may be dismissed from the clinic at the provider's discretion.      For prescription refill requests, have your pharmacy contact our office and allow 72 hours for refills to be completed.    Today you received the following chemotherapy and/or immunotherapy agents Keytruda, Doxorubicin, and Cytoxan      To help prevent nausea and vomiting after your treatment, we encourage you to take your nausea medication as directed.  BELOW ARE SYMPTOMS THAT SHOULD BE REPORTED IMMEDIATELY: *FEVER GREATER THAN 100.4 F (38 C) OR HIGHER *CHILLS OR SWEATING *NAUSEA AND VOMITING THAT IS NOT CONTROLLED WITH YOUR NAUSEA MEDICATION *UNUSUAL SHORTNESS OF BREATH *UNUSUAL BRUISING OR BLEEDING *URINARY PROBLEMS (pain or burning when urinating, or frequent urination) *BOWEL PROBLEMS (unusual diarrhea, constipation, pain near the anus) TENDERNESS IN MOUTH AND THROAT WITH OR WITHOUT PRESENCE OF ULCERS (sore throat, sores in mouth, or a toothache) UNUSUAL RASH, SWELLING OR PAIN  UNUSUAL VAGINAL DISCHARGE OR ITCHING   Items with * indicate a potential emergency and should be followed up as soon as possible or go to the Emergency Department if any problems should occur.  Please show the CHEMOTHERAPY ALERT CARD or IMMUNOTHERAPY  ALERT CARD at check-in to the Emergency Department and triage nurse.  Should you have questions after your visit or need to cancel or reschedule your appointment, please contact [facility]  Dept: [phone]  and follow the prompts.  Office hours are 8:00 a.m. to 4:30 p.m. Monday - Friday. Please note that voicemails left after 4:00 p.m. may not be returned until the following business day.  We are closed weekends and major holidays. You have access to a nurse at all times for urgent questions. Please call the main number to the clinic Dept: [phone] and follow the prompts.   For any non-urgent questions, you may also contact your provider using MyChart. We now offer e-Visits for anyone 28 and older to request care online for non-urgent symptoms. For details visit [email or web address].   Also download the MyChart app! Go to the app store, search "MyChart", open the app, select [facility], and log in with your MyChart username and password.  Due to Covid, a mask is required upon entering the hospital/clinic. If you do not have a mask, one will be given to you upon arrival. For doctor visits, patients may have 1 support person aged 80 or older with them. For treatment visits, patients cannot have anyone with them due to current Covid guidelines and our immunocompromised population.

## 2021-06-16 ENCOUNTER — Encounter: Payer: Self-pay | Admitting: Adult Health

## 2021-06-16 ENCOUNTER — Encounter: Payer: Self-pay | Admitting: Hematology and Oncology

## 2021-06-16 ENCOUNTER — Encounter: Payer: Self-pay | Admitting: Oncology

## 2021-06-17 ENCOUNTER — Other Ambulatory Visit: Payer: Self-pay

## 2021-06-17 ENCOUNTER — Inpatient Hospital Stay: Payer: 59

## 2021-06-17 VITALS — BP 121/69 | HR 76 | Temp 98.2°F | Resp 17

## 2021-06-17 DIAGNOSIS — Z171 Estrogen receptor negative status [ER-]: Secondary | ICD-10-CM

## 2021-06-17 DIAGNOSIS — Z170 Estrogen receptor positive status [ER+]: Secondary | ICD-10-CM

## 2021-06-17 DIAGNOSIS — C50812 Malignant neoplasm of overlapping sites of left female breast: Secondary | ICD-10-CM

## 2021-06-17 DIAGNOSIS — Z5112 Encounter for antineoplastic immunotherapy: Secondary | ICD-10-CM | POA: Diagnosis not present

## 2021-06-17 DIAGNOSIS — Z79899 Other long term (current) drug therapy: Secondary | ICD-10-CM | POA: Diagnosis not present

## 2021-06-17 DIAGNOSIS — C50511 Malignant neoplasm of lower-outer quadrant of right female breast: Secondary | ICD-10-CM

## 2021-06-17 DIAGNOSIS — Z5189 Encounter for other specified aftercare: Secondary | ICD-10-CM | POA: Diagnosis not present

## 2021-06-17 DIAGNOSIS — C50412 Malignant neoplasm of upper-outer quadrant of left female breast: Secondary | ICD-10-CM | POA: Diagnosis not present

## 2021-06-17 DIAGNOSIS — Z5111 Encounter for antineoplastic chemotherapy: Secondary | ICD-10-CM | POA: Diagnosis not present

## 2021-06-17 MED ORDER — PEGFILGRASTIM-BMEZ 6 MG/0.6ML ~~LOC~~ SOSY
PREFILLED_SYRINGE | SUBCUTANEOUS | Status: AC
  Filled 2021-06-17: qty 0.6

## 2021-06-17 MED ORDER — PEGFILGRASTIM-BMEZ 6 MG/0.6ML ~~LOC~~ SOSY
6.0000 mg | PREFILLED_SYRINGE | Freq: Once | SUBCUTANEOUS | Status: AC
Start: 2021-06-17 — End: 2021-06-17
  Administered 2021-06-17: 6 mg via SUBCUTANEOUS

## 2021-06-20 ENCOUNTER — Telehealth: Payer: Self-pay

## 2021-06-20 NOTE — Telephone Encounter (Signed)
RN left voicemail for call back.  DZ:[id number]

## 2021-06-20 NOTE — Telephone Encounter (Signed)
-----   Message from Gardenia Phlegm, NP sent at 06/15/2021 10:05 PM EDT ----- Please tell her to increase her potassium intake ----- Message ----- From: Interface, Lab In [facility] Sent: 06/15/2021  10:52 AM EDT To: Chauncey Cruel, MD

## 2021-06-21 DIAGNOSIS — Z23 Encounter for immunization: Secondary | ICD-10-CM | POA: Diagnosis not present

## 2021-06-21 DIAGNOSIS — Z0000 Encounter for general adult medical examination without abnormal findings: Secondary | ICD-10-CM | POA: Diagnosis not present

## 2021-06-23 ENCOUNTER — Ambulatory Visit
Admission: RE | Admit: 2021-06-23 | Discharge: 2021-06-23 | Disposition: A | Discharge status: Home / Self Care | Payer: 59 | Source: Ambulatory Visit | Attending: Oncology | Admitting: Oncology

## 2021-06-23 ENCOUNTER — Other Ambulatory Visit: Payer: Self-pay

## 2021-06-23 DIAGNOSIS — Z171 Estrogen receptor negative status [ER-]: Secondary | ICD-10-CM

## 2021-06-23 DIAGNOSIS — N6320 Unspecified lump in the left breast, unspecified quadrant: Secondary | ICD-10-CM | POA: Diagnosis not present

## 2021-06-23 DIAGNOSIS — C50812 Malignant neoplasm of overlapping sites of left female breast: Secondary | ICD-10-CM

## 2021-06-23 DIAGNOSIS — C50412 Malignant neoplasm of upper-outer quadrant of left female breast: Secondary | ICD-10-CM | POA: Diagnosis not present

## 2021-06-23 DIAGNOSIS — C50911 Malignant neoplasm of unspecified site of right female breast: Secondary | ICD-10-CM | POA: Diagnosis not present

## 2021-06-23 DIAGNOSIS — Z86000 Personal history of in-situ neoplasm of breast: Secondary | ICD-10-CM | POA: Diagnosis not present

## 2021-06-23 MED ORDER — GADOBUTROL 1 MMOL/ML IV SOLN
10.0000 mL | Freq: Once | INTRAVENOUS | Status: AC | PRN
  Administered 2021-06-23: 10 mL via INTRAVENOUS

## 2021-06-26 DIAGNOSIS — J9601 Acute respiratory failure with hypoxia: Secondary | ICD-10-CM | POA: Diagnosis not present

## 2021-07-03 ENCOUNTER — Other Ambulatory Visit: Payer: Self-pay | Admitting: General Surgery

## 2021-07-03 ENCOUNTER — Encounter: Payer: Self-pay | Admitting: *Deleted

## 2021-07-03 DIAGNOSIS — C50511 Malignant neoplasm of lower-outer quadrant of right female breast: Secondary | ICD-10-CM

## 2021-07-03 DIAGNOSIS — Z170 Estrogen receptor positive status [ER+]: Secondary | ICD-10-CM | POA: Diagnosis not present

## 2021-07-03 DIAGNOSIS — C50812 Malignant neoplasm of overlapping sites of left female breast: Secondary | ICD-10-CM | POA: Diagnosis not present

## 2021-07-03 DIAGNOSIS — Z171 Estrogen receptor negative status [ER-]: Secondary | ICD-10-CM | POA: Diagnosis not present

## 2021-07-05 ENCOUNTER — Other Ambulatory Visit: Payer: Self-pay

## 2021-07-05 ENCOUNTER — Ambulatory Visit: Payer: 59 | Admitting: Plastic Surgery

## 2021-07-05 ENCOUNTER — Encounter: Payer: Self-pay | Admitting: Plastic Surgery

## 2021-07-05 VITALS — BP 100/69 | HR 106 | Ht 67.0 in | Wt 271.0 lb

## 2021-07-05 DIAGNOSIS — C50511 Malignant neoplasm of lower-outer quadrant of right female breast: Secondary | ICD-10-CM

## 2021-07-05 DIAGNOSIS — Z170 Estrogen receptor positive status [ER+]: Secondary | ICD-10-CM

## 2021-07-05 NOTE — Progress Notes (Signed)
Referring Provider Fanny Bien, MD [address],  [place] [address]   CC:  Chief Complaint  Patient presents with   Advice Only      Leslie Wright is an 55 y.o. female.  HPI: Patient presents to discuss management of bilateral breast cancers.  She has completed neoadjuvant chemotherapy.  She is planning bilateral mastectomies with Dr. Barry Dienes.  She is uncertain whether or not she wants reconstruction but would like to hear about it to make a more informed decision.  No Known Allergies  Outpatient Encounter Medications as of 07/05/2021  Medication Sig Note   acetaminophen (TYLENOL) 500 MG tablet Take 1,000 mg by mouth every 6 (six) hours as needed for moderate pain.    aspirin EC 81 MG tablet Take 81 mg by mouth daily.    azelastine (ASTELIN) 0.1 % nasal spray Use 1 - 2 sprays each nostril 1 - 2 times a day for nasal congestion    benzonatate (TESSALON) 200 MG capsule Take 1 capsule by mouth 3 times a day when needed for cough    betamethasone valerate ointment (VALISONE) 0.1 % Apply 1 application topically 2 (two) times daily. (Patient taking differently: Apply 1 application topically 2 (two) times daily as needed (rash).)    Blood Glucose Monitoring Suppl (FREESTYLE LITE) w/Device KIT CHECK GLUCOSE 2-3 TIMES DAILY    Cholecalciferol (VITAMIN D) 50 MCG (2000 UT) CAPS Take 2,000 Units by mouth daily.    dexamethasone (DECADRON) 4 MG tablet Take 2 tablets once a day for 3 days after carboplatin and AC chemotherapy. Take with food. (Patient taking differently: Take 4 mg by mouth See admin instructions. Take 2 tablets once a day for 3 days after carboplatin and AC chemotherapy. Take with food.)    folic acid (FOLVITE) 1 MG tablet Take 1 mg by mouth daily.    glucose blood test strip USE AS DIRECTED TO CHECK BLOOD SUGAR 1 TO 3 TIMES A DAY (Patient taking differently: USE AS DIRECTED TO CHECK BLOOD SUGAR 1 TO 3 TIMES A DAY)    ketoconazole (NIZORAL) 2 % cream Apply 1  application topically daily. (Patient taking differently: Apply 1 application topically daily as needed for irritation.)    Lancets (FREESTYLE) lancets USE AS DIRECTED TO CHECK BLOOD SUGAR 2 TO 3 TIMES A DAY (Patient taking differently: USE AS DIRECTED TO CHECK BLOOD SUGAR 2 TO 3 TIMES A DAY)    lidocaine-prilocaine (EMLA) cream Apply to affected area once (Patient taking differently: Apply 1 application topically daily as needed (port access).) 04/20/2021: never used   losartan (COZAAR) 100 MG tablet Take 1 tablet by mouth daily (Patient taking differently: Take 50 mg by mouth daily.)    Multiple Vitamin (MULTIVITAMIN WITH MINERALS) TABS tablet Take 1 tablet by mouth daily.    omeprazole (PRILOSEC) 40 MG capsule Take 1 capsule (40 mg total) by mouth at bedtime.    ondansetron (ZOFRAN) 8 MG tablet Take 1 tablet (8 mg total) by mouth 2 (two) times daily as needed. Start on the third day after carboplatin and AC chemotherapy. (Patient taking differently: Take 8 mg by mouth 2 (two) times daily as needed for nausea or vomiting. Start on the third day after carboplatin and AC chemotherapy.)    ondansetron (ZOFRAN) 8 MG tablet Take 1 tablet (8 mg total) by mouth every 8 (eight) hours as needed for nausea or vomiting. Do not take days 1 and 2 after chemotherapy (Start day 3 after Chemotherapy.)  prochlorperazine (COMPAZINE) 10 MG tablet Take 1 tablet (10 mg total) by mouth every 6 (six) hours as needed (Nausea or vomiting).    rosuvastatin (CRESTOR) 10 MG tablet TAKE 1 TABLET BY MOUTH ONCE A DAY AT BEDTIME FOR CHOLESTEROL (Patient taking differently: Take 10 mg by mouth at bedtime.)    [DISCONTINUED] fluconazole (DIFLUCAN) 100 MG tablet Take 1 tablet (100 mg total) by mouth daily for 4 days, then as needed (Patient not taking: Reported on 07/05/2021)    [DISCONTINUED] omeprazole (PRILOSEC) 20 MG capsule Take 20 mg by mouth daily. (Patient not taking: Reported on 07/05/2021)    No facility-administered encounter  medications on file as of 07/05/2021.     Past Medical History:  Diagnosis Date   Acid reflux    Anemia    Arthritis    "maybe in my fingers" (03/26/2018)   Breast cancer ([facility]) 12/2020   both sides   Concussion 2001   no deficit had tia right after no problems since   Family history of adverse reaction to anesthesia    "brother, Timmy, and my mom always get sick" (03/26/2018)   Family history of brain cancer    Family history of breast cancer    Family history of colon cancer    Family history of lung cancer    Family history of pancreatic cancer    Family history of prostate cancer    Headache    High blood pressure    High cholesterol    Hip pain    "left; before OR" (03/26/2018)   History of chemotherapy last done 02-17-2021   History of hiatal hernia    History of kidney stones    OSA (obstructive sleep apnea)    "can't tolerate the mask" (03/26/2018)    PONV (postoperative nausea and vomiting)    does not need much anesthesia, slow to awlaekn in past   Pre-diabetes    Shortness of breath    with exertion   Wears partial dentures upper    Past Surgical History:  Procedure Laterality Date   CARPAL TUNNEL RELEASE Right ~ 2006   JOINT REPLACEMENT     LAPAROSCOPIC CHOLECYSTECTOMY  12/2010   LASIK Bilateral 2000   PORT A CATH REVISION Left 02/21/2021   Procedure: PORT A CATH REVISION;  Surgeon: Stark Klein, MD;  Location: [facility];  Service: General;  Laterality: Left;  60 MINUTES ROOM 5   PORTACATH PLACEMENT N/A 02/08/2021   Procedure: INSERTION PORT-A-CATH;  Surgeon: Stark Klein, MD;  Location: [facility];  Service: General;  Laterality: N/A;   TOTAL HIP ARTHROPLASTY Left 03/25/2018   Procedure: LEFT TOTAL HIP ARTHROPLASTY ANTERIOR APPROACH;  Surgeon: Mcarthur Rossetti, MD;  Location: [facility];  Service: Orthopedics;  Laterality: Left;    Family History  Problem Relation Age of Onset   Diabetes Mother    Thyroid disease Mother    Liver disease  Mother    Obesity Mother    Hypertension Father    Hyperlipidemia Father    Heart disease Father    Stroke Father    Obesity Father    Colon polyps Father    Prostate cancer Brother    Brain cancer Maternal Grandfather    Pancreatic cancer Paternal Grandmother    Colon cancer Neg Hx        No one in family with colon cancer.    Social History   Social History Narrative   Not on file     Review of  Systems General: Denies fevers, chills, weight loss CV: Denies chest pain, shortness of breath, palpitations  Physical Exam Vitals with BMI 07/05/2021 06/17/2021 06/15/2021  Height 5' 7"  - -  Weight 271 lbs - -  BMI 67.73 - -  Systolic 736 681 -  Diastolic 69 69 -  Pulse 594 76 98    General:  No acute distress,  Alert and oriented, Non-Toxic, Normal speech and affect Breast: Mild ptosis and reasonable symmetry.  No obvious scars.  Assessment/Plan Patient presents to discuss reconstructive options for upcoming surgical treatment of her bilateral breast cancers.  I had a long talk with her about autologous and implant-based reconstruction.  I focused my discussion on implant-based reconstruction in accordance with her direction.  We discussed that this would be a staged process with a tissue expander followed by an implant.  We discussed the possible complications and risks.  She is not interested in implant or autologous based reconstruction at this time.  I also discussed with her the possibility of doing an aesthetic flat closure where the inferior mastectomy flap was de-epithelialized and tucked under the superior flap to give some degree of contour.  She did not seem particularly interested in that either at this point.  She would prefer to focus on the cancer treatment and what is a hard situation with difficult cancers on both sides.  I will plan to be available should she want to discuss this any further.  We will plan to see her again on an as-needed basis.  Cindra Presume 07/05/2021, 9:38 PM

## 2021-07-06 ENCOUNTER — Other Ambulatory Visit: Payer: 59

## 2021-07-06 ENCOUNTER — Encounter: Payer: Self-pay | Admitting: *Deleted

## 2021-07-06 ENCOUNTER — Ambulatory Visit: Payer: 59

## 2021-07-06 ENCOUNTER — Other Ambulatory Visit: Payer: Self-pay | Admitting: General Surgery

## 2021-07-06 ENCOUNTER — Ambulatory Visit: Payer: 59 | Admitting: Adult Health

## 2021-07-06 DIAGNOSIS — Z170 Estrogen receptor positive status [ER+]: Secondary | ICD-10-CM

## 2021-07-06 DIAGNOSIS — C50511 Malignant neoplasm of lower-outer quadrant of right female breast: Secondary | ICD-10-CM

## 2021-07-07 ENCOUNTER — Encounter: Payer: Self-pay | Admitting: *Deleted

## 2021-07-08 ENCOUNTER — Ambulatory Visit: Payer: 59

## 2021-07-11 ENCOUNTER — Other Ambulatory Visit: Payer: Self-pay | Admitting: Family Medicine

## 2021-07-11 DIAGNOSIS — Z780 Asymptomatic menopausal state: Secondary | ICD-10-CM

## 2021-07-11 DIAGNOSIS — E289 Ovarian dysfunction, unspecified: Secondary | ICD-10-CM

## 2021-07-13 ENCOUNTER — Other Ambulatory Visit: Payer: Self-pay | Admitting: Family Medicine

## 2021-07-13 ENCOUNTER — Other Ambulatory Visit: Payer: 59

## 2021-07-13 ENCOUNTER — Inpatient Hospital Stay: Payer: 59 | Attending: Oncology | Admitting: Oncology

## 2021-07-13 ENCOUNTER — Encounter: Payer: Self-pay | Admitting: Oncology

## 2021-07-13 DIAGNOSIS — C50812 Malignant neoplasm of overlapping sites of left female breast: Secondary | ICD-10-CM | POA: Insufficient documentation

## 2021-07-13 DIAGNOSIS — E2839 Other primary ovarian failure: Secondary | ICD-10-CM

## 2021-07-13 DIAGNOSIS — Z5189 Encounter for other specified aftercare: Secondary | ICD-10-CM | POA: Insufficient documentation

## 2021-07-13 DIAGNOSIS — Z780 Asymptomatic menopausal state: Secondary | ICD-10-CM

## 2021-07-13 DIAGNOSIS — Z170 Estrogen receptor positive status [ER+]: Secondary | ICD-10-CM | POA: Insufficient documentation

## 2021-07-13 DIAGNOSIS — Z171 Estrogen receptor negative status [ER-]: Secondary | ICD-10-CM | POA: Insufficient documentation

## 2021-07-13 DIAGNOSIS — Z5112 Encounter for antineoplastic immunotherapy: Secondary | ICD-10-CM | POA: Insufficient documentation

## 2021-07-13 DIAGNOSIS — C50811 Malignant neoplasm of overlapping sites of right female breast: Secondary | ICD-10-CM | POA: Insufficient documentation

## 2021-07-13 DIAGNOSIS — Z79899 Other long term (current) drug therapy: Secondary | ICD-10-CM | POA: Insufficient documentation

## 2021-07-13 NOTE — Progress Notes (Signed)
Leslie Wright did not show for her 07/13/2021 visit.  She is being scheduled for surgery 08/08/2021.  She needs to continue on Keytruda and I have entered those orders.

## 2021-07-14 ENCOUNTER — Telehealth: Payer: Self-pay | Admitting: Oncology

## 2021-07-14 NOTE — Telephone Encounter (Signed)
Scheduled appointment per 10/13 los. Patient is aware. 

## 2021-07-25 ENCOUNTER — Encounter: Payer: Self-pay | Admitting: Hematology and Oncology

## 2021-07-25 ENCOUNTER — Encounter: Payer: Self-pay | Admitting: Oncology

## 2021-07-25 NOTE — Telephone Encounter (Signed)
No entry 

## 2021-07-26 DIAGNOSIS — J9601 Acute respiratory failure with hypoxia: Secondary | ICD-10-CM | POA: Diagnosis not present

## 2021-07-28 ENCOUNTER — Inpatient Hospital Stay: Payer: 59

## 2021-07-28 ENCOUNTER — Other Ambulatory Visit: Payer: Self-pay

## 2021-07-28 VITALS — BP 114/77 | HR 65 | Temp 98.7°F | Resp 18

## 2021-07-28 DIAGNOSIS — C50811 Malignant neoplasm of overlapping sites of right female breast: Secondary | ICD-10-CM | POA: Diagnosis not present

## 2021-07-28 DIAGNOSIS — C50812 Malignant neoplasm of overlapping sites of left female breast: Secondary | ICD-10-CM | POA: Diagnosis not present

## 2021-07-28 DIAGNOSIS — Z5189 Encounter for other specified aftercare: Secondary | ICD-10-CM | POA: Diagnosis not present

## 2021-07-28 DIAGNOSIS — Z95828 Presence of other vascular implants and grafts: Secondary | ICD-10-CM

## 2021-07-28 DIAGNOSIS — Z170 Estrogen receptor positive status [ER+]: Secondary | ICD-10-CM | POA: Diagnosis not present

## 2021-07-28 DIAGNOSIS — Z5112 Encounter for antineoplastic immunotherapy: Secondary | ICD-10-CM | POA: Diagnosis not present

## 2021-07-28 DIAGNOSIS — M539 Dorsopathy, unspecified: Secondary | ICD-10-CM

## 2021-07-28 DIAGNOSIS — Z171 Estrogen receptor negative status [ER-]: Secondary | ICD-10-CM | POA: Diagnosis not present

## 2021-07-28 DIAGNOSIS — L739 Follicular disorder, unspecified: Secondary | ICD-10-CM

## 2021-07-28 DIAGNOSIS — Z79899 Other long term (current) drug therapy: Secondary | ICD-10-CM | POA: Diagnosis not present

## 2021-07-28 DIAGNOSIS — C50511 Malignant neoplasm of lower-outer quadrant of right female breast: Secondary | ICD-10-CM

## 2021-07-28 DIAGNOSIS — K760 Fatty (change of) liver, not elsewhere classified: Secondary | ICD-10-CM

## 2021-07-28 LAB — COMPREHENSIVE METABOLIC PANEL
ALT: 8 U/L (ref 0–44)
AST: 13 U/L — ABNORMAL LOW (ref 15–41)
Albumin: 3.6 g/dL (ref 3.5–5.0)
Alkaline Phosphatase: 59 U/L (ref 38–126)
Anion gap: 12 (ref 5–15)
BUN: 10 mg/dL (ref 6–20)
CO2: 25 mmol/L (ref 22–32)
Calcium: 9.5 mg/dL (ref 8.9–10.3)
Chloride: 106 mmol/L (ref 98–111)
Creatinine, Ser: 1.17 mg/dL — ABNORMAL HIGH (ref 0.44–1.00)
GFR, Estimated: 55 mL/min — ABNORMAL LOW (ref >60)
Glucose, Bld: 103 mg/dL — ABNORMAL HIGH (ref 70–99)
Potassium: 3.3 mmol/L — ABNORMAL LOW (ref 3.5–5.1)
Sodium: 143 mmol/L (ref 135–145)
Total Bilirubin: 0.4 mg/dL (ref 0.3–1.2)
Total Protein: 7.1 g/dL (ref 6.5–8.1)

## 2021-07-28 LAB — CBC WITH DIFFERENTIAL/PLATELET
Abs Immature Granulocytes: 0.03 10*3/uL (ref 0.00–0.07)
Basophils Absolute: 0 10*3/uL (ref 0.0–0.1)
Basophils Relative: 0 %
Eosinophils Absolute: 0.6 10*3/uL — ABNORMAL HIGH (ref 0.0–0.5)
Eosinophils Relative: 7 %
HCT: 28.9 % — ABNORMAL LOW (ref 36.0–46.0)
Hemoglobin: 9.6 g/dL — ABNORMAL LOW (ref 12.0–15.0)
Immature Granulocytes: 0 %
Lymphocytes Relative: 10 %
Lymphs Abs: 0.8 10*3/uL (ref 0.7–4.0)
MCH: 30.9 pg (ref 26.0–34.0)
MCHC: 33.2 g/dL (ref 30.0–36.0)
MCV: 92.9 fL (ref 80.0–100.0)
Monocytes Absolute: 0.7 10*3/uL (ref 0.1–1.0)
Monocytes Relative: 8 %
Neutro Abs: 6.1 10*3/uL (ref 1.7–7.7)
Neutrophils Relative %: 75 %
Platelets: 175 10*3/uL (ref 150–400)
RBC: 3.11 MIL/uL — ABNORMAL LOW (ref 3.87–5.11)
RDW: 14.5 % (ref 11.5–15.5)
WBC: 8.3 10*3/uL (ref 4.0–10.5)
nRBC: 0 % (ref 0.0–0.2)

## 2021-07-28 LAB — T4, FREE: Free T4: 0.84 ng/dL (ref 0.61–1.12)

## 2021-07-28 LAB — TSH: TSH: 1.069 u[IU]/mL (ref 0.308–3.960)

## 2021-07-28 MED ORDER — SODIUM CHLORIDE 0.9 % IV SOLN
200.0000 mg | Freq: Once | INTRAVENOUS | Status: AC
  Administered 2021-07-28: 200 mg via INTRAVENOUS
  Filled 2021-07-28: qty 8

## 2021-07-28 MED ORDER — SODIUM CHLORIDE 0.9% FLUSH
10.0000 mL | Freq: Once | INTRAVENOUS | Status: AC
  Administered 2021-07-28: 10 mL

## 2021-07-28 MED ORDER — SODIUM CHLORIDE 0.9% FLUSH
10.0000 mL | INTRAVENOUS | Status: DC | PRN
Start: 2021-07-28 — End: 2021-07-28
  Administered 2021-07-28: 10 mL

## 2021-07-28 MED ORDER — SODIUM CHLORIDE 0.9 % IV SOLN: Freq: Once | INTRAVENOUS | Status: AC

## 2021-07-28 MED ORDER — HEPARIN SOD (PORK) LOCK FLUSH 100 UNIT/ML IV SOLN
500.0000 [IU] | Freq: Once | INTRAVENOUS | Status: AC | PRN
  Administered 2021-07-28: 500 [IU]

## 2021-07-28 MED ORDER — ALTEPLASE 2 MG IJ SOLR
2.0000 mg | Freq: Once | INTRAMUSCULAR | Status: AC
  Administered 2021-07-28: 2 mg
  Filled 2021-07-28: qty 2

## 2021-07-28 NOTE — Patient Instructions (Signed)
[facility]   Discharge Instructions: Thank you for choosing [facility] to provide your oncology and hematology care.   If you have a lab appointment with the [facility], please go directly to the [facility] and check in at the registration area.   Wear comfortable clothing and clothing appropriate for easy access to any Portacath or PICC line.   We strive to give you quality time with your provider. You may need to reschedule your appointment if you arrive late (15 or more minutes).  Arriving late affects you and other patients whose appointments are after yours.  Also, if you miss three or more appointments without notifying the office, you may be dismissed from the clinic at the provider's discretion.      For prescription refill requests, have your pharmacy contact our office and allow 72 hours for refills to be completed.    Today you received the following chemotherapy and/or immunotherapy agents: pembrolizumab.      To help prevent nausea and vomiting after your treatment, we encourage you to take your nausea medication as directed.  BELOW ARE SYMPTOMS THAT SHOULD BE REPORTED IMMEDIATELY: *FEVER GREATER THAN 100.4 F (38 C) OR HIGHER *CHILLS OR SWEATING *NAUSEA AND VOMITING THAT IS NOT CONTROLLED WITH YOUR NAUSEA MEDICATION *UNUSUAL SHORTNESS OF BREATH *UNUSUAL BRUISING OR BLEEDING *URINARY PROBLEMS (pain or burning when urinating, or frequent urination) *BOWEL PROBLEMS (unusual diarrhea, constipation, pain near the anus) TENDERNESS IN MOUTH AND THROAT WITH OR WITHOUT PRESENCE OF ULCERS (sore throat, sores in mouth, or a toothache) UNUSUAL RASH, SWELLING OR PAIN  UNUSUAL VAGINAL DISCHARGE OR ITCHING   Items with * indicate a potential emergency and should be followed up as soon as possible or go to the Emergency Department if any problems should occur.  Please show the CHEMOTHERAPY ALERT CARD or IMMUNOTHERAPY ALERT CARD at  check-in to the Emergency Department and triage nurse.  Should you have questions after your visit or need to cancel or reschedule your appointment, please contact [facility]  Dept: [phone]  and follow the prompts.  Office hours are 8:00 a.m. to 4:30 p.m. Monday - Friday. Please note that voicemails left after 4:00 p.m. may not be returned until the following business day.  We are closed weekends and major holidays. You have access to a nurse at all times for urgent questions. Please call the main number to the clinic Dept: [phone] and follow the prompts.   For any non-urgent questions, you may also contact your provider using MyChart. We now offer e-Visits for anyone 45 and older to request care online for non-urgent symptoms. For details visit [email or web address].   Also download the MyChart app! Go to the app store, search "MyChart", open the app, select [facility], and log in with your MyChart username and password.  Due to Covid, a mask is required upon entering the hospital/clinic. If you do not have a mask, one will be given to you upon arrival. For doctor visits, patients may have 1 support person aged 79 or older with them. For treatment visits, patients cannot have anyone with them due to current Covid guidelines and our immunocompromised population.   Pembrolizumab injection What is this medication? PEMBROLIZUMAB (pem broe liz ue mab) is a monoclonal antibody. It is used to treat certain types of cancer. This medicine may be used for other purposes; ask your health care provider or pharmacist if you have questions. COMMON BRAND NAME(S): [employer]  What should I tell my care team before I take this medication? They need to know if you have any of these conditions: autoimmune diseases like Crohn's disease, ulcerative colitis, or lupus have had or planning to have an allogeneic stem cell transplant (uses someone else's stem cells) history of  organ transplant history of chest radiation nervous system problems like myasthenia gravis or Guillain-Barre syndrome an unusual or allergic reaction to pembrolizumab, other medicines, foods, dyes, or preservatives pregnant or trying to get pregnant breast-feeding How should I use this medication? This medicine is for infusion into a vein. It is given by a health care professional in a hospital or clinic setting. A special MedGuide will be given to you before each treatment. Be sure to read this information carefully each time. Talk to your pediatrician regarding the use of this medicine in children. While this drug may be prescribed for children as young as 6 months for selected conditions, precautions do apply. Overdosage: If you think you have taken too much of this medicine contact a poison control center or emergency room at once. NOTE: This medicine is only for you. Do not share this medicine with others. What if I miss a dose? It is important not to miss your dose. Call your doctor or health care professional if you are unable to keep an appointment. What may interact with this medication? Interactions have not been studied. This list may not describe all possible interactions. Give your health care provider a list of all the medicines, herbs, non-prescription drugs, or dietary supplements you use. Also tell them if you smoke, drink alcohol, or use illegal drugs. Some items may interact with your medicine. What should I watch for while using this medication? Your condition will be monitored carefully while you are receiving this medicine. You may need blood work done while you are taking this medicine. Do not become pregnant while taking this medicine or for 4 months after stopping it. Women should inform their doctor if they wish to become pregnant or think they might be pregnant. There is a potential for serious side effects to an unborn child. Talk to your health care professional or  pharmacist for more information. Do not breast-feed an infant while taking this medicine or for 4 months after the last dose. What side effects may I notice from receiving this medication? Side effects that you should report to your doctor or health care professional as soon as possible: allergic reactions like skin rash, itching or hives, swelling of the face, lips, or tongue bloody or black, tarry breathing problems changes in vision chest pain chills confusion constipation cough diarrhea dizziness or feeling faint or lightheaded fast or irregular heartbeat fever flushing joint pain low blood counts - this medicine may decrease the number of white blood cells, red blood cells and platelets. You may be at increased risk for infections and bleeding. muscle pain muscle weakness pain, tingling, numbness in the hands or feet persistent headache redness, blistering, peeling or loosening of the skin, including inside the mouth signs and symptoms of high blood sugar such as dizziness; dry mouth; dry skin; fruity breath; nausea; stomach pain; increased hunger or thirst; increased urination signs and symptoms of kidney injury like trouble passing urine or change in the amount of urine signs and symptoms of liver injury like dark urine, light-colored stools, loss of appetite, nausea, right upper belly pain, yellowing of the eyes or skin sweating swollen lymph nodes weight loss Side effects that usually do not require  medical attention (report to your doctor or health care professional if they continue or are bothersome): decreased appetite hair loss tiredness This list may not describe all possible side effects. Call your doctor for medical advice about side effects. You may report side effects to FDA at 1-800-FDA-1088. Where should I keep my medication? This drug is given in a hospital or clinic and will not be stored at home. NOTE: This sheet is a summary. It may not cover all possible  information. If you have questions about this medicine, talk to your doctor, pharmacist, or health care provider.  2022 Elsevier/Gold Standard (2019-08-19 21:44:53)

## 2021-08-03 ENCOUNTER — Telehealth: Payer: Self-pay

## 2021-08-03 NOTE — Telephone Encounter (Signed)
Called pt to review potassium level from recent blood work.  Instructed pt to include potassium rich foods in her diet and also reviewed which foods this would include.  Pt verbalized understanding.

## 2021-08-03 NOTE — Pre-Procedure Instructions (Signed)
Surgical Instructions    Your procedure is scheduled on Tuesday, August 08, 2021 at 9:00 AM.  Report to [facility] Main Entrance "A" at 7:00 A.M., then check in with the Admitting office.  Call this number if you have problems the morning of surgery:  [phone]   If you have any questions prior to your surgery date call [phone]: Open Monday-Friday 8am-4pm    Remember:  Do not eat after midnight the night before your surgery  You may drink clear liquids until 6:00 AM the morning of your surgery.   Clear liquids allowed are: Water, Non-Citrus Juices (without pulp), Carbonated Beverages, Clear Tea, Black Coffee Only, and Gatorade    Take these medicines the morning of surgery with A SIP OF WATER:  azelastine (ASTELIN) 0.1 % nasal spray  IF NEEDED: acetaminophen (TYLENOL) benzonatate (TESSALON) ondansetron (ZOFRAN)  Follow your surgeon's instructions on when to stop Aspirin.  If no instructions were given by your surgeon then you will need to call the office to get those instructions.     As of today, STOP taking any Aleve, Naproxen, Ibuprofen, Motrin, Advil, Goody's, BC's, all herbal medications, fish oil, and all vitamins.                     Do NOT Smoke (Tobacco/Vaping) or drink Alcohol 24 hours prior to your procedure.  If you use a CPAP at night, you may bring all equipment for your overnight stay.   Contacts, glasses, piercing's, hearing aid's, dentures or partials may not be worn into surgery, please bring cases for these belongings.    For patients admitted to the hospital, discharge time will be determined by your treatment team.   Patients discharged the day of surgery will not be allowed to drive home, and someone needs to stay with them for 24 hours.  NO VISITORS WILL BE ALLOWED IN PRE-OP WHERE PATIENTS GET READY FOR SURGERY.  ONLY 1 SUPPORT PERSON MAY BE PRESENT IN THE WAITING ROOM WHILE YOU ARE IN SURGERY.  IF YOU ARE TO BE ADMITTED, ONCE YOU ARE IN YOUR  ROOM YOU WILL BE ALLOWED TWO (2) VISITORS.  Minor children may have two parents present. Special consideration for safety and communication needs will be reviewed on a case by case basis.   Special instructions:   [facility]- Preparing For Surgery  Before surgery, you can play an important role. Because skin is not sterile, your skin needs to be as free of germs as possible. You can reduce the number of germs on your skin by washing with CHG (chlorahexidine gluconate) Soap before surgery.  CHG is an antiseptic cleaner which kills germs and bonds with the skin to continue killing germs even after washing.    Oral Hygiene is also important to reduce your risk of infection.  Remember - BRUSH YOUR TEETH THE MORNING OF SURGERY WITH YOUR REGULAR TOOTHPASTE  Please do not use if you have an allergy to CHG or antibacterial soaps. If your skin becomes reddened/irritated stop using the CHG.  Do not shave (including legs and underarms) for at least 48 hours prior to first CHG shower. It is OK to shave your face.  Please follow these instructions carefully.   Shower the NIGHT BEFORE SURGERY and the MORNING OF SURGERY  If you chose to wash your hair, wash your hair first as usual with your normal shampoo.  After you shampoo, rinse your hair and body thoroughly to remove the shampoo.  Use CHG Soap  as you would any other liquid soap. You can apply CHG directly to the skin and wash gently with a scrungie or a clean washcloth.   Apply the CHG Soap to your body ONLY FROM THE NECK DOWN.  Do not use on open wounds or open sores. Avoid contact with your eyes, ears, mouth and genitals (private parts). Wash Face and genitals (private parts)  with your normal soap.   Wash thoroughly, paying special attention to the area where your surgery will be performed.  Thoroughly rinse your body with warm water from the neck down.  DO NOT shower/wash with your normal soap after using and rinsing off the CHG Soap.  Pat  yourself dry with a CLEAN TOWEL.  Wear CLEAN PAJAMAS to bed the night before surgery  Place CLEAN SHEETS on your bed the night before your surgery  DO NOT SLEEP WITH PETS.   Day of Surgery: Shower with CHG soap. Do not wear jewelry, make up, nail polish, gel polish, artificial nails, or any other type of covering on natural nails including finger and toenails. If patients have artificial nails, gel coating, etc. that need to be removed by a nail salon please have this removed prior to surgery. Surgery may need to be canceled/delayed if the surgeon/ anesthesia feels like the patient is unable to be adequately monitored. Do not wear lotions, powders, perfumes, or deodorant. Do not shave 48 hours prior to surgery. Do not bring valuables to the hospital. [facility] is not responsible for any belongings or valuables. Wear Clean/Comfortable clothing the morning of surgery Remember to brush your teeth WITH YOUR REGULAR TOOTHPASTE.   Please read over the following fact sheets that you were given.   3 days prior to your procedure or After your COVID test   You are not required to quarantine however you are required to wear a well-fitting mask when you are out and around people not in your household. If your mask becomes wet or soiled, replace with a new one.   Wash your hands often with soap and water for 20 seconds or clean your hands with an alcohol-based hand sanitizer that contains at least 60% alcohol.   Do not share personal items.   Notify your provider:  o if you are in close contact with someone who has COVID  o or if you develop a fever of 100.4 or greater, sneezing, cough, sore throat, shortness of breath or body aches.

## 2021-08-04 ENCOUNTER — Encounter (HOSPITAL_COMMUNITY)
Admission: RE | Admit: 2021-08-04 | Discharge: 2021-08-04 | Disposition: A | Discharge status: Home / Self Care | Payer: 59 | Source: Ambulatory Visit | Attending: General Surgery | Admitting: General Surgery

## 2021-08-04 ENCOUNTER — Encounter (HOSPITAL_COMMUNITY): Payer: Self-pay

## 2021-08-04 ENCOUNTER — Other Ambulatory Visit: Payer: Self-pay

## 2021-08-04 VITALS — BP 120/78 | HR 103 | Temp 98.1°F | Resp 19

## 2021-08-04 DIAGNOSIS — Z20822 Contact with and (suspected) exposure to covid-19: Secondary | ICD-10-CM | POA: Diagnosis not present

## 2021-08-04 DIAGNOSIS — Z01812 Encounter for preprocedural laboratory examination: Secondary | ICD-10-CM | POA: Diagnosis not present

## 2021-08-04 DIAGNOSIS — I10 Essential (primary) hypertension: Secondary | ICD-10-CM | POA: Diagnosis not present

## 2021-08-04 DIAGNOSIS — R7303 Prediabetes: Secondary | ICD-10-CM | POA: Insufficient documentation

## 2021-08-04 DIAGNOSIS — Z01818 Encounter for other preprocedural examination: Secondary | ICD-10-CM

## 2021-08-04 HISTORY — DX: Transient cerebral ischemic attack, unspecified: G45.9

## 2021-08-04 LAB — SARS CORONAVIRUS 2 (TAT 6-24 HRS): SARS Coronavirus 2: NEGATIVE

## 2021-08-04 LAB — CBC
HCT: 31.9 % — ABNORMAL LOW (ref 36.0–46.0)
Hemoglobin: 9.9 g/dL — ABNORMAL LOW (ref 12.0–15.0)
MCH: 29.9 pg (ref 26.0–34.0)
MCHC: 31 g/dL (ref 30.0–36.0)
MCV: 96.4 fL (ref 80.0–100.0)
Platelets: 164 10*3/uL (ref 150–400)
RBC: 3.31 MIL/uL — ABNORMAL LOW (ref 3.87–5.11)
RDW: 14.2 % (ref 11.5–15.5)
WBC: 8.5 10*3/uL (ref 4.0–10.5)
nRBC: 0 % (ref 0.0–0.2)

## 2021-08-04 LAB — BASIC METABOLIC PANEL
Anion gap: 10 (ref 5–15)
BUN: 9 mg/dL (ref 6–20)
CO2: 27 mmol/L (ref 22–32)
Calcium: 9.4 mg/dL (ref 8.9–10.3)
Chloride: 105 mmol/L (ref 98–111)
Creatinine, Ser: 1.17 mg/dL — ABNORMAL HIGH (ref 0.44–1.00)
GFR, Estimated: 55 mL/min — ABNORMAL LOW (ref >60)
Glucose, Bld: 132 mg/dL — ABNORMAL HIGH (ref 70–99)
Potassium: 3 mmol/L — ABNORMAL LOW (ref 3.5–5.1)
Sodium: 142 mmol/L (ref 135–145)

## 2021-08-04 LAB — GLUCOSE, CAPILLARY: Glucose-Capillary: 130 mg/dL — ABNORMAL HIGH (ref 70–99)

## 2021-08-04 LAB — HEMOGLOBIN A1C
Hgb A1c MFr Bld: 4.5 % — ABNORMAL LOW (ref 4.8–5.6)
Mean Plasma Glucose: 82.45 mg/dL

## 2021-08-04 NOTE — Progress Notes (Signed)
PCP - Dr. Rachell Cipro Cardiologist - Denies Oncologist: Dr. Lurline Del  PPM/ICD - Denies  Chest x-ray - N/A EKG - 02/07/21 Stress Test - Denies ECHO - 02/07/21 Cardiac Cath - Denies  Sleep Study - Yes CPAP - No  Patient states she is pre-diabetic, but does not take any meds and does not check her blood sugars.  Blood Thinner Instructions: N/A Aspirin Instructions: Instructed to call surgeons office for instructions on when to stop.  ERAS Protcol - Yes PRE-SURGERY Ensure or G2-  No  COVID TEST- 08/04/21 Done in PAT   Anesthesia review: Yes, previous EKG abnormal  Patient denies shortness of breath, fever, cough and chest pain at PAT appointment   All instructions explained to the patient, with a verbal understanding of the material. Patient agrees to go over the instructions while at home for a better understanding. Patient also instructed to self quarantine after being tested for COVID-19. The opportunity to ask questions was provided.

## 2021-08-07 ENCOUNTER — Ambulatory Visit
Admission: RE | Admit: 2021-08-07 | Discharge: 2021-08-07 | Disposition: A | Discharge status: Home / Self Care | Payer: 59 | Source: Ambulatory Visit | Attending: General Surgery | Admitting: General Surgery

## 2021-08-07 ENCOUNTER — Other Ambulatory Visit: Payer: Self-pay

## 2021-08-07 ENCOUNTER — Other Ambulatory Visit: Payer: Self-pay | Admitting: General Surgery

## 2021-08-07 DIAGNOSIS — R590 Localized enlarged lymph nodes: Secondary | ICD-10-CM | POA: Diagnosis not present

## 2021-08-07 DIAGNOSIS — C50511 Malignant neoplasm of lower-outer quadrant of right female breast: Secondary | ICD-10-CM

## 2021-08-07 DIAGNOSIS — Z170 Estrogen receptor positive status [ER+]: Secondary | ICD-10-CM

## 2021-08-07 NOTE — H&P (Signed)
REFERRING PHYSICIAN: Dr. Theda Sers  PROVIDER: Georgianne Fick, MD  Care Team: Patient Care Team: Fanny Bien, MD as PCP - General (Family Medicine) Georgianne Fick, MD as Consulting Provider (Surgical Oncology) Magrinat, Virgie Dad, MD (Hematology and Oncology) Marye Round, MD (Radiation Oncology)   MRN: [id number] DOB: 22-Feb-1966  Subjective   Chief Complaint: Breast Cancer   History of Present Illness: .  Leslie KitchenPt is a 55 yo F referred by Dr. Ammie Ferrier for consultation regarding a breast cancer dx 12/2020. The patient presented with a palpable left breast mass.  She then had dx mammogram and ultrasound.  She had multiple findings:  On the left: 5.8 cm mass at 12:30 1.2 cm Additional group of calcifications "outer right breast"  on the right: Multiple small masses and irregular ducts in the retroareolar right breast. A representative mass was 8 mm. 4 mm indeterminate mass upper inner breast  MR also showed additional findings including another spot in the upper right breast that was a CSL. 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   She hasn't had a personal cancer dx before this, but has family cancer history.  Her maternal aunt had breast cancer dx late 24s, maternal grandfather had a brain tumor, and brother had prostate cancer.  Her paternal grandfather had pancreatic cancer and a paternal uncle had melanoma.  She is nulliparous wtih menarche age 56 and menopause early 62s.    Initial staging CT chest and bone scan were negative  Interval history  She has received neoadjuvant chemotherapy and is ready for surgery. She tolerated chemo relatively well. Her last chemo was 9/15. The driving cancer in terms of prognosis is the larger tumor on the left that is essentially triple negative. She wants bilateral mastectomies. She has not yet seen Dr. Claudia Desanctis.   MR PRE chemo  02/02/21 IMPRESSION: 1. 5.8 cm spiculated mass in the upper-outer quadrant left breast correlating to recently biopsy proven left breast cancer.   2. 2.1 cm spiculated enhancing mass in the right breast 6 o'clock with associated biopsy clip consistent with recently proven right breast cancer.   3. There are extensive patchy nodular enhancement throughout the lower portions of bilateral breast. The left breast biopsy proven DCIS clip is located in one of the areas of this extensive patchy nodular enhancement. Given the MRI enhancement in the inferior portions of bilateral breast, additional site of cancer is not excluded in the inferior portions of bilateral breasts. If breast conservation is being consider for either breast, the patient will need MR guided biopsies of bilateral lower breasts, 2 sites in the each breast of the anterior and posterior extent.   4. Recommend stereotactic core biopsy of indeterminate small mass in the upper inner quadrant right breast seen and described on the diagnostic mammogram of January 12, 2021.   5. Recommend second-look ultrasound with possible biopsy of one abnormal eccentric thickened cortex right axillary lymph node.   RECOMMENDATION: 1. recommend stereotactic core biopsy of indeterminate small mass in the upper inner quadrant right breast seen and described on the diagnostic mammogram of January 12, 2021.   2. Recommend second-look ultrasound with possible biopsy of one abnormal eccentric thickened cortex right axillary lymph node.   3. There are extensive patchy nodular enhancement throughout the lower portions of bilateral breast. The left breast biopsy proven DCIS clip is located in one of the areas of this extensive patchy  nodular enhancement. Given the MRI enhancement in the inferior portions of bilateral breast, additional site of cancer is not excluded in the inferior portions of bilateral breasts. If breast conservation is being  consider for either breast, the patient will need MR guided biopsies of bilateral lower breasts, 2 sites in the each breast of the anterior and posterior extent.   BI-RADS CATEGORY 4: Suspicious.    LEFT sided biopsies 01/20/21 1. 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 Breast, left, needle core biopsy, central, slightly lateral to midline - DUCTAL CARCINOMA IN SITU WITH NECROSIS AND CALCIFICATIONS the ductal carcinoma in situ has solid and cribriform patterns, intermediate nuclear grade Estrogen Receptor: >95%, POSITIVE, MODERATE STAINING INTENSITY Progesterone Receptor: 40%, POSITIVE, STRONG STAINING INTENSITY  RIGHT sided biopsies 01/16/21 1. Breast, right, needle core biopsy, 6 o'clock, 1cmfn - INVASIVE DUCTAL CARCINOMA. SEE NOTE - CALCIFICATIONS Estrogen Receptor: 90%, POSITIVE, STRONG STAINING INTENSITY Progesterone Receptor: 0%, NEGATIVE Proliferation Marker Ki67: 5% GROUP 5: HER2 **NEGATIVE** 2. Breast, right, needle core biopsy, 9 o'clock, 1cmfn - FIBROCYSTIC CHANGE WITH ADENOSIS, CALCIFICATIONS AND A SMALL INTRADUCTAL PAPILLOMA - NEGATIVE FOR CARCINOMA  RIGHT MR biopsy 02/07/21 Breast, right, needle core biopsy, UIQ posterior - COMPLEX SCLEROSING LESION  Review of Systems: A complete review of systems was obtained from the patient. I have reviewed this information and discussed as appropriate with the patient. See HPI as well for other ROS.  Review of Systems  HENT: Positive for congestion.  Eyes: Positive for blurred vision.  Respiratory: Positive for shortness of breath.  All other systems reviewed and are negative.   Medical History: Past Medical History:  Diagnosis Date   DVT (deep venous thrombosis) (CMS-HCC)    GERD (gastroesophageal reflux disease)   History of cancer   Hyperlipidemia   Sleep apnea   Patient Active Problem List  Diagnosis   Malignant neoplasm of overlapping sites of left breast in female, estrogen receptor negative (CMS-HCC)   Malignant neoplasm of lower-outer quadrant of right breast of female, estrogen receptor positive (CMS-HCC)   Past Surgical History:  Procedure Laterality Date   Gallbladder surgery   rt ctr surgery   THR-LT SURGERY    No Known Allergies  Current Outpatient Medications on File Prior to Visit  Medication Sig Dispense Refill   losartan (COZAAR) 100 MG tablet Take 1 tablet by mouth once daily   prochlorperazine (COMPAZINE) 10 MG tablet Take by mouth   acetaminophen (TYLENOL) 500 MG tablet Take by mouth   aspirin 81 MG EC tablet Take 81 mg by mouth once daily   cholecalciferol (VITAMIN D3) 2,000 unit capsule Take by mouth   multivitamin with minerals tablet Take 1 tablet by mouth once daily   omeprazole (PRILOSEC) 40 MG DR capsule   ondansetron (ZOFRAN) 8 MG tablet   No current facility-administered medications on file prior to visit.   Family History  Problem Relation Age of Onset   Obesity Mother   Diabetes Mother   Stroke Father   Skin cancer Father   Obesity Father   High blood pressure (Hypertension) Father   Hyperlipidemia (Elevated cholesterol) Father   Heart valve disease Father    Social History   Tobacco Use  Smoking Status Never Smoker  Smokeless Tobacco Never Used    Social History   Socioeconomic History  Marital status: Unknown  Tobacco Use   Smoking status: Never Smoker   Smokeless tobacco: Never Used  Vaping Use   Vaping Use: Never used  Substance and Sexual Activity   Alcohol use: Never   Drug use: Never   Objective:   Vitals:  BP: (!) 112/90  Pulse: 67  Temp: 36.3 C (97.3 F)  SpO2: 99%  Weight: (!) 121.7 kg (268 lb 6.4 oz)  Height: 170.2 cm (5' 7" )   Body mass index is 42.04 kg/m.  Gen: No  acute distress. Well nourished and well groomed.  Neurological: Alert and oriented to person, place, and time. Coordination normal.  Head: Normocephalic and atraumatic.  Eyes: Conjunctivae are normal. Pupils are equal, round, and reactive to light. No scleral icterus.  Neck: Normal range of motion. Neck supple. No tracheal deviation or thyromegaly present.  Cardiovascular: Normal rate, regular rhythm, normal heart sounds and intact distal pulses. Exam reveals no gallop and no friction rub. No murmur heard. Breast: palpable mass in the UOQ left breast is less distinct, but still around 3-4 cm. No palpable adenopathy. No nipple retraction. No visible breast skin lymphedema.  Respiratory: Effort normal. No respiratory distress. No chest wall tenderness. Breath sounds normal. No wheezes, rales or rhonchi.  GI: Soft. Bowel sounds are normal. The abdomen is soft and nontender. There is no rebound and no guarding.  Musculoskeletal: Normal range of motion. Extremities are nontender.  Lymphadenopathy: No cervical, preauricular, postauricular or axillary adenopathy is present Skin: Skin is warm and dry. No rash noted. No diaphoresis. No erythema. No pallor. No clubbing, cyanosis, or edema.  Psychiatric: Normal mood and affect. Behavior is normal. Judgment and thought content normal.   Labs/imaging Follow up MRI after chemo 06/26/21 IMPRESSION: 1. Positive imaging response to neoadjuvant chemotherapy. 2. The dominant malignancy in the upper outer left breast is unchanged in overall extent, but now has become patchy, appearing as closely approximated smaller peripherally enhancing masses and intervening fainter areas of non mass enhancement, also appearing more irregular and less coherent on T1 weighted imaging. The overall mass currently measures 5.2 x 3.2 x 3.6 cm, previously 5.8 x 4.5 cm in greatest transverse dimension. 3. There is now no abnormal enhancement associated with the known area of DCIS  in the lateral left breast, middle depth. 4. There is no abnormal enhancement associated with the invasive carcinoma in the 6 o'clock position of the anterior right breast. 5. There is no abnormal enhancement corresponding to the discordant biopsy at the 9 o'clock position of the right breast, where the biopsy revealed fibrosis, adenosis and a small intraductal papilloma. 6. There is no abnormal enhancement associated with the biopsy in the upper inner right breast, where pathology showed a complex sclerosing lesion. 7. There are no new masses or new areas of abnormal enhancement to suggest additional sites of malignancy.   RECOMMENDATION: 1. Treatment as planned for the known bilateral breast carcinoma.   BI-RADS CATEGORY 6: Known biopsy-proven malignancy.  Assessment and Plan:   Malignant neoplasm of overlapping sites of left breast in female, estrogen receptor negative (CMS-HCC) Will plan bilateral mastectomies. I have messaged Dr. Claudia Desanctis to expedite appt.   I reviewed risks of surgery including bleeding, infection, numbness, dissatisfaction with reconstruction, recurrent cancer, damage to adjacent structures, prolonged pain, blood clot, heart/lung complications, and more.   She will need adjuvant XRT on the left.   I discussed the actual procedure in terms of what it would entail. I reviewed overnight stay and drain placement.  She understands and wishes to proceed.   Malignant neoplasm of lower-outer quadrant of right breast of female, estrogen receptor positive (CMS-HCC) Pt also desired mastectomy on this side. She has cancer there and has two additional sites that need to be excised. Given the enhancement in the lower breast she desires mastectomy on this side as well.   She also had an abnormal node that couldn't get needle biopsy so she will need a seed placed there.   Milus Height, MD FACS Surgical Oncology, General Surgery, Trauma and [facility] [place]

## 2021-08-08 ENCOUNTER — Ambulatory Visit (HOSPITAL_COMMUNITY): Payer: 59 | Admitting: Physician Assistant

## 2021-08-08 ENCOUNTER — Encounter (HOSPITAL_COMMUNITY): Payer: Self-pay | Admitting: General Surgery

## 2021-08-08 ENCOUNTER — Ambulatory Visit (HOSPITAL_COMMUNITY): Payer: 59 | Admitting: Anesthesiology

## 2021-08-08 ENCOUNTER — Other Ambulatory Visit: Payer: Self-pay | Admitting: General Surgery

## 2021-08-08 ENCOUNTER — Encounter (HOSPITAL_COMMUNITY)
Admission: RE | Disposition: A | Discharge status: Home / Self Care | Payer: Self-pay | Source: Home / Self Care | Attending: General Surgery

## 2021-08-08 ENCOUNTER — Ambulatory Visit (HOSPITAL_COMMUNITY)
Admission: RE | Admit: 2021-08-08 | Discharge: 2021-08-09 | Disposition: A | Discharge status: Home / Self Care | Payer: 59 | Attending: General Surgery | Admitting: General Surgery

## 2021-08-08 ENCOUNTER — Ambulatory Visit
Admission: RE | Admit: 2021-08-08 | Discharge: 2021-08-08 | Disposition: A | Discharge status: Home / Self Care | Payer: 59 | Source: Ambulatory Visit | Attending: General Surgery | Admitting: General Surgery

## 2021-08-08 ENCOUNTER — Other Ambulatory Visit: Payer: Self-pay

## 2021-08-08 DIAGNOSIS — K760 Fatty (change of) liver, not elsewhere classified: Secondary | ICD-10-CM | POA: Diagnosis not present

## 2021-08-08 DIAGNOSIS — C50912 Malignant neoplasm of unspecified site of left female breast: Secondary | ICD-10-CM | POA: Diagnosis present

## 2021-08-08 DIAGNOSIS — C50911 Malignant neoplasm of unspecified site of right female breast: Secondary | ICD-10-CM | POA: Diagnosis not present

## 2021-08-08 DIAGNOSIS — Z86718 Personal history of other venous thrombosis and embolism: Secondary | ICD-10-CM | POA: Diagnosis not present

## 2021-08-08 DIAGNOSIS — R921 Mammographic calcification found on diagnostic imaging of breast: Secondary | ICD-10-CM | POA: Diagnosis not present

## 2021-08-08 DIAGNOSIS — C50812 Malignant neoplasm of overlapping sites of left female breast: Secondary | ICD-10-CM | POA: Diagnosis not present

## 2021-08-08 DIAGNOSIS — Z87891 Personal history of nicotine dependence: Secondary | ICD-10-CM | POA: Diagnosis not present

## 2021-08-08 DIAGNOSIS — Z170 Estrogen receptor positive status [ER+]: Secondary | ICD-10-CM | POA: Insufficient documentation

## 2021-08-08 DIAGNOSIS — Z171 Estrogen receptor negative status [ER-]: Secondary | ICD-10-CM | POA: Diagnosis not present

## 2021-08-08 DIAGNOSIS — G4730 Sleep apnea, unspecified: Secondary | ICD-10-CM | POA: Diagnosis not present

## 2021-08-08 DIAGNOSIS — C50212 Malignant neoplasm of upper-inner quadrant of left female breast: Secondary | ICD-10-CM | POA: Diagnosis not present

## 2021-08-08 DIAGNOSIS — D241 Benign neoplasm of right breast: Secondary | ICD-10-CM | POA: Diagnosis not present

## 2021-08-08 DIAGNOSIS — R590 Localized enlarged lymph nodes: Secondary | ICD-10-CM | POA: Diagnosis not present

## 2021-08-08 DIAGNOSIS — Z803 Family history of malignant neoplasm of breast: Secondary | ICD-10-CM | POA: Diagnosis not present

## 2021-08-08 DIAGNOSIS — C50511 Malignant neoplasm of lower-outer quadrant of right female breast: Secondary | ICD-10-CM | POA: Diagnosis not present

## 2021-08-08 DIAGNOSIS — R7303 Prediabetes: Secondary | ICD-10-CM | POA: Insufficient documentation

## 2021-08-08 DIAGNOSIS — G8918 Other acute postprocedural pain: Secondary | ICD-10-CM | POA: Diagnosis not present

## 2021-08-08 DIAGNOSIS — N6021 Fibroadenosis of right breast: Secondary | ICD-10-CM | POA: Insufficient documentation

## 2021-08-08 DIAGNOSIS — I129 Hypertensive chronic kidney disease with stage 1 through stage 4 chronic kidney disease, or unspecified chronic kidney disease: Secondary | ICD-10-CM | POA: Diagnosis not present

## 2021-08-08 DIAGNOSIS — N183 Chronic kidney disease, stage 3 unspecified: Secondary | ICD-10-CM | POA: Insufficient documentation

## 2021-08-08 HISTORY — PX: TOTAL MASTECTOMY: SHX6129

## 2021-08-08 HISTORY — PX: SENTINEL NODE BIOPSY: SHX6608

## 2021-08-08 HISTORY — PX: RADIOACTIVE SEED GUIDED AXILLARY SENTINEL LYMPH NODE: SHX6735

## 2021-08-08 LAB — GLUCOSE, CAPILLARY: Glucose-Capillary: 121 mg/dL — ABNORMAL HIGH (ref 70–99)

## 2021-08-08 SURGERY — MASTECTOMY, SIMPLE
Anesthesia: Regional | Site: Breast | Laterality: Right

## 2021-08-08 MED ORDER — PROCHLORPERAZINE EDISYLATE 10 MG/2ML IJ SOLN: 5.0000 mg | Freq: Four times a day (QID) | INTRAMUSCULAR | Status: DC | PRN

## 2021-08-08 MED ORDER — PROPOFOL 1000 MG/100ML IV EMUL
INTRAVENOUS | Status: AC
  Filled 2021-08-08: qty 200

## 2021-08-08 MED ORDER — MAGTRACE LYMPHATIC TRACER
INTRAMUSCULAR | Status: DC | PRN
  Administered 2021-08-08 (×2): 2 mL via INTRAMUSCULAR

## 2021-08-08 MED ORDER — ROSUVASTATIN CALCIUM 5 MG PO TABS
10.0000 mg | ORAL_TABLET | Freq: Every day | ORAL | Status: DC
  Administered 2021-08-08: 10 mg via ORAL
  Filled 2021-08-08: qty 2

## 2021-08-08 MED ORDER — 0.9 % SODIUM CHLORIDE (POUR BTL) OPTIME
TOPICAL | Status: DC | PRN
  Administered 2021-08-08 (×2): 1000 mL

## 2021-08-08 MED ORDER — LIDOCAINE 2% (20 MG/ML) 5 ML SYRINGE
INTRAMUSCULAR | Status: DC | PRN
  Administered 2021-08-08: 60 mg via INTRAVENOUS

## 2021-08-08 MED ORDER — CEFAZOLIN SODIUM-DEXTROSE 2-4 GM/100ML-% IV SOLN
2.0000 g | Freq: Three times a day (TID) | INTRAVENOUS | Status: AC
  Administered 2021-08-08: 2 g via INTRAVENOUS
  Filled 2021-08-08: qty 100

## 2021-08-08 MED ORDER — LOSARTAN POTASSIUM 50 MG PO TABS: 50.0000 mg | ORAL_TABLET | Freq: Every day | ORAL | Status: DC

## 2021-08-08 MED ORDER — ONDANSETRON HCL 4 MG PO TABS: 8.0000 mg | ORAL_TABLET | Freq: Three times a day (TID) | ORAL | Status: DC | PRN

## 2021-08-08 MED ORDER — MIDAZOLAM HCL 2 MG/2ML IJ SOLN
INTRAMUSCULAR | Status: AC
  Administered 2021-08-08: 2 mg via INTRAVENOUS
  Filled 2021-08-08: qty 2

## 2021-08-08 MED ORDER — METHOCARBAMOL 500 MG PO TABS: 500.0000 mg | ORAL_TABLET | Freq: Four times a day (QID) | ORAL | Status: DC | PRN

## 2021-08-08 MED ORDER — GABAPENTIN 300 MG PO CAPS
300.0000 mg | ORAL_CAPSULE | ORAL | Status: AC
  Administered 2021-08-08: 300 mg via ORAL
  Filled 2021-08-08: qty 1

## 2021-08-08 MED ORDER — DEXAMETHASONE SODIUM PHOSPHATE 10 MG/ML IJ SOLN
INTRAMUSCULAR | Status: DC | PRN
Start: 2021-08-08 — End: 2021-08-08
  Administered 2021-08-08: 10 mg via INTRAVENOUS

## 2021-08-08 MED ORDER — PROPOFOL 10 MG/ML IV BOLUS
INTRAVENOUS | Status: DC | PRN
  Administered 2021-08-08: 20 mg via INTRAVENOUS
  Administered 2021-08-08: 130 mg via INTRAVENOUS

## 2021-08-08 MED ORDER — ACETAMINOPHEN 500 MG PO TABS
1000.0000 mg | ORAL_TABLET | ORAL | Status: AC
  Administered 2021-08-08: 1000 mg via ORAL
  Filled 2021-08-08: qty 2

## 2021-08-08 MED ORDER — PANTOPRAZOLE SODIUM 40 MG PO TBEC
40.0000 mg | DELAYED_RELEASE_TABLET | Freq: Every day | ORAL | Status: DC
  Administered 2021-08-08 – 2021-08-09 (×2): 40 mg via ORAL
  Filled 2021-08-08 (×2): qty 1

## 2021-08-08 MED ORDER — PHENYLEPHRINE 40 MCG/ML (10ML) SYRINGE FOR IV PUSH (FOR BLOOD PRESSURE SUPPORT)
PREFILLED_SYRINGE | INTRAVENOUS | Status: AC
  Filled 2021-08-08: qty 10

## 2021-08-08 MED ORDER — EPHEDRINE SULFATE-NACL 50-0.9 MG/10ML-% IV SOSY
PREFILLED_SYRINGE | INTRAVENOUS | Status: DC | PRN
  Administered 2021-08-08 (×2): 10 mg via INTRAVENOUS

## 2021-08-08 MED ORDER — SUGAMMADEX SODIUM 200 MG/2ML IV SOLN
INTRAVENOUS | Status: DC | PRN
  Administered 2021-08-08 (×2): 200 mg via INTRAVENOUS

## 2021-08-08 MED ORDER — ONDANSETRON 4 MG PO TBDP: 4.0000 mg | ORAL_TABLET | Freq: Four times a day (QID) | ORAL | Status: DC | PRN

## 2021-08-08 MED ORDER — FENTANYL CITRATE (PF) 100 MCG/2ML IJ SOLN: 50.0000 ug | Freq: Once | INTRAMUSCULAR | Status: AC

## 2021-08-08 MED ORDER — CHLORHEXIDINE GLUCONATE CLOTH 2 % EX PADS: 6.0000 | MEDICATED_PAD | Freq: Once | CUTANEOUS | Status: DC

## 2021-08-08 MED ORDER — FENTANYL CITRATE (PF) 250 MCG/5ML IJ SOLN
INTRAMUSCULAR | Status: AC
  Filled 2021-08-08: qty 5

## 2021-08-08 MED ORDER — MELATONIN 3 MG PO TABS: 3.0000 mg | ORAL_TABLET | Freq: Every evening | ORAL | Status: DC | PRN

## 2021-08-08 MED ORDER — ROCURONIUM BROMIDE 100 MG/10ML IV SOLN
INTRAVENOUS | Status: DC | PRN
  Administered 2021-08-08: 40 mg via INTRAVENOUS
  Administered 2021-08-08: 100 mg via INTRAVENOUS

## 2021-08-08 MED ORDER — ORAL CARE MOUTH RINSE: 15.0000 mL | Freq: Once | OROMUCOSAL | Status: AC

## 2021-08-08 MED ORDER — OXYCODONE HCL 5 MG PO TABS
5.0000 mg | ORAL_TABLET | ORAL | Status: DC | PRN
  Filled 2021-08-08: qty 2

## 2021-08-08 MED ORDER — KETAMINE HCL 10 MG/ML IJ SOLN
INTRAMUSCULAR | Status: DC | PRN
  Administered 2021-08-08 (×3): 10 mg via INTRAVENOUS
  Administered 2021-08-08: 20 mg via INTRAVENOUS

## 2021-08-08 MED ORDER — DIPHENHYDRAMINE HCL 50 MG/ML IJ SOLN: 12.5000 mg | Freq: Four times a day (QID) | INTRAMUSCULAR | Status: DC | PRN

## 2021-08-08 MED ORDER — ONDANSETRON HCL 4 MG/2ML IJ SOLN
INTRAMUSCULAR | Status: DC | PRN
  Administered 2021-08-08: 4 mg via INTRAVENOUS

## 2021-08-08 MED ORDER — ACETAMINOPHEN 500 MG PO TABS: 1000.0000 mg | ORAL_TABLET | Freq: Four times a day (QID) | ORAL | Status: DC | PRN

## 2021-08-08 MED ORDER — PROPOFOL 1000 MG/100ML IV EMUL
INTRAVENOUS | Status: AC
  Filled 2021-08-08: qty 100

## 2021-08-08 MED ORDER — VITAMIN D 25 MCG (1000 UNIT) PO TABS
2000.0000 [IU] | ORAL_TABLET | Freq: Every day | ORAL | Status: DC
Start: 2021-08-09 — End: 2021-08-09
  Administered 2021-08-09: 2000 [IU] via ORAL
  Filled 2021-08-08: qty 2

## 2021-08-08 MED ORDER — KETOROLAC TROMETHAMINE 30 MG/ML IJ SOLN: 30.0000 mg | Freq: Four times a day (QID) | INTRAMUSCULAR | Status: DC | PRN

## 2021-08-08 MED ORDER — FOLIC ACID 1 MG PO TABS
1.0000 mg | ORAL_TABLET | Freq: Every day | ORAL | Status: DC
  Administered 2021-08-08 – 2021-08-09 (×2): 1 mg via ORAL
  Filled 2021-08-08 (×2): qty 1

## 2021-08-08 MED ORDER — PHENYLEPHRINE 40 MCG/ML (10ML) SYRINGE FOR IV PUSH (FOR BLOOD PRESSURE SUPPORT)
PREFILLED_SYRINGE | INTRAVENOUS | Status: DC | PRN
  Administered 2021-08-08 (×5): 80 ug via INTRAVENOUS

## 2021-08-08 MED ORDER — FENTANYL CITRATE (PF) 100 MCG/2ML IJ SOLN
INTRAMUSCULAR | Status: AC
  Administered 2021-08-08: 50 ug via INTRAVENOUS
  Filled 2021-08-08: qty 2

## 2021-08-08 MED ORDER — LIDOCAINE HCL (PF) 1 % IJ SOLN
INTRAMUSCULAR | Status: AC
  Filled 2021-08-08: qty 30

## 2021-08-08 MED ORDER — BUPIVACAINE-EPINEPHRINE (PF) 0.25% -1:200000 IJ SOLN
INTRAMUSCULAR | Status: AC
  Filled 2021-08-08: qty 30

## 2021-08-08 MED ORDER — CEFAZOLIN IN SODIUM CHLORIDE 3-0.9 GM/100ML-% IV SOLN
3.0000 g | INTRAVENOUS | Status: AC
  Administered 2021-08-08: 3 g via INTRAVENOUS
  Filled 2021-08-08: qty 100

## 2021-08-08 MED ORDER — BENZONATATE 100 MG PO CAPS: 100.0000 mg | ORAL_CAPSULE | Freq: Three times a day (TID) | ORAL | Status: DC | PRN

## 2021-08-08 MED ORDER — KETAMINE HCL 50 MG/5ML IJ SOSY
PREFILLED_SYRINGE | INTRAMUSCULAR | Status: AC
  Filled 2021-08-08: qty 5

## 2021-08-08 MED ORDER — PHENYLEPHRINE HCL-NACL 20-0.9 MG/250ML-% IV SOLN
INTRAVENOUS | Status: AC
  Filled 2021-08-08: qty 500

## 2021-08-08 MED ORDER — DOCUSATE SODIUM 100 MG PO CAPS
100.0000 mg | ORAL_CAPSULE | Freq: Two times a day (BID) | ORAL | Status: DC
  Administered 2021-08-08 – 2021-08-09 (×2): 100 mg via ORAL
  Filled 2021-08-08 (×2): qty 1

## 2021-08-08 MED ORDER — KETOROLAC TROMETHAMINE 30 MG/ML IJ SOLN
30.0000 mg | Freq: Four times a day (QID) | INTRAMUSCULAR | Status: AC
  Administered 2021-08-08 – 2021-08-09 (×3): 30 mg via INTRAVENOUS
  Filled 2021-08-08 (×3): qty 1

## 2021-08-08 MED ORDER — FENTANYL CITRATE (PF) 100 MCG/2ML IJ SOLN
INTRAMUSCULAR | Status: DC | PRN
  Administered 2021-08-08: 50 ug via INTRAVENOUS
  Administered 2021-08-08: 100 ug via INTRAVENOUS
  Administered 2021-08-08 (×2): 50 ug via INTRAVENOUS

## 2021-08-08 MED ORDER — ROCURONIUM BROMIDE 10 MG/ML (PF) SYRINGE
PREFILLED_SYRINGE | INTRAVENOUS | Status: AC
  Filled 2021-08-08: qty 10

## 2021-08-08 MED ORDER — LIDOCAINE HCL (PF) 2 % IJ SOLN
INTRAMUSCULAR | Status: DC | PRN
  Administered 2021-08-08 (×2): 100 mg via PERINEURAL

## 2021-08-08 MED ORDER — BUPIVACAINE-EPINEPHRINE (PF) 0.5% -1:200000 IJ SOLN
INTRAMUSCULAR | Status: DC | PRN
  Administered 2021-08-08 (×2): 25 mL via PERINEURAL

## 2021-08-08 MED ORDER — CHLORHEXIDINE GLUCONATE 0.12 % MT SOLN
15.0000 mL | Freq: Once | OROMUCOSAL | Status: AC
  Administered 2021-08-08: 15 mL via OROMUCOSAL
  Filled 2021-08-08: qty 15

## 2021-08-08 MED ORDER — PROPOFOL 500 MG/50ML IV EMUL
INTRAVENOUS | Status: DC | PRN
  Administered 2021-08-08: 100 ug/kg/min via INTRAVENOUS
  Administered 2021-08-08 (×2): 150 ug/kg/min via INTRAVENOUS

## 2021-08-08 MED ORDER — PROPOFOL 10 MG/ML IV BOLUS
INTRAVENOUS | Status: AC
  Filled 2021-08-08: qty 20

## 2021-08-08 MED ORDER — ADULT MULTIVITAMIN W/MINERALS CH
1.0000 | ORAL_TABLET | Freq: Every day | ORAL | Status: DC
Start: 2021-08-08 — End: 2021-08-09
  Administered 2021-08-08 – 2021-08-09 (×2): 1 via ORAL
  Filled 2021-08-08 (×2): qty 1

## 2021-08-08 MED ORDER — LACTATED RINGERS IV SOLN: INTRAVENOUS | Status: DC

## 2021-08-08 MED ORDER — ONDANSETRON HCL 4 MG/2ML IJ SOLN
INTRAMUSCULAR | Status: AC
  Filled 2021-08-08: qty 2

## 2021-08-08 MED ORDER — TRIAMCINOLONE ACETONIDE 0.1 % EX CREA: TOPICAL_CREAM | Freq: Two times a day (BID) | CUTANEOUS | Status: DC | PRN

## 2021-08-08 MED ORDER — DIPHENHYDRAMINE HCL 12.5 MG/5ML PO ELIX: 12.5000 mg | ORAL_SOLUTION | Freq: Four times a day (QID) | ORAL | Status: DC | PRN

## 2021-08-08 MED ORDER — PROCHLORPERAZINE MALEATE 10 MG PO TABS
10.0000 mg | ORAL_TABLET | Freq: Four times a day (QID) | ORAL | Status: DC | PRN
  Filled 2021-08-08: qty 1

## 2021-08-08 MED ORDER — LIDOCAINE HCL 1 % IJ SOLN: INTRAMUSCULAR | Status: DC | PRN

## 2021-08-08 MED ORDER — GABAPENTIN 300 MG PO CAPS
300.0000 mg | ORAL_CAPSULE | Freq: Two times a day (BID) | ORAL | Status: DC
  Administered 2021-08-08 – 2021-08-09 (×2): 300 mg via ORAL
  Filled 2021-08-08 (×2): qty 1

## 2021-08-08 MED ORDER — MIDAZOLAM HCL 2 MG/2ML IJ SOLN
INTRAMUSCULAR | Status: AC
  Filled 2021-08-08: qty 2

## 2021-08-08 MED ORDER — LIDOCAINE 2% (20 MG/ML) 5 ML SYRINGE
INTRAMUSCULAR | Status: AC
  Filled 2021-08-08: qty 5

## 2021-08-08 MED ORDER — KCL IN DEXTROSE-NACL 20-5-0.45 MEQ/L-%-% IV SOLN
INTRAVENOUS | Status: DC
  Filled 2021-08-08: qty 1000

## 2021-08-08 MED ORDER — TRAMADOL HCL 50 MG PO TABS
50.0000 mg | ORAL_TABLET | Freq: Four times a day (QID) | ORAL | Status: DC | PRN
  Administered 2021-08-09: 50 mg via ORAL
  Filled 2021-08-08: qty 1

## 2021-08-08 MED ORDER — ONDANSETRON HCL 4 MG/2ML IJ SOLN
4.0000 mg | Freq: Four times a day (QID) | INTRAMUSCULAR | Status: DC | PRN
  Administered 2021-08-08: 4 mg via INTRAVENOUS
  Filled 2021-08-08: qty 2

## 2021-08-08 MED ORDER — MIDAZOLAM HCL 2 MG/2ML IJ SOLN: 2.0000 mg | Freq: Once | INTRAMUSCULAR | Status: AC

## 2021-08-08 MED ORDER — MORPHINE SULFATE (PF) 2 MG/ML IV SOLN: 1.0000 mg | INTRAVENOUS | Status: DC | PRN

## 2021-08-08 SURGICAL SUPPLY — 71 items
ADH SKN CLS APL DERMABOND .7 (GAUZE/BANDAGES/DRESSINGS) ×12
APL PRP STRL LF DISP 70% ISPRP (MISCELLANEOUS) ×3
BAG COUNTER SPONGE SURGICOUNT (BAG) ×4 IMPLANT
BAG SPNG CNTER NS LX DISP (BAG) ×3
BINDER BREAST LRG (GAUZE/BANDAGES/DRESSINGS) IMPLANT
BINDER BREAST XLRG (GAUZE/BANDAGES/DRESSINGS) IMPLANT
BIOPATCH RED 1 DISK 7.0 (GAUZE/BANDAGES/DRESSINGS) ×2 IMPLANT
BNDG COHESIVE 4X5 TAN STRL (GAUZE/BANDAGES/DRESSINGS) ×4 IMPLANT
CANISTER SUCT 3000ML PPV (MISCELLANEOUS) ×8 IMPLANT
CHLORAPREP W/TINT 26 (MISCELLANEOUS) ×4 IMPLANT
CLIP TI LARGE 6 (CLIP) ×1 IMPLANT
CLIP VESOCCLUDE LG 6/CT (CLIP) ×4 IMPLANT
CLIP VESOCCLUDE MED 24/CT (CLIP) ×4 IMPLANT
CNTNR URN SCR LID CUP LEK RST (MISCELLANEOUS) IMPLANT
CONT SPEC 4OZ STRL OR WHT (MISCELLANEOUS) ×8
COVER PROBE W GEL 5X96 (DRAPES) ×4 IMPLANT
COVER SURGICAL LIGHT HANDLE (MISCELLANEOUS) ×4 IMPLANT
DERMABOND ADVANCED (GAUZE/BANDAGES/DRESSINGS) ×4
DERMABOND ADVANCED .7 DNX12 (GAUZE/BANDAGES/DRESSINGS) ×3 IMPLANT
DEVICE DUBIN SPECIMEN MAMMOGRA (MISCELLANEOUS) IMPLANT
DRAPE CHEST BREAST 15X10 FENES (DRAPES) ×4 IMPLANT
DRAPE SURG 17X23 STRL (DRAPES) IMPLANT
DRSG PAD ABDOMINAL 8X10 ST (GAUZE/BANDAGES/DRESSINGS) ×5 IMPLANT
DRSG TEGADERM 4X4.75 (GAUZE/BANDAGES/DRESSINGS) ×3 IMPLANT
ELECT CAUTERY BLADE 6.4 (BLADE) ×1 IMPLANT
ELECT COATED BLADE 2.86 ST (ELECTRODE) ×4 IMPLANT
ELECT NDL BLADE 2-5/6 (NEEDLE) ×3 IMPLANT
ELECT NEEDLE BLADE 2-5/6 (NEEDLE) ×4 IMPLANT
ELECT REM PT RETURN 9FT ADLT (ELECTROSURGICAL) ×8
ELECTRODE REM PT RTRN 9FT ADLT (ELECTROSURGICAL) ×6 IMPLANT
GAUZE SPONGE 4X4 12PLY STRL (GAUZE/BANDAGES/DRESSINGS) ×5 IMPLANT
GLOVE SURG ENC MOIS LTX SZ6 (GLOVE) ×4 IMPLANT
GLOVE SURG UNDER LTX SZ6.5 (GLOVE) ×4 IMPLANT
GOWN STRL REUS W/ TWL LRG LVL3 (GOWN DISPOSABLE) ×3 IMPLANT
GOWN STRL REUS W/TWL 2XL LVL3 (GOWN DISPOSABLE) ×4 IMPLANT
GOWN STRL REUS W/TWL LRG LVL3 (GOWN DISPOSABLE) ×4
ILLUMINATOR WAVEGUIDE N/F (MISCELLANEOUS) IMPLANT
KIT BASIN OR (CUSTOM PROCEDURE TRAY) ×4 IMPLANT
KIT MARKER MARGIN INK (KITS) ×4 IMPLANT
KIT TURNOVER KIT B (KITS) ×4 IMPLANT
LIGHT WAVEGUIDE WIDE FLAT (MISCELLANEOUS) ×1 IMPLANT
NDL 18GX1X1/2 (RX/OR ONLY) (NEEDLE) IMPLANT
NDL FILTER BLUNT 18X1 1/2 (NEEDLE) IMPLANT
NDL HYPO 25GX1X1/2 BEV (NEEDLE) ×3 IMPLANT
NDL SPNL 18GX3.5 QUINCKE PK (NEEDLE) ×3 IMPLANT
NEEDLE 18GX1X1/2 (RX/OR ONLY) (NEEDLE) IMPLANT
NEEDLE FILTER BLUNT 18X 1/2SAF (NEEDLE)
NEEDLE FILTER BLUNT 18X1 1/2 (NEEDLE) IMPLANT
NEEDLE HYPO 25GX1X1/2 BEV (NEEDLE) ×4 IMPLANT
NEEDLE SPNL 18GX3.5 QUINCKE PK (NEEDLE) ×4 IMPLANT
NS IRRIG 1000ML POUR BTL (IV SOLUTION) ×4 IMPLANT
PACK GENERAL/GYN (CUSTOM PROCEDURE TRAY) ×4 IMPLANT
PACK UNIVERSAL I (CUSTOM PROCEDURE TRAY) ×4 IMPLANT
PAD ARMBOARD 7.5X6 YLW CONV (MISCELLANEOUS) ×4 IMPLANT
PENCIL SMOKE EVACUATOR (MISCELLANEOUS) ×4 IMPLANT
SPONGE LAP 18X18 X RAY DECT (DISPOSABLE) ×3 IMPLANT
STOCKINETTE IMPERVIOUS 9X36 MD (GAUZE/BANDAGES/DRESSINGS) ×4 IMPLANT
STRIP CLOSURE SKIN 1/2X4 (GAUZE/BANDAGES/DRESSINGS) ×6 IMPLANT
SUT ETHILON 2 0 FS 18 (SUTURE) ×5 IMPLANT
SUT MNCRL AB 4-0 PS2 18 (SUTURE) ×8 IMPLANT
SUT MON AB 4-0 PC3 18 (SUTURE) ×4 IMPLANT
SUT SILK 2 0 SH (SUTURE) ×1 IMPLANT
SUT VIC AB 2-0 SH 27 (SUTURE) ×4
SUT VIC AB 2-0 SH 27XBRD (SUTURE) IMPLANT
SUT VIC AB 3-0 SH 8-18 (SUTURE) ×6 IMPLANT
SYR 50ML LL SCALE MARK (SYRINGE) ×8 IMPLANT
SYR CONTROL 10ML LL (SYRINGE) ×4 IMPLANT
TOWEL GREEN STERILE (TOWEL DISPOSABLE) ×4 IMPLANT
TOWEL GREEN STERILE FF (TOWEL DISPOSABLE) ×4 IMPLANT
TRACER MAGTRACE VIAL (MISCELLANEOUS) ×2 IMPLANT
TUBE CONNECTING 12X1/4 (SUCTIONS) ×4 IMPLANT

## 2021-08-08 NOTE — Anesthesia Procedure Notes (Signed)
Anesthesia Regional Block: Pectoralis block   Pre-Anesthetic Checklist: , timeout performed,  Correct Patient, Correct Site, Correct Laterality,  Correct Procedure, Correct Position, site marked,  Risks and benefits discussed,  Surgical consent,  Pre-op evaluation,  At surgeon's request and post-op pain management  Laterality: Left  Prep: chloraprep       Needles:  Injection technique: Single-shot      Needle Length: 9cm  Needle Gauge: 22     Additional Needles: Arrow StimuQuik ECHO Echogenic Stimulating PNB Needle  Procedures:,,,, ultrasound used (permanent image in chart),,    Narrative:  Start time: 08/08/2021 8:12 AM End time: 08/08/2021 8:16 AM Injection made incrementally with aspirations every 5 mL.  Performed by: Personally  Anesthesiologist: Oleta Mouse, MD

## 2021-08-08 NOTE — Anesthesia Procedure Notes (Signed)
Anesthesia Regional Block: Pectoralis block   Pre-Anesthetic Checklist: , timeout performed,  Correct Patient, Correct Site, Correct Laterality,  Correct Procedure, Correct Position, site marked,  Risks and benefits discussed,  Surgical consent,  Pre-op evaluation,  At surgeon's request and post-op pain management  Laterality: Right  Prep: chloraprep       Needles:  Injection technique: Single-shot      Needle Length: 9cm  Needle Gauge: 22     Additional Needles: Arrow StimuQuik ECHO Echogenic Stimulating PNB Needle  Procedures:,,,, ultrasound used (permanent image in chart),,    Narrative:  Start time: 08/08/2021 8:05 AM End time: 08/08/2021 8:11 AM Injection made incrementally with aspirations every 5 mL.  Performed by: Personally  Anesthesiologist: Oleta Mouse, MD

## 2021-08-08 NOTE — Anesthesia Preprocedure Evaluation (Signed)
Anesthesia Evaluation  Patient identified by MRN, date of birth, ID band Patient awake    Reviewed: Allergy & Precautions, NPO status , Patient's Chart, lab work & pertinent test results  History of Anesthesia Complications (+) PONV and history of anesthetic complications  Airway Mallampati: III  TM Distance: >3 FB Neck ROM: Full    Dental  (+) Partial Upper, Dental Advisory Given   Pulmonary neg shortness of breath, sleep apnea , neg COPD, former smoker,    breath sounds clear to auscultation       Cardiovascular hypertension, Pt. on medications (-) angina(-) Past MI and (-) CHF  Rhythm:Regular  1. Left ventricular ejection fraction, by estimation, is 60 to 65%. The  left ventricle has normal function. The left ventricle has no regional  wall motion abnormalities. There is mild concentric left ventricular  hypertrophy. Left ventricular diastolic  parameters are consistent with Grade I diastolic dysfunction (impaired  relaxation). The average left ventricular global longitudinal strain is  -27.6 %. The global longitudinal strain is normal.  2. Right ventricular systolic function is normal. The right ventricular  size is normal. There is normal pulmonary artery systolic pressure.  3. The mitral valve is normal in structure. Trivial mitral valve  regurgitation. No evidence of mitral stenosis.  4. The aortic valve is tricuspid. Aortic valve regurgitation is not  visualized. No aortic stenosis is present.  5. Aortic dilatation noted. There is mild dilatation of the ascending  aorta, measuring 42 mm.  6. The inferior vena cava is normal in size with greater than 50%  respiratory variability, suggesting right atrial pressure of 3 mmHg.    Neuro/Psych  Headaches, neg Seizures negative psych ROS   GI/Hepatic Neg liver ROS, hiatal hernia, GERD  Medicated and Controlled,  Endo/Other    Renal/GU Renal InsufficiencyRenal  diseaseLab Results      Component                Value               Date                      CREATININE               1.17 (H)            08/04/2021                Musculoskeletal  (+) Arthritis ,   Abdominal   Peds  Hematology  (+) Blood dyscrasia, anemia , Lab Results      Component                Value               Date                      WBC                      8.5                 08/04/2021                HGB                      9.9 (L)             08/04/2021  HCT                      31.9 (L)            08/04/2021                MCV                      96.4                08/04/2021                PLT                      164                 08/04/2021              Anesthesia Other Findings   Reproductive/Obstetrics                             Anesthesia Physical Anesthesia Plan  ASA: 3  Anesthesia Plan: General and Regional   Post-op Pain Management:    Induction:   PONV Risk Score and Plan: 4 or greater and Ondansetron, Dexamethasone, Propofol infusion, TIVA, Midazolam and Scopolamine patch - Pre-op  Airway Management Planned: Oral ETT  Additional Equipment: None  Intra-op Plan:   Post-operative Plan: Extubation in OR  Informed Consent: I have reviewed the patients History and Physical, chart, labs and discussed the procedure including the risks, benefits and alternatives for the proposed anesthesia with the patient or authorized representative who has indicated his/her understanding and acceptance.     Dental advisory given  Plan Discussed with: Anesthesiologist and CRNA  Anesthesia Plan Comments:         Anesthesia Quick Evaluation

## 2021-08-08 NOTE — Discharge Instructions (Signed)
[facility] °[phone] ° °MASTECTOMY: POST OP INSTRUCTIONS ° °Always review your discharge instruction sheet given to you by the facility where your surgery was performed. °IF YOU HAVE DISABILITY OR FAMILY LEAVE FORMS, YOU MUST BRING THEM TO THE OFFICE FOR PROCESSING.   °DO NOT GIVE THEM TO YOUR DOCTOR. °A prescription for pain medication may be given to you upon discharge.  Take your pain medication as prescribed, if needed.  If narcotic pain medicine is not needed, then you may take acetaminophen (Tylenol) or ibuprofen (Advil) as needed. °Take your usually prescribed medications unless otherwise directed. °If you need a refill on your pain medication, please contact your pharmacy.  They will contact our office to request authorization.  Prescriptions will not be filled after 5pm or on week-ends. °You should follow a light diet the first few days after arrival home, such as soup and crackers, etc.  Resume your normal diet the day after surgery. °Most patients will experience some swelling and bruising on the chest and underarm.  Ice packs will help.  Swelling and bruising can take several days to resolve.  °It is common to experience some constipation if taking pain medication after surgery.  Increasing fluid intake and taking a stool softener (such as Colace) will usually help or prevent this problem from occurring.  A mild laxative (Milk of Magnesia or Miralax) should be taken according to package instructions if there are no bowel movements after 48 hours. °Unless discharge instructions indicate otherwise, leave your bandage dry and in place until your next appointment in 3-5 days.  You may take a limited sponge bath.  No tube baths or showers until the drains are removed.  You may have steri-strips (small skin tapes) in place directly over the incision.  These strips should be left on the skin for 7-10 days.  If your surgeon used skin glue on the incision, you may shower in 24 hours.   The glue will flake off over the next 2-3 weeks.  Any sutures or staples will be removed at the office during your follow-up visit. °DRAINS:  If you have drains in place, it is important to keep a list of the amount of drainage produced each day in your drains.  Before leaving the hospital, you should be instructed on drain care.  Call our office if you have any questions about your drains. °ACTIVITIES:  You may resume regular (light) daily activities beginning the next day--such as daily self-care, walking, climbing stairs--gradually increasing activities as tolerated.  You may have sexual intercourse when it is comfortable.  Refrain from any heavy lifting or straining until approved by your doctor. °You may drive when you are no longer taking prescription pain medication, you can comfortably wear a seatbelt, and you can safely maneuver your car and apply brakes. °RETURN TO WORK:  __________________________________________________________ °You should see your doctor in the office for a follow-up appointment approximately 3-5 days after your surgery.  Your doctor’s nurse will typically make your follow-up appointment when she calls you with your pathology report.  Expect your pathology report 2-3 business days after your surgery.  You may call to check if you do not hear from us after three days.   °OTHER INSTRUCTIONS: ______________________________________________________________________________________________ ____________________________________________________________________________________________ °WHEN TO CALL YOUR DOCTOR: °Fever over 101.0 °Nausea and/or vomiting °Extreme swelling or bruising °Continued bleeding from incision. °Increased pain, redness, or drainage from the incision. °The clinic staff is available to answer your questions during regular business hours.  Please don’t hesitate   to call and ask to speak to one of the nurses for clinical concerns.  If you have a medical emergency, go to the  nearest emergency room or call 911.  A surgeon from [facility] is always on call at the hospital. °[address], [address], [place], [place]  [address] ? [address] [address], [place],    [address] °[phone] ? [phone] ? FAX [phone] ° ° °

## 2021-08-08 NOTE — Op Note (Signed)
Bilateral Mastectomy, bilateral Sentinel Node Biopsies and right seed targeted lymph node biopsy  Indications: This patient presents with history of bilateral breast cancer   Pre-operative Diagnosis: bilateral breast cancer; Left [id number], upper inner quadrant, receptors +/-/- Right breast cancer [id number]  Post-operative Diagnosis: same  Surgeon: Stark Klein   Anesthesia: General endotracheal anesthesia and pectoral block  ASA Class: 3  Procedure Details  The patient was seen in the Holding Room. The risks, benefits, complications, treatment options, and expected outcomes were discussed with the patient. The possibilities of reaction to medication, pulmonary aspiration, bleeding, infection, the need for additional procedures, failure to diagnose a condition, and creating a complication requiring transfusion or operation were discussed with the patient. The patient concurred with the proposed plan, giving informed consent.  The site of surgery properly noted/marked. The patient was taken to Operating Room # 2, identified as Ronni Osterberg and the procedure verified as bilateral Mastectomy and Sentinel Node Biopsies, right axillary seed localized . A Time Out was held and the above information confirmed.  The MagTrace was injected in the subareolar location bilaterally.    After induction of anesthesia, the right arm, bilateral breast and chest were prepped and draped in standard fashion.   The borders of the breast were identified and marked. A wise-type incision was drawn out.  The incisions of the breast were drawn out to make sure incision lines were equidistant in length.    The superior incision was made with the #10 blade.  Mastectomy hooks were used to provide elevation of the skin edges, and the cautery was used to create the mastectomy flaps.  The dissection was taken to the fascia of the pectoralis major.  The penetrating vessels were clipped.  The superior flap was taken medially to the  lateral sternal border, superiorly to the inferior border of the clavicle.  The inferior flap was similarly created, inferiorly to the inframammary fold and laterally to the border of the latissimus.  The breast was taken off including the pectoralis fascia and the axillary tail marked.    Using a hand-held gamma probe, axillary sentinel nodes were identified.  Two deep level 2 axillary sentinel nodes were removed and submitted to pathology.  One of these had a seed in it.  The findings are below.  The lymphovascular channels were clipped with metal clips.        The wound was irrigated. One 19 Blake drain was placed laterally.   Hemostasis was achieved with cautery.  The wound was irrigated and closed with a 3-0 Vicryl deep dermal interrupted sutures and 4-0 Vicryl subcuticular closure in layers.    Sterile dressings were applied. At the end of the operation, all sponge, instrument, and needle counts were correct.  Findings: grossly clear surgical margins  Estimated Blood Loss: min          Drains: 19 Fr blake drain in bilateral axilla                Specimens: right breast, right axillary node with seed, right axillary sentinel node and 4 left axillary sentinel nodes         Complications:  None; patient tolerated the procedure well.         Disposition: PACU - hemodynamically stable.         Condition: stable

## 2021-08-08 NOTE — Interval H&P Note (Signed)
History and Physical Interval Note:  08/08/2021 9:08 AM  Leslie Wright  has presented today for surgery, with the diagnosis of BILATERAL BREAST CANCER.  The various methods of treatment have been discussed with the patient and family. After consideration of risks, benefits and other options for treatment, the patient has consented to  Procedure(s): BILATERAL TOTAL MASTECTOMY (Bilateral) RADIOACTIVE SEED GUIDED RIGHT AXILLARY SENTINEL LYMPH NODE BIOPSY (Right) BILATERAL SENTINEL NODE BIOPSY (Bilateral) as a surgical intervention.  The patient's history has been reviewed, patient examined, no change in status, stable for surgery.  I have reviewed the patient's chart and labs.  Questions were answered to the patient's satisfaction.     Stark Klein

## 2021-08-08 NOTE — Anesthesia Procedure Notes (Signed)
Procedure Name: Intubation Date/Time: 08/08/2021 9:42 AM Performed by: Ezequiel Kayser, CRNA Pre-anesthesia Checklist: Patient identified, Emergency Drugs available, Suction available and Patient being monitored Patient Re-evaluated:Patient Re-evaluated prior to induction Oxygen Delivery Method: Circle System Utilized Preoxygenation: Pre-oxygenation with 100% oxygen Induction Type: IV induction Ventilation: Mask ventilation without difficulty Laryngoscope Size: Mac and 3 Grade View: Grade I Tube type: Oral Tube size: 7.0 mm Number of attempts: 1 Airway Equipment and Method: Stylet and Oral airway Placement Confirmation: ETT inserted through vocal cords under direct vision, positive ETCO2 and breath sounds checked- equal and bilateral Secured at: 22 cm Tube secured with: Tape Dental Injury: Teeth and Oropharynx as per pre-operative assessment

## 2021-08-08 NOTE — Transfer of Care (Signed)
Immediate Anesthesia Transfer of Care Note  Patient: Leslie Wright  Procedure(s) Performed: BILATERAL TOTAL MASTECTOMY (Bilateral: Breast) RADIOACTIVE SEED GUIDED RIGHT AXILLARY SENTINEL LYMPH NODE BIOPSY (Right: Axilla) BILATERAL SENTINEL NODE BIOPSY (Bilateral: Axilla)  Patient Location: PACU  Anesthesia Type:General and Regional  Level of Consciousness: drowsy  Airway & Oxygen Therapy: Patient Spontanous Breathing, Patient connected to face mask oxygen and Patient connected to face mask  Post-op Assessment: Report given to RN and Post -op Vital signs reviewed and stable  Post vital signs: Reviewed and stable  Last Vitals:  Vitals Value Taken Time  BP    Temp    Pulse    Resp    SpO2      Last Pain:  Vitals:   08/08/21 0731  TempSrc:   PainSc: 0-No pain         Complications: No notable events documented.

## 2021-08-09 ENCOUNTER — Other Ambulatory Visit (HOSPITAL_COMMUNITY): Payer: Self-pay

## 2021-08-09 ENCOUNTER — Encounter (HOSPITAL_COMMUNITY): Payer: Self-pay | Admitting: General Surgery

## 2021-08-09 DIAGNOSIS — I129 Hypertensive chronic kidney disease with stage 1 through stage 4 chronic kidney disease, or unspecified chronic kidney disease: Secondary | ICD-10-CM | POA: Diagnosis not present

## 2021-08-09 DIAGNOSIS — Z86718 Personal history of other venous thrombosis and embolism: Secondary | ICD-10-CM | POA: Diagnosis not present

## 2021-08-09 DIAGNOSIS — Z170 Estrogen receptor positive status [ER+]: Secondary | ICD-10-CM | POA: Diagnosis not present

## 2021-08-09 DIAGNOSIS — C50511 Malignant neoplasm of lower-outer quadrant of right female breast: Secondary | ICD-10-CM | POA: Diagnosis not present

## 2021-08-09 DIAGNOSIS — R7303 Prediabetes: Secondary | ICD-10-CM | POA: Diagnosis not present

## 2021-08-09 DIAGNOSIS — N6021 Fibroadenosis of right breast: Secondary | ICD-10-CM | POA: Diagnosis not present

## 2021-08-09 DIAGNOSIS — C50812 Malignant neoplasm of overlapping sites of left female breast: Secondary | ICD-10-CM | POA: Diagnosis not present

## 2021-08-09 DIAGNOSIS — K760 Fatty (change of) liver, not elsewhere classified: Secondary | ICD-10-CM | POA: Diagnosis not present

## 2021-08-09 DIAGNOSIS — N183 Chronic kidney disease, stage 3 unspecified: Secondary | ICD-10-CM | POA: Diagnosis not present

## 2021-08-09 LAB — BASIC METABOLIC PANEL
Anion gap: 9 (ref 5–15)
BUN: 15 mg/dL (ref 6–20)
CO2: 25 mmol/L (ref 22–32)
Calcium: 8.8 mg/dL — ABNORMAL LOW (ref 8.9–10.3)
Chloride: 103 mmol/L (ref 98–111)
Creatinine, Ser: 1.26 mg/dL — ABNORMAL HIGH (ref 0.44–1.00)
GFR, Estimated: 50 mL/min — ABNORMAL LOW (ref >60)
Glucose, Bld: 106 mg/dL — ABNORMAL HIGH (ref 70–99)
Potassium: 3.9 mmol/L (ref 3.5–5.1)
Sodium: 137 mmol/L (ref 135–145)

## 2021-08-09 LAB — CBC
HCT: 24.5 % — ABNORMAL LOW (ref 36.0–46.0)
Hemoglobin: 8 g/dL — ABNORMAL LOW (ref 12.0–15.0)
MCH: 30.3 pg (ref 26.0–34.0)
MCHC: 32.7 g/dL (ref 30.0–36.0)
MCV: 92.8 fL (ref 80.0–100.0)
Platelets: 136 10*3/uL — ABNORMAL LOW (ref 150–400)
RBC: 2.64 MIL/uL — ABNORMAL LOW (ref 3.87–5.11)
RDW: 14.4 % (ref 11.5–15.5)
WBC: 11.9 10*3/uL — ABNORMAL HIGH (ref 4.0–10.5)
nRBC: 0 % (ref 0.0–0.2)

## 2021-08-09 MED ORDER — SODIUM CHLORIDE 0.9% FLUSH: 10.0000 mL | INTRAVENOUS | Status: DC | PRN

## 2021-08-09 MED ORDER — ALTEPLASE 2 MG IJ SOLR
2.0000 mg | Freq: Once | INTRAMUSCULAR | Status: AC
  Administered 2021-08-09: 2 mg
  Filled 2021-08-09: qty 2

## 2021-08-09 MED ORDER — CHLORHEXIDINE GLUCONATE CLOTH 2 % EX PADS
6.0000 | MEDICATED_PAD | Freq: Every day | CUTANEOUS | Status: DC
  Administered 2021-08-09: 6 via TOPICAL

## 2021-08-09 MED ORDER — HEPARIN SOD (PORK) LOCK FLUSH 100 UNIT/ML IV SOLN
500.0000 [IU] | INTRAVENOUS | Status: AC | PRN
  Administered 2021-08-09: 500 [IU]
  Filled 2021-08-09: qty 5

## 2021-08-09 MED ORDER — TRAMADOL HCL 50 MG PO TABS
50.0000 mg | ORAL_TABLET | Freq: Four times a day (QID) | ORAL | 1 refills | Status: DC | PRN
  Filled 2021-08-09: qty 30, 8d supply, fill #0

## 2021-08-09 MED ORDER — METHOCARBAMOL 500 MG PO TABS
500.0000 mg | ORAL_TABLET | Freq: Four times a day (QID) | ORAL | 1 refills | Status: DC | PRN
Start: 2021-08-09 — End: 2021-09-28
  Filled 2021-08-09: qty 20, 5d supply, fill #0

## 2021-08-09 NOTE — Discharge Summary (Signed)
Physician Discharge Summary  Patient ID: Leslie Wright MRN: [id number] DOB/AGE: 09-Aug-1966 55 y.o.  Admit date: 08/08/2021 Discharge date: 08/09/2021  Admission Diagnoses: Bilateral breast cancer CKD stage 3 HTN Prediabetes H/o DVT 2019 Hepatic steatosis  Discharge Diagnoses:  Active Problems:   Bilateral breast cancer [facility]) And same as above  Discharged Condition: stable  Hospital Course:  Pt was admitted to the floor following bilateral mastectomies, bilateral sentinel node biopsies, and seed localized right axillary lymph node biopsy 08/08/2021.  She had no significant pain overnight and only took a few doses of tramadol.  She has low volume serosanguinous drain output and no evidence of flap hematoma.  She is ambulatory and able to eat.  She is discharged in good condition.  She has received drain teaching.   Consults: None  Significant Diagnostic Studies: labs: HCT prior to d/c 24.5, but on 10/28 it was 28.9.   Treatments: surgery: see above  Discharge Exam: Blood pressure 111/61, pulse 64, temperature 97.6 F (36.4 C), temperature source Oral, resp. rate 18, height 5' 7"  (1.702 m), weight 118.4 kg, SpO2 96 %. General appearance: alert, cooperative, and no distress Resp: breathing comfortably Chest wall: bilateral chest wall tenderness as expected, no flap hematoma Extremities: extremities normal, atraumatic, no cyanosis or edema  Disposition: Discharge disposition: 01-Home or Self Care      Discharge Instructions     Call MD for:  difficulty breathing, headache or visual disturbances   Complete by: As directed    Call MD for:  hives   Complete by: As directed    Call MD for:  persistant nausea and vomiting   Complete by: As directed    Call MD for:  redness, tenderness, or signs of infection (pain, swelling, redness, odor or green/yellow discharge around incision site)   Complete by: As directed    Call MD for:  severe uncontrolled pain   Complete by: As  directed    Call MD for:  temperature >100.4   Complete by: As directed    Change dressing (specify)   Complete by: As directed    Measure and record drain output as needed.  Usually 2-3 times per day at first and then probably once per day is OK. Bring record to clinic.   Diet - low sodium heart healthy   Complete by: As directed    Increase activity slowly   Complete by: As directed       Allergies as of 08/09/2021   No Known Allergies      Medication List     TAKE these medications    acetaminophen 500 MG tablet Commonly known as: TYLENOL Take 1,000 mg by mouth every 6 (six) hours as needed for moderate pain, mild pain or headache.   aspirin EC 81 MG tablet Take 81 mg by mouth daily.   Azelastine HCl 137 MCG/SPRAY Soln Use 1 - 2 sprays each nostril 1 - 2 times a day for nasal congestion   benzonatate 200 MG capsule Commonly known as: TESSALON Take 1 capsule by mouth 3 times a day when needed for cough   betamethasone valerate ointment 0.1 % Commonly known as: VALISONE Apply 1 application topically 2 (two) times daily.   folic acid 1 MG tablet Commonly known as: FOLVITE Take 1 mg by mouth daily.   freestyle lancets USE AS DIRECTED TO CHECK BLOOD SUGAR 2 TO 3 TIMES A DAY   FREESTYLE LITE test strip Generic drug: glucose blood USE AS DIRECTED TO CHECK BLOOD  SUGAR 1 TO 3 TIMES A DAY   FreeStyle Lite w/Device Kit CHECK GLUCOSE 2-3 TIMES DAILY   ketoconazole 2 % cream Commonly known as: NIZORAL Apply 1 application topically daily.   losartan 100 MG tablet Commonly known as: COZAAR Take 1 tablet by mouth daily   methocarbamol 500 MG tablet Commonly known as: ROBAXIN Take 1 tablet (500 mg total) by mouth every 6 (six) hours as needed for muscle spasms.   omeprazole 40 MG capsule Commonly known as: PRILOSEC Take 1 capsule (40 mg total) by mouth at bedtime.   ondansetron 8 MG tablet Commonly known as: ZOFRAN Take 1 tablet (8 mg total) by mouth every  8 (eight) hours as needed for nausea or vomiting. Do not take days 1 and 2 after chemotherapy (Start day 3 after Chemotherapy.)   rosuvastatin 10 MG tablet Commonly known as: CRESTOR TAKE 1 TABLET BY MOUTH ONCE A DAY AT BEDTIME FOR CHOLESTEROL   traMADol 50 MG tablet Commonly known as: ULTRAM Take 1 tablet (50 mg total) by mouth every 6 (six) hours as needed (mild pain).               Discharge Care Instructions  (From admission, onward)           Start     Ordered   08/09/21 0000  Change dressing (specify)       Comments: Measure and record drain output as needed.  Usually 2-3 times per day at first and then probably once per day is OK. Bring record to clinic.   08/09/21 1312            Follow-up Information     Stark Klein, MD Follow up in 2 week(s).   Specialty: General Surgery Contact information: [address] [place] [address] [phone]                 Signed: Stark Klein 08/09/2021, 1:12 PM

## 2021-08-11 NOTE — Anesthesia Postprocedure Evaluation (Signed)
Anesthesia Post Note  Patient: Leslie Wright  Procedure(s) Performed: BILATERAL TOTAL MASTECTOMY (Bilateral: Breast) RADIOACTIVE SEED GUIDED RIGHT AXILLARY SENTINEL LYMPH NODE BIOPSY (Right: Axilla) BILATERAL SENTINEL NODE BIOPSY (Bilateral: Axilla)     Patient location during evaluation: PACU Anesthesia Type: Regional and General Level of consciousness: awake and alert Pain management: pain level controlled Vital Signs Assessment: post-procedure vital signs reviewed and stable Respiratory status: spontaneous breathing, nonlabored ventilation, respiratory function stable and patient connected to nasal cannula oxygen Cardiovascular status: blood pressure returned to baseline and stable Postop Assessment: no apparent nausea or vomiting Anesthetic complications: no   No notable events documented.  Last Vitals:  Vitals:   08/09/21 0548 08/09/21 0837  BP: (!) 114/54 111/61  Pulse: 64 64  Resp: 17 18  Temp: 36.4 C 36.4 C  SpO2: 96% 96%    Last Pain:  Vitals:   08/09/21 1300  TempSrc:   PainSc: 0-No pain                 Shrey Boike

## 2021-08-14 ENCOUNTER — Encounter: Payer: Self-pay | Admitting: *Deleted

## 2021-08-17 ENCOUNTER — Encounter: Payer: Self-pay | Admitting: *Deleted

## 2021-08-18 ENCOUNTER — Ambulatory Visit: Payer: 59

## 2021-08-18 ENCOUNTER — Inpatient Hospital Stay: Payer: 59 | Attending: Oncology

## 2021-08-18 ENCOUNTER — Other Ambulatory Visit: Payer: 59

## 2021-08-18 ENCOUNTER — Other Ambulatory Visit: Payer: Self-pay

## 2021-08-18 ENCOUNTER — Inpatient Hospital Stay: Payer: 59

## 2021-08-18 VITALS — BP 138/72 | HR 69 | Temp 98.2°F | Resp 18 | Wt 259.0 lb

## 2021-08-18 DIAGNOSIS — Z171 Estrogen receptor negative status [ER-]: Secondary | ICD-10-CM | POA: Diagnosis not present

## 2021-08-18 DIAGNOSIS — Z79899 Other long term (current) drug therapy: Secondary | ICD-10-CM | POA: Diagnosis not present

## 2021-08-18 DIAGNOSIS — Z95828 Presence of other vascular implants and grafts: Secondary | ICD-10-CM

## 2021-08-18 DIAGNOSIS — Z170 Estrogen receptor positive status [ER+]: Secondary | ICD-10-CM | POA: Insufficient documentation

## 2021-08-18 DIAGNOSIS — Z5112 Encounter for antineoplastic immunotherapy: Secondary | ICD-10-CM | POA: Diagnosis not present

## 2021-08-18 DIAGNOSIS — K760 Fatty (change of) liver, not elsewhere classified: Secondary | ICD-10-CM

## 2021-08-18 DIAGNOSIS — C50812 Malignant neoplasm of overlapping sites of left female breast: Secondary | ICD-10-CM

## 2021-08-18 DIAGNOSIS — C50911 Malignant neoplasm of unspecified site of right female breast: Secondary | ICD-10-CM | POA: Insufficient documentation

## 2021-08-18 DIAGNOSIS — M539 Dorsopathy, unspecified: Secondary | ICD-10-CM

## 2021-08-18 DIAGNOSIS — C50511 Malignant neoplasm of lower-outer quadrant of right female breast: Secondary | ICD-10-CM

## 2021-08-18 LAB — CBC WITH DIFFERENTIAL/PLATELET
Abs Immature Granulocytes: 0.06 10*3/uL (ref 0.00–0.07)
Basophils Absolute: 0 10*3/uL (ref 0.0–0.1)
Basophils Relative: 0 %
Eosinophils Absolute: 0.5 10*3/uL (ref 0.0–0.5)
Eosinophils Relative: 5 %
HCT: 30.8 % — ABNORMAL LOW (ref 36.0–46.0)
Hemoglobin: 9.9 g/dL — ABNORMAL LOW (ref 12.0–15.0)
Immature Granulocytes: 1 %
Lymphocytes Relative: 8 %
Lymphs Abs: 0.8 10*3/uL (ref 0.7–4.0)
MCH: 29.5 pg (ref 26.0–34.0)
MCHC: 32.1 g/dL (ref 30.0–36.0)
MCV: 91.7 fL (ref 80.0–100.0)
Monocytes Absolute: 0.6 10*3/uL (ref 0.1–1.0)
Monocytes Relative: 5 %
Neutro Abs: 8.6 10*3/uL — ABNORMAL HIGH (ref 1.7–7.7)
Neutrophils Relative %: 81 %
Platelets: 180 10*3/uL (ref 150–400)
RBC: 3.36 MIL/uL — ABNORMAL LOW (ref 3.87–5.11)
RDW: 14.5 % (ref 11.5–15.5)
WBC: 10.6 10*3/uL — ABNORMAL HIGH (ref 4.0–10.5)
nRBC: 0 % (ref 0.0–0.2)

## 2021-08-18 LAB — COMPREHENSIVE METABOLIC PANEL
ALT: 12 U/L (ref 0–44)
AST: 16 U/L (ref 15–41)
Albumin: 3.2 g/dL — ABNORMAL LOW (ref 3.5–5.0)
Alkaline Phosphatase: 69 U/L (ref 38–126)
Anion gap: 12 (ref 5–15)
BUN: 13 mg/dL (ref 6–20)
CO2: 23 mmol/L (ref 22–32)
Calcium: 9.4 mg/dL (ref 8.9–10.3)
Chloride: 107 mmol/L (ref 98–111)
Creatinine, Ser: 1.18 mg/dL — ABNORMAL HIGH (ref 0.44–1.00)
GFR, Estimated: 54 mL/min — ABNORMAL LOW (ref >60)
Glucose, Bld: 120 mg/dL — ABNORMAL HIGH (ref 70–99)
Potassium: 3.3 mmol/L — ABNORMAL LOW (ref 3.5–5.1)
Sodium: 142 mmol/L (ref 135–145)
Total Bilirubin: <0.2 mg/dL — ABNORMAL LOW (ref 0.3–1.2)
Total Protein: 7 g/dL (ref 6.5–8.1)

## 2021-08-18 LAB — TSH: TSH: 1.275 u[IU]/mL (ref 0.308–3.960)

## 2021-08-18 MED ORDER — SODIUM CHLORIDE 0.9% FLUSH
10.0000 mL | Freq: Once | INTRAVENOUS | Status: AC
  Administered 2021-08-18: 10 mL

## 2021-08-18 MED ORDER — SODIUM CHLORIDE 0.9 % IV SOLN
200.0000 mg | Freq: Once | INTRAVENOUS | Status: AC
  Administered 2021-08-18: 200 mg via INTRAVENOUS
  Filled 2021-08-18: qty 8

## 2021-08-18 MED ORDER — SODIUM CHLORIDE 0.9 % IV SOLN: Freq: Once | INTRAVENOUS | Status: AC

## 2021-08-26 DIAGNOSIS — J9601 Acute respiratory failure with hypoxia: Secondary | ICD-10-CM | POA: Diagnosis not present

## 2021-08-28 ENCOUNTER — Encounter (HOSPITAL_COMMUNITY): Payer: Self-pay | Admitting: General Surgery

## 2021-08-28 ENCOUNTER — Other Ambulatory Visit (HOSPITAL_COMMUNITY): Payer: Self-pay

## 2021-08-28 MED ORDER — DOXYCYCLINE MONOHYDRATE 100 MG PO CAPS
ORAL_CAPSULE | ORAL | 1 refills | Status: DC
  Filled 2021-08-28: qty 20, 10d supply, fill #0

## 2021-08-29 ENCOUNTER — Encounter: Payer: Self-pay | Admitting: Physical Therapy

## 2021-08-29 ENCOUNTER — Other Ambulatory Visit: Payer: Self-pay

## 2021-08-29 ENCOUNTER — Ambulatory Visit: Payer: 59 | Attending: General Surgery | Admitting: Physical Therapy

## 2021-08-29 DIAGNOSIS — M25612 Stiffness of left shoulder, not elsewhere classified: Secondary | ICD-10-CM | POA: Insufficient documentation

## 2021-08-29 DIAGNOSIS — C50511 Malignant neoplasm of lower-outer quadrant of right female breast: Secondary | ICD-10-CM | POA: Insufficient documentation

## 2021-08-29 DIAGNOSIS — Z171 Estrogen receptor negative status [ER-]: Secondary | ICD-10-CM | POA: Insufficient documentation

## 2021-08-29 DIAGNOSIS — Z483 Aftercare following surgery for neoplasm: Secondary | ICD-10-CM | POA: Insufficient documentation

## 2021-08-29 DIAGNOSIS — M25611 Stiffness of right shoulder, not elsewhere classified: Secondary | ICD-10-CM | POA: Diagnosis not present

## 2021-08-29 DIAGNOSIS — R293 Abnormal posture: Secondary | ICD-10-CM | POA: Insufficient documentation

## 2021-08-29 DIAGNOSIS — Z170 Estrogen receptor positive status [ER+]: Secondary | ICD-10-CM | POA: Diagnosis not present

## 2021-08-29 DIAGNOSIS — M6281 Muscle weakness (generalized): Secondary | ICD-10-CM | POA: Diagnosis not present

## 2021-08-29 DIAGNOSIS — C50512 Malignant neoplasm of lower-outer quadrant of left female breast: Secondary | ICD-10-CM | POA: Diagnosis not present

## 2021-08-29 NOTE — Patient Instructions (Signed)
[facility]  [address], [address]  [place] [address]  [phone]  After Breast Cancer Class It is recommended you attend the ABC class to be educated on lymphedema risk reduction. This class is free of charge and lasts for 1 hour. It is a 1-time class. You will need to download the Webex app either on your phone or computer. We will send you a link the night before or the morning of the class. You should be able to click on that link to join the class. This is not a confidential class. You don't have to turn your camera on, but other participants may be able to see your email address.  Scar massage You can begin gentle scar massage to you incision sites. Gently place one hand on the incision and move the skin (without sliding on the skin) in various directions. Do this for a few minutes and then you can gently massage either coconut oil or vitamin E cream into the scars.  Compression garment You should continue wearing your compression bra until you feel like you no longer have swelling.  Home exercise Program Continue doing the exercises you were given until you feel like you can do them without feeling any tightness at the end.   Walking Program Studies show that 30 minutes of walking per day (fast enough to elevate your heart rate) can significantly reduce the risk of a cancer recurrence. If you can't walk due to other medical reasons, we encourage you to find another activity you could do (like a stationary bike or water exercise).  Posture After breast cancer surgery, people frequently sit with rounded shoulders posture because it puts their incisions on slack and feels better. If you sit like this and scar tissue forms in that position, you can become very tight and have pain sitting or standing with good posture. Try to be aware of your posture and sit and stand up tall to heal properly.  Follow up PT: It is recommended you return every 3 months for the  first 3 years following surgery to be assessed on the SOZO machine for an L-Dex score. This helps prevent clinically significant lymphedema in 95% of patients. These follow up screens are 10 minute appointments that you are not billed for. 

## 2021-08-29 NOTE — Therapy (Signed)
[facility] @ [place], [place], [address] Phone: [phone]   Fax:  [phone]  Physical Therapy Treatment  Patient Details  Name: Leslie Wright MRN: [id number] Date of Birth: [birth year] Referring Provider (PT): Magrinat   Encounter Date: 08/29/2021   PT End of Session - 08/29/21 1540     Visit Number 2    Number of Visits 10    Date for PT Re-Evaluation 09/26/21    PT Start Time 6962    PT Stop Time 9528    PT Time Calculation (min) 27 min    Activity Tolerance Patient tolerated treatment well    Behavior During Therapy [facility] for tasks assessed/performed             Past Medical History:  Diagnosis Date   Acid reflux    Anemia    Arthritis    "maybe in my fingers" (03/26/2018)   Breast cancer ([place]) 12/2020   both sides   Concussion 2001   no deficit had tia right after no problems since   Family history of adverse reaction to anesthesia    "brother, Timmy, and my mom always get sick" (03/26/2018)   Family history of brain cancer    Family history of breast cancer    Family history of colon cancer    Family history of lung cancer    Family history of pancreatic cancer    Family history of prostate cancer    Headache    High blood pressure    High cholesterol    Hip pain    "left; before OR" (03/26/2018)   History of chemotherapy last done 02-17-2021   History of hiatal hernia    History of kidney stones    OSA (obstructive sleep apnea)    "can't tolerate the mask" (03/26/2018)    PONV (postoperative nausea and vomiting)    does not need much anesthesia, slow to awlaekn in past   Pre-diabetes    Shortness of breath    with exertion   TIA (transient ischemic attack)    15 years ago   Wears partial dentures upper    Past Surgical History:  Procedure Laterality Date   CARPAL TUNNEL RELEASE Right ~ 2006   JOINT REPLACEMENT Left    total left hip   LAPAROSCOPIC CHOLECYSTECTOMY  12/2010    LASIK Bilateral 2000   PORT A CATH REVISION Left 02/21/2021   Procedure: PORT A CATH REVISION;  Surgeon: Stark Klein, MD;  Location: [facility];  Service: General;  Laterality: Left;  60 MINUTES ROOM 5   PORTACATH PLACEMENT N/A 02/08/2021   Procedure: INSERTION PORT-A-CATH;  Surgeon: Stark Klein, MD;  Location: [facility];  Service: General;  Laterality: N/A;   RADIOACTIVE SEED GUIDED AXILLARY SENTINEL LYMPH NODE Right 08/08/2021   Procedure: RADIOACTIVE SEED GUIDED RIGHT AXILLARY SENTINEL LYMPH NODE BIOPSY;  Surgeon: Stark Klein, MD;  Location: [facility];  Service: General;  Laterality: Right;   SENTINEL NODE BIOPSY Bilateral 08/08/2021   Procedure: BILATERAL SENTINEL NODE BIOPSY;  Surgeon: Stark Klein, MD;  Location: [facility];  Service: General;  Laterality: Bilateral;   TOTAL HIP ARTHROPLASTY Left 03/25/2018   Procedure: LEFT TOTAL HIP ARTHROPLASTY ANTERIOR APPROACH;  Surgeon: Mcarthur Rossetti, MD;  Location: [facility];  Service: Orthopedics;  Laterality: Left;   TOTAL MASTECTOMY Bilateral 08/08/2021   Procedure: BILATERAL TOTAL MASTECTOMY;  Surgeon: Stark Klein, MD;  Location: [facility];  Service: General;  Laterality: Bilateral;  There were no vitals filed for this visit.   Subjective Assessment - 08/29/21 1512     Subjective I feel like my ROM is ok but I have not been doing anything. I just got my drains out yesterday. My drains were infected.    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 No/denies    Pain Score 0-No pain                [facility] PT Assessment - 08/29/21 0001       Restrictions   Weight Bearing Restrictions No      Balance Screen   Has the patient fallen in the past 6 months No    Has the  patient had a decrease in activity level because of a fear of falling?  No    Is the patient reluctant to leave their home because of a fear of falling?  No      Observation/Other Assessments   Observations Peeling skin on anterior medial chest, numerous gauze pads in place where pt reports her wounds opened and are draining, pt currently on antibiotics for infection at drain site, wounds still open bilaterally      Sit to Stand   Comments 30 sec sit to stand: 9 reps      AROM   Right Shoulder Extension 59 Degrees    Right Shoulder Flexion 135 Degrees    Right Shoulder ABduction 138 Degrees    Right Shoulder Internal Rotation 58 Degrees    Right Shoulder External Rotation 80 Degrees    Left Shoulder Extension 75 Degrees    Left Shoulder Flexion 143 Degrees    Left Shoulder ABduction 145 Degrees    Left Shoulder Internal Rotation 64 Degrees    Left Shoulder External Rotation 87 Degrees               LYMPHEDEMA/ONCOLOGY QUESTIONNAIRE - 08/29/21 0001       Right Upper Extremity Lymphedema   15 cm Proximal to Olecranon Process 40.5 cm    Olecranon Process 28.5 cm    15 cm Proximal to Ulnar Styloid Process 26.6 cm    Just Proximal to Ulnar Styloid Process 18 cm    Across Hand at [employer] 20.5 cm    At [place] of 2nd Digit 6.3 cm      Left Upper Extremity Lymphedema   15 cm Proximal to Olecranon Process 39.2 cm    Olecranon Process 27.6 cm    15 cm Proximal to Ulnar Styloid Process 26 cm    Just Proximal to Ulnar Styloid Process 17.5 cm    Across Hand at [employer] 20.4 cm    At [place] of 2nd Digit 6.5 cm                                      PT Long Term Goals - 08/29/21 1548       PT LONG TERM GOAL #1   Title Pt will return to baseline shoulder ROM measurements and not demonstrate any signs or symptoms of lymphedema    Time 6    Period Months  Status On-going      PT LONG TERM GOAL #2   Title Pt will demonstrate 153 degrees of  bilateral shoulder flexion to allow her to reach overhead    Baseline R 135 L 143    Time 4    Period Weeks    Status New    Target Date 09/26/21      PT LONG TERM GOAL #3   Title Pt will demonstrate 170 degrees of bilateral shoulder abduction to allow pt to reach out to the side    Baseline R 138 L 145    Time 4    Period Weeks    Status New    Target Date 09/26/21      PT LONG TERM GOAL #4   Title Pt will be independent in a home exercise program for long term stretching and strengthening    Time 4    Period Weeks    Status New    Target Date 09/26/21      PT LONG TERM GOAL #5   Title Pt will attend ABC class for instruction on lymphedema risk reduction practices    Time 4    Period Weeks    Status New    Target Date 09/26/21                   Plan - 08/29/21 1540     Clinical Impression Statement Pt returns to Pt after undergoing bilateral mastectomies and R SLNB (0/2) and L SLNB (0/4). Pt has completed neoadjuvant chemo. She will require radiation as well. Pt has decreased bilateral shoulder ROM following surgery. She developed an infection at her drain site and is currently on antibiotics. She had the drains removed yesterday. Educated pt to hold off on any movements above shoulder height to allow drain sites to heal. Pt has peeling skin on anterior medial chest and reports her her incisions have opened bilaterally in small areas. These areas are covered by gauze today. Educated pt to hold off on PT until the week of the 12th due to fragility of skin. Educated pt to wash her compression binder since there was wound exudate on it. Pt would benefit from skilled PT services to improve bilateral shoulder ROM, decrease pec tightness, improve scar mobility and instruct pt in a home exercise program for continued stretching and strengthening.    Rehab Potential Good    PT Frequency 2x / week    PT Duration 4 weeks    PT Treatment/Interventions ADLs/Self Care Home  Management;Patient/family education;Therapeutic exercise;Scar mobilization;Passive range of motion;Manual techniques;Manual lymph drainage;Taping;Vasopneumatic Device    PT Next Visit Plan sign pt up for ABC class, issue [facility] follow up, begin gentle PROM is wounds are healed, AAROM if wounds are healed    PT Home Exercise Plan post op breast exercises to begin in 1 week    Consulted and Agree with Plan of Care Patient             Patient will benefit from skilled therapeutic intervention in order to improve the following deficits and impairments:  Postural dysfunction, Decreased knowledge of precautions, Pain, Impaired UE functional use, Increased fascial restricitons, Decreased strength, Decreased range of motion, Decreased scar mobility  Visit Diagnosis: Stiffness of left shoulder, not elsewhere classified  Stiffness of right shoulder, not elsewhere classified  Aftercare following surgery for neoplasm  Muscle weakness (generalized)  Abnormal posture  Malignant neoplasm of lower-outer quadrant of right breast of female, estrogen receptor positive ([place])  Malignant neoplasm of lower-outer quadrant of left breast of female, estrogen receptor negative ([place])     Problem List Patient Active Problem List   Diagnosis Date Noted   Bilateral breast cancer ([place]) 08/08/2021   CKD (chronic kidney disease) stage 3, GFR 30-59 ml/min ([place]) 04/21/2021   Port-A-Cath in place 03/09/2021   Encounter for antineoplastic chemotherapy 02/17/2021   Encounter for immunotherapy 02/17/2021   Genetic testing 02/14/2021   Hepatic steatosis 02/09/2021   Multilevel degenerative disc disease 02/09/2021   Family history of breast cancer    Family history of pancreatic cancer    Family history of colon cancer    Family history of lung cancer    Family history of prostate cancer    Family history of brain cancer    Malignant neoplasm of overlapping sites of left breast in female, estrogen receptor  negative ([place]) 01/25/2021   Malignant neoplasm of lower-outer quadrant of right breast of female, estrogen receptor positive ([place]) 01/23/2021   Status post left hip replacement 05/19/2018   Acute deep vein thrombosis (DVT) of distal end of right lower extremity ([place]) 05/19/2018   Unilateral primary osteoarthritis, left hip 03/25/2018   Status post total replacement of left hip 03/25/2018   Hyperlipidemia 04/11/2017   Class 3 obesity with serious comorbidity and body mass index (BMI) of 40.0 to 44.9 in adult 04/11/2017   Prediabetes 02/04/2017   Mixed hyperlipidemia 01/09/2017   Insulin resistance 11/27/2016   Essential hypertension 10/25/2016   Morbid obesity with BMI of 40.0-44.9, adult ([place]) 10/25/2016   Vitamin D deficiency 10/25/2016    Manus Gunning, PT 08/29/2021, 3:50 PM  [facility] @ [place], [place], [address] Phone: [phone]   Fax:  [phone]  Name: BRYANNE RIQUELME MRN: [id number] Date of Birth: [birth year]   Manus Gunning, PT 08/29/21 3:50 PM

## 2021-09-05 LAB — SURGICAL PATHOLOGY

## 2021-09-06 NOTE — Progress Notes (Signed)
[place]  [phone] [phone]    ID: NIOKA THORINGTON DOB: [birth year]  MR#: [id number]  GYI#:[id number]  Patient Care Team: Fanny Bien, MD as PCP - General (Family Medicine) Herminio Commons, MD (Inactive) as PCP - Cardiology (Cardiology) Mauro Kaufmann, RN as Oncology Nurse Navigator Rockwell Germany, RN as Oncology Nurse Navigator Stark Klein, MD as Consulting Physician (General Surgery) Kyung Rudd, MD as Consulting Physician (Radiation Oncology) Chauncey Cruel, MD OTHER MD:  CHIEF COMPLAINT: Bilateral breast cancers (s/p mastectomies)  CURRENT TREATMENT: Adjuvant radiation pending   INTERVAL HISTORY: Leslie Wright returns today for follow up and treatment of her bilateral breast cancers.   Since her last visit, she underwent posttreatment breast MRI on 06/23/2021 showing: breast composition B; dominant left breast malignancy unchanged in overall extent but has now become patchy; no abnormal enhancement associated with known area of DCIS is lateral left breast; no abnormal enhancement remaining in right breast.  She proceeded to bilateral mastectomies on 08/08/2021 under Dr. Barry Dienes. Pathology from the procedure [id number]) showed: 1. BREAST, RIGHT, MASTECTOMY:  -  Focal residual invasive ductal carcinoma, status post neoadjuvant  therapy  -  Extensive sclerosing adenosis with associated calcifications  -  Sclerotic intraductal papilloma  -  Previous biopsy site changes present  -  See oncology table and comment below  2. LYMPH NODE, RIGHT AXILLARY, SENTINEL, EXCISION:  -  No carcinoma identified (0/2)  3. BREAST, LEFT, MASTECTOMY:  -  Residual multifocal invasive ductal carcinoma, status post  neoadjuvant therapy  -  Ductal carcinoma in-situ, high grade  -  Calcifications associated with carcinoma  -  Margins uninvolved by carcinoma (0.2 cm; anterior margin)  -  No carcinoma identified in one lymph node (0/1)  -   Lymphovascular space invasion present  -  Previous biopsy site changes present  -  See oncology table below  4. LYMPH NODE, LEFT AXILLARY, SENTINEL, EXCISION:  -  No carcinoma identified (0/4)   REVIEW OF SYSTEMS: Leslie Wright tolerated her surgery remarkably well.  She drove herself home on day 2 from the hospital.  She tells me her energy is coming back.  She is looking forward to starting radiation and has an appointment to discuss that 09/21/2021.  She is starting physical therapy.  She is not planning on reconstruction.  A detailed review of systems today was otherwise benign   COVID 19 VACCINATION STATUS: Status post [place] x2, no booster as of 01/25/55   HISTORY OF CURRENT ILLNESS: From the original intake note:  GERMAINE Wright" presented with a palpable left breast lump. She underwent bilateral diagnostic mammography with tomography and bilateral breast ultrasonography at [facility] on 01/12/2021 showing: breast density category C; 5.8 cm mass in the left breast at 12:30 with associated calcifications; additional 1.2 cm group of calcifications in the outer left breast;   On the right: Multiple small masses and irregular ducts in the retroareolar right breast, including a 0.8 cm representative mass at 6 o'clock; indeterminate 0.4 cm mass in upper-inner right breast; normal lymph nodes bilaterally.  Accordingly on 01/16/2021 she proceeded to biopsy of the right breast areas in question. The pathology from this procedure ([id number]) showed:  1. Right Breast, 6 o'clock  - invasive ductal carcinoma, grade 2. Prognostic indicators significant for: estrogen receptor, 90% positive with strong staining intensity and progesterone receptor, 0% negative. Proliferation marker Ki67 at 5%. HER2 equivocal by immunohistochemistry (2+), but negative by fluorescent in situ hybridization with  a signals ratio 1.61 and number per cell 2.5. 2. Right Breast, 9 o'clock  - fibrocystic change with adenosis,  calcifications, and a small intraductal papilloma.  She underwent biopsy of the left breast areas on 01/20/2021. Pathology [id number]) revealed: 1. Left Breast, upper-outer quadrant  - invasive ductal carcinoma, grade 3 2. Left Breast, central, slightly lateral to midline  - ductal carcinoma in situ with necrosis and calcifications, intermediate grade Prognostic panels are pending on both samples.   Cancer Staging  Malignant neoplasm of lower-outer quadrant of right breast of female, estrogen receptor positive ([place]) Staging form: Breast, AJCC 8th Edition - Clinical stage from 01/20/2021: Stage IA (cT1c, cN0, cM0, G2, ER+, PR-, HER2-) - Signed by Chauncey Cruel, MD on 01/25/2021 Stage prefix: Initial diagnosis Histologic grading system: 3 grade system  Malignant neoplasm of overlapping sites of left breast in female, estrogen receptor negative ([place]) Staging form: Breast, AJCC 8th Edition - Clinical stage from 01/20/2021: Stage IIIB (cT3, cN0, cM0, G3, ER-, PR-, HER2-) - Signed by Chauncey Cruel, MD on 01/25/2021 Histologic grading system: 3 grade system  The patient's subsequent history is as detailed below.   PAST MEDICAL HISTORY: Past Medical History:  Diagnosis Date   Acid reflux    Anemia    Arthritis    "maybe in my fingers" (03/26/2018)   Breast cancer ([place]) 12/2020   both sides   Concussion 2001   no deficit had tia right after no problems since   Family history of adverse reaction to anesthesia    "brother, Timmy, and my mom always get sick" (03/26/2018)   Family history of brain cancer    Family history of breast cancer    Family history of colon cancer    Family history of lung cancer    Family history of pancreatic cancer    Family history of prostate cancer    Headache    High blood pressure    High cholesterol    Hip pain    "left; before OR" (03/26/2018)   History of chemotherapy last done 02-17-2021   History of hiatal hernia    History of kidney stones     OSA (obstructive sleep apnea)    "can't tolerate the mask" (03/26/2018)    PONV (postoperative nausea and vomiting)    does not need much anesthesia, slow to awlaekn in past   Pre-diabetes    Shortness of breath    with exertion   TIA (transient ischemic attack)    15 years ago   Wears partial dentures upper    PAST SURGICAL HISTORY: Past Surgical History:  Procedure Laterality Date   CARPAL TUNNEL RELEASE Right ~ 2006   JOINT REPLACEMENT Left    total left hip   LAPAROSCOPIC CHOLECYSTECTOMY  12/2010   LASIK Bilateral 2000   PORT A CATH REVISION Left 02/21/2021   Procedure: PORT A CATH REVISION;  Surgeon: Stark Klein, MD;  Location: [facility];  Service: General;  Laterality: Left;  60 MINUTES ROOM 5   PORTACATH PLACEMENT N/A 02/08/2021   Procedure: INSERTION PORT-A-CATH;  Surgeon: Stark Klein, MD;  Location: [facility];  Service: General;  Laterality: N/A;   RADIOACTIVE SEED GUIDED AXILLARY SENTINEL LYMPH NODE Right 08/08/2021   Procedure: RADIOACTIVE SEED GUIDED RIGHT AXILLARY SENTINEL LYMPH NODE BIOPSY;  Surgeon: Stark Klein, MD;  Location: [facility];  Service: General;  Laterality: Right;   SENTINEL NODE BIOPSY Bilateral 08/08/2021   Procedure: BILATERAL SENTINEL NODE BIOPSY;  Surgeon: Stark Klein,  MD;  Location: [facility];  Service: General;  Laterality: Bilateral;   TOTAL HIP ARTHROPLASTY Left 03/25/2018   Procedure: LEFT TOTAL HIP ARTHROPLASTY ANTERIOR APPROACH;  Surgeon: Mcarthur Rossetti, MD;  Location: [facility];  Service: Orthopedics;  Laterality: Left;   TOTAL MASTECTOMY Bilateral 08/08/2021   Procedure: BILATERAL TOTAL MASTECTOMY;  Surgeon: Stark Klein, MD;  Location: [facility];  Service: General;  Laterality: Bilateral;    FAMILY HISTORY: Family History  Problem Relation Age of Onset   Diabetes Mother    Thyroid disease Mother    Liver disease Mother    Obesity Mother    Hypertension Father    Hyperlipidemia Father    Heart disease Father     Stroke Father    Obesity Father    Colon polyps Father    Prostate cancer Brother    Brain cancer Maternal Grandfather    Pancreatic cancer Paternal Grandmother    Colon cancer Neg Hx        No one in family with colon cancer.  The patient's father is 12 years old as of 01/08/21.  The patient's mother died from cirrhosis associated with hepatitis C (from a transfusion) age 76.  The patient has 3 brothers, no sisters.  1 brother has a history of prostate cancer at age 56.  A maternal grandfather had a history of central nervous system carcinoma.  A paternal grandmother had a history of pancreatic cancer and a maternal aunt had a history of breast cancer in her late 68s.   GYNECOLOGIC HISTORY:  No LMP recorded (lmp unknown). Patient is postmenopausal. Menarche: 55 years old [place] P 0 LMP 53 HRT no  Hysterectomy? no BSO? no   SOCIAL HISTORY: (updated 01/08/2021)  Rosaria Ferries "Leslie Wright" works for [employer] in [employer].  She works from home.  She is a Engineer, mining.  She describes herself as single, lives by herself, with a toy poodle and a [place] as well as 2 cats.   ADVANCED DIRECTIVES: The patient's brother Zyona Pettaway is her healthcare power of attorney.  He can be reached at [phone]   HEALTH MAINTENANCE: Social History   Tobacco Use   Smoking status: Former    Types: Cigarettes   Smokeless tobacco: Never   Tobacco comments:    In teens social  Vaping Use   Vaping Use: Never used  Substance Use Topics   Alcohol use: Yes    Comment: Occasional   Drug use: Never     Colonoscopy:   PAP: 03/2011  Bone density: scheduled for 07/2021   No Known Allergies  Current Outpatient Medications  Medication Sig Dispense Refill   acetaminophen (TYLENOL) 500 MG tablet Take 1,000 mg by mouth every 6 (six) hours as needed for moderate pain, mild pain or headache.     aspirin EC 81 MG tablet Take 81 mg by mouth daily.     azelastine (ASTELIN) 0.1 % nasal spray Use 1 - 2 sprays each  nostril 1 - 2 times a day for nasal congestion (Patient not taking: Reported on 08/09/2021) 30 mL 11   benzonatate (TESSALON) 200 MG capsule Take 1 capsule by mouth 3 times a day when needed for cough (Patient not taking: No sig reported) 60 capsule 0   betamethasone valerate ointment (VALISONE) 0.1 % Apply 1 application topically 2 (two) times daily. (Patient not taking: Reported on 08/09/2021) 30 g 0   Blood Glucose Monitoring Suppl (FREESTYLE LITE) w/Device KIT CHECK GLUCOSE 2-3 TIMES DAILY 1 kit  0   doxycycline (MONODOX) 100 MG capsule Take 1 capsule (100 mg total) by mouth 2 (two) times daily for 10 days 20 capsule 1   folic acid (FOLVITE) 1 MG tablet Take 1 mg by mouth daily. (Patient not taking: Reported on 08/09/2021)     glucose blood test strip USE AS DIRECTED TO CHECK BLOOD SUGAR 1 TO 3 TIMES A DAY (Patient taking differently: USE AS DIRECTED TO CHECK BLOOD SUGAR 1 TO 3 TIMES A DAY) 100 strip 3   ketoconazole (NIZORAL) 2 % cream Apply 1 application topically daily. (Patient not taking: Reported on 08/09/2021) 15 g 0   Lancets (FREESTYLE) lancets USE AS DIRECTED TO CHECK BLOOD SUGAR 2 TO 3 TIMES A DAY (Patient taking differently: USE AS DIRECTED TO CHECK BLOOD SUGAR 2 TO 3 TIMES A DAY) 100 each 3   losartan (COZAAR) 100 MG tablet Take 1 tablet by mouth daily (Patient not taking: No sig reported) 90 tablet 3   methocarbamol (ROBAXIN) 500 MG tablet Take 1 tablet (500 mg total) by mouth every 6 (six) hours as needed for muscle spasms. 20 tablet 1   omeprazole (PRILOSEC) 40 MG capsule Take 1 capsule (40 mg total) by mouth at bedtime. 60 capsule 0   ondansetron (ZOFRAN) 8 MG tablet Take 1 tablet (8 mg total) by mouth every 8 (eight) hours as needed for nausea or vomiting. Do not take days 1 and 2 after chemotherapy (Start day 3 after Chemotherapy.) (Patient not taking: Reported on 08/09/2021) 20 tablet 0   rosuvastatin (CRESTOR) 10 MG tablet TAKE 1 TABLET BY MOUTH ONCE A DAY AT BEDTIME FOR CHOLESTEROL  (Patient not taking: No sig reported) 90 tablet 1   traMADol (ULTRAM) 50 MG tablet Take 1 tablet (50 mg total) by mouth every 6 (six) hours as needed (mild pain). 30 tablet 1   No current facility-administered medications for this visit.    OBJECTIVE: White woman in no acute distress  Vitals:   09/07/21 1110  BP: (!) 144/95  Pulse: 79  Resp: 18  Temp: (!) 97.5 F (36.4 C)  SpO2: 100%      Body mass index is 37.76 kg/m.   Wt Readings from Last 3 Encounters:  09/07/21 241 lb 1.6 oz (109.4 kg)  08/18/21 259 lb (117.5 kg)  08/08/21 261 lb (118.4 kg)   Sclerae unicteric, EOMs intact Wearing a mask No cervical or supraclavicular adenopathy Lungs no rales or rhonchi Heart regular rate and rhythm Abd soft, nontender, positive bowel sounds MSK no focal spinal tenderness, no upper extremity lymphedema Neuro: nonfocal, well oriented, appropriate affect Breasts: Status post bilateral mastectomies.  The incisions are healing very nicely with no dehiscence erythema or swelling.  There is no evidence of chest wall recurrence or residual disease.  Both axillae are benign. Skin: She now has new psoriasis lesions in the left elbow and also in the anterior chest wall.  LAB RESULTS:  CMP     Component Value Date/Time   NA 142 08/18/2021 1403   NA 143 12/02/2018 1138   K 3.3 (L) 08/18/2021 1403   CL 107 08/18/2021 1403   CO2 23 08/18/2021 1403   GLUCOSE 120 (H) 08/18/2021 1403   BUN 13 08/18/2021 1403   BUN 17 12/02/2018 1138   CREATININE 1.18 (H) 08/18/2021 1403   CREATININE 0.92 05/04/2021 1042   CREATININE 1.09 (H) 02/18/2019 1524   CALCIUM 9.4 08/18/2021 1403   PROT 7.0 08/18/2021 1403   PROT 6.9 12/02/2018 1138  ALBUMIN 3.2 (L) 08/18/2021 1403   ALBUMIN 4.0 12/02/2018 1138   AST 16 08/18/2021 1403   AST 17 05/04/2021 1042   ALT 12 08/18/2021 1403   ALT 23 05/04/2021 1042   ALKPHOS 69 08/18/2021 1403   BILITOT <0.2 (L) 08/18/2021 1403   BILITOT 0.4 05/04/2021 1042    GFRNONAA 54 (L) 08/18/2021 1403   GFRNONAA >60 05/04/2021 1042   GFRAA 70 12/02/2018 1138    No results found for: TOTALPROTELP, ALBUMINELP, A1GS, A2GS, BETS, BETA2SER, GAMS, MSPIKE, SPEI  Lab Results  Component Value Date   WBC 8.2 09/07/2021   NEUTROABS 6.1 09/07/2021   HGB 9.1 (L) 09/07/2021   HCT 28.9 (L) 09/07/2021   MCV 89.5 09/07/2021   PLT 228 09/07/2021    No results found for: LABCA2  No components found for: NIDPOE423  No results for input(s): INR in the last 168 hours.  No results found for: LABCA2  No results found for: NTI144  No results found for: RXV400  No results found for: QQP619  No results found for: CA2729  No components found for: HGQUANT  No results found for: CEA1 / No results found for: CEA1   No results found for: AFPTUMOR  No results found for: CHROMOGRNA  No results found for: KPAFRELGTCHN, LAMBDASER, KAPLAMBRATIO (kappa/lambda light chains)  No results found for: HGBA, HGBA2QUANT, HGBFQUANT, HGBSQUAN (Hemoglobinopathy evaluation)   Lab Results  Component Value Date   LDH 283 (H) 04/23/2021    No results found for: IRON, TIBC, IRONPCTSAT (Iron and TIBC)  No results found for: FERRITIN  Urinalysis    Component Value Date/Time   COLORURINE YELLOW 04/20/2021 1505   APPEARANCEUR HAZY (A) 04/20/2021 1505   LABSPEC 1.015 04/20/2021 1505   PHURINE 5.0 04/20/2021 1505   GLUCOSEU NEGATIVE 04/20/2021 1505   HGBUR MODERATE (A) 04/20/2021 1505   BILIRUBINUR NEGATIVE 04/20/2021 1505   KETONESUR NEGATIVE 04/20/2021 1505   PROTEINUR NEGATIVE 04/20/2021 1505   UROBILINOGEN 0.2 10/23/2010 2031   NITRITE NEGATIVE 04/20/2021 1505   LEUKOCYTESUR NEGATIVE 04/20/2021 1505    STUDIES: No results found.   ELIGIBLE FOR AVAILABLE RESEARCH PROTOCOL:   ASSESSMENT: 55 y.o. [place] woman presenting with bilateral breast cancers April 2022 as follows:  (1) in the right breast, a clinical T2 N0-1, stage IIA-IIB invasive ductal carcinoma,  grade 2, estrogen receptor positive, progesterone receptor and HER2 negative, with an MIB-1 of 5%;  (a)  a second, retroareolar mass was benign but discordant; biopsy 02/07/2021  (b) borderline right axillary node not amenable to biopsy  (c) extensive patchy enhancement noted throughout the inferior right breast  (2) in the left breast, a clinical T3 N0, stage IIIB invasive ductal carcinoma, grade 3, functionally triple negative, with an MIB-1 of 25% (HER2 results are pending).  (a) secondary biopsy shows ductal carcinoma in situ associated with extensive patchy enhancement  (3) staging studies:  (a) chest CT scan 02/06/2021 shows hepatic steatosis and degenerative disc disease, no evidence of metastatic disease  (b) bone scan 02/06/2021 shows no evidence of metastatic disease  (4) neoadjuvant chemotherapy started 02/09/2021 consisting of pembrolizumab/ carboplatin/ paclitaxel stopped after 3 cycles due to peripheral neuropathy followed by pembrolizumab/ doxorubicin/ cyclophosphamide x4 started 04/13/2021, completed on 06/15/2021  (A) doxorubicin/cyclophosphamide dose reduced starting with cycle 2  (B) pembrolizumab discontinued after 09/07/2021 dose because of concerns regarding psoriasis exacerbation  (5) status post bilateral mastectomies 08/08/2021, showing:  (A) on the RIGHT, ypT1b ypN0 invasive ductal carcinoma, grade 2, with negative margins; total 2  right axillary lymph nodes removed; repeat prognostic panel shows estrogen and progesterone receptor positive, HER2 equivocal by immunohistochemistry but negative by FISH (1.07-1.60.  (B) on the LEFT, mypT3 ypN0 invasive ductal carcinoma, grade 2, with negative margins; a total of 5 left axillary lymph nodes removed; repeat prognostic panel estrogen and progesterone receptor negative, HER2 equivocal by immunohistochemistry but negative by FISH (1.15/1.90)  (C) patient is not planning on reconstruction  (6) adjuvant radiation pending  (7)  genetic testing 02/13/2021 found no deleterious mutations in AIP, ALK, APC, ATM, AXIN2,BAP1,  BARD1, BLM, BMPR1A, BRCA1, BRCA2, BRIP1, CASR, CDC73, CDH1, CDK4, CDKN1B, CDKN1C, CDKN2A (p14ARF), CDKN2A (p16INK4a), CEBPA, CHEK2, CTNNA1, DICER1, DIS3L2, EGFR (c.2369C>T, p.Thr790Met variant only), EPCAM (Deletion/duplication testing only), FH, FLCN, GATA2, GPC3, GREM1 (Promoter region deletion/duplication testing only), HOXB13 (c.251G>A, p.Gly84Glu), HRAS, KIT, MAX, MEN1, MET, MITF (c.952G>A, p.Glu318Lys variant only), MLH1, MSH2, MSH3, MSH6, MUTYH, NBN, NF1, NF2, NTHL1, PALB2, PDGFRA, PHOX2B, PMS2, POLD1, POLE, POT1, PRKAR1A, PTCH1, PTEN, RAD50, RAD51C, RAD51D, RB1, RECQL4, RET, RUNX1, SDHAF2, SDHA (sequence changes only), SDHB, SDHC, SDHD, SMAD4, SMARCA4, SMARCB1, SMARCE1, STK11, SUFU, TERC, TERT, TMEM127, TP53, TSC1, TSC2, VHL, WRN and WT1.  (A)  VUS in ATM called c.1132A>G  (8) adjuvant radiation  (9) consider adjuvant capecitabine for left-sided tumor  (10) antiestrogens for the right sided tumor  PLAN: Leslie Wright did remarkably well with her surgery.  She is not planning on reconstruction.  I went ahead and wrote her a prescription for bras and prostheses.  She understands that the right-sided breast cancer was estrogen receptor positive and will benefit from antiestrogen therapy.  The left-sided tumor which is the 1 of more concern is triple negative.  When she is done with her radiation treatment she and Dr. Sonny Dandy may discuss whether he thinks it is advisable for her to receive 6 months of capecitabine.  She already has an appointment with the radiation to discuss adjuvant treatment.  I am going to stop her pembrolizumab after today's dose because her psoriasis is coming back now that her chemotherapy has been concluded.  There are reports of psoriasis flaring up with pembrolizumab and I do not think she needs that complication at this point.  Accordingly she may have her port removed at any  point.  She will see [place] again mid February.  She knows to call for any other issue that may develop before then.  Total encounter time 25 minutes.Sarajane Jews C. Vastie Douty, MD 09/07/21 11:23 AM Medical Oncology and Hematology [facility] [place], [place] [address] Tel. [phone]    Fax. [phone]   I, Wilburn Mylar, am acting as scribe for Dr. Virgie Dad. Scorpio Fortin.  I, Lurline Del MD, have reviewed the above documentation for accuracy and completeness, and I agree with the above.    *Total Encounter Time as defined by the Centers for Medicare and Medicaid Services includes, in addition to the face-to-face time of a patient visit (documented in the note above) non-face-to-face time: obtaining and reviewing outside history, ordering and reviewing medications, tests or procedures, care coordination (communications with other health care professionals or caregivers) and documentation in the medical record.

## 2021-09-07 ENCOUNTER — Inpatient Hospital Stay: Payer: 59

## 2021-09-07 ENCOUNTER — Telehealth: Payer: Self-pay | Admitting: Oncology

## 2021-09-07 ENCOUNTER — Other Ambulatory Visit: Payer: Self-pay

## 2021-09-07 ENCOUNTER — Encounter: Payer: Self-pay | Admitting: *Deleted

## 2021-09-07 ENCOUNTER — Inpatient Hospital Stay: Payer: 59 | Admitting: Oncology

## 2021-09-07 ENCOUNTER — Inpatient Hospital Stay: Payer: 59 | Attending: Oncology

## 2021-09-07 VITALS — BP 135/84

## 2021-09-07 VITALS — BP 144/95 | HR 79 | Temp 97.5°F | Resp 18 | Ht 67.0 in | Wt 241.1 lb

## 2021-09-07 DIAGNOSIS — Z170 Estrogen receptor positive status [ER+]: Secondary | ICD-10-CM | POA: Diagnosis not present

## 2021-09-07 DIAGNOSIS — Z171 Estrogen receptor negative status [ER-]: Secondary | ICD-10-CM

## 2021-09-07 DIAGNOSIS — Z5112 Encounter for antineoplastic immunotherapy: Secondary | ICD-10-CM | POA: Diagnosis not present

## 2021-09-07 DIAGNOSIS — C50911 Malignant neoplasm of unspecified site of right female breast: Secondary | ICD-10-CM

## 2021-09-07 DIAGNOSIS — Z95828 Presence of other vascular implants and grafts: Secondary | ICD-10-CM

## 2021-09-07 DIAGNOSIS — C50511 Malignant neoplasm of lower-outer quadrant of right female breast: Secondary | ICD-10-CM

## 2021-09-07 DIAGNOSIS — C50811 Malignant neoplasm of overlapping sites of right female breast: Secondary | ICD-10-CM | POA: Insufficient documentation

## 2021-09-07 DIAGNOSIS — M539 Dorsopathy, unspecified: Secondary | ICD-10-CM

## 2021-09-07 DIAGNOSIS — K760 Fatty (change of) liver, not elsewhere classified: Secondary | ICD-10-CM

## 2021-09-07 DIAGNOSIS — C50812 Malignant neoplasm of overlapping sites of left female breast: Secondary | ICD-10-CM | POA: Diagnosis not present

## 2021-09-07 DIAGNOSIS — C50912 Malignant neoplasm of unspecified site of left female breast: Secondary | ICD-10-CM

## 2021-09-07 DIAGNOSIS — Z79899 Other long term (current) drug therapy: Secondary | ICD-10-CM | POA: Insufficient documentation

## 2021-09-07 DIAGNOSIS — L739 Follicular disorder, unspecified: Secondary | ICD-10-CM

## 2021-09-07 LAB — CBC WITH DIFFERENTIAL/PLATELET
Abs Immature Granulocytes: 0.09 10*3/uL — ABNORMAL HIGH (ref 0.00–0.07)
Basophils Absolute: 0 10*3/uL (ref 0.0–0.1)
Basophils Relative: 0 %
Eosinophils Absolute: 0.5 10*3/uL (ref 0.0–0.5)
Eosinophils Relative: 6 %
HCT: 28.9 % — ABNORMAL LOW (ref 36.0–46.0)
Hemoglobin: 9.1 g/dL — ABNORMAL LOW (ref 12.0–15.0)
Immature Granulocytes: 1 %
Lymphocytes Relative: 14 %
Lymphs Abs: 1.1 10*3/uL (ref 0.7–4.0)
MCH: 28.2 pg (ref 26.0–34.0)
MCHC: 31.5 g/dL (ref 30.0–36.0)
MCV: 89.5 fL (ref 80.0–100.0)
Monocytes Absolute: 0.5 10*3/uL (ref 0.1–1.0)
Monocytes Relative: 6 %
Neutro Abs: 6.1 10*3/uL (ref 1.7–7.7)
Neutrophils Relative %: 73 %
Platelets: 228 10*3/uL (ref 150–400)
RBC: 3.23 MIL/uL — ABNORMAL LOW (ref 3.87–5.11)
RDW: 14.6 % (ref 11.5–15.5)
WBC: 8.2 10*3/uL (ref 4.0–10.5)
nRBC: 0 % (ref 0.0–0.2)

## 2021-09-07 LAB — COMPREHENSIVE METABOLIC PANEL
ALT: 15 U/L (ref 0–44)
AST: 11 U/L — ABNORMAL LOW (ref 15–41)
Albumin: 3 g/dL — ABNORMAL LOW (ref 3.5–5.0)
Alkaline Phosphatase: 76 U/L (ref 38–126)
Anion gap: 14 (ref 5–15)
BUN: 12 mg/dL (ref 6–20)
CO2: 24 mmol/L (ref 22–32)
Calcium: 9 mg/dL (ref 8.9–10.3)
Chloride: 106 mmol/L (ref 98–111)
Creatinine, Ser: 1.14 mg/dL — ABNORMAL HIGH (ref 0.44–1.00)
GFR, Estimated: 56 mL/min — ABNORMAL LOW (ref >60)
Glucose, Bld: 116 mg/dL — ABNORMAL HIGH (ref 70–99)
Potassium: 3.1 mmol/L — ABNORMAL LOW (ref 3.5–5.1)
Sodium: 144 mmol/L (ref 135–145)
Total Bilirubin: 0.3 mg/dL (ref 0.3–1.2)
Total Protein: 6.7 g/dL (ref 6.5–8.1)

## 2021-09-07 LAB — T4, FREE: Free T4: 0.98 ng/dL (ref 0.61–1.12)

## 2021-09-07 LAB — TSH: TSH: 0.921 u[IU]/mL (ref 0.308–3.960)

## 2021-09-07 MED ORDER — HEPARIN SOD (PORK) LOCK FLUSH 100 UNIT/ML IV SOLN
500.0000 [IU] | Freq: Once | INTRAVENOUS | Status: AC | PRN
  Administered 2021-09-07: 500 [IU]

## 2021-09-07 MED ORDER — SODIUM CHLORIDE 0.9 % IV SOLN: Freq: Once | INTRAVENOUS | Status: AC

## 2021-09-07 MED ORDER — SODIUM CHLORIDE 0.9% FLUSH
10.0000 mL | INTRAVENOUS | Status: DC | PRN
  Administered 2021-09-07: 10 mL

## 2021-09-07 MED ORDER — SODIUM CHLORIDE 0.9% FLUSH
10.0000 mL | Freq: Once | INTRAVENOUS | Status: AC
  Administered 2021-09-07: 10 mL

## 2021-09-07 MED ORDER — SODIUM CHLORIDE 0.9 % IV SOLN
200.0000 mg | Freq: Once | INTRAVENOUS | Status: AC
  Administered 2021-09-07: 200 mg via INTRAVENOUS
  Filled 2021-09-07: qty 8

## 2021-09-07 NOTE — Telephone Encounter (Signed)
Scheduled appointment per 12/8 los. Patient is aware. 

## 2021-09-07 NOTE — Patient Instructions (Addendum)
[facility]  Discharge Instructions: Thank you for choosing [facility] to provide your oncology and hematology care.   If you have a lab appointment with the [facility], please go directly to the [facility] and check in at the registration area.   Wear comfortable clothing and clothing appropriate for easy access to any Portacath or PICC line.   We strive to give you quality time with your provider. You may need to reschedule your appointment if you arrive late (15 or more minutes).  Arriving late affects you and other patients whose appointments are after yours.  Also, if you miss three or more appointments without notifying the office, you may be dismissed from the clinic at the provider's discretion.      For prescription refill requests, have your pharmacy contact our office and allow 72 hours for refills to be completed.    Today you received the following chemotherapy and/or immunotherapy agent: Pembrolizumab (Keytruda).    To help prevent nausea and vomiting after your treatment, we encourage you to take your nausea medication as directed.  BELOW ARE SYMPTOMS THAT SHOULD BE REPORTED IMMEDIATELY: *FEVER GREATER THAN 100.4 F (38 C) OR HIGHER *CHILLS OR SWEATING *NAUSEA AND VOMITING THAT IS NOT CONTROLLED WITH YOUR NAUSEA MEDICATION *UNUSUAL SHORTNESS OF BREATH *UNUSUAL BRUISING OR BLEEDING *URINARY PROBLEMS (pain or burning when urinating, or frequent urination) *BOWEL PROBLEMS (unusual diarrhea, constipation, pain near the anus) TENDERNESS IN MOUTH AND THROAT WITH OR WITHOUT PRESENCE OF ULCERS (sore throat, sores in mouth, or a toothache) UNUSUAL RASH, SWELLING OR PAIN  UNUSUAL VAGINAL DISCHARGE OR ITCHING   Items with * indicate a potential emergency and should be followed up as soon as possible or go to the Emergency Department if any problems should occur.  Please show the CHEMOTHERAPY ALERT CARD or IMMUNOTHERAPY ALERT CARD at  check-in to the Emergency Department and triage nurse.  Should you have questions after your visit or need to cancel or reschedule your appointment, please contact [facility]  Dept: [phone]  and follow the prompts.  Office hours are 8:00 a.m. to 4:30 p.m. Monday - Friday. Please note that voicemails left after 4:00 p.m. may not be returned until the following business day.  We are closed weekends and major holidays. You have access to a nurse at all times for urgent questions. Please call the main number to the clinic Dept: [phone] and follow the prompts.   For any non-urgent questions, you may also contact your provider using MyChart. We now offer e-Visits for anyone 78 and older to request care online for non-urgent symptoms. For details visit [email or web address].   Also download the MyChart app! Go to the app store, search "MyChart", open the app, select [facility], and log in with your MyChart username and password.  Due to Covid, a mask is required upon entering the hospital/clinic. If you do not have a mask, one will be given to you upon arrival. For doctor visits, patients may have 1 support person aged 40 or older with them. For treatment visits, patients cannot have anyone with them due to current Covid guidelines and our immunocompromised population.   Hypokalemia Hypokalemia means that the amount of potassium in the blood is lower than normal. Potassium is a chemical (electrolyte) that helps regulate the amount of fluid in the body. It also stimulates muscle tightening (contraction) and helps nerves work properly. Normally, most of the body's potassium is inside cells,  and only a very small amount is in the blood. Because the amount in the blood is so small, minor changes to potassium levels in the blood can be life-threatening. What are the causes? This condition may be caused by: Antibiotic medicine. Diarrhea or vomiting. Taking too much of  a medicine that helps you have a bowel movement (laxative) can cause diarrhea and lead to hypokalemia. Chronic kidney disease (CKD). Medicines that help the body get rid of excess fluid (diuretics). Eating disorders, such as bulimia. Low magnesium levels in the body. Sweating a lot. What are the signs or symptoms? Symptoms of this condition include: Weakness. Constipation. Fatigue. Muscle cramps. Mental confusion. Skipped heartbeats or irregular heartbeat (palpitations). Tingling or numbness. How is this diagnosed? This condition is diagnosed with a blood test. How is this treated? This condition may be treated by: Taking potassium supplements by mouth. Adjusting the medicines that you take. Eating more foods that contain a lot of potassium. If your potassium level is very low, you may need to get potassium through an IV and be monitored in the hospital. Follow these instructions at home:  Take over-the-counter and prescription medicines only as told by your health care provider. This includes vitamins and supplements. Eat a healthy diet. A healthy diet includes fresh fruits and vegetables, whole grains, healthy fats, and lean proteins. If instructed, eat more foods that contain a lot of potassium. This includes: Nuts, such as peanuts and pistachios. Seeds, such as sunflower seeds and pumpkin seeds. Peas, lentils, and lima beans. Whole grain and bran cereals and breads. Fresh fruits and vegetables, such as apricots, avocado, bananas, cantaloupe, kiwi, oranges, tomatoes, asparagus, and potatoes. Orange juice. Tomato juice. Red meats. Yogurt. Keep all follow-up visits as told by your health care provider. This is important. Contact a health care provider if you: Have weakness that gets worse. Feel your heart pounding or racing. Vomit. Have diarrhea. Have diabetes (diabetes mellitus) and you have trouble keeping your blood sugar (glucose) in your target range. Get help right  away if you: Have chest pain. Have shortness of breath. Have vomiting or diarrhea that lasts for more than 2 days. Faint. Summary Hypokalemia means that the amount of potassium in the blood is lower than normal. This condition is diagnosed with a blood test. Hypokalemia may be treated by taking potassium supplements, adjusting the medicines that you take, or eating more foods that are high in potassium. If your potassium level is very low, you may need to get potassium through an IV and be monitored in the hospital. This information is not intended to replace advice given to you by your health care provider. Make sure you discuss any questions you have with your health care provider. Document Revised: 04/29/2018 Document Reviewed: 04/30/2018 Elsevier Patient Education  [place].

## 2021-09-11 ENCOUNTER — Other Ambulatory Visit: Payer: Self-pay | Admitting: General Surgery

## 2021-09-13 ENCOUNTER — Ambulatory Visit: Payer: 59 | Admitting: Physical Therapy

## 2021-09-13 DIAGNOSIS — Z760 Encounter for issue of repeat prescription: Secondary | ICD-10-CM | POA: Diagnosis not present

## 2021-09-18 ENCOUNTER — Ambulatory Visit: Payer: 59 | Attending: General Surgery | Admitting: Physical Therapy

## 2021-09-18 ENCOUNTER — Encounter: Payer: Self-pay | Admitting: Physical Therapy

## 2021-09-18 ENCOUNTER — Other Ambulatory Visit: Payer: Self-pay

## 2021-09-18 DIAGNOSIS — C50511 Malignant neoplasm of lower-outer quadrant of right female breast: Secondary | ICD-10-CM | POA: Insufficient documentation

## 2021-09-18 DIAGNOSIS — Z171 Estrogen receptor negative status [ER-]: Secondary | ICD-10-CM

## 2021-09-18 DIAGNOSIS — Z483 Aftercare following surgery for neoplasm: Secondary | ICD-10-CM

## 2021-09-18 DIAGNOSIS — M25612 Stiffness of left shoulder, not elsewhere classified: Secondary | ICD-10-CM | POA: Diagnosis not present

## 2021-09-18 DIAGNOSIS — M25611 Stiffness of right shoulder, not elsewhere classified: Secondary | ICD-10-CM

## 2021-09-18 DIAGNOSIS — Z170 Estrogen receptor positive status [ER+]: Secondary | ICD-10-CM

## 2021-09-18 DIAGNOSIS — R293 Abnormal posture: Secondary | ICD-10-CM

## 2021-09-18 DIAGNOSIS — M6281 Muscle weakness (generalized): Secondary | ICD-10-CM | POA: Diagnosis not present

## 2021-09-18 DIAGNOSIS — C50512 Malignant neoplasm of lower-outer quadrant of left female breast: Secondary | ICD-10-CM | POA: Insufficient documentation

## 2021-09-18 NOTE — Therapy (Signed)
[facility] @ [address] [place], [place], [address] Phone: [phone]   Fax:  [phone]  Physical Therapy Treatment  Patient Details  Name: Leslie Wright MRN: [id number] Date of Birth: 18-Feb-1966 Referring Provider (PT): Magrinat   Encounter Date: 09/18/2021   PT End of Session - 09/18/21 1556     Visit Number 3    Number of Visits 10    Date for PT Re-Evaluation 09/26/21    PT Start Time 6222    PT Stop Time 1550    PT Time Calculation (min) 52 min    Activity Tolerance Patient tolerated treatment well    Behavior During Therapy [facility] for tasks assessed/performed             Past Medical History:  Diagnosis Date   Acid reflux    Anemia    Arthritis    "maybe in my fingers" (03/26/2018)   Breast cancer ([facility]) 12/2020   both sides   Concussion 2001   no deficit had tia right after no problems since   Family history of adverse reaction to anesthesia    "brother, Timmy, and my mom always get sick" (03/26/2018)   Family history of brain cancer    Family history of breast cancer    Family history of colon cancer    Family history of lung cancer    Family history of pancreatic cancer    Family history of prostate cancer    Headache    High blood pressure    High cholesterol    Hip pain    "left; before OR" (03/26/2018)   History of chemotherapy last done 02-17-2021   History of hiatal hernia    History of kidney stones    OSA (obstructive sleep apnea)    "can't tolerate the mask" (03/26/2018)    PONV (postoperative nausea and vomiting)    does not need much anesthesia, slow to awlaekn in past   Pre-diabetes    Shortness of breath    with exertion   TIA (transient ischemic attack)    15 years ago   Wears partial dentures upper    Past Surgical History:  Procedure Laterality Date   CARPAL TUNNEL RELEASE Right ~ 2006   JOINT REPLACEMENT Left    total left hip   LAPAROSCOPIC CHOLECYSTECTOMY  12/2010    LASIK Bilateral 2000   PORT A CATH REVISION Left 02/21/2021   Procedure: PORT A CATH REVISION;  Surgeon: Stark Klein, MD;  Location: [facility];  Service: General;  Laterality: Left;  60 MINUTES ROOM 5   PORTACATH PLACEMENT N/A 02/08/2021   Procedure: INSERTION PORT-A-CATH;  Surgeon: Stark Klein, MD;  Location: [facility];  Service: General;  Laterality: N/A;   RADIOACTIVE SEED GUIDED AXILLARY SENTINEL LYMPH NODE Right 08/08/2021   Procedure: RADIOACTIVE SEED GUIDED RIGHT AXILLARY SENTINEL LYMPH NODE BIOPSY;  Surgeon: Stark Klein, MD;  Location: [facility];  Service: General;  Laterality: Right;   SENTINEL NODE BIOPSY Bilateral 08/08/2021   Procedure: BILATERAL SENTINEL NODE BIOPSY;  Surgeon: Stark Klein, MD;  Location: [facility];  Service: General;  Laterality: Bilateral;   TOTAL HIP ARTHROPLASTY Left 03/25/2018   Procedure: LEFT TOTAL HIP ARTHROPLASTY ANTERIOR APPROACH;  Surgeon: Mcarthur Rossetti, MD;  Location: [facility];  Service: Orthopedics;  Laterality: Left;   TOTAL MASTECTOMY Bilateral 08/08/2021   Procedure: BILATERAL TOTAL MASTECTOMY;  Surgeon: Stark Klein, MD;  Location: [facility];  Service: General;  Laterality: Bilateral;  There were no vitals filed for this visit.   Subjective Assessment - 09/18/21 1459     Subjective The healing is going good. She had to drain some Friday. Most of it is closed. I see radiation on Thursday.    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 No/denies    Pain Score 0-No pain                [facility] PT Assessment - 09/18/21 0001       Observation/Other Assessments   Observations peeling skin on anterior chest is healing, L mastectomy scar has healed, R still has dime size  opening at T junction with beefy red tissue and no exudate      AROM   Right Shoulder Extension 56 Degrees    Right Shoulder Flexion 155 Degrees    Right Shoulder ABduction 145 Degrees    Left Shoulder Flexion 160 Degrees    Left Shoulder ABduction 169 Degrees               LYMPHEDEMA/ONCOLOGY QUESTIONNAIRE - 09/18/21 0001       Surgeries   Number Lymph Nodes Removed 4   4 on L, 2 on R                       [facility] Adult PT Treatment/Exercise - 09/18/21 0001       Shoulder Exercises: Pulleys   Flexion 2 minutes   educated pt to stop when she feels pulling on R side to avoid pulling T junction   ABduction 2 minutes   educated pt to stop when she feels pulling on R to avoid pulling T junction which is healing     Shoulder Exercises: Therapy Ball   Flexion 10 reps;Both   with gentle stretch at end range   ABduction Both;10 reps   with gentle stretch at end range     Manual Therapy   Manual Therapy Soft tissue mobilization;Passive ROM    Soft tissue mobilization gently along healed areas of R mastectomy scar and to R pec    Passive ROM to R shoulder in direction of flexion, abduction and ER while monitoring pt's T junction throughout to avoid pulling healing wound                          PT Long Term Goals - 08/29/21 1548       PT LONG TERM GOAL #1   Title Pt will return to baseline shoulder ROM measurements and not demonstrate any signs or symptoms of lymphedema    Time 6    Period Months    Status On-going      PT LONG TERM GOAL #2   Title Pt will demonstrate 153 degrees of bilateral shoulder flexion to allow her to reach overhead    Baseline R 135 L 143    Time 4    Period Weeks    Status New    Target Date 09/26/21      PT LONG TERM GOAL #3   Title Pt will demonstrate 170 degrees of bilateral shoulder abduction to allow pt to reach out to the side  Baseline R 138 L 145    Time 4    Period Weeks    Status New    Target Date  09/26/21      PT LONG TERM GOAL #4   Title Pt will be independent in a home exercise program for long term stretching and strengthening    Time 4    Period Weeks    Status New    Target Date 09/26/21      PT LONG TERM GOAL #5   Title Pt will attend ABC class for instruction on lymphedema risk reduction practices    Time 4    Period Weeks    Status New    Target Date 09/26/21                   Plan - 09/18/21 1557     Clinical Impression Statement Pt's mastectomy scar on the L has healed completely and the R has mostly healed but there is still a dime sized opnening at T junction. Payed careful attention not to pull on healing wound on R side during manual therapy and AAROM exercises. Pt's ROM has improved since her evaluation. She reports she forgot to do her exercises since last session. She recently had fluid drained from the R side. Began pulleys and ball today with pt demonstrating full ROM. Performed PROM to R shoulder in all directions to decrease tightness.    PT Frequency 2x / week    PT Duration 4 weeks    PT Treatment/Interventions ADLs/Self Care Home Management;Patient/family education;Therapeutic exercise;Scar mobilization;Passive range of motion;Manual techniques;Manual lymph drainage;Taping;Vasopneumatic Device    PT Next Visit Plan sign pt up for ABC class, issue [facility] follow up, begin gentle PROM is wounds are healed, AAROM if wounds are healed    PT Home Exercise Plan post op breast exercises to begin in 1 week    Consulted and Agree with Plan of Care Patient             Patient will benefit from skilled therapeutic intervention in order to improve the following deficits and impairments:  Postural dysfunction, Decreased knowledge of precautions, Pain, Impaired UE functional use, Increased fascial restricitons, Decreased strength, Decreased range of motion, Decreased scar mobility  Visit Diagnosis: Stiffness of left shoulder, not elsewhere  classified  Stiffness of right shoulder, not elsewhere classified  Aftercare following surgery for neoplasm  Muscle weakness (generalized)  Abnormal posture  Malignant neoplasm of lower-outer quadrant of right breast of female, estrogen receptor positive ([facility])  Malignant neoplasm of lower-outer quadrant of left breast of female, estrogen receptor negative ([place])     Problem List Patient Active Problem List   Diagnosis Date Noted   Bilateral breast cancer ([facility]) 08/08/2021   CKD (chronic kidney disease) stage 3, GFR 30-59 ml/min ([facility]) 04/21/2021   Port-A-Cath in place 03/09/2021   Encounter for antineoplastic chemotherapy 02/17/2021   Encounter for immunotherapy 02/17/2021   Genetic testing 02/14/2021   Hepatic steatosis 02/09/2021   Multilevel degenerative disc disease 02/09/2021   Family history of breast cancer    Family history of pancreatic cancer    Family history of colon cancer    Family history of lung cancer    Family history of prostate cancer    Family history of brain cancer    Malignant neoplasm of overlapping sites of left breast in female, estrogen receptor negative ([facility]) 01/25/2021   Malignant neoplasm of lower-outer quadrant of right breast of female, estrogen receptor positive ([place]) 01/23/2021  Status post left hip replacement 05/19/2018   Acute deep vein thrombosis (DVT) of distal end of right lower extremity ([facility]) 05/19/2018   Unilateral primary osteoarthritis, left hip 03/25/2018   Status post total replacement of left hip 03/25/2018   Hyperlipidemia 04/11/2017   Class 3 obesity with serious comorbidity and body mass index (BMI) of 40.0 to 44.9 in adult 04/11/2017   Prediabetes 02/04/2017   Mixed hyperlipidemia 01/09/2017   Insulin resistance 11/27/2016   Essential hypertension 10/25/2016   Morbid obesity with BMI of 40.0-44.9, adult ([place]) 10/25/2016   Vitamin D deficiency 10/25/2016    Manus Gunning, PT 09/18/2021, 4:00 PM  [facility] @ [address] [place], [place], [address] Phone: [phone]   Fax:  [phone]  Name: Leslie Wright MRN: [id number] Date of Birth: Nov 30, 1965  Manus Gunning, PT 09/18/21 4:00 PM

## 2021-09-20 ENCOUNTER — Telehealth: Payer: Self-pay

## 2021-09-20 ENCOUNTER — Encounter: Payer: 59 | Admitting: Physical Therapy

## 2021-09-20 NOTE — Telephone Encounter (Signed)
Spoke w/ patient, identified with 2 identifiers and gave a friendly reminder of her 1:30pm-09/21/21 in-person consult w/ Shona Simpson PA-C. I advised patient to arrive 15 min early for check-in. I left my extension [phone] for patient to call if needed. Patient verbalized understanding of information given.

## 2021-09-21 ENCOUNTER — Other Ambulatory Visit: Payer: Self-pay

## 2021-09-21 ENCOUNTER — Ambulatory Visit
Admission: RE | Admit: 2021-09-21 | Discharge: 2021-09-21 | Disposition: A | Discharge status: Home / Self Care | Payer: 59 | Source: Ambulatory Visit | Attending: Radiation Oncology | Admitting: Radiation Oncology

## 2021-09-21 ENCOUNTER — Encounter: Payer: Self-pay | Admitting: Radiation Oncology

## 2021-09-21 ENCOUNTER — Other Ambulatory Visit (HOSPITAL_COMMUNITY): Payer: Self-pay

## 2021-09-21 VITALS — BP 158/89 | HR 102 | Temp 96.9°F | Resp 18 | Ht 67.0 in | Wt 250.2 lb

## 2021-09-21 DIAGNOSIS — Z803 Family history of malignant neoplasm of breast: Secondary | ICD-10-CM | POA: Insufficient documentation

## 2021-09-21 DIAGNOSIS — Z87442 Personal history of urinary calculi: Secondary | ICD-10-CM | POA: Diagnosis not present

## 2021-09-21 DIAGNOSIS — I10 Essential (primary) hypertension: Secondary | ICD-10-CM | POA: Diagnosis not present

## 2021-09-21 DIAGNOSIS — Z170 Estrogen receptor positive status [ER+]: Secondary | ICD-10-CM | POA: Insufficient documentation

## 2021-09-21 DIAGNOSIS — C50412 Malignant neoplasm of upper-outer quadrant of left female breast: Secondary | ICD-10-CM | POA: Diagnosis not present

## 2021-09-21 DIAGNOSIS — E7800 Pure hypercholesterolemia, unspecified: Secondary | ICD-10-CM | POA: Diagnosis not present

## 2021-09-21 DIAGNOSIS — G4730 Sleep apnea, unspecified: Secondary | ICD-10-CM | POA: Diagnosis not present

## 2021-09-21 DIAGNOSIS — Z9221 Personal history of antineoplastic chemotherapy: Secondary | ICD-10-CM | POA: Insufficient documentation

## 2021-09-21 DIAGNOSIS — L304 Erythema intertrigo: Secondary | ICD-10-CM | POA: Insufficient documentation

## 2021-09-21 DIAGNOSIS — Z171 Estrogen receptor negative status [ER-]: Secondary | ICD-10-CM | POA: Insufficient documentation

## 2021-09-21 DIAGNOSIS — Z87891 Personal history of nicotine dependence: Secondary | ICD-10-CM | POA: Diagnosis not present

## 2021-09-21 DIAGNOSIS — Z8673 Personal history of transient ischemic attack (TIA), and cerebral infarction without residual deficits: Secondary | ICD-10-CM | POA: Diagnosis not present

## 2021-09-21 DIAGNOSIS — K219 Gastro-esophageal reflux disease without esophagitis: Secondary | ICD-10-CM | POA: Insufficient documentation

## 2021-09-21 DIAGNOSIS — C50812 Malignant neoplasm of overlapping sites of left female breast: Secondary | ICD-10-CM | POA: Insufficient documentation

## 2021-09-21 DIAGNOSIS — Z7982 Long term (current) use of aspirin: Secondary | ICD-10-CM | POA: Insufficient documentation

## 2021-09-21 DIAGNOSIS — Z801 Family history of malignant neoplasm of trachea, bronchus and lung: Secondary | ICD-10-CM | POA: Diagnosis not present

## 2021-09-21 DIAGNOSIS — Z800 Family history of malignant neoplasm of digestive organs: Secondary | ICD-10-CM | POA: Diagnosis not present

## 2021-09-21 DIAGNOSIS — C50511 Malignant neoplasm of lower-outer quadrant of right female breast: Secondary | ICD-10-CM | POA: Insufficient documentation

## 2021-09-21 MED ORDER — FLUCONAZOLE 150 MG PO TABS
ORAL_TABLET | ORAL | 0 refills | Status: DC
  Filled 2021-09-21: qty 4, 28d supply, fill #0

## 2021-09-21 NOTE — Progress Notes (Signed)
Patient reports red, irritating, rash under bilateral breasts. No other symptoms reported at this time.  Meaningful use complete.  Postmenopausal- NO chances of pregnancy.  BP (!) 158/89 (BP Location: Right Leg, Patient Position: Sitting)    Pulse (!) 102    Temp (!) 96.9 F (36.1 C) (Temporal)    Resp 18    Ht 5\' 7"  (1.702 m)    Wt 250 lb 4 oz (113.5 kg)    LMP  (LMP Unknown)    SpO2 99%    BMI 39.19 kg/m

## 2021-09-21 NOTE — Progress Notes (Signed)
Radiation Oncology         [phone] ________________________________  Name: [name]        MRN: [id number]  Date of Service: 09/21/2021 DOB: [birth year]  EX:HBZJI, Mechele Claude, MD  Magrinat, Virgie Dad, MD     REFERRING PHYSICIAN: Magrinat, Virgie Dad, MD   DIAGNOSIS: The encounter diagnosis was Malignant neoplasm of lower-outer quadrant of right breast of female, estrogen receptor positive ([place]).   HISTORY OF PRESENT ILLNESS: Leslie Wright is a 55 y.o. female with a diagnosis of bilateral breast cancer.  The patient was seen after finding a palpable lump in the left breast.  Diagnostic mammography revealed a suspected spiculated mass with associated calcifications in the left breast the area measuring up to 1.2 cm in the upper outer quadrant, there was also distortion in the inferior right breast spanning 5.9 cm.  Further diagnostic imaging with ultrasound revealed a mass at the 1230 o'clock position in the left breast measuring up to 5.8 cm, her axilla on the left was negative for adenopathy.  The right breast also by ultrasound showed a mass measuring 5 mm in the 1 o'clock position and in the inferior retroareolar aspect of the breast there were multiple small hypoechoic masses with dilated ducts the largest area is at the 6 o'clock position measuring 8 mm.  The axilla on the right did not show any evidence of adenopathy.  She underwent a biopsy of the right breast on 01/16/2021.  The specimen from the 6 o'clock position revealed a grade 2 invasive ductal carcinoma, and her tumor was ER positive PR negative, HER2 negative with a Ki-67 of 5%.  The second biopsy in the right breast showed fibrocystic changes at the 9 o'clock position with associated adenosis, calcifications and a small intraductal papilloma negative for carcinoma.  She returned for biopsy of the left breast on 01/20/2021, and this revealed invasive ductal carcinoma, grade 3 in the upper outer quadrant and a second specimen in  the central slightly lateral to the midline breast which revealed intermediate grade ductal carcinoma in situ with necrosis and calcifications.  Her cancer was ER weakly positive at 5% staining intensity, PR negative, HER2 negative  Her Ki-67 is 25%.   MRI on 02/01/2021 showed a 5.8 cm mass in the upper outer quadrant of the left breast correlating to the biopsy-proven left breast cancer as well as a 2.1 cm enhancing mass in the right breast at 6:00 with her known diagnosis of cancer.  There was patchy nodular enhancement throughout the lower portions of bilateral breasts.  A small mass in the upper inner quadrant of the right breast was also seen.  It was recommended that this undergo biopsy.  A second look ultrasound with possible biopsy of one of the right axillary lymph nodes was also recommended as there was concern for a single abnormal node measuring 1 cm  She did undergo this on 02/07/2021 but a biopsy couldn't be performed due to her anatomy, and biopsy of the node in question was recommended at the time of her surgery.  The second biopsy in the right breast finding a following her MRI also showed a complex sclerosing lesion with sampling on 02/07/2021.  She received neoadjuvant chemotherapy between 02/10/2021 and she completed her chemotherapy on 06/17/2021 she continues with single agent Keytruda and her last infusion was on 09/07/2021.  She underwent bilateral mastectomies with sentinel lymph node biopsy on the right on 08/08/2021.  Final pathology revealed 2 lymph nodes that  were negative in the right axilla.  Her right mastectomy specimen showed focal residual invasive ductal carcinoma status post neoadjuvant therapy with extensive sclerosing adenosis and associated calcifications.  Sclerotic intraductal papilloma and previous biopsy findings were noted.  The left breast specimen showed residual multifocal invasive ductal carcinoma status post therapy with associated high-grade DCIS and calcifications  measuring up to 7 cm.  Her margins were uninvolved but the closest margin was 2 mm to the anterior margin.  A single lymph node was within the specimen and was negative for disease.  Lymphovascular space invasion was noted.  4 other lymph nodes called out specifically and sentinel lymph node biopsy were all negative for disease.  She is seen today to discuss postmastectomy radiotherapy to the left chest wall and regional nodes.  PREVIOUS RADIATION THERAPY: No   PAST MEDICAL HISTORY:  Past Medical History:  Diagnosis Date   Acid reflux    Anemia    Arthritis    "maybe in my fingers" (03/26/2018)   Breast cancer ([place]) 12/2020   both sides   Concussion 2001   no deficit had tia right after no problems since   Family history of adverse reaction to anesthesia    "brother, Timmy, and my mom always get sick" (03/26/2018)   Family history of brain cancer    Family history of breast cancer    Family history of colon cancer    Family history of lung cancer    Family history of pancreatic cancer    Family history of prostate cancer    Headache    High blood pressure    High cholesterol    Hip pain    "left; before OR" (03/26/2018)   History of chemotherapy last done 02-17-2021   History of hiatal hernia    History of kidney stones    OSA (obstructive sleep apnea)    "can't tolerate the mask" (03/26/2018)    PONV (postoperative nausea and vomiting)    does not need much anesthesia, slow to awlaekn in past   Pre-diabetes    Shortness of breath    with exertion   TIA (transient ischemic attack)    15 years ago   Wears partial dentures upper       PAST SURGICAL HISTORY: Past Surgical History:  Procedure Laterality Date   CARPAL TUNNEL RELEASE Right ~ 2006   JOINT REPLACEMENT Left    total left hip   LAPAROSCOPIC CHOLECYSTECTOMY  12/2010   LASIK Bilateral 2000   PORT A CATH REVISION Left 02/21/2021   Procedure: PORT A CATH REVISION;  Surgeon: Stark Klein, MD;  Location: [facility];  Service: General;  Laterality: Left;  60 MINUTES ROOM 5   PORTACATH PLACEMENT N/A 02/08/2021   Procedure: INSERTION PORT-A-CATH;  Surgeon: Stark Klein, MD;  [facility];  Service: General;  Laterality: N/A;   RADIOACTIVE SEED GUIDED AXILLARY SENTINEL LYMPH NODE Right 08/08/2021   Procedure: RADIOACTIVE SEED GUIDED RIGHT AXILLARY SENTINEL LYMPH NODE BIOPSY;  Surgeon: Stark Klein, MD;  Location: [facility];  Service: General;  Laterality: Right;   SENTINEL NODE BIOPSY Bilateral 08/08/2021   Procedure: BILATERAL SENTINEL NODE BIOPSY;  Surgeon: Stark Klein, MD;  Location: [facility];  Service: General;  Laterality: Bilateral;   TOTAL HIP ARTHROPLASTY Left 03/25/2018   Procedure: LEFT TOTAL HIP ARTHROPLASTY ANTERIOR APPROACH;  Surgeon: Mcarthur Rossetti, MD;  Location: [facility];  Service: Orthopedics;  Laterality: Left;   TOTAL MASTECTOMY Bilateral 08/08/2021   Procedure: BILATERAL  TOTAL MASTECTOMY;  Surgeon: Stark Klein, MD;  Location: [facility];  Service: General;  Laterality: Bilateral;     FAMILY HISTORY:  Family History  Problem Relation Age of Onset   Diabetes Mother    Thyroid disease Mother    Liver disease Mother    Obesity Mother    Hypertension Father    Hyperlipidemia Father    Heart disease Father    Stroke Father    Obesity Father    Colon polyps Father    Prostate cancer Brother    Brain cancer Maternal Grandfather    Pancreatic cancer Paternal Grandmother    Colon cancer Neg Hx        No one in family with colon cancer.     SOCIAL HISTORY:  reports that she has quit smoking. Her smoking use included cigarettes. She has never used smokeless tobacco. She reports current alcohol use. She reports that she does not use drugs. The patient is single. She lives in [place], [place], but works for [employer]. She has been working remotely during the pandemic.      ALLERGIES: Patient has no known allergies.   MEDICATIONS:  Current Outpatient Medications   Medication Sig Dispense Refill   acetaminophen (TYLENOL) 500 MG tablet Take 1,000 mg by mouth every 6 (six) hours as needed for moderate pain, mild pain or headache.     aspirin EC 81 MG tablet Take 81 mg by mouth daily.     azelastine (ASTELIN) 0.1 % nasal spray Use 1 - 2 sprays each nostril 1 - 2 times a day for nasal congestion (Patient not taking: Reported on 08/09/2021) 30 mL 11   benzonatate (TESSALON) 200 MG capsule Take 1 capsule by mouth 3 times a day when needed for cough (Patient not taking: No sig reported) 60 capsule 0   betamethasone valerate ointment (VALISONE) 0.1 % Apply 1 application topically 2 (two) times daily. (Patient not taking: Reported on 08/09/2021) 30 g 0   Blood Glucose Monitoring Suppl (FREESTYLE LITE) w/Device KIT CHECK GLUCOSE 2-3 TIMES DAILY 1 kit 0   doxycycline (MONODOX) 100 MG capsule Take 1 capsule (100 mg total) by mouth 2 (two) times daily for 10 days 20 capsule 1   folic acid (FOLVITE) 1 MG tablet Take 1 mg by mouth daily. (Patient not taking: Reported on 08/09/2021)     glucose blood test strip USE AS DIRECTED TO CHECK BLOOD SUGAR 1 TO 3 TIMES A DAY (Patient taking differently: USE AS DIRECTED TO CHECK BLOOD SUGAR 1 TO 3 TIMES A DAY) 100 strip 3   ketoconazole (NIZORAL) 2 % cream Apply 1 application topically daily. (Patient not taking: Reported on 08/09/2021) 15 g 0   Lancets (FREESTYLE) lancets USE AS DIRECTED TO CHECK BLOOD SUGAR 2 TO 3 TIMES A DAY (Patient taking differently: USE AS DIRECTED TO CHECK BLOOD SUGAR 2 TO 3 TIMES A DAY) 100 each 3   losartan (COZAAR) 100 MG tablet Take 1 tablet by mouth daily (Patient not taking: No sig reported) 90 tablet 3   methocarbamol (ROBAXIN) 500 MG tablet Take 1 tablet (500 mg total) by mouth every 6 (six) hours as needed for muscle spasms. 20 tablet 1   omeprazole (PRILOSEC) 40 MG capsule Take 1 capsule (40 mg total) by mouth at bedtime. 60 capsule 0   ondansetron (ZOFRAN) 8 MG tablet Take 1 tablet (8 mg total) by mouth  every 8 (eight) hours as needed for nausea or vomiting. Do not take days 1  and 2 after chemotherapy (Start day 3 after Chemotherapy.) (Patient not taking: Reported on 08/09/2021) 20 tablet 0   rosuvastatin (CRESTOR) 10 MG tablet TAKE 1 TABLET BY MOUTH ONCE A DAY AT BEDTIME FOR CHOLESTEROL (Patient not taking: No sig reported) 90 tablet 1   traMADol (ULTRAM) 50 MG tablet Take 1 tablet (50 mg total) by mouth every 6 (six) hours as needed (mild pain). 30 tablet 1   No current facility-administered medications for this encounter.     REVIEW OF SYSTEMS: On review of systems, the patient reports that she is doing well overall. She is tired from all of her treatment but has done very well overall. She reports she has noticed some erythema along her inferior incision sites but is concerned this is likely her Psoriasis. No other complaints are verbalized.      PHYSICAL EXAM:  Wt Readings from Last 3 Encounters:  09/07/21 241 lb 1.6 oz (109.4 kg)  08/18/21 259 lb (117.5 kg)  08/08/21 261 lb (118.4 kg)   Temp Readings from Last 3 Encounters:  09/07/21 (!) 97.5 F (36.4 C) (Temporal)  08/18/21 98.2 F (36.8 C) (Oral)  08/09/21 97.6 F (36.4 C) (Oral)   BP Readings from Last 3 Encounters:  09/07/21 135/84  09/07/21 (!) 144/95  08/18/21 138/72   Pulse Readings from Last 3 Encounters:  09/07/21 79  08/18/21 69  08/09/21 64    In general this is a well appearing caucasian female in no acute distress. She's alert and oriented x4 and appropriate throughout the examination. Cardiopulmonary assessment is negative for acute distress and she exhibits normal effort.  Well-healed bilateral mastectomy scar lines are identified without separation bleeding or drainage. Below the scars in what anatomically would be the inframammary folds and extending to the medial chest along the sternum are changes all contiguous with satellite plaques. The changes are favored to be Intertrigo.   ECOG = 0  0 -  Asymptomatic (Fully active, able to carry on all predisease activities without restriction)  1 - Symptomatic but completely ambulatory (Restricted in physically strenuous activity but ambulatory and able to carry out work of a light or sedentary nature. For example, light housework, office work)  2 - Symptomatic, <50% in bed during the day (Ambulatory and capable of all self care but unable to carry out any work activities. Up and about more than 50% of waking hours)  3 - Symptomatic, >50% in bed, but not bedbound (Capable of only limited self-care, confined to bed or chair 50% or more of waking hours)  4 - Bedbound (Completely disabled. Cannot carry on any self-care. Totally confined to bed or chair)  5 - Death   Eustace Pen MM, Creech RH, Tormey DC, et al. [phone]). "Toxicity and response criteria of the [facility]". [place] Oncol. 5 (6): 649-55    LABORATORY DATA:  Lab Results  Component Value Date   WBC 8.2 09/07/2021   HGB 9.1 (L) 09/07/2021   HCT 28.9 (L) 09/07/2021   MCV 89.5 09/07/2021   PLT 228 09/07/2021   Lab Results  Component Value Date   NA 144 09/07/2021   K 3.1 (L) 09/07/2021   CL 106 09/07/2021   CO2 24 09/07/2021   Lab Results  Component Value Date   ALT 15 09/07/2021   AST 11 (L) 09/07/2021   ALKPHOS 76 09/07/2021   BILITOT 0.3 09/07/2021      RADIOGRAPHY: No results found.      IMPRESSION/PLAN: 1. Stage  IA, cT1cN0-1M0 grade 2 ER positive invasive ductal carcinoma of the right breast with synchronous Stage IIIB, cT3N0M0 grade 3, anticipated to be functionally triple negative disease in the left breast. Dr. Lisbeth Renshaw discusses the final pathology findings and reviews the nature of bilateral breast disease and her course to date. She had a good response to treatment in the right breast, and her right axillary nodes were never proven to be involved. Her left breast specimen still had T3 disease despite chemotherapy. Dr. Lisbeth Renshaw recommends  external radiotherapy to the left chest wall and regional nodes  to reduce risks of local recurrence followed by antiestrogen therapy. We discussed the risks, benefits, short, and long term effects of radiotherapy, as well as the curative intent, and the patient is interested in proceeding. Dr. Lisbeth Renshaw discusses the delivery and logistics of radiotherapy and anticipates a course of 6 1/2 weeks of radiotherapy to the left chest wall and regional nodes with deep inspiration breath hold technique. Written consent is obtained and placed in the chart, a copy was provided to the patient. She will simulate today.  2. Intertrigo. I called in a prescription to her pharmacy for Fluconazole 150 mg #4 to be take once weekly x 4 weeks.   In a visit lasting 45 minutes, greater than 50% of the time was spent face to face discussing the patient's condition, in preparation for the discussion, and coordinating the patient's care.   The above documentation reflects my direct findings during this shared patient visit. Please see the separate note by Dr. Lisbeth Renshaw on this date for the remainder of the patient's plan of care.    Carola Rhine, [facility]    **Disclaimer: This note was dictated with voice recognition software. Similar sounding words can inadvertently be transcribed and this note may contain transcription errors which may not have been corrected upon publication of note.**

## 2021-09-25 DIAGNOSIS — J9601 Acute respiratory failure with hypoxia: Secondary | ICD-10-CM | POA: Diagnosis not present

## 2021-09-27 ENCOUNTER — Other Ambulatory Visit: Payer: Self-pay

## 2021-09-27 ENCOUNTER — Ambulatory Visit: Payer: 59 | Admitting: Rehabilitation

## 2021-09-27 ENCOUNTER — Encounter: Payer: Self-pay | Admitting: Rehabilitation

## 2021-09-27 ENCOUNTER — Encounter: Payer: Self-pay | Admitting: *Deleted

## 2021-09-27 DIAGNOSIS — C50512 Malignant neoplasm of lower-outer quadrant of left female breast: Secondary | ICD-10-CM

## 2021-09-27 DIAGNOSIS — R293 Abnormal posture: Secondary | ICD-10-CM | POA: Diagnosis not present

## 2021-09-27 DIAGNOSIS — M25612 Stiffness of left shoulder, not elsewhere classified: Secondary | ICD-10-CM

## 2021-09-27 DIAGNOSIS — C50511 Malignant neoplasm of lower-outer quadrant of right female breast: Secondary | ICD-10-CM | POA: Diagnosis not present

## 2021-09-27 DIAGNOSIS — Z483 Aftercare following surgery for neoplasm: Secondary | ICD-10-CM | POA: Diagnosis not present

## 2021-09-27 DIAGNOSIS — Z170 Estrogen receptor positive status [ER+]: Secondary | ICD-10-CM | POA: Diagnosis not present

## 2021-09-27 DIAGNOSIS — M6281 Muscle weakness (generalized): Secondary | ICD-10-CM | POA: Diagnosis not present

## 2021-09-27 DIAGNOSIS — Z171 Estrogen receptor negative status [ER-]: Secondary | ICD-10-CM | POA: Diagnosis not present

## 2021-09-27 DIAGNOSIS — M25611 Stiffness of right shoulder, not elsewhere classified: Secondary | ICD-10-CM | POA: Diagnosis not present

## 2021-09-27 NOTE — Addendum Note (Signed)
Addended by: Stark Bray on: 09/27/2021 02:55 PM   Modules accepted: Orders

## 2021-09-27 NOTE — Therapy (Signed)
[facility] @ [address] [place], [place], [address] Phone: [phone]   Fax:  [phone]  Physical Therapy Treatment  Patient Details  Name: Leslie Wright MRN: [id number] Date of Birth: April 30, 1966 Referring Provider (PT): Magrinat   Encounter Date: 09/27/2021   PT End of Session - 09/27/21 1439     Visit Number 4    Number of Visits 10    Date for PT Re-Evaluation 12/20/21    PT Start Time 1400    PT Stop Time 1433    PT Time Calculation (min) 33 min    Activity Tolerance Treatment limited secondary to medical complications (Comment);Patient tolerated treatment well    Behavior During Therapy [facility] for tasks assessed/performed             Past Medical History:  Diagnosis Date   Acid reflux    Anemia    Arthritis    "maybe in my fingers" (03/26/2018)   Breast cancer ([place]) 12/2020   both sides   Concussion 2001   no deficit had tia right after no problems since   Family history of adverse reaction to anesthesia    "brother, Timmy, and my mom always get sick" (03/26/2018)   Family history of brain cancer    Family history of breast cancer    Family history of colon cancer    Family history of lung cancer    Family history of pancreatic cancer    Family history of prostate cancer    Headache    High blood pressure    High cholesterol    Hip pain    "left; before OR" (03/26/2018)   History of chemotherapy last done 02-17-2021   History of hiatal hernia    History of kidney stones    OSA (obstructive sleep apnea)    "can't tolerate the mask" (03/26/2018)    PONV (postoperative nausea and vomiting)    does not need much anesthesia, slow to awlaekn in past   Pre-diabetes    Shortness of breath    with exertion   TIA (transient ischemic attack)    15 years ago   Wears partial dentures upper    Past Surgical History:  Procedure Laterality Date   CARPAL TUNNEL RELEASE Right ~ 2006   JOINT REPLACEMENT  Left    total left hip   LAPAROSCOPIC CHOLECYSTECTOMY  12/2010   LASIK Bilateral 2000   PORT A CATH REVISION Left 02/21/2021   Procedure: PORT A CATH REVISION;  Surgeon: Stark Klein, MD;  Location: [facility];  Service: General;  Laterality: Left;  60 MINUTES ROOM 5   PORTACATH PLACEMENT N/A 02/08/2021   Procedure: INSERTION PORT-A-CATH;  Surgeon: Stark Klein, MD;  [facility];  Service: General;  Laterality: N/A;   RADIOACTIVE SEED GUIDED AXILLARY SENTINEL LYMPH NODE Right 08/08/2021   Procedure: RADIOACTIVE SEED GUIDED RIGHT AXILLARY SENTINEL LYMPH NODE BIOPSY;  Surgeon: Stark Klein, MD;  Location: [facility];  Service: General;  Laterality: Right;   SENTINEL NODE BIOPSY Bilateral 08/08/2021   Procedure: BILATERAL SENTINEL NODE BIOPSY;  Surgeon: Stark Klein, MD;  Location: [facility];  Service: General;  Laterality: Bilateral;   TOTAL HIP ARTHROPLASTY Left 03/25/2018   Procedure: LEFT TOTAL HIP ARTHROPLASTY ANTERIOR APPROACH;  Surgeon: Mcarthur Rossetti, MD;  Location: [facility];  Service: Orthopedics;  Laterality: Left;   TOTAL MASTECTOMY Bilateral 08/08/2021   Procedure: BILATERAL TOTAL MASTECTOMY;  Surgeon: Stark Klein, MD;  Location: [facility];  Service: General;  Laterality: Bilateral;    There were no vitals filed for this visit.   Subjective Assessment - 09/27/21 1358     Subjective I saw Shona Simpson last week.  She gave me something for a possible yeast infection combined with psoriasis over the chest    Pertinent History Bilateral breast cancer - R breast IDC grade 2 ER+ Her2-; L breast IDC grade 3 DCIS triple negative, completed neoadjuvant chemo, ; bilateral mastectomy 08/08/21 R SLNB (0/2), L SLNB (0/4), will begin radiation    Currently in Pain? No/denies                [facility] PT Assessment - 09/27/21 0001       Observation/Other Assessments   Observations bright red yeast rash bilateral chest and up anterior chest      AROM   Right Shoulder  Flexion 160 Degrees    Right Shoulder ABduction 145 Degrees    Left Shoulder Flexion 160 Degrees    Left Shoulder ABduction 165 Degrees                           [facility] Adult PT Treatment/Exercise - 09/27/21 0001       Shoulder Exercises: Pulleys   Flexion 2 minutes    ABduction 2 minutes      Manual Therapy   Passive ROM to bil shoulders into flexion, abduc, ER without much limitation or pull                          PT Long Term Goals - 09/27/21 1443       PT LONG TERM GOAL #1   Title Pt will return to baseline shoulder ROM measurements and not demonstrate any signs or symptoms of lymphedema    Status Achieved      PT LONG TERM GOAL #2   Title Pt will demonstrate 153 degrees of bilateral shoulder flexion to allow her to reach overhead    Status Achieved      PT LONG TERM GOAL #3   Title Pt will demonstrate 170 degrees of bilateral shoulder abduction to allow pt to reach out to the side    Status Achieved                   Plan - 09/27/21 1440     Clinical Impression Statement pt is now well healed but has had an episode of psoriasis across the chest and upper arms with yeast infection also possible.  Pt started medication yesterday and today the chest appears angry and irritated.  Continued PROM but pt feels no pull and AROM has reached baseline.  Discussed POC with pt learning supine or standing scap series for radiation with possible recheck post radiaiton.    PT Frequency 2x / week    PT Duration 12 weeks    PT Treatment/Interventions ADLs/Self Care Home Management;Patient/family education;Therapeutic exercise;Scar mobilization;Passive range of motion;Manual techniques;Manual lymph drainage;Taping;Vasopneumatic Device    PT Next Visit Plan sign pt up for ABC class or discuss risk reduction, issue BMDC follow up, teach supine or standing scap with progression instruction as pt has returned to baseline AROM.  Schedule 2-3 weeks post  rad?    Consulted and Agree with Plan of Care Patient             Patient will benefit from skilled therapeutic intervention in order to improve the following deficits  and impairments:  Postural dysfunction, Decreased knowledge of precautions, Pain, Impaired UE functional use, Increased fascial restricitons, Decreased strength, Decreased range of motion, Decreased scar mobility  Visit Diagnosis: Stiffness of left shoulder, not elsewhere classified  Stiffness of right shoulder, not elsewhere classified  Aftercare following surgery for neoplasm  Muscle weakness (generalized)  Abnormal posture  Malignant neoplasm of lower-outer quadrant of right breast of female, estrogen receptor positive ([place])  Malignant neoplasm of lower-outer quadrant of left breast of female, estrogen receptor negative ([place])     Problem List Patient Active Problem List   Diagnosis Date Noted   Bilateral breast cancer ([place]) 08/08/2021   CKD (chronic kidney disease) stage 3, GFR 30-59 ml/min ([place]) 04/21/2021   Port-A-Cath in place 03/09/2021   Encounter for antineoplastic chemotherapy 02/17/2021   Encounter for immunotherapy 02/17/2021   Genetic testing 02/14/2021   Hepatic steatosis 02/09/2021   Multilevel degenerative disc disease 02/09/2021   Family history of breast cancer    Family history of pancreatic cancer    Family history of colon cancer    Family history of lung cancer    Family history of prostate cancer    Family history of brain cancer    Malignant neoplasm of overlapping sites of left breast in female, estrogen receptor negative ([place]) 01/25/2021   Malignant neoplasm of lower-outer quadrant of right breast of female, estrogen receptor positive ([place]) 01/23/2021   Status post left hip replacement 05/19/2018   Acute deep vein thrombosis (DVT) of distal end of right lower extremity ([place]) 05/19/2018   Unilateral primary osteoarthritis, left hip 03/25/2018   Status post total replacement of  left hip 03/25/2018   Hyperlipidemia 04/11/2017   Class 3 obesity with serious comorbidity and body mass index (BMI) of 40.0 to 44.9 in adult 04/11/2017   Prediabetes 02/04/2017   Mixed hyperlipidemia 01/09/2017   Insulin resistance 11/27/2016   Essential hypertension 10/25/2016   Morbid obesity with BMI of 40.0-44.9, adult ([place]) 10/25/2016   Vitamin D deficiency 10/25/2016    Stark Bray, PT 09/27/2021, 2:44 PM  [facility] @ [place] [place] [place], [place], [address] Phone: [phone]   Fax:  [phone]  Name: Leslie Wright MRN: [id number] Date of Birth: November 07, 1965

## 2021-09-28 ENCOUNTER — Encounter (HOSPITAL_BASED_OUTPATIENT_CLINIC_OR_DEPARTMENT_OTHER): Payer: Self-pay | Admitting: General Surgery

## 2021-09-28 ENCOUNTER — Ambulatory Visit: Payer: 59

## 2021-09-28 ENCOUNTER — Other Ambulatory Visit: Payer: 59

## 2021-09-28 DIAGNOSIS — Z171 Estrogen receptor negative status [ER-]: Secondary | ICD-10-CM | POA: Insufficient documentation

## 2021-09-28 DIAGNOSIS — C50812 Malignant neoplasm of overlapping sites of left female breast: Secondary | ICD-10-CM | POA: Insufficient documentation

## 2021-10-02 NOTE — Addendum Note (Signed)
Encounter addended by: Kyung Rudd, MD on: 10/02/2021 12:13 PM  Actions taken: Edit attestation on clinical note

## 2021-10-03 ENCOUNTER — Encounter: Payer: Self-pay | Admitting: Physical Therapy

## 2021-10-03 ENCOUNTER — Other Ambulatory Visit: Payer: Self-pay

## 2021-10-03 ENCOUNTER — Ambulatory Visit: Payer: 59 | Attending: General Surgery | Admitting: Physical Therapy

## 2021-10-03 DIAGNOSIS — M25611 Stiffness of right shoulder, not elsewhere classified: Secondary | ICD-10-CM

## 2021-10-03 DIAGNOSIS — M25612 Stiffness of left shoulder, not elsewhere classified: Secondary | ICD-10-CM | POA: Diagnosis not present

## 2021-10-03 DIAGNOSIS — Z483 Aftercare following surgery for neoplasm: Secondary | ICD-10-CM

## 2021-10-03 DIAGNOSIS — M6281 Muscle weakness (generalized): Secondary | ICD-10-CM

## 2021-10-03 DIAGNOSIS — Z170 Estrogen receptor positive status [ER+]: Secondary | ICD-10-CM | POA: Diagnosis not present

## 2021-10-03 DIAGNOSIS — R293 Abnormal posture: Secondary | ICD-10-CM

## 2021-10-03 DIAGNOSIS — Z171 Estrogen receptor negative status [ER-]: Secondary | ICD-10-CM

## 2021-10-03 DIAGNOSIS — C50511 Malignant neoplasm of lower-outer quadrant of right female breast: Secondary | ICD-10-CM | POA: Diagnosis not present

## 2021-10-03 DIAGNOSIS — C50512 Malignant neoplasm of lower-outer quadrant of left female breast: Secondary | ICD-10-CM | POA: Diagnosis not present

## 2021-10-03 NOTE — Patient Instructions (Signed)
Over Head Pull: Narrow and Wide Grip   Cancer Rehab [phone]   On back, knees bent, feet flat, band across thighs, elbows straight but relaxed. Pull hands apart (start). Keeping elbows straight, bring arms up and over head, hands toward floor. Keep pull steady on band. Hold momentarily. Return slowly, keeping pull steady, back to start. Then do same with a wider grip on the band (past shoulder width) Repeat _10__ times. Band color __yellow____   Side Pull: Double Arm   On back, knees bent, feet flat. Arms perpendicular to body, shoulder level, elbows straight but relaxed. Pull arms out to sides, elbows straight. Resistance band comes across collarbones, hands toward floor. Hold momentarily. Slowly return to starting position. Repeat _10__ times. Band color _yellow____   Sword   On back, knees bent, feet flat, left hand on left hip, right hand above left. Pull right arm DIAGONALLY (hip to shoulder) across chest. Bring right arm along head toward floor. Hold momentarily. Thumb pointed down when by hip and rotates upwards when by head. Slowly return to starting position. Repeat _10__ times. Do with left arm. Band color _yellow_____   Shoulder Rotation: Double Arm   On back, knees bent, feet flat, elbows tucked at sides, bent 90, hands palms up. Pull hands apart and down toward floor, keeping elbows near sides. Hold momentarily. Slowly return to starting position. Repeat _10__ times. Band color __yellow____

## 2021-10-03 NOTE — Therapy (Signed)
[facility] @ [place], [place], [address] Phone: [phone]   Fax:  [phone]  Physical Therapy Treatment  Patient Details  Name: Leslie Wright MRN: [id number] Date of Birth: 1966/05/23 Referring Provider (PT): Magrinat   Encounter Date: 10/03/2021   PT End of Session - 10/03/21 1546     Visit Number 5    Number of Visits 10    Date for PT Re-Evaluation 12/20/21    PT Start Time 5597    PT Stop Time 1540    PT Time Calculation (min) 34 min    Activity Tolerance Patient tolerated treatment well    Behavior During Therapy [facility] for tasks assessed/performed             Past Medical History:  Diagnosis Date   Acid reflux    Anemia    Arthritis    "maybe in my fingers" (03/26/2018)   Breast cancer ([place]) 12/2020   both sides   Concussion 2001   no deficit had tia right after no problems since   Family history of adverse reaction to anesthesia    "brother, Timmy, and my mom always get sick" (03/26/2018)   Family history of brain cancer    Family history of breast cancer    Family history of colon cancer    Family history of lung cancer    Family history of pancreatic cancer    Family history of prostate cancer    Headache    High blood pressure    High cholesterol    Hip pain    "left; before OR" (03/26/2018)   History of chemotherapy last done 02-17-2021   History of hiatal hernia    History of kidney stones    OSA (obstructive sleep apnea)    "can't tolerate the mask" (03/26/2018)    PONV (postoperative nausea and vomiting)    does not need much anesthesia, slow to awlaekn in past   Pre-diabetes    Shortness of breath    with exertion   TIA (transient ischemic attack)    15 years ago   Wears partial dentures upper    Past Surgical History:  Procedure Laterality Date   CARPAL TUNNEL RELEASE Right ~ 2006   JOINT REPLACEMENT Left    total left hip   LAPAROSCOPIC CHOLECYSTECTOMY  12/2010    LASIK Bilateral 2000   PORT A CATH REVISION Left 02/21/2021   Procedure: PORT A CATH REVISION;  Surgeon: Stark Klein, MD;  Location: [facility];  Service: General;  Laterality: Left;  60 MINUTES ROOM 5   PORTACATH PLACEMENT N/A 02/08/2021   Procedure: INSERTION PORT-A-CATH;  Surgeon: Stark Klein, MD;  Location: [facility];  Service: General;  Laterality: N/A;   RADIOACTIVE SEED GUIDED AXILLARY SENTINEL LYMPH NODE Right 08/08/2021   Procedure: RADIOACTIVE SEED GUIDED RIGHT AXILLARY SENTINEL LYMPH NODE BIOPSY;  Surgeon: Stark Klein, MD;  Location: [facility];  Service: General;  Laterality: Right;   SENTINEL NODE BIOPSY Bilateral 08/08/2021   Procedure: BILATERAL SENTINEL NODE BIOPSY;  Surgeon: Stark Klein, MD;  Location: [facility];  Service: General;  Laterality: Bilateral;   TOTAL HIP ARTHROPLASTY Left 03/25/2018   Procedure: LEFT TOTAL HIP ARTHROPLASTY ANTERIOR APPROACH;  Surgeon: Mcarthur Rossetti, MD;  Location: [facility];  Service: Orthopedics;  Laterality: Left;   TOTAL MASTECTOMY Bilateral 08/08/2021   Procedure: BILATERAL TOTAL MASTECTOMY;  Surgeon: Stark Klein, MD;  Location: [facility];  Service: General;  Laterality: Bilateral;  There were no vitals filed for this visit.   Subjective Assessment - 10/03/21 1507     Subjective The infection on my chest is doing better. I am not having any issues with ROM or tightness.    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 No/denies    Pain Score 0-No pain                               [facility] Adult PT Treatment/Exercise - 10/03/21 0001       Shoulder Exercises: Supine   Horizontal ABduction Strengthening;Both;10 reps;Theraband   pt returned therapist demo    Theraband Level (Shoulder Horizontal ABduction) Level 1 (Yellow)    External Rotation Strengthening;Both;10 reps;Theraband   pt returned therapist demo   Theraband Level (Shoulder External Rotation) Level 1 (Yellow)    Flexion Strengthening;Both;10 reps;Theraband   pt returned therapist demo; narrow and wide grip   Theraband Level (Shoulder Flexion) Level 1 (Yellow)    Diagonals Strengthening;Both;10 reps;Theraband   pt returned therapist demo   Theraband Level (Shoulder Diagonals) Level 1 (Yellow)      Shoulder Exercises: Pulleys   Flexion 2 minutes    ABduction 2 minutes      Shoulder Exercises: Therapy Ball   Flexion 10 reps;Both   with gentle stretch at end range   ABduction Both;10 reps   with gentle stretch at end range                         PT Long Term Goals - 09/27/21 1443       PT LONG TERM GOAL #1   Title Pt will return to baseline shoulder ROM measurements and not demonstrate any signs or symptoms of lymphedema    Status Achieved      PT LONG TERM GOAL #2   Title Pt will demonstrate 153 degrees of bilateral shoulder flexion to allow her to reach overhead    Status Achieved      PT LONG TERM GOAL #3   Title Pt will demonstrate 170 degrees of bilateral shoulder abduction to allow pt to reach out to the side    Status Achieved                   Plan - 10/03/21 1549     Clinical Impression Statement Pt is no longer limited with ROM. Instructed pt in supine scapular strengthening exercises and issued yellow band and educated pt when to progress to red band. Issued this to pt as well. Will reassess pt once she completes radiation. Educated pt on importance of continuing stretches throughout radiation to avoid decreased motion. Pt verbalized understanding.    PT Frequency 2x / week    PT Duration 12 weeks    PT Treatment/Interventions ADLs/Self Care Home Management;Patient/family education;Therapeutic exercise;Scar mobilization;Passive range  of motion;Manual techniques;Manual lymph drainage;Taping;Vasopneumatic Device    PT Next Visit Plan reassess after radiation - sign pt up for ABC class or discuss risk reduction    PT Home Exercise Plan supine scap    Consulted and Agree with Plan of Care Patient  Patient will benefit from skilled therapeutic intervention in order to improve the following deficits and impairments:  Postural dysfunction, Decreased knowledge of precautions, Pain, Impaired UE functional use, Increased fascial restricitons, Decreased strength, Decreased range of motion, Decreased scar mobility  Visit Diagnosis: Stiffness of left shoulder, not elsewhere classified  Stiffness of right shoulder, not elsewhere classified  Aftercare following surgery for neoplasm  Muscle weakness (generalized)  Abnormal posture  Malignant neoplasm of lower-outer quadrant of right breast of female, estrogen receptor positive ([place])  Malignant neoplasm of lower-outer quadrant of left breast of female, estrogen receptor negative ([place])     Problem List Patient Active Problem List   Diagnosis Date Noted   Bilateral breast cancer ([place]) 08/08/2021   CKD (chronic kidney disease) stage 3, GFR 30-59 ml/min ([place]) 04/21/2021   Port-A-Cath in place 03/09/2021   Encounter for antineoplastic chemotherapy 02/17/2021   Encounter for immunotherapy 02/17/2021   Genetic testing 02/14/2021   Hepatic steatosis 02/09/2021   Multilevel degenerative disc disease 02/09/2021   Family history of breast cancer    Family history of pancreatic cancer    Family history of colon cancer    Family history of lung cancer    Family history of prostate cancer    Family history of brain cancer    Malignant neoplasm of overlapping sites of left breast in female, estrogen receptor negative ([place]) 01/25/2021   Malignant neoplasm of lower-outer quadrant of right breast of female, estrogen receptor positive ([place]) 01/23/2021   Status post left  hip replacement 05/19/2018   Acute deep vein thrombosis (DVT) of distal end of right lower extremity ([place]) 05/19/2018   Unilateral primary osteoarthritis, left hip 03/25/2018   Status post total replacement of left hip 03/25/2018   Hyperlipidemia 04/11/2017   Class 3 obesity with serious comorbidity and body mass index (BMI) of 40.0 to 44.9 in adult 04/11/2017   Prediabetes 02/04/2017   Mixed hyperlipidemia 01/09/2017   Insulin resistance 11/27/2016   Essential hypertension 10/25/2016   Morbid obesity with BMI of 40.0-44.9, adult ([place]) 10/25/2016   Vitamin D deficiency 10/25/2016    Manus Gunning, PT 10/03/2021, 3:51 PM  [facility] @ [place], [place], [address] Phone: [phone]   Fax:  [phone]  Name: Leslie Wright MRN: [id number] Date of Birth: [birth year]   Manus Gunning, PT 10/03/21 3:52 PM

## 2021-10-04 DIAGNOSIS — C50412 Malignant neoplasm of upper-outer quadrant of left female breast: Secondary | ICD-10-CM | POA: Diagnosis not present

## 2021-10-04 DIAGNOSIS — Z510 Encounter for antineoplastic radiation therapy: Secondary | ICD-10-CM | POA: Insufficient documentation

## 2021-10-04 DIAGNOSIS — C50511 Malignant neoplasm of lower-outer quadrant of right female breast: Secondary | ICD-10-CM | POA: Diagnosis not present

## 2021-10-05 ENCOUNTER — Ambulatory Visit
Admission: RE | Admit: 2021-10-05 | Discharge: 2021-10-05 | Disposition: A | Discharge status: Home / Self Care | Payer: 59 | Source: Ambulatory Visit | Attending: Radiation Oncology | Admitting: Radiation Oncology

## 2021-10-05 ENCOUNTER — Encounter: Payer: 59 | Admitting: Physical Therapy

## 2021-10-05 ENCOUNTER — Other Ambulatory Visit: Payer: Self-pay

## 2021-10-05 DIAGNOSIS — Z510 Encounter for antineoplastic radiation therapy: Secondary | ICD-10-CM | POA: Diagnosis not present

## 2021-10-05 DIAGNOSIS — C50511 Malignant neoplasm of lower-outer quadrant of right female breast: Secondary | ICD-10-CM | POA: Diagnosis not present

## 2021-10-05 DIAGNOSIS — Z171 Estrogen receptor negative status [ER-]: Secondary | ICD-10-CM | POA: Diagnosis not present

## 2021-10-05 DIAGNOSIS — C50812 Malignant neoplasm of overlapping sites of left female breast: Secondary | ICD-10-CM | POA: Diagnosis not present

## 2021-10-05 DIAGNOSIS — C50412 Malignant neoplasm of upper-outer quadrant of left female breast: Secondary | ICD-10-CM | POA: Diagnosis not present

## 2021-10-06 ENCOUNTER — Ambulatory Visit
Admission: RE | Admit: 2021-10-06 | Discharge: 2021-10-06 | Disposition: A | Discharge status: Home / Self Care | Payer: 59 | Source: Ambulatory Visit | Attending: Radiation Oncology | Admitting: Radiation Oncology

## 2021-10-06 DIAGNOSIS — Z171 Estrogen receptor negative status [ER-]: Secondary | ICD-10-CM | POA: Diagnosis not present

## 2021-10-06 DIAGNOSIS — C50412 Malignant neoplasm of upper-outer quadrant of left female breast: Secondary | ICD-10-CM | POA: Diagnosis not present

## 2021-10-06 DIAGNOSIS — Z510 Encounter for antineoplastic radiation therapy: Secondary | ICD-10-CM | POA: Diagnosis not present

## 2021-10-06 DIAGNOSIS — C50812 Malignant neoplasm of overlapping sites of left female breast: Secondary | ICD-10-CM | POA: Diagnosis not present

## 2021-10-06 DIAGNOSIS — C50511 Malignant neoplasm of lower-outer quadrant of right female breast: Secondary | ICD-10-CM | POA: Diagnosis not present

## 2021-10-06 MED ORDER — RADIAPLEXRX EX GEL: Freq: Once | CUTANEOUS | Status: AC

## 2021-10-06 MED ORDER — ALRA NON-METALLIC DEODORANT (RAD-ONC)
1.0000 "application " | Freq: Once | TOPICAL | Status: AC
  Administered 2021-10-06: 1 via TOPICAL

## 2021-10-06 NOTE — Progress Notes (Signed)
Pt here for patient teaching.  Pt given Radiation and You booklet, skin care instructions, Alra deodorant, and Radiaplex gel.  Reviewed areas of pertinence such as fatigue, hair loss, skin changes, breast tenderness, and breast swelling . Pt able to give teach back of to pat skin and use unscented/gentle soap,apply Radiaplex bid, avoid applying anything to skin within 4 hours of treatment, avoid wearing an under wire bra, and to use an electric razor if they must shave. Pt verbalizes understanding of information given and will contact nursing with any questions or concerns.    Michaiah Holsopple M. Jahsiah Carpenter RN, BSN      

## 2021-10-09 ENCOUNTER — Other Ambulatory Visit (HOSPITAL_BASED_OUTPATIENT_CLINIC_OR_DEPARTMENT_OTHER): Payer: 59

## 2021-10-09 ENCOUNTER — Other Ambulatory Visit: Payer: Self-pay

## 2021-10-09 ENCOUNTER — Ambulatory Visit
Admission: RE | Admit: 2021-10-09 | Discharge: 2021-10-09 | Disposition: A | Discharge status: Home / Self Care | Payer: 59 | Source: Ambulatory Visit | Attending: Radiation Oncology | Admitting: Radiation Oncology

## 2021-10-09 DIAGNOSIS — I10 Essential (primary) hypertension: Secondary | ICD-10-CM | POA: Diagnosis not present

## 2021-10-09 DIAGNOSIS — C50812 Malignant neoplasm of overlapping sites of left female breast: Secondary | ICD-10-CM | POA: Diagnosis not present

## 2021-10-09 DIAGNOSIS — Z452 Encounter for adjustment and management of vascular access device: Secondary | ICD-10-CM | POA: Diagnosis not present

## 2021-10-09 DIAGNOSIS — Z79899 Other long term (current) drug therapy: Secondary | ICD-10-CM | POA: Diagnosis not present

## 2021-10-09 DIAGNOSIS — Z87891 Personal history of nicotine dependence: Secondary | ICD-10-CM | POA: Diagnosis not present

## 2021-10-09 DIAGNOSIS — G4730 Sleep apnea, unspecified: Secondary | ICD-10-CM | POA: Diagnosis not present

## 2021-10-09 DIAGNOSIS — C50511 Malignant neoplasm of lower-outer quadrant of right female breast: Secondary | ICD-10-CM | POA: Diagnosis not present

## 2021-10-09 DIAGNOSIS — Z853 Personal history of malignant neoplasm of breast: Secondary | ICD-10-CM | POA: Diagnosis not present

## 2021-10-09 DIAGNOSIS — Z9013 Acquired absence of bilateral breasts and nipples: Secondary | ICD-10-CM | POA: Diagnosis not present

## 2021-10-09 DIAGNOSIS — Z171 Estrogen receptor negative status [ER-]: Secondary | ICD-10-CM | POA: Diagnosis not present

## 2021-10-09 DIAGNOSIS — K219 Gastro-esophageal reflux disease without esophagitis: Secondary | ICD-10-CM | POA: Diagnosis not present

## 2021-10-09 DIAGNOSIS — C50412 Malignant neoplasm of upper-outer quadrant of left female breast: Secondary | ICD-10-CM | POA: Diagnosis not present

## 2021-10-09 DIAGNOSIS — Z6838 Body mass index (BMI) 38.0-38.9, adult: Secondary | ICD-10-CM | POA: Diagnosis not present

## 2021-10-09 DIAGNOSIS — Z9221 Personal history of antineoplastic chemotherapy: Secondary | ICD-10-CM | POA: Diagnosis not present

## 2021-10-09 DIAGNOSIS — Z510 Encounter for antineoplastic radiation therapy: Secondary | ICD-10-CM | POA: Diagnosis not present

## 2021-10-09 LAB — BASIC METABOLIC PANEL
Anion gap: 13 (ref 5–15)
BUN: 11 mg/dL (ref 6–20)
CO2: 22 mmol/L (ref 22–32)
Calcium: 9.2 mg/dL (ref 8.9–10.3)
Chloride: 105 mmol/L (ref 98–111)
Creatinine, Ser: 1.24 mg/dL — ABNORMAL HIGH (ref 0.44–1.00)
GFR, Estimated: 51 mL/min — ABNORMAL LOW (ref >60)
Glucose, Bld: 124 mg/dL — ABNORMAL HIGH (ref 70–99)
Potassium: 3.9 mmol/L (ref 3.5–5.1)
Sodium: 140 mmol/L (ref 135–145)

## 2021-10-09 NOTE — Progress Notes (Signed)

## 2021-10-10 ENCOUNTER — Ambulatory Visit
Admission: RE | Admit: 2021-10-10 | Discharge: 2021-10-10 | Disposition: A | Discharge status: Home / Self Care | Payer: 59 | Source: Ambulatory Visit | Attending: Radiation Oncology | Admitting: Radiation Oncology

## 2021-10-10 ENCOUNTER — Encounter: Payer: 59 | Admitting: Physical Therapy

## 2021-10-10 DIAGNOSIS — C50511 Malignant neoplasm of lower-outer quadrant of right female breast: Secondary | ICD-10-CM | POA: Diagnosis not present

## 2021-10-10 DIAGNOSIS — C50412 Malignant neoplasm of upper-outer quadrant of left female breast: Secondary | ICD-10-CM | POA: Diagnosis not present

## 2021-10-10 DIAGNOSIS — Z510 Encounter for antineoplastic radiation therapy: Secondary | ICD-10-CM | POA: Diagnosis not present

## 2021-10-11 ENCOUNTER — Ambulatory Visit (HOSPITAL_BASED_OUTPATIENT_CLINIC_OR_DEPARTMENT_OTHER)
Admission: RE | Admit: 2021-10-11 | Discharge: 2021-10-11 | Disposition: A | Discharge status: Home / Self Care | Payer: 59 | Source: Ambulatory Visit | Attending: General Surgery | Admitting: General Surgery

## 2021-10-11 ENCOUNTER — Encounter (HOSPITAL_BASED_OUTPATIENT_CLINIC_OR_DEPARTMENT_OTHER)
Admission: RE | Disposition: A | Discharge status: Home / Self Care | Payer: Self-pay | Source: Ambulatory Visit | Attending: General Surgery

## 2021-10-11 ENCOUNTER — Other Ambulatory Visit: Payer: Self-pay

## 2021-10-11 ENCOUNTER — Ambulatory Visit (HOSPITAL_BASED_OUTPATIENT_CLINIC_OR_DEPARTMENT_OTHER): Payer: 59 | Admitting: Anesthesiology

## 2021-10-11 ENCOUNTER — Encounter (HOSPITAL_BASED_OUTPATIENT_CLINIC_OR_DEPARTMENT_OTHER): Payer: Self-pay | Admitting: General Surgery

## 2021-10-11 ENCOUNTER — Ambulatory Visit
Admission: RE | Admit: 2021-10-11 | Discharge: 2021-10-11 | Disposition: A | Discharge status: Home / Self Care | Payer: 59 | Source: Ambulatory Visit | Attending: Radiation Oncology | Admitting: Radiation Oncology

## 2021-10-11 DIAGNOSIS — Z6838 Body mass index (BMI) 38.0-38.9, adult: Secondary | ICD-10-CM | POA: Insufficient documentation

## 2021-10-11 DIAGNOSIS — E8881 Metabolic syndrome: Secondary | ICD-10-CM

## 2021-10-11 DIAGNOSIS — C50511 Malignant neoplasm of lower-outer quadrant of right female breast: Secondary | ICD-10-CM | POA: Diagnosis not present

## 2021-10-11 DIAGNOSIS — G4730 Sleep apnea, unspecified: Secondary | ICD-10-CM | POA: Diagnosis not present

## 2021-10-11 DIAGNOSIS — Z79899 Other long term (current) drug therapy: Secondary | ICD-10-CM | POA: Insufficient documentation

## 2021-10-11 DIAGNOSIS — K219 Gastro-esophageal reflux disease without esophagitis: Secondary | ICD-10-CM | POA: Insufficient documentation

## 2021-10-11 DIAGNOSIS — Z452 Encounter for adjustment and management of vascular access device: Secondary | ICD-10-CM | POA: Insufficient documentation

## 2021-10-11 DIAGNOSIS — Z87891 Personal history of nicotine dependence: Secondary | ICD-10-CM | POA: Diagnosis not present

## 2021-10-11 DIAGNOSIS — Z9013 Acquired absence of bilateral breasts and nipples: Secondary | ICD-10-CM | POA: Insufficient documentation

## 2021-10-11 DIAGNOSIS — I10 Essential (primary) hypertension: Secondary | ICD-10-CM | POA: Insufficient documentation

## 2021-10-11 DIAGNOSIS — C50912 Malignant neoplasm of unspecified site of left female breast: Secondary | ICD-10-CM | POA: Diagnosis not present

## 2021-10-11 DIAGNOSIS — Z853 Personal history of malignant neoplasm of breast: Secondary | ICD-10-CM | POA: Insufficient documentation

## 2021-10-11 DIAGNOSIS — C50412 Malignant neoplasm of upper-outer quadrant of left female breast: Secondary | ICD-10-CM | POA: Diagnosis not present

## 2021-10-11 DIAGNOSIS — C50911 Malignant neoplasm of unspecified site of right female breast: Secondary | ICD-10-CM | POA: Diagnosis not present

## 2021-10-11 DIAGNOSIS — Z9221 Personal history of antineoplastic chemotherapy: Secondary | ICD-10-CM | POA: Insufficient documentation

## 2021-10-11 DIAGNOSIS — Z510 Encounter for antineoplastic radiation therapy: Secondary | ICD-10-CM | POA: Diagnosis not present

## 2021-10-11 DIAGNOSIS — R7303 Prediabetes: Secondary | ICD-10-CM

## 2021-10-11 HISTORY — PX: PORT-A-CATH REMOVAL: SHX5289

## 2021-10-11 SURGERY — REMOVAL PORT-A-CATH
Anesthesia: Monitor Anesthesia Care | Site: Chest | Laterality: Left

## 2021-10-11 MED ORDER — ACETAMINOPHEN 500 MG PO TABS
ORAL_TABLET | ORAL | Status: AC
  Filled 2021-10-11: qty 2

## 2021-10-11 MED ORDER — FENTANYL CITRATE (PF) 100 MCG/2ML IJ SOLN: 25.0000 ug | INTRAMUSCULAR | Status: DC | PRN

## 2021-10-11 MED ORDER — LACTATED RINGERS IV SOLN: INTRAVENOUS | Status: DC

## 2021-10-11 MED ORDER — FENTANYL CITRATE (PF) 100 MCG/2ML IJ SOLN
INTRAMUSCULAR | Status: AC
  Filled 2021-10-11: qty 2

## 2021-10-11 MED ORDER — PROPOFOL 10 MG/ML IV BOLUS
INTRAVENOUS | Status: DC | PRN
Start: 2021-10-11 — End: 2021-10-11
  Administered 2021-10-11: 20 mg via INTRAVENOUS
  Administered 2021-10-11 (×2): 30 mg via INTRAVENOUS

## 2021-10-11 MED ORDER — CHLORHEXIDINE GLUCONATE CLOTH 2 % EX PADS: 6.0000 | MEDICATED_PAD | Freq: Once | CUTANEOUS | Status: DC

## 2021-10-11 MED ORDER — CEFAZOLIN SODIUM-DEXTROSE 2-4 GM/100ML-% IV SOLN
INTRAVENOUS | Status: AC
  Filled 2021-10-11: qty 100

## 2021-10-11 MED ORDER — LIDOCAINE-EPINEPHRINE (PF) 1 %-1:200000 IJ SOLN
INTRAMUSCULAR | Status: DC | PRN
  Administered 2021-10-11: 10 mL via INTRAMUSCULAR

## 2021-10-11 MED ORDER — FENTANYL CITRATE (PF) 100 MCG/2ML IJ SOLN
INTRAMUSCULAR | Status: DC | PRN
  Administered 2021-10-11: 25 ug via INTRAVENOUS

## 2021-10-11 MED ORDER — MIDAZOLAM HCL 2 MG/2ML IJ SOLN
INTRAMUSCULAR | Status: AC
  Filled 2021-10-11: qty 2

## 2021-10-11 MED ORDER — PROMETHAZINE HCL 25 MG/ML IJ SOLN: 6.2500 mg | INTRAMUSCULAR | Status: DC | PRN

## 2021-10-11 MED ORDER — MIDAZOLAM HCL 5 MG/5ML IJ SOLN
INTRAMUSCULAR | Status: DC | PRN
  Administered 2021-10-11: 2 mg via INTRAVENOUS

## 2021-10-11 MED ORDER — CEFAZOLIN SODIUM-DEXTROSE 2-4 GM/100ML-% IV SOLN
2.0000 g | INTRAVENOUS | Status: AC
  Administered 2021-10-11: 2 g via INTRAVENOUS

## 2021-10-11 MED ORDER — ONDANSETRON HCL 4 MG/2ML IJ SOLN
INTRAMUSCULAR | Status: DC | PRN
  Administered 2021-10-11: 4 mg via INTRAVENOUS

## 2021-10-11 MED ORDER — ACETAMINOPHEN 500 MG PO TABS
1000.0000 mg | ORAL_TABLET | ORAL | Status: AC
  Administered 2021-10-11: 1000 mg via ORAL

## 2021-10-11 MED ORDER — PROPOFOL 500 MG/50ML IV EMUL
INTRAVENOUS | Status: AC
  Filled 2021-10-11: qty 50

## 2021-10-11 MED ORDER — PROPOFOL 500 MG/50ML IV EMUL
INTRAVENOUS | Status: DC | PRN
  Administered 2021-10-11: 50 ug/kg/min via INTRAVENOUS

## 2021-10-11 SURGICAL SUPPLY — 28 items
ADH SKN CLS APL DERMABOND .7 (GAUZE/BANDAGES/DRESSINGS) ×1
APL PRP STRL LF DISP 70% ISPRP (MISCELLANEOUS) ×1
BLADE HEX COATED 2.75 (ELECTRODE) ×2 IMPLANT
BLADE SURG 15 STRL LF DISP TIS (BLADE) ×1 IMPLANT
BLADE SURG 15 STRL SS (BLADE) ×2
CHLORAPREP W/TINT 26 (MISCELLANEOUS) ×2 IMPLANT
COVER BACK TABLE 60X90IN (DRAPES) ×2 IMPLANT
COVER MAYO STAND STRL (DRAPES) ×2 IMPLANT
DERMABOND ADVANCED (GAUZE/BANDAGES/DRESSINGS) ×1
DERMABOND ADVANCED .7 DNX12 (GAUZE/BANDAGES/DRESSINGS) ×1 IMPLANT
DRAPE LAPAROTOMY 100X72 PEDS (DRAPES) ×2 IMPLANT
DRAPE UTILITY XL STRL (DRAPES) ×2 IMPLANT
ELECT REM PT RETURN 9FT ADLT (ELECTROSURGICAL) ×2
ELECTRODE REM PT RTRN 9FT ADLT (ELECTROSURGICAL) ×1 IMPLANT
GLOVE SURG ENC MOIS LTX SZ6 (GLOVE) ×2 IMPLANT
GLOVE SURG UNDER POLY LF SZ6.5 (GLOVE) ×2 IMPLANT
GOWN STRL REUS W/ TWL LRG LVL3 (GOWN DISPOSABLE) ×1 IMPLANT
GOWN STRL REUS W/TWL 2XL LVL3 (GOWN DISPOSABLE) ×2 IMPLANT
GOWN STRL REUS W/TWL LRG LVL3 (GOWN DISPOSABLE) ×2
NDL HYPO 25X1 1.5 SAFETY (NEEDLE) ×1 IMPLANT
NEEDLE HYPO 25X1 1.5 SAFETY (NEEDLE) ×2 IMPLANT
PACK BASIN DAY SURGERY FS (CUSTOM PROCEDURE TRAY) ×2 IMPLANT
PENCIL SMOKE EVACUATOR (MISCELLANEOUS) ×2 IMPLANT
SUT MNCRL AB 4-0 PS2 18 (SUTURE) ×2 IMPLANT
SUT VIC AB 3-0 SH 27 (SUTURE) ×2
SUT VIC AB 3-0 SH 27X BRD (SUTURE) ×1 IMPLANT
SYR CONTROL 10ML LL (SYRINGE) ×2 IMPLANT
TOWEL GREEN STERILE FF (TOWEL DISPOSABLE) ×2 IMPLANT

## 2021-10-11 NOTE — Anesthesia Preprocedure Evaluation (Addendum)
Anesthesia Evaluation  Patient identified by MRN, date of birth, ID band Patient awake    Reviewed: Allergy & Precautions, NPO status , Patient's Chart, lab work & pertinent test results  History of Anesthesia Complications (+) PONV and history of anesthetic complications  Airway Mallampati: II  TM Distance: >3 FB Neck ROM: Full    Dental  (+) Partial Upper   Pulmonary sleep apnea , former smoker,    Pulmonary exam normal        Cardiovascular hypertension, Pt. on medications Normal cardiovascular exam     Neuro/Psych TIAnegative psych ROS   GI/Hepatic Neg liver ROS, hiatal hernia, GERD  Medicated and Controlled,  Endo/Other  Morbid obesity  Renal/GU Renal InsufficiencyRenal disease  negative genitourinary   Musculoskeletal  (+) Arthritis ,   Abdominal   Peds  Hematology negative hematology ROS (+)   Anesthesia Other Findings Day of surgery medications reviewed with patient.  Reproductive/Obstetrics negative OB ROS                            Anesthesia Physical Anesthesia Plan  ASA: 3  Anesthesia Plan: MAC   Post-op Pain Management: Minimal or no pain anticipated   Induction:   PONV Risk Score and Plan: 3 and Midazolam, Treatment may vary due to age or medical condition, Ondansetron and Propofol infusion  Airway Management Planned: Natural Airway and Simple Face Mask  Additional Equipment: None  Intra-op Plan:   Post-operative Plan:   Informed Consent: I have reviewed the patients History and Physical, chart, labs and discussed the procedure including the risks, benefits and alternatives for the proposed anesthesia with the patient or authorized representative who has indicated his/her understanding and acceptance.       Plan Discussed with: CRNA  Anesthesia Plan Comments:        Anesthesia Quick Evaluation

## 2021-10-11 NOTE — Transfer of Care (Signed)
Immediate Anesthesia Transfer of Care Note  Patient: Leslie Wright  Procedure(s) Performed: PORT REMOVAL (Left: Chest)  Patient Location: PACU  Anesthesia Type:MAC  Level of Consciousness: awake, alert  and oriented  Airway & Oxygen Therapy: Patient Spontanous Breathing and Patient connected to face mask oxygen  Post-op Assessment: Report given to RN and Post -op Vital signs reviewed and stable  Post vital signs: Reviewed and stable  Last Vitals:  Vitals Value Taken Time  BP 91/49 10/11/21 1445  Temp    Pulse 64 10/11/21 1447  Resp 14 10/11/21 1447  SpO2 99 % 10/11/21 1447  Vitals shown include unvalidated device data.  Last Pain:  Vitals:   10/11/21 1259  TempSrc: Oral  PainSc: 0-No pain      Patients Stated Pain Goal: 3 (25/27/12 9290)  Complications: No notable events documented.

## 2021-10-11 NOTE — Anesthesia Procedure Notes (Signed)
Procedure Name: MAC Date/Time: 10/11/2021 2:07 PM Performed by: Glory Buff, CRNA Oxygen Delivery Method: Simple face mask

## 2021-10-11 NOTE — Discharge Instructions (Addendum)
[facility] Office Phone Number [phone]   POST OP INSTRUCTIONS  Always review your discharge instruction sheet given to you by the facility where your surgery was performed.  IF YOU HAVE DISABILITY OR FAMILY LEAVE FORMS, YOU MUST BRING THEM TO THE OFFICE FOR PROCESSING.  DO NOT GIVE THEM TO YOUR DOCTOR.  A prescription for pain medication may be given to you upon discharge.  Take your pain medication as prescribed, if needed.  If narcotic pain medicine is not needed, then you may take acetaminophen (Tylenol) or ibuprofen (Advil) as needed. Take your usually prescribed medications unless otherwise directed If you need a refill on your pain medication, please contact your pharmacy.  They will contact our office to request authorization.  Prescriptions will not be filled after 5pm or on week-ends. You should eat very light the first 24 hours after surgery, such as soup, crackers, pudding, etc.  Resume your normal diet the day after surgery It is common to experience some constipation if taking pain medication after surgery.  Increasing fluid intake and taking a stool softener will usually help or prevent this problem from occurring.  A mild laxative (Milk of Magnesia or Miralax) should be taken according to package directions if there are no bowel movements after 48 hours. You may shower in 48 hours.  The surgical glue will flake off in 2-3 weeks.   ACTIVITIES:  No strenuous activity or heavy lifting for 1 week.   You may drive when you no longer are taking prescription pain medication, you can comfortably wear a seatbelt, and you can safely maneuver your car and apply brakes. RETURN TO WORK:  __________to be determined_______________ Dennis Bast should see your doctor in the office for a follow-up appointment approximately three-four weeks after your surgery.    WHEN TO CALL YOUR DOCTOR: Fever over 101.0 Nausea and/or vomiting. Extreme swelling or bruising. Continued bleeding from  incision. Increased pain, redness, or drainage from the incision.  The clinic staff is available to answer your questions during regular business hours.  Please dont hesitate to call and ask to speak to one of the nurses for clinical concerns.  If you have a medical emergency, go to the nearest emergency room or call 911.  A surgeon from [facility] is always on call at the hospital.  For further questions, please visit [email or web address]    Post Anesthesia Home Care Instructions  Activity: Get plenty of rest for the remainder of the day. A responsible individual must stay with you for 24 hours following the procedure.  For the next 24 hours, DO NOT: -Drive a car -Paediatric nurse -Drink alcoholic beverages -Take any medication unless instructed by your physician -Make any legal decisions or sign important papers.  Meals: Start with liquid foods such as gelatin or soup. Progress to regular foods as tolerated. Avoid greasy, spicy, heavy foods. If nausea and/or vomiting occur, drink only clear liquids until the nausea and/or vomiting subsides. Call your physician if vomiting continues.  Special Instructions/Symptoms: Your throat may feel dry or sore from the anesthesia or the breathing tube placed in your throat during surgery. If this causes discomfort, gargle with warm salt water. The discomfort should disappear within 24 hours.  If you had a scopolamine patch placed behind your ear for the management of post- operative nausea and/or vomiting:  1. The medication in the patch is effective for 72 hours, after which it should be removed.  Wrap patch in a tissue and discard in the trash.  Wash hands thoroughly with soap and water. 2. You may remove the patch earlier than 72 hours if you experience unpleasant side effects which may include dry mouth, dizziness or visual disturbances. 3. Avoid touching the patch. Wash your hands with soap and water after contact with the  patch.      No Tylenol until after 7:00pm, if needed.

## 2021-10-11 NOTE — Op Note (Signed)
°  PRE-OPERATIVE DIAGNOSIS:  un-needed Port-A-Cath for bilateral breast cancer  POST-OPERATIVE DIAGNOSIS:  Same   PROCEDURE:  Procedure(s):  REMOVAL PORT-A-CATH  SURGEON:  Surgeon(s):  Stark Klein, MD  ANESTHESIA:   MAC + local  EBL:   Minimal  SPECIMEN:  None  Complications : none known  Procedure:   Pt was  identified in the holding area and taken to the operating room where she was placed supine on the operating room table.  General anesthesia was induced.  The left upper chest was prepped and draped.  The prior incision was anesthetized with local anesthetic.  The incision was opened with a #15 blade.  The subcutaneous tissue was divided with the cautery.  The port was identified and the capsule opened.  The four 2-0 prolene sutures were removed.  The port was then removed and pressure held on the tract.  The catheter appeared intact without evidence of breakage, length was 27.5 cm.  The wound was inspected for hemostasis, which was achieved with cautery.  The wound was closed with 3-0 vicryl deep dermal interrupted sutures and 4-0 Monocryl running subcuticular suture.  The wound was cleaned, dried, and dressed with dermabond.  The patient was awakened from anesthesia and taken to the PACU in stable condition.  Needle, sponge, and instrument counts are correct.

## 2021-10-11 NOTE — Interval H&P Note (Signed)
History and Physical Interval Note:  10/11/2021 1:58 PM  Leslie Wright  has presented today for surgery, with the diagnosis of PORT IN PLACE.  The various methods of treatment have been discussed with the patient and family. After consideration of risks, benefits and other options for treatment, the patient has consented to  Procedure(s): PORT REMOVAL (N/A) as a surgical intervention.  The patient's history has been reviewed, patient examined, no change in status, stable for surgery.  I have reviewed the patient's chart and labs.  Questions were answered to the patient's satisfaction.     Stark Klein

## 2021-10-11 NOTE — Anesthesia Postprocedure Evaluation (Signed)
Anesthesia Post Note  Patient: Leslie Wright  Procedure(s) Performed: PORT REMOVAL (Left: Chest)     Patient location during evaluation: PACU Anesthesia Type: MAC Level of consciousness: awake and alert and oriented Pain management: pain level controlled Vital Signs Assessment: post-procedure vital signs reviewed and stable Respiratory status: spontaneous breathing, nonlabored ventilation and respiratory function stable Cardiovascular status: blood pressure returned to baseline Postop Assessment: no apparent nausea or vomiting Anesthetic complications: no   No notable events documented.  Last Vitals:  Vitals:   10/11/21 1505 10/11/21 1515  BP:  121/61  Pulse: 67 63  Resp: (!) 22 (!) 22  Temp:  36.5 C  SpO2: 96% 98%    Last Pain:  Vitals:   10/11/21 1515  TempSrc:   PainSc: 0-No pain                 Marthenia Rolling

## 2021-10-11 NOTE — H&P (Signed)
MRN: [id number] DOB: 29-Sep-1966 DATE OF ENCOUNTER: 09/15/2021 Initial History:   Initial cancer history:  Leslie Wright is a 56 yo F referred by Dr. Ammie Ferrier for consultation regarding a breast cancer dx 12/2020. The patient presented with a palpable left breast mass.  She then had dx mammogram and ultrasound.  She had multiple findings:   On the left: 5.8 cm mass at 12:30 1.2 cm Additional group of calcifications "outer right breast"   on the right: Multiple small masses and irregular ducts in the retroareolar right breast. A representative mass was 8 mm. 4 mm indeterminate mass upper inner breast     MR also showed additional findings including another spot in the upper right breast that was a CSL. 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    She hasn't had a personal cancer dx before this, but has family cancer history.  Her maternal aunt had breast cancer dx late 40s, maternal grandfather had a brain tumor, and brother had prostate cancer.  Her paternal grandfather had pancreatic cancer and a paternal uncle had melanoma.  She is nulliparous wtih menarche age 27 and menopause early 54s.     Initial staging CT chest and bone scan were negative. She completed chemo and tolerated it well. She desired bilateral mastectomies without reconstruction.    Interval history  She underwent bilateral mastectomies and bilateral sentinel node biopsies 08/08/21. I saw her around 1-2 weeks post op and her drains weren't ready to come out yet. They still weren't ready the week of thanksgiving 2022. On Monday 11/28, her drain output had come down, but she also was having some soreness and some foul drainage on the left. When I saw her that day, the drains were removed, but her left sided incision had a few punctate openings with some purulent drainage. I started her on antibiotics. I aspirated a seroma 12/2 for 35 mL. It was still  a bit murky so we did 1 week of antibiotics. Additionally, she had an unusual rash along her sternum and medially at the lower border of the mastectomy scars.   She has completed antibiotics. She is feeling improved. She denies pain. She has numbness along the chest wall.     Physical Examination:   Wt Readings from Last 3 Encounters:  10/11/21 111.7 kg  09/21/21 113.5 kg  09/07/21 109.4 kg   Temp Readings from Last 3 Encounters:  10/11/21 97.6 F (36.4 C) (Oral)  09/21/21 (!) 96.9 F (36.1 C) (Temporal)  09/07/21 (!) 97.5 F (36.4 C) (Temporal)   BP Readings from Last 3 Encounters:  10/11/21 (!) 155/88  09/21/21 (!) 158/89  09/07/21 135/84   Pulse Readings from Last 3 Encounters:  10/11/21 86  09/21/21 (!) 102  09/07/21 79    Chest wall: significant improvement in rash over sternum. Less scaling. No erythema either side. Small areas of dehiscence healing at center point of scars. Right side with seroma. 65 mL aspirated. Fluid no longer murky.   Assessment and Plan:   Leslie Wright is a 56 y.o. female who underwent bilateral mastectomies and SLN bxs on 08/08/21.  Diagnoses and all orders for this visit:  Malignant neoplasm of overlapping sites of left breast in female, estrogen receptor negative (CMS-HCC) Assessment & Plan: Seeing rad onc next week. Will get radiation to both sides.   Will likely need a few more weeks of healing prior to XRT.  Port removal set up for 1/11.   If right seroma still present, will aspirate while asleep.   Malignant neoplasm of lower-outer quadrant of right breast of female, estrogen receptor positive (CMS-HCC) Assessment & Plan: See above.  The plan was discussed in detail with the patient today, who expressed understanding. The patient has my contact information, and understands to call me with any additional questions or concerns in the interval. I would be happy to see the patient back sooner if the need arises.   Georgianne Fick, MD

## 2021-10-12 ENCOUNTER — Encounter: Payer: 59 | Admitting: Physical Therapy

## 2021-10-12 ENCOUNTER — Encounter (HOSPITAL_BASED_OUTPATIENT_CLINIC_OR_DEPARTMENT_OTHER): Payer: Self-pay | Admitting: General Surgery

## 2021-10-12 ENCOUNTER — Ambulatory Visit
Admission: RE | Admit: 2021-10-12 | Discharge: 2021-10-12 | Disposition: A | Discharge status: Home / Self Care | Payer: 59 | Source: Ambulatory Visit | Attending: Radiation Oncology | Admitting: Radiation Oncology

## 2021-10-12 DIAGNOSIS — Z510 Encounter for antineoplastic radiation therapy: Secondary | ICD-10-CM | POA: Diagnosis not present

## 2021-10-12 DIAGNOSIS — C50412 Malignant neoplasm of upper-outer quadrant of left female breast: Secondary | ICD-10-CM | POA: Diagnosis not present

## 2021-10-12 DIAGNOSIS — C50511 Malignant neoplasm of lower-outer quadrant of right female breast: Secondary | ICD-10-CM | POA: Diagnosis not present

## 2021-10-13 ENCOUNTER — Ambulatory Visit
Admission: RE | Admit: 2021-10-13 | Discharge: 2021-10-13 | Disposition: A | Discharge status: Home / Self Care | Payer: 59 | Source: Ambulatory Visit | Attending: Radiation Oncology | Admitting: Radiation Oncology

## 2021-10-13 ENCOUNTER — Other Ambulatory Visit: Payer: Self-pay

## 2021-10-13 DIAGNOSIS — Z510 Encounter for antineoplastic radiation therapy: Secondary | ICD-10-CM | POA: Diagnosis not present

## 2021-10-13 DIAGNOSIS — C50412 Malignant neoplasm of upper-outer quadrant of left female breast: Secondary | ICD-10-CM | POA: Diagnosis not present

## 2021-10-13 DIAGNOSIS — C50511 Malignant neoplasm of lower-outer quadrant of right female breast: Secondary | ICD-10-CM | POA: Diagnosis not present

## 2021-10-16 ENCOUNTER — Other Ambulatory Visit: Payer: Self-pay

## 2021-10-16 ENCOUNTER — Ambulatory Visit
Admission: RE | Admit: 2021-10-16 | Discharge: 2021-10-16 | Disposition: A | Discharge status: Home / Self Care | Payer: 59 | Source: Ambulatory Visit | Attending: Radiation Oncology | Admitting: Radiation Oncology

## 2021-10-16 DIAGNOSIS — C50511 Malignant neoplasm of lower-outer quadrant of right female breast: Secondary | ICD-10-CM | POA: Diagnosis not present

## 2021-10-16 DIAGNOSIS — Z510 Encounter for antineoplastic radiation therapy: Secondary | ICD-10-CM | POA: Diagnosis not present

## 2021-10-16 DIAGNOSIS — C50412 Malignant neoplasm of upper-outer quadrant of left female breast: Secondary | ICD-10-CM | POA: Diagnosis not present

## 2021-10-17 ENCOUNTER — Other Ambulatory Visit: Payer: Self-pay

## 2021-10-17 ENCOUNTER — Ambulatory Visit
Admission: RE | Admit: 2021-10-17 | Discharge: 2021-10-17 | Disposition: A | Discharge status: Home / Self Care | Payer: 59 | Source: Ambulatory Visit | Attending: Radiation Oncology | Admitting: Radiation Oncology

## 2021-10-17 DIAGNOSIS — C50412 Malignant neoplasm of upper-outer quadrant of left female breast: Secondary | ICD-10-CM | POA: Diagnosis not present

## 2021-10-17 DIAGNOSIS — C50511 Malignant neoplasm of lower-outer quadrant of right female breast: Secondary | ICD-10-CM | POA: Diagnosis not present

## 2021-10-17 DIAGNOSIS — Z510 Encounter for antineoplastic radiation therapy: Secondary | ICD-10-CM | POA: Diagnosis not present

## 2021-10-18 ENCOUNTER — Other Ambulatory Visit: Payer: Self-pay

## 2021-10-18 ENCOUNTER — Ambulatory Visit
Admission: RE | Admit: 2021-10-18 | Discharge: 2021-10-18 | Disposition: A | Discharge status: Home / Self Care | Payer: 59 | Source: Ambulatory Visit | Attending: Radiation Oncology | Admitting: Radiation Oncology

## 2021-10-18 DIAGNOSIS — Z510 Encounter for antineoplastic radiation therapy: Secondary | ICD-10-CM | POA: Diagnosis not present

## 2021-10-18 DIAGNOSIS — C50412 Malignant neoplasm of upper-outer quadrant of left female breast: Secondary | ICD-10-CM | POA: Diagnosis not present

## 2021-10-18 DIAGNOSIS — C50511 Malignant neoplasm of lower-outer quadrant of right female breast: Secondary | ICD-10-CM | POA: Diagnosis not present

## 2021-10-19 ENCOUNTER — Ambulatory Visit
Admission: RE | Admit: 2021-10-19 | Discharge: 2021-10-19 | Disposition: A | Discharge status: Home / Self Care | Payer: 59 | Source: Ambulatory Visit | Attending: Radiation Oncology | Admitting: Radiation Oncology

## 2021-10-19 ENCOUNTER — Ambulatory Visit: Payer: 59

## 2021-10-19 ENCOUNTER — Other Ambulatory Visit: Payer: 59

## 2021-10-19 DIAGNOSIS — C50511 Malignant neoplasm of lower-outer quadrant of right female breast: Secondary | ICD-10-CM | POA: Diagnosis not present

## 2021-10-19 DIAGNOSIS — C50412 Malignant neoplasm of upper-outer quadrant of left female breast: Secondary | ICD-10-CM | POA: Diagnosis not present

## 2021-10-19 DIAGNOSIS — Z510 Encounter for antineoplastic radiation therapy: Secondary | ICD-10-CM | POA: Diagnosis not present

## 2021-10-20 ENCOUNTER — Ambulatory Visit
Admission: RE | Admit: 2021-10-20 | Discharge: 2021-10-20 | Disposition: A | Discharge status: Home / Self Care | Payer: 59 | Source: Ambulatory Visit | Attending: Radiation Oncology | Admitting: Radiation Oncology

## 2021-10-20 ENCOUNTER — Other Ambulatory Visit: Payer: Self-pay

## 2021-10-20 DIAGNOSIS — C50511 Malignant neoplasm of lower-outer quadrant of right female breast: Secondary | ICD-10-CM | POA: Diagnosis not present

## 2021-10-20 DIAGNOSIS — Z510 Encounter for antineoplastic radiation therapy: Secondary | ICD-10-CM | POA: Diagnosis not present

## 2021-10-20 DIAGNOSIS — C50412 Malignant neoplasm of upper-outer quadrant of left female breast: Secondary | ICD-10-CM | POA: Diagnosis not present

## 2021-10-23 ENCOUNTER — Ambulatory Visit
Admission: RE | Admit: 2021-10-23 | Discharge: 2021-10-23 | Disposition: A | Discharge status: Home / Self Care | Payer: 59 | Source: Ambulatory Visit | Attending: Radiation Oncology | Admitting: Radiation Oncology

## 2021-10-23 ENCOUNTER — Other Ambulatory Visit: Payer: Self-pay

## 2021-10-23 DIAGNOSIS — Z510 Encounter for antineoplastic radiation therapy: Secondary | ICD-10-CM | POA: Diagnosis not present

## 2021-10-23 DIAGNOSIS — C50412 Malignant neoplasm of upper-outer quadrant of left female breast: Secondary | ICD-10-CM | POA: Diagnosis not present

## 2021-10-23 DIAGNOSIS — C50511 Malignant neoplasm of lower-outer quadrant of right female breast: Secondary | ICD-10-CM | POA: Diagnosis not present

## 2021-10-24 ENCOUNTER — Ambulatory Visit
Admission: RE | Admit: 2021-10-24 | Discharge: 2021-10-24 | Disposition: A | Discharge status: Home / Self Care | Payer: 59 | Source: Ambulatory Visit | Attending: Radiation Oncology | Admitting: Radiation Oncology

## 2021-10-24 ENCOUNTER — Other Ambulatory Visit: Payer: Self-pay

## 2021-10-24 DIAGNOSIS — C50511 Malignant neoplasm of lower-outer quadrant of right female breast: Secondary | ICD-10-CM | POA: Diagnosis not present

## 2021-10-24 DIAGNOSIS — C50412 Malignant neoplasm of upper-outer quadrant of left female breast: Secondary | ICD-10-CM | POA: Diagnosis not present

## 2021-10-24 DIAGNOSIS — Z510 Encounter for antineoplastic radiation therapy: Secondary | ICD-10-CM | POA: Diagnosis not present

## 2021-10-25 ENCOUNTER — Ambulatory Visit: Admission: RE | Admit: 2021-10-25 | Payer: 59 | Source: Ambulatory Visit

## 2021-10-26 ENCOUNTER — Ambulatory Visit
Admission: RE | Admit: 2021-10-26 | Discharge: 2021-10-26 | Disposition: A | Discharge status: Home / Self Care | Payer: 59 | Source: Ambulatory Visit | Attending: Radiation Oncology | Admitting: Radiation Oncology

## 2021-10-26 DIAGNOSIS — Z510 Encounter for antineoplastic radiation therapy: Secondary | ICD-10-CM | POA: Diagnosis not present

## 2021-10-26 DIAGNOSIS — C50511 Malignant neoplasm of lower-outer quadrant of right female breast: Secondary | ICD-10-CM | POA: Diagnosis not present

## 2021-10-26 DIAGNOSIS — J9601 Acute respiratory failure with hypoxia: Secondary | ICD-10-CM | POA: Diagnosis not present

## 2021-10-26 DIAGNOSIS — C50412 Malignant neoplasm of upper-outer quadrant of left female breast: Secondary | ICD-10-CM | POA: Diagnosis not present

## 2021-10-27 ENCOUNTER — Ambulatory Visit
Admission: RE | Admit: 2021-10-27 | Discharge: 2021-10-27 | Disposition: A | Discharge status: Home / Self Care | Payer: 59 | Source: Ambulatory Visit | Attending: Radiation Oncology | Admitting: Radiation Oncology

## 2021-10-27 DIAGNOSIS — C50412 Malignant neoplasm of upper-outer quadrant of left female breast: Secondary | ICD-10-CM | POA: Diagnosis not present

## 2021-10-27 DIAGNOSIS — Z510 Encounter for antineoplastic radiation therapy: Secondary | ICD-10-CM | POA: Diagnosis not present

## 2021-10-27 DIAGNOSIS — C50511 Malignant neoplasm of lower-outer quadrant of right female breast: Secondary | ICD-10-CM | POA: Diagnosis not present

## 2021-10-30 ENCOUNTER — Ambulatory Visit: Payer: 59

## 2021-10-31 ENCOUNTER — Other Ambulatory Visit: Payer: Self-pay

## 2021-10-31 ENCOUNTER — Ambulatory Visit
Admission: RE | Admit: 2021-10-31 | Discharge: 2021-10-31 | Disposition: A | Discharge status: Home / Self Care | Payer: 59 | Source: Ambulatory Visit | Attending: Radiation Oncology | Admitting: Radiation Oncology

## 2021-10-31 DIAGNOSIS — Z510 Encounter for antineoplastic radiation therapy: Secondary | ICD-10-CM | POA: Diagnosis not present

## 2021-10-31 DIAGNOSIS — C50412 Malignant neoplasm of upper-outer quadrant of left female breast: Secondary | ICD-10-CM | POA: Diagnosis not present

## 2021-10-31 DIAGNOSIS — C50511 Malignant neoplasm of lower-outer quadrant of right female breast: Secondary | ICD-10-CM | POA: Diagnosis not present

## 2021-11-01 ENCOUNTER — Ambulatory Visit
Admission: RE | Admit: 2021-11-01 | Discharge: 2021-11-01 | Disposition: A | Discharge status: Home / Self Care | Payer: 59 | Source: Ambulatory Visit | Attending: Radiation Oncology | Admitting: Radiation Oncology

## 2021-11-01 ENCOUNTER — Other Ambulatory Visit: Payer: Self-pay

## 2021-11-01 DIAGNOSIS — C50812 Malignant neoplasm of overlapping sites of left female breast: Secondary | ICD-10-CM | POA: Diagnosis not present

## 2021-11-01 DIAGNOSIS — C50412 Malignant neoplasm of upper-outer quadrant of left female breast: Secondary | ICD-10-CM | POA: Diagnosis not present

## 2021-11-01 DIAGNOSIS — Z171 Estrogen receptor negative status [ER-]: Secondary | ICD-10-CM | POA: Diagnosis not present

## 2021-11-01 DIAGNOSIS — C50911 Malignant neoplasm of unspecified site of right female breast: Secondary | ICD-10-CM | POA: Insufficient documentation

## 2021-11-01 DIAGNOSIS — C50912 Malignant neoplasm of unspecified site of left female breast: Secondary | ICD-10-CM | POA: Diagnosis not present

## 2021-11-02 ENCOUNTER — Other Ambulatory Visit: Payer: Self-pay

## 2021-11-02 ENCOUNTER — Ambulatory Visit
Admission: RE | Admit: 2021-11-02 | Discharge: 2021-11-02 | Disposition: A | Discharge status: Home / Self Care | Payer: 59 | Source: Ambulatory Visit | Attending: Radiation Oncology | Admitting: Radiation Oncology

## 2021-11-02 DIAGNOSIS — Z171 Estrogen receptor negative status [ER-]: Secondary | ICD-10-CM | POA: Diagnosis not present

## 2021-11-02 DIAGNOSIS — C50412 Malignant neoplasm of upper-outer quadrant of left female breast: Secondary | ICD-10-CM | POA: Diagnosis not present

## 2021-11-02 DIAGNOSIS — C50911 Malignant neoplasm of unspecified site of right female breast: Secondary | ICD-10-CM | POA: Diagnosis not present

## 2021-11-02 DIAGNOSIS — C50912 Malignant neoplasm of unspecified site of left female breast: Secondary | ICD-10-CM | POA: Diagnosis not present

## 2021-11-02 DIAGNOSIS — C50812 Malignant neoplasm of overlapping sites of left female breast: Secondary | ICD-10-CM | POA: Diagnosis not present

## 2021-11-03 ENCOUNTER — Other Ambulatory Visit: Payer: Self-pay

## 2021-11-03 ENCOUNTER — Ambulatory Visit
Admission: RE | Admit: 2021-11-03 | Discharge: 2021-11-03 | Disposition: A | Discharge status: Home / Self Care | Payer: 59 | Source: Ambulatory Visit | Attending: Radiation Oncology | Admitting: Radiation Oncology

## 2021-11-03 DIAGNOSIS — C50912 Malignant neoplasm of unspecified site of left female breast: Secondary | ICD-10-CM | POA: Diagnosis not present

## 2021-11-03 DIAGNOSIS — Z171 Estrogen receptor negative status [ER-]: Secondary | ICD-10-CM | POA: Diagnosis not present

## 2021-11-03 DIAGNOSIS — C50412 Malignant neoplasm of upper-outer quadrant of left female breast: Secondary | ICD-10-CM | POA: Diagnosis not present

## 2021-11-03 DIAGNOSIS — C50812 Malignant neoplasm of overlapping sites of left female breast: Secondary | ICD-10-CM | POA: Diagnosis not present

## 2021-11-03 DIAGNOSIS — C50911 Malignant neoplasm of unspecified site of right female breast: Secondary | ICD-10-CM | POA: Diagnosis not present

## 2021-11-06 ENCOUNTER — Ambulatory Visit
Admission: RE | Admit: 2021-11-06 | Discharge: 2021-11-06 | Disposition: A | Discharge status: Home / Self Care | Payer: 59 | Source: Ambulatory Visit | Attending: Radiation Oncology | Admitting: Radiation Oncology

## 2021-11-06 ENCOUNTER — Other Ambulatory Visit: Payer: Self-pay

## 2021-11-06 DIAGNOSIS — C50911 Malignant neoplasm of unspecified site of right female breast: Secondary | ICD-10-CM | POA: Diagnosis not present

## 2021-11-06 DIAGNOSIS — C50812 Malignant neoplasm of overlapping sites of left female breast: Secondary | ICD-10-CM | POA: Diagnosis not present

## 2021-11-06 DIAGNOSIS — C50412 Malignant neoplasm of upper-outer quadrant of left female breast: Secondary | ICD-10-CM | POA: Diagnosis not present

## 2021-11-06 DIAGNOSIS — C50912 Malignant neoplasm of unspecified site of left female breast: Secondary | ICD-10-CM | POA: Diagnosis not present

## 2021-11-06 DIAGNOSIS — Z171 Estrogen receptor negative status [ER-]: Secondary | ICD-10-CM | POA: Diagnosis not present

## 2021-11-07 ENCOUNTER — Other Ambulatory Visit: Payer: Self-pay

## 2021-11-07 ENCOUNTER — Ambulatory Visit
Admission: RE | Admit: 2021-11-07 | Discharge: 2021-11-07 | Disposition: A | Discharge status: Home / Self Care | Payer: 59 | Source: Ambulatory Visit | Attending: Radiation Oncology | Admitting: Radiation Oncology

## 2021-11-07 DIAGNOSIS — C50812 Malignant neoplasm of overlapping sites of left female breast: Secondary | ICD-10-CM | POA: Diagnosis not present

## 2021-11-07 DIAGNOSIS — C50911 Malignant neoplasm of unspecified site of right female breast: Secondary | ICD-10-CM | POA: Diagnosis not present

## 2021-11-07 DIAGNOSIS — Z171 Estrogen receptor negative status [ER-]: Secondary | ICD-10-CM | POA: Diagnosis not present

## 2021-11-07 DIAGNOSIS — C50912 Malignant neoplasm of unspecified site of left female breast: Secondary | ICD-10-CM | POA: Diagnosis not present

## 2021-11-07 DIAGNOSIS — C50412 Malignant neoplasm of upper-outer quadrant of left female breast: Secondary | ICD-10-CM | POA: Diagnosis not present

## 2021-11-08 ENCOUNTER — Ambulatory Visit: Payer: 59

## 2021-11-09 ENCOUNTER — Ambulatory Visit: Payer: 59

## 2021-11-10 ENCOUNTER — Ambulatory Visit: Payer: 59

## 2021-11-10 ENCOUNTER — Ambulatory Visit: Payer: 59 | Admitting: Radiation Oncology

## 2021-11-13 ENCOUNTER — Other Ambulatory Visit: Payer: Self-pay

## 2021-11-13 ENCOUNTER — Ambulatory Visit
Admission: RE | Admit: 2021-11-13 | Discharge: 2021-11-13 | Disposition: A | Discharge status: Home / Self Care | Payer: 59 | Source: Ambulatory Visit | Attending: Radiation Oncology | Admitting: Radiation Oncology

## 2021-11-13 ENCOUNTER — Ambulatory Visit: Payer: 59 | Attending: General Surgery

## 2021-11-13 VITALS — Wt 254.5 lb

## 2021-11-13 DIAGNOSIS — C50911 Malignant neoplasm of unspecified site of right female breast: Secondary | ICD-10-CM | POA: Diagnosis not present

## 2021-11-13 DIAGNOSIS — C50812 Malignant neoplasm of overlapping sites of left female breast: Secondary | ICD-10-CM | POA: Diagnosis not present

## 2021-11-13 DIAGNOSIS — C50912 Malignant neoplasm of unspecified site of left female breast: Secondary | ICD-10-CM | POA: Diagnosis not present

## 2021-11-13 DIAGNOSIS — Z483 Aftercare following surgery for neoplasm: Secondary | ICD-10-CM | POA: Insufficient documentation

## 2021-11-13 DIAGNOSIS — Z171 Estrogen receptor negative status [ER-]: Secondary | ICD-10-CM | POA: Diagnosis not present

## 2021-11-13 DIAGNOSIS — C50412 Malignant neoplasm of upper-outer quadrant of left female breast: Secondary | ICD-10-CM | POA: Diagnosis not present

## 2021-11-13 NOTE — Therapy (Signed)
[facility] @ [address] [place], [place], [address] Phone: [phone]   Fax:  [phone]  Physical Therapy Treatment  Patient Details  Name: Leslie Wright MRN: [id number] Date of Birth: [birth year] Referring Provider (PT): Magrinat   Encounter Date: 11/13/2021   PT End of Session - 11/13/21 1510     Visit Number 5   # unchanged due to screen only   PT Start Time 1507    PT Stop Time 3536    PT Time Calculation (min) 10 min    Activity Tolerance Patient tolerated treatment well    Behavior During Therapy [facility] for tasks assessed/performed             Past Medical History:  Diagnosis Date   Acid reflux    Anemia    Arthritis    "maybe in my fingers" (03/26/2018)   Breast cancer ([facility]) 12/2020   both sides   Concussion 2001   no deficit had tia right after no problems since   Family history of adverse reaction to anesthesia    "brother, Timmy, and my mom always get sick" (03/26/2018)   Family history of brain cancer    Family history of breast cancer    Family history of colon cancer    Family history of lung cancer    Family history of pancreatic cancer    Family history of prostate cancer    Headache    High blood pressure    High cholesterol    Hip pain    "left; before OR" (03/26/2018)   History of chemotherapy last done 02-17-2021   History of hiatal hernia    History of kidney stones    OSA (obstructive sleep apnea)    "can't tolerate the mask" (03/26/2018)    PONV (postoperative nausea and vomiting)    does not need much anesthesia, slow to awlaekn in past   Pre-diabetes    Shortness of breath    with exertion   TIA (transient ischemic attack)    15 years ago   Wears partial dentures upper    Past Surgical History:  Procedure Laterality Date   CARPAL TUNNEL RELEASE Right ~ 2006   JOINT REPLACEMENT Left    total left hip   LAPAROSCOPIC CHOLECYSTECTOMY  12/2010   LASIK Bilateral 2000   PORT  A CATH REVISION Left 02/21/2021   Procedure: PORT A CATH REVISION;  Surgeon: Stark Klein, MD;  Location: [facility];  Service: General;  Laterality: Left;  60 MINUTES ROOM 5   PORT-A-CATH REMOVAL Left 10/11/2021   Procedure: PORT REMOVAL;  Surgeon: Stark Klein, MD;  Location: [facility];  Service: General;  Laterality: Left;   PORTACATH PLACEMENT N/A 02/08/2021   Procedure: INSERTION PORT-A-CATH;  Surgeon: Stark Klein, MD;  Location: [facility];  Service: General;  Laterality: N/A;   RADIOACTIVE SEED GUIDED AXILLARY SENTINEL LYMPH NODE Right 08/08/2021   Procedure: RADIOACTIVE SEED GUIDED RIGHT AXILLARY SENTINEL LYMPH NODE BIOPSY;  Surgeon: Stark Klein, MD;  Location: [facility];  Service: General;  Laterality: Right;   SENTINEL NODE BIOPSY Bilateral 08/08/2021   Procedure: BILATERAL SENTINEL NODE BIOPSY;  Surgeon: Stark Klein, MD;  Location: [facility];  Service: General;  Laterality: Bilateral;   TOTAL HIP ARTHROPLASTY Left 03/25/2018   Procedure: LEFT TOTAL HIP ARTHROPLASTY ANTERIOR APPROACH;  Surgeon: Mcarthur Rossetti, MD;  Location: [facility];  Service: Orthopedics;  Laterality: Left;   TOTAL MASTECTOMY Bilateral 08/08/2021  Procedure: BILATERAL TOTAL MASTECTOMY;  Surgeon: Stark Klein, MD;  Location: [facility];  Service: General;  Laterality: Bilateral;    Vitals:   11/13/21 1509  Weight: 254 lb 8 oz (115.4 kg)     Subjective Assessment - 11/13/21 1510     Subjective Pt returns for her 3 month L-Dex screen.    Pertinent History Bilateral breast cancer - R breast IDC grade 2 ER+ Her2-; L breast IDC grade 3 DCIS triple negative, completed neoadjuvant chemo, ; bilateral mastectomy 08/08/21 R SLNB (0/2), L SLNB (0/4), will begin radiation                    L-DEX FLOWSHEETS - 11/13/21 1500       L-DEX LYMPHEDEMA SCREENING   Measurement Type Bilateral    L-DEX MEASUREMENT EXTREMITY Upper Extremity    POSITION  Standing    DOMINANT SIDE Right    At  Risk Side --   bil   BASELINE RIGHT -2.3    BASELINE LEFT -1.9    RIGHT SIDE SCORE -2.6    LEFT SIDE SCORE -3.9    VALUE CHANGE RIGHT -0.3    VALUE CHANGE LEFT -2                                     PT Long Term Goals - 09/27/21 1443       PT LONG TERM GOAL #1   Title Pt will return to baseline shoulder ROM measurements and not demonstrate any signs or symptoms of lymphedema    Status Achieved      PT LONG TERM GOAL #2   Title Pt will demonstrate 153 degrees of bilateral shoulder flexion to allow her to reach overhead    Status Achieved      PT LONG TERM GOAL #3   Title Pt will demonstrate 170 degrees of bilateral shoulder abduction to allow pt to reach out to the side    Status Achieved                   Plan - 11/13/21 1517     Clinical Impression Statement Pt returns for her first 3 month L-Dex screen. Her changes from baseline of Rt UE -0.3 and Lt of -2 are WNLs so no further treatment is required at this time except to cont every 3 month L-Dex screens which pt is agreeable to.    PT Next Visit Plan Cont every 3 month L-Dex screens for up to 2 years from her bil SLNBs (~08/29/2023)    Consulted and Agree with Plan of Care Patient             Patient will benefit from skilled therapeutic intervention in order to improve the following deficits and impairments:     Visit Diagnosis: Aftercare following surgery for neoplasm     Problem List Patient Active Problem List   Diagnosis Date Noted   Bilateral breast cancer ([facility]) 08/08/2021   CKD (chronic kidney disease) stage 3, GFR 30-59 ml/min ([place]) 04/21/2021   Port-A-Cath in place 03/09/2021   Encounter for antineoplastic chemotherapy 02/17/2021   Encounter for immunotherapy 02/17/2021   Genetic testing 02/14/2021   Hepatic steatosis 02/09/2021   Multilevel degenerative disc disease 02/09/2021   Family history of breast cancer    Family history of pancreatic cancer    Family  history of colon cancer    Family history  of lung cancer    Family history of prostate cancer    Family history of brain cancer    Malignant neoplasm of overlapping sites of left breast in female, estrogen receptor negative ([facility]) 01/25/2021   Malignant neoplasm of lower-outer quadrant of right breast of female, estrogen receptor positive ([facility]) 01/23/2021   Status post left hip replacement 05/19/2018   Acute deep vein thrombosis (DVT) of distal end of right lower extremity ([facility]) 05/19/2018   Unilateral primary osteoarthritis, left hip 03/25/2018   Status post total replacement of left hip 03/25/2018   Hyperlipidemia 04/11/2017   Class 3 obesity with serious comorbidity and body mass index (BMI) of 40.0 to 44.9 in adult 04/11/2017   Prediabetes 02/04/2017   Mixed hyperlipidemia 01/09/2017   Insulin resistance 11/27/2016   Essential hypertension 10/25/2016   Morbid obesity with BMI of 40.0-44.9, adult ([facility]) 10/25/2016   Vitamin D deficiency 10/25/2016    Otelia Limes, PTA 11/13/2021, 3:19 PM  [facility] @ [address] [place], [place], [address] Phone: [phone]   Fax:  [phone]  Name: Leslie Wright MRN: [id number] Date of Birth: 26-Nov-1965

## 2021-11-14 ENCOUNTER — Telehealth: Payer: Self-pay | Admitting: Hematology and Oncology

## 2021-11-14 ENCOUNTER — Ambulatory Visit
Admission: RE | Admit: 2021-11-14 | Discharge: 2021-11-14 | Disposition: A | Discharge status: Home / Self Care | Payer: 59 | Source: Ambulatory Visit | Attending: Radiation Oncology | Admitting: Radiation Oncology

## 2021-11-14 ENCOUNTER — Ambulatory Visit: Payer: 59

## 2021-11-14 DIAGNOSIS — C50911 Malignant neoplasm of unspecified site of right female breast: Secondary | ICD-10-CM

## 2021-11-14 DIAGNOSIS — Z171 Estrogen receptor negative status [ER-]: Secondary | ICD-10-CM | POA: Diagnosis not present

## 2021-11-14 DIAGNOSIS — C50912 Malignant neoplasm of unspecified site of left female breast: Secondary | ICD-10-CM | POA: Diagnosis not present

## 2021-11-14 DIAGNOSIS — C50812 Malignant neoplasm of overlapping sites of left female breast: Secondary | ICD-10-CM | POA: Diagnosis not present

## 2021-11-14 DIAGNOSIS — C50412 Malignant neoplasm of upper-outer quadrant of left female breast: Secondary | ICD-10-CM | POA: Diagnosis not present

## 2021-11-14 MED ORDER — SONAFINE EX EMUL
1.0000 "application " | Freq: Once | CUTANEOUS | Status: AC
  Administered 2021-11-14: 1 via TOPICAL

## 2021-11-14 NOTE — Telephone Encounter (Signed)
Sch per 2/13 inbasket, pt aware

## 2021-11-15 ENCOUNTER — Ambulatory Visit: Payer: 59

## 2021-11-15 ENCOUNTER — Ambulatory Visit
Admission: RE | Admit: 2021-11-15 | Discharge: 2021-11-15 | Disposition: A | Discharge status: Home / Self Care | Payer: 59 | Source: Ambulatory Visit | Attending: Radiation Oncology | Admitting: Radiation Oncology

## 2021-11-15 ENCOUNTER — Other Ambulatory Visit: Payer: Self-pay

## 2021-11-15 DIAGNOSIS — Z171 Estrogen receptor negative status [ER-]: Secondary | ICD-10-CM | POA: Diagnosis not present

## 2021-11-15 DIAGNOSIS — C50912 Malignant neoplasm of unspecified site of left female breast: Secondary | ICD-10-CM | POA: Diagnosis not present

## 2021-11-15 DIAGNOSIS — C50911 Malignant neoplasm of unspecified site of right female breast: Secondary | ICD-10-CM | POA: Diagnosis not present

## 2021-11-15 DIAGNOSIS — C50812 Malignant neoplasm of overlapping sites of left female breast: Secondary | ICD-10-CM | POA: Diagnosis not present

## 2021-11-15 DIAGNOSIS — C50412 Malignant neoplasm of upper-outer quadrant of left female breast: Secondary | ICD-10-CM | POA: Diagnosis not present

## 2021-11-16 ENCOUNTER — Ambulatory Visit: Payer: 59

## 2021-11-16 DIAGNOSIS — Z171 Estrogen receptor negative status [ER-]: Secondary | ICD-10-CM | POA: Diagnosis not present

## 2021-11-16 DIAGNOSIS — C50911 Malignant neoplasm of unspecified site of right female breast: Secondary | ICD-10-CM | POA: Diagnosis not present

## 2021-11-16 DIAGNOSIS — C50412 Malignant neoplasm of upper-outer quadrant of left female breast: Secondary | ICD-10-CM | POA: Diagnosis not present

## 2021-11-16 DIAGNOSIS — C50812 Malignant neoplasm of overlapping sites of left female breast: Secondary | ICD-10-CM | POA: Diagnosis not present

## 2021-11-16 DIAGNOSIS — C50912 Malignant neoplasm of unspecified site of left female breast: Secondary | ICD-10-CM | POA: Diagnosis not present

## 2021-11-17 ENCOUNTER — Other Ambulatory Visit: Payer: Self-pay

## 2021-11-17 ENCOUNTER — Ambulatory Visit
Admission: RE | Admit: 2021-11-17 | Discharge: 2021-11-17 | Disposition: A | Discharge status: Home / Self Care | Payer: 59 | Source: Ambulatory Visit | Attending: Radiation Oncology | Admitting: Radiation Oncology

## 2021-11-17 ENCOUNTER — Ambulatory Visit: Payer: 59 | Admitting: Radiation Oncology

## 2021-11-17 DIAGNOSIS — C50912 Malignant neoplasm of unspecified site of left female breast: Secondary | ICD-10-CM | POA: Diagnosis not present

## 2021-11-17 DIAGNOSIS — C50911 Malignant neoplasm of unspecified site of right female breast: Secondary | ICD-10-CM | POA: Diagnosis not present

## 2021-11-17 DIAGNOSIS — C50812 Malignant neoplasm of overlapping sites of left female breast: Secondary | ICD-10-CM | POA: Diagnosis not present

## 2021-11-17 DIAGNOSIS — Z171 Estrogen receptor negative status [ER-]: Secondary | ICD-10-CM | POA: Diagnosis not present

## 2021-11-17 DIAGNOSIS — C50412 Malignant neoplasm of upper-outer quadrant of left female breast: Secondary | ICD-10-CM | POA: Diagnosis not present

## 2021-11-20 ENCOUNTER — Encounter: Payer: Self-pay | Admitting: *Deleted

## 2021-11-20 ENCOUNTER — Other Ambulatory Visit: Payer: Self-pay

## 2021-11-20 ENCOUNTER — Ambulatory Visit: Payer: 59

## 2021-11-20 DIAGNOSIS — C50812 Malignant neoplasm of overlapping sites of left female breast: Secondary | ICD-10-CM | POA: Diagnosis not present

## 2021-11-20 DIAGNOSIS — C50912 Malignant neoplasm of unspecified site of left female breast: Secondary | ICD-10-CM | POA: Diagnosis not present

## 2021-11-20 DIAGNOSIS — C50911 Malignant neoplasm of unspecified site of right female breast: Secondary | ICD-10-CM | POA: Diagnosis not present

## 2021-11-20 DIAGNOSIS — Z171 Estrogen receptor negative status [ER-]: Secondary | ICD-10-CM | POA: Diagnosis not present

## 2021-11-20 DIAGNOSIS — C50412 Malignant neoplasm of upper-outer quadrant of left female breast: Secondary | ICD-10-CM | POA: Diagnosis not present

## 2021-11-21 ENCOUNTER — Ambulatory Visit
Admission: RE | Admit: 2021-11-21 | Discharge: 2021-11-21 | Disposition: A | Discharge status: Home / Self Care | Payer: 59 | Source: Ambulatory Visit | Attending: Radiation Oncology | Admitting: Radiation Oncology

## 2021-11-21 ENCOUNTER — Ambulatory Visit: Payer: 59

## 2021-11-21 ENCOUNTER — Other Ambulatory Visit: Payer: 59

## 2021-11-21 ENCOUNTER — Ambulatory Visit: Payer: 59 | Admitting: Hematology and Oncology

## 2021-11-21 DIAGNOSIS — C50812 Malignant neoplasm of overlapping sites of left female breast: Secondary | ICD-10-CM | POA: Diagnosis not present

## 2021-11-21 DIAGNOSIS — C50912 Malignant neoplasm of unspecified site of left female breast: Secondary | ICD-10-CM | POA: Diagnosis not present

## 2021-11-21 DIAGNOSIS — C50911 Malignant neoplasm of unspecified site of right female breast: Secondary | ICD-10-CM | POA: Diagnosis not present

## 2021-11-21 DIAGNOSIS — Z171 Estrogen receptor negative status [ER-]: Secondary | ICD-10-CM | POA: Diagnosis not present

## 2021-11-21 DIAGNOSIS — C50412 Malignant neoplasm of upper-outer quadrant of left female breast: Secondary | ICD-10-CM | POA: Diagnosis not present

## 2021-11-22 ENCOUNTER — Ambulatory Visit: Payer: 59

## 2021-11-22 ENCOUNTER — Other Ambulatory Visit: Payer: Self-pay

## 2021-11-22 ENCOUNTER — Ambulatory Visit
Admission: RE | Admit: 2021-11-22 | Discharge: 2021-11-22 | Disposition: A | Discharge status: Home / Self Care | Payer: 59 | Source: Ambulatory Visit | Attending: Radiation Oncology | Admitting: Radiation Oncology

## 2021-11-22 DIAGNOSIS — C50912 Malignant neoplasm of unspecified site of left female breast: Secondary | ICD-10-CM | POA: Diagnosis not present

## 2021-11-22 DIAGNOSIS — Z171 Estrogen receptor negative status [ER-]: Secondary | ICD-10-CM | POA: Diagnosis not present

## 2021-11-22 DIAGNOSIS — C50911 Malignant neoplasm of unspecified site of right female breast: Secondary | ICD-10-CM | POA: Diagnosis not present

## 2021-11-22 DIAGNOSIS — C50412 Malignant neoplasm of upper-outer quadrant of left female breast: Secondary | ICD-10-CM | POA: Diagnosis not present

## 2021-11-22 DIAGNOSIS — C50812 Malignant neoplasm of overlapping sites of left female breast: Secondary | ICD-10-CM | POA: Diagnosis not present

## 2021-11-22 NOTE — Progress Notes (Signed)
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 Leslie Wright

## 2021-11-23 ENCOUNTER — Encounter (HOSPITAL_COMMUNITY): Payer: Self-pay

## 2021-11-23 ENCOUNTER — Ambulatory Visit
Admission: RE | Admit: 2021-11-23 | Discharge: 2021-11-23 | Disposition: A | Discharge status: Home / Self Care | Payer: 59 | Source: Ambulatory Visit | Attending: Radiation Oncology | Admitting: Radiation Oncology

## 2021-11-23 ENCOUNTER — Ambulatory Visit: Payer: 59

## 2021-11-23 DIAGNOSIS — Z171 Estrogen receptor negative status [ER-]: Secondary | ICD-10-CM | POA: Diagnosis not present

## 2021-11-23 DIAGNOSIS — C50912 Malignant neoplasm of unspecified site of left female breast: Secondary | ICD-10-CM | POA: Diagnosis not present

## 2021-11-23 DIAGNOSIS — C50812 Malignant neoplasm of overlapping sites of left female breast: Secondary | ICD-10-CM | POA: Diagnosis not present

## 2021-11-23 DIAGNOSIS — C50911 Malignant neoplasm of unspecified site of right female breast: Secondary | ICD-10-CM | POA: Diagnosis not present

## 2021-11-24 ENCOUNTER — Ambulatory Visit: Payer: 59

## 2021-11-24 ENCOUNTER — Ambulatory Visit
Admission: RE | Admit: 2021-11-24 | Discharge: 2021-11-24 | Disposition: A | Discharge status: Home / Self Care | Payer: 59 | Source: Ambulatory Visit | Attending: Radiation Oncology | Admitting: Radiation Oncology

## 2021-11-24 ENCOUNTER — Other Ambulatory Visit: Payer: Self-pay

## 2021-11-24 DIAGNOSIS — C50912 Malignant neoplasm of unspecified site of left female breast: Secondary | ICD-10-CM | POA: Diagnosis not present

## 2021-11-24 DIAGNOSIS — C50812 Malignant neoplasm of overlapping sites of left female breast: Secondary | ICD-10-CM | POA: Diagnosis not present

## 2021-11-24 DIAGNOSIS — C50911 Malignant neoplasm of unspecified site of right female breast: Secondary | ICD-10-CM | POA: Diagnosis not present

## 2021-11-24 DIAGNOSIS — Z171 Estrogen receptor negative status [ER-]: Secondary | ICD-10-CM

## 2021-11-24 MED ORDER — RADIAPLEXRX EX GEL: Freq: Once | CUTANEOUS | Status: AC

## 2021-11-26 DIAGNOSIS — J9601 Acute respiratory failure with hypoxia: Secondary | ICD-10-CM | POA: Diagnosis not present

## 2021-11-27 ENCOUNTER — Encounter: Payer: Self-pay | Admitting: *Deleted

## 2021-11-27 ENCOUNTER — Encounter: Payer: Self-pay | Admitting: Radiation Oncology

## 2021-11-27 ENCOUNTER — Ambulatory Visit
Admission: RE | Admit: 2021-11-27 | Discharge: 2021-11-27 | Disposition: A | Discharge status: Home / Self Care | Payer: 59 | Source: Ambulatory Visit | Attending: Radiation Oncology | Admitting: Radiation Oncology

## 2021-11-27 ENCOUNTER — Ambulatory Visit: Payer: 59

## 2021-11-27 DIAGNOSIS — C50812 Malignant neoplasm of overlapping sites of left female breast: Secondary | ICD-10-CM | POA: Diagnosis not present

## 2021-11-27 DIAGNOSIS — C50911 Malignant neoplasm of unspecified site of right female breast: Secondary | ICD-10-CM | POA: Diagnosis not present

## 2021-11-27 DIAGNOSIS — C50912 Malignant neoplasm of unspecified site of left female breast: Secondary | ICD-10-CM | POA: Diagnosis not present

## 2021-11-27 DIAGNOSIS — Z171 Estrogen receptor negative status [ER-]: Secondary | ICD-10-CM | POA: Diagnosis not present

## 2021-11-27 DIAGNOSIS — C50412 Malignant neoplasm of upper-outer quadrant of left female breast: Secondary | ICD-10-CM | POA: Diagnosis not present

## 2021-11-28 ENCOUNTER — Encounter: Payer: Self-pay | Admitting: Oncology

## 2021-11-28 ENCOUNTER — Ambulatory Visit: Payer: 59

## 2021-11-28 ENCOUNTER — Encounter: Payer: Self-pay | Admitting: Hematology and Oncology

## 2021-11-28 NOTE — Progress Notes (Signed)
° °                                                                                                                                                         °  Patient Name: Leslie Wright MRN: [id number] DOB: 1966-08-06 Referring Physician: Rachell Cipro (Profile Not Attached) Date of Service: 11/27/2021 [facility] [place], [place]                                                        End Of Treatment Note  Diagnoses: C50.511-Malignant neoplasm of lower-outer quadrant of right female breast C50.812-Malignant neoplasm of overlapping sites of left female breast  Cancer Staging: Stage IA, cT1cN0-1M0 grade 2 ER positive invasive ductal carcinoma of the right breast with synchronous Stage IIIB, cT3N0M0 grade 3, anticipated to be functionally triple negative disease in the left breast  Intent: Curative  Radiation Treatment Dates: 10/05/2021 through 11/27/2021 Site Technique Total Dose (Gy) Dose per Fx (Gy) Completed Fx Beam Energies  Chest Wall, Left: CW_L 3D 50.4/50.4 1.8 28/28 10X  Chest Wall, Left: CW_L_SCLV 3D 50.4/50.4 1.8 28/28 6X, 10X  Chest Wall, Left: CW_L_Bst specialPort 10/10 2 5/5 9E   Narrative: The patient tolerated radiation therapy relatively well. She developed fatigue and anticipated skin changes and dry desquamation in the treatment field. No evidence of moist desquamation was noted at the conclusion of therapy.  Plan: The patient will receive a call in about one month from the radiation oncology department. She will continue follow up with Dr. Chryl Heck as well.   ________________________________________________    Carola Rhine, [facility]

## 2021-12-05 ENCOUNTER — Ambulatory Visit: Payer: 59 | Admitting: Physical Therapy

## 2021-12-05 NOTE — Progress Notes (Signed)
Patient Care Team: Fanny Bien, MD as PCP - General (Family Medicine) Herminio Commons, MD (Inactive) as PCP - Cardiology (Cardiology) Mauro Kaufmann, RN as Oncology Nurse Navigator Rockwell Germany, RN as Oncology Nurse Navigator Stark Klein, MD as Consulting Physician (General Surgery) Kyung Rudd, MD as Consulting Physician (Radiation Oncology)  DIAGNOSIS:    ICD-10-CM   1. Malignant neoplasm of overlapping sites of left breast in female, estrogen receptor negative ([place])  C50.812    Z17.1       SUMMARY OF ONCOLOGIC HISTORY: Oncology History  Malignant neoplasm of lower-outer quadrant of right breast of female, estrogen receptor positive ([place])  01/20/2021 Cancer Staging   Staging form: Breast, AJCC 8th Edition - Clinical stage from 01/20/2021: Stage IA (cT1c, cN0, cM0, G2, ER+, PR-, HER2-) - Signed by Chauncey Cruel, MD on 01/25/2021 Stage prefix: Initial diagnosis Histologic grading system: 3 grade system    01/23/2021 Initial Diagnosis   Malignant neoplasm of lower-outer quadrant of right breast of female, estrogen receptor positive ([place])   02/10/2021 - 06/17/2021 Chemotherapy   Patient is on Treatment Plan : BREAST Pembrolizumab + Carboplatin D1 + Paclitaxel D1,8,15 q21d X 4 cycles / Pembrolizumab + AC q21d x 4 cycles      Genetic Testing   Negative genetic testing. No pathogenic variants identified on the [facility] Multi-Cancer Panel+RNA. VUS in ATM called c.1132A>G identified. The report date is 02/13/2021.  The Multi-Cancer Panel + RNA offered by [facility] includes sequencing and/or deletion duplication testing of the following 84 genes: AIP, ALK, APC, ATM, AXIN2,BAP1,  BARD1, BLM, BMPR1A, BRCA1, BRCA2, BRIP1, CASR, CDC73, CDH1, CDK4, CDKN1B, CDKN1C, CDKN2A (p14ARF), CDKN2A (p16INK4a), CEBPA, CHEK2, CTNNA1, DICER1, DIS3L2, EGFR (c.2369C>T, p.Thr790Met variant only), EPCAM (Deletion/duplication testing only), FH, FLCN, GATA2, GPC3, GREM1 (Promoter region  deletion/duplication testing only), HOXB13 (c.251G>A, p.Gly84Glu), HRAS, KIT, MAX, MEN1, MET, MITF (c.952G>A, p.Glu318Lys variant only), MLH1, MSH2, MSH3, MSH6, MUTYH, NBN, NF1, NF2, NTHL1, PALB2, PDGFRA, PHOX2B, PMS2, POLD1, POLE, POT1, PRKAR1A, PTCH1, PTEN, RAD50, RAD51C, RAD51D, RB1, RECQL4, RET, RUNX1, SDHAF2, SDHA (sequence changes only), SDHB, SDHC, SDHD, SMAD4, SMARCA4, SMARCB1, SMARCE1, STK11, SUFU, TERC, TERT, TMEM127, TP53, TSC1, TSC2, VHL, WRN and WT1.   Malignant neoplasm of overlapping sites of left breast in female, estrogen receptor negative ([place])  01/20/2021 Cancer Staging   Staging form: Breast, AJCC 8th Edition - Clinical stage from 01/20/2021: Stage IIIB (cT3, cN0, cM0, G3, ER-, PR-, HER2-) - Signed by Chauncey Cruel, MD on 01/25/2021 Histologic grading system: 3 grade system    01/25/2021 Initial Diagnosis   Malignant neoplasm of overlapping sites of left breast in female, estrogen receptor negative ([place])   02/10/2021 - 06/17/2021 Chemotherapy   Patient is on Treatment Plan : BREAST Pembrolizumab + Carboplatin D1 + Paclitaxel D1,8,15 q21d X 4 cycles / Pembrolizumab + AC q21d x 4 cycles      Genetic Testing   Negative genetic testing. No pathogenic variants identified on the [employer] Multi-Cancer Panel+RNA. VUS in ATM called c.1132A>G identified. The report date is 02/13/2021.  The Multi-Cancer Panel + RNA offered by [facility] includes sequencing and/or deletion duplication testing of the following 84 genes: AIP, ALK, APC, ATM, AXIN2,BAP1,  BARD1, BLM, BMPR1A, BRCA1, BRCA2, BRIP1, CASR, CDC73, CDH1, CDK4, CDKN1B, CDKN1C, CDKN2A (p14ARF), CDKN2A (p16INK4a), CEBPA, CHEK2, CTNNA1, DICER1, DIS3L2, EGFR (c.2369C>T, p.Thr790Met variant only), EPCAM (Deletion/duplication testing only), FH, FLCN, GATA2, GPC3, GREM1 (Promoter region deletion/duplication testing only), HOXB13 (c.251G>A, p.Gly84Glu), HRAS, KIT, MAX, MEN1, MET, MITF (c.952G>A, p.Glu318Lys variant only), MLH1,  MSH2, MSH3, MSH6,  MUTYH, NBN, NF1, NF2, NTHL1, PALB2, PDGFRA, PHOX2B, PMS2, POLD1, POLE, POT1, PRKAR1A, PTCH1, PTEN, RAD50, RAD51C, RAD51D, RB1, RECQL4, RET, RUNX1, SDHAF2, SDHA (sequence changes only), SDHB, SDHC, SDHD, SMAD4, SMARCA4, SMARCB1, SMARCE1, STK11, SUFU, TERC, TERT, TMEM127, TP53, TSC1, TSC2, VHL, WRN and WT1.   07/28/2021 -  Chemotherapy   Patient is on Treatment Plan : HEAD/NECK Pembrolizumab Q21D       CHIEF COMPLIANT: Follow-up of bilateral breast cancers  INTERVAL HISTORY: ED Leslie Wright is a 56 y.o. with above-mentioned history of bilateral breast cancers having undergone bilateral mastectomies. She presents to the clinic today for follow-up.   ALLERGIES:  has No Known Allergies.  MEDICATIONS:  Current Outpatient Medications  Medication Sig Dispense Refill   acetaminophen (TYLENOL) 500 MG tablet Take 1,000 mg by mouth every 6 (six) hours as needed for moderate pain, mild pain or headache.     aspirin EC 81 MG tablet Take 81 mg by mouth daily.     betamethasone valerate ointment (VALISONE) 0.1 % Apply 1 application topically 2 (two) times daily. 30 g 0   folic acid (FOLVITE) 1 MG tablet Take 1 mg by mouth daily.     ketoconazole (NIZORAL) 2 % cream Apply 1 application topically daily. 15 g 0   losartan (COZAAR) 100 MG tablet Take 1 tablet by mouth daily 90 tablet 3   omeprazole (PRILOSEC) 40 MG capsule Take 1 capsule (40 mg total) by mouth at bedtime. 60 capsule 0   No current facility-administered medications for this visit.    PHYSICAL EXAMINATION: ECOG PERFORMANCE STATUS: 1 - Symptomatic but completely ambulatory  Vitals:   12/06/21 1051  BP: (!) 173/94  Pulse: 71  Resp: 18  Temp: (!) 97.5 F (36.4 C)  SpO2: 98%   Filed Weights   12/06/21 1051  Weight: 253 lb 1.6 oz (114.8 kg)    BREAST: No palpable masses or nodules in either right or left breasts. No palpable axillary supraclavicular or infraclavicular adenopathy no breast tenderness or nipple discharge. (exam  performed in the presence of a chaperone)  LABORATORY DATA:  I have reviewed the data as listed CMP Latest Ref Rng & Units 10/09/2021 09/07/2021 08/18/2021  Glucose 70 - 99 mg/dL 124(H) 116(H) 120(H)  BUN 6 - 20 mg/dL 11 12 13   Creatinine 0.44 - 1.00 mg/dL 1.24(H) 1.14(H) 1.18(H)  Sodium 135 - 145 mmol/L 140 144 142  Potassium 3.5 - 5.1 mmol/L 3.9 3.1(L) 3.3(L)  Chloride 98 - 111 mmol/L 105 106 107  CO2 22 - 32 mmol/L 22 24 23   Calcium 8.9 - 10.3 mg/dL 9.2 9.0 9.4  Total Protein 6.5 - 8.1 g/dL - 6.7 7.0  Total Bilirubin 0.3 - 1.2 mg/dL - 0.3 <0.2(L)  Alkaline Phos 38 - 126 U/L - 76 69  AST 15 - 41 U/L - 11(L) 16  ALT 0 - 44 U/L - 15 12    Lab Results  Component Value Date   WBC 6.9 12/06/2021   HGB 11.4 (L) 12/06/2021   HCT 35.1 (L) 12/06/2021   MCV 88.6 12/06/2021   PLT 170 12/06/2021   NEUTROABS 5.0 12/06/2021    ASSESSMENT & PLAN:   08/08/2021: Bilateral mastectomies she Malignant neoplasm of overlapping sites of left breast in female, estrogen receptor negative ([place]) right breast, a clinical T2 N0-1, stage IIA-IIB invasive ductal carcinoma, grade 2, estrogen receptor positive, progesterone receptor and HER2 negative, with an MIB-1 of 5%  left breast, a clinical T3 N0, stage IIIB  invasive ductal carcinoma, grade 3, functionally triple negative, with an MIB-1 of 25% (HER2 results are pending).  neoadjuvant chemotherapy started 02/09/2021 consisting of pembrolizumab/ carboplatin/ paclitaxel stopped after 3 cycles due to peripheral neuropathy followed by pembrolizumab/ doxorubicin/ cyclophosphamide x4 started 04/13/2021, completed on 06/15/2021  Adjuvant radiation completed 11/27/2021 Treatment plan:  Adjuvant capecitabine 1000 mg p.o. twice daily 14 days on 7 days off for 6 months CREATE-X study enrolled 910 patient's with triple negative breast cancer and residual disease after neo-adjuvant therapy. 455 patients assigned to 1250 mg/m times daily 2 weeks on 1 week off  up to 8  cycles, at 5 years PFS 74.1% with Xeloda compared to 67.7% in control arm, 30% reduction in risk, overall survival 89.2% versus 83.9%, hand-foot syndrome was seen in 72.3% with Xeloda grade 3 in 10.9%  2. antiestrogens for the right-sided breast cancer: Will start after Xeloda is complete  Return to clinic in 3 weeks to see Jenny Reichmann our pharmacy provider.  After that I will see her monthly.  No orders of the defined types were placed in this encounter.  The patient has a good understanding of the overall plan. she agrees with it. she will call with any problems that may develop before the next visit here.  Total time spent: 20 mins including face to face time and time spent for planning, charting and coordination of care  Rulon Eisenmenger, MD, MPH 12/06/2021  I, Thana Ates, am acting as scribe for Dr. Nicholas Lose.  I have reviewed the above documentation for accuracy and completeness, and I agree with the above.

## 2021-12-06 ENCOUNTER — Inpatient Hospital Stay: Payer: 59 | Attending: Oncology

## 2021-12-06 ENCOUNTER — Telehealth: Payer: Self-pay

## 2021-12-06 ENCOUNTER — Other Ambulatory Visit: Payer: Self-pay

## 2021-12-06 ENCOUNTER — Other Ambulatory Visit (HOSPITAL_COMMUNITY): Payer: Self-pay

## 2021-12-06 ENCOUNTER — Inpatient Hospital Stay: Payer: 59 | Admitting: Hematology and Oncology

## 2021-12-06 VITALS — BP 173/94 | HR 71 | Temp 97.5°F | Resp 18 | Ht 67.0 in | Wt 253.1 lb

## 2021-12-06 DIAGNOSIS — Z171 Estrogen receptor negative status [ER-]: Secondary | ICD-10-CM | POA: Insufficient documentation

## 2021-12-06 DIAGNOSIS — M539 Dorsopathy, unspecified: Secondary | ICD-10-CM

## 2021-12-06 DIAGNOSIS — Z79899 Other long term (current) drug therapy: Secondary | ICD-10-CM | POA: Diagnosis not present

## 2021-12-06 DIAGNOSIS — C50511 Malignant neoplasm of lower-outer quadrant of right female breast: Secondary | ICD-10-CM | POA: Diagnosis not present

## 2021-12-06 DIAGNOSIS — Z9013 Acquired absence of bilateral breasts and nipples: Secondary | ICD-10-CM | POA: Insufficient documentation

## 2021-12-06 DIAGNOSIS — C50812 Malignant neoplasm of overlapping sites of left female breast: Secondary | ICD-10-CM

## 2021-12-06 DIAGNOSIS — Z923 Personal history of irradiation: Secondary | ICD-10-CM | POA: Diagnosis not present

## 2021-12-06 DIAGNOSIS — Z170 Estrogen receptor positive status [ER+]: Secondary | ICD-10-CM | POA: Insufficient documentation

## 2021-12-06 DIAGNOSIS — K760 Fatty (change of) liver, not elsewhere classified: Secondary | ICD-10-CM

## 2021-12-06 DIAGNOSIS — L739 Follicular disorder, unspecified: Secondary | ICD-10-CM

## 2021-12-06 LAB — CBC WITH DIFFERENTIAL/PLATELET
Abs Immature Granulocytes: 0.05 10*3/uL (ref 0.00–0.07)
Basophils Absolute: 0 10*3/uL (ref 0.0–0.1)
Basophils Relative: 0 %
Eosinophils Absolute: 0.5 10*3/uL (ref 0.0–0.5)
Eosinophils Relative: 7 %
HCT: 35.1 % — ABNORMAL LOW (ref 36.0–46.0)
Hemoglobin: 11.4 g/dL — ABNORMAL LOW (ref 12.0–15.0)
Immature Granulocytes: 1 %
Lymphocytes Relative: 10 %
Lymphs Abs: 0.7 10*3/uL (ref 0.7–4.0)
MCH: 28.8 pg (ref 26.0–34.0)
MCHC: 32.5 g/dL (ref 30.0–36.0)
MCV: 88.6 fL (ref 80.0–100.0)
Monocytes Absolute: 0.7 10*3/uL (ref 0.1–1.0)
Monocytes Relative: 10 %
Neutro Abs: 5 10*3/uL (ref 1.7–7.7)
Neutrophils Relative %: 72 %
Platelets: 170 10*3/uL (ref 150–400)
RBC: 3.96 MIL/uL (ref 3.87–5.11)
RDW: 16 % — ABNORMAL HIGH (ref 11.5–15.5)
WBC: 6.9 10*3/uL (ref 4.0–10.5)
nRBC: 0 % (ref 0.0–0.2)

## 2021-12-06 LAB — COMPREHENSIVE METABOLIC PANEL
ALT: 8 U/L (ref 0–44)
AST: 10 U/L — ABNORMAL LOW (ref 15–41)
Albumin: 4 g/dL (ref 3.5–5.0)
Alkaline Phosphatase: 64 U/L (ref 38–126)
Anion gap: 9 (ref 5–15)
BUN: 14 mg/dL (ref 6–20)
CO2: 27 mmol/L (ref 22–32)
Calcium: 9.8 mg/dL (ref 8.9–10.3)
Chloride: 106 mmol/L (ref 98–111)
Creatinine, Ser: 1.09 mg/dL — ABNORMAL HIGH (ref 0.44–1.00)
GFR, Estimated: 60 mL/min — ABNORMAL LOW (ref >60)
Glucose, Bld: 93 mg/dL (ref 70–99)
Potassium: 3.7 mmol/L (ref 3.5–5.1)
Sodium: 142 mmol/L (ref 135–145)
Total Bilirubin: 0.3 mg/dL (ref 0.3–1.2)
Total Protein: 7 g/dL (ref 6.5–8.1)

## 2021-12-06 LAB — TSH: TSH: 2.036 u[IU]/mL (ref 0.308–3.960)

## 2021-12-06 LAB — T4, FREE: Free T4: 0.67 ng/dL (ref 0.61–1.12)

## 2021-12-06 MED ORDER — CAPECITABINE 500 MG PO TABS
1000.0000 mg | ORAL_TABLET | Freq: Two times a day (BID) | ORAL | 7 refills | Status: DC
  Filled 2021-12-06: qty 56, 14d supply, fill #0

## 2021-12-06 NOTE — Telephone Encounter (Signed)
Oral Oncology Pharmacist Encounter ? ?Received new prescription for capecitabine (Xeloda) for the treatment of triple negative breast cancer, planned duration 6 months of therapy. Prescription dose and frequency assessed.  ? ?Labs from 12/06/21 assessed, no interventions needed. ? ?Current medication list in Epic reviewed, DDIs with xeloda identified:  ?- folic acid: can enhance toxic effects of xeloda ?- omeprazole: PPIs can decrease effect of xeloda ? ?Evaluated chart and no patient barriers to medication adherence noted.  ?  ?Patient agreement for treatment documented in MD note on 12/06/21. ? ?Prescription has been e-scribed to the [facility] for benefits analysis and approval. ? ?Oral Oncology Clinic will continue to follow for insurance authorization, copayment issues, initial counseling and start date. ? ?Drema Halon, PharmD ?Hematology/Oncology Clinical Pharmacist ?[facility] ?[phone] ?12/06/2021 11:25 AM ? ?

## 2021-12-06 NOTE — Assessment & Plan Note (Signed)
right breast, a clinical T2 N0-1, stage IIA-IIB invasive ductal carcinoma, grade 2, estrogen receptor positive, progesterone receptor and HER2 negative, with an MIB-1 of 5% ? ?left breast, a clinical T3 N0, stage IIIB invasive ductal carcinoma, grade 3, functionally triple negative, with an MIB-1 of 25% (HER2 results are pending). ? ?neoadjuvant chemotherapy started 02/09/2021 consisting of pembrolizumab/ carboplatin/ paclitaxel stopped after 3 cycles due to peripheral neuropathy followed by pembrolizumab/ doxorubicin/ cyclophosphamide x4 started 04/13/2021, completed on 06/15/2021 ? ?Adjuvant radiation completed 11/27/2021 ?Treatment plan: Discussed the pros and cons of capecitabine for adjuvant therapy on the left side ?2. antiestrogens for the right-sided breast cancer ?

## 2021-12-06 NOTE — Telephone Encounter (Signed)
Oral Oncology Patient Advocate Encounter ?  ?Received notification from [facility] that prior authorization for Xeloda is required. ?  ?PA submitted on CoverMyMeds ?Key [id number] ?Status is pending ?  ?Oral Oncology Clinic will continue to follow. ? ?Wynn Maudlin CPHT ?Specialty Pharmacy Patient Advocate ?[facility] ?Phone [phone] ?Fax [phone] ?12/06/2021 11:29 AM ? ? ?

## 2021-12-07 ENCOUNTER — Other Ambulatory Visit (HOSPITAL_COMMUNITY): Payer: Self-pay

## 2021-12-07 ENCOUNTER — Encounter: Payer: Self-pay | Admitting: *Deleted

## 2021-12-07 NOTE — Telephone Encounter (Signed)
Oral Oncology Patient Advocate Encounter ? ?Prior Authorization for Xeloda has been approved.   ? ?PA# [id number] ?Effective dates: 12/06/21 through 12/06/22 ? ?Patients co-pay is $15 ? ?Oral Oncology Clinic will continue to follow.  ? ?Wynn Maudlin CPHT ?Specialty Pharmacy Patient Advocate ?[place] ?Phone [phone] ?Fax [phone] ?12/07/2021 9:23 AM ? ?

## 2021-12-08 ENCOUNTER — Other Ambulatory Visit (HOSPITAL_COMMUNITY): Payer: Self-pay

## 2021-12-08 MED ORDER — CAPECITABINE 500 MG PO TABS
1000.0000 mg | ORAL_TABLET | Freq: Two times a day (BID) | ORAL | 7 refills | Status: DC
  Filled 2021-12-08: qty 56, 14d supply, fill #0
  Filled 2021-12-08: qty 56, 21d supply, fill #0
  Filled 2021-12-21: qty 56, 21d supply, fill #1
  Filled 2022-01-12: qty 56, 21d supply, fill #2
  Filled 2022-01-30: qty 56, 21d supply, fill #3
  Filled 2022-02-19: qty 56, 21d supply, fill #4
  Filled 2022-03-13: qty 56, 21d supply, fill #5
  Filled 2022-04-05: qty 56, 21d supply, fill #6
  Filled 2022-05-10: qty 56, 21d supply, fill #7

## 2021-12-08 NOTE — Telephone Encounter (Addendum)
Oral Chemotherapy Pharmacist Encounter ? 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 ? ?Counseled patient on administration, dosing, side effects, monitoring, drug-food interactions, safe handling, storage, and disposal. ? ?Patient will take Xeloda '500mg'$  tablets, 2 tablets ('1000mg'$ ) by mouth in AM and 2 tabs ('1000mg'$ ) by mouth in PM, within 30 minutes of finishing meals, for 14 days on, 7 days off, repeated every 21 days. ? ?Xeloda start date: 12/12/2021 ? ?Adverse effects include but are not limited to: fatigue, decreased blood counts, GI upset, diarrhea, mouth sores, and hand-foot syndrome. ? ?Patient has anti-emetic on hand and knows to take it if nausea develops.   ?Patient will obtain anti diarrheal and alert the office of 4 or more loose stools above baseline. ? ?Patient will switch from omeprazole to famotidine for acid reflux. ? ?Reviewed with patient importance of keeping a medication schedule and plan for any missed doses. No barriers to medication adherence identified. ? ?Medication reconciliation performed and medication/allergy list updated. ? ?This will ship from the [facility] on 12/08/2021 to deliver to patient's home on 12/11/2021. ? ?Patient informed the pharmacy will reach out 5-7 days prior to needing next fill of Xeloda to coordinate continued medication acquisition to prevent break in therapy. ? ?All questions answered. ? ?Leslie Wright voiced understanding and appreciation.  ? ?Medication education handout placed in mail for patient. Patient knows to call the office with questions or concerns. Oral Chemotherapy Clinic phone number provided to patient.  ? ?Drema Halon, PharmD ?Hematology/Oncology Clinical Pharmacist ?[facility] ?[phone] ? ?

## 2021-12-18 ENCOUNTER — Other Ambulatory Visit (HOSPITAL_COMMUNITY): Payer: Self-pay

## 2021-12-19 ENCOUNTER — Ambulatory Visit: Payer: 59 | Admitting: Hematology and Oncology

## 2021-12-19 ENCOUNTER — Other Ambulatory Visit: Payer: 59

## 2021-12-21 ENCOUNTER — Other Ambulatory Visit (HOSPITAL_COMMUNITY): Payer: Self-pay

## 2021-12-24 DIAGNOSIS — J9601 Acute respiratory failure with hypoxia: Secondary | ICD-10-CM | POA: Diagnosis not present

## 2021-12-25 ENCOUNTER — Ambulatory Visit
Admission: RE | Admit: 2021-12-25 | Discharge: 2021-12-25 | Disposition: A | Discharge status: Home / Self Care | Payer: 59 | Source: Ambulatory Visit | Attending: Radiation Oncology | Admitting: Radiation Oncology

## 2021-12-25 ENCOUNTER — Other Ambulatory Visit: Payer: Self-pay

## 2021-12-25 ENCOUNTER — Ambulatory Visit: Payer: 59 | Attending: General Surgery | Admitting: Physical Therapy

## 2021-12-25 ENCOUNTER — Encounter: Payer: Self-pay | Admitting: Physical Therapy

## 2021-12-25 DIAGNOSIS — C50512 Malignant neoplasm of lower-outer quadrant of left female breast: Secondary | ICD-10-CM | POA: Insufficient documentation

## 2021-12-25 DIAGNOSIS — M25611 Stiffness of right shoulder, not elsewhere classified: Secondary | ICD-10-CM | POA: Insufficient documentation

## 2021-12-25 DIAGNOSIS — M6281 Muscle weakness (generalized): Secondary | ICD-10-CM | POA: Diagnosis not present

## 2021-12-25 DIAGNOSIS — R293 Abnormal posture: Secondary | ICD-10-CM | POA: Insufficient documentation

## 2021-12-25 DIAGNOSIS — C50511 Malignant neoplasm of lower-outer quadrant of right female breast: Secondary | ICD-10-CM | POA: Diagnosis not present

## 2021-12-25 DIAGNOSIS — Z170 Estrogen receptor positive status [ER+]: Secondary | ICD-10-CM

## 2021-12-25 DIAGNOSIS — Z171 Estrogen receptor negative status [ER-]: Secondary | ICD-10-CM | POA: Insufficient documentation

## 2021-12-25 DIAGNOSIS — Z483 Aftercare following surgery for neoplasm: Secondary | ICD-10-CM | POA: Diagnosis not present

## 2021-12-25 DIAGNOSIS — M25612 Stiffness of left shoulder, not elsewhere classified: Secondary | ICD-10-CM | POA: Diagnosis not present

## 2021-12-25 NOTE — Patient Instructions (Signed)
Over Head Pull: Narrow and Wide Grip   Cancer Rehab [phone] ? ? ?On back, knees bent, feet flat, band across thighs, elbows straight but relaxed. Pull hands apart (start). Keeping elbows straight, bring arms up and over head, hands toward floor. Keep pull steady on band. Hold momentarily. Return slowly, keeping pull steady, back to start. Then do same with a wider grip on the band (past shoulder width) ?Repeat _10__ times. Band color __red____  ? ?Side Pull: Double Arm ? ? ?On back, knees bent, feet flat. Arms perpendicular to body, shoulder level, elbows straight but relaxed. Pull arms out to sides, elbows straight. Resistance band comes across collarbones, hands toward floor. Hold momentarily. Slowly return to starting position. Repeat _10__ times. Band color _red____  ? ?Sword ? ? ?On back, knees bent, feet flat, left hand on left hip, right hand above left. Pull right arm DIAGONALLY (hip to shoulder) across chest. Bring right arm along head toward floor. Hold momentarily. Thumb is pointed down when by hip and rotates backwards when by head. Slowly return to starting position. ?Repeat _10__ times. Do with left arm. Band color _red_____  ? ?Shoulder Rotation: Double Arm ? ? ?On back, knees bent, feet flat, elbows tucked at sides, bent 90?, hands palms up. Pull hands apart and down toward floor, keeping elbows near sides. Hold momentarily. Slowly return to starting position. ?Repeat _10__ times. Band color __red____  ? ? ?

## 2021-12-25 NOTE — Progress Notes (Signed)
?  Radiation Oncology         [phone] ?________________________________ ? ?Name: Leslie Wright MRN: [id number]  ?Date of Service: 12/25/2021  DOB: Dec 17, 1965 ? ?Post Treatment Telephone Note ? 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 ?Intent: Curative ? ?Radiation Treatment Dates: 10/05/2021 through 11/27/2021 ?Site Technique Total Dose (Gy) Dose per Fx (Gy) Completed Fx Beam Energies  ?Chest Wall, Left: CW_L 3D 50.4/50.4 1.8 28/28 10X  ?Chest Wall, Left: CW_L_SCLV 3D 50.4/50.4 1.8 28/28 6X, 10X  ?Chest Wall, Left: CW_L_Bst specialPort 10/10 2 5/5 9E  ? ?Narrative: The patient tolerated radiation therapy relatively well. She developed fatigue and anticipated skin changes and dry desquamation in the treatment field. No evidence of moist desquamation was noted at the conclusion of therapy. Her skin has stayed intact and she reports it is a bit tight but no other complaints are noted from radiation. She will meet with PT and follow up with Dr. Lindi Adie. ? ? ?Impression/Plan: ?1. 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 The patient has been doing well since completion of radiotherapy. We discussed that we would be happy to continue to follow her as needed, but she will also continue to follow up with Dr. Lindi Adie in medical oncology. She was counseled on skin care as well as measures to avoid sun exposure to this area.   ? ? ? ? ? ?Carola Rhine, PAC  ? ? ? ? ?

## 2021-12-25 NOTE — Therapy (Signed)
[facility] ?[place], [place], [address] ?Phone: [phone]   Fax:  [phone] ? ?Physical Therapy Treatment ? ?Patient Details  ?Name: Leslie Wright ?MRN: [id number] ?Date of Birth: October 12, 1965 ?Referring Provider (PT): Magrinat ? ? ?Encounter Date: 12/25/2021 ? ? PT End of Session - 12/25/21 1404   ? ? Visit Number 6   ? Number of Visits 10   ? Date for PT Re-Evaluation 12/20/21   ? PT Start Time 9450   ? PT Stop Time 1424   ? PT Time Calculation (min) 21 min   ? Activity Tolerance Patient tolerated treatment well   ? Behavior During Therapy [facility] for tasks assessed/performed   ? ?  ?  ? ?  ? ? ?Past Medical History:  ?Diagnosis Date  ? Acid reflux   ? Anemia   ? Arthritis   ? "maybe in my fingers" (03/26/2018)  ? Breast cancer ([facility]) 12/2020  ? both sides  ? Concussion 2001  ? no deficit had tia right after no problems since  ? Family history of adverse reaction to anesthesia   ? "brother, Timmy, and my mom always get sick" (03/26/2018)  ? Family history of brain cancer   ? Family history of breast cancer   ? Family history of colon cancer   ? Family history of lung cancer   ? Family history of pancreatic cancer   ? Family history of prostate cancer   ? Headache   ? High blood pressure   ? High cholesterol   ? Hip pain   ? "left; before OR" (03/26/2018)  ? History of chemotherapy last done 02-17-2021  ? History of hiatal hernia   ? History of kidney stones   ? OSA (obstructive sleep apnea)   ? "can't tolerate the mask" (03/26/2018)   ? PONV (postoperative nausea and vomiting)   ? does not need much anesthesia, slow to awlaekn in past  ? Pre-diabetes   ? Shortness of breath   ? with exertion  ? TIA (transient ischemic attack)   ? 15 years ago  ? Wears partial dentures upper  ? ? ?Past Surgical History:  ?Procedure Laterality Date  ? CARPAL TUNNEL RELEASE Right ~ 2006  ? JOINT REPLACEMENT Left   ? total left hip  ? LAPAROSCOPIC CHOLECYSTECTOMY  12/2010   ? LASIK Bilateral 2000  ? PORT A CATH REVISION Left 02/21/2021  ? Procedure: PORT A CATH REVISION;  Surgeon: Stark Klein, MD;  Location: [facility];  Service: General;  Laterality: Left;  60 MINUTES ROOM 5  ? PORT-A-CATH REMOVAL Left 10/11/2021  ? Procedure: PORT REMOVAL;  Surgeon: Stark Klein, MD;  Location: [facility];  Service: General;  Laterality: Left;  ? PORTACATH PLACEMENT N/A 02/08/2021  ? Procedure: INSERTION PORT-A-CATH;  Surgeon: Stark Klein, MD;  Location: [facility];  Service: General;  Laterality: N/A;  ? RADIOACTIVE SEED GUIDED AXILLARY SENTINEL LYMPH NODE Right 08/08/2021  ? Procedure: RADIOACTIVE SEED GUIDED RIGHT AXILLARY SENTINEL LYMPH NODE BIOPSY;  Surgeon: Stark Klein, MD;  Location: [facility];  Service: General;  Laterality: Right;  ? SENTINEL NODE BIOPSY Bilateral 08/08/2021  ? Procedure: BILATERAL SENTINEL NODE BIOPSY;  Surgeon: Stark Klein, MD;  Location: [facility];  Service: General;  Laterality: Bilateral;  ? TOTAL HIP ARTHROPLASTY Left 03/25/2018  ? Procedure: LEFT TOTAL HIP ARTHROPLASTY ANTERIOR APPROACH;  Surgeon: Mcarthur Rossetti, MD;  Location: [facility];  Service: Orthopedics;  Laterality:  Left;  ? TOTAL MASTECTOMY Bilateral 08/08/2021  ? Procedure: BILATERAL TOTAL MASTECTOMY;  Surgeon: Byerly, Faera, MD;  Location[facility];  Service: General;  Laterality: Bilateral;  ? ? ?There were no vitals filed for this visit. ? ? Subjective Assessment - 12/25/21 1405   ? ? Subjective I have just started feeling tight across my chest. I feel like my ROM is fine. I have been doing what I want to do. I have been working in the yard.   ? Pertinent History Bilateral breast cancer - R breast IDC grade 2 ER+ Her2-; L breast IDC grade 3 DCIS triple negative, completed neoadjuvant chemo, ; bilateral mastectomy 08/08/21 R SLNB (0/2), L SLNB (0/4), completed radiation   ? Patient Stated Goals to gain info from provider   ? Currently in Pain? No/denies   ? Pain Score 0-No pain    ? ?  ?  ? ?  ? ? ? ? ? [facility] PT Assessment - 12/25/21 0001   ? ?  ? Balance Screen  ? Has the patient fallen in the past 6 months Yes   ? How many times? 1   did not pick foot up high enough  ? Has the patient had a decrease in activity level because of a fear of falling?  No   ? Is the patient reluctant to leave their home because of a fear of falling?  No   ?  ? AROM  ? Right Shoulder Flexion 168 Degrees   ? Right Shoulder ABduction 170 Degrees   ? Left Shoulder Flexion 163 Degrees   ? Left Shoulder ABduction 170 Degrees   ? ?  ?  ? ?  ? ? ? ? ? ? ? ? ? ? ? ? ? ? ? ? ? ? ? ? ? ? ? ? ? PT Education - 12/25/21 1429   ? ? Education Details supine over towel roll for pec stretch, supine scap series   ? Person(s) Educated Patient   ? Methods Explanation;Demonstration;Handout   ? Comprehension Verbalized understanding;Returned demonstration   ? ?  ?  ? ?  ? ? ? ? ? ? PT Long Term Goals - 12/25/21 1424   ? ?  ? PT LONG TERM GOAL #1  ? Title Pt will return to baseline shoulder ROM measurements and not demonstrate any signs or symptoms of lymphedema   ? Time 6   ? Period Months   ? Status Achieved   ?  ? PT LONG TERM GOAL #2  ? Title Pt will demonstrate 153 degrees of bilateral shoulder flexion to allow her to reach overhead   ? Baseline R 135 L 143   ? Time 4   ? Period Weeks   ? Status Achieved   ? Target Date 09/26/21   ?  ? PT LONG TERM GOAL #3  ? Title Pt will demonstrate 170 degrees of bilateral shoulder abduction to allow pt to reach out to the side   ? Baseline R 138 L 145   ? Time 4   ? Period Weeks   ? Status Achieved   ? Target Date 09/26/21   ?  ? PT LONG TERM GOAL #4  ? Title Pt will be independent in a home exercise program for long term stretching and strengthening   ? Time 4   ? Period Weeks   ? Status Achieved   ? Target Date 09/26/21   ?  ? PT LONG TERM GOAL #5  ?   Title Pt will attend ABC class for instruction on lymphedema risk reduction practices   ? Baseline 12/25/21- pt is signed up for 4/16 class   ?  Time 4   ? Period Weeks   ? Status Achieved   ? Target Date 09/26/21   ? ?  ?  ? ?  ? ? ? ? ? ? ? ? Plan - 12/25/21 1425   ? ? Clinical Impression Statement Assessed pt's progress towards all goals in therapy and she has met all goals for therapy. She has returned to baseline shoulder ROM after completing radiation. She reports she has returned to her normal activities and the only limitiation she feels is occasional tightness across her chest. Instructed pt in supine lying over a towel roll with arms outstretched to stretch bilateral pecs. Also issued supine scapular series on handout for pt to complete at home. She will be discharged at this time from skilled PT services. She will continue with Ldex screening every 3 months for the first 2 yrs post surgery.   ? PT Frequency 2x / week   ? PT Duration 12 weeks   ? PT Treatment/Interventions ADLs/Self Care Home Management;Patient/family education;Therapeutic exercise;Scar mobilization;Passive range of motion;Manual techniques;Manual lymph drainage;Taping;Vasopneumatic Device   ? PT Next Visit Plan Cont every 3 month L-Dex screens for up to 2 years from her bil SLNBs (~08/29/2023)   ? PT Home Exercise Plan supine scap, supine over towel roll   ? Consulted and Agree with Plan of Care Patient   ? ?  ?  ? ?  ? ? ?Patient will benefit from skilled therapeutic intervention in order to improve the following deficits and impairments:  Postural dysfunction, Decreased knowledge of precautions, Pain, Impaired UE functional use, Increased fascial restricitons, Decreased strength, Decreased range of motion, Decreased scar mobility ? ?Visit Diagnosis: ?Aftercare following surgery for neoplasm ? ?Stiffness of left shoulder, not elsewhere classified ? ?Stiffness of right shoulder, not elsewhere classified ? ?Muscle weakness (generalized) ? ?Abnormal posture ? ?Malignant neoplasm of lower-outer quadrant of right breast of female, estrogen receptor positive (HCC) ? ?Malignant neoplasm  of lower-outer quadrant of left breast of female, estrogen receptor negative (HCC) ? ? ? ? ?Problem List ?Patient Active Problem List  ? Diagnosis Date Noted  ? Bilateral breast cancer (HCC) 08/08/2021

## 2021-12-28 ENCOUNTER — Other Ambulatory Visit (HOSPITAL_COMMUNITY): Payer: Self-pay

## 2022-01-01 ENCOUNTER — Inpatient Hospital Stay: Payer: 59 | Attending: Oncology

## 2022-01-01 ENCOUNTER — Inpatient Hospital Stay: Payer: 59 | Admitting: Pharmacist

## 2022-01-01 ENCOUNTER — Other Ambulatory Visit: Payer: Self-pay

## 2022-01-01 DIAGNOSIS — C50812 Malignant neoplasm of overlapping sites of left female breast: Secondary | ICD-10-CM

## 2022-01-01 DIAGNOSIS — C50811 Malignant neoplasm of overlapping sites of right female breast: Secondary | ICD-10-CM | POA: Insufficient documentation

## 2022-01-01 DIAGNOSIS — R5383 Other fatigue: Secondary | ICD-10-CM | POA: Insufficient documentation

## 2022-01-01 DIAGNOSIS — R197 Diarrhea, unspecified: Secondary | ICD-10-CM | POA: Diagnosis not present

## 2022-01-01 DIAGNOSIS — Z171 Estrogen receptor negative status [ER-]: Secondary | ICD-10-CM | POA: Diagnosis not present

## 2022-01-01 DIAGNOSIS — Z79899 Other long term (current) drug therapy: Secondary | ICD-10-CM | POA: Insufficient documentation

## 2022-01-01 DIAGNOSIS — C50512 Malignant neoplasm of lower-outer quadrant of left female breast: Secondary | ICD-10-CM | POA: Diagnosis not present

## 2022-01-01 DIAGNOSIS — Z170 Estrogen receptor positive status [ER+]: Secondary | ICD-10-CM

## 2022-01-01 LAB — CMP (CANCER CENTER ONLY)
ALT: 11 U/L (ref 0–44)
AST: 12 U/L — ABNORMAL LOW (ref 15–41)
Albumin: 3.8 g/dL (ref 3.5–5.0)
Alkaline Phosphatase: 69 U/L (ref 38–126)
Anion gap: 10 (ref 5–15)
BUN: 13 mg/dL (ref 6–20)
CO2: 26 mmol/L (ref 22–32)
Calcium: 9 mg/dL (ref 8.9–10.3)
Chloride: 107 mmol/L (ref 98–111)
Creatinine: 1.07 mg/dL — ABNORMAL HIGH (ref 0.44–1.00)
GFR, Estimated: >60 mL/min (ref >60)
Glucose, Bld: 122 mg/dL — ABNORMAL HIGH (ref 70–99)
Potassium: 3.6 mmol/L (ref 3.5–5.1)
Sodium: 143 mmol/L (ref 135–145)
Total Bilirubin: 0.3 mg/dL (ref 0.3–1.2)
Total Protein: 7.1 g/dL (ref 6.5–8.1)

## 2022-01-01 LAB — CBC WITH DIFFERENTIAL (CANCER CENTER ONLY)
Abs Immature Granulocytes: 0.05 10*3/uL (ref 0.00–0.07)
Basophils Absolute: 0 10*3/uL (ref 0.0–0.1)
Basophils Relative: 0 %
Eosinophils Absolute: 0.9 10*3/uL — ABNORMAL HIGH (ref 0.0–0.5)
Eosinophils Relative: 13 %
HCT: 35.1 % — ABNORMAL LOW (ref 36.0–46.0)
Hemoglobin: 11.5 g/dL — ABNORMAL LOW (ref 12.0–15.0)
Immature Granulocytes: 1 %
Lymphocytes Relative: 12 %
Lymphs Abs: 0.9 10*3/uL (ref 0.7–4.0)
MCH: 29.7 pg (ref 26.0–34.0)
MCHC: 32.8 g/dL (ref 30.0–36.0)
MCV: 90.7 fL (ref 80.0–100.0)
Monocytes Absolute: 0.6 10*3/uL (ref 0.1–1.0)
Monocytes Relative: 9 %
Neutro Abs: 4.5 10*3/uL (ref 1.7–7.7)
Neutrophils Relative %: 65 %
Platelet Count: 145 10*3/uL — ABNORMAL LOW (ref 150–400)
RBC: 3.87 MIL/uL (ref 3.87–5.11)
RDW: 16.9 % — ABNORMAL HIGH (ref 11.5–15.5)
WBC Count: 6.9 10*3/uL (ref 4.0–10.5)
nRBC: 0 % (ref 0.0–0.2)

## 2022-01-01 LAB — TSH: TSH: 2.017 u[IU]/mL (ref 0.308–3.960)

## 2022-01-01 NOTE — Progress Notes (Signed)
?[facility]  ?     Telephone: [phone]?Fax: [phone]  ? ?Oncology Clinical Pharmacist Practitioner Initial Assessment ? ?Leslie Wright is a 56 y.o. female with a diagnosis of breast cancer. They were contacted today via in-person visit. ? ?Indication/Regimen ?Capecitabine (Xeloda?) is being used appropriately for treatment of breast cancer in the adjuvant setting by Dr. Nicholas Lose  ?Wt Readings from Last 1 Encounters:  ?01/01/22 261 lb 12.8 oz (118.8 kg)  ? ? Estimated body surface area is 2.37 meters squared as calculated from the following: ?  Height as of this encounter: 5' 7"  (1.702 m). ?  Weight as of this encounter: 261 lb 12.8 oz (118.8 kg). ? ?The dosing regimen is 2 tablets (1000 mg) by mouth in the morning and 2 tablets (1000 mg) in the evening on days 1 to 14 of a 21-day cycle. . This is being given  monotherapy. It is planned to continue until  6 months in the adjuvant setting per the Create-X trial data . Of note, patient was TNBC on the left breast and HR(+), HER2(-) on the right breast. Per Dr. Lindi Adie, antiestrogen therapy will start after capecitabine has finished.  [name] was seen today on initial assessment after being referred by Dr. Lindi Adie for her capecitabine management.  She reports starting this on March 14 and is just finished up her first cycle.  She is starting her second cycle tomorrow.  Overall, she states she is doing very well.  She has had some diarrhea, she reports every other day.  When she does have the diarrhea she reports about 2 or 3 episodes.  She states this is well controlled with loperamide.  She also has ondansetron and prochlorperazine at home for nausea which she has not had to take yet.  She does state that she has stopped the omeprazole due to the possible risk of interaction with capecitabine.  She is taking famotidine and she states that this is working very well.  She does have a history of blood clots that she was prescribed low-dose  aspirin for by Dr. Rush Farmer.  She also states that she is likely going to restart losartan for blood pressure which is prescribed by Dr. Ernie Hew her primary care physician.  Ms. Tewell states that she stopped losartan during chemotherapy because her blood pressure was running low. ? ?We went over the possible side effects of capecitabine today which include but are not limited to diarrhea, cardiotoxicity, dehydration, nausea, vomiting, hand-foot syndrome, stomatitis, fever, neutropenia, and fatigue.  We discussed the importance of drinking plenty of fluids and [name] says that she drinks Gatorade and has no problems drinking noncaffeinated beverages including water.  We also discussed hand-foot syndrome at length and discussed some preventative measures that can be taken.  We also went over storage and handling, that the medication should be taken every 12 hours, and then it should be taken 14 days on followed by a 7-day rest..  We also discussed that it should be taken within 30 minutes of a meal. We also provided written information on diet and the importance of  exercise. ? ?We will add labs and a visit with Dr. Lindi Adie in 3 weeks followed by labs and a follow-up pharmacy clinic visit in 6 weeks. ? ?Dose Modifications ?Dose reduced per Dr. Lindi Adie to ~422 mg/m2 every 12 hours 14 days on followed by a 7 day rest period ?Create-X trial was 1250 mg/m2 every 12 hours  ? ?Access Assessment ?  Leslie Wright will be receiving capecitabine through [facility] ?Insurance Concerns: none ?Start date if known: 12/12/21 ? ?Allergies ?No Known Allergies ? ?Vitals ? ?  01/01/2022  ? 10:55 AM 12/06/2021  ? 10:51 AM 11/13/2021  ?  3:09 PM  ?Vitals with BMI  ?Height 5' 7"  5' 7"    ?Weight 261 lbs 13 oz 253 lbs 2 oz 254 lbs 8 oz  ?BMI 40.99 39.63   ?Systolic 992 426   ?Diastolic 90 94   ?Pulse 69 71   ?  ? ?Laboratory Data ? ?  Latest Ref Rng & Units 01/01/2022  ? 10:38 AM 12/06/2021  ? 10:42 AM 09/07/2021  ? 10:46 AM  ?CBC  EXTENDED  ?WBC 4.0 - 10.5 K/uL 6.9   6.9   8.2    ?RBC 3.87 - 5.11 MIL/uL 3.87   3.96   3.23    ?Hemoglobin 12.0 - 15.0 g/dL 11.5   11.4   9.1    ?HCT 36.0 - 46.0 % 35.1   35.1   28.9    ?Platelets 150 - 400 K/uL 145   170   228    ?NEUT# 1.7 - 7.7 K/uL 4.5   5.0   6.1    ?Lymph# 0.7 - 4.0 K/uL 0.9   0.7   1.1    ?  ? ?  Latest Ref Rng & Units 01/01/2022  ? 10:38 AM 12/06/2021  ? 10:42 AM 10/09/2021  ?  3:00 PM  ?CMP  ?Glucose 70 - 99 mg/dL 122   93   124    ?BUN 6 - 20 mg/dL 13   14   11     ?Creatinine 0.44 - 1.00 mg/dL 1.07   1.09   1.24    ?Sodium 135 - 145 mmol/L 143   142   140    ?Potassium 3.5 - 5.1 mmol/L 3.6   3.7   3.9    ?Chloride 98 - 111 mmol/L 107   106   105    ?CO2 22 - 32 mmol/L 26   27   22     ?Calcium 8.9 - 10.3 mg/dL 9.0   9.8   9.2    ?Total Protein 6.5 - 8.1 g/dL 7.1   7.0     ?Total Bilirubin 0.3 - 1.2 mg/dL 0.3   0.3     ?Alkaline Phos 38 - 126 U/L 69   64     ?AST 15 - 41 U/L 12   10     ?ALT 0 - 44 U/L 11   8     ? ?Lab Results  ?Component Value Date  ? MG 1.6 (L) 04/25/2021  ? MG 1.8 04/23/2021  ?  ? ?Contraindications ?Contraindications were reviewed? Yes ?Contraindications to therapy were identified? No  ? ?Safety Precautions ?The following safety precautions for the use of capecitabine were reviewed:  ?Fever: reviewed the importance of having a thermometer and the Centers for Disease Control and Prevention (CDC) definition of fever which is 100.4?F (38?C) or higher. Patient should call 24/7 triage at [phone] if experiencing a fever or any other symptoms ?Diarrhea ?Cardiotoxicity ?Dehydration and renal failure ?Nausea ?Vomiting ?Palmar-plantar erythrodysesthesia (Hand-Foot Syndrome) ?Stomatitis ?Neutropenia ?Alopecia ?Fatigue ?Storage and Handling ?To be taken within 30 minutes of a meal ?Missed doses ? ?Medication Reconciliation ?Current Outpatient Medications  ?Medication Sig Dispense Refill  ? acetaminophen (TYLENOL) 500 MG tablet Take 1,000 mg by mouth every 6 (six) hours as needed  for moderate  pain, mild pain or headache.    ? aspirin EC 81 MG tablet Take 81 mg by mouth daily.    ? betamethasone valerate ointment (VALISONE) 0.1 % Apply 1 application topically 2 (two) times daily. 30 g 0  ? capecitabine (XELODA) 500 MG tablet Take 2 tablets (1,000 mg total) by mouth 2 (two) times daily after a meal. Take for 14 days, then hold for 7 days. Repeat every 21 days. 56 tablet 7  ? folic acid (FOLVITE) 1 MG tablet Take 1 mg by mouth daily.    ? ketoconazole (NIZORAL) 2 % cream Apply 1 application topically daily. 15 g 0  ? losartan (COZAAR) 100 MG tablet Take 1 tablet by mouth daily 90 tablet 3  ? omeprazole (PRILOSEC) 40 MG capsule Take 1 capsule (40 mg total) by mouth at bedtime. 60 capsule 0  ? ?No current facility-administered medications for this visit.  ? ? ?Medication reconciliation is based on the patient's most recent medication list in the electronic medical record (EMR) including herbal products and OTC medications.  ? ?The patient's medication list was reviewed today with the patient? Yes  ? ?Drug-drug interactions (DDIs) ?DDIs were evaluated? Yes ?Significant DDIs identified? No  ? ?Drug-Food Interactions ?Drug-food interactions were evaluated? Yes ?Drug-food interactions identified? No  ? ?Follow-up Plan  ?Will add labs and Dr. Lindi Adie visit in 3 weeks prior to start of cycle 3 of capecitabine (01/22/22) ?Labs, pharmacy clinic visit in 6 weeks prior to start of cycle 4 (02/12/22) ?Follow up with Dr. Ernie Hew regarding the restart of losartan ?Continue loperamide PRN for diarrhea, use anti-nausea meds if needed ? ?Ladona Ridgel participated in the discussion, expressed understanding, and voiced agreement with the above plan. All questions were answered to her satisfaction. The patient was advised to contact the clinic at [phone] with any questions or concerns prior to her return visit.  ? ?I spent 45 minutes assessing the patient. ? ?Raina Mina, RPH-CPP, 01/01/2022 11:49  AM ? ?**Disclaimer: This note was dictated with voice recognition software. Similar sounding words can inadvertently be transcribed and this note may contain transcription errors which may not have been corrected

## 2022-01-02 ENCOUNTER — Telehealth: Payer: Self-pay | Admitting: Pharmacist

## 2022-01-02 LAB — T4: T4, Total: 7.6 ug/dL (ref 4.5–12.0)

## 2022-01-02 NOTE — Telephone Encounter (Signed)
Scheduled appointment per 04/03 los. Left message.   ?

## 2022-01-08 NOTE — Progress Notes (Signed)
? ?Patient Care Team: ?Fanny Bien, MD as PCP - General (Family Medicine) ?Herminio Commons, MD (Inactive) as PCP - Cardiology (Cardiology) ?Mauro Kaufmann, RN as Oncology Nurse Navigator ?Rockwell Germany, RN as Oncology Nurse Navigator ?Stark Klein, MD as Consulting Physician (General Surgery) ?Kyung Rudd, MD as Consulting Physician (Radiation Oncology) ? ?DIAGNOSIS:  ?Encounter Diagnosis  ?Name Primary?  ? Malignant neoplasm of overlapping sites of left breast in female, estrogen receptor negative ([place])   ? ? ?SUMMARY OF ONCOLOGIC HISTORY: ?Oncology History  ?Malignant neoplasm of lower-outer quadrant of right breast of female, estrogen receptor positive ([place])  ?01/20/2021 Cancer Staging  ? Staging form: Breast, AJCC 8th Edition ?- Clinical stage from 01/20/2021: Stage IA (cT1c, cN0, cM0, G2, ER+, PR-, HER2-) - Signed by Chauncey Cruel, MD on 01/25/2021 ?Stage prefix: Initial diagnosis ?Histologic grading system: 3 grade system ? ?  ?01/23/2021 Initial Diagnosis  ? Malignant neoplasm of lower-outer quadrant of right breast of female, estrogen receptor positive ([place]) ? ?  ?02/10/2021 - 06/17/2021 Chemotherapy  ? Patient is on Treatment Plan : BREAST Pembrolizumab + Carboplatin D1 + Paclitaxel D1,8,15 q21d X 4 cycles / Pembrolizumab + AC q21d x 4 cycles  ? ?  ?  ? Genetic Testing  ? Negative genetic testing. No pathogenic variants identified on the [facility] Multi-Cancer Panel+RNA. VUS in ATM called c.1132A>G identified. The report date is 02/13/2021. ? ?The Multi-Cancer Panel + RNA offered by [facility] includes sequencing and/or deletion duplication testing of the following 84 genes: AIP, ALK, APC, ATM, AXIN2,BAP1,  BARD1, BLM, BMPR1A, BRCA1, BRCA2, BRIP1, CASR, CDC73, CDH1, CDK4, CDKN1B, CDKN1C, CDKN2A (p14ARF), CDKN2A (p16INK4a), CEBPA, CHEK2, CTNNA1, DICER1, DIS3L2, EGFR (c.2369C>T, p.Thr790Met variant only), EPCAM (Deletion/duplication testing only), FH, FLCN, GATA2, GPC3, GREM1 (Promoter region  deletion/duplication testing only), HOXB13 (c.251G>A, p.Gly84Glu), HRAS, KIT, MAX, MEN1, MET, MITF (c.952G>A, p.Glu318Lys variant only), MLH1, MSH2, MSH3, MSH6, MUTYH, NBN, NF1, NF2, NTHL1, PALB2, PDGFRA, PHOX2B, PMS2, POLD1, POLE, POT1, PRKAR1A, PTCH1, PTEN, RAD50, RAD51C, RAD51D, RB1, RECQL4, RET, RUNX1, SDHAF2, SDHA (sequence changes only), SDHB, SDHC, SDHD, SMAD4, SMARCA4, SMARCB1, SMARCE1, STK11, SUFU, TERC, TERT, TMEM127, TP53, TSC1, TSC2, VHL, WRN and WT1. ?  ?Malignant neoplasm of overlapping sites of left breast in female, estrogen receptor negative ([place])  ?01/20/2021 Cancer Staging  ? Staging form: Breast, AJCC 8th Edition ?- Clinical stage from 01/20/2021: Stage IIIB (cT3, cN0, cM0, G3, ER-, PR-, HER2-) - Signed by Chauncey Cruel, MD on 01/25/2021 ?Histologic grading system: 3 grade system ? ?  ?01/25/2021 Initial Diagnosis  ? Malignant neoplasm of overlapping sites of left breast in female, estrogen receptor negative ([place]) ? ?  ?02/10/2021 - 06/17/2021 Chemotherapy  ? Patient is on Treatment Plan : BREAST Pembrolizumab + Carboplatin D1 + Paclitaxel D1,8,15 q21d X 4 cycles / Pembrolizumab + AC q21d x 4 cycles  ? ?  ?  ? Genetic Testing  ? Negative genetic testing. No pathogenic variants identified on the [facility] Multi-Cancer Panel+RNA. VUS in ATM called c.1132A>G identified. The report date is 02/13/2021. ? ?The Multi-Cancer Panel + RNA offered by [facility] includes sequencing and/or deletion duplication testing of the following 84 genes: AIP, ALK, APC, ATM, AXIN2,BAP1,  BARD1, BLM, BMPR1A, BRCA1, BRCA2, BRIP1, CASR, CDC73, CDH1, CDK4, CDKN1B, CDKN1C, CDKN2A (p14ARF), CDKN2A (p16INK4a), CEBPA, CHEK2, CTNNA1, DICER1, DIS3L2, EGFR (c.2369C>T, p.Thr790Met variant only), EPCAM (Deletion/duplication testing only), FH, FLCN, GATA2, GPC3, GREM1 (Promoter region deletion/duplication testing only), HOXB13 (c.251G>A, p.Gly84Glu), HRAS, KIT, MAX, MEN1, MET, MITF (c.952G>A, p.Glu318Lys variant only), MLH1, MSH2,  MSH3,  MSH6, MUTYH, NBN, NF1, NF2, NTHL1, PALB2, PDGFRA, PHOX2B, PMS2, POLD1, POLE, POT1, PRKAR1A, PTCH1, PTEN, RAD50, RAD51C, RAD51D, RB1, RECQL4, RET, RUNX1, SDHAF2, SDHA (sequence changes only), SDHB, SDHC, SDHD, SMAD4, SMARCA4, SMARCB1, SMARCE1, STK11, SUFU, TERC, TERT, TMEM127, TP53, TSC1, TSC2, VHL, WRN and WT1. ?  ?07/28/2021 - 09/07/2021 Chemotherapy  ? Patient is on Treatment Plan : HEAD/NECK Pembrolizumab Q21D  ? ?  ?  ? ?CHIEF COMPLIANT: Follow-up of bilateral breast cancers ? ?INTERVAL HISTORY: Leslie Wright is a  56 y.o. with above-mentioned history of bilateral breast cancers undergone bilateral mastectomies. She presents to the clinic today for follow-up. Denies diarrhea and numbness tingling in hands and feet. She is tolerating the treatment fairly well.  She does have loose stools but no diarrhea.  She complains of mild to moderate fatigue.  She thinks that the fatigue is slowly increasing.  Did not have any nausea or vomiting.  Did not have any abdominal pain. ? ?ALLERGIES:  has No Known Allergies. ? ?MEDICATIONS:  ?Current Outpatient Medications  ?Medication Sig Dispense Refill  ? acetaminophen (TYLENOL) 500 MG tablet Take 1,000 mg by mouth every 6 (six) hours as needed for moderate pain, mild pain or headache. (Patient not taking: Reported on 01/01/2022)    ? aspirin EC 81 MG tablet Take 81 mg by mouth daily.    ? betamethasone valerate ointment (VALISONE) 0.1 % Apply 1 application topically 2 (two) times daily. (Patient not taking: Reported on 01/01/2022) 30 g 0  ? capecitabine (XELODA) 500 MG tablet Take 2 tablets (1,000 mg total) by mouth 2 (two) times daily after a meal. Take for 14 days, then hold for 7 days. Repeat every 21 days. 56 tablet 7  ? famotidine (PEPCID) 40 MG tablet Take 20 mg by mouth daily.    ? folic acid (FOLVITE) 1 MG tablet Take 1 mg by mouth daily. (Patient not taking: Reported on 01/01/2022)    ? ketoconazole (NIZORAL) 2 % cream Apply 1 application topically daily. (Patient not  taking: Reported on 01/01/2022) 15 g 0  ? loperamide (IMODIUM) 2 MG capsule Take by mouth as needed for diarrhea or loose stools. Take 2 tabs (4 mg) with first loose stool, then 1 tab (2 mg) with each additional loose stool. Do not take more than 8 tabs (16 mg) in a 24-hour period.    ? losartan (COZAAR) 100 MG tablet Take 1 tablet by mouth daily (Patient not taking: Reported on 01/01/2022) 90 tablet 3  ? ondansetron (ZOFRAN) 8 MG tablet Take 1 tablet (8 mg total) by mouth 2 (two) times daily as needed. Start on the third day after carboplatin and AC chemotherapy. (Patient not taking: Reported on 01/01/2022) 30 tablet 1  ? prochlorperazine (COMPAZINE) 10 MG tablet Take 1 tablet (10 mg total) by mouth every 6 (six) hours as needed (Nausea or vomiting). (Patient not taking: Reported on 01/01/2022) 30 tablet 1  ? ?No current facility-administered medications for this visit.  ? ? ?PHYSICAL EXAMINATION: ?ECOG PERFORMANCE STATUS: 1 - Symptomatic but completely ambulatory ? ?Vitals:  ? 01/22/22 1145  ?BP: (!) 157/100  ?Pulse: 96  ?Resp: 18  ?Temp: 97.7 ?F (36.5 ?C)  ?SpO2: 96%  ? ?Filed Weights  ? 01/22/22 1145  ?Weight: 263 lb (119.3 kg)  ? ?  ? ?LABORATORY DATA:  ?I have reviewed the data as listed ? ?  Latest Ref Rng & Units 01/22/2022  ? 11:30 AM 01/01/2022  ? 10:38 AM 12/06/2021  ? 10:42 AM  ?  CMP  ?Glucose 70 - 99 mg/dL 116   122   93    ?BUN 6 - 20 mg/dL _0 ?Creatinine 0.44 - 1.00 mg/dL 1.17   1.07   1.09    ?Sodium 135 - 145 mmol/L 143   143   142    ?Potassium 3.5 - 5.1 mmol/L 3.8   3.6   3.7    ?Chloride 98 - 111 mmol/L 107   107   106    ?CO2 22 - 32 mmol/L _1 ?Calcium 8.9 - 10.3 mg/dL 9.3   9.0   9.8    ?Total Protein 6.5 - 8.1 g/dL 7.4   7.1   7.0    ?Total Bilirubin 0.3 - 1.2 mg/dL 0.3   0.3   0.3    ?Alkaline Phos 38 - 126 U/L 76   69   64    ?AST 15 - 41 U/L _2 ?ALT 0 - 44 U/L _3 ? ? ?Lab Results  ?Component Value Date  ? WBC 8.3 01/22/2022  ? HGB 12.2 01/22/2022  ? HCT  37.4 01/22/2022  ? MCV 93.7 01/22/2022  ? PLT 153 01/22/2022  ? NEUTROABS 5.8 01/22/2022  ? ? ?ASSESSMENT & PLAN:  ?Malignant neoplasm of overlapping sites of left breast in female, estrogen receptor negative ([place])

## 2022-01-12 ENCOUNTER — Other Ambulatory Visit (HOSPITAL_COMMUNITY): Payer: Self-pay

## 2022-01-17 ENCOUNTER — Other Ambulatory Visit (HOSPITAL_COMMUNITY): Payer: Self-pay

## 2022-01-18 ENCOUNTER — Other Ambulatory Visit: Payer: Self-pay

## 2022-01-18 DIAGNOSIS — Z171 Estrogen receptor negative status [ER-]: Secondary | ICD-10-CM

## 2022-01-22 ENCOUNTER — Other Ambulatory Visit: Payer: Self-pay

## 2022-01-22 ENCOUNTER — Inpatient Hospital Stay (HOSPITAL_BASED_OUTPATIENT_CLINIC_OR_DEPARTMENT_OTHER): Payer: 59 | Admitting: Hematology and Oncology

## 2022-01-22 ENCOUNTER — Inpatient Hospital Stay: Payer: 59

## 2022-01-22 DIAGNOSIS — C50812 Malignant neoplasm of overlapping sites of left female breast: Secondary | ICD-10-CM

## 2022-01-22 DIAGNOSIS — R5383 Other fatigue: Secondary | ICD-10-CM | POA: Diagnosis not present

## 2022-01-22 DIAGNOSIS — Z171 Estrogen receptor negative status [ER-]: Secondary | ICD-10-CM | POA: Diagnosis not present

## 2022-01-22 DIAGNOSIS — R197 Diarrhea, unspecified: Secondary | ICD-10-CM | POA: Diagnosis not present

## 2022-01-22 DIAGNOSIS — C50512 Malignant neoplasm of lower-outer quadrant of left female breast: Secondary | ICD-10-CM | POA: Diagnosis not present

## 2022-01-22 DIAGNOSIS — Z170 Estrogen receptor positive status [ER+]: Secondary | ICD-10-CM | POA: Diagnosis not present

## 2022-01-22 DIAGNOSIS — C50811 Malignant neoplasm of overlapping sites of right female breast: Secondary | ICD-10-CM | POA: Diagnosis not present

## 2022-01-22 DIAGNOSIS — Z79899 Other long term (current) drug therapy: Secondary | ICD-10-CM | POA: Diagnosis not present

## 2022-01-22 LAB — CMP (CANCER CENTER ONLY)
ALT: 10 U/L (ref 0–44)
AST: 12 U/L — ABNORMAL LOW (ref 15–41)
Albumin: 4.1 g/dL (ref 3.5–5.0)
Alkaline Phosphatase: 76 U/L (ref 38–126)
Anion gap: 11 (ref 5–15)
BUN: 18 mg/dL (ref 6–20)
CO2: 25 mmol/L (ref 22–32)
Calcium: 9.3 mg/dL (ref 8.9–10.3)
Chloride: 107 mmol/L (ref 98–111)
Creatinine: 1.17 mg/dL — ABNORMAL HIGH (ref 0.44–1.00)
GFR, Estimated: 54 mL/min — ABNORMAL LOW (ref >60)
Glucose, Bld: 116 mg/dL — ABNORMAL HIGH (ref 70–99)
Potassium: 3.8 mmol/L (ref 3.5–5.1)
Sodium: 143 mmol/L (ref 135–145)
Total Bilirubin: 0.3 mg/dL (ref 0.3–1.2)
Total Protein: 7.4 g/dL (ref 6.5–8.1)

## 2022-01-22 LAB — CBC WITH DIFFERENTIAL (CANCER CENTER ONLY)
Abs Immature Granulocytes: 0.07 10*3/uL (ref 0.00–0.07)
Basophils Absolute: 0 10*3/uL (ref 0.0–0.1)
Basophils Relative: 0 %
Eosinophils Absolute: 0.6 10*3/uL — ABNORMAL HIGH (ref 0.0–0.5)
Eosinophils Relative: 7 %
HCT: 37.4 % (ref 36.0–46.0)
Hemoglobin: 12.2 g/dL (ref 12.0–15.0)
Immature Granulocytes: 1 %
Lymphocytes Relative: 14 %
Lymphs Abs: 1.1 10*3/uL (ref 0.7–4.0)
MCH: 30.6 pg (ref 26.0–34.0)
MCHC: 32.6 g/dL (ref 30.0–36.0)
MCV: 93.7 fL (ref 80.0–100.0)
Monocytes Absolute: 0.7 10*3/uL (ref 0.1–1.0)
Monocytes Relative: 9 %
Neutro Abs: 5.8 10*3/uL (ref 1.7–7.7)
Neutrophils Relative %: 69 %
Platelet Count: 153 10*3/uL (ref 150–400)
RBC: 3.99 MIL/uL (ref 3.87–5.11)
RDW: 17.7 % — ABNORMAL HIGH (ref 11.5–15.5)
WBC Count: 8.3 10*3/uL (ref 4.0–10.5)
nRBC: 0 % (ref 0.0–0.2)

## 2022-01-22 LAB — TSH: TSH: 2.758 u[IU]/mL (ref 0.350–4.500)

## 2022-01-22 NOTE — Assessment & Plan Note (Addendum)
right?breast, a clinical T2 N0-1, stage IIA-IIB invasive ductal carcinoma, grade 2, estrogen receptor positive, progesterone receptor and HER2 negative, with an MIB-1 of 5% ?? ?left breast, a clinical T3 N0, stage IIIB?invasive ductal carcinoma, grade 3, functionally triple negative,?with an MIB-1 of 25% (HER2 results are pending). ?? ?neoadjuvant chemotherapy started?02/09/2021 consisting of pembrolizumab/ carboplatin/ paclitaxel stopped after 3 cycles due to peripheral neuropathy followed by pembrolizumab/ doxorubicin/ cyclophosphamide?x4 started 04/13/2021, completed?on 06/15/2021 ?? ?Adjuvant radiation completed 11/27/2021 ?Treatment plan:  ?1. Adjuvant capecitabine 1000 mg p.o. twice daily 14 days on 7 days off for 6 months ?2. antiestrogens for the right-sided breast cancer: Will start after Xeloda is complete ? ?Xeloda toxicities: ?1.  Intermittent loose stools ?2. mild to moderate fatigue ? ?Labs reviewed ? ?

## 2022-01-23 ENCOUNTER — Telehealth: Payer: Self-pay | Admitting: Hematology and Oncology

## 2022-01-23 NOTE — Telephone Encounter (Signed)
Scheduled appointment per 4/24 los. Left message. ?

## 2022-01-24 DIAGNOSIS — J9601 Acute respiratory failure with hypoxia: Secondary | ICD-10-CM | POA: Diagnosis not present

## 2022-01-25 LAB — T4, FREE

## 2022-01-30 ENCOUNTER — Other Ambulatory Visit (HOSPITAL_COMMUNITY): Payer: Self-pay

## 2022-02-08 ENCOUNTER — Other Ambulatory Visit (HOSPITAL_COMMUNITY): Payer: Self-pay

## 2022-02-09 ENCOUNTER — Other Ambulatory Visit: Payer: Self-pay | Admitting: *Deleted

## 2022-02-09 DIAGNOSIS — C50511 Malignant neoplasm of lower-outer quadrant of right female breast: Secondary | ICD-10-CM

## 2022-02-12 ENCOUNTER — Inpatient Hospital Stay: Payer: 59 | Attending: Oncology | Admitting: Pharmacist

## 2022-02-12 ENCOUNTER — Other Ambulatory Visit: Payer: Self-pay

## 2022-02-12 ENCOUNTER — Inpatient Hospital Stay: Payer: 59

## 2022-02-12 VITALS — BP 138/84 | HR 59 | Temp 97.5°F | Resp 17 | Ht 67.0 in | Wt 265.1 lb

## 2022-02-12 DIAGNOSIS — C50511 Malignant neoplasm of lower-outer quadrant of right female breast: Secondary | ICD-10-CM

## 2022-02-12 DIAGNOSIS — C50811 Malignant neoplasm of overlapping sites of right female breast: Secondary | ICD-10-CM | POA: Insufficient documentation

## 2022-02-12 DIAGNOSIS — Z79899 Other long term (current) drug therapy: Secondary | ICD-10-CM | POA: Diagnosis not present

## 2022-02-12 DIAGNOSIS — Z171 Estrogen receptor negative status [ER-]: Secondary | ICD-10-CM | POA: Diagnosis not present

## 2022-02-12 LAB — CBC WITH DIFFERENTIAL (CANCER CENTER ONLY)
Abs Immature Granulocytes: 0.05 10*3/uL (ref 0.00–0.07)
Basophils Absolute: 0 10*3/uL (ref 0.0–0.1)
Basophils Relative: 0 %
Eosinophils Absolute: 1.8 10*3/uL — ABNORMAL HIGH (ref 0.0–0.5)
Eosinophils Relative: 22 %
HCT: 35.5 % — ABNORMAL LOW (ref 36.0–46.0)
Hemoglobin: 12.2 g/dL (ref 12.0–15.0)
Immature Granulocytes: 1 %
Lymphocytes Relative: 13 %
Lymphs Abs: 1.1 10*3/uL (ref 0.7–4.0)
MCH: 32.1 pg (ref 26.0–34.0)
MCHC: 34.4 g/dL (ref 30.0–36.0)
MCV: 93.4 fL (ref 80.0–100.0)
Monocytes Absolute: 0.6 10*3/uL (ref 0.1–1.0)
Monocytes Relative: 8 %
Neutro Abs: 4.6 10*3/uL (ref 1.7–7.7)
Neutrophils Relative %: 56 %
Platelet Count: 145 10*3/uL — ABNORMAL LOW (ref 150–400)
RBC: 3.8 MIL/uL — ABNORMAL LOW (ref 3.87–5.11)
RDW: 16.8 % — ABNORMAL HIGH (ref 11.5–15.5)
WBC Count: 8.1 10*3/uL (ref 4.0–10.5)
nRBC: 0 % (ref 0.0–0.2)

## 2022-02-12 LAB — CMP (CANCER CENTER ONLY)
ALT: 10 U/L (ref 0–44)
AST: 12 U/L — ABNORMAL LOW (ref 15–41)
Albumin: 4 g/dL (ref 3.5–5.0)
Alkaline Phosphatase: 76 U/L (ref 38–126)
Anion gap: 9 (ref 5–15)
BUN: 15 mg/dL (ref 6–20)
CO2: 25 mmol/L (ref 22–32)
Calcium: 9.1 mg/dL (ref 8.9–10.3)
Chloride: 107 mmol/L (ref 98–111)
Creatinine: 1.24 mg/dL — ABNORMAL HIGH (ref 0.44–1.00)
GFR, Estimated: 51 mL/min — ABNORMAL LOW (ref >60)
Glucose, Bld: 97 mg/dL (ref 70–99)
Potassium: 3.6 mmol/L (ref 3.5–5.1)
Sodium: 141 mmol/L (ref 135–145)
Total Bilirubin: 0.3 mg/dL (ref 0.3–1.2)
Total Protein: 7.2 g/dL (ref 6.5–8.1)

## 2022-02-12 NOTE — Progress Notes (Signed)
?[facility]  ?     Telephone: [phone]?Fax: [phone]  ? ?Oncology Clinical Pharmacist Practitioner Progress Note ? ?Leslie Wright was contacted via in-person visit to discuss her chemotherapy regimen for capecitabine which they receive under the care of Dr. Nicholas Lose.  ? ?Current treatment regimen and start date ?Capecitabine (12/12/21) ? ?Interval History ?She continues on capecitabine 2 tablets (1000 mg) in the morning and 2 tablets (1000 mg) in the evening on days 1 to 14 of a 21-day cycle. This is being given  monotherapy. Therapy is planned to continue until  6 months in the adjuvant setting (September 2023 .  ? ?Response to Therapy ?Leslie Wright is doing well.  She had some N/V/D after starting her last cycle of capecitabine. She states the symptoms started on Friday (01/26/22). She did not take capecitabine in this day but then restarted on 01/27/22. She states symptoms started to resolve on Sunday (01/28/22). Leslie Wright feels this was likely an infectious process which seems reasonable as she has had no other symptoms since then and continued capecitabine as scheduled.  She starts her next cycle tomorrow and has already received the capec[facility]abine from [facility].  She continues to use lotions for hands/feet and states besides some minor tingling, is not experiencing any other symptoms of palmar-plantar erythrodysesthesia. Her serum creatinine is slightly elevated at 1.24 mg/dL, which is up from 1.17 mg/dL on 01/22/22. We discussed again today the importance of drinking plenty of fluids and monitoring urine output.  She has held losartan since having low blood pressure when on chemotherapy last year. This was held by Dr. Jana Hakim at the time. Her losartan is managed by her PCP Dr. Rachell Cipro.  Leslie Wright states she will be making an appointment with her and may restart losartan at that time.  Her current prescription has expired, being written over a year ago.  ? ?We did review with [name] today that her absolute eosinophil count is elevated today at 1800 cells/uL. Up from 600 cells/uL on 01/22/22. This will be reviewed again when she sees Leslie Bihari NP next. Unlikely due from capecitabine. Other possible medication causes would be aspirin but she has been on aspirin since 2019 due to multiple clots in her right leg after having a total hip replacement by Dr. Rush Farmer on her left side. Labs, vitals, treatment parameters, and manufacturer guidelines assessing toxicity were reviewed with Leslie Wright today. Based on these values, patient is in agreement to continue therapy at this time. ? ?Allergies ?No Known Allergies ? ?Vitals ? ?  02/12/2022  ?  1:00 PM 01/22/2022  ? 11:45 AM 01/01/2022  ? 10:55 AM  ?Vitals with BMI  ?Height '5\' 7"'$  '5\' 7"'$  '5\' 7"'$   ?Weight 265 lbs 2 oz 263 lbs 261 lbs 13 oz  ?BMI 41.51 41.18 40.99  ?Systolic 081 448 185  ?Diastolic 84 631 90  ?Pulse 59 96 69  ?  ? ?Laboratory Data ? ?  Latest Ref Rng & Units 02/12/2022  ? 12:21 PM 01/22/2022  ? 11:30 AM 01/01/2022  ? 10:38 AM  ?CBC EXTENDED  ?WBC 4.0 - 10.5 K/uL 8.1   8.3   6.9    ?RBC 3.87 - 5.11 MIL/uL 3.80   3.99   3.87    ?Hemoglobin 12.0 - 15.0 g/dL 12.2   12.2   11.5    ?HCT 36.0 - 46.0 % 35.5   37.4   35.1    ?Platelets 150 -  400 K/uL 145   153   145    ?NEUT# 1.7 - 7.7 K/uL 4.6   5.8   4.5    ?Lymph# 0.7 - 4.0 K/uL 1.1   1.1   0.9    ?  ? ?  Latest Ref Rng & Units 02/12/2022  ? 12:21 PM 01/22/2022  ? 11:30 AM 01/01/2022  ? 10:38 AM  ?CMP  ?Glucose 70 - 99 mg/dL 97   116   122    ?BUN 6 - 20 mg/dL '15   18   13    '$ ?Creatinine 0.44 - 1.00 mg/dL 1.24   1.17   1.07    ?Sodium 135 - 145 mmol/L 141   143   143    ?Potassium 3.5 - 5.1 mmol/L 3.6   3.8   3.6    ?Chloride 98 - 111 mmol/L 107   107   107    ?CO2 22 - 32 mmol/L '25   25   26    '$ ?Calcium 8.9 - 10.3 mg/dL 9.1   9.3   9.0    ?Total Protein 6.5 - 8.1 g/dL 7.2   7.4   7.1    ?Total Bilirubin 0.3 - 1.2 mg/dL 0.3   0.3   0.3    ?Alkaline Phos 38 - 126 U/L 76   76   69    ?AST 15 - 41  U/L '12   12   12    '$ ?ALT 0 - 44 U/L '10   10   11    '$ ?  ?Lab Results  ?Component Value Date  ? MG 1.6 (L) 04/25/2021  ? MG 1.8 04/23/2021  ?  ? ?Adverse Effects Assessment ?N/V/D: as above, started Friday (01/26/22) after starting last cycle of capecitabine on 01/23/22. Used ondansetron, loperamide, and bismuth subsalicylate (Pepto Bismol) with success.  No symptoms currently.  ?Eosinophilia: monitor with repeat CBC w/diff at next visit. Could be caused by numerous conditions. Unlikely from capecitabine and has been on low dose aspirin since 2019. ?Fatigue: resolved ? ?Adherence Assessment ?Leslie Wright reports missing 2 doses over the past 3 weeks.   ?Reason for missed dose: missed 1 day (2 doses) on Friday 01/26/22 due to not feeling well and N/V/D. ?Patient was re-educated on importance of adherence.  ? ?Access Assessment ?Leslie Wright is currently receiving her capecitabine through [facility]  ?Insurance concerns:  none ? ?Medication Reconciliation ?The patient's medication list was reviewed today with the patient? Yes ?New medications or herbal supplements have recently been started?  Not taking losartan and prescription managed by Dr. Ernie Hew. Leslie Wright will schedule appointment to see Dr. Ernie Hew for blood pressure management as she has prescribed losartan the past.  ?Any medications have been discontinued? No  ?The medication list was updated and reconciled based on the patient's most recent medication list in the electronic medical record (EMR) including herbal products and OTC medications.  ? ?Medications ?Current Outpatient Medications  ?Medication Sig Dispense Refill  ? acetaminophen (TYLENOL) 500 MG tablet Take 1,000 mg by mouth every 6 (six) hours as needed for moderate pain, mild pain or headache.    ? aspirin EC 81 MG tablet Take 81 mg by mouth daily.    ? betamethasone valerate ointment (VALISONE) 0.1 % Apply 1 application topically 2 (two) times daily. 30 g 0  ? capecitabine  (XELODA) 500 MG tablet Take 2 tablets (1,000 mg total) by mouth 2 (two) times daily after a  meal. Take for 14 days, then hold for 7 days. Repeat every 21 days. 56 tablet 7  ? famotidine (PEPCID) 40 MG tablet Take 20 mg by mouth daily.    ? folic acid (FOLVITE) 1 MG tablet Take 1 mg by mouth daily.    ? ketoconazole (NIZORAL) 2 % cream Apply 1 application topically daily. 15 g 0  ? loperamide (IMODIUM) 2 MG capsule Take by mouth as needed for diarrhea or loose stools. Take 2 tabs (4 mg) with first loose stool, then 1 tab (2 mg) with each additional loose stool. Do not take more than 8 tabs (16 mg) in a 24-hour period. (Patient not taking: Reported on 02/12/2022)    ? losartan (COZAAR) 100 MG tablet Take 1 tablet by mouth daily (Patient not taking: Reported on 01/01/2022) 90 tablet 3  ? ondansetron (ZOFRAN) 8 MG tablet Take 1 tablet (8 mg total) by mouth 2 (two) times daily as needed. Start on the third day after carboplatin and AC chemotherapy. (Patient not taking: Reported on 01/01/2022) 30 tablet 1  ? prochlorperazine (COMPAZINE) 10 MG tablet Take 1 tablet (10 mg total) by mouth every 6 (six) hours as needed (Nausea or vomiting). (Patient not taking: Reported on 01/01/2022) 30 tablet 1  ? ?No current facility-administered medications for this visit.  ? ? ?Drug-Drug Interactions (DDIs) ?DDIs were evaluated? Yes ?Significant DDIs? No  ?The patient was instructed to speak with their health care provider and/or the oral chemotherapy pharmacist before starting any new drug, including prescription or over the counter, natural / herbal products, or vitamins. ? ?Supportive Care ?Palmar-plantar erythrodysesthesia: following recommendations for avoiding palmar-plantar erythrodysesthesia.  ?Using loperamide for diarrhea as needed; using ondansetron for nausea as needed.  Only once with last cycle ? ?Dosing Assessment ?Hepatic adjustments needed? No  ?Renal adjustments needed? No  ?Toxicity adjustments needed? No  ?The current dosing  regimen is appropriate to continue at this time. ? ?Follow-Up Plan ?Continue capecitabine 1000 mg BID 14/21. Starts new cycle (cycle 4) tomorrow.  ?Follow up with Leslie Bihari NP on 03/06/22 prior to cycle 5

## 2022-02-19 ENCOUNTER — Ambulatory Visit: Payer: 59 | Attending: General Surgery

## 2022-02-19 ENCOUNTER — Other Ambulatory Visit (HOSPITAL_COMMUNITY): Payer: Self-pay

## 2022-02-19 VITALS — Wt 268.0 lb

## 2022-02-19 DIAGNOSIS — Z483 Aftercare following surgery for neoplasm: Secondary | ICD-10-CM | POA: Insufficient documentation

## 2022-02-19 NOTE — Therapy (Addendum)
OUTPATIENT PHYSICAL THERAPY SOZO SCREENING NOTE   Patient Name: Leslie Wright MRN: [id number] DOB:[birth year], 56 y.o., female Today's Date: 02/19/2022  PCP: Fanny Bien, MD REFERRING PROVIDER: Stark Klein, MD   PT End of Session - 02/19/22 1504     Visit Number 6   # unchanged due to screen only   PT Start Time 1502    PT Stop Time 1507    PT Time Calculation (min) 5 min    Activity Tolerance Patient tolerated treatment well    Behavior During Therapy [facility] for tasks assessed/performed             Past Medical History:  Diagnosis Date   Acid reflux    Anemia    Arthritis    "maybe in my fingers" (03/26/2018)   Breast cancer ([place]) 12/2020   both sides   Concussion 2001   no deficit had tia right after no problems since   Family history of adverse reaction to anesthesia    "brother, Timmy, and my mom always get sick" (03/26/2018)   Family history of brain cancer    Family history of breast cancer    Family history of colon cancer    Family history of lung cancer    Family history of pancreatic cancer    Family history of prostate cancer    Headache    High blood pressure    High cholesterol    Hip pain    "left; before OR" (03/26/2018)   History of chemotherapy last done 02-17-2021   History of hiatal hernia    History of kidney stones    OSA (obstructive sleep apnea)    "can't tolerate the mask" (03/26/2018)    PONV (postoperative nausea and vomiting)    does not need much anesthesia, slow to awlaekn in past   Pre-diabetes    Shortness of breath    with exertion   TIA (transient ischemic attack)    15 years ago   Wears partial dentures upper   Past Surgical History:  Procedure Laterality Date   CARPAL TUNNEL RELEASE Right ~ 2006   JOINT REPLACEMENT Left    total left hip   LAPAROSCOPIC CHOLECYSTECTOMY  12/2010   LASIK Bilateral 2000   PORT A CATH REVISION Left 02/21/2021   Procedure: PORT A CATH REVISION;  Surgeon: Stark Klein, MD;  Location:  [place];  Service: General;  Laterality: Left;  60 MINUTES ROOM 5   PORT-A-CATH REMOVAL Left 10/11/2021   Procedure: PORT REMOVAL;  Surgeon: Stark Klein, MD;  Location: [place];  Service: General;  Laterality: Left;   PORTACATH PLACEMENT N/A 02/08/2021   Procedure: INSERTION PORT-A-CATH;  Surgeon: Stark Klein, MD;  Location: [facility];  Service: General;  Laterality: N/A;   RADIOACTIVE SEED GUIDED AXILLARY SENTINEL LYMPH NODE Right 08/08/2021   Procedure: RADIOACTIVE SEED GUIDED RIGHT AXILLARY SENTINEL LYMPH NODE BIOPSY;  Surgeon: Stark Klein, MD;  Location: [place];  Service: General;  Laterality: Right;   SENTINEL NODE BIOPSY Bilateral 08/08/2021   Procedure: BILATERAL SENTINEL NODE BIOPSY;  Surgeon: Stark Klein, MD;  Location: [place];  Service: General;  Laterality: Bilateral;   TOTAL HIP ARTHROPLASTY Left 03/25/2018   Procedure: LEFT TOTAL HIP ARTHROPLASTY ANTERIOR APPROACH;  Surgeon: Mcarthur Rossetti, MD;  Location: [place];  Service: Orthopedics;  Laterality: Left;   TOTAL MASTECTOMY Bilateral 08/08/2021   Procedure: BILATERAL TOTAL MASTECTOMY;  Surgeon: Stark Klein, MD;  Location: [place];  Service: General;  Laterality: Bilateral;   Patient Active Problem List   Diagnosis Date Noted   Bilateral breast cancer ([place]) 08/08/2021   CKD (chronic kidney disease) stage 3, GFR 30-59 ml/min ([place]) 04/21/2021   Port-A-Cath in place 03/09/2021   Encounter for antineoplastic chemotherapy 02/17/2021   Encounter for immunotherapy 02/17/2021   Genetic testing 02/14/2021   Hepatic steatosis 02/09/2021   Multilevel degenerative disc disease 02/09/2021   Family history of breast cancer    Family history of pancreatic cancer    Family history of colon cancer    Family history of lung cancer    Family history of prostate cancer    Family history of brain cancer    Malignant neoplasm of overlapping sites of left breast in female, estrogen receptor negative ([place])  01/25/2021   Malignant neoplasm of lower-outer quadrant of right breast of female, estrogen receptor positive ([place]) 01/23/2021   Status post left hip replacement 05/19/2018   Acute deep vein thrombosis (DVT) of distal end of right lower extremity ([place]) 05/19/2018   Unilateral primary osteoarthritis, left hip 03/25/2018   Status post total replacement of left hip 03/25/2018   Hyperlipidemia 04/11/2017   Class 3 obesity with serious comorbidity and body mass index (BMI) of 40.0 to 44.9 in adult 04/11/2017   Prediabetes 02/04/2017   Mixed hyperlipidemia 01/09/2017   Insulin resistance 11/27/2016   Essential hypertension 10/25/2016   Morbid obesity with BMI of 40.0-44.9, adult ([place]) 10/25/2016   Vitamin D deficiency 10/25/2016    REFERRING DIAG: bilateral breast cancer at risk for lymphedema  THERAPY DIAG:  Aftercare following surgery for neoplasm  PERTINENT HISTORY: Bilateral breast cancer - R breast IDC grade 2 ER+ Her2-; L breast IDC grade 3 DCIS triple negative, completed neoadjuvant chemo, ; bilateral mastectomy 08/08/21 R SLNB (0/2), L SLNB (0/4), completed radiation   PRECAUTIONS: bilateral UE Lymphedema risk, None  SUBJECTIVE: Pt returns for her 3 month L-Dex screen.   PAIN:  Are you having pain? No  SOZO SCREENING: Patient was assessed today using the SOZO machine to determine the lymphedema index score. This was compared to her baseline score. It was determined that she is within the recommended range when compared to her baseline and no further action is needed at this time. She will continue SOZO screenings. These are done every 3 months for 2 years post operatively followed by every 6 months for 2 years, and then annually.     Otelia Limes, PTA 02/19/2022, 3:08 PM

## 2022-03-01 ENCOUNTER — Other Ambulatory Visit (HOSPITAL_COMMUNITY): Payer: Self-pay

## 2022-03-05 ENCOUNTER — Other Ambulatory Visit: Payer: Self-pay | Admitting: *Deleted

## 2022-03-05 ENCOUNTER — Ambulatory Visit: Payer: 59 | Admitting: Adult Health

## 2022-03-05 DIAGNOSIS — Z170 Estrogen receptor positive status [ER+]: Secondary | ICD-10-CM

## 2022-03-06 ENCOUNTER — Other Ambulatory Visit (HOSPITAL_COMMUNITY): Payer: Self-pay

## 2022-03-06 ENCOUNTER — Other Ambulatory Visit: Payer: Self-pay

## 2022-03-06 ENCOUNTER — Inpatient Hospital Stay: Payer: 59 | Attending: Oncology | Admitting: Adult Health

## 2022-03-06 ENCOUNTER — Inpatient Hospital Stay: Payer: 59

## 2022-03-06 VITALS — BP 148/87 | HR 71 | Temp 97.7°F | Resp 16 | Ht 67.0 in | Wt 266.8 lb

## 2022-03-06 DIAGNOSIS — C50812 Malignant neoplasm of overlapping sites of left female breast: Secondary | ICD-10-CM

## 2022-03-06 DIAGNOSIS — Z808 Family history of malignant neoplasm of other organs or systems: Secondary | ICD-10-CM | POA: Insufficient documentation

## 2022-03-06 DIAGNOSIS — Z803 Family history of malignant neoplasm of breast: Secondary | ICD-10-CM | POA: Insufficient documentation

## 2022-03-06 DIAGNOSIS — C50911 Malignant neoplasm of unspecified site of right female breast: Secondary | ICD-10-CM | POA: Diagnosis not present

## 2022-03-06 DIAGNOSIS — Z171 Estrogen receptor negative status [ER-]: Secondary | ICD-10-CM | POA: Diagnosis not present

## 2022-03-06 DIAGNOSIS — I10 Essential (primary) hypertension: Secondary | ICD-10-CM

## 2022-03-06 DIAGNOSIS — C50912 Malignant neoplasm of unspecified site of left female breast: Secondary | ICD-10-CM | POA: Diagnosis not present

## 2022-03-06 DIAGNOSIS — Z801 Family history of malignant neoplasm of trachea, bronchus and lung: Secondary | ICD-10-CM | POA: Insufficient documentation

## 2022-03-06 DIAGNOSIS — C50511 Malignant neoplasm of lower-outer quadrant of right female breast: Secondary | ICD-10-CM

## 2022-03-06 DIAGNOSIS — Z8042 Family history of malignant neoplasm of prostate: Secondary | ICD-10-CM | POA: Insufficient documentation

## 2022-03-06 DIAGNOSIS — C50811 Malignant neoplasm of overlapping sites of right female breast: Secondary | ICD-10-CM | POA: Insufficient documentation

## 2022-03-06 DIAGNOSIS — Z79899 Other long term (current) drug therapy: Secondary | ICD-10-CM | POA: Diagnosis not present

## 2022-03-06 DIAGNOSIS — Z87891 Personal history of nicotine dependence: Secondary | ICD-10-CM | POA: Insufficient documentation

## 2022-03-06 DIAGNOSIS — Z170 Estrogen receptor positive status [ER+]: Secondary | ICD-10-CM | POA: Insufficient documentation

## 2022-03-06 LAB — CBC WITH DIFFERENTIAL (CANCER CENTER ONLY)
Abs Immature Granulocytes: 0.04 10*3/uL (ref 0.00–0.07)
Basophils Absolute: 0 10*3/uL (ref 0.0–0.1)
Basophils Relative: 1 %
Eosinophils Absolute: 0.6 10*3/uL — ABNORMAL HIGH (ref 0.0–0.5)
Eosinophils Relative: 10 %
HCT: 37 % (ref 36.0–46.0)
Hemoglobin: 12.4 g/dL (ref 12.0–15.0)
Immature Granulocytes: 1 %
Lymphocytes Relative: 19 %
Lymphs Abs: 1.2 10*3/uL (ref 0.7–4.0)
MCH: 32.1 pg (ref 26.0–34.0)
MCHC: 33.5 g/dL (ref 30.0–36.0)
MCV: 95.9 fL (ref 80.0–100.0)
Monocytes Absolute: 0.6 10*3/uL (ref 0.1–1.0)
Monocytes Relative: 10 %
Neutro Abs: 3.7 10*3/uL (ref 1.7–7.7)
Neutrophils Relative %: 59 %
Platelet Count: 149 10*3/uL — ABNORMAL LOW (ref 150–400)
RBC: 3.86 MIL/uL — ABNORMAL LOW (ref 3.87–5.11)
RDW: 15.9 % — ABNORMAL HIGH (ref 11.5–15.5)
WBC Count: 6.2 10*3/uL (ref 4.0–10.5)
nRBC: 0 % (ref 0.0–0.2)

## 2022-03-06 LAB — CMP (CANCER CENTER ONLY)
ALT: 10 U/L (ref 0–44)
AST: 12 U/L — ABNORMAL LOW (ref 15–41)
Albumin: 3.9 g/dL (ref 3.5–5.0)
Alkaline Phosphatase: 74 U/L (ref 38–126)
Anion gap: 8 (ref 5–15)
BUN: 13 mg/dL (ref 6–20)
CO2: 26 mmol/L (ref 22–32)
Calcium: 9.4 mg/dL (ref 8.9–10.3)
Chloride: 107 mmol/L (ref 98–111)
Creatinine: 1.24 mg/dL — ABNORMAL HIGH (ref 0.44–1.00)
GFR, Estimated: 51 mL/min — ABNORMAL LOW (ref >60)
Glucose, Bld: 131 mg/dL — ABNORMAL HIGH (ref 70–99)
Potassium: 3.7 mmol/L (ref 3.5–5.1)
Sodium: 141 mmol/L (ref 135–145)
Total Bilirubin: 0.3 mg/dL (ref 0.3–1.2)
Total Protein: 7 g/dL (ref 6.5–8.1)

## 2022-03-06 MED ORDER — ONDANSETRON HCL 8 MG PO TABS
8.0000 mg | ORAL_TABLET | Freq: Two times a day (BID) | ORAL | 1 refills | Status: DC | PRN
  Filled 2022-03-06: qty 30, 15d supply, fill #0

## 2022-03-06 MED ORDER — LOSARTAN POTASSIUM 100 MG PO TABS
ORAL_TABLET | ORAL | 3 refills | Status: DC
  Filled 2022-03-06: qty 90, 90d supply, fill #0
  Filled 2022-09-07: qty 90, 90d supply, fill #1

## 2022-03-06 NOTE — Progress Notes (Signed)
[facility] Follow up:    Leslie Bien, MD [place] [address]   DIAGNOSIS:  Cancer Staging  Malignant neoplasm of lower-outer quadrant of right breast of female, estrogen receptor positive ([place]) Staging form: Breast, AJCC 8th Edition - Clinical stage from 01/20/2021: Stage IA (cT1c, cN0, cM0, G2, ER+, PR-, HER2-) - Signed by Chauncey Cruel, MD on 01/25/2021 Stage prefix: Initial diagnosis Histologic grading system: 3 grade system  Malignant neoplasm of overlapping sites of left breast in female, estrogen receptor negative ([place]) Staging form: Breast, AJCC 8th Edition - Clinical stage from 01/20/2021: Stage IIIB (cT3, cN0, cM0, G3, ER-, PR-, HER2-) - Signed by Chauncey Cruel, MD on 01/25/2021 Histologic grading system: 3 grade system   SUMMARY OF ONCOLOGIC HISTORY: Oncology History  Malignant neoplasm of lower-outer quadrant of right breast of female, estrogen receptor positive ([place])  01/20/2021 Cancer Staging   Staging form: Breast, AJCC 8th Edition - Clinical stage from 01/20/2021: Stage IA (cT1c, cN0, cM0, G2, ER+, PR-, HER2-) - Signed by Chauncey Cruel, MD on 01/25/2021 Stage prefix: Initial diagnosis Histologic grading system: 3 grade system    01/23/2021 Initial Diagnosis   Malignant neoplasm of lower-outer quadrant of right breast of female, estrogen receptor positive ([place])    02/10/2021 - 06/17/2021 Chemotherapy   Patient is on Treatment Plan : BREAST Pembrolizumab + Carboplatin D1 + Paclitaxel D1,8,15 q21d X 4 cycles / Pembrolizumab + AC q21d x 4 cycles       Genetic Testing   Negative genetic testing. No pathogenic variants identified on the [facility] Multi-Cancer Panel+RNA. VUS in ATM called c.1132A>G identified. The report date is 02/13/2021.  The Multi-Cancer Panel + RNA offered by [facility] includes sequencing and/or deletion duplication testing of the following 84 genes: AIP, ALK, APC, ATM, AXIN2,BAP1,  BARD1, BLM,  BMPR1A, BRCA1, BRCA2, BRIP1, CASR, CDC73, CDH1, CDK4, CDKN1B, CDKN1C, CDKN2A (p14ARF), CDKN2A (p16INK4a), CEBPA, CHEK2, CTNNA1, DICER1, DIS3L2, EGFR (c.2369C>T, p.Thr790Met variant only), EPCAM (Deletion/duplication testing only), FH, FLCN, GATA2, GPC3, GREM1 (Promoter region deletion/duplication testing only), HOXB13 (c.251G>A, p.Gly84Glu), HRAS, KIT, MAX, MEN1, MET, MITF (c.952G>A, p.Glu318Lys variant only), MLH1, MSH2, MSH3, MSH6, MUTYH, NBN, NF1, NF2, NTHL1, PALB2, PDGFRA, PHOX2B, PMS2, POLD1, POLE, POT1, PRKAR1A, PTCH1, PTEN, RAD50, RAD51C, RAD51D, RB1, RECQL4, RET, RUNX1, SDHAF2, SDHA (sequence changes only), SDHB, SDHC, SDHD, SMAD4, SMARCA4, SMARCB1, SMARCE1, STK11, SUFU, TERC, TERT, TMEM127, TP53, TSC1, TSC2, VHL, WRN and WT1.   10/05/2021 - 11/27/2021 Radiation Therapy   10/05/2021 through 11/27/2021 Site Technique Total Dose (Gy) Dose per Fx (Gy) Completed Fx Beam Energies  Chest Wall, Left: CW_L 3D 50.4/50.4 1.8 28/28 10X  Chest Wall, Left: CW_L_SCLV 3D 50.4/50.4 1.8 28/28 6X, 10X  Chest Wall, Left: CW_L_Bst specialPort 10/10 2 5/5 9E    12/12/2021 -  Chemotherapy   Xeloda 548m, 2 tablets twice a day 14 days on, 7 days off x 6 months   Malignant neoplasm of overlapping sites of left breast in female, estrogen receptor negative ([place]  01/20/2021 Cancer Staging   Staging form: Breast, AJCC 8th Edition - Clinical stage from 01/20/2021: Stage IIIB (cT3, cN0, cM0, G3, ER-, PR-, HER2-) - Signed by MChauncey Cruel MD on 01/25/2021 Histologic grading system: 3 grade system    01/25/2021 Initial Diagnosis   Malignant neoplasm of overlapping sites of left breast in female, estrogen receptor negative ([place]    02/10/2021 - 06/17/2021 Chemotherapy   Patient is on Treatment Plan : BREAST Pembrolizumab + Carboplatin D1 + Paclitaxel  D1,8,15 q21d X 4 cycles / Pembrolizumab + AC q21d x 4 cycles       Genetic Testing   Negative genetic testing. No pathogenic variants identified on the [facility]  Multi-Cancer Panel+RNA. VUS in ATM called c.1132A>G identified. The report date is 02/13/2021.  The Multi-Cancer Panel + RNA offered by [facility] includes sequencing and/or deletion duplication testing of the following 84 genes: AIP, ALK, APC, ATM, AXIN2,BAP1,  BARD1, BLM, BMPR1A, BRCA1, BRCA2, BRIP1, CASR, CDC73, CDH1, CDK4, CDKN1B, CDKN1C, CDKN2A (p14ARF), CDKN2A (p16INK4a), CEBPA, CHEK2, CTNNA1, DICER1, DIS3L2, EGFR (c.2369C>T, p.Thr790Met variant only), EPCAM (Deletion/duplication testing only), FH, FLCN, GATA2, GPC3, GREM1 (Promoter region deletion/duplication testing only), HOXB13 (c.251G>A, p.Gly84Glu), HRAS, KIT, MAX, MEN1, MET, MITF (c.952G>A, p.Glu318Lys variant only), MLH1, MSH2, MSH3, MSH6, MUTYH, NBN, NF1, NF2, NTHL1, PALB2, PDGFRA, PHOX2B, PMS2, POLD1, POLE, POT1, PRKAR1A, PTCH1, PTEN, RAD50, RAD51C, RAD51D, RB1, RECQL4, RET, RUNX1, SDHAF2, SDHA (sequence changes only), SDHB, SDHC, SDHD, SMAD4, SMARCA4, SMARCB1, SMARCE1, STK11, SUFU, TERC, TERT, TMEM127, TP53, TSC1, TSC2, VHL, WRN and WT1.   07/28/2021 - 09/07/2021 Chemotherapy   Patient is on Treatment Plan : HEAD/NECK Pembrolizumab Q21D      10/05/2021 - 11/27/2021 Radiation Therapy   10/05/2021 through 11/27/2021 Site Technique Total Dose (Gy) Dose per Fx (Gy) Completed Fx Beam Energies  Chest Wall, Left: CW_L 3D 50.4/50.4 1.8 28/28 10X  Chest Wall, Left: CW_L_SCLV 3D 50.4/50.4 1.8 28/28 6X, 10X  Chest Wall, Left: CW_L_Bst specialPort 10/10 2 5/5 9E    12/12/2021 -  Chemotherapy   Xeloda 56m, 2 tablets twice a day 14 days on, 7 days off x 6 months     CURRENT THERAPY: Adjuvant capecitabine/Xeloda  INTERVAL HISTORY: Leslie HOWLAND89y.o. female returns for evaluation regarding how she is tolerating capecitabine.  She notes that she has mild nausea the first couple of days however this improves as she continues on the therapy.  She denies any skin changes on the palms of her hands or soles of her feet.  She does have a slightly elevated  blood pressure and notes that she needs a blood pressure refill on her losartan as she is waiting to get in with her primary care provider.  Otherwise she tells me she is tolerating treatment well and has no significant concerns or questions.   Patient Active Problem List   Diagnosis Date Noted   Bilateral breast cancer ([facility] 08/08/2021   CKD (chronic kidney disease) stage 3, GFR 30-59 ml/min (HCC) 04/21/2021   Port-A-Cath in place 03/09/2021   Encounter for antineoplastic chemotherapy 02/17/2021   Encounter for immunotherapy 02/17/2021   Genetic testing 02/14/2021   Hepatic steatosis 02/09/2021   Multilevel degenerative disc disease 02/09/2021   Family history of breast cancer    Family history of pancreatic cancer    Family history of colon cancer    Family history of lung cancer    Family history of prostate cancer    Family history of brain cancer    Malignant neoplasm of overlapping sites of left breast in female, estrogen receptor negative ([facility] 01/25/2021   Malignant neoplasm of lower-outer quadrant of right breast of female, estrogen receptor positive ([facility] 01/23/2021   Status post left hip replacement 05/19/2018   Acute deep vein thrombosis (DVT) of distal end of right lower extremity ([facility] 05/19/2018   Unilateral primary osteoarthritis, left hip 03/25/2018   Status post total replacement of left hip 03/25/2018   Hyperlipidemia 04/11/2017   Class 3 obesity with serious comorbidity and body mass index (  BMI) of 40.0 to 44.9 in adult 04/11/2017   Prediabetes 02/04/2017   Mixed hyperlipidemia 01/09/2017   Insulin resistance 11/27/2016   Essential hypertension 10/25/2016   Morbid obesity with BMI of 40.0-44.9, adult ([place]) 10/25/2016   Vitamin D deficiency 10/25/2016    has No Known Allergies.  MEDICAL HISTORY: Past Medical History:  Diagnosis Date   Acid reflux    Anemia    Arthritis    "maybe in my fingers" (03/26/2018)   Breast cancer ([place]) 12/2020   both sides    Concussion 2001   no deficit had tia right after no problems since   Family history of adverse reaction to anesthesia    "brother, Timmy, and my mom always get sick" (03/26/2018)   Family history of brain cancer    Family history of breast cancer    Family history of colon cancer    Family history of lung cancer    Family history of pancreatic cancer    Family history of prostate cancer    Headache    High blood pressure    High cholesterol    Hip pain    "left; before OR" (03/26/2018)   History of chemotherapy last done 02-17-2021   History of hiatal hernia    History of kidney stones    OSA (obstructive sleep apnea)    "can't tolerate the mask" (03/26/2018)    PONV (postoperative nausea and vomiting)    does not need much anesthesia, slow to awlaekn in past   Pre-diabetes    Shortness of breath    with exertion   TIA (transient ischemic attack)    15 years ago   Wears partial dentures upper    SURGICAL HISTORY: Past Surgical History:  Procedure Laterality Date   CARPAL TUNNEL RELEASE Right ~ 2006   JOINT REPLACEMENT Left    total left hip   LAPAROSCOPIC CHOLECYSTECTOMY  12/2010   LASIK Bilateral 2000   PORT A CATH REVISION Left 02/21/2021   Procedure: PORT A CATH REVISION;  Surgeon: Stark Klein, MD;  Location: [facility];  Service: General;  Laterality: Left;  60 MINUTES ROOM 5   PORT-A-CATH REMOVAL Left 10/11/2021   Procedure: PORT REMOVAL;  Surgeon: Stark Klein, MD;  Location: [facility];  Service: General;  Laterality: Left;   PORTACATH PLACEMENT N/A 02/08/2021   Procedure: INSERTION PORT-A-CATH;  Surgeon: Stark Klein, MD;  Location: [facility];  Service: General;  Laterality: N/A;   RADIOACTIVE SEED GUIDED AXILLARY SENTINEL LYMPH NODE Right 08/08/2021   Procedure: RADIOACTIVE SEED GUIDED RIGHT AXILLARY SENTINEL LYMPH NODE BIOPSY;  Surgeon: Stark Klein, MD;  Location: [facility];  Service: General;  Laterality: Right;   SENTINEL NODE BIOPSY  Bilateral 08/08/2021   Procedure: BILATERAL SENTINEL NODE BIOPSY;  Surgeon: Stark Klein, MD;  Location: [facility];  Service: General;  Laterality: Bilateral;   TOTAL HIP ARTHROPLASTY Left 03/25/2018   Procedure: LEFT TOTAL HIP ARTHROPLASTY ANTERIOR APPROACH;  Surgeon: Mcarthur Rossetti, MD;  Location: [facility];  Service: Orthopedics;  Laterality: Left;   TOTAL MASTECTOMY Bilateral 08/08/2021   Procedure: BILATERAL TOTAL MASTECTOMY;  Surgeon: Stark Klein, MD;  Location: [facility];  Service: General;  Laterality: Bilateral;    SOCIAL HISTORY: Social History   Socioeconomic History   Marital status: Single    Spouse name: Not on file   Number of children: Not on file   Years of education: Not on file   Highest education level: Not on file  Occupational  History   Occupation: Sport and exercise psychologist: [employer]  Tobacco Use   Smoking status: Former    Types: Cigarettes   Smokeless tobacco: Never   Tobacco comments:    In teens social  Vaping Use   Vaping Use: Never used  Substance and Sexual Activity   Alcohol use: Yes    Comment: Occasional   Drug use: Never   Sexual activity: Not Currently    Birth control/protection: Post-menopausal  Other Topics Concern   Not on file  Social History Narrative   Not on file   Social Determinants of Health   Financial Resource Strain: Not on file  Food Insecurity: Not on file  Transportation Needs: Not on file  Physical Activity: Not on file  Stress: Not on file  Social Connections: Not on file  Intimate Partner Violence: Not on file    FAMILY HISTORY: Family History  Problem Relation Age of Onset   Diabetes Mother    Thyroid disease Mother    Liver disease Mother    Obesity Mother    Hypertension Father    Hyperlipidemia Father    Heart disease Father    Stroke Father    Obesity Father    Colon polyps Father    Prostate cancer Brother    Brain cancer Maternal Grandfather    Pancreatic cancer Paternal Grandmother    Colon  cancer Neg Hx        No one in family with colon cancer.    Review of Systems  Constitutional:  Negative for appetite change, chills, fatigue, fever and unexpected weight change.  HENT:   Negative for hearing loss, lump/mass and trouble swallowing.   Eyes:  Negative for eye problems and icterus.  Respiratory:  Negative for chest tightness, cough and shortness of breath.   Cardiovascular:  Negative for chest pain, leg swelling and palpitations.  Gastrointestinal:  Positive for nausea. Negative for abdominal distention, abdominal pain, constipation, diarrhea and vomiting.  Endocrine: Negative for hot flashes.  Genitourinary:  Negative for difficulty urinating.   Musculoskeletal:  Negative for arthralgias.  Skin:  Negative for itching and rash.  Neurological:  Negative for dizziness, extremity weakness, headaches and numbness.  Hematological:  Negative for adenopathy. Does not bruise/bleed easily.  Psychiatric/Behavioral:  Negative for depression. The patient is not nervous/anxious.      PHYSICAL EXAMINATION  ECOG PERFORMANCE STATUS: 1 - Symptomatic but completely ambulatory  Vitals:   03/06/22 1207  BP: (!) 148/87  Pulse: 71  Resp: 16  Temp: 97.7 F (36.5 C)  SpO2: 99%    Physical Exam Constitutional:      General: She is not in acute distress.    Appearance: Normal appearance. She is not toxic-appearing.  HENT:     Head: Normocephalic and atraumatic.  Eyes:     General: No scleral icterus. Cardiovascular:     Rate and Rhythm: Normal rate and regular rhythm.     Pulses: Normal pulses.     Heart sounds: Normal heart sounds.  Pulmonary:     Effort: Pulmonary effort is normal.     Breath sounds: Normal breath sounds.  Abdominal:     General: Abdomen is flat. Bowel sounds are normal. There is no distension.     Palpations: Abdomen is soft.     Tenderness: There is no abdominal tenderness.  Musculoskeletal:        General: No swelling.     Cervical back: Neck supple.   Lymphadenopathy:  Cervical: No cervical adenopathy.  Skin:    General: Skin is warm and dry.     Findings: No rash.  Neurological:     General: No focal deficit present.     Mental Status: She is alert.  Psychiatric:        Mood and Affect: Mood normal.        Behavior: Behavior normal.    LABORATORY DATA:  CBC    Component Value Date/Time   WBC 6.2 03/06/2022 1150   WBC 6.9 12/06/2021 1042   RBC 3.86 (L) 03/06/2022 1150   HGB 12.4 03/06/2022 1150   HGB 13.4 09/27/2016 1342   HCT 37.0 03/06/2022 1150   HCT 40.9 09/27/2016 1342   PLT 149 (L) 03/06/2022 1150   MCV 95.9 03/06/2022 1150   MCV 83 09/27/2016 1342   MCH 32.1 03/06/2022 1150   MCHC 33.5 03/06/2022 1150   RDW 15.9 (H) 03/06/2022 1150   RDW 15.9 (H) 09/27/2016 1342   LYMPHSABS 1.2 03/06/2022 1150   LYMPHSABS 2.3 09/27/2016 1342   MONOABS 0.6 03/06/2022 1150   EOSABS 0.6 (H) 03/06/2022 1150   EOSABS 0.6 (H) 09/27/2016 1342   BASOSABS 0.0 03/06/2022 1150   BASOSABS 0.0 09/27/2016 1342    CMP     Component Value Date/Time   NA 141 03/06/2022 1150   NA 143 12/02/2018 1138   K 3.7 03/06/2022 1150   CL 107 03/06/2022 1150   CO2 26 03/06/2022 1150   GLUCOSE 131 (H) 03/06/2022 1150   BUN 13 03/06/2022 1150   BUN 17 12/02/2018 1138   CREATININE 1.24 (H) 03/06/2022 1150   CREATININE 1.09 (H) 02/18/2019 1524   CALCIUM 9.4 03/06/2022 1150   PROT 7.0 03/06/2022 1150   PROT 6.9 12/02/2018 1138   ALBUMIN 3.9 03/06/2022 1150   ALBUMIN 4.0 12/02/2018 1138   AST 12 (L) 03/06/2022 1150   ALT 10 03/06/2022 1150   ALKPHOS 74 03/06/2022 1150   BILITOT 0.3 03/06/2022 1150   GFRNONAA 51 (L) 03/06/2022 1150   GFRAA 70 12/02/2018 1138      ASSESSMENT and THERAPY PLAN:   Malignant neoplasm of overlapping sites of left breast in female, estrogen receptor negative ([place]) right breast, a clinical T2 N0-1, stage IIA-IIB invasive ductal carcinoma, grade 2, estrogen receptor positive, progesterone receptor and HER2  negative, with an MIB-1 of 5%   left breast, a clinical T3 N0, stage IIIB invasive ductal carcinoma, grade 3, functionally triple negative, with an MIB-1 of 25% (HER2 results are pending).   neoadjuvant chemotherapy started 02/09/2021 consisting of pembrolizumab/ carboplatin/ paclitaxel stopped after 3 cycles due to peripheral neuropathy followed by pembrolizumab/ doxorubicin/ cyclophosphamide x4 started 04/13/2021, completed on 06/15/2021   Adjuvant radiation completed 11/27/2021 Treatment plan:  Adjuvant capecitabine 1000 mg p.o. twice daily 14 days on 7 days off for 6 months beginning March 2023 2. antiestrogens for the right-sided breast cancer: Will start after Xeloda completion in September 2023  Leslie Wright is doing well today.  She continues on Xeloda 2 weeks on and 1 week off with good tolerance.  Her labs today are stable and there are no significant concerns regarding these today.  I refilled her antinausea medication as well as her losartan since we are seeing her so frequently.  Her blood pressure remains stable on the medication and she tolerates this well.  Leslie Wright has no signs of breast cancer recurrence and will continue on the current treatment plan.  She does call for any questions or concerns that  may arise prior to her next visit which will occur in approximately 3 weeks.   All questions were answered. The patient knows to call the clinic with any problems, questions or concerns. We can certainly see the patient much sooner if necessary.  Total encounter time:20 minutes*in face-to-face visit time, chart review, lab review, care coordination, order entry, and documentation of the encounter time.    Wilber Bihari, NP 03/06/22 4:22 PM Medical Oncology and Hematology [facility] [place], [place] [address] Tel. [phone]    Fax. [phone]  *Total Encounter Time as defined by the Centers for Medicare and Medicaid Services includes, in addition to  the face-to-face time of a patient visit (documented in the note above) non-face-to-face time: obtaining and reviewing outside history, ordering and reviewing medications, tests or procedures, care coordination (communications with other health care professionals or caregivers) and documentation in the medical record.

## 2022-03-06 NOTE — Assessment & Plan Note (Signed)
rightbreast, a clinical T2 N0-1, stage IIA-IIB invasive ductal carcinoma, grade 2, estrogen receptor positive, progesterone receptor and HER2 negative, with an MIB-1 of 5%  left breast, a clinical T3 N0, stage IIIBinvasive ductal carcinoma, grade 3, functionally triple negative,with an MIB-1 of 25% (HER2 results are pending).  neoadjuvant chemotherapy started05/08/2021 consisting of pembrolizumab/ carboplatin/ paclitaxel stopped after 3 cycles due to peripheral neuropathy followed by pembrolizumab/ doxorubicin/ cyclophosphamidex4 started 04/13/2021, completedon 06/15/2021  Adjuvant radiation completed 11/27/2021 Treatment plan:  1. Adjuvant capecitabine 1000 mg p.o. twice daily 14 days on 7 days off for 6 months beginning March 2023 2. antiestrogens for the right-sided breast cancer: Will start after Xeloda completion in September 2023  Leslie Wright is doing well today.  She continues on Xeloda 2 weeks on and 1 week off with good tolerance.  Her labs today are stable and there are no significant concerns regarding these today.  I refilled her antinausea medication as well as her losartan since we are seeing her so frequently.  Her blood pressure remains stable on the medication and she tolerates this well.  Leslie Wright has no signs of breast cancer recurrence and will continue on the current treatment plan.  She does call for any questions or concerns that may arise prior to her next visit which will occur in approximately 3 weeks.

## 2022-03-13 ENCOUNTER — Other Ambulatory Visit (HOSPITAL_COMMUNITY): Payer: Self-pay

## 2022-03-22 ENCOUNTER — Other Ambulatory Visit (HOSPITAL_COMMUNITY): Payer: Self-pay

## 2022-03-26 ENCOUNTER — Inpatient Hospital Stay: Payer: 59 | Admitting: Pharmacist

## 2022-03-26 ENCOUNTER — Inpatient Hospital Stay: Payer: 59

## 2022-03-26 VITALS — BP 149/99 | HR 84 | Temp 97.5°F | Resp 18 | Ht 67.0 in | Wt 268.4 lb

## 2022-03-26 DIAGNOSIS — C50511 Malignant neoplasm of lower-outer quadrant of right female breast: Secondary | ICD-10-CM | POA: Diagnosis not present

## 2022-03-26 DIAGNOSIS — Z87891 Personal history of nicotine dependence: Secondary | ICD-10-CM | POA: Diagnosis not present

## 2022-03-26 DIAGNOSIS — Z808 Family history of malignant neoplasm of other organs or systems: Secondary | ICD-10-CM | POA: Diagnosis not present

## 2022-03-26 DIAGNOSIS — Z8042 Family history of malignant neoplasm of prostate: Secondary | ICD-10-CM | POA: Diagnosis not present

## 2022-03-26 DIAGNOSIS — C50811 Malignant neoplasm of overlapping sites of right female breast: Secondary | ICD-10-CM | POA: Diagnosis not present

## 2022-03-26 DIAGNOSIS — Z801 Family history of malignant neoplasm of trachea, bronchus and lung: Secondary | ICD-10-CM | POA: Diagnosis not present

## 2022-03-26 DIAGNOSIS — C50911 Malignant neoplasm of unspecified site of right female breast: Secondary | ICD-10-CM

## 2022-03-26 DIAGNOSIS — Z803 Family history of malignant neoplasm of breast: Secondary | ICD-10-CM | POA: Diagnosis not present

## 2022-03-26 DIAGNOSIS — Z170 Estrogen receptor positive status [ER+]: Secondary | ICD-10-CM | POA: Diagnosis not present

## 2022-03-26 DIAGNOSIS — Z79899 Other long term (current) drug therapy: Secondary | ICD-10-CM | POA: Diagnosis not present

## 2022-03-26 LAB — CMP (CANCER CENTER ONLY)
ALT: 11 U/L (ref 0–44)
AST: 12 U/L — ABNORMAL LOW (ref 15–41)
Albumin: 4 g/dL (ref 3.5–5.0)
Alkaline Phosphatase: 81 U/L (ref 38–126)
Anion gap: 12 (ref 5–15)
BUN: 10 mg/dL (ref 6–20)
CO2: 24 mmol/L (ref 22–32)
Calcium: 9.3 mg/dL (ref 8.9–10.3)
Chloride: 104 mmol/L (ref 98–111)
Creatinine: 1.2 mg/dL — ABNORMAL HIGH (ref 0.44–1.00)
GFR, Estimated: 53 mL/min — ABNORMAL LOW (ref >60)
Glucose, Bld: 133 mg/dL — ABNORMAL HIGH (ref 70–99)
Potassium: 3.8 mmol/L (ref 3.5–5.1)
Sodium: 140 mmol/L (ref 135–145)
Total Bilirubin: 0.3 mg/dL (ref 0.3–1.2)
Total Protein: 7.2 g/dL (ref 6.5–8.1)

## 2022-03-26 LAB — CBC WITH DIFFERENTIAL (CANCER CENTER ONLY)
Abs Immature Granulocytes: 0.08 10*3/uL — ABNORMAL HIGH (ref 0.00–0.07)
Basophils Absolute: 0 10*3/uL (ref 0.0–0.1)
Basophils Relative: 0 %
Eosinophils Absolute: 0.8 10*3/uL — ABNORMAL HIGH (ref 0.0–0.5)
Eosinophils Relative: 10 %
HCT: 37.8 % (ref 36.0–46.0)
Hemoglobin: 13.2 g/dL (ref 12.0–15.0)
Immature Granulocytes: 1 %
Lymphocytes Relative: 18 %
Lymphs Abs: 1.4 10*3/uL (ref 0.7–4.0)
MCH: 32.8 pg (ref 26.0–34.0)
MCHC: 34.9 g/dL (ref 30.0–36.0)
MCV: 94 fL (ref 80.0–100.0)
Monocytes Absolute: 0.6 10*3/uL (ref 0.1–1.0)
Monocytes Relative: 7 %
Neutro Abs: 4.9 10*3/uL (ref 1.7–7.7)
Neutrophils Relative %: 64 %
Platelet Count: 171 10*3/uL (ref 150–400)
RBC: 4.02 MIL/uL (ref 3.87–5.11)
RDW: 15.4 % (ref 11.5–15.5)
WBC Count: 7.8 10*3/uL (ref 4.0–10.5)
nRBC: 0 % (ref 0.0–0.2)

## 2022-03-28 ENCOUNTER — Telehealth: Payer: Self-pay | Admitting: Pharmacist

## 2022-03-28 NOTE — Telephone Encounter (Signed)
Scheduled appointment per 06/26 los. Patient aware.

## 2022-04-05 ENCOUNTER — Other Ambulatory Visit (HOSPITAL_COMMUNITY): Payer: Self-pay

## 2022-04-12 ENCOUNTER — Other Ambulatory Visit (HOSPITAL_COMMUNITY): Payer: Self-pay

## 2022-04-18 ENCOUNTER — Encounter: Payer: Self-pay | Admitting: Hematology and Oncology

## 2022-04-19 ENCOUNTER — Other Ambulatory Visit: Payer: 59

## 2022-04-19 ENCOUNTER — Encounter: Payer: 59 | Admitting: Physician Assistant

## 2022-04-19 NOTE — Progress Notes (Unsigned)
Patient Care Team: Fanny Bien, MD as PCP - General (Family Medicine) Herminio Commons, MD (Inactive) as PCP - Cardiology (Cardiology) Mauro Kaufmann, RN as Oncology Nurse Navigator Rockwell Germany, RN as Oncology Nurse Navigator Stark Klein, MD as Consulting Physician (General Surgery) Kyung Rudd, MD as Consulting Physician (Radiation Oncology) Raina Mina, RPH-CPP as Pharmacist (Hematology and Oncology)  DIAGNOSIS:  Encounter Diagnoses  Name Primary?   Malignant neoplasm of overlapping sites of left breast in female, estrogen receptor negative (Leslie Wright)    Malignant neoplasm of lower-outer quadrant of right breast of female, estrogen receptor positive (Leslie Wright)     SUMMARY OF ONCOLOGIC HISTORY: Oncology History  Malignant neoplasm of lower-outer quadrant of right breast of female, estrogen receptor positive (Leslie Wright)  01/20/2021 Cancer Staging   Staging form: Breast, AJCC 8th Edition - Clinical stage from 01/20/2021: Stage IA (cT1c, cN0, cM0, G2, ER+, PR-, HER2-) - Signed by Chauncey Cruel, MD on 01/25/2021 Stage prefix: Initial diagnosis Histologic grading system: 3 grade system   01/23/2021 Initial Diagnosis   Malignant neoplasm of lower-outer quadrant of right breast of female, estrogen receptor positive (Leslie Wright)   02/10/2021 - 06/17/2021 Chemotherapy   Patient is on Treatment Plan : BREAST Pembrolizumab + Carboplatin D1 + Paclitaxel D1,8,15 q21d X 4 cycles / Pembrolizumab + AC q21d x 4 cycles      Genetic Testing   Negative genetic testing. No pathogenic variants identified on the [facility] Multi-Cancer Panel+RNA. VUS in ATM called c.1132A>G identified. The report date is 02/13/2021.  The Multi-Cancer Panel + RNA offered by [facility] includes sequencing and/or deletion duplication testing of the following 84 genes: AIP, ALK, APC, ATM, AXIN2,BAP1,  BARD1, BLM, BMPR1A, BRCA1, BRCA2, BRIP1, CASR, CDC73, CDH1, CDK4, CDKN1B, CDKN1C, CDKN2A (p14ARF), CDKN2A (p16INK4a), CEBPA,  CHEK2, CTNNA1, DICER1, DIS3L2, EGFR (c.2369C>T, p.Thr790Met variant only), EPCAM (Deletion/duplication testing only), FH, FLCN, GATA2, GPC3, GREM1 (Promoter region deletion/duplication testing only), HOXB13 (c.251G>A, p.Gly84Glu), HRAS, KIT, MAX, MEN1, MET, MITF (c.952G>A, p.Glu318Lys variant only), MLH1, MSH2, MSH3, MSH6, MUTYH, NBN, NF1, NF2, NTHL1, PALB2, PDGFRA, PHOX2B, PMS2, POLD1, POLE, POT1, PRKAR1A, PTCH1, PTEN, RAD50, RAD51C, RAD51D, RB1, RECQL4, RET, RUNX1, SDHAF2, SDHA (sequence changes only), SDHB, SDHC, SDHD, SMAD4, SMARCA4, SMARCB1, SMARCE1, STK11, SUFU, TERC, TERT, TMEM127, TP53, TSC1, TSC2, VHL, WRN and WT1.   10/05/2021 - 11/27/2021 Radiation Therapy   10/05/2021 through 11/27/2021 Site Technique Total Dose (Gy) Dose per Fx (Gy) Completed Fx Beam Energies  Chest Wall, Left: CW_L 3D 50.4/50.4 1.8 28/28 10X  Chest Wall, Left: CW_L_SCLV 3D 50.4/50.4 1.8 28/28 6X, 10X  Chest Wall, Left: CW_L_Bst specialPort 10/10 2 5/5 9E    12/12/2021 -  Chemotherapy   Xeloda 528m, 2 tablets twice a day 14 days on, 7 days off x 6 months   Malignant neoplasm of overlapping sites of left breast in female, estrogen receptor negative (Leslie Wright  01/20/2021 Cancer Staging   Staging form: Breast, AJCC 8th Edition - Clinical stage from 01/20/2021: Stage IIIB (cT3, cN0, cM0, G3, ER-, PR-, HER2-) - Signed by MChauncey Cruel MD on 01/25/2021 Histologic grading system: 3 grade system   01/25/2021 Initial Diagnosis   Malignant neoplasm of overlapping sites of left breast in female, estrogen receptor negative (Leslie Wright   02/10/2021 - 06/17/2021 Chemotherapy   Patient is on Treatment Plan : BREAST Pembrolizumab + Carboplatin D1 + Paclitaxel D1,8,15 q21d X 4 cycles / Pembrolizumab + AC q21d x 4 cycles      Genetic Testing   Negative genetic testing. No pathogenic  variants identified on the [facility] Multi-Cancer Panel+RNA. VUS in ATM called c.1132A>G identified. The report date is 02/13/2021.  The Multi-Cancer Panel + RNA  offered by [facility] includes sequencing and/or deletion duplication testing of the following 84 genes: AIP, ALK, APC, ATM, AXIN2,BAP1,  BARD1, BLM, BMPR1A, BRCA1, BRCA2, BRIP1, CASR, CDC73, CDH1, CDK4, CDKN1B, CDKN1C, CDKN2A (p14ARF), CDKN2A (p16INK4a), CEBPA, CHEK2, CTNNA1, DICER1, DIS3L2, EGFR (c.2369C>T, p.Thr790Met variant only), EPCAM (Deletion/duplication testing only), FH, FLCN, GATA2, GPC3, GREM1 (Promoter region deletion/duplication testing only), HOXB13 (c.251G>A, p.Gly84Glu), HRAS, KIT, MAX, MEN1, MET, MITF (c.952G>A, p.Glu318Lys variant only), MLH1, MSH2, MSH3, MSH6, MUTYH, NBN, NF1, NF2, NTHL1, PALB2, PDGFRA, PHOX2B, PMS2, POLD1, POLE, POT1, PRKAR1A, PTCH1, PTEN, RAD50, RAD51C, RAD51D, RB1, RECQL4, RET, RUNX1, SDHAF2, SDHA (sequence changes only), SDHB, SDHC, SDHD, SMAD4, SMARCA4, SMARCB1, SMARCE1, STK11, SUFU, TERC, TERT, TMEM127, TP53, TSC1, TSC2, VHL, WRN and WT1.   07/28/2021 - 09/07/2021 Chemotherapy   Patient is on Treatment Plan : HEAD/NECK Pembrolizumab Q21D     10/05/2021 - 11/27/2021 Radiation Therapy   10/05/2021 through 11/27/2021 Site Technique Total Dose (Gy) Dose per Fx (Gy) Completed Fx Beam Energies  Chest Wall, Left: CW_L 3D 50.4/50.4 1.8 28/28 10X  Chest Wall, Left: CW_L_SCLV 3D 50.4/50.4 1.8 28/28 6X, 10X  Chest Wall, Left: CW_L_Bst specialPort 10/10 2 5/5 9E    12/12/2021 -  Chemotherapy   Xeloda 527m, 2 tablets twice a day 14 days on, 7 days off x 6 months     CHIEF COMPLIANT: Follow-up of bilateral breast cancers on Xeloda  INTERVAL HISTORY: Leslie HANKSis a 56y.o. with above-mentioned history of bilateral breast cancers undergone bilateral mastectomies. She presents to the clinic today for follow-up. She states that is everything is well. Complains of a knot behind right ear. She noticed it Monday ut she felt the pain Saturday it made her feel like she had the flu. She has been using hot compress and alcohol compress for relief.     ALLERGIES:  has No Known  Allergies.  MEDICATIONS:  Current Outpatient Medications  Medication Sig Dispense Refill   amoxicillin-clavulanate (AUGMENTIN) 875-125 MG tablet Take 1 tablet by mouth 2 (two) times daily. 14 tablet 0   acetaminophen (TYLENOL) 500 MG tablet Take 1,000 mg by mouth every 6 (six) hours as needed for moderate pain, mild pain or headache.     aspirin EC 81 MG tablet Take 81 mg by mouth daily.     betamethasone valerate ointment (VALISONE) 0.1 % Apply 1 application topically 2 (two) times daily. 30 g 0   capecitabine (XELODA) 500 MG tablet Take 2 tablets (1,000 mg total) by mouth 2 (two) times daily after a meal. Take for 14 days, then hold for 7 days. Repeat every 21 days. 56 tablet 7   famotidine (PEPCID) 40 MG tablet Take 20 mg by mouth daily.     loperamide (IMODIUM) 2 MG capsule Take by mouth as needed for diarrhea or loose stools. Take 2 tabs (4 mg) with first loose stool, then 1 tab (2 mg) with each additional loose stool. Do not take more than 8 tabs (16 mg) in a 24-hour period.     losartan (COZAAR) 100 MG tablet Take 1 tablet by mouth daily 90 tablet 3   ondansetron (ZOFRAN) 8 MG tablet Take 1 tablet (8 mg total) by mouth 2 (two) times daily as needed. Start on the third day after carboplatin and AC chemotherapy. 30 tablet 1   prochlorperazine (COMPAZINE) 10 MG tablet Take 1 tablet (10  mg total) by mouth every 6 (six) hours as needed (Nausea or vomiting). 30 tablet 1   No current facility-administered medications for this visit.    PHYSICAL EXAMINATION: ECOG PERFORMANCE STATUS: 1 - Symptomatic but completely ambulatory  Vitals:   04/20/22 0955  BP: (!) 141/103  Pulse: 84  Resp: 16  Temp: (!) 97.5 F (36.4 C)  SpO2: 95%   Filed Weights   04/20/22 0955  Weight: 263 lb 4.8 oz (119.4 kg)      LABORATORY DATA:  I have reviewed the data as listed    Latest Ref Rng & Units 04/20/2022    9:09 AM 03/26/2022   12:14 PM 03/06/2022   11:50 AM  CMP  Glucose 70 - 99 mg/dL 190  133  131    BUN 6 - 20 mg/dL _0 Creatinine 0.44 - 1.00 mg/dL 1.35  1.20  1.24   Sodium 135 - 145 mmol/L 133  140  141   Potassium 3.5 - 5.1 mmol/L 3.7  3.8  3.7   Chloride 98 - 111 mmol/L 97  104  107   CO2 22 - 32 mmol/L _1 Calcium 8.9 - 10.3 mg/dL 9.5  9.3  9.4   Total Protein 6.5 - 8.1 g/dL 7.9  7.2  7.0   Total Bilirubin 0.3 - 1.2 mg/dL 0.7  0.3  0.3   Alkaline Phos 38 - 126 U/L 89  81  74   AST 15 - 41 U/L _2 ALT 0 - 44 U/L _3 Lab Results  Component Value Date   WBC 12.9 (H) 04/20/2022   HGB 12.9 04/20/2022   HCT 38.0 04/20/2022   MCV 93.4 04/20/2022   PLT 197 04/20/2022   NEUTROABS 10.8 (H) 04/20/2022    ASSESSMENT & PLAN:  Malignant neoplasm of overlapping sites of left breast in female, estrogen receptor negative (Leslie Wright) right breast, a clinical T2 N0-1, stage IIA-IIB invasive ductal carcinoma, grade 2, estrogen receptor positive, progesterone receptor and HER2 negative, with an MIB-1 of 5%   left breast, a clinical T3 N0, stage IIIB invasive ductal carcinoma, grade 3, functionally triple negative, with an MIB-1 of 25% (HER2 results are pending).   neoadjuvant chemotherapy started 02/09/2021 consisting of pembrolizumab/ carboplatin/ paclitaxel stopped after 3 cycles due to peripheral neuropathy followed by pembrolizumab/ doxorubicin/ cyclophosphamide x4 started 04/13/2021, completed on 06/15/2021   Adjuvant radiation completed 11/27/2021  Treatment plan:  Adjuvant capecitabine 1000 mg p.o. twice daily 14 days on 7 days off for 6 months beginning March 2023 2. antiestrogens for the right-sided breast cancer: Will start after Xeloda completion in September 2023 ------------------------------------------------------------- Capecitabine toxicities: Tolerating it extremely well without any major side effects.  Abscess behind the right ear: She will need incision and drainage.  We are trying to figure out a way to send her for this  procedure. Diabetes mellitus: Today's blood sugar was 190.  I discussed with her about strict carb control.  Return to clinic in September for follow-up    No orders of the defined types were placed in this encounter.  The patient has a good understanding of the overall plan. she agrees with it. she will call with any problems that may develop before the next visit here. Total time spent: 30 mins including face to face time and time spent for planning, charting and co-ordination of care  Harriette Ohara, MD 04/20/22    I Gardiner Coins am scribing for Dr. Lindi Adie  I have reviewed the above documentation for accuracy and completeness, and I agree with the above.

## 2022-04-20 ENCOUNTER — Other Ambulatory Visit: Payer: Self-pay

## 2022-04-20 ENCOUNTER — Inpatient Hospital Stay: Payer: 59 | Attending: Oncology

## 2022-04-20 ENCOUNTER — Inpatient Hospital Stay (HOSPITAL_BASED_OUTPATIENT_CLINIC_OR_DEPARTMENT_OTHER): Payer: 59 | Admitting: Hematology and Oncology

## 2022-04-20 ENCOUNTER — Other Ambulatory Visit (HOSPITAL_COMMUNITY): Payer: Self-pay

## 2022-04-20 DIAGNOSIS — Z171 Estrogen receptor negative status [ER-]: Secondary | ICD-10-CM | POA: Diagnosis not present

## 2022-04-20 DIAGNOSIS — Z170 Estrogen receptor positive status [ER+]: Secondary | ICD-10-CM | POA: Insufficient documentation

## 2022-04-20 DIAGNOSIS — C50511 Malignant neoplasm of lower-outer quadrant of right female breast: Secondary | ICD-10-CM

## 2022-04-20 DIAGNOSIS — C50812 Malignant neoplasm of overlapping sites of left female breast: Secondary | ICD-10-CM

## 2022-04-20 DIAGNOSIS — L02811 Cutaneous abscess of head [any part, except face]: Secondary | ICD-10-CM | POA: Diagnosis not present

## 2022-04-20 DIAGNOSIS — C50811 Malignant neoplasm of overlapping sites of right female breast: Secondary | ICD-10-CM | POA: Diagnosis not present

## 2022-04-20 DIAGNOSIS — Z79811 Long term (current) use of aromatase inhibitors: Secondary | ICD-10-CM | POA: Diagnosis not present

## 2022-04-20 LAB — CBC WITH DIFFERENTIAL (CANCER CENTER ONLY)
Abs Immature Granulocytes: 0.06 10*3/uL (ref 0.00–0.07)
Basophils Absolute: 0 10*3/uL (ref 0.0–0.1)
Basophils Relative: 0 %
Eosinophils Absolute: 0.2 10*3/uL (ref 0.0–0.5)
Eosinophils Relative: 2 %
HCT: 38 % (ref 36.0–46.0)
Hemoglobin: 12.9 g/dL (ref 12.0–15.0)
Immature Granulocytes: 1 %
Lymphocytes Relative: 7 %
Lymphs Abs: 0.9 10*3/uL (ref 0.7–4.0)
MCH: 31.7 pg (ref 26.0–34.0)
MCHC: 33.9 g/dL (ref 30.0–36.0)
MCV: 93.4 fL (ref 80.0–100.0)
Monocytes Absolute: 0.9 10*3/uL (ref 0.1–1.0)
Monocytes Relative: 7 %
Neutro Abs: 10.8 10*3/uL — ABNORMAL HIGH (ref 1.7–7.7)
Neutrophils Relative %: 83 %
Platelet Count: 197 10*3/uL (ref 150–400)
RBC: 4.07 MIL/uL (ref 3.87–5.11)
RDW: 14 % (ref 11.5–15.5)
WBC Count: 12.9 10*3/uL — ABNORMAL HIGH (ref 4.0–10.5)
nRBC: 0 % (ref 0.0–0.2)

## 2022-04-20 LAB — CMP (CANCER CENTER ONLY)
ALT: 16 U/L (ref 0–44)
AST: 20 U/L (ref 15–41)
Albumin: 4 g/dL (ref 3.5–5.0)
Alkaline Phosphatase: 89 U/L (ref 38–126)
Anion gap: 14 (ref 5–15)
BUN: 13 mg/dL (ref 6–20)
CO2: 22 mmol/L (ref 22–32)
Calcium: 9.5 mg/dL (ref 8.9–10.3)
Chloride: 97 mmol/L — ABNORMAL LOW (ref 98–111)
Creatinine: 1.35 mg/dL — ABNORMAL HIGH (ref 0.44–1.00)
GFR, Estimated: 46 mL/min — ABNORMAL LOW (ref >60)
Glucose, Bld: 190 mg/dL — ABNORMAL HIGH (ref 70–99)
Potassium: 3.7 mmol/L (ref 3.5–5.1)
Sodium: 133 mmol/L — ABNORMAL LOW (ref 135–145)
Total Bilirubin: 0.7 mg/dL (ref 0.3–1.2)
Total Protein: 7.9 g/dL (ref 6.5–8.1)

## 2022-04-20 MED ORDER — PROCHLORPERAZINE MALEATE 10 MG PO TABS
10.0000 mg | ORAL_TABLET | Freq: Four times a day (QID) | ORAL | 1 refills | Status: DC | PRN
  Filled 2022-04-20: qty 30, 8d supply, fill #0

## 2022-04-20 MED ORDER — DOXYCYCLINE MONOHYDRATE 100 MG PO CAPS
100.0000 mg | ORAL_CAPSULE | Freq: Two times a day (BID) | ORAL | 0 refills | Status: DC
  Filled 2022-04-20: qty 14, 7d supply, fill #0

## 2022-04-20 MED ORDER — AMOXICILLIN-POT CLAVULANATE 875-125 MG PO TABS
1.0000 | ORAL_TABLET | Freq: Two times a day (BID) | ORAL | 0 refills | Status: DC
  Filled 2022-04-20: qty 14, 7d supply, fill #0

## 2022-04-20 NOTE — Assessment & Plan Note (Addendum)
rightbreast, a clinical T2 N0-1, stage IIA-IIB invasive ductal carcinoma, grade 2, estrogen receptor positive, progesterone receptor and HER2 negative, with an MIB-1 of 5%  left breast, a clinical T3 N0, stage IIIBinvasive ductal carcinoma, grade 3, functionally triple negative,with an MIB-1 of 25% (HER2 results are pending).  neoadjuvant chemotherapy started05/08/2021 consisting of pembrolizumab/ carboplatin/ paclitaxel stopped after 3 cycles due to peripheral neuropathy followed by pembrolizumab/ doxorubicin/ cyclophosphamidex4 started 04/13/2021, completedon 06/15/2021  Adjuvant radiation completed 11/27/2021  Treatment plan: 1. Adjuvant capecitabine 1000 mg p.o. twice daily 14 days on 7 days off for 6 months beginning March 2023 2.antiestrogens for the right-sided breast cancer: Will start after Xeloda completion in September 2023 ------------------------------------------------------------- Capecitabine toxicities: Tolerating it extremely well without any major side effects.  Abscess behind the right ear: She will need incision and drainage.  We are trying to figure out a way to send her for this procedure.  Return to clinic in September for follow-up

## 2022-04-23 ENCOUNTER — Encounter: Payer: Self-pay | Admitting: Hematology and Oncology

## 2022-04-27 ENCOUNTER — Other Ambulatory Visit (HOSPITAL_COMMUNITY): Payer: Self-pay

## 2022-04-27 DIAGNOSIS — R7303 Prediabetes: Secondary | ICD-10-CM | POA: Diagnosis not present

## 2022-05-02 ENCOUNTER — Other Ambulatory Visit (HOSPITAL_COMMUNITY): Payer: Self-pay

## 2022-05-03 DIAGNOSIS — I10 Essential (primary) hypertension: Secondary | ICD-10-CM | POA: Diagnosis not present

## 2022-05-03 DIAGNOSIS — R7303 Prediabetes: Secondary | ICD-10-CM | POA: Diagnosis not present

## 2022-05-03 DIAGNOSIS — C50919 Malignant neoplasm of unspecified site of unspecified female breast: Secondary | ICD-10-CM | POA: Diagnosis not present

## 2022-05-03 DIAGNOSIS — N189 Chronic kidney disease, unspecified: Secondary | ICD-10-CM | POA: Diagnosis not present

## 2022-05-03 DIAGNOSIS — E1121 Type 2 diabetes mellitus with diabetic nephropathy: Secondary | ICD-10-CM | POA: Diagnosis not present

## 2022-05-04 ENCOUNTER — Other Ambulatory Visit (HOSPITAL_COMMUNITY): Payer: Self-pay

## 2022-05-09 ENCOUNTER — Inpatient Hospital Stay: Payer: 59

## 2022-05-09 ENCOUNTER — Other Ambulatory Visit: Payer: Self-pay

## 2022-05-09 ENCOUNTER — Inpatient Hospital Stay: Payer: 59 | Attending: Oncology | Admitting: Pharmacist

## 2022-05-09 VITALS — BP 108/68 | HR 93 | Temp 97.9°F | Resp 16 | Ht 67.0 in | Wt 270.9 lb

## 2022-05-09 DIAGNOSIS — C50511 Malignant neoplasm of lower-outer quadrant of right female breast: Secondary | ICD-10-CM

## 2022-05-09 DIAGNOSIS — C50812 Malignant neoplasm of overlapping sites of left female breast: Secondary | ICD-10-CM | POA: Insufficient documentation

## 2022-05-09 DIAGNOSIS — Z79899 Other long term (current) drug therapy: Secondary | ICD-10-CM | POA: Insufficient documentation

## 2022-05-09 DIAGNOSIS — R197 Diarrhea, unspecified: Secondary | ICD-10-CM | POA: Insufficient documentation

## 2022-05-09 DIAGNOSIS — Z171 Estrogen receptor negative status [ER-]: Secondary | ICD-10-CM | POA: Diagnosis not present

## 2022-05-09 DIAGNOSIS — Z170 Estrogen receptor positive status [ER+]: Secondary | ICD-10-CM | POA: Insufficient documentation

## 2022-05-09 DIAGNOSIS — R110 Nausea: Secondary | ICD-10-CM | POA: Insufficient documentation

## 2022-05-09 LAB — CBC WITH DIFFERENTIAL (CANCER CENTER ONLY)
Abs Immature Granulocytes: 0.04 10*3/uL (ref 0.00–0.07)
Basophils Absolute: 0 10*3/uL (ref 0.0–0.1)
Basophils Relative: 0 %
Eosinophils Absolute: 0.3 10*3/uL (ref 0.0–0.5)
Eosinophils Relative: 4 %
HCT: 36.6 % (ref 36.0–46.0)
Hemoglobin: 12.5 g/dL (ref 12.0–15.0)
Immature Granulocytes: 1 %
Lymphocytes Relative: 19 %
Lymphs Abs: 1.3 10*3/uL (ref 0.7–4.0)
MCH: 31.6 pg (ref 26.0–34.0)
MCHC: 34.2 g/dL (ref 30.0–36.0)
MCV: 92.4 fL (ref 80.0–100.0)
Monocytes Absolute: 0.6 10*3/uL (ref 0.1–1.0)
Monocytes Relative: 9 %
Neutro Abs: 4.5 10*3/uL (ref 1.7–7.7)
Neutrophils Relative %: 67 %
Platelet Count: 184 10*3/uL (ref 150–400)
RBC: 3.96 MIL/uL (ref 3.87–5.11)
RDW: 14.6 % (ref 11.5–15.5)
WBC Count: 6.7 10*3/uL (ref 4.0–10.5)
nRBC: 0 % (ref 0.0–0.2)

## 2022-05-09 LAB — CMP (CANCER CENTER ONLY)
ALT: 10 U/L (ref 0–44)
AST: 11 U/L — ABNORMAL LOW (ref 15–41)
Albumin: 3.9 g/dL (ref 3.5–5.0)
Alkaline Phosphatase: 72 U/L (ref 38–126)
Anion gap: 7 (ref 5–15)
BUN: 19 mg/dL (ref 6–20)
CO2: 24 mmol/L (ref 22–32)
Calcium: 8.9 mg/dL (ref 8.9–10.3)
Chloride: 106 mmol/L (ref 98–111)
Creatinine: 1.36 mg/dL — ABNORMAL HIGH (ref 0.44–1.00)
GFR, Estimated: 45 mL/min — ABNORMAL LOW (ref >60)
Glucose, Bld: 111 mg/dL — ABNORMAL HIGH (ref 70–99)
Potassium: 3.9 mmol/L (ref 3.5–5.1)
Sodium: 137 mmol/L (ref 135–145)
Total Bilirubin: 0.3 mg/dL (ref 0.3–1.2)
Total Protein: 7.6 g/dL (ref 6.5–8.1)

## 2022-05-09 NOTE — Progress Notes (Signed)
Leslie Wright       Telephone: [phone]?Fax: [phone]   Oncology Clinical Pharmacist Practitioner Progress Note  [place] Leslie Wright was contacted via in-person visit to discuss her chemotherapy regimen for capecitabine which they receive under the care of Dr. Nicholas Lose.    Current treatment regimen and start date Capecitabine (12/12/21)   Interval History She continues on capecitabine 2 tablets (1000 mg) in the morning and 2 tablets (1000 mg) in the evening on days 1 to 14 of a 21-day cycle. This is being given  monotherapy. Therapy is planned to continue until  6 months in the adjuvant setting (September 2023). She will then likely start antiestrogens for 7 years per Dr. Lindi Adie. She is postmenopausal.  Response to Therapy Leslie Wright is seen today by clinical pharmacy as a follow-up to her capecitabine management.  She last saw Dr. Lindi Adie on 04/20/22 and clinical pharmacy on 03/26/22.  When she last saw Dr. Lindi Adie, he had noticed an abscess behind her right ear and he had referred her to [facility] surgery who then did incision and drainage on 04/20/22 and prescribed doxycycline at that point.  Today she says she still has a little bit of a bump behind her ear but it continues to get better.  She is no longer on doxycycline.  She was told to hold capecitabine for approximately 2 weeks by Dr. Lindi Adie and she restarted this on 05/01/22.  She currently has a follow-up appointment with labs with Dr. Lindi Adie on 05/31/22 but since she is almost done with her 6 months of adjuvant capecitabine he will see her again in 5 weeks instead.  This will be around to date 06/13/22.  At that point she will likely start antiestrogens for approximately 7 years per Dr. Geralyn Flash recommendations.  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  She continues to have  cyclical nausea which she has been taking prochlorperazine for daily in the morning.  She says this has helped tremendously and she only has breakthrough nausea on occasion.  We did discuss that if she would like she could take ondansetron when she has breakthrough nausea if she is not doing that again for her prochlorperazine.  She verbalized understanding of the plan.  She also continues to take loperamide as needed for diarrhea.  She states that she takes 2 to 4 mg every other day.  Her serum creatinine continues to be slightly elevated although it is close to her baseline value.  We did again explain the importance of drinking plenty of fluids.  She has not describing any other side effects related to capecitabine at this point.  She does state that she has missed a couple doses in the morning of capecitabine.  Since her last visit she has restarted losartan and today her blood pressure levels are good.  She last saw Dr. Sarina Ill recently and they did not make any adjustments to her losartan.  Leslie Wright says that they did recommend her to restart vitamin D at 5000 international units daily and a One-A-Day multivitamin.  She follows up with them again in September.  Labs, vitals, treatment parameters, and manufacturer guidelines assessing toxicity were reviewed with Leslie Wright today. Based on these values, patient is in agreement to continue therapy at this time.  Allergies No Known Allergies  Vitals  05/09/2022    1:04 PM 04/20/2022    9:55 AM 03/26/2022   12:55 PM  Vitals with BMI  Height '5\' 7"'$   '5\' 7"'$   Weight 270 lbs 14 oz 263 lbs 5 oz 268 lbs 6 oz  BMI 42.42 66.06 30.16  Systolic 010 932 355  Diastolic 68 732 99  Pulse 93 84 84     Laboratory Data    Latest Ref Rng & Units 05/09/2022   12:51 PM 04/20/2022    9:09 AM 03/26/2022   12:14 PM  CBC EXTENDED  WBC 4.0 - 10.5 K/uL 6.7  12.9  7.8   RBC 3.87 - 5.11 MIL/uL 3.96  4.07  4.02   Hemoglobin 12.0 - 15.0 g/dL 12.5  12.9  13.2   HCT  36.0 - 46.0 % 36.6  38.0  37.8   Platelets 150 - 400 K/uL 184  197  171   NEUT# 1.7 - 7.7 K/uL 4.5  10.8  4.9   Lymph# 0.7 - 4.0 K/uL 1.3  0.9  1.4        Latest Ref Rng & Units 05/09/2022   12:51 PM 04/20/2022    9:09 AM 03/26/2022   12:14 PM  CMP  Glucose 70 - 99 mg/dL 111  190  133   BUN 6 - 20 mg/dL '19  13  10   '$ Creatinine 0.44 - 1.00 mg/dL 1.36  1.35  1.20   Sodium 135 - 145 mmol/L 137  133  140   Potassium 3.5 - 5.1 mmol/L 3.9  3.7  3.8   Chloride 98 - 111 mmol/L 106  97  104   CO2 22 - 32 mmol/L '24  22  24   '$ Calcium 8.9 - 10.3 mg/dL 8.9  9.5  9.3   Total Protein 6.5 - 8.1 g/dL 7.6  7.9  7.2   Total Bilirubin 0.3 - 1.2 mg/dL 0.3  0.7  0.3   Alkaline Phos 38 - 126 U/L 72  89  81   AST 15 - 41 U/L '11  20  12   '$ ALT 0 - 44 U/L '10  16  11     '$ Lab Results  Component Value Date   MG 1.6 (L) 04/25/2021   MG 1.8 04/23/2021     Adverse Effects Assessment Diarrhea: Controlled with loperamide as above. Nausea: Started taking prochlorperazine every morning due to cyclical nausea which she states has helped a lot.  We did discuss that if she has any breakthrough nausea she could consider taking ondansetron which she does have on hand.  Adherence Assessment Leslie Wright reports missing 2 doses over the past 4 weeks.  As above, she did miss approximately 2 weeks of capecitabine while she was on doxycycline for her abscess. Reason for missed dose: Leslie Wright forgot to take her morning doses of capecitabine and then was off capecitabine for 2 weeks while she was on doxycycline as above. Patient was re-educated on importance of adherence.   Access Assessment Leslie Wright is currently receiving her capecitabine through [facility] concerns: None  Medication Reconciliation The patient's medication list was reviewed today with the patient?  Yes New medications or herbal supplements have recently been started?  No, she plans to start vitamin D 5000  international units by mouth daily and a One-A-Day vitamin soon per Dr. Radonna Ricker instructions. Any medications have been discontinued?  Yes, removed doxycycline from her medication list as she has completed this course.  Also removed  Augmentin which was never started as surgery prefer doxycycline for her abscess. The medication list was updated and reconciled based on the patient's most recent medication list in the electronic medical record (EMR) including herbal products and OTC medications.   Medications Current Outpatient Medications  Medication Sig Dispense Refill   acetaminophen (TYLENOL) 500 MG tablet Take 1,000 mg by mouth every 6 (six) hours as needed for moderate pain, mild pain or headache.     aspirin EC 81 MG tablet Take 81 mg by mouth daily.     capecitabine (XELODA) 500 MG tablet Take 2 tablets (1,000 mg total) by mouth 2 (two) times daily after a meal. Take for 14 days, then hold for 7 days. Repeat every 21 days. 56 tablet 7   famotidine (PEPCID) 40 MG tablet Take 20 mg by mouth daily.     loperamide (IMODIUM) 2 MG capsule Take by mouth as needed for diarrhea or loose stools. Take 2 tabs (4 mg) with first loose stool, then 1 tab (2 mg) with each additional loose stool. Do not take more than 8 tabs (16 mg) in a 24-hour period.     losartan (COZAAR) 100 MG tablet Take 1 tablet by mouth daily 90 tablet 3   ondansetron (ZOFRAN) 8 MG tablet Take 1 tablet (8 mg total) by mouth 2 (two) times daily as needed. Start on the third day after carboplatin and AC chemotherapy. 30 tablet 1   prochlorperazine (COMPAZINE) 10 MG tablet Take 1 tablet (10 mg total) by mouth every 6 (six) hours as needed (Nausea or vomiting). 30 tablet 1   No current facility-administered medications for this visit.    Drug-Drug Interactions (DDIs) DDIs were evaluated? Yes Significant DDIs?  No The patient was instructed to speak with their health care provider and/or the oral chemotherapy pharmacist before starting  any new drug, including prescription or over the counter, natural / herbal products, or vitamins.  Supportive Care Discussed potentially taking ondansetron for breakthrough nausea since she has been taking prochlorperazine every morning. 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 Lindi Adie.  Since she is postmenopausal, he will likely start her on aromatase inhibitors once she is finished with capecitabine. Confirmed Leslie Wright has plenty of capecitabine on hand to finish her 44-monthtreatment plan  Dosing Assessment Hepatic adjustments needed?  No Renal adjustments needed?  No Toxicity adjustments needed?  No The current dosing regimen is appropriate to continue at this time.  Follow-Up Plan Continue capecitabine 1000 mg every 12 hours 14 days on followed by a 7-day rest period. Follow-up with Dr. GLindi Adiewith labs in 5 weeks approximately on 06/13/22.  At this point she will have completed her 6 months of capecitabine in the adjuvant setting.  Will move Dr. GGeralyn Flashcurrent appointment on 05/31/22. Continue to follow with Dr. DErnie Hewfor hypertension management Dr. GLindi Adiewill likely start antiestrogen therapy at some point once capecitabine has been completed.  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 She can continue to see clinical pharmacy on an as-needed basis if she would like.  Leslie Ridgelparticipated in the discussion, expressed understanding, and voiced agreement with the above plan. All questions were answered to her satisfaction. The patient was advised to contact the clinic at [phone] with any questions or concerns prior to her return visit.   I spent 30 minutes assessing and educating the patient.  JRaina Mina RPH-CPP,  05/09/2022  1:40 PM   **Disclaimer: This note was dictated with voice recognition software. Similar sounding words can inadvertently be transcribed and this note may contain  transcription errors which may not have been corrected upon publication of note.**

## 2022-05-10 ENCOUNTER — Other Ambulatory Visit (HOSPITAL_COMMUNITY): Payer: Self-pay

## 2022-05-15 ENCOUNTER — Other Ambulatory Visit (HOSPITAL_COMMUNITY): Payer: Self-pay

## 2022-05-16 ENCOUNTER — Encounter: Payer: Self-pay | Admitting: Physical Therapy

## 2022-05-17 ENCOUNTER — Other Ambulatory Visit (HOSPITAL_COMMUNITY): Payer: Self-pay

## 2022-05-21 ENCOUNTER — Ambulatory Visit: Payer: Self-pay | Admitting: Rehabilitation

## 2022-05-30 ENCOUNTER — Other Ambulatory Visit (HOSPITAL_COMMUNITY): Payer: Self-pay

## 2022-05-31 ENCOUNTER — Inpatient Hospital Stay: Payer: 59 | Admitting: Hematology and Oncology

## 2022-05-31 ENCOUNTER — Inpatient Hospital Stay: Payer: 59

## 2022-05-31 ENCOUNTER — Other Ambulatory Visit (HOSPITAL_COMMUNITY): Payer: Self-pay

## 2022-06-10 NOTE — Progress Notes (Signed)
Patient Care Team: Fanny Bien, MD as PCP - General (Family Medicine) Herminio Commons, MD (Inactive) as PCP - Cardiology (Cardiology) Mauro Kaufmann, RN as Oncology Nurse Navigator Rockwell Germany, RN as Oncology Nurse Navigator Stark Klein, MD as Consulting Physician (General Surgery) Kyung Rudd, MD as Consulting Physician (Radiation Oncology) Raina Mina, RPH-CPP as Pharmacist (Hematology and Oncology)  DIAGNOSIS: No diagnosis found.  SUMMARY OF ONCOLOGIC HISTORY: Oncology History  Malignant neoplasm of lower-outer quadrant of right breast of female, estrogen receptor positive (Leslie Wright)  01/20/2021 Cancer Staging   Staging form: Breast, AJCC 8th Edition - Clinical stage from 01/20/2021: Stage IA (cT1c, cN0, cM0, G2, ER+, PR-, HER2-) - Signed by Chauncey Cruel, MD on 01/25/2021 Stage prefix: Initial diagnosis Histologic grading system: 3 grade system   01/23/2021 Initial Diagnosis   Malignant neoplasm of lower-outer quadrant of right breast of female, estrogen receptor positive (Leslie Wright)   02/10/2021 - 06/17/2021 Chemotherapy   Patient is on Treatment Plan : BREAST Pembrolizumab + Carboplatin D1 + Paclitaxel D1,8,15 q21d X 4 cycles / Pembrolizumab + AC q21d x 4 cycles      Genetic Testing   Negative genetic testing. No pathogenic variants identified on the [facility] Multi-Cancer Panel+RNA. VUS in ATM called c.1132A>G identified. The report date is 02/13/2021.  The Multi-Cancer Panel + RNA offered by [facility] includes sequencing and/or deletion duplication testing of the following 84 genes: AIP, ALK, APC, ATM, AXIN2,BAP1,  BARD1, BLM, BMPR1A, BRCA1, BRCA2, BRIP1, CASR, CDC73, CDH1, CDK4, CDKN1B, CDKN1C, CDKN2A (p14ARF), CDKN2A (p16INK4a), CEBPA, CHEK2, CTNNA1, DICER1, DIS3L2, EGFR (c.2369C>T, p.Thr790Met variant only), EPCAM (Deletion/duplication testing only), FH, FLCN, GATA2, GPC3, GREM1 (Promoter region deletion/duplication testing only), HOXB13 (c.251G>A, p.Gly84Glu),  HRAS, KIT, MAX, MEN1, MET, MITF (c.952G>A, p.Glu318Lys variant only), MLH1, MSH2, MSH3, MSH6, MUTYH, NBN, NF1, NF2, NTHL1, PALB2, PDGFRA, PHOX2B, PMS2, POLD1, POLE, POT1, PRKAR1A, PTCH1, PTEN, RAD50, RAD51C, RAD51D, RB1, RECQL4, RET, RUNX1, SDHAF2, SDHA (sequence changes only), SDHB, SDHC, SDHD, SMAD4, SMARCA4, SMARCB1, SMARCE1, STK11, SUFU, TERC, TERT, TMEM127, TP53, TSC1, TSC2, VHL, WRN and WT1.   10/05/2021 - 11/27/2021 Radiation Therapy   10/05/2021 through 11/27/2021 Site Technique Total Dose (Gy) Dose per Fx (Gy) Completed Fx Beam Energies  Chest Wall, Left: CW_L 3D 50.4/50.4 1.8 28/28 10X  Chest Wall, Left: CW_L_SCLV 3D 50.4/50.4 1.8 28/28 6X, 10X  Chest Wall, Left: CW_L_Bst specialPort 10/10 2 5/5 9E    12/12/2021 -  Chemotherapy   Xeloda 551m, 2 tablets twice a day 14 days on, 7 days off x 6 months   Malignant neoplasm of overlapping sites of left breast in female, estrogen receptor negative (Leslie Wright  01/20/2021 Cancer Staging   Staging form: Breast, AJCC 8th Edition - Clinical stage from 01/20/2021: Stage IIIB (cT3, cN0, cM0, G3, ER-, PR-, HER2-) - Signed by MChauncey Cruel MD on 01/25/2021 Histologic grading system: 3 grade system   01/25/2021 Initial Diagnosis   Malignant neoplasm of overlapping sites of left breast in female, estrogen receptor negative (Leslie Wright   02/10/2021 - 06/17/2021 Chemotherapy   Patient is on Treatment Plan : BREAST Pembrolizumab + Carboplatin D1 + Paclitaxel D1,8,15 q21d X 4 cycles / Pembrolizumab + AC q21d x 4 cycles      Genetic Testing   Negative genetic testing. No pathogenic variants identified on the [facility] Multi-Cancer Panel+RNA. VUS in ATM called c.1132A>G identified. The report date is 02/13/2021.  The Multi-Cancer Panel + RNA offered by [facility] includes sequencing and/or deletion duplication testing of the following 84 genes: AIP,  ALK, APC, ATM, AXIN2,BAP1,  BARD1, BLM, BMPR1A, BRCA1, BRCA2, BRIP1, CASR, CDC73, CDH1, CDK4, CDKN1B, CDKN1C, CDKN2A (p14ARF),  CDKN2A (p16INK4a), CEBPA, CHEK2, CTNNA1, DICER1, DIS3L2, EGFR (c.2369C>T, p.Thr790Met variant only), EPCAM (Deletion/duplication testing only), FH, FLCN, GATA2, GPC3, GREM1 (Promoter region deletion/duplication testing only), HOXB13 (c.251G>A, p.Gly84Glu), HRAS, KIT, MAX, MEN1, MET, MITF (c.952G>A, p.Glu318Lys variant only), MLH1, MSH2, MSH3, MSH6, MUTYH, NBN, NF1, NF2, NTHL1, PALB2, PDGFRA, PHOX2B, PMS2, POLD1, POLE, POT1, PRKAR1A, PTCH1, PTEN, RAD50, RAD51C, RAD51D, RB1, RECQL4, RET, RUNX1, SDHAF2, SDHA (sequence changes only), SDHB, SDHC, SDHD, SMAD4, SMARCA4, SMARCB1, SMARCE1, STK11, SUFU, TERC, TERT, TMEM127, TP53, TSC1, TSC2, VHL, WRN and WT1.   07/28/2021 - 09/07/2021 Chemotherapy   Patient is on Treatment Plan : HEAD/NECK Pembrolizumab Q21D     10/05/2021 - 11/27/2021 Radiation Therapy   10/05/2021 through 11/27/2021 Site Technique Total Dose (Gy) Dose per Fx (Gy) Completed Fx Beam Energies  Chest Wall, Left: CW_L 3D 50.4/50.4 1.8 28/28 10X  Chest Wall, Left: CW_L_SCLV 3D 50.4/50.4 1.8 28/28 6X, 10X  Chest Wall, Left: CW_L_Bst specialPort 10/10 2 5/5 9E    12/12/2021 -  Chemotherapy   Xeloda 515m, 2 tablets twice a day 14 days on, 7 days off x 6 months     CHIEF COMPLIANT: Follow-up of bilateral breast cancers on Xeloda    INTERVAL HISTORY: Leslie RUMMELLis a 56y.o. with above-mentioned history of bilateral breast cancers undergone bilateral mastectomies. 56 presents to the clinic today for follow-up.    ALLERGIES:  has No Known Allergies.  MEDICATIONS:  Current Outpatient Medications  Medication Sig Dispense Refill   acetaminophen (TYLENOL) 500 MG tablet Take 1,000 mg by mouth every 6 (six) hours as needed for moderate pain, mild pain or headache.     aspirin EC 81 MG tablet Take 81 mg by mouth daily.     capecitabine (XELODA) 500 MG tablet Take 2 tablets (1,000 mg total) by mouth 2 (two) times daily after a meal. Take for 14 days, then hold for 7 days. Repeat every 21 days. 56  tablet 7   famotidine (PEPCID) 40 MG tablet Take 20 mg by mouth daily.     loperamide (IMODIUM) 2 MG capsule Take by mouth as needed for diarrhea or loose stools. Take 2 tabs (4 mg) with first loose stool, then 1 tab (2 mg) with each additional loose stool. Do not take more than 8 tabs (16 mg) in a 24-hour period.     losartan (COZAAR) 100 MG tablet Take 1 tablet by mouth daily 90 tablet 3   ondansetron (ZOFRAN) 8 MG tablet Take 1 tablet (8 mg total) by mouth 2 (two) times daily as needed. Start on the third day after carboplatin and AC chemotherapy. 30 tablet 1   prochlorperazine (COMPAZINE) 10 MG tablet Take 1 tablet (10 mg total) by mouth every 6 (six) hours as needed (Nausea or vomiting). 30 tablet 1   No current facility-administered medications for this visit.    PHYSICAL EXAMINATION: ECOG PERFORMANCE STATUS: {CHL ONC ECOG [phone]}  There were no vitals filed for this visit. There were no vitals filed for this visit.  BREAST:*** No palpable masses or nodules in either right or left breasts. No palpable axillary supraclavicular or infraclavicular adenopathy no breast tenderness or nipple discharge. (exam performed in the presence of a chaperone)  LABORATORY DATA:  I have reviewed the data as listed    Latest Ref Rng & Units 05/09/2022   12:51 PM 04/20/2022    9:09 AM 03/26/2022  12:14 PM  CMP  Glucose 70 - 99 mg/dL 111  190  133   BUN 6 - 20 mg/dL 19  13  10    Creatinine 0.44 - 1.00 mg/dL 1.36  1.35  1.20   Sodium 135 - 145 mmol/L 137  133  140   Potassium 3.5 - 5.1 mmol/L 3.9  3.7  3.8   Chloride 98 - 111 mmol/L 106  97  104   CO2 22 - 32 mmol/L 24  22  24    Calcium 8.9 - 10.3 mg/dL 8.9  9.5  9.3   Total Protein 6.5 - 8.1 g/dL 7.6  7.9  7.2   Total Bilirubin 0.3 - 1.2 mg/dL 0.3  0.7  0.3   Alkaline Phos 38 - 126 U/L 72  89  81   AST 15 - 41 U/L 11  20  12    ALT 0 - 44 U/L 10  16  11      Lab Results  Component Value Date   WBC 6.7 05/09/2022   HGB 12.5 05/09/2022    HCT 36.6 05/09/2022   MCV 92.4 05/09/2022   PLT 184 05/09/2022   NEUTROABS 4.5 05/09/2022    ASSESSMENT & PLAN:  No problem-specific Assessment & Plan notes found for this encounter.    No orders of the defined types were placed in this encounter.  The patient has a good understanding of the overall plan. she agrees with it. she will call with any problems that may develop before the next visit here. Total time spent: 30 mins including face to face time and time spent for planning, charting and co-ordination of care   Suzzette Righter, [place] 06/10/22    I Gardiner Coins am scribing for Dr. Lindi Adie  ***

## 2022-06-13 ENCOUNTER — Other Ambulatory Visit (HOSPITAL_COMMUNITY): Payer: Self-pay

## 2022-06-13 ENCOUNTER — Other Ambulatory Visit: Payer: Self-pay

## 2022-06-13 ENCOUNTER — Inpatient Hospital Stay: Payer: 59 | Attending: Oncology

## 2022-06-13 ENCOUNTER — Ambulatory Visit (HOSPITAL_COMMUNITY): Payer: 59 | Attending: Hematology and Oncology

## 2022-06-13 ENCOUNTER — Inpatient Hospital Stay: Payer: 59 | Admitting: Hematology and Oncology

## 2022-06-13 DIAGNOSIS — Z170 Estrogen receptor positive status [ER+]: Secondary | ICD-10-CM | POA: Diagnosis not present

## 2022-06-13 DIAGNOSIS — C50511 Malignant neoplasm of lower-outer quadrant of right female breast: Secondary | ICD-10-CM | POA: Insufficient documentation

## 2022-06-13 DIAGNOSIS — C50812 Malignant neoplasm of overlapping sites of left female breast: Secondary | ICD-10-CM | POA: Diagnosis not present

## 2022-06-13 DIAGNOSIS — Z171 Estrogen receptor negative status [ER-]: Secondary | ICD-10-CM | POA: Insufficient documentation

## 2022-06-13 DIAGNOSIS — R197 Diarrhea, unspecified: Secondary | ICD-10-CM | POA: Diagnosis not present

## 2022-06-13 DIAGNOSIS — R5383 Other fatigue: Secondary | ICD-10-CM | POA: Insufficient documentation

## 2022-06-13 DIAGNOSIS — Z79811 Long term (current) use of aromatase inhibitors: Secondary | ICD-10-CM | POA: Diagnosis not present

## 2022-06-13 LAB — CBC WITH DIFFERENTIAL (CANCER CENTER ONLY)
Abs Immature Granulocytes: 0.03 10*3/uL (ref 0.00–0.07)
Basophils Absolute: 0 10*3/uL (ref 0.0–0.1)
Basophils Relative: 0 %
Eosinophils Absolute: 0.6 10*3/uL — ABNORMAL HIGH (ref 0.0–0.5)
Eosinophils Relative: 8 %
HCT: 38.2 % (ref 36.0–46.0)
Hemoglobin: 13.1 g/dL (ref 12.0–15.0)
Immature Granulocytes: 0 %
Lymphocytes Relative: 17 %
Lymphs Abs: 1.3 10*3/uL (ref 0.7–4.0)
MCH: 31.7 pg (ref 26.0–34.0)
MCHC: 34.3 g/dL (ref 30.0–36.0)
MCV: 92.5 fL (ref 80.0–100.0)
Monocytes Absolute: 0.5 10*3/uL (ref 0.1–1.0)
Monocytes Relative: 6 %
Neutro Abs: 5.2 10*3/uL (ref 1.7–7.7)
Neutrophils Relative %: 69 %
Platelet Count: 147 10*3/uL — ABNORMAL LOW (ref 150–400)
RBC: 4.13 MIL/uL (ref 3.87–5.11)
RDW: 15.2 % (ref 11.5–15.5)
WBC Count: 7.7 10*3/uL (ref 4.0–10.5)
nRBC: 0 % (ref 0.0–0.2)

## 2022-06-13 LAB — CMP (CANCER CENTER ONLY)
ALT: 12 U/L (ref 0–44)
AST: 15 U/L (ref 15–41)
Albumin: 3.6 g/dL (ref 3.5–5.0)
Alkaline Phosphatase: 68 U/L (ref 38–126)
Anion gap: 10 (ref 5–15)
BUN: 16 mg/dL (ref 6–20)
CO2: 24 mmol/L (ref 22–32)
Calcium: 9.1 mg/dL (ref 8.9–10.3)
Chloride: 105 mmol/L (ref 98–111)
Creatinine: 1.28 mg/dL — ABNORMAL HIGH (ref 0.44–1.00)
GFR, Estimated: 49 mL/min — ABNORMAL LOW (ref >60)
Glucose, Bld: 142 mg/dL — ABNORMAL HIGH (ref 70–99)
Potassium: 3.9 mmol/L (ref 3.5–5.1)
Sodium: 139 mmol/L (ref 135–145)
Total Bilirubin: 0.2 mg/dL — ABNORMAL LOW (ref 0.3–1.2)
Total Protein: 7.3 g/dL (ref 6.5–8.1)

## 2022-06-13 MED ORDER — ANASTROZOLE 1 MG PO TABS
1.0000 mg | ORAL_TABLET | Freq: Every day | ORAL | 3 refills | Status: DC
  Filled 2022-06-13: qty 90, 90d supply, fill #0
  Filled 2022-09-07: qty 90, 90d supply, fill #1

## 2022-06-13 NOTE — Assessment & Plan Note (Addendum)
rightbreast, a clinical T2 N0-1, stage IIA-IIB invasive ductal carcinoma, grade 2, estrogen receptor positive, progesterone receptor and HER2 negative, with an MIB-1 of 5%  left breast, a clinical T3 N0, stage IIIBinvasive ductal carcinoma, grade 3, functionally triple negative,with an MIB-1 of 25% (HER2 results are pending).  neoadjuvant chemotherapy started05/08/2021 consisting of pembrolizumab/ carboplatin/ paclitaxel stopped after 3 cycles due to peripheral neuropathy followed by pembrolizumab/ doxorubicin/ cyclophosphamidex4 started 04/13/2021, completedon 06/15/2021  Adjuvant radiation completed 11/27/2021  Treatment plan: 1. Adjuvant capecitabine 1000 mg p.o. twice daily 14 days on 7 days off for 6 monthsbeginning March 2023 2.antiestrogens for the right-sided breast cancer: Will start after Xelodacompletion in September 2023 ------------------------------------------------------------- Capecitabine toxicities: Tolerating it extremely well without any major side effects. This concludes Xeloda treatment. Treatment plan: Antiestrogen therapy with anastrozole 1 mg daily x5 to 7 years  Anastrozole counseling: We discussed the risks and benefits of anti-estrogen therapy with aromatase inhibitors. These include but not limited to insomnia, hot flashes, mood changes, vaginal dryness, bone density loss, and weight gain. We strongly believe that the benefits far outweigh the risks. Patient understands these risks and consented to starting treatment. Planned treatment duration is 7 years.  Abscess behind the right ear: Resolved after incision and drainage  Abdominal pain and back pain with a history of breast cancer: I will obtain a CT chest abdomen and pelvis without contrast (because of her chronic kidney disease)  Telephone visit after that to discuss results Survivorship care plan visit in 3 months

## 2022-06-14 ENCOUNTER — Telehealth: Payer: Self-pay | Admitting: Hematology and Oncology

## 2022-06-14 NOTE — Telephone Encounter (Signed)
Contacted patient to scheduled appointments. Patient is aware of appointments that are scheduled.   

## 2022-06-22 DIAGNOSIS — Z0000 Encounter for general adult medical examination without abnormal findings: Secondary | ICD-10-CM | POA: Diagnosis not present

## 2022-06-22 DIAGNOSIS — E559 Vitamin D deficiency, unspecified: Secondary | ICD-10-CM | POA: Diagnosis not present

## 2022-06-22 DIAGNOSIS — Z114 Encounter for screening for human immunodeficiency virus [HIV]: Secondary | ICD-10-CM | POA: Diagnosis not present

## 2022-06-22 DIAGNOSIS — R7303 Prediabetes: Secondary | ICD-10-CM | POA: Diagnosis not present

## 2022-06-26 DIAGNOSIS — E782 Mixed hyperlipidemia: Secondary | ICD-10-CM | POA: Diagnosis not present

## 2022-06-26 DIAGNOSIS — Z0000 Encounter for general adult medical examination without abnormal findings: Secondary | ICD-10-CM | POA: Diagnosis not present

## 2022-06-26 DIAGNOSIS — R7989 Other specified abnormal findings of blood chemistry: Secondary | ICD-10-CM | POA: Diagnosis not present

## 2022-06-26 DIAGNOSIS — Z23 Encounter for immunization: Secondary | ICD-10-CM | POA: Diagnosis not present

## 2022-06-26 DIAGNOSIS — I10 Essential (primary) hypertension: Secondary | ICD-10-CM | POA: Diagnosis not present

## 2022-07-12 ENCOUNTER — Telehealth: Payer: Self-pay | Admitting: *Deleted

## 2022-07-12 NOTE — Telephone Encounter (Signed)
Received VM from pt requesting to pick up oral contrast.  RN attempt x1 to return call.  No answer.  Unable to LVM due to VM being full.

## 2022-07-13 ENCOUNTER — Ambulatory Visit (HOSPITAL_COMMUNITY)
Admission: RE | Admit: 2022-07-13 | Discharge: 2022-07-13 | Disposition: A | Discharge status: Home / Self Care | Payer: 59 | Source: Ambulatory Visit | Attending: Hematology and Oncology | Admitting: Hematology and Oncology

## 2022-07-13 DIAGNOSIS — K760 Fatty (change of) liver, not elsewhere classified: Secondary | ICD-10-CM | POA: Diagnosis not present

## 2022-07-13 DIAGNOSIS — Z171 Estrogen receptor negative status [ER-]: Secondary | ICD-10-CM | POA: Diagnosis not present

## 2022-07-13 DIAGNOSIS — R590 Localized enlarged lymph nodes: Secondary | ICD-10-CM | POA: Diagnosis not present

## 2022-07-13 DIAGNOSIS — C50919 Malignant neoplasm of unspecified site of unspecified female breast: Secondary | ICD-10-CM | POA: Diagnosis not present

## 2022-07-13 DIAGNOSIS — C50812 Malignant neoplasm of overlapping sites of left female breast: Secondary | ICD-10-CM | POA: Diagnosis not present

## 2022-07-13 DIAGNOSIS — I700 Atherosclerosis of aorta: Secondary | ICD-10-CM | POA: Diagnosis not present

## 2022-07-13 DIAGNOSIS — K5730 Diverticulosis of large intestine without perforation or abscess without bleeding: Secondary | ICD-10-CM | POA: Diagnosis not present

## 2022-07-13 DIAGNOSIS — R918 Other nonspecific abnormal finding of lung field: Secondary | ICD-10-CM | POA: Diagnosis not present

## 2022-07-13 DIAGNOSIS — M47814 Spondylosis without myelopathy or radiculopathy, thoracic region: Secondary | ICD-10-CM | POA: Diagnosis not present

## 2022-07-13 NOTE — Progress Notes (Signed)
HEMATOLOGY-ONCOLOGY TELEPHONE VISIT PROGRESS NOTE  I connected with our patient on 07/16/22 at  2:15 PM EDT by telephone and verified that I am speaking with the correct person using two identifiers.  I discussed the limitations, risks, security and privacy concerns of performing an evaluation and management service by telephone and the availability of in person appointments.  I also discussed with the patient that there may be a patient responsible charge related to this service. The patient expressed understanding and agreed to proceed.   History of Present Illness: Leslie Wright is a 56 y.o. with above-mentioned history of bilateral breast cancers undergone bilateral mastectomies currently on herceptin and Xeloda. She presents to the clinic today via phone.  Oncology History  Malignant neoplasm of lower-outer quadrant of right breast of female, estrogen receptor positive ([place])  01/20/2021 Cancer Staging   Staging form: Breast, AJCC 8th Edition - Clinical stage from 01/20/2021: Stage IA (cT1c, cN0, cM0, G2, ER+, PR-, HER2-) - Signed by Chauncey Cruel, MD on 01/25/2021 Stage prefix: Initial diagnosis Histologic grading system: 3 grade system   01/23/2021 Initial Diagnosis   Malignant neoplasm of lower-outer quadrant of right breast of female, estrogen receptor positive ([place])   02/10/2021 - 06/17/2021 Chemotherapy   Patient is on Treatment Plan : BREAST Pembrolizumab + Carboplatin D1 + Paclitaxel D1,8,15 q21d X 4 cycles / Pembrolizumab + AC q21d x 4 cycles      Genetic Testing   Negative genetic testing. No pathogenic variants identified on the [facility] Multi-Cancer Panel+RNA. VUS in ATM called c.1132A>G identified. The report date is 02/13/2021.  The Multi-Cancer Panel + RNA offered by [facility] includes sequencing and/or deletion duplication testing of the following 84 genes: AIP, ALK, APC, ATM, AXIN2,BAP1,  BARD1, BLM, BMPR1A, BRCA1, BRCA2, BRIP1, CASR, CDC73, CDH1, CDK4, CDKN1B, CDKN1C,  CDKN2A (p14ARF), CDKN2A (p16INK4a), CEBPA, CHEK2, CTNNA1, DICER1, DIS3L2, EGFR (c.2369C>T, p.Thr790Met variant only), EPCAM (Deletion/duplication testing only), FH, FLCN, GATA2, GPC3, GREM1 (Promoter region deletion/duplication testing only), HOXB13 (c.251G>A, p.Gly84Glu), HRAS, KIT, MAX, MEN1, MET, MITF (c.952G>A, p.Glu318Lys variant only), MLH1, MSH2, MSH3, MSH6, MUTYH, NBN, NF1, NF2, NTHL1, PALB2, PDGFRA, PHOX2B, PMS2, POLD1, POLE, POT1, PRKAR1A, PTCH1, PTEN, RAD50, RAD51C, RAD51D, RB1, RECQL4, RET, RUNX1, SDHAF2, SDHA (sequence changes only), SDHB, SDHC, SDHD, SMAD4, SMARCA4, SMARCB1, SMARCE1, STK11, SUFU, TERC, TERT, TMEM127, TP53, TSC1, TSC2, VHL, WRN and WT1.   10/05/2021 - 11/27/2021 Radiation Therapy   10/05/2021 through 11/27/2021 Site Technique Total Dose (Gy) Dose per Fx (Gy) Completed Fx Beam Energies  Chest Wall, Left: CW_L 3D 50.4/50.4 1.8 28/28 10X  Chest Wall, Left: CW_L_SCLV 3D 50.4/50.4 1.8 28/28 6X, 10X  Chest Wall, Left: CW_L_Bst specialPort 10/10 2 5/5 9E    12/12/2021 -  Chemotherapy   Xeloda 536m, 2 tablets twice a day 14 days on, 7 days off x 6 months   Malignant neoplasm of overlapping sites of left breast in female, estrogen receptor negative ([place]  01/20/2021 Cancer Staging   Staging form: Breast, AJCC 8th Edition - Clinical stage from 01/20/2021: Stage IIIB (cT3, cN0, cM0, G3, ER-, PR-, HER2-) - Signed by MChauncey Cruel MD on 01/25/2021 Histologic grading system: 3 grade system   01/25/2021 Initial Diagnosis   Malignant neoplasm of overlapping sites of left breast in female, estrogen receptor negative ([place]   02/10/2021 - 06/17/2021 Chemotherapy   Patient is on Treatment Plan : BREAST Pembrolizumab + Carboplatin D1 + Paclitaxel D1,8,15 q21d X 4 cycles / Pembrolizumab + AC q21d x 4 cycles  Genetic Testing   Negative genetic testing. No pathogenic variants identified on the [facility] Multi-Cancer Panel+RNA. VUS in ATM called c.1132A>G identified. The report date is  02/13/2021.  The Multi-Cancer Panel + RNA offered by [facility] includes sequencing and/or deletion duplication testing of the following 84 genes: AIP, ALK, APC, ATM, AXIN2,BAP1,  BARD1, BLM, BMPR1A, BRCA1, BRCA2, BRIP1, CASR, CDC73, CDH1, CDK4, CDKN1B, CDKN1C, CDKN2A (p14ARF), CDKN2A (p16INK4a), CEBPA, CHEK2, CTNNA1, DICER1, DIS3L2, EGFR (c.2369C>T, p.Thr790Met variant only), EPCAM (Deletion/duplication testing only), FH, FLCN, GATA2, GPC3, GREM1 (Promoter region deletion/duplication testing only), HOXB13 (c.251G>A, p.Gly84Glu), HRAS, KIT, MAX, MEN1, MET, MITF (c.952G>A, p.Glu318Lys variant only), MLH1, MSH2, MSH3, MSH6, MUTYH, NBN, NF1, NF2, NTHL1, PALB2, PDGFRA, PHOX2B, PMS2, POLD1, POLE, POT1, PRKAR1A, PTCH1, PTEN, RAD50, RAD51C, RAD51D, RB1, RECQL4, RET, RUNX1, SDHAF2, SDHA (sequence changes only), SDHB, SDHC, SDHD, SMAD4, SMARCA4, SMARCB1, SMARCE1, STK11, SUFU, TERC, TERT, TMEM127, TP53, TSC1, TSC2, VHL, WRN and WT1.   07/28/2021 - 09/07/2021 Chemotherapy   Patient is on Treatment Plan : HEAD/NECK Pembrolizumab Q21D     10/05/2021 - 11/27/2021 Radiation Therapy   10/05/2021 through 11/27/2021 Site Technique Total Dose (Gy) Dose per Fx (Gy) Completed Fx Beam Energies  Chest Wall, Left: CW_L 3D 50.4/50.4 1.8 28/28 10X  Chest Wall, Left: CW_L_SCLV 3D 50.4/50.4 1.8 28/28 6X, 10X  Chest Wall, Left: CW_L_Bst specialPort 10/10 2 5/5 9E    12/12/2021 -  Chemotherapy   Xeloda 560m, 2 tablets twice a day 14 days on, 7 days off x 6 months     REVIEW OF SYSTEMS:   Constitutional: Denies fevers, chills or abnormal weight loss All other systems were reviewed with the patient and are negative. Observations/Objective:     Assessment Plan:  Malignant neoplasm of overlapping sites of left breast in female, estrogen receptor negative ([place] right breast, a clinical T2 N0-1, stage IIA-IIB invasive ductal carcinoma, grade 2, estrogen receptor positive, progesterone receptor and HER2 negative, with an MIB-1 of 5%    left breast, a clinical T3 N0, stage IIIB invasive ductal carcinoma, grade 3, functionally triple negative, with an MIB-1 of 25% (HER2 results are pending).   neoadjuvant chemotherapy started 02/09/2021 consisting of pembrolizumab/ carboplatin/ paclitaxel stopped after 3 cycles due to peripheral neuropathy followed by pembrolizumab/ doxorubicin/ cyclophosphamide x4 started 04/13/2021, completed on 06/15/2021  Bilateral Mastectomies: 08/08/2021: Bilateral mastectomies. Right mastectomy: Focal residual IDC 01 6 cm, 0/2 lymph nodes negative, ER 90%, PR 0%, HER2 negative, Ki-67 5% Left mastectomy: Residual multifocal IDC 7 cm with DCIS margins -0/5 lymph nodes negative, ER weak 5%, PR 0%, HER2 negative, Ki-67 25%   Adjuvant radiation completed 11/27/2021   Treatment plan:  Adjuvant capecitabine 1000 mg p.o. twice daily 14 days on 7 days off for 6 months beginning March 2023 completed September 2023 2. antiestrogens for the right-sided breast cancer: Start September 2023 ------------------------------------------------------------- Treatment plan: Antiestrogen therapy with anastrozole 1 mg daily x5 to 7 years started 06/13/2022   Anastrozole toxicities: No side effects.  Breast cancer surveillance: 1.  CT CAP 07/15/2022: No metastatic disease.  Tiny left upper lung nodules from radiation.  Hepatic steatosis. 2. annual mammograms will be scheduled. She has an appointment to see LMendel Ryderin a couple of months. Return to clinic in 6 months for follow-up and after that we can see her once a year.    I discussed the assessment and treatment plan with the patient. The patient was provided an opportunity to ask questions and all were answered. The patient agreed with the plan and demonstrated an  understanding of the instructions. The patient was advised to call back or seek an in-person evaluation if the symptoms worsen or if the condition fails to improve as anticipated.   I provided 12 minutes of  non-face-to-face time during this encounter.  This includes time for charting and coordination of care   Harriette Ohara, MD  I Gardiner Coins am scribing for Dr. Lindi Adie  I have reviewed the above documentation for accuracy and completeness, and I agree with the above.

## 2022-07-16 ENCOUNTER — Inpatient Hospital Stay: Payer: 59 | Attending: Oncology | Admitting: Hematology and Oncology

## 2022-07-16 DIAGNOSIS — Z171 Estrogen receptor negative status [ER-]: Secondary | ICD-10-CM

## 2022-07-16 DIAGNOSIS — C50812 Malignant neoplasm of overlapping sites of left female breast: Secondary | ICD-10-CM | POA: Diagnosis not present

## 2022-07-16 NOTE — Assessment & Plan Note (Signed)
rightbreast, a clinical T2 N0-1, stage IIA-IIB invasive ductal carcinoma, grade 2, estrogen receptor positive, progesterone receptor and HER2 negative, with an MIB-1 of 5%  left breast, a clinical T3 N0, stage IIIBinvasive ductal carcinoma, grade 3, functionally triple negative,with an MIB-1 of 25% (HER2 results are pending).  neoadjuvant chemotherapy started05/08/2021 consisting of pembrolizumab/ carboplatin/ paclitaxel stopped after 3 cycles due to peripheral neuropathy followed by pembrolizumab/ doxorubicin/ cyclophosphamidex4 started 04/13/2021, completedon 06/15/2021  Adjuvant radiation completed 11/27/2021  Treatment plan: 1. Adjuvant capecitabine 1000 mg p.o. twice daily 14 days on 7 days off for 6 monthsbeginning March 2023 completed September 2023 2.antiestrogens for the right-sided breast cancer: Start September 2023 ------------------------------------------------------------- Treatment plan: Antiestrogen therapy with anastrozole 1 mg daily x5 to 7 years started 06/13/2022  Anastrozole toxicities:  Breast cancer surveillance: 1.  CT CAP 07/15/2022: No metastatic disease.  Tiny left upper lung nodules from radiation.  Hepatic steatosis. 2. annual mammograms will be scheduled.  Return to clinic in 6 months for follow-up and after that we can see her once a year. 

## 2022-07-17 ENCOUNTER — Telehealth: Payer: Self-pay | Admitting: Hematology and Oncology

## 2022-07-17 NOTE — Telephone Encounter (Signed)
Scheduled appointment per 10/16 los. Left voicemail.

## 2022-07-24 ENCOUNTER — Other Ambulatory Visit (HOSPITAL_COMMUNITY): Payer: Self-pay

## 2022-07-24 DIAGNOSIS — C50812 Malignant neoplasm of overlapping sites of left female breast: Secondary | ICD-10-CM | POA: Diagnosis not present

## 2022-07-24 DIAGNOSIS — E559 Vitamin D deficiency, unspecified: Secondary | ICD-10-CM | POA: Diagnosis not present

## 2022-07-24 DIAGNOSIS — E1165 Type 2 diabetes mellitus with hyperglycemia: Secondary | ICD-10-CM | POA: Diagnosis not present

## 2022-07-24 DIAGNOSIS — R7303 Prediabetes: Secondary | ICD-10-CM | POA: Diagnosis not present

## 2022-07-24 DIAGNOSIS — I10 Essential (primary) hypertension: Secondary | ICD-10-CM | POA: Diagnosis not present

## 2022-07-24 DIAGNOSIS — Z7185 Encounter for immunization safety counseling: Secondary | ICD-10-CM | POA: Diagnosis not present

## 2022-07-24 MED ORDER — LOSARTAN POTASSIUM 100 MG PO TABS
100.0000 mg | ORAL_TABLET | Freq: Every day | ORAL | 3 refills | Status: DC
  Filled 2022-07-24: qty 90, 90d supply, fill #0
  Filled 2022-11-27: qty 90, 90d supply, fill #1

## 2022-07-24 MED ORDER — ROSUVASTATIN CALCIUM 5 MG PO TABS
5.0000 mg | ORAL_TABLET | Freq: Every evening | ORAL | 3 refills | Status: DC
  Filled 2022-07-24: qty 90, 90d supply, fill #0
  Filled 2022-11-27: qty 90, 90d supply, fill #1

## 2022-07-25 ENCOUNTER — Other Ambulatory Visit (HOSPITAL_COMMUNITY): Payer: Self-pay

## 2022-07-30 ENCOUNTER — Telehealth: Payer: Self-pay | Admitting: *Deleted

## 2022-07-30 NOTE — Telephone Encounter (Signed)
Received call from pt with hx of abscess behind right ear stating the abscess is starting to return.  Per MD pt needing to f/u with surgery since she underwent an I&D of abscess in July as well as f/u with PCP for further evaluation and tx.  Pt verbalized understanding.

## 2022-09-01 IMAGING — MG MM BREAST LOCALIZATION CLIP
4 series · 4 of 12 positions shown · non-contrast
Comparison: Previous exam(s).

CLINICAL DATA: 55-year-old female status post stereotactic biopsy
of the right breast.

EXAM:
DIAGNOSTIC RIGHT MAMMOGRAM POST STEREOTACTIC BIOPSY

[R CC synth-2D]
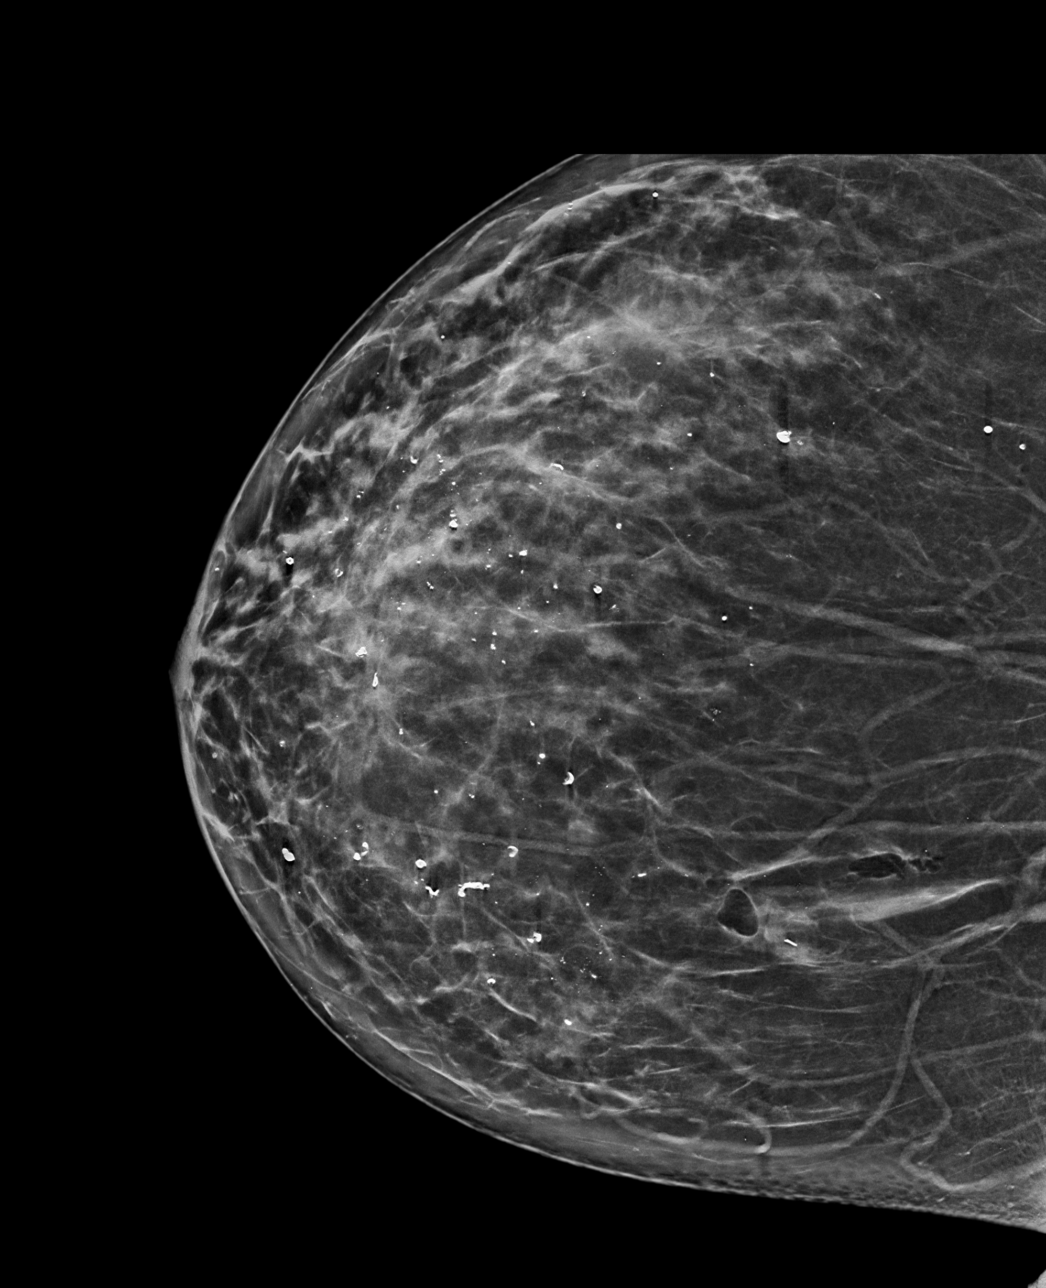

[R ML synth-2D]
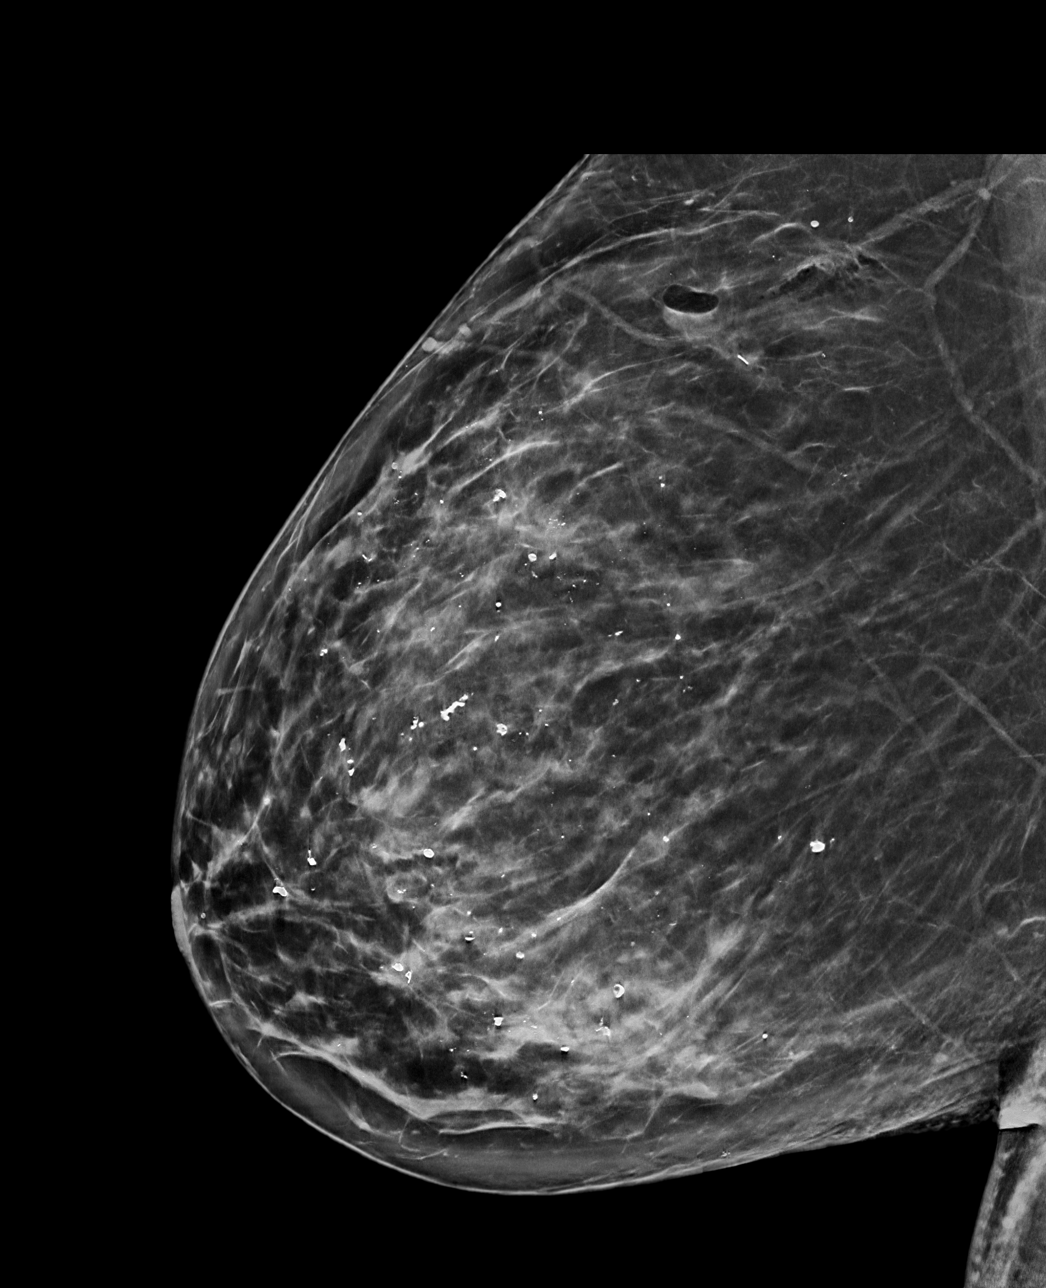

[R CC tomo · tomo slice 43/86.0]
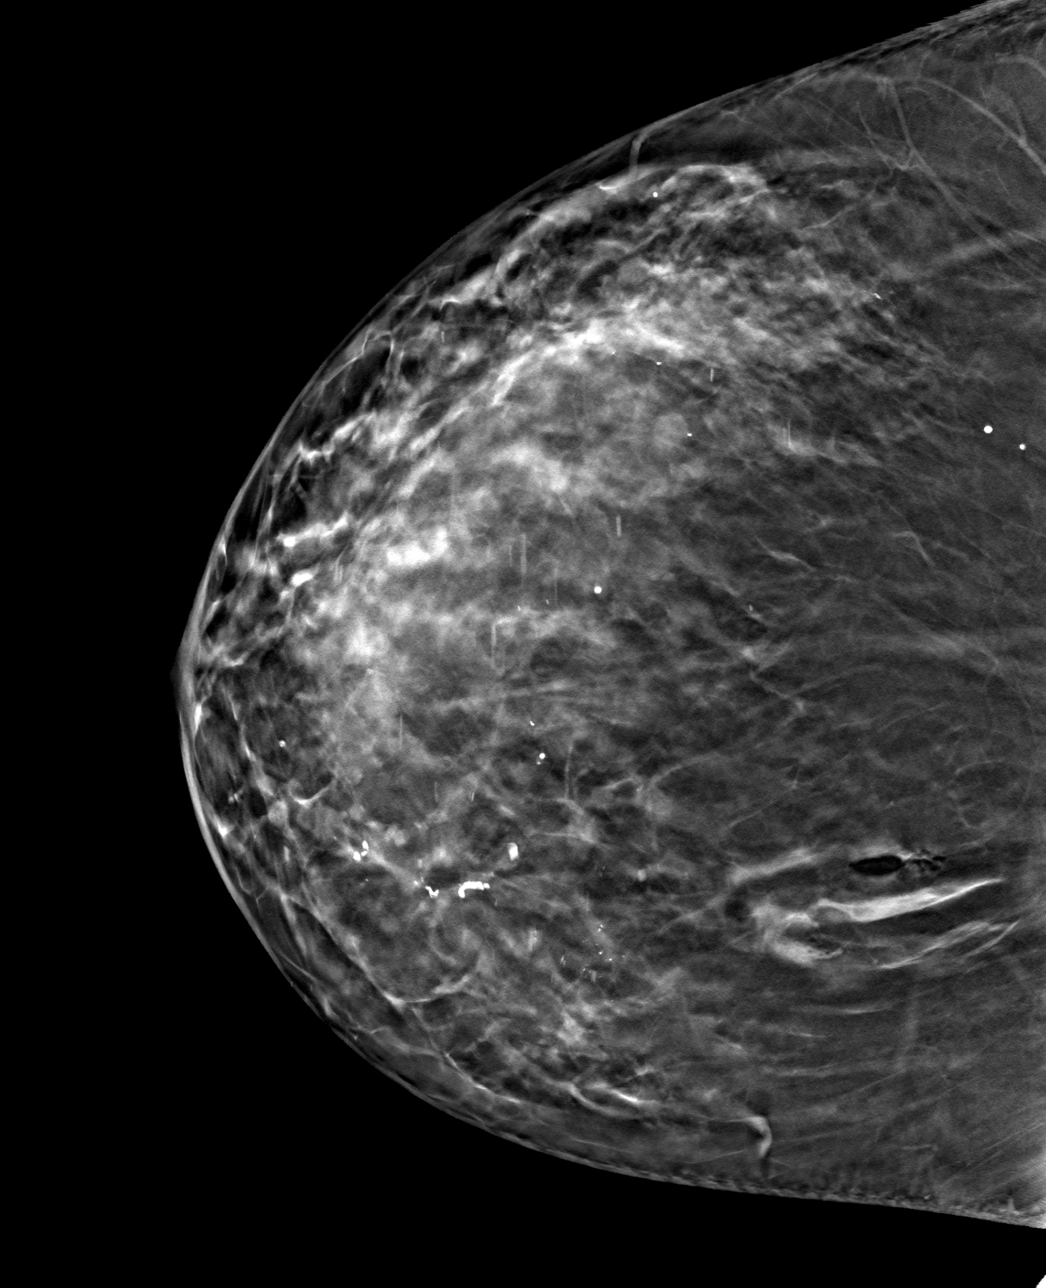

[R ML tomo · tomo slice 45/90.0]
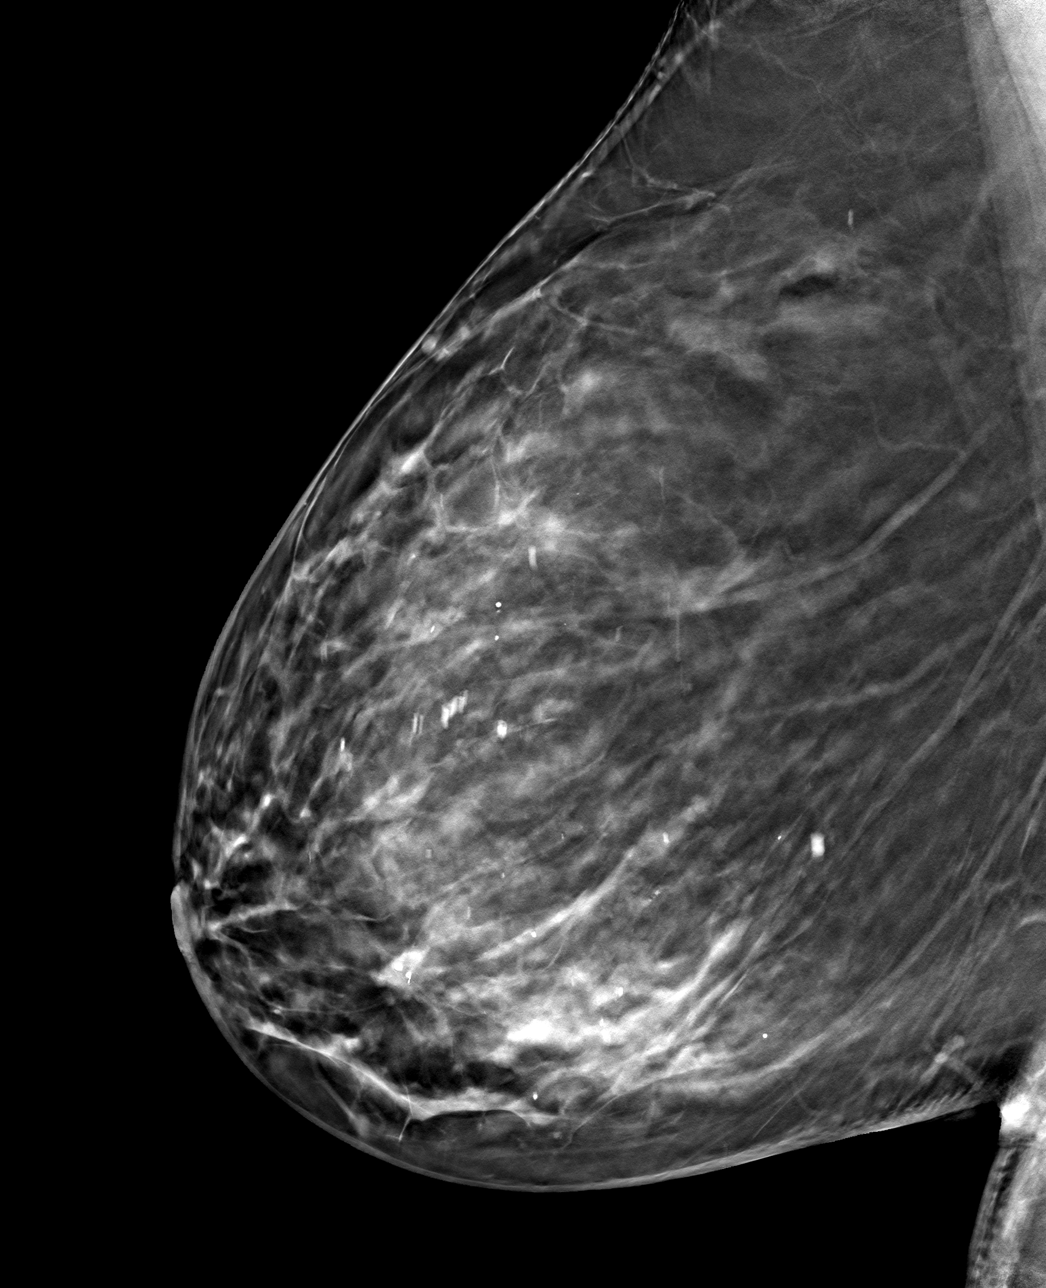

[4 of 12 positions shown; findings below may reference images not displayed]

FINDINGS: Mammographic images were obtained following stereotactic guided
biopsy of the right breast. The biopsy marking clip is in expected
position at the site of biopsy.
IMPRESSION: Appropriate positioning of the X shaped biopsy marking clip at the
site of biopsy in the upper inner right breast posteriorly.

Final Assessment: Post Procedure Mammograms for Marker Placement

## 2022-09-01 IMAGING — MG MM BREAST BX W/ LOC DEV 1ST LESION IMAGE BX SPEC STEREO GUIDE*R*
8 of 13 series · 8 of 29 positions shown · non-contrast
Comparison: Previous exams.
COMPARISON: Previous exams.

Addendum:
CLINICAL DATA: 55-year-old female with biopsy-proven bilateral
breast cancer presents for stereotactic biopsy of an additional,
indeterminate right breast mass.

EXAM:
RIGHT BREAST STEREOTACTIC CORE NEEDLE BIOPSY

[R (1 of 8)]
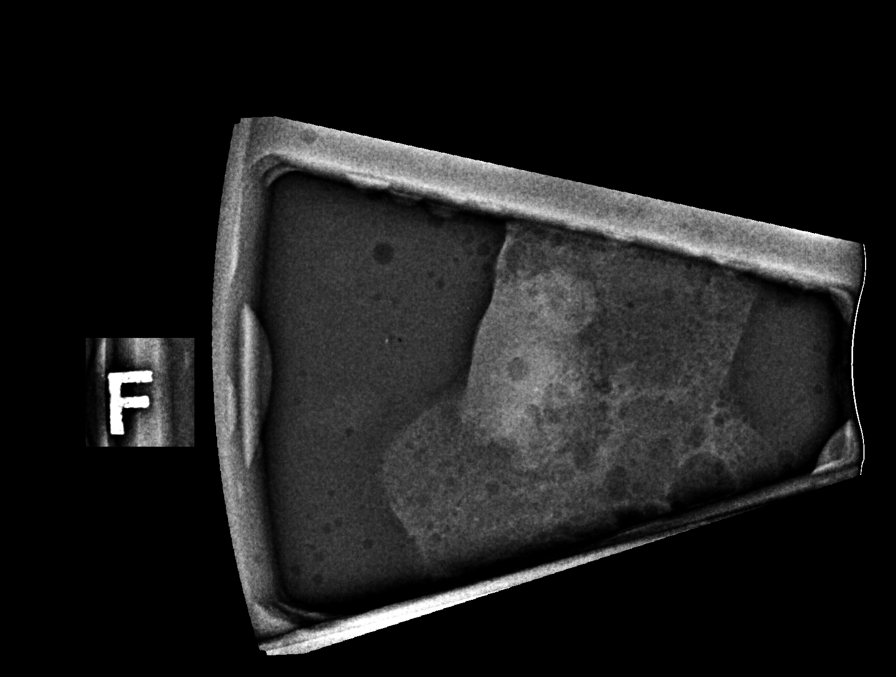

[R (2 of 8)]
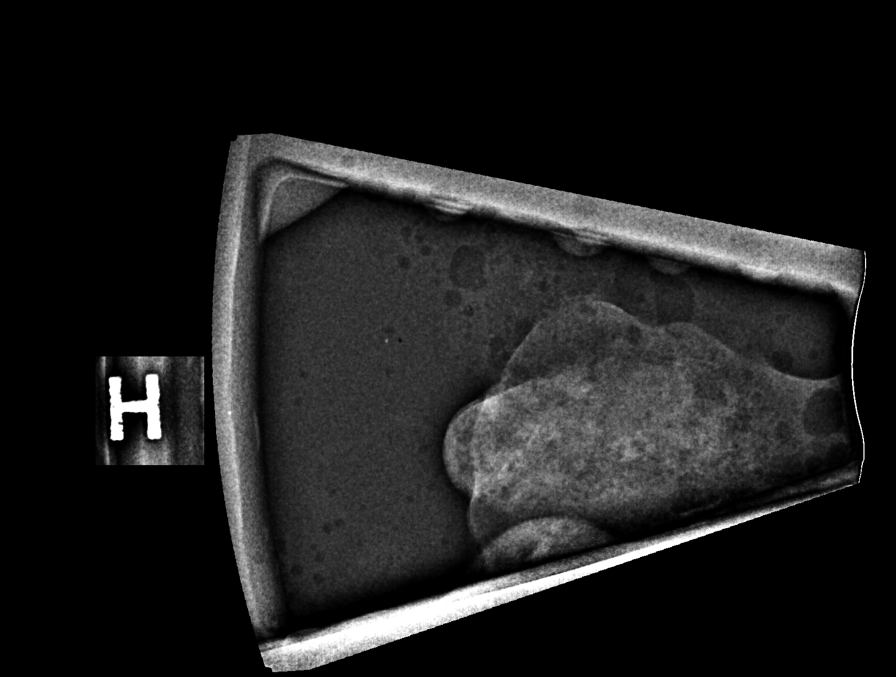

[R (3 of 8)]
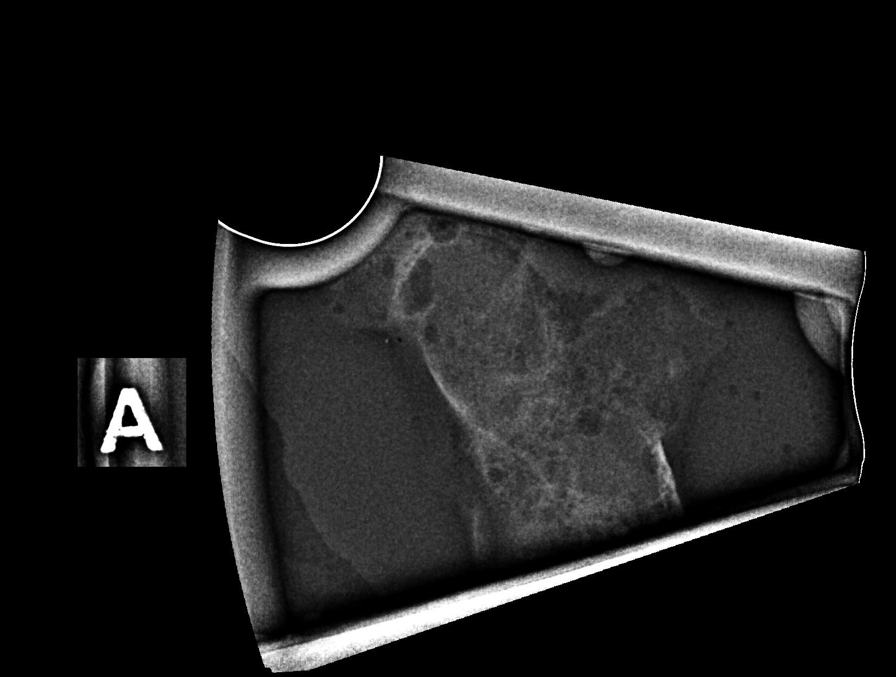

[R (4 of 8)]
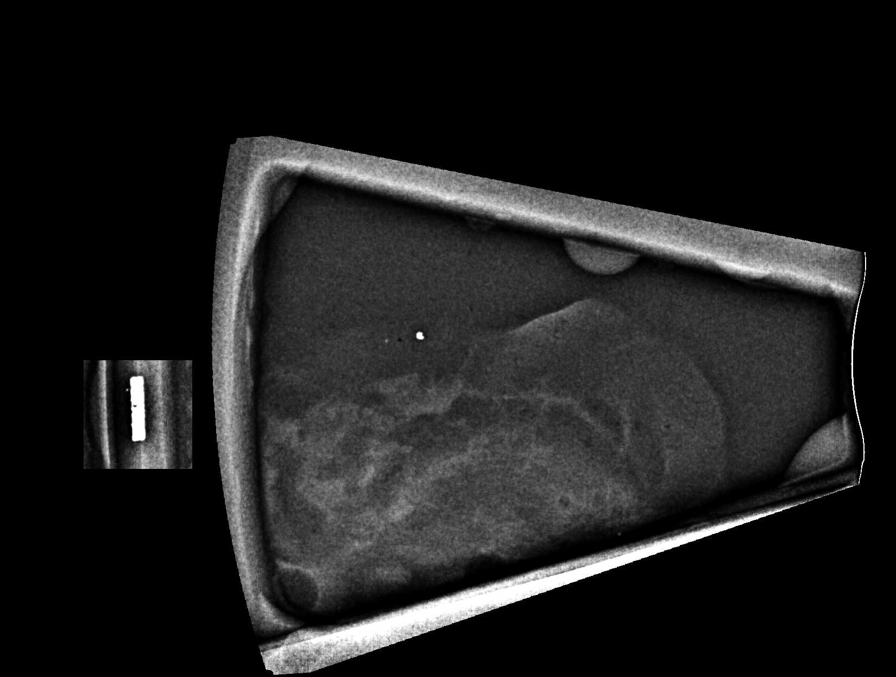

[R (5 of 8)]
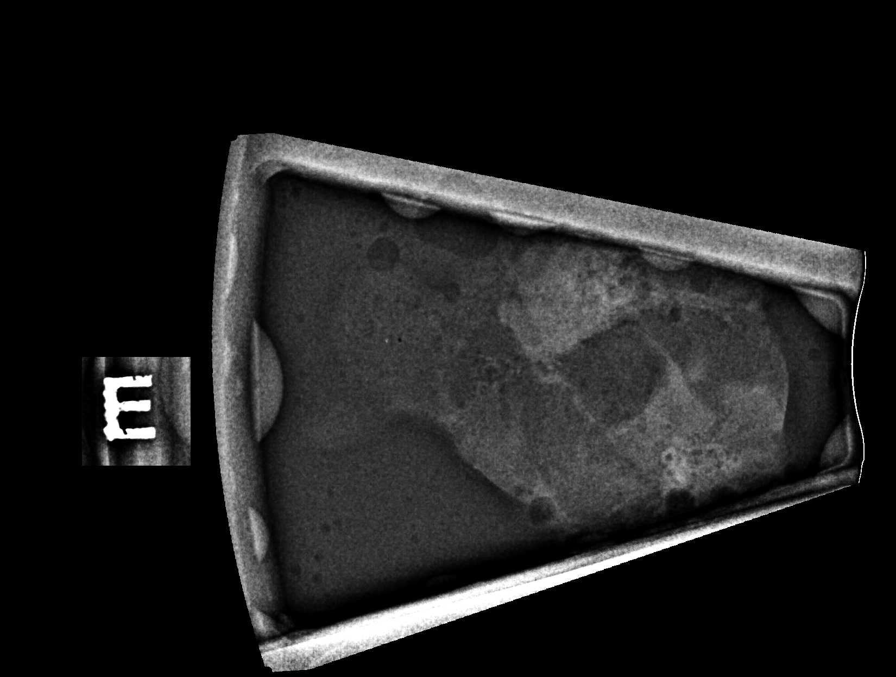

[R (6 of 8)]
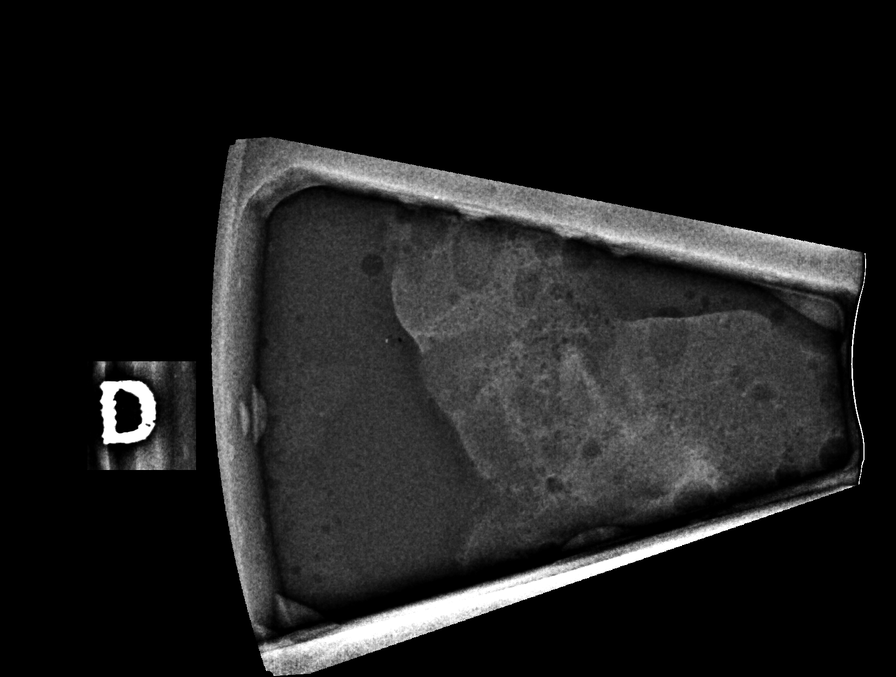

[R (7 of 8)]
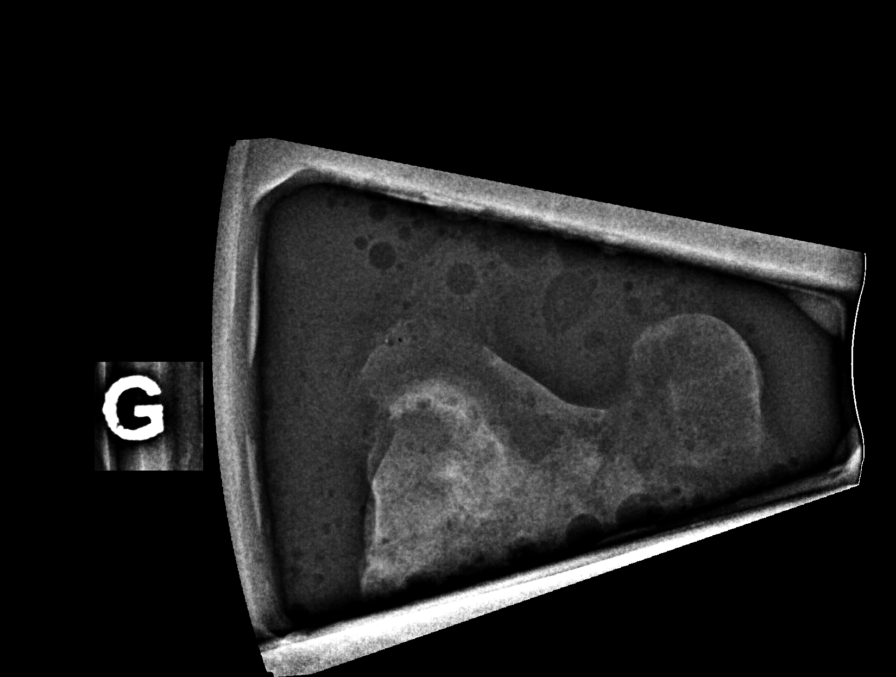

[R (8 of 8)]
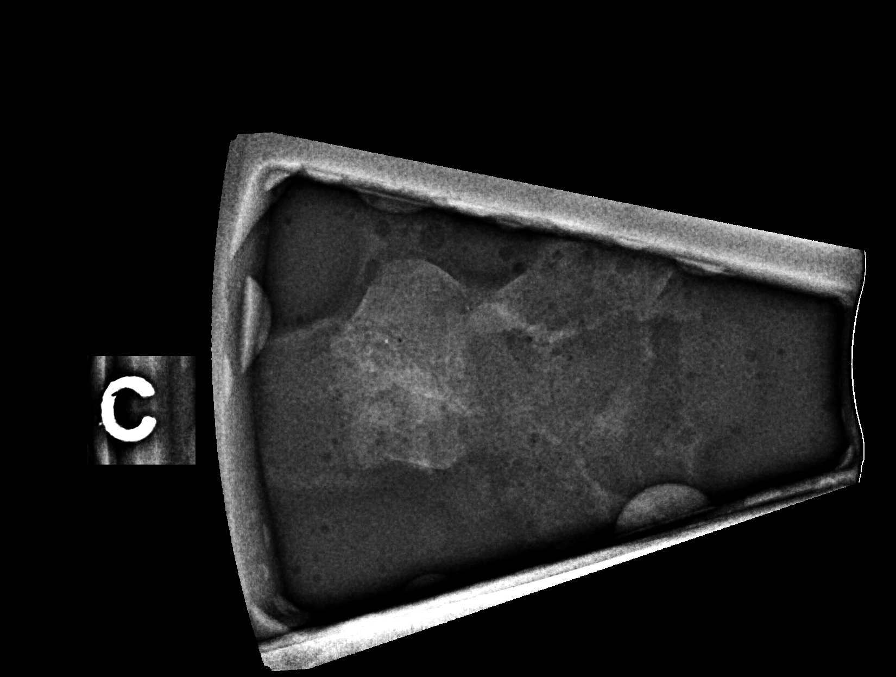

[8 of 29 positions shown; findings below may reference images not displayed]



Using sterile technique and 1% Lidocaine as local anesthetic, under
stereotactic guidance, a 9 gauge vacuum assisted device was used to
perform core needle biopsy of a mass in the upper inner quadrant of
the right breast using a superior approach.

Lesion quadrant: Upper inner quadrant

At the conclusion of the procedure, an X shaped tissue marker clip
was deployed into the biopsy cavity. Follow-up 2-view mammogram was
performed and dictated separately.
IMPRESSION: Stereotactic-guided biopsy of the right breast. No apparent
complications.

ADDENDUM:
Pathology revealed COMPLEX SCLEROSING LESION of the RIGHT breast,
upper inner quadrant, posterior. In addition there is a detached
fragment of atypical appearing epithelium. However, deeper levels
are more concerning for an atypical epithelial proliferation. In
consideration of the patient's recently diagnosed invasive ductal
carcinoma, excisional biopsy of this lesion is recommended. This was
found to be concordant by Dr. Chonhou O Sin, with excision
recommended.

Pathology results were discussed with the patient by telephone. The
patient reported doing well after the biopsy with tenderness at the
site. Post biopsy instructions and care were reviewed and questions
were answered. The patient was encouraged to call The [REDACTED] for any additional concerns. My direct phone
number was provided.

The patient has a recent diagnosis of BILATERAL breast cancer and
should follow her outlined treatment plan.

Pathology results reported by Dimpeile Siabatho, RN on 02/10/2021.



Using sterile technique and 1% Lidocaine as local anesthetic, under
stereotactic guidance, a 9 gauge vacuum assisted device was used to
perform core needle biopsy of a mass in the upper inner quadrant of
the right breast using a superior approach.

Lesion quadrant: Upper inner quadrant

At the conclusion of the procedure, an X shaped tissue marker clip
was deployed into the biopsy cavity. Follow-up 2-view mammogram was
performed and dictated separately.
IMPRESSION: Stereotactic-guided biopsy of the right breast. No apparent
complications.

## 2022-09-07 ENCOUNTER — Telehealth: Payer: Self-pay | Admitting: Adult Health

## 2022-09-07 ENCOUNTER — Other Ambulatory Visit (HOSPITAL_COMMUNITY): Payer: Self-pay

## 2022-09-07 NOTE — Telephone Encounter (Signed)
Rescheduled appointment per provider PAL. Patient is aware of the changes made to her upcoming appointment. 

## 2022-09-13 ENCOUNTER — Encounter: Payer: 59 | Admitting: Adult Health

## 2022-09-13 ENCOUNTER — Ambulatory Visit (HOSPITAL_COMMUNITY)
Admission: RE | Admit: 2022-09-13 | Discharge: 2022-09-13 | Disposition: A | Discharge status: Home / Self Care | Payer: 59 | Source: Ambulatory Visit | Attending: Adult Health | Admitting: Adult Health

## 2022-09-13 ENCOUNTER — Inpatient Hospital Stay: Payer: 59 | Attending: Oncology | Admitting: Adult Health

## 2022-09-13 VITALS — BP 160/96 | HR 82 | Temp 97.3°F | Resp 17 | Wt 286.0 lb

## 2022-09-13 DIAGNOSIS — C50511 Malignant neoplasm of lower-outer quadrant of right female breast: Secondary | ICD-10-CM | POA: Diagnosis not present

## 2022-09-13 DIAGNOSIS — E2839 Other primary ovarian failure: Secondary | ICD-10-CM | POA: Diagnosis not present

## 2022-09-13 DIAGNOSIS — Z9013 Acquired absence of bilateral breasts and nipples: Secondary | ICD-10-CM | POA: Insufficient documentation

## 2022-09-13 DIAGNOSIS — C50912 Malignant neoplasm of unspecified site of left female breast: Secondary | ICD-10-CM | POA: Diagnosis not present

## 2022-09-13 DIAGNOSIS — Z171 Estrogen receptor negative status [ER-]: Secondary | ICD-10-CM | POA: Diagnosis not present

## 2022-09-13 DIAGNOSIS — Z79811 Long term (current) use of aromatase inhibitors: Secondary | ICD-10-CM | POA: Diagnosis not present

## 2022-09-13 DIAGNOSIS — C50812 Malignant neoplasm of overlapping sites of left female breast: Secondary | ICD-10-CM | POA: Insufficient documentation

## 2022-09-13 DIAGNOSIS — Z170 Estrogen receptor positive status [ER+]: Secondary | ICD-10-CM | POA: Diagnosis not present

## 2022-09-13 DIAGNOSIS — R0602 Shortness of breath: Secondary | ICD-10-CM | POA: Insufficient documentation

## 2022-09-13 DIAGNOSIS — Z87891 Personal history of nicotine dependence: Secondary | ICD-10-CM | POA: Insufficient documentation

## 2022-09-13 DIAGNOSIS — C50911 Malignant neoplasm of unspecified site of right female breast: Secondary | ICD-10-CM | POA: Diagnosis not present

## 2022-09-13 NOTE — Progress Notes (Signed)
SURVIVORSHIP VISIT:  BRIEF ONCOLOGIC HISTORY:  Oncology History  Malignant neoplasm of lower-outer quadrant of right breast of female, estrogen receptor positive (Leslie Wright)  01/20/2021 Cancer Staging   Staging form: Breast, AJCC 8th Edition - Clinical stage from 01/20/2021: Stage IA (cT1c, cN0, cM0, G2, ER+, PR-, HER2-) - Signed by Leslie Cruel, MD on 01/25/2021 Stage prefix: Initial diagnosis Histologic grading system: 3 grade system   01/23/2021 Initial Diagnosis   Malignant neoplasm of lower-outer quadrant of right breast of female, estrogen receptor positive (Leslie Wright)   02/10/2021 - 06/17/2021 Chemotherapy   Patient is on Treatment Plan : BREAST Pembrolizumab + Carboplatin D1 + Paclitaxel D1,8,15 q21d X 4 cycles / Pembrolizumab + AC q21d x 4 cycles      Genetic Testing   Negative genetic testing. No pathogenic variants identified on the [facility] Multi-Cancer Panel+RNA. VUS in ATM called c.1132A>G identified. The report date is 02/13/2021.  The Multi-Cancer Panel + RNA offered by [facility] includes sequencing and/or deletion duplication testing of the following 84 genes: AIP, ALK, APC, ATM, AXIN2,BAP1,  BARD1, BLM, BMPR1A, BRCA1, BRCA2, BRIP1, CASR, CDC73, CDH1, CDK4, CDKN1B, CDKN1C, CDKN2A (p14ARF), CDKN2A (p16INK4a), CEBPA, CHEK2, CTNNA1, DICER1, DIS3L2, EGFR (c.2369C>T, p.Thr790Met variant only), EPCAM (Deletion/duplication testing only), FH, FLCN, GATA2, GPC3, GREM1 (Promoter region deletion/duplication testing only), HOXB13 (c.251G>A, p.Gly84Glu), HRAS, KIT, MAX, MEN1, MET, MITF (c.952G>A, p.Glu318Lys variant only), MLH1, MSH2, MSH3, MSH6, MUTYH, NBN, NF1, NF2, NTHL1, PALB2, PDGFRA, PHOX2B, PMS2, POLD1, POLE, POT1, PRKAR1A, PTCH1, PTEN, RAD50, RAD51C, RAD51D, RB1, RECQL4, RET, RUNX1, SDHAF2, SDHA (sequence changes only), SDHB, SDHC, SDHD, SMAD4, SMARCA4, SMARCB1, SMARCE1, STK11, SUFU, TERC, TERT, TMEM127, TP53, TSC1, TSC2, VHL, WRN and WT1.   08/08/2021 Surgery   She proceeded to bilateral  mastectomies on 08/08/2021 under Dr. Barry Wright. Pathology from the procedure [id number]) showed: 1. BREAST, RIGHT, MASTECTOMY:  -  Focal residual invasive ductal carcinoma, status post neoadjuvant  therapy  -  Extensive sclerosing adenosis with associated calcifications  -  Sclerotic intraductal papilloma  -  Previous biopsy site changes present  -  See oncology table and comment below  2. LYMPH NODE, RIGHT AXILLARY, SENTINEL, EXCISION:  -  No carcinoma identified (0/2)  3. BREAST, LEFT, MASTECTOMY:  -  Residual multifocal invasive ductal carcinoma, status post  neoadjuvant therapy  -  Ductal carcinoma in-situ, high grade  -  Calcifications associated with carcinoma  -  Margins uninvolved by carcinoma (0.2 cm; anterior margin)  -  No carcinoma identified in one lymph node (0/1)  -  Lymphovascular space invasion present  -  Previous biopsy site changes present  -  See oncology table below  4. LYMPH NODE, LEFT AXILLARY, SENTINEL, EXCISION:  -  No carcinoma identified (0/4)     10/05/2021 - 11/27/2021 Radiation Therapy   10/05/2021 through 11/27/2021 Site Technique Total Dose (Gy) Dose per Fx (Gy) Completed Fx Beam Energies  Chest Wall, Left: CW_L 3D 50.4/50.4 1.8 28/28 10X  Chest Wall, Left: CW_L_SCLV 3D 50.4/50.4 1.8 28/28 6X, 10X  Chest Wall, Left: CW_L_Bst specialPort 10/10 2 5/5 9E    12/12/2021 -  Chemotherapy   Xeloda 532m, 2 tablets twice a day 14 days on, 7 days off x 6 months   Malignant neoplasm of overlapping sites of left breast in female, estrogen receptor negative ([place]s  01/20/2021 Cancer Staging   Staging form: Breast, AJCC 8th Edition - Clinical stage from 01/20/2021: Stage IIIB (cT3, cN0, cM0, G3, ER-, PR-, HER2-) - Signed by Leslie Cruel MD on 01/25/2021 Histologic grading  system: 3 grade system   01/25/2021 Initial Diagnosis   Malignant neoplasm of overlapping sites of left breast in female, estrogen receptor negative (Leslie Wright)   02/10/2021 - 06/17/2021  Chemotherapy   Patient is on Treatment Plan : BREAST Pembrolizumab + Carboplatin D1 + Paclitaxel D1,8,15 q21d X 4 cycles / Pembrolizumab + AC q21d x 4 cycles      Genetic Testing   Negative genetic testing. No pathogenic variants identified on the [employer] Multi-Cancer Panel+RNA. VUS in ATM called c.1132A>G identified. The report date is 02/13/2021.  The Multi-Cancer Panel + RNA offered by [facility] includes sequencing and/or deletion duplication testing of the following 84 genes: AIP, ALK, APC, ATM, AXIN2,BAP1,  BARD1, BLM, BMPR1A, BRCA1, BRCA2, BRIP1, CASR, CDC73, CDH1, CDK4, CDKN1B, CDKN1C, CDKN2A (p14ARF), CDKN2A (p16INK4a), CEBPA, CHEK2, CTNNA1, DICER1, DIS3L2, EGFR (c.2369C>T, p.Thr790Met variant only), EPCAM (Deletion/duplication testing only), FH, FLCN, GATA2, GPC3, GREM1 (Promoter region deletion/duplication testing only), HOXB13 (c.251G>A, p.Gly84Glu), HRAS, KIT, MAX, MEN1, MET, MITF (c.952G>A, p.Glu318Lys variant only), MLH1, MSH2, MSH3, MSH6, MUTYH, NBN, NF1, NF2, NTHL1, PALB2, PDGFRA, PHOX2B, PMS2, POLD1, POLE, POT1, PRKAR1A, PTCH1, PTEN, RAD50, RAD51C, RAD51D, RB1, RECQL4, RET, RUNX1, SDHAF2, SDHA (sequence changes only), SDHB, SDHC, SDHD, SMAD4, SMARCA4, SMARCB1, SMARCE1, STK11, SUFU, TERC, TERT, TMEM127, TP53, TSC1, TSC2, VHL, WRN and WT1.   07/28/2021 - 09/07/2021 Chemotherapy   Patient is on Treatment Plan : HEAD/NECK Pembrolizumab Q21D     08/08/2021 Surgery   She proceeded to bilateral mastectomies on 08/08/2021 under Dr. Barry Wright. Pathology from the procedure [id number]) showed: 1. BREAST, RIGHT, MASTECTOMY:  -  Focal residual invasive ductal carcinoma, status post neoadjuvant  therapy  -  Extensive sclerosing adenosis with associated calcifications  -  Sclerotic intraductal papilloma  -  Previous biopsy site changes present  -  See oncology table and comment below  2. LYMPH NODE, RIGHT AXILLARY, SENTINEL, EXCISION:  -  No carcinoma identified (0/2)  3. BREAST, LEFT,  MASTECTOMY:  -  Residual multifocal invasive ductal carcinoma, status post  neoadjuvant therapy  -  Ductal carcinoma in-situ, high grade  -  Calcifications associated with carcinoma  -  Margins uninvolved by carcinoma (0.2 cm; anterior margin)  -  No carcinoma identified in one lymph node (0/1)  -  Lymphovascular space invasion present  -  Previous biopsy site changes present  -  See oncology table below  4. LYMPH NODE, LEFT AXILLARY, SENTINEL, EXCISION:  -  No carcinoma identified (0/4)     10/05/2021 - 11/27/2021 Radiation Therapy   10/05/2021 through 11/27/2021 Site Technique Total Dose (Gy) Dose per Fx (Gy) Completed Fx Beam Energies  Chest Wall, Left: CW_L 3D 50.4/50.4 1.8 28/28 10X  Chest Wall, Left: CW_L_SCLV 3D 50.4/50.4 1.8 28/28 6X, 10X  Chest Wall, Left: CW_L_Bst specialPort 10/10 2 5/5 9E    12/12/2021 -  Chemotherapy   Xeloda 552m, 2 tablets twice a day 14 days on, 7 days off x 6 months     INTERVAL HISTORY:  Ms. JSciolito review her survivorship care plan detailing her treatment course for breast cancer, as well as monitoring long-term side effects of that treatment, education regarding health maintenance, screening, and overall wellness and health promotion.     Overall, Ms. JRabeloreports feeling quite well.  She notes that over the past two weeks she has noted an increase in her shortness of breath.  This [place]    REVIEW OF SYSTEMS:  Review of Systems  Constitutional:  Negative for appetite change, chills, fatigue, fever and unexpected weight change.  HENT:   Negative for hearing loss, lump/mass and trouble swallowing.   Eyes:  Negative for eye problems and icterus.  Respiratory:  Positive for shortness of breath. Negative for chest tightness, cough and wheezing.   Cardiovascular:  Negative for chest pain, leg swelling and palpitations.  Gastrointestinal:  Negative for abdominal distention,  abdominal pain, constipation, diarrhea, nausea and vomiting.  Endocrine: Negative for hot flashes.  Genitourinary:  Negative for difficulty urinating.   Musculoskeletal:  Negative for arthralgias.  Skin:  Negative for itching and rash.  Neurological:  Positive for numbness (toes, fingertips, and bilateral underarms). Negative for dizziness, extremity weakness and headaches.  Hematological:  Negative for adenopathy. Does not bruise/bleed easily.  Psychiatric/Behavioral:  Negative for depression. The patient is not nervous/anxious.   Breast: Denies any new nodularity, masses, tenderness, nipple changes, or nipple discharge.         PAST MEDICAL/SURGICAL HISTORY:  Past Medical History:  Diagnosis Date   Acid reflux    Anemia    Arthritis    "maybe in my fingers" (03/26/2018)   Breast cancer ([place]) 12/2020   both sides   Concussion 2001   no deficit had tia right after no problems since   Family history of adverse reaction to anesthesia    "brother, Timmy, and my mom always get sick" (03/26/2018)   Family history of brain cancer    Family history of breast cancer    Family history of colon cancer    Family history of lung cancer    Family history of pancreatic cancer    Family history of prostate cancer    Headache    High blood pressure    High cholesterol    Hip pain    "left; before OR" (03/26/2018)   History of chemotherapy last done 02-17-2021   History of hiatal hernia    History of kidney stones    OSA (obstructive sleep apnea)    "can't tolerate the mask" (03/26/2018)    PONV (postoperative nausea and vomiting)    does not need much anesthesia, slow to awlaekn in past   Pre-diabetes    Shortness of breath    with exertion   TIA (transient ischemic attack)    15 years ago   Wears partial dentures upper   Past Surgical History:  Procedure Laterality Date   CARPAL TUNNEL RELEASE Right ~ 2006   JOINT REPLACEMENT Left    total left hip   LAPAROSCOPIC CHOLECYSTECTOMY   12/2010   LASIK Bilateral 2000   PORT A CATH REVISION Left 02/21/2021   Procedure: PORT A CATH REVISION;  Surgeon: Stark Klein, MD;  Location: [facility];  Service: General;  Laterality: Left;  60 MINUTES ROOM 5   PORT-A-CATH REMOVAL Left 10/11/2021   Procedure: PORT REMOVAL;  Surgeon: Stark Klein, MD;  Location: [facility];  Service: General;  Laterality: Left;   PORTACATH PLACEMENT N/A 02/08/2021   Procedure: INSERTION PORT-A-CATH;  Surgeon: Stark Klein, MD;  Location: [facility];  Service: General;  Laterality: N/A;   RADIOACTIVE SEED GUIDED AXILLARY SENTINEL LYMPH NODE Right 08/08/2021   Procedure: RADIOACTIVE SEED GUIDED RIGHT AXILLARY SENTINEL LYMPH NODE BIOPSY;  Surgeon: Stark Klein, MD;  Location: [facility];  Service: General;  Laterality: Right;   SENTINEL NODE BIOPSY Bilateral 08/08/2021   Procedure: BILATERAL SENTINEL NODE BIOPSY;  Surgeon: Stark Klein, MD;  Location: [facility];  Service: General;  Laterality: Bilateral;   TOTAL HIP ARTHROPLASTY Left 03/25/2018   Procedure: LEFT TOTAL HIP ARTHROPLASTY ANTERIOR APPROACH;  Surgeon: Mcarthur Rossetti, MD;  Location: [facility];  Service: Orthopedics;  Laterality: Left;   TOTAL MASTECTOMY Bilateral 08/08/2021   Procedure: BILATERAL TOTAL MASTECTOMY;  Surgeon: Stark Klein, MD;  Location: [facility];  Service: General;  Laterality: Bilateral;     ALLERGIES:  No Known Allergies   CURRENT MEDICATIONS:  Outpatient Encounter Medications as of 09/13/2022  Medication Sig   acetaminophen (TYLENOL) 500 MG tablet Take 1,000 mg by mouth every 6 (six) hours as needed for moderate pain, mild pain or headache.   anastrozole (ARIMIDEX) 1 MG tablet Take 1 tablet (1 mg total) by mouth daily.   aspirin EC 81 MG tablet Take 81 mg by mouth daily.   famotidine (PEPCID) 40 MG tablet Take 20 mg by mouth daily.   loperamide (IMODIUM) 2 MG capsule Take by mouth as needed for diarrhea or loose stools. Take 2 tabs (4 mg) with first  loose stool, then 1 tab (2 mg) with each additional loose stool. Do not take more than 8 tabs (16 mg) in a 24-hour period.   losartan (COZAAR) 100 MG tablet Take 1 tablet (100 mg total) by mouth daily.   rosuvastatin (CRESTOR) 5 MG tablet Take 1 tablet (5 mg total) by mouth at bedtime for high cholesterol.   [DISCONTINUED] capecitabine (XELODA) 500 MG tablet Take 2 tablets (1,000 mg total) by mouth 2 (two) times daily after a meal. Take for 14 days, then hold for 7 days. Repeat every 21 days.   [DISCONTINUED] losartan (COZAAR) 100 MG tablet Take 1 tablet by mouth daily   [DISCONTINUED] ondansetron (ZOFRAN) 8 MG tablet Take 1 tablet (8 mg total) by mouth 2 (two) times daily as needed. Start on the third day after carboplatin and AC chemotherapy.   [DISCONTINUED] prochlorperazine (COMPAZINE) 10 MG tablet Take 1 tablet (10 mg total) by mouth every 6 (six) hours as needed (Nausea or vomiting).   No facility-administered encounter medications on file as of 09/13/2022.     ONCOLOGIC FAMILY HISTORY:  Family History  Problem Relation Age of Onset   Diabetes Mother    Thyroid disease Mother    Liver disease Mother    Obesity Mother    Hypertension Father    Hyperlipidemia Father    Heart disease Father    Stroke Father    Obesity Father    Colon polyps Father    Prostate cancer Brother    Brain cancer Maternal Grandfather    Pancreatic cancer Paternal Grandmother    Colon cancer Neg Hx        No one in family with colon cancer.      SOCIAL HISTORY:  Social History   Socioeconomic History   Marital status: Single    Spouse name: Not on file   Number of children: Not on file   Years of education: Not on file   Highest education level: Not on file  Occupational History   Occupation: Orthoptist    Employer: [employer]  Tobacco Use   Smoking status: Former    Types: Cigarettes   Smokeless tobacco: Never   Tobacco comments:    In teens social  Vaping Use   Vaping Use: Never used   Substance and Sexual Activity   Alcohol use: Yes    Comment: Occasional   Drug use: Never   Sexual activity: Not Currently    Birth control/protection: Post-menopausal  Other Topics Concern   Not on file  Social History Narrative   Not on file   Social Determinants of Health   Financial Resource Strain: Not on file  Food Insecurity: Not on file  Transportation Needs: Not on file  Physical Activity: Not on file  Stress: Not on file  Social Connections: Not on file  Intimate Partner Violence: Not on file     OBSERVATIONS/OBJECTIVE:  BP (!) 160/96   Pulse 82   Temp (!) 97.3 F (36.3 C) (Temporal)   Resp 17   Wt 286 lb (129.7 kg)   LMP 01/21/2018   SpO2 96%   BMI 44.79 kg/m  GENERAL: Patient is a well appearing female in no acute distress HEENT:  Sclerae anicteric.  Oropharynx clear and moist. No ulcerations or evidence of oropharyngeal candidiasis. Neck is supple.  NODES:  No cervical, supraclavicular, or axillary lymphadenopathy palpated.  BREAST EXAM:  s/p bilateral mastectomies, no sign of local recurrence LUNGS:  Clear to auscultation bilaterally.  No wheezes or rhonchi. HEART:  Regular rate and rhythm. No murmur appreciated. ABDOMEN:  Soft, nontender.  Positive, normoactive bowel sounds. No organomegaly palpated. MSK:  No focal spinal tenderness to palpation. Full range of motion bilaterally in the upper extremities. EXTREMITIES:  No peripheral edema.   SKIN:  Clear with no obvious rashes or skin changes. No nail dyscrasia. NEURO:  Nonfocal. Well oriented.  Appropriate affect.  LABORATORY DATA:  None for this visit.  DIAGNOSTIC IMAGING:  None for this visit.      ASSESSMENT AND PLAN:  Ms.. Wright is a pleasant 56 y.o. 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  She presents to the Survivorship Clinic for our initial meeting and routine follow-up post-completion of treatment for breast cancer.    1. Stage 3B left breast cancer:  Leslie Wright is continuing to recover from definitive treatment for breast cancer. She will follow-up with me in 12/2022 with history and physical exam per surveillance protocol.  Today, a comprehensive survivorship care plan and treatment summary was reviewed with the patient today detailing her breast cancer diagnosis, treatment course, potential late/long-term effects of treatment, appropriate follow-up care with recommendations for the future, and patient education resources.  A copy of this summary, along with a letter will be sent to the patient's primary care provider via mail/fax/In Basket message after today's visit.    2. Shortness of breath on exertion: We will obtain chest xray to further evaluate.  If negative will consider cardiac work up considering CT chest/abd/pelvis in 07/2022 was negative for pulmonary concerns.  3. Bone health:  I placed orders for bone density testing to be completed int he next 1-2 weeks.  She was given education on specific activities to promote bone health.  4. Cancer screening:  Due to Leslie Wright history and her age, she should receive screening for skin cancers, colon cancer, and gynecologic cancers.  The information and recommendations are listed on the patient's comprehensive care plan/treatment summary and were reviewed in detail with the patient.    5. Health maintenance and wellness promotion: Leslie Wright was encouraged to consume 5-7 servings of fruits and vegetables per day. We reviewed the "Nutrition Rainbow" handout.  She was also encouraged to engage in moderate to vigorous exercise for 30 minutes per day most days of the week. We discussed the [employer] fitness  program, which is designed for cancer survivors to help them become more physically fit after cancer treatments.  She  was instructed to limit her alcohol consumption and continue to abstain from tobacco use.     6. Support services/counseling: It is not uncommon for this period of the patient's cancer care trajectory to be one of many emotions and stressors. She was given information regarding our available services and encouraged to contact me with any questions or for help enrolling in any of our support group/programs.    Follow up instructions:    -Return to cancer center in 12/2022 for f/u  -chest xray -DEXA scan -She is welcome to return back to the Survivorship Clinic at any time; no additional follow-up needed at this time.  -Consider referral back to survivorship as a long-term survivor for continued surveillance  The patient was provided an opportunity to ask questions and all were answered. The patient agreed with the plan and demonstrated an understanding of the instructions.   Total encounter time:40 minutes*in face-to-face visit time, chart review, lab review, care coordination, order entry, and documentation of the encounter time.    Wilber Bihari, NP 09/14/22 4:36 PM Medical Oncology and Hematology [facility] [place], [place] [address] Tel. [phone]    Fax. [phone]  *Total Encounter Time as defined by the Centers for Medicare and Medicaid Services includes, in addition to the face-to-face time of a patient visit (documented in the note above) non-face-to-face time: obtaining and reviewing outside history, ordering and reviewing medications, tests or procedures, care coordination (communications with other health care professionals or caregivers) and documentation in the medical record.

## 2022-09-14 ENCOUNTER — Encounter: Payer: Self-pay | Admitting: Adult Health

## 2022-09-17 ENCOUNTER — Telehealth: Payer: Self-pay | Admitting: Adult Health

## 2022-09-17 ENCOUNTER — Other Ambulatory Visit: Payer: Self-pay

## 2022-09-17 ENCOUNTER — Telehealth: Payer: Self-pay

## 2022-09-17 DIAGNOSIS — Z79899 Other long term (current) drug therapy: Secondary | ICD-10-CM

## 2022-09-17 DIAGNOSIS — Z170 Estrogen receptor positive status [ER+]: Secondary | ICD-10-CM

## 2022-09-17 DIAGNOSIS — Z5111 Encounter for antineoplastic chemotherapy: Secondary | ICD-10-CM

## 2022-09-17 NOTE — Telephone Encounter (Signed)
Called pt to find out where she had colonoscopy. Pt states she never has had one d/t persistent medical concerns which her attention was focused to. She is aware she needs one and will call PCP this week to have them set it up for her. She knows to call us with any concerns.

## 2022-09-17 NOTE — Telephone Encounter (Signed)
-----   Message from Gardenia Phlegm, NP sent at 09/14/2022  4:37 PM EST ----- We need PCP records from Dr. Rachell Cipro office so we can understand the following:  Last colonoscopy Last Pap Vaccine history  Can you please ask PCP to fax [place] these records and bring to me once you receive them?  Thank you,  Leslie Wright

## 2022-09-17 NOTE — Telephone Encounter (Signed)
-----   Message from Gardenia Phlegm, NP sent at 09/17/2022 12:54 PM EST ----- Did I send a note on this?  Her heart size is borderline.  Considering the adriamycin, keytruda, and shortness of breath, I would recommend she see cardiology.  Please refer to Dr. Harl Bowie in [facility] cardiology in [place] (I think she is at [facility]).  I will send MB a message.   ----- Message ----- From: Interface, Rad Results In Sent: 09/15/2022  12:45 AM EST To: Gardenia Phlegm, NP

## 2022-09-17 NOTE — Telephone Encounter (Signed)
Urgent Cardiology referral entered per NP.

## 2022-09-17 NOTE — Telephone Encounter (Signed)
Scheduled appointment per 12/14 los. Patient is aware.

## 2022-09-17 NOTE — Telephone Encounter (Signed)
Called Dr [facility] office to request stated records. Baxter Flattery in the office states they do not have a colonoscopy on records for her. She will fax all other documents to number I provided.

## 2022-09-17 NOTE — Telephone Encounter (Signed)
-----   Message from Gardenia Phlegm, NP sent at 09/14/2022  4:37 PM EST ----- We need PCP records from Dr. Rachell Cipro office so we can understand the following:  Last colonoscopy Last Pap Vaccine history  Can you please ask PCP to fax [place] these records and bring to me once you receive them?  Thank you,  Mendel Ryder

## 2022-09-18 ENCOUNTER — Encounter: Payer: Self-pay | Admitting: *Deleted

## 2022-09-19 ENCOUNTER — Ambulatory Visit: Payer: 59 | Attending: Internal Medicine | Admitting: Internal Medicine

## 2022-09-19 ENCOUNTER — Encounter: Payer: Self-pay | Admitting: Internal Medicine

## 2022-09-19 VITALS — BP 136/85 | HR 76 | Ht 66.0 in | Wt 286.0 lb

## 2022-09-19 DIAGNOSIS — R0600 Dyspnea, unspecified: Secondary | ICD-10-CM

## 2022-09-19 LAB — TROPONIN T: Troponin T (Highly Sensitive): 14 ng/L (ref 0–14)

## 2022-09-19 LAB — BRAIN NATRIURETIC PEPTIDE: BNP: 14.5 pg/mL (ref 0.0–100.0)

## 2022-09-19 NOTE — Patient Instructions (Signed)
Medication Instructions:  No Changes *If you need a refill on your cardiac medications before your next appointment, please call your pharmacy*   Lab Work: BNP, Troponin  If you have labs (blood work) drawn today and your tests are completely normal, you will receive your results only by: [place] (if you have MyChart) OR A paper copy in the mail If you have any lab test that is abnormal or we need to change your treatment, we will call you to review the results.   Testing/Procedures: Your physician has requested that you have an echocardiogram. Echocardiography is a painless test that uses sound waves to create images of your heart. It provides your doctor with information about the size and shape of your heart and how well your heart's chambers and valves are working. This procedure takes approximately one hour. There are no restrictions for this procedure. Please do NOT wear cologne, perfume, aftershave, or lotions (deodorant is allowed). Please arrive 15 minutes prior to your appointment time.    Follow-Up: At [facility], you and your health needs are our priority.  As part of our continuing mission to provide you with exceptional heart care, we have created designated Provider Care Teams.  These Care Teams include your primary Cardiologist (physician) and Advanced Practice Providers (APPs -  Physician Assistants and Nurse Practitioners) who all work together to provide you with the care you need, when you need it.  We recommend signing up for the patient portal called "MyChart".  Sign up information is provided on this After Visit Summary.  MyChart is used to connect with patients for Virtual Visits (Telemedicine).  Patients are able to view lab/test results, encounter notes, upcoming appointments, etc.  Non-urgent messages can be sent to your provider as well.   To learn more about what you can do with MyChart, go to [email or web address].    Your next appointment:    3 month(s)  The format for your next appointment:   In Person  Provider:   Janina Mayo, MD

## 2022-09-19 NOTE — Progress Notes (Signed)
Cardiology Office Note:    Date:  09/19/2022   ID:  Leslie Wright, DOB May 31, 1966, MRN [id number]  PCP:  Fanny Bien, MD   [place] Providers Cardiologist:  None     Referring MD: Fanny Bien, MD   No chief complaint on file. Cardiotoxic Chemo/SOB  History of Present Illness:    Leslie Wright is a 56 y.o. female with a hx of DCT, GERD, HLD, sleep apnea , difficulty with CPAP, R breast cancer, diagnosed 01/20/2021, ER+, HER2-, managed with pembrolizumab, s/p BL mastectomies 08/08/2021, R XRT, residual L breast ductal carcinoma (50+50+10) 1/52023-11/27/2021, on xeloda 12/12/2021 x6 months, on anastrozole; she is in survivorship clinic, referral from [facility] for shortness of breath. She is cancer free. She notes SOB during therapy. She notes 3 weeks she notes some SOB. She was walking with one of her friends.  It was less than a mile  No orthopnea/PND. Some ankle/feet swelling. She went to [facility] and her feet were swollen. She sits all day for medical billing. She typically walks with her dogs. She tried compression stockings, they helped.  No cardiac dx history  Social Hx: no long-term smoking  Family Hx: Father had an MI, CVA. Lung CA. MGF had Mis.  Uncle on her father's side that had MI at 4. Cousin had MI at 78.    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 (not on CT scan; no further imaging)  Past Medical History:  Diagnosis Date   Acid reflux    Anemia    Arthritis    "maybe in my fingers" (03/26/2018)   Breast cancer ([place]) 12/2020   both sides   Concussion 2001   no deficit had tia right after no problems since   Family history of adverse reaction to anesthesia    "brother, Timmy, and my mom always get sick" (03/26/2018)   Family history of brain cancer     Family history of breast cancer    Family history of colon cancer    Family history of lung cancer    Family history of pancreatic cancer    Family history of prostate cancer    Headache    High blood pressure    High cholesterol    Hip pain    "left; before OR" (03/26/2018)   History of chemotherapy last done 02-17-2021   History of hiatal hernia    History of kidney stones    OSA (obstructive sleep apnea)    "can't tolerate the mask" (03/26/2018)    PONV (postoperative nausea and vomiting)    does not need much anesthesia, slow to awlaekn in past   Pre-diabetes    Shortness of breath    with exertion   TIA (transient ischemic attack)    15 years ago   Wears partial dentures upper    Past Surgical History:  Procedure Laterality Date   CARPAL TUNNEL RELEASE Right ~ 2006   JOINT REPLACEMENT Left    total left hip   LAPAROSCOPIC CHOLECYSTECTOMY  12/2010   LASIK Bilateral 2000   PORT A CATH REVISION Left 02/21/2021   Procedure: PORT A CATH REVISION;  Surgeon: Stark Klein, MD;  Location: [place];  Service: General;  Laterality: Left;  60 MINUTES ROOM 5   PORT-A-CATH REMOVAL  Left 10/11/2021   Procedure: PORT REMOVAL;  Surgeon: Stark Klein, MD;  Location: [place];  Service: General;  Laterality: Left;   PORTACATH PLACEMENT N/A 02/08/2021   Procedure: INSERTION PORT-A-CATH;  Surgeon: Stark Klein, MD;  [facility];  Service: General;  Laterality: N/A;   RADIOACTIVE SEED GUIDED AXILLARY SENTINEL LYMPH NODE Right 08/08/2021   Procedure: RADIOACTIVE SEED GUIDED RIGHT AXILLARY SENTINEL LYMPH NODE BIOPSY;  Surgeon: Stark Klein, MD;  Location: [place];  Service: General;  Laterality: Right;   SENTINEL NODE BIOPSY Bilateral 08/08/2021   Procedure: BILATERAL SENTINEL NODE BIOPSY;  Surgeon: Stark Klein, MD;  Location: [place];  Service: General;  Laterality: Bilateral;   TOTAL HIP ARTHROPLASTY Left 03/25/2018   Procedure: LEFT TOTAL HIP ARTHROPLASTY  ANTERIOR APPROACH;  Surgeon: Mcarthur Rossetti, MD;  Location: [place];  Service: Orthopedics;  Laterality: Left;   TOTAL MASTECTOMY Bilateral 08/08/2021   Procedure: BILATERAL TOTAL MASTECTOMY;  Surgeon: Stark Klein, MD;  Location: [place];  Service: General;  Laterality: Bilateral;    Current Medications: No outpatient medications have been marked as taking for the 09/19/22 encounter (Appointment) with Janina Mayo, MD.     Allergies:   Patient has no known allergies.   Social History   Socioeconomic History   Marital status: Single    Spouse name: Not on file   Number of children: Not on file   Years of education: Not on file   Highest education level: Not on file  Occupational History   Occupation: Orthoptist    Employer: [employer]  Tobacco Use   Smoking status: Former    Types: Cigarettes   Smokeless tobacco: Never   Tobacco comments:    In teens social  Vaping Use   Vaping Use: Never used  Substance and Sexual Activity   Alcohol use: Yes    Comment: Occasional   Drug use: Never   Sexual activity: Not Currently    Birth control/protection: Post-menopausal  Other Topics Concern   Not on file  Social History Narrative   Not on file   Social Determinants of Health   Financial Resource Strain: Not on file  Food Insecurity: Not on file  Transportation Needs: Not on file  Physical Activity: Not on file  Stress: Not on file  Social Connections: Not on file     Family History: The patient's family history includes Brain cancer in her maternal grandfather; Colon polyps in her father; Diabetes in her mother; Heart disease in her father; Hyperlipidemia in her father; Hypertension in her father; Liver disease in her mother; Obesity in her father and mother; Pancreatic cancer in her paternal grandmother; Prostate cancer in her brother; Stroke in her father; Thyroid disease in her mother. There is no history of Colon cancer.  ROS:   Please see the history of present  illness.     All other systems reviewed and are negative.  EKGs/Labs/Other Studies Reviewed:    The following studies were reviewed today:   EKG:  EKG is  ordered today.  The ekg ordered today demonstrates   09/17/2022: NSR  Recent Labs: 01/22/2022: TSH 2.758 06/13/2022: ALT 12; BUN 16; Creatinine 1.28; Hemoglobin 13.1; Platelet Count 147; Potassium 3.9; Sodium 139   Recent Lipid Panel    Component Value Date/Time   CHOL 170 05/19/2018 0000   CHOL 163 11/06/2017 0850   TRIG 119 05/19/2018 0000   HDL 45 05/19/2018 0000   HDL 36 (L) 11/06/2017 0850   LDLCALC 101  05/19/2018 0000   LDLCALC 101 (H) 11/06/2017 0850     Risk Assessment/Calculations:     Physical Exam:    VS:  Vitals:   09/19/22 1525  BP: 136/85  Pulse: 76  SpO2: 97%      LMP 01/21/2018     Wt Readings from Last 3 Encounters:  09/13/22 286 lb (129.7 kg)  06/13/22 277 lb 6.4 oz (125.8 kg)  05/09/22 270 lb 14.4 oz (122.9 kg)     GEN:  Well nourished, well developed in no acute distress HEENT: Normal NECK: No JVD; No carotid bruits LYMPHATICS: No lymphadenopathy CARDIAC: RRR, no murmurs, rubs, gallops RESPIRATORY:  Clear to auscultation without rales, wheezing or rhonchi  ABDOMEN: Soft, non-tender, non-distended MUSCULOSKELETAL:  No edema; No deformity  SKIN: Warm and dry NEUROLOGIC:  Alert and oriented x 3 PSYCHIATRIC:  Normal affect   ASSESSMENT:     Cardio-Oncology - Herceptin- ?noted in the notes, don't see - Radiation L sided- Gy (50+50+10); > 25 Gy of the MHD increases radiation induced cardiotoxicity (eg fibrotic valve disease); MHD unknown and recommend cardiac sparing techniques (eg inspiration breath hold) - ICI- pembrolizumab: 02/10/2021-06/17/2021; surveillance current recommendation includes baseline TTE and ecg which each cycle for higher risk individuals. - AC: July and August 2022- total dose 60+50x3= 210 mg/m2 (risk for cardiotoxicity with > 250 mg/m2); if requires more dosing  in the future can discuss dexrozoxane   Her CVD risk is low, CAC 0, has had normal echoes. XRT induced dx is very unlikely with cardiac sparing techniques and recent timing. She has been exposed to ICI, no other effects of this agent have been identified. ECG today is normal today. Low suspicion for myocarditis. Will obtain a TTE, BNP, troponin,  to assess for possible ICI induced etiology for her SOB . Otherwise if normal, can be deconditioning.  HTN: good control. Continue losartan 100 mg daily   PLAN:    In order of problems listed above:  BNP Troponin  TTE Follow up 3 months           Medication Adjustments/Labs and Tests Ordered: Current medicines are reviewed at length with the patient today.  Concerns regarding medicines are outlined above.  No orders of the defined types were placed in this encounter.  No orders of the defined types were placed in this encounter.   There are no Patient Instructions on file for this visit.   Signed, Janina Mayo, MD  09/19/2022 8:03 AM    [place]

## 2022-09-25 ENCOUNTER — Telehealth: Payer: Self-pay

## 2022-09-25 ENCOUNTER — Ambulatory Visit (HOSPITAL_BASED_OUTPATIENT_CLINIC_OR_DEPARTMENT_OTHER)
Admission: RE | Admit: 2022-09-25 | Discharge: 2022-09-25 | Disposition: A | Discharge status: Home / Self Care | Payer: 59 | Source: Ambulatory Visit | Attending: Adult Health | Admitting: Adult Health

## 2022-09-25 DIAGNOSIS — Z780 Asymptomatic menopausal state: Secondary | ICD-10-CM | POA: Diagnosis not present

## 2022-09-25 DIAGNOSIS — R0600 Dyspnea, unspecified: Secondary | ICD-10-CM | POA: Diagnosis not present

## 2022-09-25 DIAGNOSIS — E2839 Other primary ovarian failure: Secondary | ICD-10-CM | POA: Diagnosis not present

## 2022-09-25 NOTE — Telephone Encounter (Signed)
Called pt to review results. She verbalized thanks and understanding.

## 2022-09-25 NOTE — Telephone Encounter (Signed)
-----   Message from Gardenia Phlegm, NP sent at 09/25/2022 10:25 AM EST ----- Regarding: FW: Normal bone density-please share with patient ----- Message ----- From: Interface, Rad Results In Sent: 09/25/2022   9:54 AM EST To: Gardenia Phlegm, NP

## 2022-10-10 ENCOUNTER — Other Ambulatory Visit (HOSPITAL_COMMUNITY): Payer: Self-pay

## 2022-10-23 ENCOUNTER — Ambulatory Visit (HOSPITAL_COMMUNITY): Payer: Commercial Managed Care - PPO | Attending: Internal Medicine

## 2022-10-23 ENCOUNTER — Other Ambulatory Visit (HOSPITAL_COMMUNITY): Payer: Commercial Managed Care - PPO

## 2022-10-23 DIAGNOSIS — R0600 Dyspnea, unspecified: Secondary | ICD-10-CM

## 2022-10-23 DIAGNOSIS — R0609 Other forms of dyspnea: Secondary | ICD-10-CM

## 2022-10-24 LAB — ECHOCARDIOGRAM COMPLETE
Area-P 1/2: 3.99 cm2
S' Lateral: 3 cm

## 2022-10-31 ENCOUNTER — Other Ambulatory Visit (HOSPITAL_COMMUNITY): Payer: Self-pay

## 2022-10-31 ENCOUNTER — Other Ambulatory Visit: Payer: Self-pay

## 2022-10-31 ENCOUNTER — Encounter (HOSPITAL_COMMUNITY): Payer: Self-pay

## 2022-10-31 DIAGNOSIS — L570 Actinic keratosis: Secondary | ICD-10-CM | POA: Diagnosis not present

## 2022-10-31 DIAGNOSIS — L304 Erythema intertrigo: Secondary | ICD-10-CM | POA: Diagnosis not present

## 2022-10-31 DIAGNOSIS — L400 Psoriasis vulgaris: Secondary | ICD-10-CM | POA: Diagnosis not present

## 2022-10-31 DIAGNOSIS — L718 Other rosacea: Secondary | ICD-10-CM | POA: Diagnosis not present

## 2022-10-31 DIAGNOSIS — D225 Melanocytic nevi of trunk: Secondary | ICD-10-CM | POA: Diagnosis not present

## 2022-10-31 DIAGNOSIS — L814 Other melanin hyperpigmentation: Secondary | ICD-10-CM | POA: Diagnosis not present

## 2022-10-31 MED ORDER — DOXYCYCLINE HYCLATE 50 MG PO CAPS
50.0000 mg | ORAL_CAPSULE | Freq: Every day | ORAL | 0 refills | Status: AC
  Filled 2022-10-31: qty 90, 90d supply, fill #0

## 2022-10-31 MED ORDER — VTAMA 1 % EX CREA
TOPICAL_CREAM | CUTANEOUS | 11 refills | Status: DC
  Filled 2022-10-31 – 2023-06-11 (×2): qty 60, 30d supply, fill #0

## 2022-10-31 MED ORDER — METRONIDAZOLE 0.75 % EX CREA
TOPICAL_CREAM | CUTANEOUS | 3 refills | Status: DC
  Filled 2022-10-31: qty 45, 30d supply, fill #0
  Filled 2023-06-19: qty 45, 30d supply, fill #1
  Filled 2023-08-28: qty 45, 30d supply, fill #2

## 2022-11-01 ENCOUNTER — Other Ambulatory Visit (HOSPITAL_COMMUNITY): Payer: Self-pay

## 2022-11-02 ENCOUNTER — Other Ambulatory Visit: Payer: Self-pay

## 2022-11-02 DIAGNOSIS — C50511 Malignant neoplasm of lower-outer quadrant of right female breast: Secondary | ICD-10-CM

## 2022-11-02 NOTE — Progress Notes (Signed)
Per md orders entered for signatera. Requisition and all supported documents faxed to [phone]. Faxed confirmation was received.  

## 2022-11-07 ENCOUNTER — Other Ambulatory Visit (HOSPITAL_COMMUNITY): Payer: Self-pay

## 2022-11-07 MED ORDER — TRIAMCINOLONE ACETONIDE 0.1 % EX LOTN
1.0000 | TOPICAL_LOTION | Freq: Every day | CUTANEOUS | 0 refills | Status: DC
  Filled 2022-11-07: qty 60, 30d supply, fill #0

## 2022-11-07 MED ORDER — CALCIPOTRIENE 0.005 % EX SOLN
1.0000 | Freq: Every day | CUTANEOUS | 0 refills | Status: DC
  Filled 2022-11-07: qty 60, 30d supply, fill #0

## 2022-11-08 ENCOUNTER — Other Ambulatory Visit: Payer: Self-pay

## 2022-11-09 ENCOUNTER — Other Ambulatory Visit (HOSPITAL_COMMUNITY): Payer: Self-pay

## 2022-11-13 ENCOUNTER — Other Ambulatory Visit: Payer: Self-pay

## 2022-11-23 ENCOUNTER — Other Ambulatory Visit: Payer: Self-pay

## 2022-11-24 ENCOUNTER — Other Ambulatory Visit (HOSPITAL_COMMUNITY): Payer: Self-pay

## 2022-11-27 ENCOUNTER — Other Ambulatory Visit: Payer: Self-pay

## 2022-11-27 ENCOUNTER — Other Ambulatory Visit (HOSPITAL_COMMUNITY): Payer: Self-pay

## 2022-11-27 ENCOUNTER — Telehealth: Payer: Self-pay

## 2022-11-27 NOTE — Telephone Encounter (Signed)
Received email from [facility] stating they have not received blood sample on pt. Contacted pt to find out if she was still interested in signatera testing. She states she is and that she received the kit Friday. She will call today to set up home lab draw.

## 2022-12-05 DIAGNOSIS — Z170 Estrogen receptor positive status [ER+]: Secondary | ICD-10-CM | POA: Diagnosis not present

## 2022-12-05 DIAGNOSIS — C50511 Malignant neoplasm of lower-outer quadrant of right female breast: Secondary | ICD-10-CM | POA: Diagnosis not present

## 2022-12-11 ENCOUNTER — Ambulatory Visit: Payer: Commercial Managed Care - PPO | Attending: Internal Medicine | Admitting: Internal Medicine

## 2022-12-11 ENCOUNTER — Encounter: Payer: Self-pay | Admitting: Internal Medicine

## 2022-12-11 VITALS — BP 128/86 | HR 104 | Ht 67.0 in | Wt 290.0 lb

## 2022-12-11 DIAGNOSIS — R0600 Dyspnea, unspecified: Secondary | ICD-10-CM

## 2022-12-11 NOTE — Progress Notes (Signed)
Cardiology Office Note:    Date:  12/11/2022   ID:  Leslie Wright, DOB 18-Aug-1966, MRN [id number]  PCP:  Fanny Bien, MD   [place] Providers Cardiologist:  None     Referring MD: Fanny Bien, MD   No chief complaint on file. Cardiotoxic Chemo/SOB  History of Present Illness:    Leslie Wright is a 57 y.o. female with a hx of DCT, GERD, HLD, sleep apnea , difficulty with CPAP, R breast cancer, diagnosed 01/20/2021, ER+, HER2-, managed with pembrolizumab, s/p BL mastectomies 08/08/2021, R XRT, residual L breast ductal carcinoma (50+50+10) 1/52023-11/27/2021, on xeloda 12/12/2021 x6 months, on anastrozole; she is in survivorship clinic, referral from [facility] for shortness of breath. She is cancer free. She notes SOB during therapy. She notes 3 weeks she notes some SOB. She was walking with one of her friends.  It was less than a mile  No orthopnea/PND. Some ankle/feet swelling. She went to [facility] and her feet were swollen. She sits all day for medical billing. She typically walks with her dogs. She tried compression stockings, they helped.  No cardiac dx history  Social Hx: no long-term smoking  Family Hx: Father had an MI, CVA. Lung CA. MGF had Mis.  Uncle on her father's side that had MI at 70. Cousin had MI at 59.  Interim Hx 12/11/2022 BP well controlled today. She is feeling well. Notes still has SOB and thinks related to deconditioning. 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 She enjoys this.   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 (not on CT scan; no further imaging) TTE 10/23/2022-normal EF, normal GLS, RV mild reduction, RA pressure 8  Past Medical History:  Diagnosis Date   Acid reflux    Anemia    Arthritis    "maybe in my fingers" (03/26/2018)    Breast cancer ([place]) 12/2020   both sides   Concussion 2001   no deficit had tia right after no problems since   Family history of adverse reaction to anesthesia    "brother, Timmy, and my mom always get sick" (03/26/2018)   Family history of brain cancer    Family history of breast cancer    Family history of colon cancer    Family history of lung cancer    Family history of pancreatic cancer    Family history of prostate cancer    Headache    High blood pressure    High cholesterol    Hip pain    "left; before OR" (03/26/2018)   History of chemotherapy last done 02-17-2021   History of hiatal hernia    History of kidney stones    OSA (obstructive sleep apnea)    "can't tolerate the mask" (03/26/2018)    PONV (postoperative nausea and vomiting)    does not need much anesthesia, slow to awlaekn in past   Pre-diabetes    Shortness of breath    with exertion   TIA (transient ischemic attack)    15 years ago   Wears partial dentures upper    Past Surgical History:  Procedure Laterality Date   CARPAL TUNNEL RELEASE Right ~ 2006   JOINT REPLACEMENT Left    total left hip   LAPAROSCOPIC CHOLECYSTECTOMY  12/2010  LASIK Bilateral 2000   PORT A CATH REVISION Left 02/21/2021   Procedure: PORT A CATH REVISION;  Surgeon: Stark Klein, MD;  Location: [place];  Service: General;  Laterality: Left;  60 MINUTES ROOM 5   PORT-A-CATH REMOVAL Left 10/11/2021   Procedure: PORT REMOVAL;  Surgeon: Stark Klein, MD;  Location: [place];  Service: General;  Laterality: Left;   PORTACATH PLACEMENT N/A 02/08/2021   Procedure: INSERTION PORT-A-CATH;  Surgeon: Stark Klein, MD;  [facility];  Service: General;  Laterality: N/A;   RADIOACTIVE SEED GUIDED AXILLARY SENTINEL LYMPH NODE Right 08/08/2021   Procedure: RADIOACTIVE SEED GUIDED RIGHT AXILLARY SENTINEL LYMPH NODE BIOPSY;  Surgeon: Stark Klein, MD;  Location: [place];  Service: General;  Laterality: Right;    SENTINEL NODE BIOPSY Bilateral 08/08/2021   Procedure: BILATERAL SENTINEL NODE BIOPSY;  Surgeon: Stark Klein, MD;  Location: [place];  Service: General;  Laterality: Bilateral;   TOTAL HIP ARTHROPLASTY Left 03/25/2018   Procedure: LEFT TOTAL HIP ARTHROPLASTY ANTERIOR APPROACH;  Surgeon: Mcarthur Rossetti, MD;  Location: [place];  Service: Orthopedics;  Laterality: Left;   TOTAL MASTECTOMY Bilateral 08/08/2021   Procedure: BILATERAL TOTAL MASTECTOMY;  Surgeon: Stark Klein, MD;  Location: [place];  Service: General;  Laterality: Bilateral;    Current Medications: No outpatient medications have been marked as taking for the 12/11/22 encounter (Appointment) with Janina Mayo, MD.     Allergies:   Patient has no known allergies.   Social History   Socioeconomic History   Marital status: Single    Spouse name: Not on file   Number of children: Not on file   Years of education: Not on file   Highest education level: Not on file  Occupational History   Occupation: Orthoptist    Employer: [employer]  Tobacco Use   Smoking status: Former    Types: Cigarettes   Smokeless tobacco: Never   Tobacco comments:    In teens social  Vaping Use   Vaping Use: Never used  Substance and Sexual Activity   Alcohol use: Yes    Comment: Occasional   Drug use: Never   Sexual activity: Not Currently    Birth control/protection: Post-menopausal  Other Topics Concern   Not on file  Social History Narrative   Not on file   Social Determinants of Health   Financial Resource Strain: Not on file  Food Insecurity: Not on file  Transportation Needs: Not on file  Physical Activity: Not on file  Stress: Not on file  Social Connections: Not on file     Family History: The patient's family history includes Brain cancer in her maternal grandfather; Colon polyps in her father; Diabetes in her mother; Heart disease in her father; Hyperlipidemia in her father; Hypertension in her father; Liver disease  in her mother; Obesity in her father and mother; Pancreatic cancer in her paternal grandmother; Prostate cancer in her brother; Stroke in her father; Thyroid disease in her mother. There is no history of Colon cancer.  ROS:   Please see the history of present illness.     All other systems reviewed and are negative.  EKGs/Labs/Other Studies Reviewed:    The following studies were reviewed today:   EKG:  EKG is  ordered today.  The ekg ordered today demonstrates   09/17/2022: NSR  Recent Labs: 01/22/2022: TSH 2.758 06/13/2022: ALT 12; BUN 16; Creatinine 1.28; Hemoglobin 13.1; Platelet Count 147; Potassium 3.9; Sodium 139 09/19/2022: BNP  14.5   Recent Lipid Panel    Component Value Date/Time   CHOL 170 05/19/2018 0000   CHOL 163 11/06/2017 0850   TRIG 119 05/19/2018 0000   HDL 45 05/19/2018 0000   HDL 36 (L) 11/06/2017 0850   LDLCALC 101 05/19/2018 0000   LDLCALC 101 (H) 11/06/2017 0850     Risk Assessment/Calculations:     Physical Exam:    VS:  There were no vitals filed for this visit.     LMP 01/21/2018     Wt Readings from Last 3 Encounters:  09/19/22 286 lb (129.7 kg)  09/13/22 286 lb (129.7 kg)  06/13/22 277 lb 6.4 oz (125.8 kg)     GEN:  Well nourished, well developed in no acute distress HEENT: Normal NECK: No JVD; No carotid bruits LYMPHATICS: No lymphadenopathy CARDIAC: RRR, no murmurs, rubs, gallops RESPIRATORY:  Clear to auscultation without rales, wheezing or rhonchi  ABDOMEN: Soft, non-tender, non-distended MUSCULOSKELETAL:  No edema; No deformity  SKIN: Warm and dry NEUROLOGIC:  Alert and oriented x 3 PSYCHIATRIC:  Normal affect   ASSESSMENT:     Cardio-Oncology - Herceptin- ?noted in the notes, don't see - Radiation L sided- Gy (50+50+10); > 25 Gy of the MHD increases radiation induced cardiotoxicity (eg fibrotic valve disease); MHD unknown and recommend cardiac sparing techniques (eg inspiration breath hold) - ICI- pembrolizumab:  02/10/2021-06/17/2021; surveillance current recommendation includes baseline TTE and ecg which each cycle for higher risk individuals. - AC: July and August 2022- total dose 60+50x3= 210 mg/m2 (risk for cardiotoxicity with > 250 mg/m2); if requires more dosing in the future can discuss dexrozoxane  Her CVD risk is low, CAC 0, has had normal echoes. XRT induced dx is very unlikely with cardiac sparing techniques and recent timing. She has been exposed to ICI, no other effects of this agent have been identified. ECG today is normal today. She was having SOB in December, echo showed normal LV function , no signifcant diastolic dysfunction [id number]. RV is not dilated, only minor reductoin. No pulmonary HTN. BNP was normal. We discussed likely in the setting of deconditioning. Goal is to increase her physical activity with her young puppy.  HTN: good control. Continue losartan 100 mg daily   PLAN:    In order of problems listed above:  Follow up 12 months     Medication Adjustments/Labs and Tests Ordered: Current medicines are reviewed at length with the patient today.  Concerns regarding medicines are outlined above.  No orders of the defined types were placed in this encounter.  No orders of the defined types were placed in this encounter.   There are no Patient Instructions on file for this visit.   Signed, Janina Mayo, MD  12/11/2022 11:12 AM    [place]

## 2022-12-11 NOTE — Patient Instructions (Signed)
Medication Instructions:  No Changes In Medications at this time.  *If you need a refill on your cardiac medications before your next appointment, please call your pharmacy*  Lab Work: None Ordered At This Time.  If you have labs (blood work) drawn today and your tests are completely normal, you will receive your results only by: [place] (if you have MyChart) OR A paper copy in the mail If you have any lab test that is abnormal or we need to change your treatment, we will call you to review the results.  Testing/Procedures: None Ordered At This Time.   Follow-Up: At [facility], you and your health needs are our priority.  As part of our continuing mission to provide you with exceptional heart care, we have created designated Provider Care Teams.  These Care Teams include your primary Cardiologist (physician) and Advanced Practice Providers (APPs -  Physician Assistants and Nurse Practitioners) who all work together to provide you with the care you need, when you need it.  Your next appointment:   1 year(s)  Provider:   Dr.  Harl Bowie

## 2022-12-19 DIAGNOSIS — E1165 Type 2 diabetes mellitus with hyperglycemia: Secondary | ICD-10-CM | POA: Diagnosis not present

## 2022-12-19 DIAGNOSIS — I10 Essential (primary) hypertension: Secondary | ICD-10-CM | POA: Diagnosis not present

## 2022-12-26 DIAGNOSIS — Z170 Estrogen receptor positive status [ER+]: Secondary | ICD-10-CM | POA: Diagnosis not present

## 2022-12-26 DIAGNOSIS — C50511 Malignant neoplasm of lower-outer quadrant of right female breast: Secondary | ICD-10-CM | POA: Diagnosis not present

## 2023-01-02 DIAGNOSIS — E1159 Type 2 diabetes mellitus with other circulatory complications: Secondary | ICD-10-CM | POA: Diagnosis not present

## 2023-01-02 DIAGNOSIS — E1169 Type 2 diabetes mellitus with other specified complication: Secondary | ICD-10-CM | POA: Diagnosis not present

## 2023-01-02 DIAGNOSIS — I152 Hypertension secondary to endocrine disorders: Secondary | ICD-10-CM | POA: Diagnosis not present

## 2023-01-02 DIAGNOSIS — E1165 Type 2 diabetes mellitus with hyperglycemia: Secondary | ICD-10-CM | POA: Diagnosis not present

## 2023-01-02 DIAGNOSIS — E782 Mixed hyperlipidemia: Secondary | ICD-10-CM | POA: Diagnosis not present

## 2023-01-03 ENCOUNTER — Other Ambulatory Visit (HOSPITAL_COMMUNITY): Payer: Self-pay

## 2023-01-03 MED ORDER — MOUNJARO 2.5 MG/0.5ML ~~LOC~~ SOAJ
2.5000 mg | SUBCUTANEOUS | 0 refills | Status: DC
  Filled 2023-01-03 – 2023-01-22 (×3): qty 2, 28d supply, fill #0

## 2023-01-04 ENCOUNTER — Other Ambulatory Visit (HOSPITAL_COMMUNITY): Payer: Self-pay

## 2023-01-04 ENCOUNTER — Other Ambulatory Visit: Payer: Self-pay

## 2023-01-04 ENCOUNTER — Encounter: Payer: Self-pay | Admitting: Pharmacist

## 2023-01-15 ENCOUNTER — Inpatient Hospital Stay: Payer: Commercial Managed Care - PPO | Attending: Adult Health | Admitting: Adult Health

## 2023-01-15 ENCOUNTER — Inpatient Hospital Stay: Payer: Commercial Managed Care - PPO

## 2023-01-15 ENCOUNTER — Encounter: Payer: Self-pay | Admitting: Adult Health

## 2023-01-15 VITALS — BP 137/96 | HR 96 | Temp 97.5°F | Resp 18 | Ht 67.0 in | Wt 294.2 lb

## 2023-01-15 DIAGNOSIS — R5383 Other fatigue: Secondary | ICD-10-CM | POA: Diagnosis not present

## 2023-01-15 DIAGNOSIS — E559 Vitamin D deficiency, unspecified: Secondary | ICD-10-CM | POA: Diagnosis not present

## 2023-01-15 DIAGNOSIS — C50812 Malignant neoplasm of overlapping sites of left female breast: Secondary | ICD-10-CM | POA: Diagnosis not present

## 2023-01-15 DIAGNOSIS — Z170 Estrogen receptor positive status [ER+]: Secondary | ICD-10-CM | POA: Diagnosis not present

## 2023-01-15 DIAGNOSIS — Z87891 Personal history of nicotine dependence: Secondary | ICD-10-CM | POA: Diagnosis not present

## 2023-01-15 DIAGNOSIS — Z171 Estrogen receptor negative status [ER-]: Secondary | ICD-10-CM | POA: Insufficient documentation

## 2023-01-15 DIAGNOSIS — C50511 Malignant neoplasm of lower-outer quadrant of right female breast: Secondary | ICD-10-CM | POA: Insufficient documentation

## 2023-01-15 DIAGNOSIS — Z79811 Long term (current) use of aromatase inhibitors: Secondary | ICD-10-CM | POA: Diagnosis not present

## 2023-01-15 DIAGNOSIS — Z9013 Acquired absence of bilateral breasts and nipples: Secondary | ICD-10-CM | POA: Insufficient documentation

## 2023-01-15 LAB — CBC WITH DIFFERENTIAL (CANCER CENTER ONLY)
Abs Immature Granulocytes: 0.04 10*3/uL (ref 0.00–0.07)
Basophils Absolute: 0 10*3/uL (ref 0.0–0.1)
Basophils Relative: 0 %
Eosinophils Absolute: 0.2 10*3/uL (ref 0.0–0.5)
Eosinophils Relative: 3 %
HCT: 37.8 % (ref 36.0–46.0)
Hemoglobin: 12.8 g/dL (ref 12.0–15.0)
Immature Granulocytes: 1 %
Lymphocytes Relative: 20 %
Lymphs Abs: 1.4 10*3/uL (ref 0.7–4.0)
MCH: 30.5 pg (ref 26.0–34.0)
MCHC: 33.9 g/dL (ref 30.0–36.0)
MCV: 90.2 fL (ref 80.0–100.0)
Monocytes Absolute: 0.5 10*3/uL (ref 0.1–1.0)
Monocytes Relative: 8 %
Neutro Abs: 4.7 10*3/uL (ref 1.7–7.7)
Neutrophils Relative %: 68 %
Platelet Count: 196 10*3/uL (ref 150–400)
RBC: 4.19 MIL/uL (ref 3.87–5.11)
RDW: 14.6 % (ref 11.5–15.5)
WBC Count: 6.9 10*3/uL (ref 4.0–10.5)
nRBC: 0 % (ref 0.0–0.2)

## 2023-01-15 LAB — VITAMIN B12: Vitamin B-12: 258 pg/mL (ref 180–914)

## 2023-01-15 LAB — VITAMIN D 25 HYDROXY (VIT D DEFICIENCY, FRACTURES): Vit D, 25-Hydroxy: 31.32 ng/mL (ref 30–100)

## 2023-01-15 NOTE — Progress Notes (Signed)
[facility] Cancer Follow up:    [name], MD [address] [place] [address]   DIAGNOSIS: Cancer Staging  Malignant neoplasm of lower-outer quadrant of right breast of female, estrogen receptor positive Staging form: Breast, AJCC 8th Edition - Clinical stage from 01/20/2021: Stage IA (cT1c, cN0, cM0, G2, ER+, PR-, HER2-) - Signed by [name], MD on 01/25/2021 Stage prefix: Initial diagnosis Histologic grading system: 3 grade system  Malignant neoplasm of overlapping sites of left breast in female, estrogen receptor negative Staging form: Breast, AJCC 8th Edition - Clinical stage from 01/20/2021: Stage IIIB (cT3, cN0, cM0, G3, ER-, PR-, HER2-) - Signed by [name], MD on 01/25/2021 Histologic grading system: 3 grade system   SUMMARY OF ONCOLOGIC HISTORY: Oncology History  Malignant neoplasm of lower-outer quadrant of right breast of female, estrogen receptor positive  01/20/2021 Cancer Staging   Staging form: Breast, AJCC 8th Edition - Clinical stage from 01/20/2021: Stage IA (cT1c, cN0, cM0, G2, ER+, PR-, HER2-) - Signed by [name], MD on 01/25/2021 Stage prefix: Initial diagnosis Histologic grading system: 3 grade system   01/23/2021 Initial Diagnosis   Malignant neoplasm of lower-outer quadrant of right breast of female, estrogen receptor positive (HCC)   02/10/2021 - 06/17/2021 Chemotherapy   Patient is on Treatment Plan : BREAST Pembrolizumab + Carboplatin D1 + Paclitaxel D1,8,15 q21d X 4 cycles / Pembrolizumab + AC q21d x 4 cycles      Genetic Testing   Negative genetic testing. No pathogenic variants identified on the [facility] Multi-Cancer Panel+RNA. VUS in ATM called c.1132A>G identified. The report date is 02/13/2021.  The Multi-Cancer Panel + RNA offered by [facility] includes sequencing and/or deletion duplication testing of the following 84 genes: AIP, ALK, APC, ATM, AXIN2,BAP1,  BARD1, BLM, BMPR1A, BRCA1, BRCA2, BRIP1,  CASR, CDC73, CDH1, CDK4, CDKN1B, CDKN1C, CDKN2A (p14ARF), CDKN2A (p16INK4a), CEBPA, CHEK2, CTNNA1, DICER1, DIS3L2, EGFR (c.2369C>T, p.Thr790Met variant only), EPCAM (Deletion/duplication testing only), FH, FLCN, GATA2, GPC3, GREM1 (Promoter region deletion/duplication testing only), HOXB13 (c.251G>A, p.Gly84Glu), HRAS, KIT, MAX, MEN1, MET, MITF (c.952G>A, p.Glu318Lys variant only), MLH1, MSH2, MSH3, MSH6, MUTYH, NBN, NF1, NF2, NTHL1, PALB2, PDGFRA, PHOX2B, PMS2, POLD1, POLE, POT1, PRKAR1A, PTCH1, PTEN, RAD50, RAD51C, RAD51D, RB1, RECQL4, RET, RUNX1, SDHAF2, SDHA (sequence changes only), SDHB, SDHC, SDHD, SMAD4, SMARCA4, SMARCB1, SMARCE1, STK11, SUFU, TERC, TERT, TMEM127, TP53, TSC1, TSC2, VHL, WRN and WT1.   08/08/2021 Surgery   She proceeded to bilateral mastectomies on 08/08/2021 under Dr. Donell Beers. Pathology from the procedure [id number]) showed: 1. BREAST, RIGHT, MASTECTOMY:  -  Focal residual invasive ductal carcinoma, status post neoadjuvant  therapy  -  Extensive sclerosing adenosis with associated calcifications  -  Sclerotic intraductal papilloma  -  Previous biopsy site changes present  -  See oncology table and comment below  2. LYMPH NODE, RIGHT AXILLARY, SENTINEL, EXCISION:  -  No carcinoma identified (0/2)  3. BREAST, LEFT, MASTECTOMY:  -  Residual multifocal invasive ductal carcinoma, status post  neoadjuvant therapy  -  Ductal carcinoma in-situ, high grade  -  Calcifications associated with carcinoma  -  Margins uninvolved by carcinoma (0.2 cm; anterior margin)  -  No carcinoma identified in one lymph node (0/1)  -  Lymphovascular space invasion present  -  Previous biopsy site changes present  -  See oncology table below  4. LYMPH NODE, LEFT AXILLARY, SENTINEL, EXCISION:  -  No carcinoma identified (0/4)     10/05/2021 - 11/27/2021 Radiation Therapy  10/05/2021 through 11/27/2021 Site Technique Total Dose (Gy) Dose per Fx (Gy) Completed Fx Beam Energies  Chest Wall, Left:  CW_L 3D 50.4/50.4 1.8 28/28 10X  Chest Wall, Left: CW_L_SCLV 3D 50.4/50.4 1.8 28/28 6X, 10X  Chest Wall, Left: CW_L_Bst specialPort 10/10 2 5/5 9E    12/12/2021 -  Chemotherapy   Xeloda 500mg , 2 tablets twice a day 14 days on, 7 days off x 6 months   Malignant neoplasm of overlapping sites of left breast in female, estrogen receptor negative  01/20/2021 Cancer Staging   Staging form: Breast, AJCC 8th Edition - Clinical stage from 01/20/2021: Stage IIIB (cT3, cN0, cM0, G3, ER-, PR-, HER2-) - Signed by [name], MD on 01/25/2021 Histologic grading system: 3 grade system   02/10/2021 - 06/17/2021 Chemotherapy   Patient is on Treatment Plan : BREAST Pembrolizumab + Carboplatin D1 + Paclitaxel D1,8,15 q21d X 4 cycles / Pembrolizumab + AC q21d x 4 cycles      Genetic Testing   Negative genetic testing. No pathogenic variants identified on the [facility] Multi-Cancer Panel+RNA. VUS in ATM called c.1132A>G identified. The report date is 02/13/2021.  The Multi-Cancer Panel + RNA offered by [facility] includes sequencing and/or deletion duplication testing of the following 84 genes: AIP, ALK, APC, ATM, AXIN2,BAP1,  BARD1, BLM, BMPR1A, BRCA1, BRCA2, BRIP1, CASR, CDC73, CDH1, CDK4, CDKN1B, CDKN1C, CDKN2A (p14ARF), CDKN2A (p16INK4a), CEBPA, CHEK2, CTNNA1, DICER1, DIS3L2, EGFR (c.2369C>T, p.Thr790Met variant only), EPCAM (Deletion/duplication testing only), FH, FLCN, GATA2, GPC3, GREM1 (Promoter region deletion/duplication testing only), HOXB13 (c.251G>A, p.Gly84Glu), HRAS, KIT, MAX, MEN1, MET, MITF (c.952G>A, p.Glu318Lys variant only), MLH1, MSH2, MSH3, MSH6, MUTYH, NBN, NF1, NF2, NTHL1, PALB2, PDGFRA, PHOX2B, PMS2, POLD1, POLE, POT1, PRKAR1A, PTCH1, PTEN, RAD50, RAD51C, RAD51D, RB1, RECQL4, RET, RUNX1, SDHAF2, SDHA (sequence changes only), SDHB, SDHC, SDHD, SMAD4, SMARCA4, SMARCB1, SMARCE1, STK11, SUFU, TERC, TERT, TMEM127, TP53, TSC1, TSC2, VHL, WRN and WT1.   07/28/2021 - 09/07/2021 Chemotherapy   Patient is  on Treatment Plan : HEAD/NECK Pembrolizumab Q21D     08/08/2021 Surgery   She proceeded to bilateral mastectomies on 08/08/2021 under Dr. Donell Beers. Pathology from the procedure [phone]) showed: 1. BREAST, RIGHT, MASTECTOMY:  -  Focal residual invasive ductal carcinoma, status post neoadjuvant  therapy  -  Extensive sclerosing adenosis with associated calcifications  -  Sclerotic intraductal papilloma  -  Previous biopsy site changes present  -  See oncology table and comment below  2. LYMPH NODE, RIGHT AXILLARY, SENTINEL, EXCISION:  -  No carcinoma identified (0/2)  3. BREAST, LEFT, MASTECTOMY:  -  Residual multifocal invasive ductal carcinoma, status post  neoadjuvant therapy  -  Ductal carcinoma in-situ, high grade  -  Calcifications associated with carcinoma  -  Margins uninvolved by carcinoma (0.2 cm; anterior margin)  -  No carcinoma identified in one lymph node (0/1)  -  Lymphovascular space invasion present  -  Previous biopsy site changes present  -  See oncology table below  4. LYMPH NODE, LEFT AXILLARY, SENTINEL, EXCISION:  -  No carcinoma identified (0/4)     10/05/2021 - 11/27/2021 Radiation Therapy   10/05/2021 through 11/27/2021 Site Technique Total Dose (Gy) Dose per Fx (Gy) Completed Fx Beam Energies  Chest Wall, Left: CW_L 3D 50.4/50.4 1.8 28/28 10X  Chest Wall, Left: CW_L_SCLV 3D 50.4/50.4 1.8 28/28 6X, 10X  Chest Wall, Left: CW_L_Bst specialPort 10/10 2 5/5 9E    12/12/2021 -  Chemotherapy   Xeloda 500mg , 2 tablets twice a day 14 days on, 7  days off x 6 months     CURRENT THERAPY: Anastrozole  INTERVAL HISTORY: Leslie Wright 57 y.o. female returns for follow-up of her history of breast cancer.  Since her last visit she underwent bone density testing on September 25, 2022 that was normal.  She also had Signatera testing that was negative.  He continues on anastrozole daily with good tolerance.  At her last visit with me she was experiencing some shortness of  breath.  Chest x-ray was negative she was referred to cardiology and underwent normal lab testing and normal echocardiogram.  Met with Dr. Wyline Mood in March 2024 who attributed her shortness of breath to deconditioning.   Patient Active Problem List   Diagnosis Date Noted  . Bilateral breast cancer 08/08/2021  . CKD (chronic kidney disease) stage 3, GFR 30-59 ml/min 04/21/2021  . Port-A-Cath in place 03/09/2021  . Encounter for antineoplastic chemotherapy 02/17/2021  . Encounter for immunotherapy 02/17/2021  . Genetic testing 02/14/2021  . Hepatic steatosis 02/09/2021  . Multilevel degenerative disc disease 02/09/2021  . Family history of breast cancer   . Family history of pancreatic cancer   . Family history of colon cancer   . Family history of lung cancer   . Family history of prostate cancer   . Family history of brain cancer   . Malignant neoplasm of overlapping sites of left breast in female, estrogen receptor negative 01/25/2021  . Malignant neoplasm of lower-outer quadrant of right breast of female, estrogen receptor positive 01/23/2021  . Status post left hip replacement 05/19/2018  . Acute deep vein thrombosis (DVT) of distal end of right lower extremity 05/19/2018  . Unilateral primary osteoarthritis, left hip 03/25/2018  . Status post total replacement of left hip 03/25/2018  . Hyperlipidemia 04/11/2017  . Class 3 obesity with serious comorbidity and body mass index (BMI) of 40.0 to 44.9 in adult 04/11/2017  . Prediabetes 02/04/2017  . Mixed hyperlipidemia 01/09/2017  . Essential hypertension 10/25/2016  . Morbid obesity with BMI of 40.0-44.9, adult 10/25/2016  . Vitamin D deficiency 10/25/2016    has No Known Allergies.  MEDICAL HISTORY: Past Medical History:  Diagnosis Date  . Acid reflux   . Anemia   . Arthritis    "maybe in my fingers" (03/26/2018)  . Breast cancer (HCC) 12/2020   both sides  . Concussion 2001   no deficit had tia right after no problems  since  . Family history of adverse reaction to anesthesia    "brother, Lorne Skeens, and my mom always get sick" (03/26/2018)  . Family history of brain cancer   . Family history of breast cancer   . Family history of colon cancer   . Family history of lung cancer   . Family history of pancreatic cancer   . Family history of prostate cancer   . Headache   . High blood pressure   . High cholesterol   . Hip pain    "left; before OR" (03/26/2018)  . History of chemotherapy last done 02-17-2021  . History of hiatal hernia   . History of kidney stones   . OSA (obstructive sleep apnea)    "can't tolerate the mask" (03/26/2018)   . PONV (postoperative nausea and vomiting)    does not need much anesthesia, slow to awlaekn in past  . Pre-diabetes   . Shortness of breath    with exertion  . TIA (transient ischemic attack)    15 years ago  . Wears partial dentures upper  SURGICAL HISTORY: Past Surgical History:  Procedure Laterality Date  . CARPAL TUNNEL RELEASE Right ~ 2006  . JOINT REPLACEMENT Left    total left hip  . LAPAROSCOPIC CHOLECYSTECTOMY  12/2010  . LASIK Bilateral 2000  . PORT A CATH REVISION Left 02/21/2021   Procedure: PORT A CATH REVISION;  Surgeon: Almond Lint, MD;  Location: [facility];  Service: General;  Laterality: Left;  60 MINUTES ROOM 5  . PORT-A-CATH REMOVAL Left 10/11/2021   Procedure: PORT REMOVAL;  Surgeon: Almond Lint, MD;  Location: [facility];  Service: General;  Laterality: Left;  . PORTACATH PLACEMENT N/A 02/08/2021   Procedure: INSERTION PORT-A-CATH;  Surgeon: Almond Lint, MD;  Location: [facility];  Service: General;  Laterality: N/A;  . RADIOACTIVE SEED GUIDED AXILLARY SENTINEL LYMPH NODE Right 08/08/2021   Procedure: RADIOACTIVE SEED GUIDED RIGHT AXILLARY SENTINEL LYMPH NODE BIOPSY;  Surgeon: Almond Lint, MD;  Location: [facility];  Service: General;  Laterality: Right;  . SENTINEL NODE BIOPSY Bilateral 08/08/2021   Procedure:  BILATERAL SENTINEL NODE BIOPSY;  Surgeon: Almond Lint, MD;  Location: [facility];  Service: General;  Laterality: Bilateral;  . TOTAL HIP ARTHROPLASTY Left 03/25/2018   Procedure: LEFT TOTAL HIP ARTHROPLASTY ANTERIOR APPROACH;  Surgeon: Kathryne Hitch, MD;  Location: [facility];  Service: Orthopedics;  Laterality: Left;  . TOTAL MASTECTOMY Bilateral 08/08/2021   Procedure: BILATERAL TOTAL MASTECTOMY;  Surgeon: Almond Lint, MD;  Location: [facility];  Service: General;  Laterality: Bilateral;    SOCIAL HISTORY: Social History   Socioeconomic History  . Marital status: Single    Spouse name: Not on file  . Number of children: Not on file  . Years of education: Not on file  . Highest education level: Not on file  Occupational History  . Occupation: IT trainer: [employer]  Tobacco Use  . Smoking status: Former    Types: Cigarettes  . Smokeless tobacco: Never  . Tobacco comments:    In teens social  Vaping Use  . Vaping Use: Never used  Substance and Sexual Activity  . Alcohol use: Yes    Comment: Occasional  . Drug use: Never  . Sexual activity: Not Currently    Birth control/protection: Post-menopausal  Other Topics Concern  . Not on file  Social History Narrative  . Not on file   Social Determinants of Health   Financial Resource Strain: Not on file  Food Insecurity: Not on file  Transportation Needs: Not on file  Physical Activity: Not on file  Stress: Not on file  Social Connections: Not on file  Intimate Partner Violence: Not on file    FAMILY HISTORY: Family History  Problem Relation Age of Onset  . Diabetes Mother   . Thyroid disease Mother   . Liver disease Mother   . Obesity Mother   . Hypertension Father   . Hyperlipidemia Father   . Heart disease Father   . Stroke Father   . Obesity Father   . Colon polyps Father   . Prostate cancer Brother   . Brain cancer Maternal Grandfather   . Pancreatic cancer Paternal Grandmother   . Colon cancer  Neg Hx        No one in family with colon cancer.    Review of Systems - Oncology    PHYSICAL EXAMINATION    Vitals:   01/15/23 1502  BP: (!) 137/96  Pulse: 96  Resp: 18  Temp: (!) 97.5 F (36.4  C)  SpO2: 99%    Physical Exam  LABORATORY DATA:  CBC    Component Value Date/Time   WBC 7.7 06/13/2022 1358   WBC 6.9 12/06/2021 1042   RBC 4.13 06/13/2022 1358   HGB 13.1 06/13/2022 1358   HGB 13.4 09/27/2016 1342   HCT 38.2 06/13/2022 1358   HCT 40.9 09/27/2016 1342   PLT 147 (L) 06/13/2022 1358   MCV 92.5 06/13/2022 1358   MCV 83 09/27/2016 1342   MCH 31.7 06/13/2022 1358   MCHC 34.3 06/13/2022 1358   RDW 15.2 06/13/2022 1358   RDW 15.9 (H) 09/27/2016 1342   LYMPHSABS 1.3 06/13/2022 1358   LYMPHSABS 2.3 09/27/2016 1342   MONOABS 0.5 06/13/2022 1358   EOSABS 0.6 (H) 06/13/2022 1358   EOSABS 0.6 (H) 09/27/2016 1342   BASOSABS 0.0 06/13/2022 1358   BASOSABS 0.0 09/27/2016 1342    CMP     Component Value Date/Time   NA 139 06/13/2022 1358   NA 143 12/02/2018 1138   K 3.9 06/13/2022 1358   CL 105 06/13/2022 1358   CO2 24 06/13/2022 1358   GLUCOSE 142 (H) 06/13/2022 1358   BUN 16 06/13/2022 1358   BUN 17 12/02/2018 1138   CREATININE 1.28 (H) 06/13/2022 1358   CREATININE 1.09 (H) 02/18/2019 1524   CALCIUM 9.1 06/13/2022 1358   PROT 7.3 06/13/2022 1358   PROT 6.9 12/02/2018 1138   ALBUMIN 3.6 06/13/2022 1358   ALBUMIN 4.0 12/02/2018 1138   AST 15 06/13/2022 1358   ALT 12 06/13/2022 1358   ALKPHOS 68 06/13/2022 1358   BILITOT 0.2 (L) 06/13/2022 1358   GFRNONAA 49 (L) 06/13/2022 1358   GFRAA 70 12/02/2018 1138       PENDING LABS:   RADIOGRAPHIC STUDIES:  No results found.   PATHOLOGY:     ASSESSMENT and THERAPY PLAN:   No problem-specific Assessment & Plan notes found for this encounter.   No orders of the defined types were placed in this encounter.   All questions were answered. The patient knows to call the clinic with any  problems, questions or concerns. We can certainly see the patient much sooner if necessary. This note was electronically signed. Noreene Filbert, NP 01/15/2023

## 2023-01-16 ENCOUNTER — Encounter: Payer: Self-pay | Admitting: Hematology and Oncology

## 2023-01-16 ENCOUNTER — Telehealth: Payer: Self-pay

## 2023-01-16 LAB — CMP (CANCER CENTER ONLY)
ALT: 20 U/L (ref 0–44)
AST: 16 U/L (ref 15–41)
Albumin: 4 g/dL (ref 3.5–5.0)
Alkaline Phosphatase: 92 U/L (ref 38–126)
Anion gap: 9 (ref 5–15)
BUN: 19 mg/dL (ref 6–20)
CO2: 23 mmol/L (ref 22–32)
Calcium: 9.7 mg/dL (ref 8.9–10.3)
Chloride: 106 mmol/L (ref 98–111)
Creatinine: 1.36 mg/dL — ABNORMAL HIGH (ref 0.44–1.00)
GFR, Estimated: 45 mL/min — ABNORMAL LOW (ref >60)
Glucose, Bld: 326 mg/dL — ABNORMAL HIGH (ref 70–99)
Potassium: 3.8 mmol/L (ref 3.5–5.1)
Sodium: 138 mmol/L (ref 135–145)
Total Bilirubin: 0.3 mg/dL (ref 0.3–1.2)
Total Protein: 7.2 g/dL (ref 6.5–8.1)

## 2023-01-16 LAB — FERRITIN: Ferritin: 220 ng/mL (ref 11–307)

## 2023-01-16 LAB — SIGNATERA
SIGNATERA MTM READOUT: 0 MTM/ml
SIGNATERA TEST RESULT: NEGATIVE

## 2023-01-16 LAB — IRON AND IRON BINDING CAPACITY (CC-WL,HP ONLY)
Iron: 73 ug/dL (ref 28–170)
Saturation Ratios: 25 % (ref 10.4–31.8)
TIBC: 297 ug/dL (ref 250–450)
UIBC: 224 ug/dL (ref 148–442)

## 2023-01-16 LAB — SIGNATERA ONLY (NATERA MANAGED)
SIGNATERA MTM READOUT: 0 MTM/ml
SIGNATERA TEST RESULT: NEGATIVE

## 2023-01-16 NOTE — Telephone Encounter (Signed)
-----   Message from Loa Socks, NP sent at 01/16/2023 12:42 PM EDT ----- Please let patient know that her vitamin D level and B12 level are both low normal and I recommend supplementation.  Will you ask her if she is taking any? ----- Message ----- From: Interface, Lab In [place] Sent: 01/15/2023   4:13 PM EDT To: Loa Socks, NP

## 2023-01-16 NOTE — Telephone Encounter (Signed)
Called and spoke with patient regarding vitamin D and B12 levels per Lillard Anes NP, they are both low but normal and she recommends supplementation. Patient is currently not taking any supplementations but it is recommended she start taking Vitamin D and B12. Patient verbalized understanding.

## 2023-01-16 NOTE — Assessment & Plan Note (Signed)
right breast, a clinical T2 N0-1, stage IIA-IIB invasive ductal carcinoma, grade 2, estrogen receptor positive, progesterone receptor and HER2 negative, with an MIB-1 of 5%   left breast, a clinical T3 N0, stage IIIB invasive ductal carcinoma, grade 3, functionally triple negative, with an MIB-1 of 25% (HER2 results are pending).   neoadjuvant chemotherapy started 02/09/2021 consisting of pembrolizumab/ carboplatin/ paclitaxel stopped after 3 cycles due to peripheral neuropathy followed by pembrolizumab/ doxorubicin/ cyclophosphamide x4 started 04/13/2021, completed on 06/15/2021   Adjuvant radiation completed 11/27/2021 Treatment plan:  Adjuvant capecitabine 1000 mg p.o. twice daily 14 days on 7 days off for 6 months beginning March 2023 2. antiestrogens with Anastrozole  ___________________________________  Bilateral breast cancer: She continues on anastrozole daily with good tolerance.  Her most recent Signatera testing was negative.  She is also undergone on imaging that has been negative.  Her symptoms that could be concerning for recurrence are likely not related to it considering those factors. Shortness of breath and fatigue: This was attributed to deconditioning however in the interim she has also been diagnosed with type 2 diabetes with very elevated blood sugars.  I let her know that I think this is why she is feeling so poorly.  We will test a B12 iron and vitamin D level to identify whether any of those would benefit from supplementation. I recommended that she continue on her anastrozole daily.  She will repeat bone density testing in December 2025.  We discussed healthy diet and exercise today.  Leslie Wright will return in 6 months for continued follow-up.  She will continue to undergo Signatera testing in the meantime.  She knows to call for any questions or concerns that may arise between now and then.

## 2023-01-17 ENCOUNTER — Telehealth: Payer: Self-pay

## 2023-01-17 NOTE — Telephone Encounter (Signed)
Called and LVM with negative Signatera results, gave call back number with any questions.

## 2023-01-22 ENCOUNTER — Other Ambulatory Visit (HOSPITAL_COMMUNITY): Payer: Self-pay

## 2023-01-28 ENCOUNTER — Other Ambulatory Visit (HOSPITAL_COMMUNITY): Payer: Self-pay

## 2023-02-20 ENCOUNTER — Other Ambulatory Visit (HOSPITAL_COMMUNITY): Payer: Self-pay

## 2023-02-20 MED ORDER — TIRZEPATIDE 2.5 MG/0.5ML ~~LOC~~ SOAJ
2.5000 mg | SUBCUTANEOUS | 0 refills | Status: DC
  Filled 2023-02-20: qty 2, 28d supply, fill #0

## 2023-02-21 ENCOUNTER — Other Ambulatory Visit (HOSPITAL_COMMUNITY): Payer: Self-pay

## 2023-03-01 DIAGNOSIS — I10 Essential (primary) hypertension: Secondary | ICD-10-CM | POA: Diagnosis not present

## 2023-03-01 DIAGNOSIS — E0865 Diabetes mellitus due to underlying condition with hyperglycemia: Secondary | ICD-10-CM | POA: Diagnosis not present

## 2023-03-01 DIAGNOSIS — E1165 Type 2 diabetes mellitus with hyperglycemia: Secondary | ICD-10-CM | POA: Diagnosis not present

## 2023-03-05 DIAGNOSIS — L718 Other rosacea: Secondary | ICD-10-CM | POA: Diagnosis not present

## 2023-03-05 DIAGNOSIS — L400 Psoriasis vulgaris: Secondary | ICD-10-CM | POA: Diagnosis not present

## 2023-03-06 ENCOUNTER — Other Ambulatory Visit (HOSPITAL_COMMUNITY): Payer: Self-pay

## 2023-03-06 MED ORDER — BETAMETHASONE DIPROPIONATE 0.05 % EX LOTN
TOPICAL_LOTION | CUTANEOUS | 11 refills | Status: DC
  Filled 2023-03-06: qty 60, 30d supply, fill #0
  Filled 2023-08-28 – 2023-11-19 (×2): qty 60, 30d supply, fill #1

## 2023-03-06 MED ORDER — CALCIPOTRIENE 0.005 % EX SOLN
CUTANEOUS | 11 refills | Status: DC
  Filled 2023-03-06: qty 60, 30d supply, fill #0
  Filled 2023-08-28 – 2023-11-19 (×2): qty 60, 30d supply, fill #1

## 2023-03-07 ENCOUNTER — Other Ambulatory Visit: Payer: Self-pay

## 2023-03-07 ENCOUNTER — Other Ambulatory Visit (HOSPITAL_COMMUNITY): Payer: Self-pay

## 2023-03-08 ENCOUNTER — Other Ambulatory Visit: Payer: Self-pay

## 2023-03-13 DIAGNOSIS — E119 Type 2 diabetes mellitus without complications: Secondary | ICD-10-CM | POA: Diagnosis not present

## 2023-03-25 DIAGNOSIS — I10 Essential (primary) hypertension: Secondary | ICD-10-CM | POA: Diagnosis not present

## 2023-03-25 DIAGNOSIS — E782 Mixed hyperlipidemia: Secondary | ICD-10-CM | POA: Diagnosis not present

## 2023-03-25 DIAGNOSIS — E1165 Type 2 diabetes mellitus with hyperglycemia: Secondary | ICD-10-CM | POA: Diagnosis not present

## 2023-04-01 DIAGNOSIS — E1169 Type 2 diabetes mellitus with other specified complication: Secondary | ICD-10-CM | POA: Diagnosis not present

## 2023-04-01 DIAGNOSIS — I152 Hypertension secondary to endocrine disorders: Secondary | ICD-10-CM | POA: Diagnosis not present

## 2023-04-01 DIAGNOSIS — E1121 Type 2 diabetes mellitus with diabetic nephropathy: Secondary | ICD-10-CM | POA: Diagnosis not present

## 2023-04-01 DIAGNOSIS — E1165 Type 2 diabetes mellitus with hyperglycemia: Secondary | ICD-10-CM | POA: Diagnosis not present

## 2023-04-01 DIAGNOSIS — Z789 Other specified health status: Secondary | ICD-10-CM | POA: Diagnosis not present

## 2023-04-01 DIAGNOSIS — E1159 Type 2 diabetes mellitus with other circulatory complications: Secondary | ICD-10-CM | POA: Diagnosis not present

## 2023-04-01 DIAGNOSIS — I129 Hypertensive chronic kidney disease with stage 1 through stage 4 chronic kidney disease, or unspecified chronic kidney disease: Secondary | ICD-10-CM | POA: Diagnosis not present

## 2023-04-01 DIAGNOSIS — E1122 Type 2 diabetes mellitus with diabetic chronic kidney disease: Secondary | ICD-10-CM | POA: Diagnosis not present

## 2023-04-01 DIAGNOSIS — I10 Essential (primary) hypertension: Secondary | ICD-10-CM | POA: Diagnosis not present

## 2023-04-02 ENCOUNTER — Other Ambulatory Visit (HOSPITAL_COMMUNITY): Payer: Self-pay

## 2023-04-02 ENCOUNTER — Other Ambulatory Visit: Payer: Self-pay

## 2023-04-02 MED ORDER — MOUNJARO 5 MG/0.5ML ~~LOC~~ SOAJ
2.5000 mg | SUBCUTANEOUS | 0 refills | Status: DC
  Filled 2023-04-02: qty 2, 28d supply, fill #0

## 2023-04-02 MED ORDER — ROSUVASTATIN CALCIUM 10 MG PO TABS
10.0000 mg | ORAL_TABLET | Freq: Every day | ORAL | 3 refills | Status: DC
  Filled 2023-04-02: qty 90, 90d supply, fill #0
  Filled 2023-08-28: qty 90, 90d supply, fill #1
  Filled 2023-12-16: qty 90, 90d supply, fill #2

## 2023-05-08 ENCOUNTER — Other Ambulatory Visit (HOSPITAL_COMMUNITY): Payer: Self-pay

## 2023-05-08 MED ORDER — TIRZEPATIDE 5 MG/0.5ML ~~LOC~~ SOAJ
2.5000 mg | SUBCUTANEOUS | 0 refills | Status: DC
  Filled 2023-05-08: qty 2, 28d supply, fill #0

## 2023-05-09 ENCOUNTER — Other Ambulatory Visit (HOSPITAL_COMMUNITY): Payer: Self-pay

## 2023-06-04 ENCOUNTER — Telehealth: Payer: Self-pay

## 2023-06-04 NOTE — Telephone Encounter (Signed)
Attempted to call pt regarding signatera results lvm that signatera results were negative and they will be back out within the  next 3-6 months to repeat labs.

## 2023-06-11 ENCOUNTER — Other Ambulatory Visit: Payer: Self-pay

## 2023-06-11 ENCOUNTER — Other Ambulatory Visit (HOSPITAL_COMMUNITY): Payer: Self-pay

## 2023-06-12 ENCOUNTER — Other Ambulatory Visit (HOSPITAL_COMMUNITY): Payer: Self-pay

## 2023-06-12 MED ORDER — TIRZEPATIDE 5 MG/0.5ML ~~LOC~~ SOAJ
2.5000 mg | SUBCUTANEOUS | 0 refills | Status: DC
  Filled 2023-06-12: qty 2, 56d supply, fill #0
  Filled 2023-06-12: qty 2, 28d supply, fill #0

## 2023-06-18 ENCOUNTER — Other Ambulatory Visit (HOSPITAL_COMMUNITY): Payer: Self-pay

## 2023-06-19 ENCOUNTER — Other Ambulatory Visit: Payer: Self-pay

## 2023-06-19 ENCOUNTER — Other Ambulatory Visit (HOSPITAL_COMMUNITY): Payer: Self-pay

## 2023-06-25 DIAGNOSIS — Z0000 Encounter for general adult medical examination without abnormal findings: Secondary | ICD-10-CM | POA: Diagnosis not present

## 2023-06-25 DIAGNOSIS — E1165 Type 2 diabetes mellitus with hyperglycemia: Secondary | ICD-10-CM | POA: Diagnosis not present

## 2023-06-25 DIAGNOSIS — R5383 Other fatigue: Secondary | ICD-10-CM | POA: Diagnosis not present

## 2023-06-25 DIAGNOSIS — E782 Mixed hyperlipidemia: Secondary | ICD-10-CM | POA: Diagnosis not present

## 2023-06-25 DIAGNOSIS — I10 Essential (primary) hypertension: Secondary | ICD-10-CM | POA: Diagnosis not present

## 2023-06-25 DIAGNOSIS — Z13220 Encounter for screening for lipoid disorders: Secondary | ICD-10-CM | POA: Diagnosis not present

## 2023-06-25 DIAGNOSIS — Z114 Encounter for screening for human immunodeficiency virus [HIV]: Secondary | ICD-10-CM | POA: Diagnosis not present

## 2023-07-02 ENCOUNTER — Other Ambulatory Visit: Payer: Self-pay

## 2023-07-02 ENCOUNTER — Other Ambulatory Visit (HOSPITAL_COMMUNITY): Payer: Self-pay

## 2023-07-02 DIAGNOSIS — Z23 Encounter for immunization: Secondary | ICD-10-CM | POA: Diagnosis not present

## 2023-07-02 DIAGNOSIS — R7989 Other specified abnormal findings of blood chemistry: Secondary | ICD-10-CM | POA: Diagnosis not present

## 2023-07-02 DIAGNOSIS — Z0000 Encounter for general adult medical examination without abnormal findings: Secondary | ICD-10-CM | POA: Diagnosis not present

## 2023-07-02 DIAGNOSIS — I10 Essential (primary) hypertension: Secondary | ICD-10-CM | POA: Diagnosis not present

## 2023-07-02 DIAGNOSIS — E1165 Type 2 diabetes mellitus with hyperglycemia: Secondary | ICD-10-CM | POA: Diagnosis not present

## 2023-07-02 DIAGNOSIS — H6121 Impacted cerumen, right ear: Secondary | ICD-10-CM | POA: Diagnosis not present

## 2023-07-02 MED ORDER — CARVEDILOL 12.5 MG PO TABS
12.5000 mg | ORAL_TABLET | Freq: Two times a day (BID) | ORAL | 0 refills | Status: DC
  Filled 2023-07-02: qty 180, 90d supply, fill #0

## 2023-07-08 ENCOUNTER — Other Ambulatory Visit (HOSPITAL_COMMUNITY): Payer: Self-pay

## 2023-07-09 ENCOUNTER — Telehealth: Payer: Self-pay | Admitting: Hematology and Oncology

## 2023-07-09 DIAGNOSIS — R7989 Other specified abnormal findings of blood chemistry: Secondary | ICD-10-CM | POA: Diagnosis not present

## 2023-07-09 DIAGNOSIS — K760 Fatty (change of) liver, not elsewhere classified: Secondary | ICD-10-CM | POA: Diagnosis not present

## 2023-07-09 DIAGNOSIS — I10 Essential (primary) hypertension: Secondary | ICD-10-CM | POA: Diagnosis not present

## 2023-07-09 DIAGNOSIS — E782 Mixed hyperlipidemia: Secondary | ICD-10-CM | POA: Diagnosis not present

## 2023-07-09 DIAGNOSIS — K219 Gastro-esophageal reflux disease without esophagitis: Secondary | ICD-10-CM | POA: Diagnosis not present

## 2023-07-09 DIAGNOSIS — Z1211 Encounter for screening for malignant neoplasm of colon: Secondary | ICD-10-CM | POA: Diagnosis not present

## 2023-07-09 NOTE — Telephone Encounter (Signed)
07/09/2023; Due to a change in the provider schedule, I called the patient and left a voice mail with details of her rescheduled appointment. I mailed a reminder notice with the new date and time.

## 2023-07-17 ENCOUNTER — Ambulatory Visit: Payer: 59 | Admitting: Hematology and Oncology

## 2023-07-17 DIAGNOSIS — I10 Essential (primary) hypertension: Secondary | ICD-10-CM | POA: Diagnosis not present

## 2023-07-17 DIAGNOSIS — I152 Hypertension secondary to endocrine disorders: Secondary | ICD-10-CM | POA: Diagnosis not present

## 2023-07-17 DIAGNOSIS — E1121 Type 2 diabetes mellitus with diabetic nephropathy: Secondary | ICD-10-CM | POA: Diagnosis not present

## 2023-07-17 DIAGNOSIS — E782 Mixed hyperlipidemia: Secondary | ICD-10-CM | POA: Diagnosis not present

## 2023-07-17 DIAGNOSIS — I129 Hypertensive chronic kidney disease with stage 1 through stage 4 chronic kidney disease, or unspecified chronic kidney disease: Secondary | ICD-10-CM | POA: Diagnosis not present

## 2023-07-17 DIAGNOSIS — E1159 Type 2 diabetes mellitus with other circulatory complications: Secondary | ICD-10-CM | POA: Diagnosis not present

## 2023-07-17 DIAGNOSIS — E1122 Type 2 diabetes mellitus with diabetic chronic kidney disease: Secondary | ICD-10-CM | POA: Diagnosis not present

## 2023-07-17 DIAGNOSIS — E1169 Type 2 diabetes mellitus with other specified complication: Secondary | ICD-10-CM | POA: Diagnosis not present

## 2023-07-17 DIAGNOSIS — E1165 Type 2 diabetes mellitus with hyperglycemia: Secondary | ICD-10-CM | POA: Diagnosis not present

## 2023-07-31 ENCOUNTER — Other Ambulatory Visit (HOSPITAL_COMMUNITY): Payer: Self-pay

## 2023-07-31 MED ORDER — TIRZEPATIDE 5 MG/0.5ML ~~LOC~~ SOAJ
2.5000 mg | SUBCUTANEOUS | 0 refills | Status: DC
  Filled 2023-07-31: qty 2, 28d supply, fill #0
  Filled 2023-08-01: qty 2, 56d supply, fill #0

## 2023-08-01 ENCOUNTER — Other Ambulatory Visit (HOSPITAL_COMMUNITY): Payer: Self-pay

## 2023-08-06 ENCOUNTER — Other Ambulatory Visit: Payer: Self-pay

## 2023-08-06 ENCOUNTER — Other Ambulatory Visit (HOSPITAL_COMMUNITY): Payer: Self-pay

## 2023-08-20 ENCOUNTER — Inpatient Hospital Stay: Payer: Commercial Managed Care - PPO | Attending: Hematology and Oncology | Admitting: Hematology and Oncology

## 2023-08-20 ENCOUNTER — Other Ambulatory Visit: Payer: Self-pay

## 2023-08-20 VITALS — BP 127/84 | HR 83 | Temp 97.5°F | Resp 18 | Ht 67.0 in | Wt 273.4 lb

## 2023-08-20 DIAGNOSIS — Z79811 Long term (current) use of aromatase inhibitors: Secondary | ICD-10-CM | POA: Diagnosis not present

## 2023-08-20 DIAGNOSIS — Z1732 Human epidermal growth factor receptor 2 negative status: Secondary | ICD-10-CM | POA: Insufficient documentation

## 2023-08-20 DIAGNOSIS — Z1722 Progesterone receptor negative status: Secondary | ICD-10-CM | POA: Diagnosis not present

## 2023-08-20 DIAGNOSIS — Z170 Estrogen receptor positive status [ER+]: Secondary | ICD-10-CM | POA: Insufficient documentation

## 2023-08-20 DIAGNOSIS — C50511 Malignant neoplasm of lower-outer quadrant of right female breast: Secondary | ICD-10-CM | POA: Diagnosis not present

## 2023-08-20 DIAGNOSIS — Z171 Estrogen receptor negative status [ER-]: Secondary | ICD-10-CM | POA: Insufficient documentation

## 2023-08-20 DIAGNOSIS — C50812 Malignant neoplasm of overlapping sites of left female breast: Secondary | ICD-10-CM | POA: Insufficient documentation

## 2023-08-20 MED ORDER — LETROZOLE 2.5 MG PO TABS
2.5000 mg | ORAL_TABLET | Freq: Every day | ORAL | 3 refills | Status: DC
  Filled 2023-08-20: qty 90, 90d supply, fill #0
  Filled 2023-12-16: qty 90, 90d supply, fill #1

## 2023-08-20 NOTE — Progress Notes (Signed)
Patient Care Team: [name], MD as PCP - General (Family Medicine) Almond Lint, MD as Consulting Physician (General Surgery) Dorothy Puffer, MD as Consulting Physician (Radiation Oncology) Serena Croissant, MD as Consulting Physician (Hematology and Oncology)  DIAGNOSIS:  Encounter Diagnosis  Name Primary?   Malignant neoplasm of lower-outer quadrant of right breast of female, estrogen receptor positive (HCC) Yes    SUMMARY OF ONCOLOGIC HISTORY: Oncology History  Malignant neoplasm of lower-outer quadrant of right breast of female, estrogen receptor positive (HCC)  01/20/2021 Cancer Staging   Staging form: Breast, AJCC 8th Edition - Clinical stage from 01/20/2021: Stage IA (cT1c, cN0, cM0, G2, ER+, PR-, HER2-) - Signed by [name], MD on 01/25/2021 Stage prefix: Initial diagnosis Histologic grading system: 3 grade system   01/23/2021 Initial Diagnosis   Malignant neoplasm of lower-outer quadrant of right breast of female, estrogen receptor positive (HCC)   02/10/2021 - 06/17/2021 Chemotherapy   Patient is on Treatment Plan : BREAST Pembrolizumab + Carboplatin D1 + Paclitaxel D1,8,15 q21d X 4 cycles / Pembrolizumab + AC q21d x 4 cycles      Genetic Testing   Negative genetic testing. No pathogenic variants identified on the [facility] Multi-Cancer Panel+RNA. VUS in ATM called c.1132A>G identified. The report date is 02/13/2021.  The Multi-Cancer Panel + RNA offered by [facility] includes sequencing and/or deletion duplication testing of the following 84 genes: AIP, ALK, APC, ATM, AXIN2,BAP1,  BARD1, BLM, BMPR1A, BRCA1, BRCA2, BRIP1, CASR, CDC73, CDH1, CDK4, CDKN1B, CDKN1C, CDKN2A (p14ARF), CDKN2A (p16INK4a), CEBPA, CHEK2, CTNNA1, DICER1, DIS3L2, EGFR (c.2369C>T, p.Thr790Met variant only), EPCAM (Deletion/duplication testing only), FH, FLCN, GATA2, GPC3, GREM1 (Promoter region deletion/duplication testing only), HOXB13 (c.251G>A, p.Gly84Glu), HRAS, KIT, MAX, MEN1, MET, MITF  (c.952G>A, p.Glu318Lys variant only), MLH1, MSH2, MSH3, MSH6, MUTYH, NBN, NF1, NF2, NTHL1, PALB2, PDGFRA, PHOX2B, PMS2, POLD1, POLE, POT1, PRKAR1A, PTCH1, PTEN, RAD50, RAD51C, RAD51D, RB1, RECQL4, RET, RUNX1, SDHAF2, SDHA (sequence changes only), SDHB, SDHC, SDHD, SMAD4, SMARCA4, SMARCB1, SMARCE1, STK11, SUFU, TERC, TERT, TMEM127, TP53, TSC1, TSC2, VHL, WRN and WT1.   08/08/2021 Surgery   She proceeded to bilateral mastectomies on 08/08/2021 under Dr. Donell Beers. Pathology from the procedure [id number]) showed: 1. BREAST, RIGHT, MASTECTOMY:  -  Focal residual invasive ductal carcinoma, status post neoadjuvant  therapy  -  Extensive sclerosing adenosis with associated calcifications  -  Sclerotic intraductal papilloma  -  Previous biopsy site changes present  -  See oncology table and comment below  2. LYMPH NODE, RIGHT AXILLARY, SENTINEL, EXCISION:  -  No carcinoma identified (0/2)  3. BREAST, LEFT, MASTECTOMY:  -  Residual multifocal invasive ductal carcinoma, status post  neoadjuvant therapy  -  Ductal carcinoma in-situ, high grade  -  Calcifications associated with carcinoma  -  Margins uninvolved by carcinoma (0.2 cm; anterior margin)  -  No carcinoma identified in one lymph node (0/1)  -  Lymphovascular space invasion present  -  Previous biopsy site changes present  -  See oncology table below  4. LYMPH NODE, LEFT AXILLARY, SENTINEL, EXCISION:  -  No carcinoma identified (0/4)     10/05/2021 - 11/27/2021 Radiation Therapy   10/05/2021 through 11/27/2021 Site Technique Total Dose (Gy) Dose per Fx (Gy) Completed Fx Beam Energies  Chest Wall, Left: CW_L 3D 50.4/50.4 1.8 28/28 10X  Chest Wall, Left: CW_L_SCLV 3D 50.4/50.4 1.8 28/28 6X, 10X  Chest Wall, Left: CW_L_Bst specialPort 10/10 2 5/5 9E     12/12/2021 -  Chemotherapy   Xeloda 500mg , 2 tablets  twice a day 14 days on, 7 days off x 6 months   06/2022 -  Anti-estrogen oral therapy   Anastrozole switched to letrozole 08/20/2023 (due  to diarrhea)   Malignant neoplasm of overlapping sites of left breast in female, estrogen receptor negative (HCC)  01/20/2021 Cancer Staging   Staging form: Breast, AJCC 8th Edition - Clinical stage from 01/20/2021: Stage IIIB (cT3, cN0, cM0, G3, ER-, PR-, HER2-) - Signed by [name], MD on 01/25/2021 Histologic grading system: 3 grade system   02/10/2021 - 06/17/2021 Chemotherapy   Patient is on Treatment Plan : BREAST Pembrolizumab + Carboplatin D1 + Paclitaxel D1,8,15 q21d X 4 cycles / Pembrolizumab + AC q21d x 4 cycles      Genetic Testing   Negative genetic testing. No pathogenic variants identified on the [employer] Multi-Cancer Panel+RNA. VUS in ATM called c.1132A>G identified. The report date is 02/13/2021.  The Multi-Cancer Panel + RNA offered by [facility] includes sequencing and/or deletion duplication testing of the following 84 genes: AIP, ALK, APC, ATM, AXIN2,BAP1,  BARD1, BLM, BMPR1A, BRCA1, BRCA2, BRIP1, CASR, CDC73, CDH1, CDK4, CDKN1B, CDKN1C, CDKN2A (p14ARF), CDKN2A (p16INK4a), CEBPA, CHEK2, CTNNA1, DICER1, DIS3L2, EGFR (c.2369C>T, p.Thr790Met variant only), EPCAM (Deletion/duplication testing only), FH, FLCN, GATA2, GPC3, GREM1 (Promoter region deletion/duplication testing only), HOXB13 (c.251G>A, p.Gly84Glu), HRAS, KIT, MAX, MEN1, MET, MITF (c.952G>A, p.Glu318Lys variant only), MLH1, MSH2, MSH3, MSH6, MUTYH, NBN, NF1, NF2, NTHL1, PALB2, PDGFRA, PHOX2B, PMS2, POLD1, POLE, POT1, PRKAR1A, PTCH1, PTEN, RAD50, RAD51C, RAD51D, RB1, RECQL4, RET, RUNX1, SDHAF2, SDHA (sequence changes only), SDHB, SDHC, SDHD, SMAD4, SMARCA4, SMARCB1, SMARCE1, STK11, SUFU, TERC, TERT, TMEM127, TP53, TSC1, TSC2, VHL, WRN and WT1.   07/28/2021 - 09/07/2021 Chemotherapy   Patient is on Treatment Plan : HEAD/NECK Pembrolizumab Q21D     08/08/2021 Surgery   She proceeded to bilateral mastectomies on 08/08/2021 under Dr. Donell Beers. Pathology from the procedure [id number]) showed: 1. BREAST, RIGHT, MASTECTOMY:   -  Focal residual invasive ductal carcinoma, status post neoadjuvant  therapy  -  Extensive sclerosing adenosis with associated calcifications  -  Sclerotic intraductal papilloma  -  Previous biopsy site changes present  -  See oncology table and comment below  2. LYMPH NODE, RIGHT AXILLARY, SENTINEL, EXCISION:  -  No carcinoma identified (0/2)  3. BREAST, LEFT, MASTECTOMY:  -  Residual multifocal invasive ductal carcinoma, status post  neoadjuvant therapy  -  Ductal carcinoma in-situ, high grade  -  Calcifications associated with carcinoma  -  Margins uninvolved by carcinoma (0.2 cm; anterior margin)  -  No carcinoma identified in one lymph node (0/1)  -  Lymphovascular space invasion present  -  Previous biopsy site changes present  -  See oncology table below  4. LYMPH NODE, LEFT AXILLARY, SENTINEL, EXCISION:  -  No carcinoma identified (0/4)     10/05/2021 - 11/27/2021 Radiation Therapy   10/05/2021 through 11/27/2021 Site Technique Total Dose (Gy) Dose per Fx (Gy) Completed Fx Beam Energies  Chest Wall, Left: CW_L 3D 50.4/50.4 1.8 28/28 10X  Chest Wall, Left: CW_L_SCLV 3D 50.4/50.4 1.8 28/28 6X, 10X  Chest Wall, Left: CW_L_Bst specialPort 10/10 2 5/5 9E     12/12/2021 -  Chemotherapy   Xeloda 500mg , 2 tablets twice a day 14 days on, 7 days off x 6 months     CHIEF COMPLIANT: Follow-up to discuss antiestrogen therapy  HISTORY OF PRESENT ILLNESS:  History of Present Illness   The patient, with a history of breast cancer and diabetes, presents for  a routine follow-up. She has been on anastrozole for over a year as part of her breast cancer treatment. However, she recently stopped the medication due to severe gastrointestinal side effects, including stomach upset and diarrhea. The patient reports that these symptoms have resolved since discontinuing the anastrozole. She also notes that certain foods, such as red meat and dairy products, can trigger diarrhea, suggesting possible  dietary sensitivities.  In addition to her cancer treatment, the patient is managing her diabetes with [facility], which she reports is well-tolerated and effective. She has lost twenty pounds over the past four to five months, which she attributes to the medication.         ALLERGIES:  has No Known Allergies.  MEDICATIONS:  Current Outpatient Medications  Medication Sig Dispense Refill   letrozole (FEMARA) 2.5 MG tablet Take 1 tablet (2.5 mg total) by mouth daily. 90 tablet 3   acetaminophen (TYLENOL) 500 MG tablet Take 1,000 mg by mouth every 6 (six) hours as needed for moderate pain, mild pain or headache.     aspirin EC 81 MG tablet Take 81 mg by mouth daily.     betamethasone dipropionate 0.05 % lotion Apply a small amount to the affected area on scalp once daily 60 mL 11   Calcipotriene 0.005 % solution Apply a small amount to affected area twice a day 60 mL 11   carvedilol (COREG) 12.5 MG tablet Take 1 tablet (12.5 mg total) by mouth 2 (two) times daily with a meal. This replaces losartan. 180 tablet 0   doxycycline (VIBRAMYCIN) 50 MG capsule Take 1 capsule (50 mg total) by mouth daily. Take with food. Use sun protection. 90 capsule 0   loperamide (IMODIUM) 2 MG capsule Take by mouth as needed for diarrhea or loose stools. Take 2 tabs (4 mg) with first loose stool, then 1 tab (2 mg) with each additional loose stool. Do not take more than 8 tabs (16 mg) in a 24-hour period.     metroNIDAZOLE (METROCREAM) 0.75 % cream Apply dime-size amount to face twice daily as directed. Taper use to daily once improved. 45 g 3   rosuvastatin (CRESTOR) 10 MG tablet Take 1 tablet (10 mg total) by mouth at bedtime for cholesterol 90 tablet 3   tirzepatide (MOUNJARO) 5 MG/0.5ML Pen Inject 2.5 mg into the skin once a week. 2 mL 0   No current facility-administered medications for this visit.    PHYSICAL EXAMINATION: ECOG PERFORMANCE STATUS: 1 - Symptomatic but completely ambulatory  Vitals:   08/20/23  1336  BP: 127/84  Pulse: 83  Resp: 18  Temp: (!) 97.5 F (36.4 C)  SpO2: 98%   Filed Weights   08/20/23 1336  Weight: 273 lb 6.4 oz (124 kg)    Physical Exam   MEASUREMENTS: WT- 20 pounds lost in 4 months      (exam performed in the presence of a chaperone)  LABORATORY DATA:  I have reviewed the data as listed    Latest Ref Rng & Units 01/15/2023    3:54 PM 06/13/2022    1:58 PM 05/09/2022   12:51 PM  CMP  Glucose 70 - 99 mg/dL 161  096  045   BUN 6 - 20 mg/dL 19  16  19    Creatinine 0.44 - 1.00 mg/dL 4.09  8.11  9.14   Sodium 135 - 145 mmol/L 138  139  137   Potassium 3.5 - 5.1 mmol/L 3.8  3.9  3.9   Chloride 98 -  111 mmol/L 106  105  106   CO2 22 - 32 mmol/L 23  24  24    Calcium 8.9 - 10.3 mg/dL 9.7  9.1  8.9   Total Protein 6.5 - 8.1 g/dL 7.2  7.3  7.6   Total Bilirubin 0.3 - 1.2 mg/dL 0.3  0.2  0.3   Alkaline Phos 38 - 126 U/L 92  68  72   AST 15 - 41 U/L 16  15  11    ALT 0 - 44 U/L 20  12  10      Lab Results  Component Value Date   WBC 6.9 01/15/2023   HGB 12.8 01/15/2023   HCT 37.8 01/15/2023   MCV 90.2 01/15/2023   PLT 196 01/15/2023   NEUTROABS 4.7 01/15/2023    ASSESSMENT & PLAN:  Malignant neoplasm of lower-outer quadrant of right breast of female, estrogen receptor positive (HCC) right breast, a clinical T2 N0-1, stage IIA-IIB invasive ductal carcinoma, grade 2, estrogen receptor positive, progesterone receptor and HER2 negative, with an MIB-1 of 5%   left breast, a clinical T3 N0, stage IIIB invasive ductal carcinoma, grade 3, functionally triple negative, with an MIB-1 of 25% (HER2 results are pending).   neoadjuvant chemotherapy started 02/09/2021 consisting of pembrolizumab/ carboplatin/ paclitaxel stopped after 3 cycles due to peripheral neuropathy followed by pembrolizumab/ doxorubicin/ cyclophosphamide x4 started 04/13/2021, completed on 06/15/2021   Bilateral Mastectomies: 08/08/2021: Bilateral mastectomies. Right mastectomy: Focal residual IDC  01 6 cm, 0/2 lymph nodes negative, ER 90%, PR 0%, HER2 negative, Ki-67 5% Left mastectomy: Residual multifocal IDC 7 cm with DCIS margins -0/5 lymph nodes negative, ER weak 5%, PR 0%, HER2 negative, Ki-67 25%   Adjuvant radiation completed 11/27/2021   Treatment plan:  Adjuvant capecitabine 1000 mg p.o. twice daily 14 days on 7 days off for 6 months beginning March 2023 completed September 2023 2. antiestrogens for the right-sided breast cancer: Start September 2023 ------------------------------------------------------------- Treatment plan: Antiestrogen therapy with anastrozole 1 mg daily x5 to 7 years started 06/13/2022, switched to letrozole 08/20/2023 due to diarrhea   Breast cancer surveillance: 1.  CT CAP 07/15/2022: No metastatic disease.  Tiny left upper lung nodules from radiation.  Hepatic steatosis. 2. no role of mammograms since patient had bilateral mastectomies 3.  Physical examination: No palpable lumps or nodules  Patient lost 20 pounds on [facility] Patient will call us if she cannot tolerate letrozole so that we can switch her to exemestane. Return to clinic in 1 year for follow-up    No orders of the defined types were placed in this encounter.  The patient has a good understanding of the overall plan. she agrees with it. she will call with any problems that may develop before the next visit here. Total time spent: 30 mins including face to face time and time spent for planning, charting and co-ordination of care   Tamsen Meek, MD 08/20/23

## 2023-08-20 NOTE — Assessment & Plan Note (Addendum)
right breast, a clinical T2 N0-1, stage IIA-IIB invasive ductal carcinoma, grade 2, estrogen receptor positive, progesterone receptor and HER2 negative, with an MIB-1 of 5%   left breast, a clinical T3 N0, stage IIIB invasive ductal carcinoma, grade 3, functionally triple negative, with an MIB-1 of 25% (HER2 results are pending).   neoadjuvant chemotherapy started 02/09/2021 consisting of pembrolizumab/ carboplatin/ paclitaxel stopped after 3 cycles due to peripheral neuropathy followed by pembrolizumab/ doxorubicin/ cyclophosphamide x4 started 04/13/2021, completed on 06/15/2021   Bilateral Mastectomies: 08/08/2021: Bilateral mastectomies. Right mastectomy: Focal residual IDC 01 6 cm, 0/2 lymph nodes negative, ER 90%, PR 0%, HER2 negative, Ki-67 5% Left mastectomy: Residual multifocal IDC 7 cm with DCIS margins -0/5 lymph nodes negative, ER weak 5%, PR 0%, HER2 negative, Ki-67 25%   Adjuvant radiation completed 11/27/2021   Treatment plan:  Adjuvant capecitabine 1000 mg p.o. twice daily 14 days on 7 days off for 6 months beginning March 2023 completed September 2023 2. antiestrogens for the right-sided breast cancer: Start September 2023 ------------------------------------------------------------- Treatment plan: Antiestrogen therapy with anastrozole 1 mg daily x5 to 7 years started 06/13/2022, switched to letrozole 08/20/2023 due to diarrhea   Breast cancer surveillance: 1.  CT CAP 07/15/2022: No metastatic disease.  Tiny left upper lung nodules from radiation.  Hepatic steatosis. 2. no role of mammograms since patient had bilateral mastectomies 3.  Physical examination: No palpable lumps or nodules  Patient will call us if she cannot tolerate letrozole so that we can switch her to exemestane. Return to clinic in 1 year for follow-up

## 2023-08-28 ENCOUNTER — Other Ambulatory Visit: Payer: Self-pay

## 2023-08-28 ENCOUNTER — Other Ambulatory Visit (HOSPITAL_COMMUNITY): Payer: Self-pay

## 2023-08-28 ENCOUNTER — Telehealth: Payer: Self-pay

## 2023-08-28 LAB — SIGNATERA
SIGNATERA MTM READOUT: 0 MTM/ml
SIGNATERA TEST RESULT: NEGATIVE

## 2023-08-28 MED ORDER — TIRZEPATIDE 5 MG/0.5ML ~~LOC~~ SOAJ
2.5000 mg | SUBCUTANEOUS | 0 refills | Status: DC
  Filled 2023-08-28 – 2023-09-12 (×3): qty 2, 56d supply, fill #0

## 2023-08-28 NOTE — Telephone Encounter (Signed)
Attempted to call pt regarding [employer] results lvm with results and advise for pt to return call back if any questions or concerns.

## 2023-08-30 ENCOUNTER — Other Ambulatory Visit: Payer: Self-pay

## 2023-09-12 ENCOUNTER — Other Ambulatory Visit: Payer: Self-pay

## 2023-09-12 ENCOUNTER — Other Ambulatory Visit (HOSPITAL_COMMUNITY): Payer: Self-pay

## 2023-09-18 ENCOUNTER — Other Ambulatory Visit (HOSPITAL_COMMUNITY): Payer: Self-pay

## 2023-09-18 MED ORDER — MOUNJARO 5 MG/0.5ML ~~LOC~~ SOAJ
5.0000 mg | SUBCUTANEOUS | 0 refills | Status: DC
  Filled 2023-09-18: qty 2, 28d supply, fill #0

## 2023-10-04 ENCOUNTER — Other Ambulatory Visit (HOSPITAL_COMMUNITY): Payer: Self-pay

## 2023-10-04 MED ORDER — CARVEDILOL 12.5 MG PO TABS
12.5000 mg | ORAL_TABLET | Freq: Two times a day (BID) | ORAL | 0 refills | Status: DC
  Filled 2023-10-04: qty 180, 90d supply, fill #0

## 2023-10-05 ENCOUNTER — Other Ambulatory Visit (HOSPITAL_COMMUNITY): Payer: Self-pay

## 2023-10-06 ENCOUNTER — Other Ambulatory Visit (HOSPITAL_COMMUNITY): Payer: Self-pay

## 2023-10-06 MED ORDER — GOLYTELY 236 G PO SOLR
ORAL | 0 refills | Status: DC
  Filled 2023-10-06: qty 4000, 1d supply, fill #0

## 2023-10-07 ENCOUNTER — Other Ambulatory Visit (HOSPITAL_COMMUNITY): Payer: Self-pay

## 2023-10-08 DIAGNOSIS — R739 Hyperglycemia, unspecified: Secondary | ICD-10-CM | POA: Diagnosis not present

## 2023-10-08 DIAGNOSIS — E785 Hyperlipidemia, unspecified: Secondary | ICD-10-CM | POA: Diagnosis not present

## 2023-10-14 DIAGNOSIS — K5730 Diverticulosis of large intestine without perforation or abscess without bleeding: Secondary | ICD-10-CM | POA: Diagnosis not present

## 2023-10-14 DIAGNOSIS — R194 Change in bowel habit: Secondary | ICD-10-CM | POA: Diagnosis not present

## 2023-10-14 DIAGNOSIS — K635 Polyp of colon: Secondary | ICD-10-CM | POA: Diagnosis not present

## 2023-10-14 DIAGNOSIS — D123 Benign neoplasm of transverse colon: Secondary | ICD-10-CM | POA: Diagnosis not present

## 2023-10-14 DIAGNOSIS — K562 Volvulus: Secondary | ICD-10-CM | POA: Diagnosis not present

## 2023-10-14 DIAGNOSIS — K6289 Other specified diseases of anus and rectum: Secondary | ICD-10-CM | POA: Diagnosis not present

## 2023-10-14 DIAGNOSIS — Z1211 Encounter for screening for malignant neoplasm of colon: Secondary | ICD-10-CM | POA: Diagnosis not present

## 2023-10-15 ENCOUNTER — Other Ambulatory Visit (HOSPITAL_COMMUNITY): Payer: Self-pay

## 2023-10-15 DIAGNOSIS — Z789 Other specified health status: Secondary | ICD-10-CM | POA: Diagnosis not present

## 2023-10-15 DIAGNOSIS — E1122 Type 2 diabetes mellitus with diabetic chronic kidney disease: Secondary | ICD-10-CM | POA: Diagnosis not present

## 2023-10-15 DIAGNOSIS — C50919 Malignant neoplasm of unspecified site of unspecified female breast: Secondary | ICD-10-CM | POA: Diagnosis not present

## 2023-10-15 DIAGNOSIS — E782 Mixed hyperlipidemia: Secondary | ICD-10-CM | POA: Diagnosis not present

## 2023-10-15 DIAGNOSIS — I129 Hypertensive chronic kidney disease with stage 1 through stage 4 chronic kidney disease, or unspecified chronic kidney disease: Secondary | ICD-10-CM | POA: Diagnosis not present

## 2023-10-15 DIAGNOSIS — E1165 Type 2 diabetes mellitus with hyperglycemia: Secondary | ICD-10-CM | POA: Diagnosis not present

## 2023-10-15 DIAGNOSIS — I10 Essential (primary) hypertension: Secondary | ICD-10-CM | POA: Diagnosis not present

## 2023-10-15 MED ORDER — ROSUVASTATIN CALCIUM 10 MG PO TABS
10.0000 mg | ORAL_TABLET | Freq: Every day | ORAL | 3 refills | Status: DC
  Filled 2023-10-15 – 2024-04-23 (×3): qty 90, 90d supply, fill #0

## 2023-10-15 MED ORDER — MOUNJARO 5 MG/0.5ML ~~LOC~~ SOAJ
5.0000 mg | SUBCUTANEOUS | 0 refills | Status: DC
  Filled 2023-10-15: qty 2, 28d supply, fill #0

## 2023-10-15 MED ORDER — CARVEDILOL 3.125 MG PO TABS
3.1250 mg | ORAL_TABLET | Freq: Two times a day (BID) | ORAL | 3 refills | Status: DC
  Filled 2023-10-15: qty 60, 30d supply, fill #0
  Filled 2023-11-19: qty 60, 30d supply, fill #1
  Filled 2023-12-16: qty 60, 30d supply, fill #2
  Filled 2024-01-14: qty 60, 30d supply, fill #3

## 2023-10-16 ENCOUNTER — Other Ambulatory Visit: Payer: Self-pay

## 2023-10-17 ENCOUNTER — Other Ambulatory Visit (HOSPITAL_COMMUNITY): Payer: Self-pay

## 2023-11-19 ENCOUNTER — Other Ambulatory Visit: Payer: Self-pay

## 2023-11-19 ENCOUNTER — Other Ambulatory Visit (HOSPITAL_COMMUNITY): Payer: Self-pay

## 2023-11-20 ENCOUNTER — Other Ambulatory Visit (HOSPITAL_COMMUNITY): Payer: Self-pay

## 2023-11-20 MED ORDER — TIRZEPATIDE 5 MG/0.5ML ~~LOC~~ SOAJ
5.0000 mg | SUBCUTANEOUS | 0 refills | Status: DC
Start: 2023-11-20 — End: 2023-12-17
  Filled 2023-11-20: qty 2, 28d supply, fill #0

## 2023-12-03 ENCOUNTER — Other Ambulatory Visit: Payer: Self-pay | Admitting: *Deleted

## 2023-12-03 DIAGNOSIS — Z170 Estrogen receptor positive status [ER+]: Secondary | ICD-10-CM

## 2023-12-03 NOTE — Progress Notes (Signed)
 [employer] renewal orders placed per MD request.

## 2023-12-16 ENCOUNTER — Other Ambulatory Visit (HOSPITAL_COMMUNITY): Payer: Self-pay

## 2023-12-16 ENCOUNTER — Other Ambulatory Visit: Payer: Self-pay

## 2023-12-17 ENCOUNTER — Other Ambulatory Visit: Payer: Self-pay

## 2023-12-17 ENCOUNTER — Other Ambulatory Visit (HOSPITAL_COMMUNITY): Payer: Self-pay

## 2023-12-17 MED ORDER — TIRZEPATIDE 5 MG/0.5ML ~~LOC~~ SOAJ
5.0000 mg | SUBCUTANEOUS | 0 refills | Status: DC
  Filled 2023-12-17: qty 2, 28d supply, fill #0

## 2023-12-23 ENCOUNTER — Other Ambulatory Visit (HOSPITAL_BASED_OUTPATIENT_CLINIC_OR_DEPARTMENT_OTHER): Payer: Self-pay

## 2024-01-10 DIAGNOSIS — R5383 Other fatigue: Secondary | ICD-10-CM | POA: Diagnosis not present

## 2024-01-10 DIAGNOSIS — R739 Hyperglycemia, unspecified: Secondary | ICD-10-CM | POA: Diagnosis not present

## 2024-01-10 DIAGNOSIS — E559 Vitamin D deficiency, unspecified: Secondary | ICD-10-CM | POA: Diagnosis not present

## 2024-01-14 ENCOUNTER — Other Ambulatory Visit (HOSPITAL_COMMUNITY): Payer: Self-pay

## 2024-01-15 ENCOUNTER — Other Ambulatory Visit (HOSPITAL_COMMUNITY): Payer: Self-pay

## 2024-01-15 DIAGNOSIS — E1165 Type 2 diabetes mellitus with hyperglycemia: Secondary | ICD-10-CM | POA: Diagnosis not present

## 2024-01-15 DIAGNOSIS — T148XXA Other injury of unspecified body region, initial encounter: Secondary | ICD-10-CM | POA: Diagnosis not present

## 2024-01-15 DIAGNOSIS — Z23 Encounter for immunization: Secondary | ICD-10-CM | POA: Diagnosis not present

## 2024-01-15 DIAGNOSIS — E559 Vitamin D deficiency, unspecified: Secondary | ICD-10-CM | POA: Diagnosis not present

## 2024-01-15 DIAGNOSIS — I10 Essential (primary) hypertension: Secondary | ICD-10-CM | POA: Diagnosis not present

## 2024-01-15 MED ORDER — MOUNJARO 5 MG/0.5ML ~~LOC~~ SOAJ
5.0000 mg | SUBCUTANEOUS | 4 refills | Status: DC
Start: 2024-01-15 — End: 2024-08-19
  Filled 2024-01-15: qty 2, 28d supply, fill #0
  Filled 2024-02-11: qty 2, 28d supply, fill #1
  Filled 2024-04-23: qty 2, 28d supply, fill #2

## 2024-01-15 MED ORDER — CARVEDILOL 3.125 MG PO TABS
3.1250 mg | ORAL_TABLET | Freq: Two times a day (BID) | ORAL | 3 refills | Status: DC
  Filled 2024-02-19: qty 180, 90d supply, fill #0
  Filled 2024-06-15: qty 180, 90d supply, fill #1

## 2024-01-16 ENCOUNTER — Other Ambulatory Visit: Payer: Self-pay

## 2024-01-16 ENCOUNTER — Other Ambulatory Visit (HOSPITAL_COMMUNITY): Payer: Self-pay

## 2024-02-11 ENCOUNTER — Other Ambulatory Visit (HOSPITAL_COMMUNITY): Payer: Self-pay

## 2024-02-19 ENCOUNTER — Other Ambulatory Visit (HOSPITAL_COMMUNITY): Payer: Self-pay

## 2024-03-05 DIAGNOSIS — C50511 Malignant neoplasm of lower-outer quadrant of right female breast: Secondary | ICD-10-CM | POA: Diagnosis not present

## 2024-03-05 DIAGNOSIS — Z170 Estrogen receptor positive status [ER+]: Secondary | ICD-10-CM | POA: Diagnosis not present

## 2024-03-09 ENCOUNTER — Other Ambulatory Visit (HOSPITAL_COMMUNITY): Payer: Self-pay

## 2024-03-09 DIAGNOSIS — D225 Melanocytic nevi of trunk: Secondary | ICD-10-CM | POA: Diagnosis not present

## 2024-03-09 DIAGNOSIS — L820 Inflamed seborrheic keratosis: Secondary | ICD-10-CM | POA: Diagnosis not present

## 2024-03-09 DIAGNOSIS — L400 Psoriasis vulgaris: Secondary | ICD-10-CM | POA: Diagnosis not present

## 2024-03-09 DIAGNOSIS — D2239 Melanocytic nevi of other parts of face: Secondary | ICD-10-CM | POA: Diagnosis not present

## 2024-03-09 DIAGNOSIS — L718 Other rosacea: Secondary | ICD-10-CM | POA: Diagnosis not present

## 2024-03-09 DIAGNOSIS — D1801 Hemangioma of skin and subcutaneous tissue: Secondary | ICD-10-CM | POA: Diagnosis not present

## 2024-03-09 DIAGNOSIS — I10 Essential (primary) hypertension: Secondary | ICD-10-CM | POA: Diagnosis not present

## 2024-03-09 MED ORDER — MOUNJARO 5 MG/0.5ML ~~LOC~~ SOAJ
5.0000 mg | SUBCUTANEOUS | 4 refills | Status: DC
  Filled 2024-03-09: qty 2, 28d supply, fill #0
  Filled 2024-06-15: qty 2, 28d supply, fill #1
  Filled 2024-07-09: qty 2, 28d supply, fill #2

## 2024-03-10 ENCOUNTER — Other Ambulatory Visit (HOSPITAL_COMMUNITY): Payer: Self-pay

## 2024-03-10 ENCOUNTER — Other Ambulatory Visit: Payer: Self-pay

## 2024-03-10 MED ORDER — METRONIDAZOLE 0.75 % EX CREA
TOPICAL_CREAM | CUTANEOUS | 3 refills | Status: AC
  Filled 2024-03-10: qty 45, 30d supply, fill #0
  Filled 2024-04-23: qty 45, 30d supply, fill #1
  Filled 2024-06-15: qty 45, 30d supply, fill #2

## 2024-03-22 LAB — SIGNATERA ONLY (NATERA MANAGED)
SIGNATERA MTM READOUT: 0 MTM/ml
SIGNATERA TEST RESULT: NEGATIVE

## 2024-04-23 ENCOUNTER — Other Ambulatory Visit: Payer: Self-pay

## 2024-04-23 ENCOUNTER — Other Ambulatory Visit (HOSPITAL_COMMUNITY): Payer: Self-pay

## 2024-06-09 DIAGNOSIS — L718 Other rosacea: Secondary | ICD-10-CM | POA: Diagnosis not present

## 2024-06-09 DIAGNOSIS — L400 Psoriasis vulgaris: Secondary | ICD-10-CM | POA: Diagnosis not present

## 2024-06-15 ENCOUNTER — Other Ambulatory Visit (HOSPITAL_COMMUNITY): Payer: Self-pay

## 2024-06-15 ENCOUNTER — Other Ambulatory Visit: Payer: Self-pay

## 2024-07-09 ENCOUNTER — Other Ambulatory Visit (HOSPITAL_COMMUNITY): Payer: Self-pay

## 2024-07-10 DIAGNOSIS — Z7185 Encounter for immunization safety counseling: Secondary | ICD-10-CM | POA: Diagnosis not present

## 2024-07-10 DIAGNOSIS — Z0000 Encounter for general adult medical examination without abnormal findings: Secondary | ICD-10-CM | POA: Diagnosis not present

## 2024-07-10 DIAGNOSIS — Z23 Encounter for immunization: Secondary | ICD-10-CM | POA: Diagnosis not present

## 2024-08-04 ENCOUNTER — Other Ambulatory Visit (HOSPITAL_COMMUNITY): Payer: Self-pay

## 2024-08-04 ENCOUNTER — Other Ambulatory Visit: Payer: Self-pay

## 2024-08-04 DIAGNOSIS — Z7985 Long-term (current) use of injectable non-insulin antidiabetic drugs: Secondary | ICD-10-CM | POA: Diagnosis not present

## 2024-08-04 DIAGNOSIS — E1169 Type 2 diabetes mellitus with other specified complication: Secondary | ICD-10-CM | POA: Diagnosis not present

## 2024-08-04 DIAGNOSIS — I10 Essential (primary) hypertension: Secondary | ICD-10-CM | POA: Diagnosis not present

## 2024-08-04 DIAGNOSIS — E1165 Type 2 diabetes mellitus with hyperglycemia: Secondary | ICD-10-CM | POA: Diagnosis not present

## 2024-08-04 DIAGNOSIS — E782 Mixed hyperlipidemia: Secondary | ICD-10-CM | POA: Diagnosis not present

## 2024-08-04 DIAGNOSIS — E1143 Type 2 diabetes mellitus with diabetic autonomic (poly)neuropathy: Secondary | ICD-10-CM | POA: Diagnosis not present

## 2024-08-04 DIAGNOSIS — Z789 Other specified health status: Secondary | ICD-10-CM | POA: Diagnosis not present

## 2024-08-04 DIAGNOSIS — Z79899 Other long term (current) drug therapy: Secondary | ICD-10-CM | POA: Diagnosis not present

## 2024-08-04 MED ORDER — CARVEDILOL 3.125 MG PO TABS
3.1250 mg | ORAL_TABLET | Freq: Two times a day (BID) | ORAL | 3 refills | Status: AC
  Filled 2024-08-04: qty 180, 90d supply, fill #0

## 2024-08-04 MED ORDER — PHENTERMINE HCL 15 MG PO CAPS
15.0000 mg | ORAL_CAPSULE | Freq: Every morning | ORAL | 0 refills | Status: DC
  Filled 2024-08-04: qty 30, 30d supply, fill #0

## 2024-08-04 MED ORDER — ROSUVASTATIN CALCIUM 10 MG PO TABS
10.0000 mg | ORAL_TABLET | Freq: Every day | ORAL | 3 refills | Status: AC
  Filled 2024-08-04: qty 90, 90d supply, fill #0
  Filled 2024-10-29: qty 90, 90d supply, fill #1

## 2024-08-05 ENCOUNTER — Other Ambulatory Visit (HOSPITAL_COMMUNITY): Payer: Self-pay

## 2024-08-18 DIAGNOSIS — Z170 Estrogen receptor positive status [ER+]: Secondary | ICD-10-CM | POA: Diagnosis not present

## 2024-08-18 DIAGNOSIS — C50511 Malignant neoplasm of lower-outer quadrant of right female breast: Secondary | ICD-10-CM | POA: Diagnosis not present

## 2024-08-19 ENCOUNTER — Inpatient Hospital Stay: Payer: Commercial Managed Care - PPO | Attending: Hematology and Oncology | Admitting: Hematology and Oncology

## 2024-08-19 VITALS — BP 139/79 | HR 72 | Temp 98.5°F | Resp 18 | Ht 67.0 in | Wt 253.9 lb

## 2024-08-19 DIAGNOSIS — Z1732 Human epidermal growth factor receptor 2 negative status: Secondary | ICD-10-CM | POA: Diagnosis not present

## 2024-08-19 DIAGNOSIS — C50511 Malignant neoplasm of lower-outer quadrant of right female breast: Secondary | ICD-10-CM | POA: Diagnosis not present

## 2024-08-19 DIAGNOSIS — Z79811 Long term (current) use of aromatase inhibitors: Secondary | ICD-10-CM | POA: Diagnosis not present

## 2024-08-19 DIAGNOSIS — Z170 Estrogen receptor positive status [ER+]: Secondary | ICD-10-CM | POA: Diagnosis not present

## 2024-08-19 DIAGNOSIS — Z923 Personal history of irradiation: Secondary | ICD-10-CM | POA: Diagnosis not present

## 2024-08-19 NOTE — Progress Notes (Signed)
 Patient Care Team: Leslie Almarie SAUNDERS, MD as PCP - General (Family Medicine) Leslie Shoulders, MD as Consulting Physician (General Surgery) Leslie Rush, MD as Consulting Physician (Radiation Oncology) Leslie Potts, MD as Consulting Physician (Hematology and Oncology)  DIAGNOSIS:  Encounter Diagnosis  Name Primary?   Malignant neoplasm of lower-outer quadrant of right breast of female, estrogen receptor positive (HCC) Yes    SUMMARY OF ONCOLOGIC HISTORY: Oncology History  Malignant neoplasm of lower-outer quadrant of right breast of female, estrogen receptor positive (HCC)  01/20/2021 Cancer Staging   Staging form: Breast, AJCC 8th Edition - Clinical stage from 01/20/2021: Stage IA (cT1c, cN0, cM0, G2, ER+, PR-, HER2-) - Signed by Leslie Sandria BROCKS, MD on 01/25/2021 Stage prefix: Initial diagnosis Histologic grading system: 3 grade system   01/23/2021 Initial Diagnosis   Malignant neoplasm of lower-outer quadrant of right breast of female, estrogen receptor positive (HCC)   02/10/2021 - 06/17/2021 Chemotherapy   Patient is on Treatment Plan : BREAST Pembrolizumab  + Carboplatin  D1 + Paclitaxel  D1,8,15 q21d X 4 cycles / Pembrolizumab  + AC q21d x 4 cycles      Genetic Testing   Negative genetic testing. No pathogenic variants identified on the [facility] Multi-Cancer Panel+RNA. VUS in ATM called c.1132A>G identified. The report date is 02/13/2021.  The Multi-Cancer Panel + RNA offered by [facility] includes sequencing and/or deletion duplication testing of the following 84 genes: AIP, ALK, APC, ATM, AXIN2,BAP1,  BARD1, BLM, BMPR1A, BRCA1, BRCA2, BRIP1, CASR, CDC73, CDH1, CDK4, CDKN1B, CDKN1C, CDKN2A (p14ARF), CDKN2A (p16INK4a), CEBPA, CHEK2, CTNNA1, DICER1, DIS3L2, EGFR (c.2369C>T, p.Thr790Met variant only), EPCAM (Deletion/duplication testing only), FH, FLCN, GATA2, GPC3, GREM1 (Promoter region deletion/duplication testing only), HOXB13 (c.251G>A, p.Gly84Glu), HRAS, KIT, MAX, MEN1, MET, MITF  (c.952G>A, p.Glu318Lys variant only), MLH1, MSH2, MSH3, MSH6, MUTYH, NBN, NF1, NF2, NTHL1, PALB2, PDGFRA, PHOX2B, PMS2, POLD1, POLE, POT1, PRKAR1A, PTCH1, PTEN, RAD50, RAD51C, RAD51D, RB1, RECQL4, RET, RUNX1, SDHAF2, SDHA (sequence changes only), SDHB, SDHC, SDHD, SMAD4, SMARCA4, SMARCB1, SMARCE1, STK11, SUFU, TERC, TERT, TMEM127, TP53, TSC1, TSC2, VHL, WRN and WT1.   08/08/2021 Surgery   She proceeded to bilateral mastectomies on 08/08/2021 under Dr. Aron. Pathology from the procedure [id number]) showed: 1. BREAST, RIGHT, MASTECTOMY:  -  Focal residual invasive ductal carcinoma, status post neoadjuvant  therapy  -  Extensive sclerosing adenosis with associated calcifications  -  Sclerotic intraductal papilloma  -  Previous biopsy site changes present  -  See oncology table and comment below  2. LYMPH NODE, RIGHT AXILLARY, SENTINEL, EXCISION:  -  No carcinoma identified (0/2)  3. BREAST, LEFT, MASTECTOMY:  -  Residual multifocal invasive ductal carcinoma, status post  neoadjuvant therapy  -  Ductal carcinoma in-situ, high grade  -  Calcifications associated with carcinoma  -  Margins uninvolved by carcinoma (0.2 cm; anterior margin)  -  No carcinoma identified in one lymph node (0/1)  -  Lymphovascular space invasion present  -  Previous biopsy site changes present  -  See oncology table below  4. LYMPH NODE, LEFT AXILLARY, SENTINEL, EXCISION:  -  No carcinoma identified (0/4)     10/05/2021 - 11/27/2021 Radiation Therapy   10/05/2021 through 11/27/2021 Site Technique Total Dose (Gy) Dose per Fx (Gy) Completed Fx Beam Energies  Chest Wall, Left: CW_L 3D 50.4/50.4 1.8 28/28 10X  Chest Wall, Left: CW_L_SCLV 3D 50.4/50.4 1.8 28/28 6X, 10X  Chest Wall, Left: CW_L_Bst specialPort 10/10 2 5/5 9E     12/12/2021 -  Chemotherapy   Xeloda  500mg , 2 tablets  twice a day 14 days on, 7 days off x 6 months   06/2022 -  Anti-estrogen oral therapy   Anastrozole  switched to letrozole  08/20/2023 (due  to diarrhea)   Malignant neoplasm of overlapping sites of left breast in female, estrogen receptor negative (HCC)  01/20/2021 Cancer Staging   Staging form: Breast, AJCC 8th Edition - Clinical stage from 01/20/2021: Stage IIIB (cT3, cN0, cM0, G3, ER-, PR-, HER2-) - Signed by Leslie Sandria BROCKS, MD on 01/25/2021 Histologic grading system: 3 grade system   02/10/2021 - 06/17/2021 Chemotherapy   Patient is on Treatment Plan : BREAST Pembrolizumab  + Carboplatin  D1 + Paclitaxel  D1,8,15 q21d X 4 cycles / Pembrolizumab  + AC q21d x 4 cycles      Genetic Testing   Negative genetic testing. No pathogenic variants identified on the [facility] Multi-Cancer Panel+RNA. VUS in ATM called c.1132A>G identified. The report date is 02/13/2021.  The Multi-Cancer Panel + RNA offered by [facility] includes sequencing and/or deletion duplication testing of the following 84 genes: AIP, ALK, APC, ATM, AXIN2,BAP1,  BARD1, BLM, BMPR1A, BRCA1, BRCA2, BRIP1, CASR, CDC73, CDH1, CDK4, CDKN1B, CDKN1C, CDKN2A (p14ARF), CDKN2A (p16INK4a), CEBPA, CHEK2, CTNNA1, DICER1, DIS3L2, EGFR (c.2369C>T, p.Thr790Met variant only), EPCAM (Deletion/duplication testing only), FH, FLCN, GATA2, GPC3, GREM1 (Promoter region deletion/duplication testing only), HOXB13 (c.251G>A, p.Gly84Glu), HRAS, KIT, MAX, MEN1, MET, MITF (c.952G>A, p.Glu318Lys variant only), MLH1, MSH2, MSH3, MSH6, MUTYH, NBN, NF1, NF2, NTHL1, PALB2, PDGFRA, PHOX2B, PMS2, POLD1, POLE, POT1, PRKAR1A, PTCH1, PTEN, RAD50, RAD51C, RAD51D, RB1, RECQL4, RET, RUNX1, SDHAF2, SDHA (sequence changes only), SDHB, SDHC, SDHD, SMAD4, SMARCA4, SMARCB1, SMARCE1, STK11, SUFU, TERC, TERT, TMEM127, TP53, TSC1, TSC2, VHL, WRN and WT1.   07/28/2021 - 09/07/2021 Chemotherapy   Patient is on Treatment Plan : HEAD/NECK Pembrolizumab  Q21D     08/08/2021 Surgery   She proceeded to bilateral mastectomies on 08/08/2021 under Dr. Aron. Pathology from the procedure [id number]) showed: 1. BREAST, RIGHT, MASTECTOMY:   -  Focal residual invasive ductal carcinoma, status post neoadjuvant  therapy  -  Extensive sclerosing adenosis with associated calcifications  -  Sclerotic intraductal papilloma  -  Previous biopsy site changes present  -  See oncology table and comment below  2. LYMPH NODE, RIGHT AXILLARY, SENTINEL, EXCISION:  -  No carcinoma identified (0/2)  3. BREAST, LEFT, MASTECTOMY:  -  Residual multifocal invasive ductal carcinoma, status post  neoadjuvant therapy  -  Ductal carcinoma in-situ, high grade  -  Calcifications associated with carcinoma  -  Margins uninvolved by carcinoma (0.2 cm; anterior margin)  -  No carcinoma identified in one lymph node (0/1)  -  Lymphovascular space invasion present  -  Previous biopsy site changes present  -  See oncology table below  4. LYMPH NODE, LEFT AXILLARY, SENTINEL, EXCISION:  -  No carcinoma identified (0/4)     10/05/2021 - 11/27/2021 Radiation Therapy   10/05/2021 through 11/27/2021 Site Technique Total Dose (Gy) Dose per Fx (Gy) Completed Fx Beam Energies  Chest Wall, Left: CW_L 3D 50.4/50.4 1.8 28/28 10X  Chest Wall, Left: CW_L_SCLV 3D 50.4/50.4 1.8 28/28 6X, 10X  Chest Wall, Left: CW_L_Bst specialPort 10/10 2 5/5 9E     12/12/2021 -  Chemotherapy   Xeloda  500mg , 2 tablets twice a day 14 days on, 7 days off x 6 months     CHIEF COMPLIANT:   HISTORY OF PRESENT ILLNESS: History of Present Illness Leslie Wright is a 58 year old female who presents for follow-up after discontinuation of Mounjaro .  She has lost significant weight, now weighing 253 pounds, down from 280 pounds, due to increased physical activity. Her energy levels have improved, and she no longer experiences shortness of breath.  Mounjaro  was discontinued two weeks ago after her A1c levels dropped to 5.4. She is concerned about potential weight regain and has started phentermine  for appetite control. She takes letrozole  twice a week, having previously experienced  fatigue and diarrhea with daily use, which she associates with prior Mounjaro  use. Since stopping Mounjaro , diarrhea has resolved.  Her family history includes a great nephew who had leukemia and was hospitalized last year.     ALLERGIES:  has no known allergies.  MEDICATIONS:  Current Outpatient Medications  Medication Sig Dispense Refill   acetaminophen  (TYLENOL ) 500 MG tablet Take 1,000 mg by mouth every 6 (six) hours as needed for moderate pain, mild pain or headache.     aspirin  EC 81 MG tablet Take 81 mg by mouth daily.     carvedilol  (COREG ) 3.125 MG tablet Take 1 tablet (3.125 mg total) by mouth 2 (two) times daily with food for high blood pressure. 180 tablet 3   doxycycline  (VIBRAMYCIN ) 50 MG capsule Take 1 capsule (50 mg total) by mouth daily. Take with food. Use sun protection. 90 capsule 0   letrozole  (FEMARA ) 2.5 MG tablet Take 1 tablet (2.5 mg total) by mouth daily. 90 tablet 3   loperamide  (IMODIUM ) 2 MG capsule Take by mouth as needed for diarrhea or loose stools. Take 2 tabs (4 mg) with first loose stool, then 1 tab (2 mg) with each additional loose stool. Do not take more than 8 tabs (16 mg) in a 24-hour period.     metroNIDAZOLE  (METROCREAM ) 0.75 % cream Apply dime-size amount as directed to face twice a day. Taper use to daily once improved. 45 g 3   phentermine  15 MG capsule Take 1 capsule (15 mg total) by mouth in the morning. 30 capsule 0   rosuvastatin  (CRESTOR ) 10 MG tablet Take 1 tablet (10 mg total) by mouth daily at bedtime for cholesterol. 90 tablet 3   No current facility-administered medications for this visit.    PHYSICAL EXAMINATION: ECOG PERFORMANCE STATUS: 1 - Symptomatic but completely ambulatory  Vitals:   08/19/24 1511  BP: 139/79  Pulse: 72  Resp: 18  Temp: 98.5 F (36.9 C)  SpO2: 99%   Filed Weights   08/19/24 1511  Weight: 253 lb 14.4 oz (115.2 kg)    Physical Exam MEASUREMENTS: Weight- 253.  (exam performed in the presence of a  chaperone)  LABORATORY DATA:  I have reviewed the data as listed    Latest Ref Rng & Units 01/15/2023    3:54 PM 06/13/2022    1:58 PM 05/09/2022   12:51 PM  CMP  Glucose 70 - 99 mg/dL 673  857  888   BUN 6 - 20 mg/dL 19  16  19    Creatinine 0.44 - 1.00 mg/dL 8.63  8.71  8.63   Sodium 135 - 145 mmol/L 138  139  137   Potassium 3.5 - 5.1 mmol/L 3.8  3.9  3.9   Chloride 98 - 111 mmol/L 106  105  106   CO2 22 - 32 mmol/L 23  24  24    Calcium  8.9 - 10.3 mg/dL 9.7  9.1  8.9   Total Protein 6.5 - 8.1 g/dL 7.2  7.3  7.6   Total Bilirubin 0.3 - 1.2 mg/dL 0.3  0.2  0.3  Alkaline Phos 38 - 126 U/L 92  68  72   AST 15 - 41 U/L 16  15  11    ALT 0 - 44 U/L 20  12  10      Lab Results  Component Value Date   WBC 6.9 01/15/2023   HGB 12.8 01/15/2023   HCT 37.8 01/15/2023   MCV 90.2 01/15/2023   PLT 196 01/15/2023   NEUTROABS 4.7 01/15/2023    ASSESSMENT & PLAN:  Malignant neoplasm of lower-outer quadrant of right breast of female, estrogen receptor positive (HCC) right breast, a clinical T2 N0-1, stage IIA-IIB invasive ductal carcinoma, grade 2, estrogen receptor positive, progesterone receptor and HER2 negative, with an MIB-1 of 5%   left breast, a clinical T3 N0, stage IIIB invasive ductal carcinoma, grade 3, functionally triple negative, with an MIB-1 of 25% (HER2 results are pending).   neoadjuvant chemotherapy started 02/09/2021 consisting of pembrolizumab / carboplatin / paclitaxel  stopped after 3 cycles due to peripheral neuropathy followed by pembrolizumab / doxorubicin / cyclophosphamide  x4 started 04/13/2021, completed on 06/15/2021   Bilateral Mastectomies: 08/08/2021: Bilateral mastectomies. Right mastectomy: Focal residual IDC 01 6 cm, 0/2 lymph nodes negative, ER 90%, PR 0%, HER2 negative, Ki-67 5% Left mastectomy: Residual multifocal IDC 7 cm with DCIS margins -0/5 lymph nodes negative, ER weak 5%, PR 0%, HER2 negative, Ki-67 25%   Adjuvant radiation completed 11/27/2021    Treatment plan:  Adjuvant capecitabine  1000 mg p.o. twice daily 14 days on 7 days off for 6 months beginning March 2023 completed September 2023 2. antiestrogens for the right-sided breast cancer: Start September 2023 ------------------------------------------------------------- Treatment plan: Antiestrogen therapy with anastrozole  1 mg daily x5 to 7 years started 06/13/2022, switched to letrozole  08/20/2023 due to diarrhea  Letrozole  toxicities: Diarrhea continues to be intermittent.  She is now taking it twice a week.  I encouraged her to take it 3 times a week if she can tolerate it.   Breast cancer surveillance: 1.  CT CAP 07/15/2022: No metastatic disease.  Tiny left upper lung nodules from radiation.  Hepatic steatosis. 2. no role of mammograms since patient had bilateral mastectomies 3.  08/19/2024: Physical examination: No palpable lumps or nodules   Patient lost 30 pounds on Mounjaro  but recently she was taken off the medication and was placed on phentermine.   Return to clinic in 1 year for follow-up    No orders of the defined types were placed in this encounter.  The patient has a good understanding of the overall plan. she agrees with it. she will call with any problems that may develop before the next visit here.  I personally spent a total of 30 minutes in the care of the patient today including preparing to see the patient, getting/reviewing separately obtained history, performing a medically appropriate exam/evaluation, counseling and educating, placing orders, referring and communicating with other health care professionals, documenting clinical information in the EHR, independently interpreting results, communicating results, and coordinating care.   Viinay K Oriana Horiuchi, MD 08/19/24

## 2024-08-19 NOTE — Assessment & Plan Note (Signed)
 right breast, a clinical T2 N0-1, stage IIA-IIB invasive ductal carcinoma, grade 2, estrogen receptor positive, progesterone receptor and HER2 negative, with an MIB-1 of 5%   left breast, a clinical T3 N0, stage IIIB invasive ductal carcinoma, grade 3, functionally triple negative, with an MIB-1 of 25% (HER2 results are pending).   neoadjuvant chemotherapy started 02/09/2021 consisting of pembrolizumab / carboplatin / paclitaxel  stopped after 3 cycles due to peripheral neuropathy followed by pembrolizumab / doxorubicin / cyclophosphamide  x4 started 04/13/2021, completed on 06/15/2021   Bilateral Mastectomies: 08/08/2021: Bilateral mastectomies. Right mastectomy: Focal residual IDC 01 6 cm, 0/2 lymph nodes negative, ER 90%, PR 0%, HER2 negative, Ki-67 5% Left mastectomy: Residual multifocal IDC 7 cm with DCIS margins -0/5 lymph nodes negative, ER weak 5%, PR 0%, HER2 negative, Ki-67 25%   Adjuvant radiation completed 11/27/2021   Treatment plan:  Adjuvant capecitabine  1000 mg p.o. twice daily 14 days on 7 days off for 6 months beginning March 2023 completed September 2023 2. antiestrogens for the right-sided breast cancer: Start September 2023 ------------------------------------------------------------- Treatment plan: Antiestrogen therapy with anastrozole  1 mg daily x5 to 7 years started 06/13/2022, switched to letrozole  08/20/2023 due to diarrhea  Letrozole  toxicities:   Breast cancer surveillance: 1.  CT CAP 07/15/2022: No metastatic disease.  Tiny left upper lung nodules from radiation.  Hepatic steatosis. 2. no role of mammograms since patient had bilateral mastectomies 3.  08/19/2024: Physical examination: No palpable lumps or nodules   Patient lost 20 pounds on Mounjaro  Patient will call us  if she cannot tolerate letrozole  so that we can switch her to exemestane. Return to clinic in 1 year for follow-up

## 2024-08-28 ENCOUNTER — Other Ambulatory Visit: Payer: Self-pay

## 2024-08-30 LAB — SIGNATERA
SIGNATERA MTM READOUT: 0 MTM/ml
SIGNATERA TEST RESULT: NEGATIVE

## 2024-09-01 DIAGNOSIS — E1165 Type 2 diabetes mellitus with hyperglycemia: Secondary | ICD-10-CM | POA: Diagnosis not present

## 2024-09-08 ENCOUNTER — Other Ambulatory Visit: Payer: Self-pay | Admitting: Hematology and Oncology

## 2024-09-08 ENCOUNTER — Other Ambulatory Visit (HOSPITAL_COMMUNITY): Payer: Self-pay

## 2024-09-08 ENCOUNTER — Other Ambulatory Visit: Payer: Self-pay

## 2024-09-08 MED ORDER — PHENTERMINE HCL 15 MG PO CAPS
15.0000 mg | ORAL_CAPSULE | Freq: Every morning | ORAL | 0 refills | Status: DC
  Filled 2024-09-08: qty 30, 30d supply, fill #0

## 2024-09-08 MED ORDER — LETROZOLE 2.5 MG PO TABS
2.5000 mg | ORAL_TABLET | Freq: Every day | ORAL | 3 refills | Status: AC
  Filled 2024-09-08: qty 90, 90d supply, fill #0

## 2024-10-06 ENCOUNTER — Other Ambulatory Visit (HOSPITAL_COMMUNITY): Payer: Self-pay

## 2024-10-06 MED ORDER — PHENTERMINE HCL 15 MG PO CAPS
15.0000 mg | ORAL_CAPSULE | Freq: Every morning | ORAL | 0 refills | Status: AC
  Filled 2024-10-07: qty 30, 30d supply, fill #0

## 2024-10-07 ENCOUNTER — Other Ambulatory Visit (HOSPITAL_COMMUNITY): Payer: Self-pay

## 2024-10-07 ENCOUNTER — Other Ambulatory Visit (HOSPITAL_BASED_OUTPATIENT_CLINIC_OR_DEPARTMENT_OTHER): Payer: Self-pay

## 2024-10-29 ENCOUNTER — Other Ambulatory Visit: Payer: Self-pay

## 2025-08-19 ENCOUNTER — Inpatient Hospital Stay: Admitting: Hematology and Oncology
